# Patient Record
Sex: Female | Born: 1950
Health system: Southern US, Community
[De-identification: ages and names within clinical notes are randomized; demographics above are authoritative.]

## PROBLEM LIST (undated history)

## (undated) DIAGNOSIS — I10 Essential (primary) hypertension: Secondary | ICD-10-CM

## (undated) DIAGNOSIS — F419 Anxiety disorder, unspecified: Secondary | ICD-10-CM

## (undated) DIAGNOSIS — K219 Gastro-esophageal reflux disease without esophagitis: Secondary | ICD-10-CM

## (undated) DIAGNOSIS — G709 Myoneural disorder, unspecified: Secondary | ICD-10-CM

## (undated) DIAGNOSIS — F32A Depression, unspecified: Secondary | ICD-10-CM

## (undated) DIAGNOSIS — K589 Irritable bowel syndrome without diarrhea: Secondary | ICD-10-CM

## (undated) DIAGNOSIS — F329 Major depressive disorder, single episode, unspecified: Secondary | ICD-10-CM

## (undated) DIAGNOSIS — E119 Type 2 diabetes mellitus without complications: Secondary | ICD-10-CM

## (undated) DIAGNOSIS — I4891 Unspecified atrial fibrillation: Secondary | ICD-10-CM

## (undated) HISTORY — PX: SPINE SURGERY: SHX786

## (undated) HISTORY — DX: Major depressive disorder, single episode, unspecified: F32.9

## (undated) HISTORY — PX: CHOLECYSTECTOMY: SHX55

## (undated) HISTORY — DX: Essential (primary) hypertension: I10

## (undated) HISTORY — PX: TUBAL LIGATION: SHX77

## (undated) HISTORY — DX: Myoneural disorder, unspecified: G70.9

## (undated) HISTORY — DX: Gastro-esophageal reflux disease without esophagitis: K21.9

## (undated) HISTORY — PX: BREAST EXCISIONAL BIOPSY: SUR124

## (undated) HISTORY — DX: Depression, unspecified: F32.A

## (undated) HISTORY — DX: Anxiety disorder, unspecified: F41.9

## (undated) MED FILL — Losartan Potassium Tab 25 MG: ORAL | Fill #0 | Status: CN

## (undated) MED FILL — Pantoprazole Sodium EC Tab 40 MG (Base Equiv): ORAL | Fill #0 | Status: CN

## (undated) MED FILL — Metformin HCl Tab ER 24HR 500 MG: ORAL | Fill #0 | Status: CN

---

## 1971-04-17 HISTORY — PX: EXCISION / BIOPSY BREAST / NIPPLE / DUCT: SUR469

## 2005-12-05 ENCOUNTER — Ambulatory Visit: Payer: Self-pay

## 2006-07-17 ENCOUNTER — Ambulatory Visit: Payer: Self-pay | Admitting: Internal Medicine

## 2006-08-15 ENCOUNTER — Ambulatory Visit: Payer: Self-pay | Admitting: Internal Medicine

## 2006-10-16 ENCOUNTER — Ambulatory Visit: Payer: Self-pay | Admitting: Family Medicine

## 2006-11-15 ENCOUNTER — Ambulatory Visit: Payer: Self-pay | Admitting: Family Medicine

## 2007-04-29 ENCOUNTER — Ambulatory Visit: Payer: Self-pay | Admitting: Internal Medicine

## 2008-07-02 ENCOUNTER — Emergency Department: Payer: Self-pay | Admitting: Emergency Medicine

## 2008-11-11 ENCOUNTER — Ambulatory Visit: Payer: Self-pay | Admitting: Unknown Physician Specialty

## 2012-03-28 ENCOUNTER — Ambulatory Visit: Payer: Self-pay

## 2012-04-02 ENCOUNTER — Ambulatory Visit: Payer: Self-pay | Admitting: Internal Medicine

## 2012-07-16 ENCOUNTER — Other Ambulatory Visit: Payer: Self-pay

## 2012-07-16 LAB — URINALYSIS, COMPLETE
Bilirubin,UR: NEGATIVE
Glucose,UR: NEGATIVE mg/dL (ref 0–75)
Ketone: NEGATIVE
Nitrite: NEGATIVE
Ph: 6 (ref 4.5–8.0)
Protein: NEGATIVE
Specific Gravity: 1.017 (ref 1.003–1.030)

## 2012-07-16 LAB — CBC WITH DIFFERENTIAL/PLATELET
Basophil %: 0.8 %
Eosinophil #: 0.2 10*3/uL (ref 0.0–0.7)
Eosinophil %: 1.8 %
HCT: 43 % (ref 35.0–47.0)
HGB: 14.3 g/dL (ref 12.0–16.0)
Lymphocyte #: 1.9 10*3/uL (ref 1.0–3.6)
MCH: 29.8 pg (ref 26.0–34.0)
MCV: 90 fL (ref 80–100)
Monocyte %: 11.1 %
Neutrophil #: 5.8 10*3/uL (ref 1.4–6.5)
Neutrophil %: 64.9 %
Platelet: 276 10*3/uL (ref 150–440)
WBC: 8.9 10*3/uL (ref 3.6–11.0)

## 2012-07-16 LAB — COMPREHENSIVE METABOLIC PANEL
Albumin: 3 g/dL — ABNORMAL LOW (ref 3.4–5.0)
Alkaline Phosphatase: 67 U/L (ref 50–136)
Bilirubin,Total: 0.4 mg/dL (ref 0.2–1.0)
Calcium, Total: 9 mg/dL (ref 8.5–10.1)
Chloride: 101 mmol/L (ref 98–107)
Co2: 24 mmol/L (ref 21–32)
Creatinine: 0.82 mg/dL (ref 0.60–1.30)
EGFR (African American): >60
EGFR (Non-African Amer.): >60
Glucose: 240 mg/dL — ABNORMAL HIGH (ref 65–99)
Potassium: 3.6 mmol/L (ref 3.5–5.1)
SGPT (ALT): 81 U/L — ABNORMAL HIGH (ref 12–78)

## 2012-07-17 ENCOUNTER — Ambulatory Visit: Payer: Self-pay

## 2012-07-17 LAB — OCCULT BLOOD X 1 CARD TO LAB, STOOL: Occult Blood, Feces: NEGATIVE

## 2012-07-19 LAB — STOOL CULTURE

## 2013-05-15 ENCOUNTER — Ambulatory Visit: Payer: Self-pay

## 2013-08-03 ENCOUNTER — Other Ambulatory Visit: Payer: Self-pay

## 2013-08-03 LAB — COMPREHENSIVE METABOLIC PANEL
ALBUMIN: 3.5 g/dL (ref 3.4–5.0)
ALT: 42 U/L (ref 12–78)
AST: 45 U/L — AB (ref 15–37)
Alkaline Phosphatase: 60 U/L
Anion Gap: 5 — ABNORMAL LOW (ref 7–16)
BILIRUBIN TOTAL: 0.4 mg/dL (ref 0.2–1.0)
BUN: 10 mg/dL (ref 7–18)
CALCIUM: 8.8 mg/dL (ref 8.5–10.1)
CHLORIDE: 100 mmol/L (ref 98–107)
CREATININE: 0.7 mg/dL (ref 0.60–1.30)
Co2: 29 mmol/L (ref 21–32)
EGFR (African American): >60
EGFR (Non-African Amer.): >60
Glucose: 135 mg/dL — ABNORMAL HIGH (ref 65–99)
Osmolality: 269 (ref 275–301)
POTASSIUM: 3.5 mmol/L (ref 3.5–5.1)
SODIUM: 134 mmol/L — AB (ref 136–145)
TOTAL PROTEIN: 7.9 g/dL (ref 6.4–8.2)

## 2013-08-03 LAB — CBC WITH DIFFERENTIAL/PLATELET
Basophil #: 0.1 10*3/uL (ref 0.0–0.1)
Basophil %: 1 %
EOS PCT: 0.2 %
Eosinophil #: 0 10*3/uL (ref 0.0–0.7)
HCT: 42.8 % (ref 35.0–47.0)
HGB: 14.2 g/dL (ref 12.0–16.0)
Lymphocyte #: 1.5 10*3/uL (ref 1.0–3.6)
Lymphocyte %: 16.4 %
MCH: 29.7 pg (ref 26.0–34.0)
MCHC: 33.2 g/dL (ref 32.0–36.0)
MCV: 90 fL (ref 80–100)
MONO ABS: 1 x10 3/mm — AB (ref 0.2–0.9)
Monocyte %: 10.5 %
NEUTROS PCT: 71.9 %
Neutrophil #: 6.7 10*3/uL — ABNORMAL HIGH (ref 1.4–6.5)
Platelet: 154 10*3/uL (ref 150–440)
RBC: 4.78 10*6/uL (ref 3.80–5.20)
RDW: 13 % (ref 11.5–14.5)
WBC: 9.3 10*3/uL (ref 3.6–11.0)

## 2014-02-11 ENCOUNTER — Ambulatory Visit: Payer: Self-pay | Admitting: Unknown Physician Specialty

## 2014-08-09 LAB — SURGICAL PATHOLOGY

## 2014-12-17 DIAGNOSIS — E1165 Type 2 diabetes mellitus with hyperglycemia: Secondary | ICD-10-CM | POA: Diagnosis not present

## 2014-12-17 DIAGNOSIS — Z0001 Encounter for general adult medical examination with abnormal findings: Secondary | ICD-10-CM | POA: Diagnosis not present

## 2014-12-17 DIAGNOSIS — E559 Vitamin D deficiency, unspecified: Secondary | ICD-10-CM | POA: Diagnosis not present

## 2014-12-21 DIAGNOSIS — F329 Major depressive disorder, single episode, unspecified: Secondary | ICD-10-CM | POA: Diagnosis not present

## 2014-12-21 DIAGNOSIS — I10 Essential (primary) hypertension: Secondary | ICD-10-CM | POA: Diagnosis not present

## 2014-12-21 DIAGNOSIS — E1165 Type 2 diabetes mellitus with hyperglycemia: Secondary | ICD-10-CM | POA: Diagnosis not present

## 2014-12-21 DIAGNOSIS — B373 Candidiasis of vulva and vagina: Secondary | ICD-10-CM | POA: Diagnosis not present

## 2014-12-21 DIAGNOSIS — B370 Candidal stomatitis: Secondary | ICD-10-CM | POA: Diagnosis not present

## 2014-12-22 ENCOUNTER — Other Ambulatory Visit: Payer: Self-pay | Admitting: Nurse Practitioner

## 2014-12-22 DIAGNOSIS — Z1231 Encounter for screening mammogram for malignant neoplasm of breast: Secondary | ICD-10-CM

## 2014-12-23 ENCOUNTER — Ambulatory Visit: Payer: Self-pay | Admitting: *Deleted

## 2014-12-28 ENCOUNTER — Encounter: Payer: Self-pay | Admitting: Dietician

## 2014-12-28 ENCOUNTER — Encounter: Payer: Medicare Other | Attending: Nurse Practitioner | Admitting: Dietician

## 2014-12-28 VITALS — BP 128/86 | Ht 65.0 in | Wt 239.5 lb

## 2014-12-28 DIAGNOSIS — E119 Type 2 diabetes mellitus without complications: Secondary | ICD-10-CM

## 2014-12-28 NOTE — Progress Notes (Signed)
Diabetes Self-Management Education  Visit Type: First/Initial  Appt. Start Time:1030  Appt. End Time: 2993  12/28/2014  [name], identified by name and date of birth, is a 64 y.o. female with a diagnosis of Diabetes: Type 2.   ASSESSMENT   Pt is obese  Pt states that she "feels like getting diabetes is all her fault" -pt became teary   Reports that her husband is not supportive and blames her for getting diabetes  c/o blurred vision which is improving slightly  c/o constant numbness in bottoms of both feet  Reports having GI upset since starting Janumet which is resolving some-takes Janumet without food  Blood pressure 128/86, height 5\' 5"  (1.651 m), weight 239 lb 8 oz (108.636 kg). Body mass index is 39.85 kg/(m^2).      Diabetes Self-Management Education - 12/28/14 1416    Visit Information   Visit Type First/Initial   Initial Visit   Diabetes Type Type 2   Health Coping   How would you rate your overall health? Fair   Psychosocial Assessment   Patient Belief/Attitude about Diabetes Motivated to manage diabetes   Self-care barriers None   Other persons present Patient   Patient Concerns Nutrition/Meal planning;Medication;Weight Control;Glycemic Control;Healthy Lifestyle   Special Needs None   Preferred Learning Style Hands on   Learning Readiness Ready   What is the last grade level you completed in school? 12   Complications   How often do you check your blood sugar? --  1x/day-fasting   Fasting Blood glucose range (mg/dL) 70-129;130-179  120's-150's   Have you had a dilated eye exam in the past 12 months? No   Have you had a dental exam in the past 12 months? Yes   Are you checking your feet? Yes   How many days per week are you checking your feet? 2   Dietary Intake   Breakfast --  eats breakfast at 10a-eats fried foods and snack foods 2-3x/wk.   Lunch --  occasionally skips lunch   Snack (afternoon) --  eats popcorn (skinny pop) about 2p   Beverage(s) --  drinks 2 glasses of fruit juices daily    Exercise   Exercise Type ADL's  exercise limited due to recent  right knee injury causing pain    Patient Education   Previous Diabetes Education No   Disease state  Definition of diabetes, type 1 and 2, and the diagnosis of diabetes;Factors that contribute to the development of diabetes   Nutrition management  Role of diet in the treatment of diabetes and the relationship between the three main macronutrients and blood glucose level;Food label reading, portion sizes and measuring food.;Carbohydrate counting;Meal timing in regards to the patients' current diabetes medication.   Physical activity and exercise  Role of exercise on diabetes management, blood pressure control and cardiac health.   Medications Reviewed patients medication for diabetes, action, purpose, timing of dose and side effects.   Monitoring Purpose and frequency of SMBG.;Taught/discussed recording of test results and interpretation of SMBG.;Yearly dilated eye exam;Identified appropriate SMBG and/or A1C goals.   Chronic complications Relationship between chronic complications and blood glucose control;Dental care;Retinopathy and reason for yearly dilated eye exams   Psychosocial adjustment Role of stress on diabetes   Personal strategies to promote health Lifestyle issues that need to be addressed for better diabetes care      Individualized Plan for Diabetes Self-Management Training:   Learning Objective:  Patient will have a greater understanding of diabetes self-management. Patient  education plan is to attend individual and group sessions per assessed needs and concerns.    Plan:   Patient Instructions   Check blood sugars 2 x day before breakfast and 2 hrs after supper every day and record  Exercise: try water aerobics if able  Avoid sugar sweetened drinks (soda, tea, coffee, sports drinks, fruit juices)-try Healthy Balance drinks  Eat 3 meals day,   1   snack a day at bedtime  Space meals 4-6 hours apart  Make an eye doctor appointment once vision is stabilized  Bring blood sugar records to the next appointment/class  Get a Sharps container  Return for appointment/classes on:  01-17-15  Take Janumet with breakfast and supper  Call [facility] to schedule appt. with a counselor   Call if questions arise  Expected Outcomes:     Education material provided: General Meal Planning guidelines; pt requests appt. with a counselor and was given phone number for [facility] (also notified [facility])  If problems or questions, patient to contact team via:  [phone]  Future DSME appointment:  01-17-15

## 2014-12-28 NOTE — Patient Instructions (Signed)
  Check blood sugars 2 x day before breakfast and 2 hrs after supper every day and record  Exercise: try water aerobics if able  Avoid sugar sweetened drinks (soda, tea, coffee, sports drinks, fruit juices)-try Healthy Balance apple drink  Eat 3 meals day,   1  snacks a day at bedtime  Space meals 4-6 hours apart  Make an eye doctor appointment  Bring blood sugar records to the next appointment/class  Get a Sharps container  Return for appointment/classes on:  01-17-15

## 2014-12-30 ENCOUNTER — Ambulatory Visit: Payer: Self-pay

## 2015-01-04 DIAGNOSIS — H53009 Unspecified amblyopia, unspecified eye: Secondary | ICD-10-CM | POA: Diagnosis not present

## 2015-01-04 DIAGNOSIS — Z0000 Encounter for general adult medical examination without abnormal findings: Secondary | ICD-10-CM | POA: Diagnosis not present

## 2015-01-04 DIAGNOSIS — F411 Generalized anxiety disorder: Secondary | ICD-10-CM | POA: Diagnosis not present

## 2015-01-04 DIAGNOSIS — F329 Major depressive disorder, single episode, unspecified: Secondary | ICD-10-CM | POA: Diagnosis not present

## 2015-01-04 DIAGNOSIS — Z0001 Encounter for general adult medical examination with abnormal findings: Secondary | ICD-10-CM | POA: Diagnosis not present

## 2015-01-04 DIAGNOSIS — E1165 Type 2 diabetes mellitus with hyperglycemia: Secondary | ICD-10-CM | POA: Diagnosis not present

## 2015-01-04 DIAGNOSIS — I10 Essential (primary) hypertension: Secondary | ICD-10-CM | POA: Diagnosis not present

## 2015-01-06 ENCOUNTER — Ambulatory Visit: Payer: Medicare Other

## 2015-01-17 ENCOUNTER — Encounter: Payer: Self-pay | Admitting: Dietician

## 2015-01-17 ENCOUNTER — Ambulatory Visit: Payer: Medicare Other

## 2015-01-17 NOTE — Progress Notes (Signed)
Pt called and cancelled class 1 tonight. Rescheduled classes beginning with class 1 on 01-27-15

## 2015-01-24 ENCOUNTER — Ambulatory Visit: Payer: Medicare Other

## 2015-01-27 ENCOUNTER — Encounter: Payer: Medicare Other | Attending: Nurse Practitioner | Admitting: Dietician

## 2015-01-27 VITALS — Ht 65.0 in | Wt 232.6 lb

## 2015-01-27 DIAGNOSIS — E119 Type 2 diabetes mellitus without complications: Secondary | ICD-10-CM | POA: Insufficient documentation

## 2015-01-27 NOTE — Progress Notes (Signed)

## 2015-01-31 ENCOUNTER — Ambulatory Visit: Payer: Medicare Other

## 2015-02-03 ENCOUNTER — Encounter: Payer: Medicare Other | Admitting: Dietician

## 2015-02-03 ENCOUNTER — Encounter: Payer: Self-pay | Admitting: Dietician

## 2015-02-03 ENCOUNTER — Ambulatory Visit: Payer: Medicare Other

## 2015-02-03 VITALS — Wt 231.4 lb

## 2015-02-03 DIAGNOSIS — E1140 Type 2 diabetes mellitus with diabetic neuropathy, unspecified: Secondary | ICD-10-CM

## 2015-02-03 DIAGNOSIS — E119 Type 2 diabetes mellitus without complications: Secondary | ICD-10-CM | POA: Diagnosis not present

## 2015-02-03 NOTE — Progress Notes (Addendum)
RD provided pt with diet  information on gastroparesis. Pt teary and reports feeling depressed and overwhelmed with diabetes-says her husband blames her for getting diabetes and is not supportive. Pt was given phone number for [facility] at initial visit but has not called. Encouraged pt to call [facility] and set up appointment for counseling.

## 2015-02-03 NOTE — Progress Notes (Signed)

## 2015-02-10 ENCOUNTER — Encounter: Payer: Medicare Other | Admitting: Dietician

## 2015-02-10 VITALS — Ht 65.0 in | Wt 232.1 lb

## 2015-02-10 DIAGNOSIS — E1140 Type 2 diabetes mellitus with diabetic neuropathy, unspecified: Secondary | ICD-10-CM

## 2015-02-10 DIAGNOSIS — E119 Type 2 diabetes mellitus without complications: Secondary | ICD-10-CM | POA: Diagnosis not present

## 2015-02-10 NOTE — Progress Notes (Signed)

## 2015-02-15 ENCOUNTER — Encounter: Payer: Self-pay | Admitting: Dietician

## 2015-02-18 DIAGNOSIS — E1165 Type 2 diabetes mellitus with hyperglycemia: Secondary | ICD-10-CM | POA: Diagnosis not present

## 2015-02-18 DIAGNOSIS — I10 Essential (primary) hypertension: Secondary | ICD-10-CM | POA: Diagnosis not present

## 2015-02-18 DIAGNOSIS — F411 Generalized anxiety disorder: Secondary | ICD-10-CM | POA: Diagnosis not present

## 2015-02-18 DIAGNOSIS — K649 Unspecified hemorrhoids: Secondary | ICD-10-CM | POA: Diagnosis not present

## 2015-02-18 DIAGNOSIS — F329 Major depressive disorder, single episode, unspecified: Secondary | ICD-10-CM | POA: Diagnosis not present

## 2015-03-02 ENCOUNTER — Ambulatory Visit: Payer: Medicare Other

## 2015-03-07 DIAGNOSIS — E119 Type 2 diabetes mellitus without complications: Secondary | ICD-10-CM | POA: Diagnosis not present

## 2015-03-15 DIAGNOSIS — E2839 Other primary ovarian failure: Secondary | ICD-10-CM | POA: Diagnosis not present

## 2015-03-15 DIAGNOSIS — M85832 Other specified disorders of bone density and structure, left forearm: Secondary | ICD-10-CM | POA: Diagnosis not present

## 2015-03-15 DIAGNOSIS — Z13820 Encounter for screening for osteoporosis: Secondary | ICD-10-CM | POA: Diagnosis not present

## 2015-03-15 DIAGNOSIS — M8588 Other specified disorders of bone density and structure, other site: Secondary | ICD-10-CM | POA: Diagnosis not present

## 2015-03-15 DIAGNOSIS — M85852 Other specified disorders of bone density and structure, left thigh: Secondary | ICD-10-CM | POA: Diagnosis not present

## 2015-03-15 DIAGNOSIS — M85862 Other specified disorders of bone density and structure, left lower leg: Secondary | ICD-10-CM | POA: Diagnosis not present

## 2015-03-15 DIAGNOSIS — N958 Other specified menopausal and perimenopausal disorders: Secondary | ICD-10-CM | POA: Diagnosis not present

## 2015-03-18 DIAGNOSIS — D2311 Other benign neoplasm of skin of right eyelid, including canthus: Secondary | ICD-10-CM | POA: Diagnosis not present

## 2015-03-23 ENCOUNTER — Ambulatory Visit: Payer: Medicare Other

## 2015-05-16 ENCOUNTER — Ambulatory Visit: Payer: Medicare Other

## 2015-05-19 ENCOUNTER — Other Ambulatory Visit: Payer: Self-pay | Admitting: Nurse Practitioner

## 2015-05-19 ENCOUNTER — Ambulatory Visit
Admission: RE | Admit: 2015-05-19 | Discharge: 2015-05-19 | Disposition: A | Discharge status: Home / Self Care | Payer: Medicare Other | Source: Ambulatory Visit | Attending: Nurse Practitioner | Admitting: Nurse Practitioner

## 2015-05-19 DIAGNOSIS — Z1231 Encounter for screening mammogram for malignant neoplasm of breast: Secondary | ICD-10-CM

## 2015-05-26 DIAGNOSIS — K5900 Constipation, unspecified: Secondary | ICD-10-CM | POA: Diagnosis not present

## 2015-05-26 DIAGNOSIS — E1165 Type 2 diabetes mellitus with hyperglycemia: Secondary | ICD-10-CM | POA: Diagnosis not present

## 2015-05-26 DIAGNOSIS — I10 Essential (primary) hypertension: Secondary | ICD-10-CM | POA: Diagnosis not present

## 2015-05-26 DIAGNOSIS — L630 Alopecia (capitis) totalis: Secondary | ICD-10-CM | POA: Diagnosis not present

## 2015-06-08 DIAGNOSIS — L630 Alopecia (capitis) totalis: Secondary | ICD-10-CM | POA: Diagnosis not present

## 2015-06-10 DIAGNOSIS — M170 Bilateral primary osteoarthritis of knee: Secondary | ICD-10-CM | POA: Diagnosis not present

## 2015-06-10 DIAGNOSIS — M25562 Pain in left knee: Secondary | ICD-10-CM | POA: Diagnosis not present

## 2015-07-14 DIAGNOSIS — E1165 Type 2 diabetes mellitus with hyperglycemia: Secondary | ICD-10-CM | POA: Diagnosis not present

## 2015-07-14 DIAGNOSIS — I10 Essential (primary) hypertension: Secondary | ICD-10-CM | POA: Diagnosis not present

## 2015-07-14 DIAGNOSIS — R10817 Generalized abdominal tenderness: Secondary | ICD-10-CM | POA: Diagnosis not present

## 2015-07-14 DIAGNOSIS — R197 Diarrhea, unspecified: Secondary | ICD-10-CM | POA: Diagnosis not present

## 2015-08-16 DIAGNOSIS — J301 Allergic rhinitis due to pollen: Secondary | ICD-10-CM | POA: Diagnosis not present

## 2015-08-16 DIAGNOSIS — J309 Allergic rhinitis, unspecified: Secondary | ICD-10-CM | POA: Diagnosis not present

## 2015-08-16 DIAGNOSIS — J019 Acute sinusitis, unspecified: Secondary | ICD-10-CM | POA: Diagnosis not present

## 2015-08-31 DIAGNOSIS — J309 Allergic rhinitis, unspecified: Secondary | ICD-10-CM | POA: Diagnosis not present

## 2015-08-31 DIAGNOSIS — J019 Acute sinusitis, unspecified: Secondary | ICD-10-CM | POA: Diagnosis not present

## 2016-01-25 DIAGNOSIS — M170 Bilateral primary osteoarthritis of knee: Secondary | ICD-10-CM | POA: Diagnosis not present

## 2016-01-25 DIAGNOSIS — M65332 Trigger finger, left middle finger: Secondary | ICD-10-CM | POA: Diagnosis not present

## 2016-02-01 DIAGNOSIS — M65332 Trigger finger, left middle finger: Secondary | ICD-10-CM | POA: Diagnosis not present

## 2016-02-03 DIAGNOSIS — R350 Frequency of micturition: Secondary | ICD-10-CM | POA: Diagnosis not present

## 2016-02-03 DIAGNOSIS — J019 Acute sinusitis, unspecified: Secondary | ICD-10-CM | POA: Diagnosis not present

## 2016-02-03 DIAGNOSIS — I10 Essential (primary) hypertension: Secondary | ICD-10-CM | POA: Diagnosis not present

## 2016-02-03 DIAGNOSIS — E1165 Type 2 diabetes mellitus with hyperglycemia: Secondary | ICD-10-CM | POA: Diagnosis not present

## 2016-02-21 ENCOUNTER — Ambulatory Visit: Payer: Medicare Other | Admitting: Dietician

## 2016-02-28 ENCOUNTER — Encounter: Payer: Self-pay | Admitting: Dietician

## 2016-02-28 ENCOUNTER — Encounter: Payer: Medicare Other | Attending: Nurse Practitioner | Admitting: Dietician

## 2016-02-28 VITALS — Ht 66.0 in | Wt 232.8 lb

## 2016-02-28 DIAGNOSIS — E669 Obesity, unspecified: Secondary | ICD-10-CM | POA: Insufficient documentation

## 2016-02-28 DIAGNOSIS — Z713 Dietary counseling and surveillance: Secondary | ICD-10-CM | POA: Diagnosis not present

## 2016-02-28 DIAGNOSIS — E1149 Type 2 diabetes mellitus with other diabetic neurological complication: Secondary | ICD-10-CM

## 2016-02-28 DIAGNOSIS — I10 Essential (primary) hypertension: Secondary | ICD-10-CM | POA: Diagnosis not present

## 2016-02-28 DIAGNOSIS — E119 Type 2 diabetes mellitus without complications: Secondary | ICD-10-CM | POA: Diagnosis not present

## 2016-02-28 NOTE — Progress Notes (Signed)
Diabetes Self-Management Education  Visit Type: First/Initial  Appt. Start Time: 1030 Appt. End Time: 1130  02/28/2016  [name], identified by name and date of birth, is a 65 y.o. female with a diagnosis of Diabetes: Type 2.   ASSESSMENT  Height 5\' 6"  (1.676 m), weight 232 lb 12.8 oz (105.6 kg). Body mass index is 37.57 kg/m. Overweight     Diabetes Self-Management Education - 02/28/16 1300      Visit Information   Visit Type First/Initial     Initial Visit   Diabetes Type Type 2     Health Coping   How would you rate your overall health? Good     Psychosocial Assessment   Patient Belief/Attitude about Diabetes Motivated to manage diabetes   Self-care barriers --  pt depressed and anxious   Patient Concerns Glycemic Control;Medication;Weight Control;Nutrition/Meal planning;Healthy Lifestyle  prevent complications, become more fit   Special Needs None   Preferred Learning Style Hands on   Learning Readiness Ready     Pre-Education Assessment   Patient understands the diabetes disease and treatment process. Needs Review   Patient understands incorporating nutritional management into lifestyle. Needs Review   Patient undertands incorporating physical activity into lifestyle. Needs Review   Patient understands using medications safely. Needs Review  c/o runny nose, stopped up and scratchy eyes since being on Januvia; pt says Bupropion is not helping depression   Patient understands monitoring blood glucose, interpreting and using results Needs Review   Patient understands prevention, detection, and treatment of acute complications. Needs Review   Patient understands prevention, detection, and treatment of chronic complications. Needs Review   Patient understands how to develop strategies to address psychosocial issues. Needs Review  c/o anxiety and depression-frustrated with managing diabetes   Patient understands how to develop strategies to promote  health/change behavior. Needs Review     Complications   How often do you check your blood sugar? 1-2 times/day   Fasting Blood glucose range (mg/dL) 70-129   Have you had a dilated eye exam in the past 12 months? Yes  1 year ago   Have you had a dental exam in the past 12 months? Yes  4 months ago   Are you checking your feet? Yes   How many days per week are you checking your feet? 7     Dietary Intake   Breakfast --  eats breakfast at 10a-usually eats oatmeal and 1 piece toast or 1 egg with 1 toast   Snack (morning) --  east occasional snack of crackers + pnut butter   Lunch --  eats lunch at 2p   Snack (afternoon) --  eats occasional sanck  at 5p=crackers and peanut butter   Dinner --  eats supper at 6p   Beverage(s) --  drinks water 6-7x/day and diet drinks 2-3x/day +milk 2x/day     Exercise   Exercise Type Light (walking / raking leaves)   How many days per week to you exercise? --  4+   How many minutes per day do you exercise? 30     Patient Education   Previous Diabetes Education Yes (please comment)   Disease state  Explored patient's options for treatment of their diabetes   Nutrition management  Role of diet in the treatment of diabetes and the relationship between the three main macronutrients and blood glucose level;Food label reading, portion sizes and measuring food.;Carbohydrate counting   Physical activity and exercise  Role of exercise on diabetes management, blood  pressure control and cardiac health.;Helped patient identify appropriate exercises in relation to his/her diabetes, diabetes complications and other health issue.   Medications Reviewed patients medication for diabetes, action, purpose, timing of dose and side effects.  advised to contact PCP about depression and Bupropion not helping; also advised to contact PCP about URI symptoms and Januvia   Monitoring Yearly dilated eye exam;Daily foot exams;Identified appropriate SMBG and/or A1C  goals.;Taught/discussed recording of test results and interpretation of SMBG.   Chronic complications Assessed and discussed foot care and prevention of foot problems;Retinopathy and reason for yearly dilated eye exams;Dental care;Relationship between chronic complications and blood glucose control   Psychosocial adjustment Role of stress on diabetes;Identified and addressed patients feelings and concerns about diabetes  pt anxious and depressed-crying at times during visit-notified [facility] to schedule appointment with a counselor   Personal strategies to promote health Lifestyle issues that need to be addressed for better diabetes care;Helped patient develop diabetes management plan for (enter comment)      Individualized Plan for Diabetes Self-Management Training:   Learning Objective:  Patient will have a greater understanding of diabetes self-management. Patient education plan is to attend individual and/or group sessions per assessed needs and concerns.   Plan:   Patient Instructions   Check blood sugars 2 x day before breakfast and 2 hrs after supper every day  Exercise:  Continue to walk  for    30  minutes  5 days a week  Avoid sugar sweetened drinks (soda, tea, coffee, sports drinks, juices)  Limit intake of sweets and fried foods  Eat 3 meals day,   1  snacks a day at bedtime  Eat 2-3 carbohydrate servings/meal + protein  Eat  1 carbohydrate serving/snack + protein  Space meals 4-5 hours apart  Complete 3 Day Food Record and bring to next appt  Make eye doctor appointment  Bring blood sugar records to the next appointment/class  Return for appointment/classes on:  03-19-16  Call if questions [phone]   Expected Outcomes:    positive Education material provided: General meal planning guidelines, Living Well With Diabetes booklet, A1C handout, foot care handout, depress/diabetes handout, exercise DVD, exercise booklet  If problems or questions, patient to  contact team via:  [phone]  Future DSME appointment:  03-19-16

## 2016-02-28 NOTE — Patient Instructions (Signed)
  Check blood sugars 2 x day before breakfast and 2 hrs after supper every day  Exercise:  Continue to walk  for    30  minutes  5 days a week  Avoid sugar sweetened drinks (soda, tea, coffee, sports drinks, juices)  Limit intake of sweets and fried foods  Eat 3 meals day,   1  snacks a day at bedtime  Eat 2-3 carbohydrate servings/meal + protein  Eat  1 carbohydrate serving/snack + protein  Space meals 4-5 hours apart  Complete 3 Day Food Record and bring to next appt  Make eye doctor appointment  Bring blood sugar records to the next appointment/class  Return for appointment/classes on:  03-19-16  Call if questions [phone]

## 2016-03-14 DIAGNOSIS — M3501 Sicca syndrome with keratoconjunctivitis: Secondary | ICD-10-CM | POA: Diagnosis not present

## 2016-03-19 ENCOUNTER — Ambulatory Visit: Payer: Medicare Other | Admitting: Dietician

## 2016-04-02 ENCOUNTER — Ambulatory Visit: Payer: Medicare Other | Admitting: Dietician

## 2016-04-02 DIAGNOSIS — M1711 Unilateral primary osteoarthritis, right knee: Secondary | ICD-10-CM | POA: Diagnosis not present

## 2016-04-03 ENCOUNTER — Encounter: Payer: Self-pay | Admitting: Dietician

## 2016-04-03 ENCOUNTER — Encounter: Payer: Medicare Other | Attending: Nurse Practitioner | Admitting: Dietician

## 2016-04-03 VITALS — Ht 66.0 in | Wt 232.9 lb

## 2016-04-03 DIAGNOSIS — Z713 Dietary counseling and surveillance: Secondary | ICD-10-CM | POA: Insufficient documentation

## 2016-04-03 DIAGNOSIS — E669 Obesity, unspecified: Secondary | ICD-10-CM | POA: Insufficient documentation

## 2016-04-03 DIAGNOSIS — E119 Type 2 diabetes mellitus without complications: Secondary | ICD-10-CM | POA: Insufficient documentation

## 2016-04-03 DIAGNOSIS — I10 Essential (primary) hypertension: Secondary | ICD-10-CM | POA: Insufficient documentation

## 2016-04-03 DIAGNOSIS — E1140 Type 2 diabetes mellitus with diabetic neuropathy, unspecified: Secondary | ICD-10-CM

## 2016-04-03 NOTE — Progress Notes (Signed)
Diabetes Self-Management Education  Visit Type:  Follow-up  Appt. Start Time: 1030 Appt. End Time: 1200  04/03/2016  [name], identified by name and date of birth, is a 65 y.o. female with a diagnosis of Diabetes:  .   ASSESSMENT  Height 5\' 6"  (1.676 m), weight 232 lb 14.4 oz (105.6 kg). Body mass index is 37.59 kg/m.       Diabetes Self-Management Education - [id number]      Complications   How often do you check your blood sugar? 1-2 times/day   Fasting Blood glucose range (mg/dL) 70-129   Have you had a dilated eye exam in the past 12 months? No   Have you had a dental exam in the past 12 months? No   Are you checking your feet? Yes   How many days per week are you checking your feet? 7     Dietary Intake   Breakfast 2-3 meals and 0-2 snacks daily     Exercise   Exercise Type ADL's  unable to exercise for past several weeks due to knee     Patient Education   Nutrition management  Role of diet in the treatment of diabetes and the relationship between the three main macronutrients and blood glucose level;Meal timing in regards to the patients' current diabetes medication.;Meal options for control of blood glucose level and chronic complications.;Other (comment)  suitable foods for restricted diet due to GI issues   Physical activity and exercise  Role of exercise on diabetes management, blood pressure control and cardiac health.  role of exercise in managing depression   Acute complications Covered sick day management with medication and food.   Psychosocial adjustment Worked with patient to identify barriers to care and solutions;Role of stress on diabetes   Personal strategies to promote health Lifestyle issues that need to be addressed for better diabetes care  depression, lack of family support     Post-Education Assessment   Patient understands the diabetes disease and treatment process. Demonstrates understanding / competency   Patient understands  incorporating nutritional management into lifestyle. Demonstrates understanding / competency   Patient undertands incorporating physical activity into lifestyle. Demonstrates understanding / competency   Patient understands using medications safely. Demonstrates understanding / competency   Patient understands monitoring blood glucose, interpreting and using results Demonstrates understanding / competency   Patient understands prevention, detection, and treatment of acute complications. Demonstrates understanding / competency   Patient understands prevention, detection, and treatment of chronic complications. Needs Review   Patient understands how to develop strategies to address psychosocial issues. Needs Review   Patient understands how to develop strategies to promote health/change behavior. Demonstrates understanding / competency     Outcomes   Program Status Completed      Patient voices struggle with depression over the past several months and is averse to trying other antidepressants because she has had weight gain or other adverse reactions in the past. Discussed likelihood that dealing with depression as a top priority might help with energy and motivation to engage in weight-reducing habits.   We also discussed nutrition strategies to help minimize digestive upset. Encouraged her to follow pattern of "sick day" guidelines if necessary to manage symptoms.    Plan:   Patient Instructions   Take a few minutes to relax prior to eating, then eat slowly and chew food thoroughly. Keep mealtimes as low stress and relaxed as possible.   Try a nutrition drink such as Glucerna, Boost low sugar, or  carnation breakfast drink when you don't feel hungry for a meal. Don't force yourself to eat a full meal when not feeling well.   When feeling poorly, eat small portions every 2-3 hours, including soups, crackers, toast, applesauce, pudding or low-sugar pudding, etc.   Patient will discuss  option of additional medical nutrition therapy visits at next MD appointment.  Expected Outcomes:  Demonstrated interest in learning. Expect positive outcomes  Education material provided: Carb counting book [employer]); FODMap diet (if she wants to eliminate foods to monitor GI symptoms); 1400kcal meal plan per patient request; several food diary sheets.  If problems or questions, patient to contact team via:  Phone  Future DSME appointment: -

## 2016-04-03 NOTE — Patient Instructions (Signed)
   Take a few minutes to relax prior to eating, then eat slowly and chew food thoroughly. Keep mealtimes as low stress and relaxed as possible.   Try a nutrition drink such as Glucerna, Boost low sugar, or carnation breakfast drink when you don't feel hungry for a meal. Don't force yourself to eat a full meal when not feeling well.   When feeling poorly, eat small portions every 2-3 hours, including soups, crackers, toast, applesauce, pudding or low-sugar pudding, etc.

## 2016-07-19 DIAGNOSIS — E1165 Type 2 diabetes mellitus with hyperglycemia: Secondary | ICD-10-CM | POA: Diagnosis not present

## 2016-07-19 DIAGNOSIS — R300 Dysuria: Secondary | ICD-10-CM | POA: Diagnosis not present

## 2016-07-19 DIAGNOSIS — N390 Urinary tract infection, site not specified: Secondary | ICD-10-CM | POA: Diagnosis not present

## 2016-07-19 DIAGNOSIS — J019 Acute sinusitis, unspecified: Secondary | ICD-10-CM | POA: Diagnosis not present

## 2016-07-19 DIAGNOSIS — I10 Essential (primary) hypertension: Secondary | ICD-10-CM | POA: Diagnosis not present

## 2016-07-19 DIAGNOSIS — R197 Diarrhea, unspecified: Secondary | ICD-10-CM | POA: Diagnosis not present

## 2016-07-23 ENCOUNTER — Other Ambulatory Visit: Payer: Self-pay | Admitting: Nurse Practitioner

## 2016-07-23 DIAGNOSIS — Z1231 Encounter for screening mammogram for malignant neoplasm of breast: Secondary | ICD-10-CM

## 2016-08-15 ENCOUNTER — Ambulatory Visit
Admission: RE | Admit: 2016-08-15 | Discharge: 2016-08-15 | Disposition: A | Discharge status: Home / Self Care | Payer: Medicare HMO | Source: Ambulatory Visit | Attending: Nurse Practitioner | Admitting: Nurse Practitioner

## 2016-08-15 DIAGNOSIS — Z1231 Encounter for screening mammogram for malignant neoplasm of breast: Secondary | ICD-10-CM | POA: Diagnosis not present

## 2016-09-12 DIAGNOSIS — M549 Dorsalgia, unspecified: Secondary | ICD-10-CM | POA: Diagnosis not present

## 2016-09-12 DIAGNOSIS — S32020A Wedge compression fracture of second lumbar vertebra, initial encounter for closed fracture: Secondary | ICD-10-CM | POA: Diagnosis not present

## 2016-10-03 DIAGNOSIS — S32020D Wedge compression fracture of second lumbar vertebra, subsequent encounter for fracture with routine healing: Secondary | ICD-10-CM | POA: Diagnosis not present

## 2016-10-03 DIAGNOSIS — M549 Dorsalgia, unspecified: Secondary | ICD-10-CM | POA: Diagnosis not present

## 2016-10-11 DIAGNOSIS — B370 Candidal stomatitis: Secondary | ICD-10-CM | POA: Diagnosis not present

## 2016-10-11 DIAGNOSIS — E1165 Type 2 diabetes mellitus with hyperglycemia: Secondary | ICD-10-CM | POA: Diagnosis not present

## 2016-10-11 DIAGNOSIS — K5900 Constipation, unspecified: Secondary | ICD-10-CM | POA: Diagnosis not present

## 2016-10-11 DIAGNOSIS — I10 Essential (primary) hypertension: Secondary | ICD-10-CM | POA: Diagnosis not present

## 2016-10-11 DIAGNOSIS — R10817 Generalized abdominal tenderness: Secondary | ICD-10-CM | POA: Diagnosis not present

## 2016-10-11 DIAGNOSIS — K219 Gastro-esophageal reflux disease without esophagitis: Secondary | ICD-10-CM | POA: Diagnosis not present

## 2016-10-23 DIAGNOSIS — E1165 Type 2 diabetes mellitus with hyperglycemia: Secondary | ICD-10-CM | POA: Diagnosis not present

## 2016-10-23 DIAGNOSIS — K5909 Other constipation: Secondary | ICD-10-CM | POA: Diagnosis not present

## 2016-10-23 DIAGNOSIS — R10817 Generalized abdominal tenderness: Secondary | ICD-10-CM | POA: Diagnosis not present

## 2016-11-04 DIAGNOSIS — R1011 Right upper quadrant pain: Secondary | ICD-10-CM | POA: Diagnosis not present

## 2016-11-06 DIAGNOSIS — K644 Residual hemorrhoidal skin tags: Secondary | ICD-10-CM | POA: Diagnosis not present

## 2016-11-06 DIAGNOSIS — R1011 Right upper quadrant pain: Secondary | ICD-10-CM | POA: Insufficient documentation

## 2016-11-06 DIAGNOSIS — K5909 Other constipation: Secondary | ICD-10-CM | POA: Diagnosis not present

## 2016-11-06 DIAGNOSIS — K581 Irritable bowel syndrome with constipation: Secondary | ICD-10-CM | POA: Insufficient documentation

## 2016-11-23 DIAGNOSIS — K2930 Chronic superficial gastritis without bleeding: Secondary | ICD-10-CM | POA: Diagnosis not present

## 2016-11-23 DIAGNOSIS — K582 Mixed irritable bowel syndrome: Secondary | ICD-10-CM | POA: Diagnosis not present

## 2016-11-23 DIAGNOSIS — R1011 Right upper quadrant pain: Secondary | ICD-10-CM | POA: Diagnosis not present

## 2016-11-23 DIAGNOSIS — K5909 Other constipation: Secondary | ICD-10-CM | POA: Diagnosis not present

## 2016-11-29 DIAGNOSIS — R69 Illness, unspecified: Secondary | ICD-10-CM | POA: Diagnosis not present

## 2016-12-07 ENCOUNTER — Encounter: Payer: Self-pay | Admitting: Emergency Medicine

## 2016-12-07 ENCOUNTER — Emergency Department: Payer: Medicare HMO

## 2016-12-07 ENCOUNTER — Emergency Department
Admission: EM | Admit: 2016-12-07 | Discharge: 2016-12-08 | Disposition: A | Discharge status: Home / Self Care | Payer: Medicare HMO | Attending: Emergency Medicine | Admitting: Emergency Medicine

## 2016-12-07 ENCOUNTER — Other Ambulatory Visit: Payer: Self-pay | Admitting: Nurse Practitioner

## 2016-12-07 DIAGNOSIS — Z79899 Other long term (current) drug therapy: Secondary | ICD-10-CM | POA: Diagnosis not present

## 2016-12-07 DIAGNOSIS — R109 Unspecified abdominal pain: Secondary | ICD-10-CM | POA: Insufficient documentation

## 2016-12-07 DIAGNOSIS — N83201 Unspecified ovarian cyst, right side: Secondary | ICD-10-CM | POA: Diagnosis not present

## 2016-12-07 DIAGNOSIS — R160 Hepatomegaly, not elsewhere classified: Secondary | ICD-10-CM | POA: Diagnosis not present

## 2016-12-07 DIAGNOSIS — Z87891 Personal history of nicotine dependence: Secondary | ICD-10-CM | POA: Insufficient documentation

## 2016-12-07 DIAGNOSIS — I10 Essential (primary) hypertension: Secondary | ICD-10-CM | POA: Insufficient documentation

## 2016-12-07 DIAGNOSIS — E119 Type 2 diabetes mellitus without complications: Secondary | ICD-10-CM | POA: Insufficient documentation

## 2016-12-07 DIAGNOSIS — R1031 Right lower quadrant pain: Secondary | ICD-10-CM

## 2016-12-07 DIAGNOSIS — R1011 Right upper quadrant pain: Secondary | ICD-10-CM

## 2016-12-07 DIAGNOSIS — Z7984 Long term (current) use of oral hypoglycemic drugs: Secondary | ICD-10-CM | POA: Diagnosis not present

## 2016-12-07 DIAGNOSIS — K760 Fatty (change of) liver, not elsewhere classified: Secondary | ICD-10-CM | POA: Diagnosis not present

## 2016-12-07 DIAGNOSIS — D259 Leiomyoma of uterus, unspecified: Secondary | ICD-10-CM | POA: Diagnosis not present

## 2016-12-07 DIAGNOSIS — G8929 Other chronic pain: Secondary | ICD-10-CM

## 2016-12-07 HISTORY — DX: Irritable bowel syndrome, unspecified: K58.9

## 2016-12-07 HISTORY — DX: Type 2 diabetes mellitus without complications: E11.9

## 2016-12-07 LAB — URINALYSIS, COMPLETE (UACMP) WITH MICROSCOPIC
BACTERIA UA: NONE SEEN
BILIRUBIN URINE: NEGATIVE
Glucose, UA: NEGATIVE mg/dL
HGB URINE DIPSTICK: NEGATIVE
Ketones, ur: NEGATIVE mg/dL
LEUKOCYTES UA: NEGATIVE
NITRITE: NEGATIVE
PROTEIN: NEGATIVE mg/dL
SPECIFIC GRAVITY, URINE: 1.02 (ref 1.005–1.030)
pH: 6 (ref 5.0–8.0)

## 2016-12-07 LAB — CBC
HEMATOCRIT: 41.6 % (ref 35.0–47.0)
HEMOGLOBIN: 14.2 g/dL (ref 12.0–16.0)
MCH: 29.8 pg (ref 26.0–34.0)
MCHC: 34 g/dL (ref 32.0–36.0)
MCV: 87.7 fL (ref 80.0–100.0)
Platelets: 224 10*3/uL (ref 150–440)
RBC: 4.75 MIL/uL (ref 3.80–5.20)
RDW: 13.1 % (ref 11.5–14.5)
WBC: 9.5 10*3/uL (ref 3.6–11.0)

## 2016-12-07 LAB — COMPREHENSIVE METABOLIC PANEL
ALK PHOS: 56 U/L (ref 38–126)
ALT: 30 U/L (ref 14–54)
ANION GAP: 8 (ref 5–15)
AST: 30 U/L (ref 15–41)
Albumin: 4.2 g/dL (ref 3.5–5.0)
BILIRUBIN TOTAL: 0.5 mg/dL (ref 0.3–1.2)
BUN: 14 mg/dL (ref 6–20)
CHLORIDE: 102 mmol/L (ref 101–111)
CO2: 27 mmol/L (ref 22–32)
Calcium: 9.9 mg/dL (ref 8.9–10.3)
Creatinine, Ser: 0.64 mg/dL (ref 0.44–1.00)
GFR calc Af Amer: >60 mL/min (ref >60)
GFR calc non Af Amer: >60 mL/min (ref >60)
Glucose, Bld: 114 mg/dL — ABNORMAL HIGH (ref 65–99)
POTASSIUM: 3.3 mmol/L — AB (ref 3.5–5.1)
SODIUM: 137 mmol/L (ref 135–145)
TOTAL PROTEIN: 7.7 g/dL (ref 6.5–8.1)

## 2016-12-07 LAB — LIPASE, BLOOD: Lipase: 38 U/L (ref 11–51)

## 2016-12-07 MED ORDER — IOPAMIDOL (ISOVUE-300) INJECTION 61%
100.0000 mL | Freq: Once | INTRAVENOUS | Status: AC | PRN
  Administered 2016-12-07: 100 mL via INTRAVENOUS

## 2016-12-07 MED ORDER — IOPAMIDOL (ISOVUE-300) INJECTION 61%
30.0000 mL | Freq: Once | INTRAVENOUS | Status: AC
  Administered 2016-12-07: 30 mL via ORAL

## 2016-12-07 NOTE — ED Provider Notes (Signed)
[facility] Emergency Department Provider Note  ____________________________________________   First MD Initiated Contact with Patient 12/07/16 2201     (approximate)  I have reviewed the triage vital signs and the nursing notes.   HISTORY  Chief Complaint Abdominal Pain    HPI Lisa Gutierrez is a 66 y.o. female with a history as listed below who presents for evaluation of months of gradually worsening "gassiness", early satiety and decreased appetite, and sided abdominal pain that is most notable in the right upper quadrant.  Although they have been present for months, the symptoms have gotten worse over this past week.  Her GI doctor is Dr. Vira Agar, and she called his clinic and the nurse practitioner set the patient up for a CT scan with contrast of her abdomen and pelvis for next week and outpatient lab work was performed.  I reviewed the EMR and saw the note from this phone conversation.  They talked about the patient's extreme anxiety over her symptoms and her concern that she has cancer, the patient was crying on the phone, etc.  She is set up for evaluation next week, and the patient knows this, but she came to the emergency department this afternoon because she felt like the symptoms were worse today and she is "freaked out" and terrified that she has pancreatic cancer or some other severe life-threatening condition.  She reports that trying to eat "anything at all" makes her symptoms worse.  Nothing makes them better.  They are severe and constant.  She denies fever/chills, chest pain, shortness of breath, nausea, and vomiting.  She has intermittent bouts of diarrhea and constipation and takes medication for IBS (Linzess).  She has taken sucralfate in the past but it has not helped.  She continues to take pantoprazole.  She had an endoscopy and colonoscopy about 4 years ago and states that she was "burned on the inside" and that is why she was started  on pantoprazole.   Past Medical History:  Diagnosis Date  . Anxiety   . Depression   . Diabetes mellitus without complication ([place])   . GERD (gastroesophageal reflux disease)   . Hypertension   . IBS (irritable bowel syndrome)   . Neuromuscular disorder ([place])     There are no active problems to display for this patient.   Past Surgical History:  Procedure Laterality Date  . CHOLECYSTECTOMY    . EXCISION / BIOPSY BREAST / NIPPLE / DUCT Right 1973   duct removed    Prior to Admission medications   Medication Sig Start Date End Date Taking? Authorizing Provider  acetaminophen (TYLENOL) 500 MG tablet Take 2 tablets by mouth as needed.    [provider]  ALPRAZolam Duanne Moron) 0.5 MG tablet Take 0.5 mg by mouth 2 (two) times daily. As needed    [provider]  BUPROPION HCL ER, SR, PO Take 100 mg by mouth daily.     [provider]  Calcium Citrate-Vitamin D (CALCIUM + D PO) Take 1 tablet by mouth daily.    [provider]  docusate sodium (COLACE) 100 MG capsule Take 3 capsules by mouth daily.    [provider]  Flaxseed, Linseed, (FLAXSEED OIL PO) Take 1,500 mg by mouth 2 (two) times daily.    [provider]  fluticasone (FLONASE) 50 MCG/ACT nasal spray Place 2 sprays into both nostrils daily as needed.     [provider]  gabapentin (NEURONTIN) 300 MG capsule Take 1  capsule by mouth 3 (three) times daily as needed. 06/10/15 06/09/16  [provider]  Linaclotide Rolan Lipa) 145 MCG CAPS capsule Take 145 mcg by mouth as needed. 12/15/13   [provider]  metoprolol succinate (TOPROL-XL) 25 MG 24 hr tablet Take 25 mg by mouth daily.    [provider]  Multiple Vitamins-Minerals (MULTIVITAMIN ADULT PO) Take 1 tablet by mouth daily.    [provider]  pantoprazole (PROTONIX) 40 MG tablet Take 40 mg by mouth 2 (two) times daily.     [provider]  sitaGLIPtin-metformin (JANUMET)  50-500 MG per tablet Take 1 tablet by mouth 2 (two) times daily with a meal.    [provider]  valsartan-hydrochlorothiazide (DIOVAN-HCT) 320-25 MG per tablet Take by mouth daily.    [provider]    Allergies Aspirin; Escitalopram; Ibuprofen; Levofloxacin; Meloxicam; Morphine; Naprosyn  [naproxen]; Nsaids; Quinolones; Robinul  [glycopyrrolate]; Penicillin v potassium; and Sulfa antibiotics  Family History  Problem Relation Age of Onset  . Breast cancer Maternal Grandmother 70    Social History Social History  Substance Use Topics  . Smoking status: Former Research scientist (life sciences)  . Smokeless tobacco: Never Used  . Alcohol use No    Review of Systems Constitutional: No fever/chills Eyes: No visual changes. ENT: No sore throat. Cardiovascular: Denies chest pain. Respiratory: Denies shortness of breath. Gastrointestinal: gradually worsening right-sided abdominal pain and bloating over the last several months Genitourinary: Negative for dysuria. Musculoskeletal: Negative for neck pain.  Negative for back pain. Integumentary: Negative for rash. Neurological: Negative for headaches, focal weakness or numbness.   ____________________________________________   PHYSICAL EXAM:  VITAL SIGNS: ED Triage Vitals  Enc Vitals Group     BP 12/07/16 1804 (!) 154/89     Pulse Rate 12/07/16 1804 84     Resp 12/07/16 1804 18     Temp 12/07/16 1804 98.1 F (36.7 C)     Temp Source 12/07/16 1804 Oral     SpO2 12/07/16 1804 97 %     Weight 12/07/16 1800 102.5 kg (226 lb)     Height 12/07/16 1800 1.651 m (5\' 5" )     Head Circumference --      Peak Flow --      Pain Score 12/07/16 1759 5     Pain Loc --      Pain Edu? --      Excl. in [place]? --     Constitutional: Alert and oriented. Well appearing and in no acute distress. Eyes: Conjunctivae are normal.  Head: Atraumatic. Nose: No congestion/rhinnorhea. Mouth/Throat: Mucous membranes are moist. Neck: No stridor.  No meningeal  signs.   Cardiovascular: Normal rate, regular rhythm. Good peripheral circulation. Grossly normal heart sounds. Respiratory: Normal respiratory effort.  No retractions. Lungs CTAB. Gastrointestinal: Soft, obese, no distention, mild diffuse tenderness on the right side of abdomen but no focal tenderness, negative Murphy sign, no tenderness at McBurney's point Musculoskeletal: No lower extremity tenderness nor edema. No gross deformities of extremities. Neurologic:  Normal speech and language. No gross focal neurologic deficits are appreciated.  Skin:  Skin is warm, dry and intact. No rash noted. Psychiatric: Mood and affect are anxious and the patient seems somewhat desperate due to the long term nature of her symptoms, but she is generally appropriate under the circumstances  ____________________________________________   LABS (all labs ordered are listed, but only abnormal results are displayed)  Labs Reviewed  COMPREHENSIVE METABOLIC PANEL - Abnormal; Notable for the following:  Result Value   Potassium 3.3 (*)    Glucose, Bld 114 (*)    All other components within normal limits  URINALYSIS, COMPLETE (UACMP) WITH MICROSCOPIC - Abnormal; Notable for the following:    Color, Urine AMBER (*)    APPearance HAZY (*)    Squamous Epithelial / LPF 0-5 (*)    All other components within normal limits  LIPASE, BLOOD  CBC   ____________________________________________  EKG  ED ECG REPORT I, Welden Hausmann, the attending physician, personally viewed and interpreted this ECG.  Date: 12/07/2016 EKG Time: 17:58 Rate: 84 Rhythm: normal sinus rhythm QRS Axis: normal Intervals: normal ST/T Wave abnormalities: Non-specific ST segment / T-wave changes, but no evidence of acute ischemia. Narrative Interpretation: no evidence of acute ischemia  ____________________________________________  RADIOLOGY   Ct Abdomen Pelvis W Contrast  Result Date: 12/07/2016 CLINICAL DATA:  Abdominal  pain, unspecified. Right upper quadrant pain. No fever. Normal white blood cell count. EXAM: CT ABDOMEN AND PELVIS WITH CONTRAST TECHNIQUE: Multidetector CT imaging of the abdomen and pelvis was performed using the standard protocol following bolus administration of intravenous contrast. CONTRAST:  167mL ISOVUE-300 IOPAMIDOL (ISOVUE-300) INJECTION 61% COMPARISON:  None. FINDINGS: Lower chest: Lung bases are clear. Hepatobiliary: The liver is enlarged spanning 23 cm with hepatic steatosis. No focal hepatic lesion. Postcholecystectomy with minimal central intrahepatic biliary ductal dilatation. Common bile duct is normal. Pancreas: No ductal dilatation or inflammation. Spleen: Normal in size without focal abnormality. Adrenals/Urinary Tract: 19 mm fat density lesion in the right adrenal gland consistent with myelolipoma. No left adrenal nodule. No hydronephrosis or perinephric edema. Homogeneous renal enhancement with symmetric excretion on delayed phase imaging. Simple cyst in the left kidney measures 3 cm. The urinary bladder is physiologically distended. No bladder wall thickening. Stomach/Bowel: Small hiatal hernia. Stomach physiologically distended. No bowel obstruction or inflammation. Small to moderate colonic stool burden. Diverticulosis of the distal descending and sigmoid colon without diverticulitis. The appendix is not visualized, no evidence of appendicitis. Vascular/Lymphatic: Aortic atherosclerosis. No aneurysm. No abdominal or pelvic adenopathy. Reproductive: There is a 4.6 cm right adnexal/ovarian cyst. Left ovary is normal in size. Uterus is unremarkable, postmenopausal appearance. Other: No free air, free fluid, or intra-abdominal fluid collection. Musculoskeletal: Degenerative and postsurgical change in the lumbar spine. There 4 non-rib-bearing lumbar vertebra. Mild inferior endplate compression fracture of L2 (labeling L5 as lower most non-rib-bearing lumbar vertebra) is chronic. IMPRESSION: 1.  Hepatomegaly and hepatic steatosis. Liver capsular stretching can be a cause of right upper quadrant pain. Postcholecystectomy without biliary dilatation. 2. **An incidental finding of potential clinical significance has been found. Right adnexal/ovarian cyst measuring 4.6 cm. Recommend nonemergent pelvic ultrasound characterization. This recommendation follows ACR consensus guidelines: White Paper of the ACR Incidental Findings Committee II on Adnexal Findings. J Am Coll Radiol [phone].** 3. Colonic diverticulosis. Incidental note of right adrenal myelolipoma. 4.  Aortic Atherosclerosis (ICD10-I70.0). Electronically Signed   By: Jeb Levering M.D.   On: 12/07/2016 23:32    ____________________________________________   PROCEDURES  Critical Care performed: No   Procedure(s) performed:   Procedures   ____________________________________________   INITIAL IMPRESSION / ASSESSMENT AND PLAN / ED COURSE  Pertinent labs & imaging results that were available during my care of the patient were reviewed by me and considered in my medical decision making (see chart for details).  the patient's blood work is unremarkable.  I had a lengthy conversation with the patient and she is begging me for help.  I feel that if I  discharge her now given that there is virtually no chance of an acute or emergent medical condition, she will come right back to the emergency department.  as a result, I agreed to order a CT scan of the abdomen and pelvis with contrast as per the outpatient plan.  I explained to her that if the CT scan does not show anything acute or emergent, she will need to follow-up with her gastroenterologist.  She understands and agrees with the plan.  Clinical Course as of Dec 09 223  Fri Dec 07, 2016  2340 Hepatic steatosis is most notable finding.  Patient has some chronic vertebral changes that are likely incidental.  Also has 4.6-cm right adnexal cyst; radiology mentions need  for non-emergent ultrasound imaging.  Given the patient's ongoing pain and anxiety, however, I will discuss it with her and anticipate getting the ultrasound tonight if she prefers. CT Abdomen Pelvis W Contrast [CF]  Sat Dec 08, 2016  0001 I discussed the results of the CT scan with the patient, and as anticipated she very much wants to proceed with the ultrasound tonight.  Again I strongly feel that she will come back to the emergency department very shortly if we do not complete her examination, and getting the ultrasound will also allow [place] to make sure there is no evidence of any ischemia in the adnexa, so I will order the study tonight.  She is a patient of Dr. Ouida Sills and can follow up on Monday as needed.  I am signing out care to Dr. Dahlia Client to follow-up the results of the ultrasound but I have told the patient that regardless of the findings, and less she has evidence of torsion, she will be discharged for outpatient follow-up on Monday.  She understands and agrees with the plan.  [CF]    Clinical Course User Index [CF] Hinda Kehr, MD    ____________________________________________  FINAL CLINICAL IMPRESSION(S) / ED DIAGNOSES  Final diagnoses:  Abdominal pain, chronic, right upper quadrant  Hepatic steatosis  Hepatomegaly  Right ovarian cyst     MEDICATIONS GIVEN DURING THIS VISIT:  Medications  iopamidol (ISOVUE-300) 61 % injection 30 mL (30 mLs Oral Contrast Given 12/07/16 2233)  iopamidol (ISOVUE-300) 61 % injection 100 mL (100 mLs Intravenous Contrast Given 12/07/16 2256)  acetaminophen (TYLENOL) tablet 1,000 mg (1,000 mg Oral Given 12/08/16 0010)     NEW OUTPATIENT MEDICATIONS STARTED DURING THIS VISIT:  New Prescriptions   No medications on file    Modified Medications   No medications on file    Discontinued Medications   No medications on file     Note:  This document was prepared using Dragon voice recognition software and may include unintentional  dictation errors.    Hinda Kehr, MD 12/08/16 [phone]

## 2016-12-07 NOTE — ED Triage Notes (Signed)
C/O RUQ pain x months.  Denies N/V

## 2016-12-08 ENCOUNTER — Emergency Department: Payer: Medicare HMO

## 2016-12-08 DIAGNOSIS — R109 Unspecified abdominal pain: Secondary | ICD-10-CM | POA: Diagnosis not present

## 2016-12-08 DIAGNOSIS — Z7984 Long term (current) use of oral hypoglycemic drugs: Secondary | ICD-10-CM | POA: Diagnosis not present

## 2016-12-08 DIAGNOSIS — K760 Fatty (change of) liver, not elsewhere classified: Secondary | ICD-10-CM | POA: Diagnosis not present

## 2016-12-08 DIAGNOSIS — R160 Hepatomegaly, not elsewhere classified: Secondary | ICD-10-CM | POA: Diagnosis not present

## 2016-12-08 DIAGNOSIS — N83201 Unspecified ovarian cyst, right side: Secondary | ICD-10-CM | POA: Diagnosis not present

## 2016-12-08 DIAGNOSIS — R1011 Right upper quadrant pain: Secondary | ICD-10-CM | POA: Diagnosis not present

## 2016-12-08 DIAGNOSIS — Z87891 Personal history of nicotine dependence: Secondary | ICD-10-CM | POA: Diagnosis not present

## 2016-12-08 DIAGNOSIS — Z79899 Other long term (current) drug therapy: Secondary | ICD-10-CM | POA: Diagnosis not present

## 2016-12-08 DIAGNOSIS — I10 Essential (primary) hypertension: Secondary | ICD-10-CM | POA: Diagnosis not present

## 2016-12-08 DIAGNOSIS — E119 Type 2 diabetes mellitus without complications: Secondary | ICD-10-CM | POA: Diagnosis not present

## 2016-12-08 DIAGNOSIS — D259 Leiomyoma of uterus, unspecified: Secondary | ICD-10-CM | POA: Diagnosis not present

## 2016-12-08 MED ORDER — ACETAMINOPHEN 500 MG PO TABS: 1000.0000 mg | ORAL_TABLET | Freq: Four times a day (QID) | ORAL | 0 refills | Status: AC | PRN

## 2016-12-08 MED ORDER — ACETAMINOPHEN 500 MG PO TABS
1000.0000 mg | ORAL_TABLET | Freq: Once | ORAL | Status: AC
Start: 2016-12-08 — End: 2016-12-08
  Administered 2016-12-08: 1000 mg via ORAL

## 2016-12-08 MED ORDER — ACETAMINOPHEN 500 MG PO TABS
ORAL_TABLET | ORAL | Status: AC
  Administered 2016-12-08: 1000 mg via ORAL
  Filled 2016-12-08: qty 2

## 2016-12-08 NOTE — ED Provider Notes (Signed)
-----------------------------------------   2:31 AM on 12/08/2016 -----------------------------------------   Blood pressure 136/84, pulse (!) 57, temperature 98.1 F (36.7 C), temperature source Oral, resp. rate 18, height 5\' 5"  (1.651 m), weight 102.5 kg (226 lb), SpO2 100 %.  Assuming care from Dr. Karma Greaser.  In short, Lisa Gutierrez is a 66 y.o. female with a chief complaint of Abdominal Pain .  Refer to the original H&P for additional details.  The current plan of care is to follow up the results of the [place].   Clinical Course as of Dec 08 229  Fri Dec 07, 2016  2340 Hepatic steatosis is most notable finding.  Patient has some chronic vertebral changes that are likely incidental.  Also has 4.6-cm right adnexal cyst; radiology mentions need for non-emergent ultrasound imaging.  Given the patient's ongoing pain and anxiety, however, I will discuss it with her and anticipate getting the ultrasound tonight if she prefers. CT Abdomen Pelvis W Contrast [CF]  Sat Dec 08, 2016  0001 I discussed the results of the CT scan with the patient, and as anticipated she very much wants to proceed with the ultrasound tonight.  Again I strongly feel that she will come back to the emergency department very shortly if we do not complete her examination, and getting the ultrasound will also allow [place] to make sure there is no evidence of any ischemia in the adnexa, so I will order the study tonight.  She is a patient of Dr. Ouida Sills and can follow up on Monday as needed.  I am signing out care to Dr. Dahlia Client to follow-up the results of the ultrasound but I have told the patient that regardless of the findings, and less she has evidence of torsion, she will be discharged for outpatient follow-up on Monday.  She understands and agrees with the plan.  [CF]  0228 1. 5.2 cm grossly simple appearing right ovarian cyst. This is almost certainly benign, but follow up ultrasound is recommended in 1 year according  to the Society of Radiologists in Ultrasound 2010 Consensus Conference Statement (D Clovis Riley et al. Management of Asymptomatic Ovarian and Other Adnexal Cysts Imaged at [place]: Society of Radiologists in [place] Statement 2010. Radiology 256 (Sept 2010): 943-954.). 2. No evidence of right ovarian torsion. 3. Nonvisualized left ovary, normal on the recent CT. 4. 5 mm myometrial calcification, most likely representing a small, calcified fibroid.   US Pelvis Complete [AW]    Clinical Course User Index [AW] Loney Hering, MD [CF] Hinda Kehr, MD   It appears that the patient has an ovarian cyst on the right that is approximately 5.2 cm. There is no evidence for torsion at this time and the patient is comfortable. I feel it appropriate to discharge the patient home and have her follow up with her OB/GYN. The patient should continue taking Tylenol for pain. She'll be discharged home.   Loney Hering, MD 12/08/16 [phone]

## 2016-12-08 NOTE — ED Notes (Signed)
Pt returned from Ultrasound.

## 2016-12-08 NOTE — Discharge Instructions (Addendum)
As we discussed, your workup was reassuring tonight.  Your CT scan showed that you have a condition called hepatic steatosis and hepatomegaly (fatty liver disease and enlarged liver).  These are chronic conditions with which your gastroenterologist can help you.  We recommend you follow up at the next available opportunity, but there is no evidence of acute or emergent medical condition at this time.  Your workup also revealed a cyst on your right ovary.  The imaging demonstrated that this is almost certainly benign (and unlikely to be the cause of your right upper quadrant pain), but we encourage you to follow up with your GYN provider to discuss the results.  You may also benefit from repeat imaging in the future to see if it has changed in size.  Return to the Emergency Department with new or worsening symptoms that concern you.

## 2016-12-13 ENCOUNTER — Ambulatory Visit: Payer: Medicare Other

## 2017-02-05 DIAGNOSIS — B373 Candidiasis of vulva and vagina: Secondary | ICD-10-CM | POA: Diagnosis not present

## 2017-02-05 DIAGNOSIS — N83201 Unspecified ovarian cyst, right side: Secondary | ICD-10-CM | POA: Diagnosis not present

## 2017-02-05 DIAGNOSIS — I10 Essential (primary) hypertension: Secondary | ICD-10-CM | POA: Diagnosis not present

## 2017-02-05 DIAGNOSIS — E119 Type 2 diabetes mellitus without complications: Secondary | ICD-10-CM | POA: Diagnosis not present

## 2017-02-05 DIAGNOSIS — Z23 Encounter for immunization: Secondary | ICD-10-CM | POA: Diagnosis not present

## 2017-02-05 DIAGNOSIS — J309 Allergic rhinitis, unspecified: Secondary | ICD-10-CM | POA: Diagnosis not present

## 2017-02-05 DIAGNOSIS — R300 Dysuria: Secondary | ICD-10-CM | POA: Diagnosis not present

## 2017-02-11 DIAGNOSIS — R69 Illness, unspecified: Secondary | ICD-10-CM | POA: Diagnosis not present

## 2017-03-11 DIAGNOSIS — J0140 Acute pansinusitis, unspecified: Secondary | ICD-10-CM | POA: Diagnosis not present

## 2017-03-11 DIAGNOSIS — M47812 Spondylosis without myelopathy or radiculopathy, cervical region: Secondary | ICD-10-CM | POA: Diagnosis not present

## 2017-04-19 DIAGNOSIS — E119 Type 2 diabetes mellitus without complications: Secondary | ICD-10-CM | POA: Diagnosis not present

## 2017-04-26 ENCOUNTER — Other Ambulatory Visit: Payer: Self-pay

## 2017-04-26 MED ORDER — TELMISARTAN-HCTZ 80-25 MG PO TABS: 1.0000 | ORAL_TABLET | Freq: Every day | ORAL | 1 refills | Status: DC

## 2017-04-30 ENCOUNTER — Other Ambulatory Visit: Payer: Self-pay

## 2017-05-01 ENCOUNTER — Other Ambulatory Visit: Payer: Self-pay

## 2017-05-01 ENCOUNTER — Other Ambulatory Visit: Payer: Self-pay | Admitting: Nurse Practitioner

## 2017-05-01 DIAGNOSIS — F411 Generalized anxiety disorder: Secondary | ICD-10-CM

## 2017-05-01 MED ORDER — PANTOPRAZOLE SODIUM 40 MG PO TBEC: 40.0000 mg | DELAYED_RELEASE_TABLET | Freq: Two times a day (BID) | ORAL | 1 refills | Status: DC

## 2017-05-01 MED ORDER — GLUCOSE BLOOD VI STRP: 1.0000 | ORAL_STRIP | Freq: Two times a day (BID) | 3 refills | Status: DC

## 2017-05-01 MED ORDER — ALPRAZOLAM 0.5 MG PO TABS: 0.5000 mg | ORAL_TABLET | Freq: Two times a day (BID) | ORAL | 1 refills | Status: DC

## 2017-05-01 NOTE — Progress Notes (Signed)
Renewed alprazolam 0.5mg  bid prn. Sent 90 day prescription to [place]

## 2017-05-02 DIAGNOSIS — R69 Illness, unspecified: Secondary | ICD-10-CM | POA: Diagnosis not present

## 2017-05-03 ENCOUNTER — Other Ambulatory Visit: Payer: Self-pay

## 2017-05-04 DIAGNOSIS — R69 Illness, unspecified: Secondary | ICD-10-CM | POA: Diagnosis not present

## 2017-06-11 ENCOUNTER — Telehealth: Payer: Self-pay

## 2017-06-11 NOTE — Telephone Encounter (Signed)
PATIENT HANDICAP PLACEMENT PAPER READY FOR PICK UP/BR

## 2017-06-19 ENCOUNTER — Other Ambulatory Visit: Payer: Self-pay

## 2017-06-19 MED ORDER — LINACLOTIDE 145 MCG PO CAPS: 145.0000 ug | ORAL_CAPSULE | ORAL | 3 refills | Status: DC | PRN

## 2017-06-20 ENCOUNTER — Other Ambulatory Visit: Payer: Self-pay

## 2017-06-20 MED ORDER — LINACLOTIDE 145 MCG PO CAPS: 145.0000 ug | ORAL_CAPSULE | ORAL | 0 refills | Status: DC | PRN

## 2017-06-24 ENCOUNTER — Other Ambulatory Visit: Payer: Self-pay

## 2017-06-24 MED ORDER — LINACLOTIDE 72 MCG PO CAPS: 72.0000 ug | ORAL_CAPSULE | Freq: Every day | ORAL | 3 refills | Status: DC

## 2017-07-01 DIAGNOSIS — R69 Illness, unspecified: Secondary | ICD-10-CM | POA: Diagnosis not present

## 2017-07-18 NOTE — Progress Notes (Signed)
Patient assistance for Janumet has been approved thru 04/15/18.[place]

## 2017-08-27 DIAGNOSIS — K760 Fatty (change of) liver, not elsewhere classified: Secondary | ICD-10-CM | POA: Diagnosis not present

## 2017-08-27 DIAGNOSIS — K5909 Other constipation: Secondary | ICD-10-CM | POA: Diagnosis not present

## 2017-08-29 DIAGNOSIS — K760 Fatty (change of) liver, not elsewhere classified: Secondary | ICD-10-CM | POA: Insufficient documentation

## 2017-10-08 ENCOUNTER — Other Ambulatory Visit: Payer: Self-pay | Admitting: Nurse Practitioner

## 2017-10-08 DIAGNOSIS — Z1231 Encounter for screening mammogram for malignant neoplasm of breast: Secondary | ICD-10-CM

## 2017-10-09 ENCOUNTER — Other Ambulatory Visit: Payer: Self-pay | Admitting: Internal Medicine

## 2017-10-10 ENCOUNTER — Other Ambulatory Visit: Payer: Self-pay | Admitting: Nurse Practitioner

## 2017-10-10 MED ORDER — TELMISARTAN-HCTZ 80-25 MG PO TABS: 1.0000 | ORAL_TABLET | Freq: Every day | ORAL | 1 refills | Status: DC

## 2017-10-10 MED ORDER — PANTOPRAZOLE SODIUM 40 MG PO TBEC: 40.0000 mg | DELAYED_RELEASE_TABLET | Freq: Two times a day (BID) | ORAL | 1 refills | Status: DC

## 2017-10-15 ENCOUNTER — Other Ambulatory Visit: Payer: Self-pay

## 2017-10-15 ENCOUNTER — Other Ambulatory Visit: Payer: Self-pay | Admitting: Nurse Practitioner

## 2017-10-15 MED ORDER — GLUCOSE BLOOD VI STRP: 1.0000 | ORAL_STRIP | 11 refills | Status: DC | PRN

## 2017-10-21 ENCOUNTER — Telehealth: Payer: Self-pay

## 2017-10-21 NOTE — Telephone Encounter (Signed)
Pt advised pres for test strips with coupons is ready for pickup

## 2017-10-22 ENCOUNTER — Other Ambulatory Visit: Payer: Self-pay

## 2017-10-22 ENCOUNTER — Telehealth: Payer: Self-pay

## 2017-10-22 NOTE — Telephone Encounter (Signed)
Spoke with pt that accu [employer] approved

## 2017-10-23 DIAGNOSIS — R69 Illness, unspecified: Secondary | ICD-10-CM | POA: Diagnosis not present

## 2017-10-29 ENCOUNTER — Ambulatory Visit: Payer: Self-pay | Admitting: Nurse Practitioner

## 2017-11-28 ENCOUNTER — Ambulatory Visit: Payer: Self-pay | Admitting: Nurse Practitioner

## 2017-12-03 ENCOUNTER — Ambulatory Visit
Admission: RE | Admit: 2017-12-03 | Discharge: 2017-12-03 | Disposition: A | Discharge status: Home / Self Care | Payer: Medicare HMO | Source: Ambulatory Visit | Attending: Nurse Practitioner | Admitting: Nurse Practitioner

## 2017-12-03 DIAGNOSIS — Z1231 Encounter for screening mammogram for malignant neoplasm of breast: Secondary | ICD-10-CM | POA: Diagnosis not present

## 2017-12-05 ENCOUNTER — Ambulatory Visit: Payer: Medicare HMO | Admitting: Nurse Practitioner

## 2017-12-06 ENCOUNTER — Ambulatory Visit (INDEPENDENT_AMBULATORY_CARE_PROVIDER_SITE_OTHER): Payer: Medicare HMO | Admitting: Nurse Practitioner

## 2017-12-06 ENCOUNTER — Encounter: Payer: Self-pay | Admitting: Nurse Practitioner

## 2017-12-06 VITALS — BP 132/100 | HR 110 | Resp 16 | Ht 66.0 in | Wt 238.4 lb

## 2017-12-06 DIAGNOSIS — I10 Essential (primary) hypertension: Secondary | ICD-10-CM | POA: Diagnosis not present

## 2017-12-06 DIAGNOSIS — R300 Dysuria: Secondary | ICD-10-CM | POA: Insufficient documentation

## 2017-12-06 DIAGNOSIS — I152 Hypertension secondary to endocrine disorders: Secondary | ICD-10-CM | POA: Insufficient documentation

## 2017-12-06 DIAGNOSIS — F411 Generalized anxiety disorder: Secondary | ICD-10-CM | POA: Diagnosis not present

## 2017-12-06 DIAGNOSIS — B372 Candidiasis of skin and nail: Secondary | ICD-10-CM | POA: Diagnosis not present

## 2017-12-06 DIAGNOSIS — E1165 Type 2 diabetes mellitus with hyperglycemia: Secondary | ICD-10-CM | POA: Diagnosis not present

## 2017-12-06 DIAGNOSIS — K219 Gastro-esophageal reflux disease without esophagitis: Secondary | ICD-10-CM | POA: Insufficient documentation

## 2017-12-06 DIAGNOSIS — R69 Illness, unspecified: Secondary | ICD-10-CM | POA: Diagnosis not present

## 2017-12-06 LAB — POCT URINALYSIS DIPSTICK
Bilirubin, UA: NEGATIVE
Blood, UA: NEGATIVE
Glucose, UA: NEGATIVE
KETONES UA: NEGATIVE
LEUKOCYTES UA: NEGATIVE
NITRITE UA: NEGATIVE
PH UA: 5 (ref 5.0–8.0)
PROTEIN UA: NEGATIVE
Spec Grav, UA: <=1.005 — AB (ref 1.010–1.025)
UROBILINOGEN UA: 0.2 U/dL

## 2017-12-06 LAB — POCT GLYCOSYLATED HEMOGLOBIN (HGB A1C): Hemoglobin A1C: 5.9 % — AB (ref 4.0–5.6)

## 2017-12-06 MED ORDER — BUPROPION HCL ER (SR) 100 MG PO TB12: 100.0000 mg | ORAL_TABLET | Freq: Every day | ORAL | 3 refills | Status: DC

## 2017-12-06 MED ORDER — PANTOPRAZOLE SODIUM 40 MG PO TBEC: 40.0000 mg | DELAYED_RELEASE_TABLET | Freq: Two times a day (BID) | ORAL | 1 refills | Status: DC

## 2017-12-06 MED ORDER — ALPRAZOLAM 0.5 MG PO TABS: 0.5000 mg | ORAL_TABLET | Freq: Two times a day (BID) | ORAL | 1 refills | Status: DC | PRN

## 2017-12-06 MED ORDER — NYSTATIN 100000 UNIT/GM EX POWD: Freq: Four times a day (QID) | CUTANEOUS | 3 refills | Status: DC

## 2017-12-06 MED ORDER — SITAGLIPTIN PHOS-METFORMIN HCL 50-500 MG PO TABS: 1.0000 | ORAL_TABLET | Freq: Two times a day (BID) | ORAL | 4 refills | Status: DC

## 2017-12-06 MED ORDER — TELMISARTAN-HCTZ 80-25 MG PO TABS: 1.0000 | ORAL_TABLET | Freq: Every day | ORAL | 4 refills | Status: DC

## 2017-12-06 MED ORDER — METOPROLOL SUCCINATE ER 25 MG PO TB24: 50.0000 mg | ORAL_TABLET | Freq: Every day | ORAL | 4 refills | Status: DC

## 2017-12-06 NOTE — Progress Notes (Signed)
[facility] [place], [place] [address]  Internal MEDICINE  Office Visit Note  Patient Name: Lisa Gutierrez  [id number]  [id number]  Date of Service: 12/06/2017  Chief Complaint  Patient presents with  . Diabetes    3 month follow up  . Labs Only    pt wants to know about liver results  . Depression  . Vaginal Itching    mainly at night,     Diabetes  She presents for her follow-up diabetic visit. She has type 2 diabetes mellitus. Her disease course has been stable. Hypoglycemia symptoms include nervousness/anxiousness. Pertinent negatives for hypoglycemia include no dizziness, headaches or tremors. Associated symptoms include fatigue. Pertinent negatives for diabetes include no chest pain, no polydipsia, no polyphagia, no polyuria and no weakness. There are no hypoglycemic complications. Symptoms are stable. There are no diabetic complications. Risk factors for coronary artery disease include diabetes mellitus, dyslipidemia, hypertension, obesity and post-menopausal. Current diabetic treatment includes oral agent (dual therapy). She is compliant with treatment all of the time. Her weight is increasing steadily. She is following a generally healthy diet. Meal planning includes avoidance of concentrated sweets. She has had a previous visit with a dietitian. She participates in exercise three times a week. There is no change in her home blood glucose trend. An ACE inhibitor/angiotensin II receptor blocker is being taken. She does not see a podiatrist.Eye exam is current.  Depression         This is a chronic problem.  The current episode started 1 to 4 weeks ago.   The onset quality is undetermined.   The problem occurs constantly.  The problem has been gradually worsening since onset.  Associated symptoms include fatigue.  Associated symptoms include no headaches and no suicidal ideas.     The symptoms are aggravated by family issues and social issues.   Treatments tried: multiple medications tried and caused negative side effects .  Compliance with treatment is variable.  Past compliance problems include medication issues.  Risk factors include stress and marital problems.       Current Medication: Outpatient Encounter Medications as of 12/06/2017  Medication Sig Note  . acetaminophen (TYLENOL) 500 MG tablet Take 2 tablets (1,000 mg total) by mouth every 6 (six) hours as needed.   . ALPRAZolam (XANAX) 0.5 MG tablet Take 1 tablet (0.5 mg total) by mouth 2 (two) times daily as needed for anxiety. As needed   . Calcium Citrate-Vitamin D (CALCIUM + D PO) Take 1 tablet by mouth daily.   Marland Kitchen docusate sodium (COLACE) 100 MG capsule Take 3 capsules by mouth daily. 02/28/2016: Received from: [place]: Take 3 capsules by mouth at bedtime  . Flaxseed, Linseed, (FLAXSEED OIL PO) Take 1,500 mg by mouth 2 (two) times daily.   . fluticasone (FLONASE) 50 MCG/ACT nasal spray Place 2 sprays into both nostrils daily as needed.    Marland Kitchen glucose blood (ACCU-CHEK SMARTVIEW) test strip 1 each by Other route as needed for other. Use as instructed   . glucose blood (ONE TOUCH ULTRA TEST) test strip 1 each by Other route 2 (two) times daily. Use as instructed   . linaclotide (LINZESS) 72 MCG capsule Take 1 capsule (72 mcg total) by mouth daily. As needed   . metoprolol succinate (TOPROL-XL) 25 MG 24 hr tablet Take 2 tablets (50 mg total) by mouth daily.   . Multiple Vitamins-Minerals (MULTIVITAMIN ADULT PO) Take 1 tablet by mouth daily.   Marland Kitchen  pantoprazole (PROTONIX) 40 MG tablet Take 1 tablet (40 mg total) by mouth 2 (two) times daily.   . sitaGLIPtin-metformin (JANUMET) 50-500 MG tablet Take 1 tablet by mouth 2 (two) times daily with a meal.   . telmisartan-hydrochlorothiazide (MICARDIS HCT) 80-25 MG tablet Take 1 tablet by mouth daily.   . [DISCONTINUED] ALPRAZolam (XANAX) 0.5 MG tablet Take 1 tablet (0.5 mg total) by mouth 2 (two) times  daily. As needed   . [DISCONTINUED] BUPROPION HCL ER, SR, PO Take 100 mg by mouth daily.    . [DISCONTINUED] metoprolol succinate (TOPROL-XL) 25 MG 24 hr tablet TAKE 2 TABLETS DAILY   . [DISCONTINUED] pantoprazole (PROTONIX) 40 MG tablet Take 1 tablet (40 mg total) by mouth 2 (two) times daily.   . [DISCONTINUED] sitaGLIPtin-metformin (JANUMET) 50-500 MG per tablet Take 1 tablet by mouth 2 (two) times daily with a meal.   . [DISCONTINUED] telmisartan-hydrochlorothiazide (MICARDIS HCT) 80-25 MG tablet Take 1 tablet by mouth daily.   . [DISCONTINUED] valsartan-hydrochlorothiazide (DIOVAN-HCT) 320-25 MG per tablet Take by mouth daily. 12/28/2014: Received from: [place]: Take by mouth.  Marland Kitchen acetaminophen (TYLENOL) 500 MG tablet Take 2 tablets by mouth as needed. 02/28/2016: Received from: [place]: 1-2 tablets by mouth single dose as needed for pain  . buPROPion (WELLBUTRIN SR) 100 MG 12 hr tablet Take 1 tablet (100 mg total) by mouth daily.   Marland Kitchen gabapentin (NEURONTIN) 300 MG capsule Take 1 capsule by mouth 3 (three) times daily as needed. 02/28/2016: Received from: [place]: Take 1 capsule (300 mg total) by mouth 3 (three) times daily.  Marland Kitchen nystatin (MYCOSTATIN/NYSTOP) powder Apply topically 4 (four) times daily.    No facility-administered encounter medications on file as of 12/06/2017.     Surgical History: Past Surgical History:  Procedure Laterality Date  . CHOLECYSTECTOMY    . EXCISION / BIOPSY BREAST / NIPPLE / DUCT Right 1973   duct removed    Medical History: Past Medical History:  Diagnosis Date  . Anxiety   . Depression   . Diabetes mellitus without complication ([place])   . GERD (gastroesophageal reflux disease)   . Hypertension   . IBS (irritable bowel syndrome)   . Neuromuscular disorder ([place])     Family History: Family History  Problem Relation Age of Onset  . Diabetes Mother    . Hypertension Mother   . Coronary artery disease Father   . Glaucoma Father   . Breast cancer Neg Hx     Social History   Socioeconomic History  . Marital status: Married    Spouse name: Not on file  . Number of children: Not on file  . Years of education: Not on file  . Highest education level: Not on file  Occupational History  . Not on file  Social Needs  . Financial resource strain: Not on file  . Food insecurity:    Worry: Not on file    Inability: Not on file  . Transportation needs:    Medical: Not on file    Non-medical: Not on file  Tobacco Use  . Smoking status: Former Research scientist (life sciences)  . Smokeless tobacco: Never Used  Substance and Sexual Activity  . Alcohol use: No    Alcohol/week: 0.0 standard drinks  . Drug use: Not on file  . Sexual activity: Not on file  Lifestyle  . Physical activity:    Days per week: Not on file  Minutes per session: Not on file  . Stress: Not on file  Relationships  . Social connections:    Talks on phone: Not on file    Gets together: Not on file    Attends religious service: Not on file    Active member of club or organization: Not on file    Attends meetings of clubs or organizations: Not on file    Relationship status: Not on file  . Intimate partner violence:    Fear of current or ex partner: Not on file    Emotionally abused: Not on file    Physically abused: Not on file    Forced sexual activity: Not on file  Other Topics Concern  . Not on file  Social History Narrative  . Not on file      Review of Systems  Constitutional: Positive for fatigue. Negative for activity change, chills and unexpected weight change.       Weight gain of 11 pounds since most recent visit .  HENT: Negative for congestion, postnasal drip, rhinorrhea, sneezing and sore throat.   Eyes: Negative.  Negative for redness.  Respiratory: Negative for cough, chest tightness and shortness of breath.   Cardiovascular: Negative for chest pain and  palpitations.  Gastrointestinal: Positive for constipation and diarrhea. Negative for abdominal pain, nausea and vomiting.  Endocrine: Negative for cold intolerance, heat intolerance, polydipsia, polyphagia and polyuria.       Blood sugars doing well   Genitourinary: Negative for dysuria and frequency.       Vaginal itching with red rash present in groin area.   Musculoskeletal: Negative for arthralgias, back pain, joint swelling and neck pain.  Skin: Positive for rash.  Allergic/Immunologic: Negative for environmental allergies.  Neurological: Negative for dizziness, tremors, weakness, numbness and headaches.  Hematological: Negative for adenopathy. Does not bruise/bleed easily.  Psychiatric/Behavioral: Positive for depression and dysphoric mood. Negative for behavioral problems (Depression), sleep disturbance and suicidal ideas. The patient is nervous/anxious.     Today's Vitals   12/06/17 1116  BP: (!) 132/100  Pulse: (!) 110  Resp: 16  SpO2: 98%  Weight: 238 lb 6.4 oz (108.1 kg)  Height: 5\' 6"  (1.676 m)    Physical Exam  Constitutional: She is oriented to person, place, and time. She appears well-developed and well-nourished. No distress.  HENT:  Head: Normocephalic and atraumatic.  Mouth/Throat: Oropharynx is clear and moist. No oropharyngeal exudate.  Eyes: Pupils are equal, round, and reactive to light. Conjunctivae and EOM are normal.  Neck: Normal range of motion. Neck supple. No JVD present. Carotid bruit is present. No tracheal deviation present. No thyromegaly present.  Cardiovascular: Normal rate, regular rhythm, normal heart sounds and intact distal pulses. Exam reveals no gallop and no friction rub.  No murmur heard. Pulmonary/Chest: Effort normal and breath sounds normal. She has no wheezes. She has no rales. She exhibits no tenderness.  Abdominal: Soft. Bowel sounds are normal. There is no tenderness.  Musculoskeletal: Normal range of motion.  Lymphadenopathy:     She has no cervical adenopathy.  Neurological: She is alert and oriented to person, place, and time. No cranial nerve deficit.  Skin: Skin is warm and dry. Rash noted. She is not diaphoretic.  Red and irritated rash in bilateral groin, mostly in folds of the skin.   Psychiatric: Her speech is normal and behavior is normal. Judgment and thought content normal. Her mood appears anxious. Cognition and memory are normal. She exhibits a depressed mood. She expresses  no suicidal ideation.  Nursing note and vitals reviewed.  Assessment/Plan: 1. Uncontrolled type 2 diabetes mellitus with hyperglycemia (HCC) - POCT HgB A1C 5.9 today. Continue diabetic medications.  - sitaGLIPtin-metformin (JANUMET) 50-500 MG tablet; Take 1 tablet by mouth 2 (two) times daily with a meal.  Dispense: 90 tablet; Refill: 4  2. Essential hypertension Stable. Continue bp medications as prescribed. Refills provided today.  - metoprolol succinate (TOPROL-XL) 25 MG 24 hr tablet; Take 2 tablets (50 mg total) by mouth daily.  Dispense: 180 tablet; Refill: 4 - telmisartan-hydrochlorothiazide (MICARDIS HCT) 80-25 MG tablet; Take 1 tablet by mouth daily.  Dispense: 90 tablet; Refill: 4  3. Cutaneous candidiasis - nystatin (MYCOSTATIN/NYSTOP) powder; Apply topically 4 (four) times daily.  Dispense: 60 g; Refill: 3  4. GAD (generalized anxiety disorder) Start wellbutrin SR 100mg  daily to help control depression/anxiety. May continue alprazolam 0.5mg  twice daily as needed for acute anxiety. - ALPRAZolam (XANAX) 0.5 MG tablet; Take 1 tablet (0.5 mg total) by mouth 2 (two) times daily as needed for anxiety. As needed  Dispense: 180 tablet; Refill: 1 - buPROPion (WELLBUTRIN SR) 100 MG 12 hr tablet; Take 1 tablet (100 mg total) by mouth daily.  Dispense: 90 tablet; Refill: 3  5. Gastroesophageal reflux disease without esophagitis - pantoprazole (PROTONIX) 40 MG tablet; Take 1 tablet (40 mg total) by mouth 2 (two) times daily.  Dispense:  180 tablet; Refill: 1  6. Dysuria - POCT Urinalysis Dipstick negative for evidence of infection.   General Counseling: katya rolston understanding of the findings of todays visit and agrees with plan of treatment. I have discussed any further diagnostic evaluation that may be needed or ordered today. We also reviewed her medications today. she has been encouraged to call the office with any questions or concerns that should arise related to todays visit.  Diabetes Counseling:  1. Addition of ACE inh/ ARB'S for nephroprotection. Microalbumin is updated  2. Diabetic foot care, prevention of complications. Podiatry consult 3. Exercise and lose weight.  4. Diabetic eye examination, Diabetic eye exam is updated  5. Monitor blood sugar closlely. nutrition counseling.  6. Sign and symptoms of hypoglycemia including shaking sweating,confusion and headaches.   This patient was seen by [name] FNP Collaboration with Dr Lavera Guise as a part of collaborative care agreement  Orders Placed This Encounter  Procedures  . POCT HgB A1C  . POCT Urinalysis Dipstick    Meds ordered this encounter  Medications  . ALPRAZolam (XANAX) 0.5 MG tablet    Sig: Take 1 tablet (0.5 mg total) by mouth 2 (two) times daily as needed for anxiety. As needed    Dispense:  180 tablet    Refill:  1    Order Specific Question:   Supervising Provider    Answer:   Lavera Guise [[id number]]  . buPROPion (WELLBUTRIN SR) 100 MG 12 hr tablet    Sig: Take 1 tablet (100 mg total) by mouth daily.    Dispense:  90 tablet    Refill:  3    Order Specific Question:   Supervising Provider    Answer:   Lavera Guise [[id number]]  . sitaGLIPtin-metformin (JANUMET) 50-500 MG tablet    Sig: Take 1 tablet by mouth 2 (two) times daily with a meal.    Dispense:  90 tablet    Refill:  4    Order Specific Question:   Supervising Provider    Answer:   Lavera Guise [place]  . pantoprazole (  PROTONIX) 40 MG tablet    Sig: Take 1 tablet  (40 mg total) by mouth 2 (two) times daily.    Dispense:  180 tablet    Refill:  1    Order Specific Question:   Supervising Provider    Answer:   Lavera Guise [[id number]]  . metoprolol succinate (TOPROL-XL) 25 MG 24 hr tablet    Sig: Take 2 tablets (50 mg total) by mouth daily.    Dispense:  180 tablet    Refill:  4    Order Specific Question:   Supervising Provider    Answer:   Lavera Guise [[id number]]  . telmisartan-hydrochlorothiazide (MICARDIS HCT) 80-25 MG tablet    Sig: Take 1 tablet by mouth daily.    Dispense:  90 tablet    Refill:  4    Order Specific Question:   Supervising Provider    Answer:   Lavera Guise [[id number]]  . nystatin (MYCOSTATIN/NYSTOP) powder    Sig: Apply topically 4 (four) times daily.    Dispense:  60 g    Refill:  3    Order Specific Question:   Supervising Provider    Answer:   Lavera Guise [[id number]]    Time spent: 25 Minutes      Dr Lavera Guise Internal medicine

## 2017-12-10 ENCOUNTER — Telehealth: Payer: Self-pay

## 2017-12-10 ENCOUNTER — Other Ambulatory Visit: Payer: Self-pay

## 2017-12-10 DIAGNOSIS — F411 Generalized anxiety disorder: Secondary | ICD-10-CM

## 2017-12-10 MED ORDER — BUPROPION HCL ER (SR) 100 MG PO TB12: 100.0000 mg | ORAL_TABLET | Freq: Every day | ORAL | 3 refills | Status: DC

## 2017-12-10 NOTE — Telephone Encounter (Signed)
Pt called stating that her [facility] Rx was not at [facility], it was sent to [facility] order but [facility], [address] also pt wanted a sample blood sugar machine. Her machine has messed up.

## 2017-12-11 ENCOUNTER — Ambulatory Visit: Payer: Medicare HMO | Admitting: Dietician

## 2017-12-17 DIAGNOSIS — R300 Dysuria: Secondary | ICD-10-CM | POA: Diagnosis not present

## 2017-12-17 DIAGNOSIS — N952 Postmenopausal atrophic vaginitis: Secondary | ICD-10-CM | POA: Diagnosis not present

## 2017-12-17 DIAGNOSIS — Z01419 Encounter for gynecological examination (general) (routine) without abnormal findings: Secondary | ICD-10-CM | POA: Diagnosis not present

## 2017-12-17 DIAGNOSIS — N83201 Unspecified ovarian cyst, right side: Secondary | ICD-10-CM | POA: Diagnosis not present

## 2017-12-17 DIAGNOSIS — Z124 Encounter for screening for malignant neoplasm of cervix: Secondary | ICD-10-CM | POA: Diagnosis not present

## 2017-12-26 ENCOUNTER — Ambulatory Visit: Payer: Medicare HMO | Admitting: Dietician

## 2017-12-27 ENCOUNTER — Ambulatory Visit: Payer: Medicare HMO | Admitting: Dietician

## 2018-01-02 DIAGNOSIS — N83201 Unspecified ovarian cyst, right side: Secondary | ICD-10-CM | POA: Diagnosis not present

## 2018-01-03 ENCOUNTER — Encounter: Payer: Self-pay | Admitting: Nurse Practitioner

## 2018-01-03 ENCOUNTER — Encounter: Payer: Medicare HMO | Attending: Nurse Practitioner | Admitting: Dietician

## 2018-01-03 ENCOUNTER — Encounter: Payer: Self-pay | Admitting: Dietician

## 2018-01-03 VITALS — Ht 66.0 in | Wt 241.7 lb

## 2018-01-03 DIAGNOSIS — Z6839 Body mass index (BMI) 39.0-39.9, adult: Secondary | ICD-10-CM | POA: Insufficient documentation

## 2018-01-03 DIAGNOSIS — E119 Type 2 diabetes mellitus without complications: Secondary | ICD-10-CM

## 2018-01-03 DIAGNOSIS — E669 Obesity, unspecified: Secondary | ICD-10-CM | POA: Insufficient documentation

## 2018-01-03 DIAGNOSIS — Z713 Dietary counseling and surveillance: Secondary | ICD-10-CM | POA: Insufficient documentation

## 2018-01-03 DIAGNOSIS — K760 Fatty (change of) liver, not elsewhere classified: Secondary | ICD-10-CM

## 2018-01-03 DIAGNOSIS — Z6838 Body mass index (BMI) 38.0-38.9, adult: Secondary | ICD-10-CM

## 2018-01-03 NOTE — Patient Instructions (Signed)
   Keep a food record to monitor intake and keep meals balanced.   Work to relax when eating and chew foods thoroughly, try 20 times each bite to help with digestion.  Eat something light/ small meal or balanced snack every 3-5 hours during the day.   Continue light exercise as able.   OK to have a Glucerna or Premier Protein drink if you don't feel well enough to eat.

## 2018-01-03 NOTE — Progress Notes (Signed)
Medical Nutrition Therapy: Visit start time: 1330  end time: 1530  Assessment:  Diagnosis: non-alcoholic fatty liver; Type 2 diabetes, obesity Past medical history: HTN, IBS, cholecystectomy, ovarian cyst Psychosocial issues/ stress concerns: depression, anxiety  Preferred learning method:  Nicki [place] . Hands-on   Current weight: 241.7lbs  Height: 5'6" Medications, supplements: reconciled list in medical record  Progress and evaluation: Patient reports high stress level, does not feel she is dealing well with stress- does affect eating behavior. Patient is intolerant to some foods including most raw vegetables and fruits which will cause abdominal pain and digestive upset. She does often eat quickly, has 4 dogs that beg for food from her. She wants to lose weight but is having difficulty due to stress and lack of family support. Diabetes seems to be well controlled, patient's BG record shows fasting BGs ranging 100-120mg /dl.    Physical activity: water aerobics 60 mintues, 1-2x weekly, currently just water walking due to shoulder pain in both shoulders  Dietary Intake:  Usual eating pattern includes 2, sometimes 3 meals and 0-1 snacks per day. Dining out frequency: 4-5 meals per week.  Breakfast: eggs and toast; light yogurt with cheerios and fruit; cereal; oatmeal; occasional biscuit; 1/2 bagel with butter Snack: none Lunch: none or peanut butter crackers, cashews; 1/2-1 tomato sandwich in summer; 1/2-1 ham sandwich; salad with tomato, no lettuce Snack: usually none or nuts, crackers Supper: green beans other veg with stewed beef or chicken, pinto beans, cornbread; often out-- [employer] or [place], 1/4 steak and cheese sandwich with tomato, maybe cooked onion and/or peppers. Patient prefers to eat at home but husband prefers to eat out. Snack: usually pkg peanut butter crackers; avoids popcorn other than very small portions Beverages: mostly water  Nutrition Care  Education: Topics covered: weight control, fatty liver, diabetes Basic nutrition: appropriate nutrient balance, appropriate meal and snack schedule    Weight control: benefits of weight control, choosing low fat and low sugar foods for calorie control and liver health; appropriate food portions; plate method for balanced meals, and healthy meal and snack options; benefits of tracking food intake. Advanced nutrition: dining out Diabetes: appropriate meal and snack schedule, appropriate carb intake and balance Fatty Liver: importance of low fat choices, importance of low sugar intake and limiting processed starches; weight loss; role of physical activity Other: importance of and options for stress/depression/anxiety management for diabetes, liver health, and weight loss; discussed options for improving emotional health. Advised relaxing when eating and chewing food thoroughly, taking small bites to help digestive process.   Nutritional Diagnosis:  [place]-1.4 Altered GI function As related to fatty liver disease and IBS.  As evidenced by medical diagnosis of hepatic steatosis, and patient report of health history. [place]-3.3 Overweight/obesity As related to excess calories, limited activity, stress.  As evidenced by patient with BMI of 38.8, and patient report of dietary and lifestyle history.  Intervention: Instruction and discussion as noted above.   Patient is making many healthy food choices, including when dining out.    She will plan to reduce size of meals and eat at regular intervals.    She will work on chewing foods more when eating to ease GI pain after eating.    She will also work on improving emotional health.    Set goals with direction from patient.   Education Materials given:  . Plate Planner with food lists . Sample menus . Snacking handout . Goals/ instructions   Learner/ who was taught:  . Patient  Level of understanding: Marland Kitchen Verbalizes/ demonstrates  competency   Demonstrated degree of understanding via:   Teach back Learning barriers: . None  Willingness to learn/ readiness for change: . Eager, change in progress  Monitoring and Evaluation:  Dietary intake, exercise, liver health, diabetres, and body weight      follow up: 01/28/18

## 2018-01-08 DIAGNOSIS — D2272 Melanocytic nevi of left lower limb, including hip: Secondary | ICD-10-CM | POA: Diagnosis not present

## 2018-01-08 DIAGNOSIS — L821 Other seborrheic keratosis: Secondary | ICD-10-CM | POA: Diagnosis not present

## 2018-01-08 DIAGNOSIS — D2261 Melanocytic nevi of right upper limb, including shoulder: Secondary | ICD-10-CM | POA: Diagnosis not present

## 2018-01-08 DIAGNOSIS — D225 Melanocytic nevi of trunk: Secondary | ICD-10-CM | POA: Diagnosis not present

## 2018-01-08 DIAGNOSIS — L738 Other specified follicular disorders: Secondary | ICD-10-CM | POA: Diagnosis not present

## 2018-01-08 DIAGNOSIS — L918 Other hypertrophic disorders of the skin: Secondary | ICD-10-CM | POA: Diagnosis not present

## 2018-01-08 DIAGNOSIS — X32XXXA Exposure to sunlight, initial encounter: Secondary | ICD-10-CM | POA: Diagnosis not present

## 2018-01-08 DIAGNOSIS — D2262 Melanocytic nevi of left upper limb, including shoulder: Secondary | ICD-10-CM | POA: Diagnosis not present

## 2018-01-08 DIAGNOSIS — D2271 Melanocytic nevi of right lower limb, including hip: Secondary | ICD-10-CM | POA: Diagnosis not present

## 2018-01-08 DIAGNOSIS — L814 Other melanin hyperpigmentation: Secondary | ICD-10-CM | POA: Diagnosis not present

## 2018-01-13 ENCOUNTER — Telehealth: Payer: Self-pay

## 2018-01-13 NOTE — Telephone Encounter (Signed)
Called in refill for patient assistance ([employer]) for Lisa Gutierrez

## 2018-01-17 DIAGNOSIS — R69 Illness, unspecified: Secondary | ICD-10-CM | POA: Diagnosis not present

## 2018-01-24 ENCOUNTER — Encounter: Payer: Self-pay | Admitting: Nurse Practitioner

## 2018-01-24 NOTE — Progress Notes (Signed)
SCANNED IN DIABETES BLOOD SUGAR REPORT.

## 2018-01-25 DIAGNOSIS — R69 Illness, unspecified: Secondary | ICD-10-CM | POA: Diagnosis not present

## 2018-01-28 ENCOUNTER — Ambulatory Visit: Payer: Medicare HMO | Admitting: Dietician

## 2018-02-06 ENCOUNTER — Ambulatory Visit (INDEPENDENT_AMBULATORY_CARE_PROVIDER_SITE_OTHER): Payer: Medicare HMO | Admitting: Nurse Practitioner

## 2018-02-06 ENCOUNTER — Encounter: Payer: Self-pay | Admitting: Nurse Practitioner

## 2018-02-06 VITALS — BP 142/90 | HR 97 | Resp 16 | Ht 65.0 in | Wt 237.4 lb

## 2018-02-06 DIAGNOSIS — I10 Essential (primary) hypertension: Secondary | ICD-10-CM

## 2018-02-06 DIAGNOSIS — R69 Illness, unspecified: Secondary | ICD-10-CM | POA: Diagnosis not present

## 2018-02-06 DIAGNOSIS — Z0001 Encounter for general adult medical examination with abnormal findings: Secondary | ICD-10-CM | POA: Diagnosis not present

## 2018-02-06 DIAGNOSIS — F5101 Primary insomnia: Secondary | ICD-10-CM | POA: Diagnosis not present

## 2018-02-06 DIAGNOSIS — F411 Generalized anxiety disorder: Secondary | ICD-10-CM

## 2018-02-06 DIAGNOSIS — E119 Type 2 diabetes mellitus without complications: Secondary | ICD-10-CM | POA: Diagnosis not present

## 2018-02-06 DIAGNOSIS — E1165 Type 2 diabetes mellitus with hyperglycemia: Secondary | ICD-10-CM | POA: Insufficient documentation

## 2018-02-06 MED ORDER — SUVOREXANT 10 MG PO TABS: 10.0000 mg | ORAL_TABLET | Freq: Every evening | ORAL | 3 refills | Status: DC | PRN

## 2018-02-06 NOTE — Addendum Note (Signed)
Addended by: Corlis Hove on: 02/06/2018 02:28 PM   Modules accepted: Orders

## 2018-02-06 NOTE — Progress Notes (Signed)
[facility] [place], [place] [address]  Internal MEDICINE  Office Visit Note  Patient Name: Lisa Gutierrez  [id number]  [id number]  Date of Service: 02/06/2018   Pt is here for routine health maintenance examination  Chief Complaint  Patient presents with  . Medicare Wellness    well visit  . Letter for School/Work    [employer] form      The patient is here for annual health maintenance exam. She continues to have trouble with sleep. Has tried melatonin and this has not helped at all. Prescribed alprazolam helps her sleep for a short period of time, but she wakes up after just a few hours and cannot get back to sleep. She has not tried anything else to help her sleep better. She plans to see [facility] today to get started with more regular treatment for anxiety and depression.  She also has chronic constipation/diarrhea associated with IBS. Does take linzess 171mcg daily. Does help her to move her bowels every day, feels like she does not empty her bowels completely. She has had colonoscopy which showed hemorrhoids, but no other abnormalities. She does have follow up with her GI doctor in early November.  Blood sugars and blood pressure are well controlled. Last HgbA1c, done 11/2017, was 5.9. She is doing water aerobics three times weekly. Makes her feel good. Gets her out of the house and helps improve her mental well being.    Current Medication: Outpatient Encounter Medications as of 02/06/2018  Medication Sig Note  . acetaminophen (TYLENOL) 500 MG tablet Take 2 tablets by mouth as needed. 02/28/2016: Received from: [place]: 1-2 tablets by mouth single dose as needed for pain  . ALPRAZolam (XANAX) 0.5 MG tablet Take 1 tablet (0.5 mg total) by mouth 2 (two) times daily as needed for anxiety. As needed   . Calcium Citrate-Vitamin D (CALCIUM + D PO) Take 1 tablet by mouth daily.   . Flaxseed, Linseed, (FLAXSEED OIL PO) Take  1,500 mg by mouth 2 (two) times daily.   Marland Kitchen glucose blood (ACCU-CHEK SMARTVIEW) test strip 1 each by Other route as needed for other. Use as instructed   . linaclotide (LINZESS) 72 MCG capsule Take 1 capsule (72 mcg total) by mouth daily. As needed   . metoprolol succinate (TOPROL-XL) 25 MG 24 hr tablet Take 2 tablets (50 mg total) by mouth daily.   . Multiple Vitamins-Minerals (MULTIVITAMIN ADULT PO) Take 1 tablet by mouth daily.   Marland Kitchen nystatin (MYCOSTATIN/NYSTOP) powder Apply topically 4 (four) times daily.   . pantoprazole (PROTONIX) 40 MG tablet Take 1 tablet (40 mg total) by mouth 2 (two) times daily.   . polyethylene glycol powder (GLYCOLAX/MIRALAX) powder Take by mouth.   . sitaGLIPtin-metformin (JANUMET) 50-500 MG tablet Take 1 tablet by mouth 2 (two) times daily with a meal.   . telmisartan-hydrochlorothiazide (MICARDIS HCT) 80-25 MG tablet Take 1 tablet by mouth daily.   Marland Kitchen UNABLE TO FIND Estriol 1 mg/g Use 1/4 app per vagina 1-2 times weekly Disp 30 g tube with 2 rf Called to [facility] 01/03/2018: compound medication for vaginal use  . buPROPion (WELLBUTRIN SR) 100 MG 12 hr tablet Take 1 tablet (100 mg total) by mouth daily. (Patient not taking: Reported on 01/03/2018)   . fluticasone (FLONASE) 50 MCG/ACT nasal spray Place 2 sprays into both nostrils daily as needed.    . Suvorexant (BELSOMRA) 10 MG TABS Take 10 mg by mouth at bedtime as  needed.    No facility-administered encounter medications on file as of 02/06/2018.     Surgical History: Past Surgical History:  Procedure Laterality Date  . CHOLECYSTECTOMY    . EXCISION / BIOPSY BREAST / NIPPLE / DUCT Right 1973   duct removed    Medical History: Past Medical History:  Diagnosis Date  . Anxiety   . Depression   . Diabetes mellitus without complication ([place])   . GERD (gastroesophageal reflux disease)   . Hypertension   . IBS (irritable bowel syndrome)   . Neuromuscular disorder ([place])     Family History: Family History   Problem Relation Age of Onset  . Diabetes Mother   . Hypertension Mother   . Coronary artery disease Father   . Glaucoma Father   . Breast cancer Neg Hx       Review of Systems  Constitutional: Negative for activity change, chills, fatigue and unexpected weight change.  HENT: Negative for congestion, postnasal drip, rhinorrhea, sneezing and sore throat.   Eyes: Negative.  Negative for redness.  Respiratory: Negative for cough, chest tightness and shortness of breath.   Cardiovascular: Negative for chest pain and palpitations.  Gastrointestinal: Positive for constipation. Negative for abdominal pain, diarrhea and nausea.  Endocrine: Negative for cold intolerance, heat intolerance, polydipsia, polyphagia and polyuria.       Blood sugars doing well   Genitourinary: Positive for frequency. Negative for dysuria.  Musculoskeletal: Positive for back pain. Negative for arthralgias, joint swelling and neck pain.  Skin: Negative for rash.  Allergic/Immunologic: Negative for environmental allergies.  Neurological: Negative for dizziness, tremors, numbness and headaches.  Hematological: Negative for adenopathy. Does not bruise/bleed easily.  Psychiatric/Behavioral: Positive for sleep disturbance. Negative for behavioral problems (Depression) and suicidal ideas. The patient is nervous/anxious.     Today's Vitals   02/06/18 0954  BP: (!) 142/90  Pulse: 97  Resp: 16  SpO2: 92%  Weight: 237 lb 6.4 oz (107.7 kg)  Height: 5\' 5"  (1.651 m)    Physical Exam  Constitutional: She is oriented to person, place, and time. She appears well-developed and well-nourished. No distress.  HENT:  Head: Normocephalic and atraumatic.  Nose: Nose normal.  Mouth/Throat: Oropharynx is clear and moist. No oropharyngeal exudate.  Eyes: Pupils are equal, round, and reactive to light. Conjunctivae and EOM are normal.  Neck: Normal range of motion. Neck supple. No JVD present. No tracheal deviation present. No  thyromegaly present.  Cardiovascular: Normal rate, regular rhythm, normal heart sounds and intact distal pulses. Exam reveals no gallop and no friction rub.  No murmur heard. Pulmonary/Chest: Effort normal and breath sounds normal. No respiratory distress. She has no wheezes. She has no rales. She exhibits no tenderness.  Abdominal: Soft. Bowel sounds are normal. There is tenderness.  Genitourinary:  Genitourinary Comments: GYN and breast exam performed per GYN provider.   Musculoskeletal: Normal range of motion.  Lymphadenopathy:    She has no cervical adenopathy.  Neurological: She is alert and oriented to person, place, and time. No cranial nerve deficit.  Skin: Skin is warm and dry. She is not diaphoretic.  Psychiatric: Her speech is normal and behavior is normal. Judgment and thought content normal. Her mood appears anxious. Cognition and memory are normal.  Nursing note and vitals reviewed.    Depression screen [facility] 2/9 01/03/2018 12/06/2017 02/28/2016 12/28/2014  Decreased Interest 3 3 2  0  Down, Depressed, Hopeless 3 3 3 1   PHQ - 2 Score 6 6 5 1   Altered  sleeping 3 3 2  0  Tired, decreased energy 3 3 2  0  Change in appetite 3 3 2  0  Feeling bad or failure about yourself  3 - 3 1  Trouble concentrating 3 0 3 0  Moving slowly or fidgety/restless 3 0 2 0  Suicidal thoughts 1 0 0 0  PHQ-9 Score 25 15 19 2   Difficult doing work/chores Very difficult - Very difficult Not difficult at all    Functional Status Survey: Is the patient deaf or have difficulty hearing?: Yes Does the patient have difficulty seeing, even when wearing glasses/contacts?: No Does the patient have difficulty concentrating, remembering, or making decisions?: No Does the patient have difficulty walking or climbing stairs?: No Does the patient have difficulty dressing or bathing?: No Does the patient have difficulty doing errands alone such as visiting a doctor's office or shopping?: No  MMSE - [place]  Exam 02/06/2018  Orientation to time 5  Orientation to Place 5  Registration 3  Attention/ Calculation 5  Recall 3  Language- name 2 objects 2  Language- repeat 1  Language- follow 3 step command 3  Language- read & follow direction 1  Write a sentence 1  Copy design 1  Total score 30    Fall Risk  02/06/2018 02/06/2018 01/03/2018 12/06/2017 04/03/2016  Falls in the past year? No No Yes Yes No  Number falls in past yr: - - 1 1 -  Injury with Fall? - - Yes No -  Comment - - nosebleed, bruising - -  Risk for fall due to : - - History of fall(s) - -     LABS: Recent Results (from the past 2160 hour(s))  POCT HgB A1C     Status: Abnormal   Collection Time: 12/06/17 11:37 AM  Result Value Ref Range   Hemoglobin A1C 5.9 (A) 4.0 - 5.6 %   HbA1c POC (<> result, manual entry)     HbA1c, POC (prediabetic range)     HbA1c, POC (controlled diabetic range)    POCT Urinalysis Dipstick     Status: Abnormal   Collection Time: 12/06/17 11:38 AM  Result Value Ref Range   Color, UA     Clarity, UA     Glucose, UA Negative Negative   Bilirubin, UA negative    Ketones, UA negative    Spec Grav, UA <=1.005 (A) 1.010 - 1.025   Blood, UA negative    pH, UA 5.0 5.0 - 8.0   Protein, UA Negative Negative   Urobilinogen, UA 0.2 0.2 or 1.0 E.U./dL   Nitrite, UA negative    Leukocytes, UA Negative Negative   Appearance     Odor     Assessment/Plan:  1. Encounter for general adult medical examination with abnormal findings Annual health maintenance exam today  2. Type 2 diabetes mellitus without complication, without long-term current use of insulin ([place]) Most recent HgbA1c on 12/06/2017 was 5.9. Continue diabetic medication as prescribed. Continue with ADA diet regimen and increased exercise.   3. Essential hypertension Stable. Continue bp medication as prescribed.   4. GAD (generalized anxiety disorder) May take alprazolam as needed and as prescribed. Patient to see providers at [facility]  for further evaluation and treatment.   5. Primary insomnia Trial Belsomra 10mg  at bedtime as needed. Samples were provided today. Recommended she discuss difficulty sleeping with providers ar [facility].  - Suvorexant (BELSOMRA) 10 MG TABS; Take 10 mg by mouth at bedtime as needed.  Dispense: 30 tablet;  Refill: 3   General Counseling: Celestina verbalizes understanding of the findings of todays visit and agrees with plan of treatment. I have discussed any further diagnostic evaluation that may be needed or ordered today. We also reviewed her medications today. she has been encouraged to call the office with any questions or concerns that should arise related to todays visit.    Counseling:  Diabetes Counseling:  1. Addition of ACE inh/ ARB'S for nephroprotection. Microalbumin is updated  2. Diabetic foot care, prevention of complications. Podiatry consult 3. Exercise and lose weight.  4. Diabetic eye examination, Diabetic eye exam is updated  5. Monitor blood sugar closlely. nutrition counseling.  6. Sign and symptoms of hypoglycemia including shaking sweating,confusion and headaches.   This patient was seen by [place] with Dr Lavera Guise as a part of collaborative care agreement  Meds ordered this encounter  Medications  . Suvorexant (BELSOMRA) 10 MG TABS    Sig: Take 10 mg by mouth at bedtime as needed.    Dispense:  30 tablet    Refill:  3    Sample provided 02/06/2018    Order Specific Question:   Supervising Provider    Answer:   Lavera Guise [[id number]]    Time spent: [place], MD  Internal Medicine

## 2018-02-07 LAB — MICROSCOPIC EXAMINATION
BACTERIA UA: NONE SEEN
CASTS: NONE SEEN /LPF

## 2018-02-07 LAB — UA/M W/RFLX CULTURE, ROUTINE
Bilirubin, UA: NEGATIVE
GLUCOSE, UA: NEGATIVE
Ketones, UA: NEGATIVE
Leukocytes, UA: NEGATIVE
Nitrite, UA: NEGATIVE
PH UA: 6.5 (ref 5.0–7.5)
Protein, UA: NEGATIVE
RBC, UA: NEGATIVE
Specific Gravity, UA: 1.01 (ref 1.005–1.030)
Urobilinogen, Ur: 0.2 mg/dL (ref 0.2–1.0)

## 2018-02-11 ENCOUNTER — Ambulatory Visit: Payer: Medicare HMO | Admitting: Dietician

## 2018-02-18 ENCOUNTER — Encounter: Payer: Self-pay | Admitting: Nurse Practitioner

## 2018-03-05 ENCOUNTER — Encounter: Payer: Medicare HMO | Attending: Nurse Practitioner | Admitting: Dietician

## 2018-03-05 DIAGNOSIS — E669 Obesity, unspecified: Secondary | ICD-10-CM | POA: Diagnosis not present

## 2018-03-05 DIAGNOSIS — Z713 Dietary counseling and surveillance: Secondary | ICD-10-CM | POA: Insufficient documentation

## 2018-03-05 DIAGNOSIS — Z6839 Body mass index (BMI) 39.0-39.9, adult: Secondary | ICD-10-CM | POA: Insufficient documentation

## 2018-03-05 DIAGNOSIS — E119 Type 2 diabetes mellitus without complications: Secondary | ICD-10-CM | POA: Diagnosis not present

## 2018-03-05 DIAGNOSIS — K760 Fatty (change of) liver, not elsewhere classified: Secondary | ICD-10-CM

## 2018-03-05 DIAGNOSIS — Z6838 Body mass index (BMI) 38.0-38.9, adult: Secondary | ICD-10-CM

## 2018-03-05 NOTE — Patient Instructions (Addendum)
   Eat a small handful of nuts or 5-6 crackers with 1-2 Tbsp peanut butter for a nighttime snack.   Continue to choose healthy options at restaurants, try checking calorie levels and aim for 400-600 for meal.   Can also try some probiotic and prebiotic foods like Kefir, "real" sauerkraut, Tempeh (soy based protein food).  For a protein drink, try Glucerna, or Ensure Protein Max (not regular ensure) or Carnation Breakfast Essentials low sugar version.

## 2018-03-05 NOTE — Progress Notes (Signed)
Medical Nutrition Therapy: Visit start time: 4585  end time: 1145  Assessment:  Diagnosis: non-alcoholic fatty liver; Type 2 diabetes, obesity Medical history changes: no changes Psychosocial issues/ stress concerns: depression, anxiety  Current weight: not measured today per patient request  Medications, supplement changes: no changes  Progress and evaluation: patient grieving loss of her dog suddenly 2 days ago.  She continues to deal with high stress level, family-related and health-related. She reports feeling as if she has no one she can talk to openly about personal issues. She has been referred to a mental health professional for counseling and any necessary medication, but the recommended practice is cost prohibitive for her. She is taking Aprazolam to improve sleep, but feels she wakes up in a depressed state due to the medication. She has been prescribed Wellbutrin but is concerned over potential side effects.  Patient reports BGs are in 120s or below in fasting state.  Patient reports frequent restaurant meals for supper per husband's preference; she is unsure if her efforts to control calories are appropriate. She would like to avoid stress eating, but acknowledges that will be very difficult considering her ongoing high level of stress.   Physical activity: water aerobics 60 minutes 1-2x a week, + dog walking daily  Dietary Intake:  Not assessed today due to priority of patient's emotional issues.  Nutrition Care Education: Topics covered: stress, dining out, fatty liver, IBS Advanced nutrition:  dining out-- choosing low-cal options at restaurants, goal for mealtime kcal intake Fatty liver: reducing processed starches and sugars as quickest way to remove fat deposits from liver; role of weight loss IBS: probiotic and prebiotic foods and role of gut microflora Diabetes: discussed factors influencing BGs during the night; options for bedtime snack. Stress management: importance of  dealing with root cause of stress and finding ways to relax, other than food; strategies for obtaining beneficial professional help.   Nutritional Diagnosis:  [place]-1.4 Altered GI function As related to fatty liver disease and IBS with constipation.  As evidenced by medical diagnosis of hepatic steatosis, and patient report of health history and symptoms. [place]-3.3 Overweight/obesity As related to excess calories, stress.  As evidenced by patient with BMI of 38, and patient report of dietary and lifestyle history.  Intervention: Instruction and discussion as noted above.   Significant diet changes are difficult for patient at this time due to high stress level.    Patient is making an ongoing effort to make healthy food choices and control food portions.    Patient would like to meet with counselor, she will call [facility] pastoral care counselor to schedule an appointment.      Education Materials given:  . 56 [employer] Stress [employer]ucers . Goals/ instructions   Learner/ who was taught:  . Patient    Level of understanding: Marland Kitchen Verbalizes/ demonstrates competency   Demonstrated degree of understanding via:   Teach back Learning barriers: . None  Willingness to learn/ readiness for change: . Eager, change in progress   Monitoring and Evaluation:  Dietary intake, exercise, BG control, and body weight      follow up: 04/04/18

## 2018-03-06 DIAGNOSIS — R69 Illness, unspecified: Secondary | ICD-10-CM | POA: Diagnosis not present

## 2018-03-25 ENCOUNTER — Ambulatory Visit (INDEPENDENT_AMBULATORY_CARE_PROVIDER_SITE_OTHER): Payer: Medicare HMO | Admitting: Nurse Practitioner

## 2018-03-25 ENCOUNTER — Encounter: Payer: Self-pay | Admitting: Nurse Practitioner

## 2018-03-25 VITALS — BP 161/94 | HR 81 | Temp 96.2°F | Ht 66.0 in | Wt 241.0 lb

## 2018-03-25 DIAGNOSIS — R6889 Other general symptoms and signs: Secondary | ICD-10-CM

## 2018-03-25 DIAGNOSIS — I10 Essential (primary) hypertension: Secondary | ICD-10-CM | POA: Diagnosis not present

## 2018-03-25 DIAGNOSIS — J069 Acute upper respiratory infection, unspecified: Secondary | ICD-10-CM

## 2018-03-25 DIAGNOSIS — E119 Type 2 diabetes mellitus without complications: Secondary | ICD-10-CM

## 2018-03-25 DIAGNOSIS — R05 Cough: Secondary | ICD-10-CM | POA: Diagnosis not present

## 2018-03-25 DIAGNOSIS — R059 Cough, unspecified: Secondary | ICD-10-CM | POA: Insufficient documentation

## 2018-03-25 LAB — POCT INFLUENZA A/B: Influenza A, POC: NEGATIVE

## 2018-03-25 MED ORDER — AZITHROMYCIN 250 MG PO TABS
ORAL_TABLET | ORAL | 0 refills | Status: DC
Start: 2018-03-25 — End: 2018-06-19

## 2018-03-25 MED ORDER — HYDROCOD POLST-CPM POLST ER 10-8 MG/5ML PO SUER: 5.0000 mL | Freq: Two times a day (BID) | ORAL | 0 refills | Status: DC | PRN

## 2018-03-25 NOTE — Progress Notes (Signed)
[facility], [place], [place] [address]  Internal MEDICINE  Office Visit Note  Patient Name: Lisa Gutierrez  [id number]  [id number]  Date of Service: 03/25/2018  Chief Complaint  Patient presents with  . Cough  . Sinusitis     The patient is here for sick visit. Today, she has sore throat, headache, fever, and congestion. Symptoms started about 2 weeks ago. At that time, symptoms weren't bad. Actually though she was getting better. Then, Sunday night, she started feeling much worse. Throat is very sore. She has frontal headache. She has dry, harsh cough. She is wheezing. Symptoms are much worse at night .  Pt is here for a sick visit.     Current Medication:  Outpatient Encounter Medications as of 03/25/2018  Medication Sig Note  . acetaminophen (TYLENOL) 500 MG tablet Take 2 tablets by mouth as needed. 02/28/2016: Received from: [place]: 1-2 tablets by mouth single dose as needed for pain  . ALPRAZolam (XANAX) 0.5 MG tablet Take 1 tablet (0.5 mg total) by mouth 2 (two) times daily as needed for anxiety. As needed   . buPROPion (WELLBUTRIN SR) 100 MG 12 hr tablet Take 1 tablet (100 mg total) by mouth daily.   . Calcium Citrate-Vitamin D (CALCIUM + D PO) Take 1 tablet by mouth daily.   . Flaxseed, Linseed, (FLAXSEED OIL PO) Take 1,500 mg by mouth 2 (two) times daily.   . fluticasone (FLONASE) 50 MCG/ACT nasal spray Place 2 sprays into both nostrils daily as needed.    Marland Kitchen glucose blood (ACCU-CHEK SMARTVIEW) test strip 1 each by Other route as needed for other. Use as instructed   . LINZESS 145 MCG CAPS capsule    . metoprolol succinate (TOPROL-XL) 25 MG 24 hr tablet Take 2 tablets (50 mg total) by mouth daily.   . Multiple Vitamins-Minerals (MULTIVITAMIN ADULT PO) Take 1 tablet by mouth daily.   Marland Kitchen nystatin (MYCOSTATIN/NYSTOP) powder Apply topically 4 (four) times daily.   . pantoprazole (PROTONIX) 40 MG tablet  Take 1 tablet (40 mg total) by mouth 2 (two) times daily.   . polyethylene glycol powder (GLYCOLAX/MIRALAX) powder Take by mouth.   . sitaGLIPtin-metformin (JANUMET) 50-500 MG tablet Take 1 tablet by mouth 2 (two) times daily with a meal.   . Suvorexant (BELSOMRA) 10 MG TABS Take 10 mg by mouth at bedtime as needed.   Marland Kitchen telmisartan-hydrochlorothiazide (MICARDIS HCT) 80-25 MG tablet Take 1 tablet by mouth daily.   Marland Kitchen UNABLE TO FIND Estriol 1 mg/g Use 1/4 app per vagina 1-2 times weekly Disp 30 g tube with 2 rf Called to [facility] 01/03/2018: compound medication for vaginal use  . azithromycin (ZITHROMAX) 250 MG tablet z-pack - take as directed for 5 days   . chlorpheniramine-HYDROcodone (TUSSIONEX PENNKINETIC ER) 10-8 MG/5ML SUER Take 5 mLs by mouth every 12 (twelve) hours as needed for cough.    No facility-administered encounter medications on file as of 03/25/2018.       Medical History: Past Medical History:  Diagnosis Date  . Anxiety   . Depression   . Diabetes mellitus without complication ([place])   . GERD (gastroesophageal reflux disease)   . Hypertension   . IBS (irritable bowel syndrome)   . Neuromuscular disorder ([place])      Today's Vitals   03/25/18 1354  BP: (!) 161/94  Pulse: 81  Temp: (!) 96.2 F (35.7 C)  SpO2: 96%  Weight: 241 lb (109.3 kg)  Height: 5\' 6"  (1.676 m)    Review of Systems  Constitutional: Positive for activity change, chills, fatigue and fever.  HENT: Positive for congestion, ear pain, postnasal drip, rhinorrhea, sinus pain, sore throat and voice change.   Respiratory: Positive for cough and wheezing.   Cardiovascular: Negative for chest pain and palpitations.       Elevated blood pressure today.   Gastrointestinal: Positive for nausea. Negative for vomiting.  Endocrine:       Blood sugars doing well    Musculoskeletal: Positive for myalgias.  Skin: Negative for rash.  Allergic/Immunologic: Positive for environmental allergies.  Neurological:  Positive for headaches.  Hematological: Positive for adenopathy.  Psychiatric/Behavioral: Positive for dysphoric mood. The patient is nervous/anxious.     Physical Exam  Constitutional: She is oriented to person, place, and time. She appears well-developed and well-nourished. She appears ill. No distress.  HENT:  Head: Normocephalic and atraumatic.  Right Ear: External ear normal.  Left Ear: External ear normal.  Nose: Rhinorrhea present. Right sinus exhibits maxillary sinus tenderness and frontal sinus tenderness. Left sinus exhibits maxillary sinus tenderness and frontal sinus tenderness.  Mouth/Throat: Posterior oropharyngeal edema and posterior oropharyngeal erythema present. No oropharyngeal exudate.  Eyes: Pupils are equal, round, and reactive to light. EOM are normal.  Neck: Normal range of motion. Neck supple. No JVD present. No tracheal deviation present. No thyromegaly present.  Cardiovascular: Normal rate, regular rhythm and normal heart sounds. Exam reveals no gallop and no friction rub.  No murmur heard. Pulmonary/Chest: Effort normal and breath sounds normal. No respiratory distress. She has no wheezes. She has no rales. She exhibits no tenderness.  Harsh, dry cough present   Abdominal: Soft. Bowel sounds are normal.  Musculoskeletal: Normal range of motion.  Lymphadenopathy:    She has cervical adenopathy.  Neurological: She is alert and oriented to person, place, and time. No cranial nerve deficit.  Skin: Skin is warm and dry. She is not diaphoretic.  Psychiatric: She has a normal mood and affect. Her behavior is normal. Judgment and thought content normal.  Nursing note and vitals reviewed.  Assessment/Plan: 1. Acute upper respiratory infection Start z-pack. Take as directed for 5 days. Rest and increase fluids. OTC medications should be used to alleviate symptoms.  - azithromycin (ZITHROMAX) 250 MG tablet; z-pack - take as directed for 5 days  Dispense: 6 tablet;  Refill: 0  2. Flu-like symptoms - POCT Influenza A/B negative. Will treat as upper respiratory infection.   3. Cough Add tussionex cough suppressant. May use twice daily as needed for cough. Advised she take with caution as it may cause dizziness or drowsiness.  - chlorpheniramine-HYDROcodone (TUSSIONEX PENNKINETIC ER) 10-8 MG/5ML SUER; Take 5 mLs by mouth every 12 (twelve) hours as needed for cough.  Dispense: 115 mL; Refill: 0  4. Essential hypertension Generally stable. Continue bp medication as prescribed   5. Type 2 diabetes mellitus without complication, without long-term current use of insulin ([place]) No changes to diabetic medications   General Counseling: Neytiri verbalizes understanding of the findings of todays visit and agrees with plan of treatment. I have discussed any further diagnostic evaluation that may be needed or ordered today. We also reviewed her medications today. she has been encouraged to call the office with any questions or concerns that should arise related to todays visit.    Counseling:   Rest and increase fluids. Continue using OTC medication to control symptoms.   This patient was seen by [name] FNP Collaboration  with Dr Lavera Guise as a part of collaborative care agreement  Orders Placed This Encounter  Procedures  . POCT Influenza A/B    Meds ordered this encounter  Medications  . azithromycin (ZITHROMAX) 250 MG tablet    Sig: z-pack - take as directed for 5 days    Dispense:  6 tablet    Refill:  0    Order Specific Question:   Supervising Provider    Answer:   Lavera Guise [place]  . chlorpheniramine-HYDROcodone (TUSSIONEX PENNKINETIC ER) 10-8 MG/5ML SUER    Sig: Take 5 mLs by mouth every 12 (twelve) hours as needed for cough.    Dispense:  115 mL    Refill:  0    Order Specific Question:   Supervising Provider    Answer:   Lavera Guise [[id number]]    Time spent: 25 Minutes

## 2018-04-03 ENCOUNTER — Telehealth: Payer: Self-pay | Admitting: Dietician

## 2018-04-03 NOTE — Telephone Encounter (Signed)
Patient called to cancel her appointment scheduled for tomorrow, 04/04/18, due to illness. She will reschedule when she is feeling better; reports struggling with respiratory illness since 03/13/18.

## 2018-04-04 ENCOUNTER — Ambulatory Visit: Payer: Medicare HMO | Admitting: Dietician

## 2018-04-14 ENCOUNTER — Ambulatory Visit: Payer: Self-pay | Admitting: Nurse Practitioner

## 2018-04-15 ENCOUNTER — Other Ambulatory Visit: Payer: Self-pay

## 2018-04-15 ENCOUNTER — Telehealth: Payer: Self-pay

## 2018-04-15 MED ORDER — FLUCONAZOLE 150 MG PO TABS: ORAL_TABLET | ORAL | 0 refills | Status: DC

## 2018-04-15 NOTE — Telephone Encounter (Signed)
Pt advised we  [facility]

## 2018-05-01 ENCOUNTER — Telehealth: Payer: Self-pay | Admitting: Dietician

## 2018-05-01 NOTE — Telephone Encounter (Signed)
Have not yet heard back from patient after cancelling her appointment for 04/04/18 due to illness; called her and left a voicemail message to check on her progress and whether she would like to reschedule; requested a call back.

## 2018-05-02 ENCOUNTER — Telehealth: Payer: Self-pay | Admitting: Dietician

## 2018-05-02 NOTE — Telephone Encounter (Signed)
Patient returned message to check on her progress. She reports working on exercise, water aerobics 2x a week + dog walking or walking dvd exercise several days a week.  She reports ongoing depression; appointment with counselor has been rescheduled to next week. She will schedule another MNT follow up after that visit.

## 2018-05-08 ENCOUNTER — Telehealth: Payer: Self-pay | Admitting: Internal Medicine

## 2018-05-08 NOTE — Telephone Encounter (Signed)
Called and left message telling patient that there are samples for her to pick up.jw

## 2018-05-09 ENCOUNTER — Ambulatory Visit: Payer: Self-pay | Admitting: Nurse Practitioner

## 2018-05-20 ENCOUNTER — Ambulatory Visit: Payer: Self-pay | Admitting: Nurse Practitioner

## 2018-05-23 ENCOUNTER — Other Ambulatory Visit: Payer: Self-pay

## 2018-05-23 DIAGNOSIS — K219 Gastro-esophageal reflux disease without esophagitis: Secondary | ICD-10-CM

## 2018-05-23 MED ORDER — PANTOPRAZOLE SODIUM 40 MG PO TBEC: 40.0000 mg | DELAYED_RELEASE_TABLET | Freq: Two times a day (BID) | ORAL | 3 refills | Status: DC

## 2018-06-03 ENCOUNTER — Ambulatory Visit: Payer: Self-pay | Admitting: Nurse Practitioner

## 2018-06-05 ENCOUNTER — Telehealth: Payer: Self-pay | Admitting: Nurse Practitioner

## 2018-06-05 ENCOUNTER — Other Ambulatory Visit: Payer: Self-pay | Admitting: Nurse Practitioner

## 2018-06-05 DIAGNOSIS — K644 Residual hemorrhoidal skin tags: Secondary | ICD-10-CM

## 2018-06-05 MED ORDER — PRAMOXINE-HC 1-2.5 % EX CREA: TOPICAL_CREAM | Freq: Three times a day (TID) | CUTANEOUS | 0 refills | Status: DC

## 2018-06-05 NOTE — Telephone Encounter (Signed)
Hemorrhoids flaring up and preparation H not working. Start pramoxine/HC which may be applied TID as needed. Sent to her pharmacy.

## 2018-06-05 NOTE — Telephone Encounter (Signed)
Patient called states that her hemmorrhoids flaring up and preparation h is not working can you please send something to help

## 2018-06-05 NOTE — Progress Notes (Signed)
Hemorrhoids flaring up and preparation H not working. Start pramoxine/HC which may be applied TID as needed. Sent to her pharmacy.

## 2018-06-09 ENCOUNTER — Other Ambulatory Visit: Payer: Self-pay

## 2018-06-10 ENCOUNTER — Other Ambulatory Visit: Payer: Self-pay

## 2018-06-10 DIAGNOSIS — I10 Essential (primary) hypertension: Secondary | ICD-10-CM

## 2018-06-10 MED ORDER — TELMISARTAN-HCTZ 80-25 MG PO TABS: 1.0000 | ORAL_TABLET | Freq: Every day | ORAL | 1 refills | Status: DC

## 2018-06-19 ENCOUNTER — Encounter: Payer: Self-pay | Admitting: Nurse Practitioner

## 2018-06-19 ENCOUNTER — Ambulatory Visit (INDEPENDENT_AMBULATORY_CARE_PROVIDER_SITE_OTHER): Payer: PPO | Admitting: Nurse Practitioner

## 2018-06-19 VITALS — BP 140/80 | HR 78 | Resp 16 | Ht 66.0 in | Wt 244.0 lb

## 2018-06-19 DIAGNOSIS — I10 Essential (primary) hypertension: Secondary | ICD-10-CM | POA: Diagnosis not present

## 2018-06-19 DIAGNOSIS — R300 Dysuria: Secondary | ICD-10-CM

## 2018-06-19 DIAGNOSIS — N390 Urinary tract infection, site not specified: Secondary | ICD-10-CM

## 2018-06-19 DIAGNOSIS — F411 Generalized anxiety disorder: Secondary | ICD-10-CM | POA: Diagnosis not present

## 2018-06-19 DIAGNOSIS — E1165 Type 2 diabetes mellitus with hyperglycemia: Secondary | ICD-10-CM | POA: Diagnosis not present

## 2018-06-19 LAB — POCT URINALYSIS DIPSTICK
BILIRUBIN UA: NEGATIVE
GLUCOSE UA: NEGATIVE
Ketones, UA: NEGATIVE
Nitrite, UA: NEGATIVE
Protein, UA: NEGATIVE
Spec Grav, UA: 1.01 (ref 1.010–1.025)
Urobilinogen, UA: 0.2 E.U./dL
pH, UA: 6 (ref 5.0–8.0)

## 2018-06-19 LAB — POCT GLYCOSYLATED HEMOGLOBIN (HGB A1C): HEMOGLOBIN A1C: 6.4 % — AB (ref 4.0–5.6)

## 2018-06-19 MED ORDER — BUPROPION HCL ER (SR) 100 MG PO TB12: 100.0000 mg | ORAL_TABLET | Freq: Every day | ORAL | 3 refills | Status: DC

## 2018-06-19 MED ORDER — NITROFURANTOIN MONOHYD MACRO 100 MG PO CAPS: 100.0000 mg | ORAL_CAPSULE | Freq: Two times a day (BID) | ORAL | 0 refills | Status: DC

## 2018-06-19 NOTE — Progress Notes (Signed)
[facility] [place], [place] [address]  Internal MEDICINE  Office Visit Note  Patient Name: Lisa Gutierrez  [id number]  [id number]  Date of Service: 07/02/2018  Chief Complaint  Patient presents with  . Diabetes  . Follow-up    Diabetes  She presents for her follow-up diabetic visit. She has type 2 diabetes mellitus. Her disease course has been stable. Hypoglycemia symptoms include nervousness/anxiousness. Pertinent negatives for hypoglycemia include no dizziness, headaches or tremors. Associated symptoms include fatigue. Pertinent negatives for diabetes include no chest pain, no polydipsia, no polyuria and no weakness. There are no hypoglycemic complications. Symptoms are stable. There are no diabetic complications. Risk factors for coronary artery disease include diabetes mellitus, dyslipidemia, hypertension, obesity and post-menopausal. Current diabetic treatment includes oral agent (dual therapy). She is compliant with treatment all of the time. Her weight is increasing steadily. She is following a generally healthy diet. Meal planning includes avoidance of concentrated sweets. She has had a previous visit with a dietitian. She participates in exercise three times a week. There is no change in her home blood glucose trend. An ACE inhibitor/angiotensin II receptor blocker is being taken. She does not see a podiatrist.Eye exam is current.  Depression         This is a chronic problem.  The current episode started 1 to 4 weeks ago.   The onset quality is undetermined.   The problem occurs constantly.  The problem has been gradually worsening since onset.  Associated symptoms include fatigue, hopelessness, restlessness, decreased interest and indigestion.  Associated symptoms include no headaches and no suicidal ideas.     The symptoms are aggravated by family issues and social issues.  Treatments tried: multiple medications tried and caused negative side effects .   Compliance with treatment is variable.  Past compliance problems include medication issues.  Risk factors include stress and marital problems.       Current Medication: Outpatient Encounter Medications as of 06/19/2018  Medication Sig Note  . acetaminophen (TYLENOL) 500 MG tablet Take 2 tablets by mouth as needed. 02/28/2016: Received from: [place]: 1-2 tablets by mouth single dose as needed for pain  . ALPRAZolam (XANAX) 0.5 MG tablet Take 1 tablet (0.5 mg total) by mouth 2 (two) times daily as needed for anxiety. As needed   . buPROPion (WELLBUTRIN SR) 100 MG 12 hr tablet Take 1 tablet (100 mg total) by mouth daily.   . Calcium Citrate-Vitamin D (CALCIUM + D PO) Take 1 tablet by mouth daily.   . chlorpheniramine-HYDROcodone (TUSSIONEX PENNKINETIC ER) 10-8 MG/5ML SUER Take 5 mLs by mouth every 12 (twelve) hours as needed for cough.   . Flaxseed, Linseed, (FLAXSEED OIL PO) Take 1,500 mg by mouth 2 (two) times daily.   . fluconazole (DIFLUCAN) 150 MG tablet Take 1  tab  Po once for yeast infection and repeat in 3 days if symptoms persist   . fluticasone (FLONASE) 50 MCG/ACT nasal spray Place 2 sprays into both nostrils daily as needed.    Marland Kitchen glucose blood (ACCU-CHEK SMARTVIEW) test strip 1 each by Other route as needed for other. Use as instructed   . LINZESS 145 MCG CAPS capsule    . metoprolol succinate (TOPROL-XL) 25 MG 24 hr tablet Take 2 tablets (50 mg total) by mouth daily.   . Multiple Vitamins-Minerals (MULTIVITAMIN ADULT PO) Take 1 tablet by mouth daily.   Marland Kitchen nystatin (MYCOSTATIN/NYSTOP) powder Apply topically 4 (four) times daily.   Marland Kitchen  pantoprazole (PROTONIX) 40 MG tablet Take 1 tablet (40 mg total) by mouth 2 (two) times daily.   . polyethylene glycol powder (GLYCOLAX/MIRALAX) powder Take by mouth.   . pramoxine-hydrocortisone cream Apply topically 3 (three) times daily.   . sitaGLIPtin-metformin (JANUMET) 50-500 MG tablet Take 1 tablet by mouth 2  (two) times daily with a meal.   . Suvorexant (BELSOMRA) 10 MG TABS Take 10 mg by mouth at bedtime as needed.   Marland Kitchen telmisartan-hydrochlorothiazide (MICARDIS HCT) 80-25 MG tablet Take 1 tablet by mouth daily.   Marland Kitchen UNABLE TO FIND Estriol 1 mg/g Use 1/4 app per vagina 1-2 times weekly Disp 30 g tube with 2 rf [facility] 01[facility]: compound medication for vaginal use  . [DISCONTINUED] azithromycin (ZITHROMAX) 250 MG tablet z-pack - take as directed for 5 days   . [DISCONTINUED] buPROPion (WELLBUTRIN SR) 100 MG 12 hr tablet Take 1 tablet (100 mg total) by mouth daily.   . nitrofurantoin, macrocrystal-monohydrate, (MACROBID) 100 MG capsule Take 1 capsule (100 mg total) by mouth 2 (two) times daily.    No facility-administered encounter medications on file as of 06/19/2018.     Surgical History: Past Surgical History:  Procedure Laterality Date  . CHOLECYSTECTOMY    . EXCISION / BIOPSY BREAST / NIPPLE / DUCT Right 1973   duct removed    Medical History: Past Medical History:  Diagnosis Date  . Anxiety   . Depression   . Diabetes mellitus without complication ([place])   . GERD (gastroesophageal reflux disease)   . Hypertension   . IBS (irritable bowel syndrome)   . Neuromuscular disorder ([place])     Family History: Family History  Problem Relation Age of Onset  . Diabetes Mother   . Hypertension Mother   . Coronary artery disease Father   . Glaucoma Father   . Breast cancer Neg Hx     Social History   Socioeconomic History  . Marital status: Married    Spouse name: Not on file  . Number of children: Not on file  . Years of education: Not on file  . Highest education level: Not on file  Occupational History  . Not on file  Social Needs  . Financial resource strain: Not on file  . Food insecurity:    Worry: Not on file    Inability: Not on file  . Transportation needs:    Medical: Not on file    Non-medical: Not on file  Tobacco Use  . Smoking status: Former Research scientist (life sciences)   . Smokeless tobacco: Never Used  Substance and Sexual Activity  . Alcohol use: No    Alcohol/week: 0.0 standard drinks  . Drug use: Never  . Sexual activity: Not on file  Lifestyle  . Physical activity:    Days per week: Not on file    Minutes per session: Not on file  . Stress: Not on file  Relationships  . Social connections:    Talks on phone: Not on file    Gets together: Not on file    Attends religious service: Not on file    Active member of club or organization: Not on file    Attends meetings of clubs or organizations: Not on file    Relationship status: Not on file  . Intimate partner violence:    Fear of current or ex partner: Not on file    Emotionally abused: Not on file    Physically abused: Not on file    Forced sexual  activity: Not on file  Other Topics Concern  . Not on file  Social History Narrative  . Not on file      Review of Systems  Constitutional: Positive for activity change and fatigue. Negative for chills and unexpected weight change.  HENT: Negative for congestion, postnasal drip, rhinorrhea, sneezing and sore throat.   Respiratory: Negative for cough, chest tightness and shortness of breath.   Cardiovascular: Negative for chest pain and palpitations.  Gastrointestinal: Positive for constipation and diarrhea. Negative for abdominal pain, nausea and vomiting.       Alternates between constipation and diarrhea.   Endocrine: Negative for cold intolerance, heat intolerance, polydipsia and polyuria.       Blood sugars doing well   Genitourinary: Negative for dysuria and frequency.       Vaginal itching with red rash present in groin area.   Musculoskeletal: Negative for arthralgias, back pain, joint swelling and neck pain.  Skin: Positive for rash.  Allergic/Immunologic: Negative for environmental allergies.  Neurological: Negative for dizziness, tremors, weakness, numbness and headaches.  Hematological: Negative for adenopathy. Does not  bruise/bleed easily.  Psychiatric/Behavioral: Positive for depression and dysphoric mood. Negative for behavioral problems (Depression), sleep disturbance and suicidal ideas. The patient is nervous/anxious.    Today's Vitals   06/19/18 1030  BP: 140/80  Pulse: 78  Resp: 16  SpO2: 97%  Weight: 244 lb (110.7 kg)  Height: 5\' 6"  (1.676 m)   Body mass index is 39.38 kg/m.   Physical Exam Vitals signs and nursing note reviewed.  Constitutional:      General: She is not in acute distress.    Appearance: Normal appearance. She is well-developed. She is obese. She is not diaphoretic.  HENT:     Head: Normocephalic and atraumatic.     Mouth/Throat:     Pharynx: No oropharyngeal exudate.  Eyes:     Conjunctiva/sclera: Conjunctivae normal.     Pupils: Pupils are equal, round, and reactive to light.  Neck:     Musculoskeletal: Normal range of motion and neck supple.     Thyroid: No thyromegaly.     Vascular: No carotid bruit or JVD.     Trachea: No tracheal deviation.  Cardiovascular:     Rate and Rhythm: Normal rate and regular rhythm.     Heart sounds: Normal heart sounds. No murmur. No friction rub. No gallop.   Pulmonary:     Effort: Pulmonary effort is normal.     Breath sounds: Normal breath sounds. No wheezing or rales.  Chest:     Chest wall: No tenderness.  Abdominal:     General: Bowel sounds are normal.     Palpations: Abdomen is soft.     Tenderness: There is no abdominal tenderness.  Musculoskeletal: Normal range of motion.  Lymphadenopathy:     Cervical: No cervical adenopathy.  Skin:    General: Skin is warm and dry.     Comments: Red and irritated rash in bilateral groin, mostly in folds of the skin.   Neurological:     Mental Status: She is alert and oriented to person, place, and time.     Cranial Nerves: No cranial nerve deficit.  Psychiatric:        Mood and Affect: Mood is anxious and depressed.        Speech: Speech normal.        Behavior: Behavior  normal.        Thought Content: Thought content normal. Thought content does  not include suicidal ideation.        Judgment: Judgment normal.    Assessment/Plan: 1. Type 2 diabetes mellitus with hyperglycemia, unspecified whether long term insulin use (HCC) - POCT HgB A1C 6.4 today. Continue diabetic medication as prescribed   2. Urinary tract infection without hematuria, site unspecified Start macrobid 100mg  bid for 7 days. Send urine for culture and sensitivity and adjust antibiotics as indicated.  - nitrofurantoin, macrocrystal-monohydrate, (MACROBID) 100 MG capsule; Take 1 capsule (100 mg total) by mouth 2 (two) times daily.  Dispense: 14 capsule; Refill: 0  3. GAD (generalized anxiety disorder) Strongly recommend she start wellbutrin SR 100mg  daily. Reassess at next visit.  - buPROPion (WELLBUTRIN SR) 100 MG 12 hr tablet; Take 1 tablet (100 mg total) by mouth daily.  Dispense: 30 tablet; Refill: 3  4. Essential hypertension Stable. Continue bp medication as prescribed .  5. Dysuria - POCT Urinalysis Dipstick - CULTURE, URINE COMPREHENSIVE  General Counseling: Meadow verbalizes understanding of the findings of todays visit and agrees with plan of treatment. I have discussed any further diagnostic evaluation that may be needed or ordered today. We also reviewed her medications today. she has been encouraged to call the office with any questions or concerns that should arise related to todays visit.  Diabetes Counseling:  1. Addition of ACE inh/ ARB'S for nephroprotection. Microalbumin is updated  2. Diabetic foot care, prevention of complications. Podiatry consult 3. Exercise and lose weight.  4. Diabetic eye examination, Diabetic eye exam is updated  5. Monitor blood sugar closlely. nutrition counseling.  6. Sign and symptoms of hypoglycemia including shaking sweating,confusion and headaches.  This patient was seen by [name] FNP Collaboration with Dr Lavera Guise as a  part of collaborative care agreement  Orders Placed This Encounter  Procedures  . CULTURE, URINE COMPREHENSIVE  . POCT HgB A1C  . POCT Urinalysis Dipstick    Meds ordered this encounter  Medications  . buPROPion (WELLBUTRIN SR) 100 MG 12 hr tablet    Sig: Take 1 tablet (100 mg total) by mouth daily.    Dispense:  30 tablet    Refill:  3    Please run through with GoodRx coupon. Will give patient GoodRx card.    Order Specific Question:   Supervising Provider    Answer:   Lavera Guise [[id number]]  . nitrofurantoin, macrocrystal-monohydrate, (MACROBID) 100 MG capsule    Sig: Take 1 capsule (100 mg total) by mouth 2 (two) times daily.    Dispense:  14 capsule    Refill:  0    Order Specific Question:   Supervising Provider    Answer:   Lavera Guise [[id number]]    Time spent: 65 Minutes      Dr Lavera Guise Internal medicine

## 2018-06-20 ENCOUNTER — Telehealth: Payer: Self-pay

## 2018-06-20 NOTE — Telephone Encounter (Signed)
PT WAS NOTIFIED. 

## 2018-06-20 NOTE — Telephone Encounter (Signed)
This should be taken in the morning

## 2018-06-23 LAB — CULTURE, URINE COMPREHENSIVE

## 2018-06-27 ENCOUNTER — Telehealth: Payer: Self-pay

## 2018-06-30 NOTE — Telephone Encounter (Signed)
She should increase the dosing. Take one in the morning and one in the afternoon. Needs to give this at least 10 days to two weeks to build up levels in her system. If it helps, I will change the prescription.

## 2018-06-30 NOTE — Telephone Encounter (Signed)
Called pt and notified pt

## 2018-07-02 DIAGNOSIS — E1165 Type 2 diabetes mellitus with hyperglycemia: Secondary | ICD-10-CM | POA: Insufficient documentation

## 2018-07-02 DIAGNOSIS — N390 Urinary tract infection, site not specified: Secondary | ICD-10-CM | POA: Insufficient documentation

## 2018-07-04 ENCOUNTER — Telehealth: Payer: Self-pay

## 2018-07-04 ENCOUNTER — Other Ambulatory Visit: Payer: Self-pay | Admitting: Nurse Practitioner

## 2018-07-04 DIAGNOSIS — F411 Generalized anxiety disorder: Secondary | ICD-10-CM

## 2018-07-04 MED ORDER — BUPROPION HCL ER (SR) 100 MG PO TB12: 100.0000 mg | ORAL_TABLET | Freq: Two times a day (BID) | ORAL | 3 refills | Status: DC

## 2018-07-04 NOTE — Progress Notes (Signed)
Increased bupropion rx to bid and increased tablets in rx. Sent to her pharmacy. She should go ahead and start this BID.

## 2018-07-04 NOTE — Telephone Encounter (Signed)
Increased bupropion rx to bid and increased tablets in rx. Sent to her pharmacy. She should go ahead and start this BID.

## 2018-07-04 NOTE — Telephone Encounter (Signed)
Pt was notified.  

## 2018-07-14 ENCOUNTER — Telehealth: Payer: Self-pay

## 2018-07-14 NOTE — Telephone Encounter (Signed)
Using zyrtec and flonase are both fine with bupropion. Use zyrtec mostly at night in case it causes drowsiness. I think she should give this a few days, as pollen is very high right now.

## 2018-07-14 NOTE — Telephone Encounter (Signed)
PT WAS NOTIFIED. 

## 2018-08-04 ENCOUNTER — Ambulatory Visit (INDEPENDENT_AMBULATORY_CARE_PROVIDER_SITE_OTHER): Payer: PPO | Admitting: Nurse Practitioner

## 2018-08-04 ENCOUNTER — Encounter: Payer: Self-pay | Admitting: Nurse Practitioner

## 2018-08-04 VITALS — Temp 97.3°F | Resp 16

## 2018-08-04 DIAGNOSIS — B373 Candidiasis of vulva and vagina: Secondary | ICD-10-CM | POA: Insufficient documentation

## 2018-08-04 DIAGNOSIS — F411 Generalized anxiety disorder: Secondary | ICD-10-CM | POA: Diagnosis not present

## 2018-08-04 DIAGNOSIS — J0140 Acute pansinusitis, unspecified: Secondary | ICD-10-CM | POA: Insufficient documentation

## 2018-08-04 DIAGNOSIS — B3731 Acute candidiasis of vulva and vagina: Secondary | ICD-10-CM | POA: Insufficient documentation

## 2018-08-04 MED ORDER — FLUCONAZOLE 150 MG PO TABS: ORAL_TABLET | ORAL | 0 refills | Status: DC

## 2018-08-04 MED ORDER — AZITHROMYCIN 250 MG PO TABS: ORAL_TABLET | ORAL | 0 refills | Status: DC

## 2018-08-04 MED ORDER — BUPROPION HCL ER (XL) 150 MG PO TB24: 150.0000 mg | ORAL_TABLET | Freq: Every day | ORAL | 3 refills | Status: DC

## 2018-08-04 NOTE — Progress Notes (Signed)
[facility] [place], [place] [address]  Internal MEDICINE  Telephone Visit  Patient Name: Lisa Gutierrez  [id number]  [id number]  Date of Service: 08/04/2018  I connected with the patient at 10:0000  by webcam and verified the patients identity using two identifiers.   I discussed the limitations, risks, security and privacy concerns of performing an evaluation and management service by webcam and the availability of in person appointments. I also discussed with the patient that there may be a patient responsible charge related to the service.  The patient expressed understanding and agrees to proceed.    Chief Complaint  Patient presents with  . Telephone Assessment  . Telephone Screen  . Cough  . Sinusitis  . Ear Pain    The patient has been contacted via webcam  for follow up visit due to concerns for spread of novel coronavirus. She is complaining of sinus congestion for past three weeks. Left ears is painful. Sometimes, pain is sharp in the ear. Has headaches and sneezing. Also has scratchy throat. Denies fever or body aches. Has been treating with zyrtec and flonase. This has not helped to control symptoms.  She states that she has also noted some blurry vision after she started taking the second dose of her wellbutrin. Is unsure if this is related to higher dose or if this more related to allergies and sinus infection.       Current Medication: Outpatient Encounter Medications as of 08/04/2018  Medication Sig Note  . acetaminophen (TYLENOL) 500 MG tablet Take 2 tablets by mouth as needed. 02/28/2016: Received from: [place]: 1-2 tablets by mouth single dose as needed for pain  . ALPRAZolam (XANAX) 0.5 MG tablet Take 1 tablet (0.5 mg total) by mouth 2 (two) times daily as needed for anxiety. As needed   . buPROPion (WELLBUTRIN SR) 100 MG 12 hr tablet Take 1 tablet (100 mg total) by mouth 2 (two) times daily.    . Calcium Citrate-Vitamin D (CALCIUM + D PO) Take 1 tablet by mouth daily.   . chlorpheniramine-HYDROcodone (TUSSIONEX PENNKINETIC ER) 10-8 MG/5ML SUER Take 5 mLs by mouth every 12 (twelve) hours as needed for cough.   . Flaxseed, Linseed, (FLAXSEED OIL PO) Take 1,500 mg by mouth 2 (two) times daily.   . fluconazole (DIFLUCAN) 150 MG tablet Take 1  tab  Po once for yeast infection and repeat in 3 days if symptoms persist   . fluticasone (FLONASE) 50 MCG/ACT nasal spray Place 2 sprays into both nostrils daily as needed.    Marland Kitchen glucose blood (ACCU-CHEK SMARTVIEW) test strip 1 each by Other route as needed for other. Use as instructed   . LINZESS 145 MCG CAPS capsule    . metoprolol succinate (TOPROL-XL) 25 MG 24 hr tablet Take 2 tablets (50 mg total) by mouth daily.   . Multiple Vitamins-Minerals (MULTIVITAMIN ADULT PO) Take 1 tablet by mouth daily.   . nitrofurantoin, macrocrystal-monohydrate, (MACROBID) 100 MG capsule Take 1 capsule (100 mg total) by mouth 2 (two) times daily.   Marland Kitchen nystatin (MYCOSTATIN/NYSTOP) powder Apply topically 4 (four) times daily.   . pantoprazole (PROTONIX) 40 MG tablet Take 1 tablet (40 mg total) by mouth 2 (two) times daily.   . polyethylene glycol powder (GLYCOLAX/MIRALAX) powder Take by mouth.   . pramoxine-hydrocortisone cream Apply topically 3 (three) times daily.   . sitaGLIPtin-metformin (JANUMET) 50-500 MG tablet Take 1 tablet by mouth 2 (two) times daily with  a meal.   . Suvorexant (BELSOMRA) 10 MG TABS Take 10 mg by mouth at bedtime as needed.   Marland Kitchen telmisartan-hydrochlorothiazide (MICARDIS HCT) 80-25 MG tablet Take 1 tablet by mouth daily.   Marland Kitchen UNABLE TO FIND Estriol 1 mg/g Use 1/4 app per vagina 1-2 times weekly Disp 30 g tube with 2 rf [facility] 01/03/2018: compound medication for vaginal use   No facility-administered encounter medications on file as of 08/04/2018.     Surgical History: Past Surgical History:  Procedure Laterality Date  .  CHOLECYSTECTOMY    . EXCISION / BIOPSY BREAST / NIPPLE / DUCT Right 1973   duct removed    Medical History: Past Medical History:  Diagnosis Date  . Anxiety   . Depression   . Diabetes mellitus without complication ([place])   . GERD (gastroesophageal reflux disease)   . Hypertension   . IBS (irritable bowel syndrome)   . Neuromuscular disorder ([place])     Family History: Family History  Problem Relation Age of Onset  . Diabetes Mother   . Hypertension Mother   . Coronary artery disease Father   . Glaucoma Father   . Breast cancer Neg Hx     Social History   Socioeconomic History  . Marital status: Married    Spouse name: Not on file  . Number of children: Not on file  . Years of education: Not on file  . Highest education level: Not on file  Occupational History  . Not on file  Social Needs  . Financial resource strain: Not on file  . Food insecurity:    Worry: Not on file    Inability: Not on file  . Transportation needs:    Medical: Not on file    Non-medical: Not on file  Tobacco Use  . Smoking status: Former Research scientist (life sciences)  . Smokeless tobacco: Never Used  Substance and Sexual Activity  . Alcohol use: No    Alcohol/week: 0.0 standard drinks  . Drug use: Never  . Sexual activity: Not on file  Lifestyle  . Physical activity:    Days per week: Not on file    Minutes per session: Not on file  . Stress: Not on file  Relationships  . Social connections:    Talks on phone: Not on file    Gets together: Not on file    Attends religious service: Not on file    Active member of club or organization: Not on file    Attends meetings of clubs or organizations: Not on file    Relationship status: Not on file  . Intimate partner violence:    Fear of current or ex partner: Not on file    Emotionally abused: Not on file    Physically abused: Not on file    Forced sexual activity: Not on file  Other Topics Concern  . Not on file  Social History Narrative  . Not on file       Review of Systems  Constitutional: Positive for chills and fatigue. Negative for fever.  HENT: Positive for congestion, ear pain, postnasal drip, rhinorrhea, sinus pain, sore throat and voice change.   Eyes: Positive for discharge.       Blurry vision  Respiratory: Positive for cough and wheezing.   Cardiovascular: Negative for chest pain and palpitations.  Genitourinary:       Will generally get yeast infection when she takes antibiotics.   Allergic/Immunologic: Positive for environmental allergies.  Neurological: Positive for headaches.  Psychiatric/Behavioral: The patient is nervous/anxious.    Today's Vitals   08/04/18 0921  Resp: 16  Temp: (!) 97.3 F (36.3 C)   There is no height or weight on file to calculate BMI.  Observation/Objective:  The patient is alert and oriented. She is nasally congested and appears to be ill. She is in no acute distress at this time.    Assessment/Plan: 1. Acute non-recurrent pansinusitis Start z-pack. Take as directed for five days. May continue OTC medication to alleviate symptoms.  - azithromycin (ZITHROMAX) 250 MG tablet; z-pack - take as directed for 5 days  Dispense: 6 tablet; Refill: 0  2. Vaginal candidiasis - fluconazole (DIFLUCAN) 150 MG tablet; Take 1  tab  Po once for yeast infection and repeat in 3 days if symptoms persist  Dispense: 3 tablet; Refill: 0  3. GAD (generalized anxiety disorder) Change bupropion to XL 150mg  once daily. Will have her contact me if visual disturbance continues.  - buPROPion (WELLBUTRIN XL) 150 MG 24 hr tablet; Take 1 tablet (150 mg total) by mouth daily.  Dispense: 30 tablet; Refill: 3  General Counseling: Britzy verbalizes understanding of the findings of today's phone visit and agrees with plan of treatment. I have discussed any further diagnostic evaluation that may be needed or ordered today. We also reviewed her medications today. she has been encouraged to call the office with any  questions or concerns that should arise related to todays visit.   Rest and increase fluids. Continue using OTC medication to control symptoms.   This patient was seen by [name] FNP Collaboration with Dr Lavera Guise as a part of collaborative care agreement  Time spent: 35 Minutes    Dr Lavera Guise Internal medicine

## 2018-08-11 ENCOUNTER — Telehealth: Payer: Self-pay | Admitting: Nurse Practitioner

## 2018-08-11 ENCOUNTER — Other Ambulatory Visit: Payer: Self-pay | Admitting: Nurse Practitioner

## 2018-08-11 DIAGNOSIS — H60313 Diffuse otitis externa, bilateral: Secondary | ICD-10-CM

## 2018-08-11 DIAGNOSIS — J0140 Acute pansinusitis, unspecified: Secondary | ICD-10-CM

## 2018-08-11 MED ORDER — AZITHROMYCIN 250 MG PO TABS: ORAL_TABLET | ORAL | 0 refills | Status: DC

## 2018-08-11 MED ORDER — HYDROCORTISONE-ACETIC ACID 1-2 % OT SOLN: 3.0000 [drp] | Freq: Three times a day (TID) | OTIC | 0 refills | Status: DC

## 2018-08-11 NOTE — Telephone Encounter (Signed)
Still having sinus issues after completing first round z-pack. Will repeat this - take as directed for 5 days. Sent to her pharmacy. Continue all meds for symptoms management as needed and as indicated .

## 2018-08-11 NOTE — Progress Notes (Signed)
Still having sinus issues after completing first round z-pack. Will repeat this - take as directed for 5 days. Sent to her pharmacy. Continue all meds for symptoms management as needed and as indicated .

## 2018-08-11 NOTE — Telephone Encounter (Signed)
PATIENT CALLED AND STATES THAT SHE COMPLETED ANTIBIOTIC ON Friday , SHE STILL HAVING PAIN IN THE EAR,WHICH IS INTERMITTENT PAIN, STILL CONGESTION IN CRUD IN HER FACE, PATIENT STATES THAT SHE IS USING FLONASE , ZYRTEC , AND SINUS FLUSH. SHE DOES FEEL BETTER , BUT SHE DIDN'T KNOW IF YOU WANTED HER TO DO ANOTHER ROUND OF ANTIBIOTICS.

## 2018-08-11 NOTE — Telephone Encounter (Signed)
She would like to know if there is a ear drop she can use ,

## 2018-08-11 NOTE — Progress Notes (Signed)
Sent prescription for hydrocortisone/acetic acid ear drops. She can put 3 drops in both ears three times daily for next 7 days or so sent to her pharmacy.

## 2018-08-11 NOTE — Telephone Encounter (Signed)
Sent prescription for hydrocortisone/acetic acid ear drops. She can put 3 drops in both ears three times daily for next 7 days or so sent to her pharmacy.

## 2018-08-21 ENCOUNTER — Other Ambulatory Visit: Payer: Self-pay | Admitting: Nurse Practitioner

## 2018-08-21 DIAGNOSIS — I10 Essential (primary) hypertension: Secondary | ICD-10-CM

## 2018-08-21 MED ORDER — METOPROLOL SUCCINATE ER 25 MG PO TB24: 50.0000 mg | ORAL_TABLET | Freq: Every day | ORAL | 4 refills | Status: DC

## 2018-09-15 ENCOUNTER — Ambulatory Visit: Payer: PPO | Admitting: Nurse Practitioner

## 2018-09-15 ENCOUNTER — Other Ambulatory Visit: Payer: Self-pay

## 2018-09-29 NOTE — Progress Notes (Signed)
The patient was not seen.   

## 2018-10-09 DIAGNOSIS — E119 Type 2 diabetes mellitus without complications: Secondary | ICD-10-CM | POA: Diagnosis not present

## 2018-10-14 ENCOUNTER — Other Ambulatory Visit: Payer: Self-pay

## 2018-10-14 MED ORDER — ACCU-CHEK SMARTVIEW VI STRP: 1.0000 | ORAL_STRIP | 11 refills | Status: DC | PRN

## 2018-10-23 ENCOUNTER — Ambulatory Visit: Payer: Self-pay | Admitting: Nurse Practitioner

## 2018-10-28 ENCOUNTER — Other Ambulatory Visit: Payer: Self-pay

## 2018-10-28 MED ORDER — LINZESS 145 MCG PO CAPS: ORAL_CAPSULE | ORAL | 1 refills | Status: DC

## 2018-11-10 ENCOUNTER — Other Ambulatory Visit: Payer: Self-pay

## 2018-11-10 DIAGNOSIS — B372 Candidiasis of skin and nail: Secondary | ICD-10-CM

## 2018-11-10 MED ORDER — NYSTATIN 100000 UNIT/GM EX POWD: Freq: Four times a day (QID) | CUTANEOUS | 3 refills | Status: DC

## 2018-11-18 ENCOUNTER — Encounter: Payer: Self-pay | Admitting: Nurse Practitioner

## 2018-11-24 ENCOUNTER — Ambulatory Visit: Payer: Self-pay | Admitting: Nurse Practitioner

## 2018-12-02 DIAGNOSIS — L281 Prurigo nodularis: Secondary | ICD-10-CM | POA: Diagnosis not present

## 2018-12-02 DIAGNOSIS — L538 Other specified erythematous conditions: Secondary | ICD-10-CM | POA: Diagnosis not present

## 2018-12-02 DIAGNOSIS — L298 Other pruritus: Secondary | ICD-10-CM | POA: Diagnosis not present

## 2018-12-02 DIAGNOSIS — L738 Other specified follicular disorders: Secondary | ICD-10-CM | POA: Diagnosis not present

## 2018-12-02 DIAGNOSIS — L820 Inflamed seborrheic keratosis: Secondary | ICD-10-CM | POA: Diagnosis not present

## 2018-12-02 DIAGNOSIS — D485 Neoplasm of uncertain behavior of skin: Secondary | ICD-10-CM | POA: Diagnosis not present

## 2018-12-12 DIAGNOSIS — H43811 Vitreous degeneration, right eye: Secondary | ICD-10-CM | POA: Diagnosis not present

## 2018-12-15 ENCOUNTER — Encounter: Payer: Self-pay | Admitting: Nurse Practitioner

## 2018-12-15 ENCOUNTER — Ambulatory Visit (INDEPENDENT_AMBULATORY_CARE_PROVIDER_SITE_OTHER): Payer: PPO | Admitting: Nurse Practitioner

## 2018-12-15 ENCOUNTER — Other Ambulatory Visit: Payer: Self-pay | Admitting: Nurse Practitioner

## 2018-12-15 ENCOUNTER — Other Ambulatory Visit: Payer: Self-pay

## 2018-12-15 VITALS — Temp 98.5°F | Ht 66.0 in

## 2018-12-15 DIAGNOSIS — E1165 Type 2 diabetes mellitus with hyperglycemia: Secondary | ICD-10-CM

## 2018-12-15 DIAGNOSIS — I10 Essential (primary) hypertension: Secondary | ICD-10-CM

## 2018-12-15 DIAGNOSIS — Z1231 Encounter for screening mammogram for malignant neoplasm of breast: Secondary | ICD-10-CM

## 2018-12-15 DIAGNOSIS — F411 Generalized anxiety disorder: Secondary | ICD-10-CM | POA: Diagnosis not present

## 2018-12-15 MED ORDER — FLUOXETINE HCL 10 MG PO TABS: 10.0000 mg | ORAL_TABLET | Freq: Every day | ORAL | 3 refills | Status: DC

## 2018-12-15 MED ORDER — ALPRAZOLAM 0.5 MG PO TABS: 0.5000 mg | ORAL_TABLET | Freq: Two times a day (BID) | ORAL | 2 refills | Status: DC | PRN

## 2018-12-15 NOTE — Progress Notes (Signed)
[facility] [place],  [address]  Internal MEDICINE  Telephone Visit  Patient Name: Lisa Gutierrez  [id number]  [id number]  Date of Service: 12/17/2018  I connected with the patient at 1:11pm by telephone and verified the patients identity using two identifiers.   I discussed the limitations, risks, security and privacy concerns of performing an evaluation and management service by telephone and the availability of in person appointments. I also discussed with the patient that there may be a patient responsible charge related to the service.  The patient expressed understanding and agrees to proceed.    Chief Complaint  Patient presents with  . Follow-up  . Diabetes  . Gastroesophageal Reflux  . Hypertension  . Anxiety  . Depression  . Medication Management    pt stopped taking wellbutrin gave her hot flashes, blurred vision, and made pt feel more depressed, pt still has spots in her eyes    The patient has been contacted via telephone for follow up visit due to concerns for spread of novel coronavirus. She is very depressed and tearful on the phone. She feels as though she has no one to talk to. Feels very alone. With spread of COVID 19, she feels even more isolated than usual. She cries frequently. We have tried multiple antidepressants in the past. Most recently, we have tried wellbutrin. She states that she did try this medication and states that she felt more depressed on this medication than off. She states that her blood sugars continue to run very good. Generally under 100. Blood pressure is well controlled.       Current Medication: Outpatient Encounter Medications as of 12/15/2018  Medication Sig Note  . acetaminophen (TYLENOL) 500 MG tablet Take 2 tablets by mouth as needed. 02/28/2016: Received from: [facility]: 1-2 tablets by mouth single dose as needed for pain  . acetic acid-hydrocortisone  (VOSOL-HC) OTIC solution Place 3 drops into both ears 3 (three) times daily.   Marland Kitchen ALPRAZolam (XANAX) 0.5 MG tablet Take 1 tablet (0.5 mg total) by mouth 2 (two) times daily as needed for anxiety.   . Calcium Citrate-Vitamin D (CALCIUM + D PO) Take 1 tablet by mouth daily.   . Flaxseed, Linseed, (FLAXSEED OIL PO) Take 1,500 mg by mouth 2 (two) times daily.   . fluconazole (DIFLUCAN) 150 MG tablet Take 1  tab  Po once for yeast infection and repeat in 3 days if symptoms persist   . fluticasone (FLONASE) 50 MCG/ACT nasal spray Place 2 sprays into both nostrils daily as needed.    Marland Kitchen glucose blood (ACCU-CHEK SMARTVIEW) test strip 1 each by Other route as needed for other. Use as instructed   . LINZESS 145 MCG CAPS capsule Take 1 tab po daily   . metoprolol succinate (TOPROL-XL) 25 MG 24 hr tablet Take 2 tablets (50 mg total) by mouth daily.   . Multiple Vitamins-Minerals (MULTIVITAMIN ADULT PO) Take 1 tablet by mouth daily.   Marland Kitchen nystatin (MYCOSTATIN/NYSTOP) powder Apply topically 4 (four) times daily.   . pantoprazole (PROTONIX) 40 MG tablet Take 1 tablet (40 mg total) by mouth 2 (two) times daily.   . polyethylene glycol powder (GLYCOLAX/MIRALAX) powder Take by mouth.   . pramoxine-hydrocortisone cream Apply topically 3 (three) times daily.   . sitaGLIPtin-metformin (JANUMET) 50-500 MG tablet Take 1 tablet by mouth 2 (two) times daily with a meal.   . Suvorexant (BELSOMRA) 10 MG TABS Take 10 mg by mouth  at bedtime as needed.   Marland Kitchen telmisartan-hydrochlorothiazide (MICARDIS HCT) 80-25 MG tablet Take 1 tablet by mouth daily.   Marland Kitchen UNABLE TO FIND Estriol 1 mg/g Use 1/4 app per vagina 1-2 times weekly Disp 30 g tube with 2 rf Called to [facility] 01/03/2018: compound medication for vaginal use  . [DISCONTINUED] ALPRAZolam (XANAX) 0.5 MG tablet Take 1 tablet (0.5 mg total) by mouth 2 (two) times daily as needed for anxiety. As needed   . [DISCONTINUED] azithromycin (ZITHROMAX) 250 MG tablet z-pack - take as  directed for 5 days   . [DISCONTINUED] buPROPion (WELLBUTRIN XL) 150 MG 24 hr tablet Take 1 tablet (150 mg total) by mouth daily.   . [DISCONTINUED] chlorpheniramine-HYDROcodone (TUSSIONEX PENNKINETIC ER) 10-8 MG/5ML SUER Take 5 mLs by mouth every 12 (twelve) hours as needed for cough.   . [DISCONTINUED] nitrofurantoin, macrocrystal-monohydrate, (MACROBID) 100 MG capsule Take 1 capsule (100 mg total) by mouth 2 (two) times daily.   Marland Kitchen FLUoxetine (PROZAC) 10 MG tablet Take 1 tablet (10 mg total) by mouth daily.    No facility-administered encounter medications on file as of 12/15/2018.     Surgical History: Past Surgical History:  Procedure Laterality Date  . CHOLECYSTECTOMY    . EXCISION / BIOPSY BREAST / NIPPLE / DUCT Right 1973   duct removed    Medical History: Past Medical History:  Diagnosis Date  . Anxiety   . Depression   . Diabetes mellitus without complication ([place])   . GERD (gastroesophageal reflux disease)   . Hypertension   . IBS (irritable bowel syndrome)   . Neuromuscular disorder ([place])     Family History: Family History  Problem Relation Age of Onset  . Diabetes Mother   . Hypertension Mother   . Coronary artery disease Father   . Glaucoma Father   . Breast cancer Neg Hx     Social History   Socioeconomic History  . Marital status: Married    Spouse name: Not on file  . Number of children: Not on file  . Years of education: Not on file  . Highest education level: Not on file  Occupational History  . Not on file  Social Needs  . Financial resource strain: Not on file  . Food insecurity    Worry: Not on file    Inability: Not on file  . Transportation needs    Medical: Not on file    Non-medical: Not on file  Tobacco Use  . Smoking status: Former Research scientist (life sciences)  . Smokeless tobacco: Never Used  Substance and Sexual Activity  . Alcohol use: No    Alcohol/week: 0.0 standard drinks  . Drug use: Never  . Sexual activity: Not on file  Lifestyle  .  Physical activity    Days per week: Not on file    Minutes per session: Not on file  . Stress: Not on file  Relationships  . Social Herbalist on phone: Not on file    Gets together: Not on file    Attends religious service: Not on file    Active member of club or organization: Not on file    Attends meetings of clubs or organizations: Not on file    Relationship status: Not on file  . Intimate partner violence    Fear of current or ex partner: Not on file    Emotionally abused: Not on file    Physically abused: Not on file    Forced sexual activity: Not on  file  Other Topics Concern  . Not on file  Social History Narrative  . Not on file      Review of Systems  Constitutional: Positive for fatigue. Negative for activity change, chills and unexpected weight change.  HENT: Negative for congestion, postnasal drip, rhinorrhea, sneezing and sore throat.   Respiratory: Negative for cough, chest tightness and shortness of breath.   Cardiovascular: Negative for chest pain and palpitations.  Gastrointestinal: Positive for constipation and diarrhea. Negative for abdominal pain, nausea and vomiting.       Alternates between constipation and diarrhea.   Endocrine: Negative for cold intolerance, heat intolerance, polydipsia and polyuria.       Blood sugars doing well   Genitourinary: Negative for dysuria and frequency.  Musculoskeletal: Negative for arthralgias, back pain, joint swelling and neck pain.  Skin: Positive for rash.  Allergic/Immunologic: Negative for environmental allergies.  Neurological: Negative for dizziness, tremors, weakness, numbness and headaches.  Hematological: Negative for adenopathy. Does not bruise/bleed easily.  Psychiatric/Behavioral: Positive for dysphoric mood. Negative for behavioral problems (Depression), sleep disturbance and suicidal ideas. The patient is nervous/anxious.    Today's Vitals   12/15/18 1042  Temp: 98.5 F (36.9 C)  Height:  5\' 6"  (1.676 m)   Body mass index is 39.38 kg/m.  Observation/Objective:    The patient is alert and oriented. She is pleasant and answers all questions appropriately. Breathing is non-labored. She is in no acute distress at this time. She sounds very depressed. She is tearful throughout the phone call. She denies feeling suicidal at any time.    Assessment/Plan: 1. Type 2 diabetes mellitus with hyperglycemia, without long-term current use of insulin (HCC) Per patient, blood sugars dong well. Continue Janumet as prescribed   2. Essential hypertension Stable. Continue bp medication as prescribed   3. GAD (generalized anxiety disorder) Start fluoxetine 10mg  daily. May continue alprazolam 0.5mg  twice daily if needed for acute anxiety. Encouraged patient to contact counseling services to make appointment for further evaluation and treatment. Recommendations made in the past with contact information given.  - FLUoxetine (PROZAC) 10 MG tablet; Take 1 tablet (10 mg total) by mouth daily.  Dispense: 30 tablet; Refill: 3 - ALPRAZolam (XANAX) 0.5 MG tablet; Take 1 tablet (0.5 mg total) by mouth 2 (two) times daily as needed for anxiety.  Dispense: 60 tablet; Refill: 2  General Counseling: Massiel verbalizes understanding of the findings of today's phone visit and agrees with plan of treatment. I have discussed any further diagnostic evaluation that may be needed or ordered today. We also reviewed her medications today. she has been encouraged to call the office with any questions or concerns that should arise related to todays visit.   Diabetes Counseling:  1. Addition of ACE inh/ ARB'S for nephroprotection. Microalbumin is updated  2. Diabetic foot care, prevention of complications. Podiatry consult 3. Exercise and lose weight.  4. Diabetic eye examination, Diabetic eye exam is updated  5. Monitor blood sugar closlely. nutrition counseling.  6. Sign and symptoms of hypoglycemia including  shaking sweating,confusion and headaches.   This patient was seen by [place] with Dr Lavera Guise as a part of collaborative care agreement  Meds ordered this encounter  Medications  . FLUoxetine (PROZAC) 10 MG tablet    Sig: Take 1 tablet (10 mg total) by mouth daily.    Dispense:  30 tablet    Refill:  3    Order Specific Question:   Supervising Provider  Answer:   Lavera Guise [facility]  . ALPRAZolam (XANAX) 0.5 MG tablet    Sig: Take 1 tablet (0.5 mg total) by mouth 2 (two) times daily as needed for anxiety.    Dispense:  60 tablet    Refill:  2    Order Specific Question:   Supervising Provider    Answer:   Lavera Guise [id number]    Time spent: 8 Minutes    Dr Lavera Guise Internal medicine

## 2018-12-19 ENCOUNTER — Other Ambulatory Visit: Payer: Self-pay

## 2018-12-19 MED ORDER — LINZESS 145 MCG PO CAPS: ORAL_CAPSULE | ORAL | 1 refills | Status: DC

## 2019-01-14 ENCOUNTER — Encounter: Payer: Self-pay | Admitting: Nurse Practitioner

## 2019-01-14 ENCOUNTER — Other Ambulatory Visit: Payer: Self-pay

## 2019-01-14 ENCOUNTER — Ambulatory Visit (INDEPENDENT_AMBULATORY_CARE_PROVIDER_SITE_OTHER): Payer: PPO | Admitting: Nurse Practitioner

## 2019-01-14 VITALS — Temp 97.5°F

## 2019-01-14 DIAGNOSIS — F411 Generalized anxiety disorder: Secondary | ICD-10-CM | POA: Diagnosis not present

## 2019-01-14 DIAGNOSIS — J0140 Acute pansinusitis, unspecified: Secondary | ICD-10-CM | POA: Diagnosis not present

## 2019-01-14 DIAGNOSIS — B3731 Acute candidiasis of vulva and vagina: Secondary | ICD-10-CM

## 2019-01-14 DIAGNOSIS — B373 Candidiasis of vulva and vagina: Secondary | ICD-10-CM | POA: Diagnosis not present

## 2019-01-14 MED ORDER — FLUCONAZOLE 150 MG PO TABS: ORAL_TABLET | ORAL | 0 refills | Status: DC

## 2019-01-14 MED ORDER — AZITHROMYCIN 250 MG PO TABS: ORAL_TABLET | ORAL | 0 refills | Status: DC

## 2019-01-14 NOTE — Progress Notes (Signed)
[facility] [place], [place] [address]  Internal MEDICINE  Telephone Visit  Patient Name: Lisa Gutierrez  [id number]  [id number]  Date of Service: 01/14/2019  I connected with the patient at 12:58pm  by webcam and verified the patients identity using two identifiers.   I discussed the limitations, risks, security and privacy concerns of performing an evaluation and management service by webcam and the availability of in person appointments. I also discussed with the patient that there may be a patient responsible charge related to the service.  The patient expressed understanding and agrees to proceed.    Chief Complaint  Patient presents with  . Telephone Assessment  . Telephone Screen  . Sinusitis  . Headache  . Blurred Vision  . Medical Management of Chronic Issues    pt stopped fluoxetine     The patient has been contacted via webcam for follow up visit due to concerns for spread of novel coronavirus.  The patient is complaining of nasal congestion, frontal headache, right eye pain, and ear pain, and watery eyes. She has been using zyrtec 10mg , taking 1/2 tablet twice daily, she has been using netty pot daiy. Started a few weeks ago she has taken z-pack in the past and has done well. She is allergic to Rag weed and it is heavy ragweed pollen the past few weeks. She denies fever or chills. She denies nausea or vomiting. She also states that she trimed taking fluoxetine. took it for two days, but made her vision blurry. She read that this is a possible side effect, and stopped taking it. She continues to be very depressed and cries often. She has checked into local mental health providers. She found that her insurance is taken by [facility]. Would like to have referral to them for further evaluation and treatment of depression and anxiety.       Current Medication: Outpatient Encounter Medications as of 01/14/2019  Medication Sig  Note  . acetaminophen (TYLENOL) 500 MG tablet Take 2 tablets by mouth as needed. 02/28/2016: Received from: [facility]: 1-2 tablets by mouth single dose as needed for pain  . acetic acid-hydrocortisone (VOSOL-HC) OTIC solution Place 3 drops into both ears 3 (three) times daily.   Marland Kitchen ALPRAZolam (XANAX) 0.5 MG tablet Take 1 tablet (0.5 mg total) by mouth 2 (two) times daily as needed for anxiety.   . Calcium Citrate-Vitamin D (CALCIUM + D PO) Take 1 tablet by mouth daily.   . Flaxseed, Linseed, (FLAXSEED OIL PO) Take 1,500 mg by mouth 2 (two) times daily.   . fluconazole (DIFLUCAN) 150 MG tablet Take 1  tab  Po once for yeast infection and repeat in 3 days if symptoms persist   . fluticasone (FLONASE) 50 MCG/ACT nasal spray Place 2 sprays into both nostrils daily as needed.    Marland Kitchen glucose blood (ACCU-CHEK SMARTVIEW) test strip 1 each by Other route as needed for other. Use as instructed   . LINZESS 145 MCG CAPS capsule Take 1 tab po daily   . metoprolol succinate (TOPROL-XL) 25 MG 24 hr tablet Take 2 tablets (50 mg total) by mouth daily.   . Multiple Vitamins-Minerals (MULTIVITAMIN ADULT PO) Take 1 tablet by mouth daily.   Marland Kitchen nystatin (MYCOSTATIN/NYSTOP) powder Apply topically 4 (four) times daily.   . pantoprazole (PROTONIX) 40 MG tablet Take 1 tablet (40 mg total) by mouth 2 (two) times daily.   . polyethylene glycol powder (GLYCOLAX/MIRALAX) powder  Take by mouth.   . pramoxine-hydrocortisone cream Apply topically 3 (three) times daily.   . sitaGLIPtin-metformin (JANUMET) 50-500 MG tablet Take 1 tablet by mouth 2 (two) times daily with a meal.   . Suvorexant (BELSOMRA) 10 MG TABS Take 10 mg by mouth at bedtime as needed.   Marland Kitchen telmisartan-hydrochlorothiazide (MICARDIS HCT) 80-25 MG tablet Take 1 tablet by mouth daily.   Marland Kitchen UNABLE TO FIND Estriol 1 mg/g Use 1/4 app per vagina 1-2 times weekly Disp 30 g tube with 2 rf Called to [facility] 01/03/2018: compound medication for  vaginal use  . [DISCONTINUED] fluconazole (DIFLUCAN) 150 MG tablet Take 1  tab  Po once for yeast infection and repeat in 3 days if symptoms persist   . azithromycin (ZITHROMAX) 250 MG tablet Take 2 tablets po on day one. Take 1 tablet po daily from day two through day 10.   Marland Kitchen FLUoxetine (PROZAC) 10 MG tablet Take 1 tablet (10 mg total) by mouth daily. (Patient not taking: Reported on 01/14/2019)    No facility-administered encounter medications on file as of 01/14/2019.     Surgical History: Past Surgical History:  Procedure Laterality Date  . CHOLECYSTECTOMY    . EXCISION / BIOPSY BREAST / NIPPLE / DUCT Right 1973   duct removed    Medical History: Past Medical History:  Diagnosis Date  . Anxiety   . Depression   . Diabetes mellitus without complication ([place])   . GERD (gastroesophageal reflux disease)   . Hypertension   . IBS (irritable bowel syndrome)   . Neuromuscular disorder ([place])     Family History: Family History  Problem Relation Age of Onset  . Diabetes Mother   . Hypertension Mother   . Coronary artery disease Father   . Glaucoma Father   . Breast cancer Neg Hx     Social History   Socioeconomic History  . Marital status: Married    Spouse name: Not on file  . Number of children: Not on file  . Years of education: Not on file  . Highest education level: Not on file  Occupational History  . Not on file  Social Needs  . Financial resource strain: Not on file  . Food insecurity    Worry: Not on file    Inability: Not on file  . Transportation needs    Medical: Not on file    Non-medical: Not on file  Tobacco Use  . Smoking status: Former Research scientist (life sciences)  . Smokeless tobacco: Never Used  Substance and Sexual Activity  . Alcohol use: No    Alcohol/week: 0.0 standard drinks  . Drug use: Never  . Sexual activity: Not on file  Lifestyle  . Physical activity    Days per week: Not on file    Minutes per session: Not on file  . Stress: Not on file   Relationships  . Social Herbalist on phone: Not on file    Gets together: Not on file    Attends religious service: Not on file    Active member of club or organization: Not on file    Attends meetings of clubs or organizations: Not on file    Relationship status: Not on file  . Intimate partner violence    Fear of current or ex partner: Not on file    Emotionally abused: Not on file    Physically abused: Not on file    Forced sexual activity: Not on file  Other Topics Concern  .  Not on file  Social History Narrative  . Not on file      Review of Systems  Constitutional: Positive for fatigue. Negative for activity change, chills, fever and unexpected weight change.  HENT: Positive for congestion, postnasal drip, rhinorrhea, sinus pressure, sinus pain and sneezing. Negative for sore throat.   Eyes: Positive for redness and itching.  Respiratory: Negative for cough, chest tightness, shortness of breath and wheezing.   Cardiovascular: Negative for chest pain and palpitations.  Endocrine: Negative for cold intolerance, heat intolerance, polydipsia and polyuria.       Blood sugars doing well   Musculoskeletal: Negative for arthralgias, back pain, joint swelling and neck pain.  Skin: Positive for rash.  Allergic/Immunologic: Negative for environmental allergies.  Neurological: Negative for dizziness, tremors, weakness, numbness and headaches.  Hematological: Negative for adenopathy. Does not bruise/bleed easily.  Psychiatric/Behavioral: Positive for dysphoric mood. Negative for behavioral problems (Depression), sleep disturbance and suicidal ideas. The patient is nervous/anxious.     Today's Vitals   01/14/19 1227  Temp: (!) 97.5 F (36.4 C)   There is no height or weight on file to calculate BMI.  Observation/Objective:   The patient is alert and oriented. She is pleasant and answers all questions appropriately. Breathing is non-labored. She is in no acute  distress at this time. The patient is nasally congested and is hoarse. She is tearful throughout the webcam visit.    Assessment/Plan:  1. Acute non-recurrent pansinusitis Start zithromax 250mg  tablets - take two tablets po on first day then take one tablet daily for next nine days. Rest and increase fluids. Continue to use OTC medications as needed and as indicated for acute symptoms.  - azithromycin (ZITHROMAX) 250 MG tablet; Take 2 tablets po on day one. Take 1 tablet po daily from day two through day 10.  Dispense: 11 tablet; Refill: 0  2. Vaginal candidiasis Take diflucan as prescribed if yeast infection develops.  - fluconazole (DIFLUCAN) 150 MG tablet; Take 1  tab  Po once for yeast infection and repeat in 3 days if symptoms persist  Dispense: 3 tablet; Refill: 0  3. GAD (generalized anxiety disorder) recommend she try taking 1/2 tablet of prescribed fluoxetine. Refer to [facility] psych department for further evaluation and treatment . - Ambulatory referral to Psychiatry  General Counseling: annleigh arnstein understanding of the findings of today's phone visit and agrees with plan of treatment. I have discussed any further diagnostic evaluation that may be needed or ordered today. We also reviewed her medications today. she has been encouraged to call the office with any questions or concerns that should arise related to todays visit.  Rest and increase fluids. Continue using OTC medication to control symptoms.   This patient was seen by [name] FNP Collaboration with Dr Lavera Guise as a part of collaborative care agreement  Orders Placed This Encounter  Procedures  . Ambulatory referral to Psychiatry    Meds ordered this encounter  Medications  . azithromycin (ZITHROMAX) 250 MG tablet    Sig: Take 2 tablets po on day one. Take 1 tablet po daily from day two through day 10.    Dispense:  11 tablet    Refill:  0    Order Specific Question:   Supervising Provider    Answer:    Lavera Guise [id number]  . fluconazole (DIFLUCAN) 150 MG tablet    Sig: Take 1  tab  Po once for yeast infection and repeat in 3 days if  symptoms persist    Dispense:  3 tablet    Refill:  0    Order Specific Question:   Supervising Provider    Answer:   Lavera Guise [id number]    Time spent: 107 Minutes    Dr Lavera Guise Internal medicine

## 2019-01-30 ENCOUNTER — Encounter: Payer: Self-pay | Admitting: Psychiatry

## 2019-01-30 ENCOUNTER — Other Ambulatory Visit: Payer: Self-pay

## 2019-01-30 ENCOUNTER — Ambulatory Visit (INDEPENDENT_AMBULATORY_CARE_PROVIDER_SITE_OTHER): Payer: PPO | Admitting: Psychiatry

## 2019-01-30 DIAGNOSIS — F3341 Major depressive disorder, recurrent, in partial remission: Secondary | ICD-10-CM | POA: Insufficient documentation

## 2019-01-30 DIAGNOSIS — Z630 Problems in relationship with spouse or partner: Secondary | ICD-10-CM | POA: Insufficient documentation

## 2019-01-30 DIAGNOSIS — F331 Major depressive disorder, recurrent, moderate: Secondary | ICD-10-CM | POA: Diagnosis not present

## 2019-01-30 DIAGNOSIS — F419 Anxiety disorder, unspecified: Secondary | ICD-10-CM

## 2019-01-30 MED ORDER — ESCITALOPRAM OXALATE 10 MG PO TABS: 10.0000 mg | ORAL_TABLET | Freq: Every day | ORAL | 1 refills | Status: DC

## 2019-01-30 MED ORDER — BUPROPION HCL ER (SR) 100 MG PO TB12: 100.0000 mg | ORAL_TABLET | Freq: Every day | ORAL | 1 refills | Status: DC

## 2019-01-30 NOTE — Progress Notes (Signed)
Psychiatric Initial Adult Assessment  I connected with  Lisa Gutierrez on 01/30/19 by a video enabled telemedicine application and verified that I am speaking with the correct person using two identifiers.   I discussed the limitations of evaluation and management by telemedicine. The patient expressed understanding and agreed to proceed.   Patient Identification: Lisa Gutierrez MRN:  [id number] Date of Evaluation:  01/30/2019   Referral Source: PCP, Ronnell Freshwater, NP  Chief Complaint:   " I am going through a lot."  Visit Diagnosis: MDD (major depressive disorder), recurrent episode, moderate ([place])  Anxiety  Marital relationship problem    History of Present Illness:  68 year old female with hx of depression and GAD now seen after being referred by her PCP for escalating depression symptoms. Pt was recently started on Prozac 10 mg but pt could not tolerate it due to side effects of blurry vision so she discontinued taking it. Pt reported a long hx of marital issues with her spouse of 60 years. She reported that initially things were manageable, however, over the last 13-14 years their relationship has gone downhill. She reported being constantly criticized by her husband. She reported that he says nothing positive to her and is putting her down constantly. She reported that he is fixated on minute details and cleanliness. He wants everything to be done his way and he never appreciates the patient for anything. Pt has tried discussing this with other family members, however, he will behave very well in their presence. She mentioned this to their pastor who said he did not believe anything the patient stated about her husband. Pt stated that she has to walk on eggshells around him and he will do something or the other that will overwhelm her a few times every day. She has frequent crying spells. She has low energy levels, poor sleep and anhedonia. She feels helpless at  times. Pt reported that she takes Xanax 0.5 mg HS for sleep and that helps her fall asleep. She takes Xanax only as needed during the daytime which is not very often. She also spoke about some relationship conflicts with her younger son who she said is just like his father and does not respect her.  She denied any hx of physical abuse by her husband.  Pt denied any symptoms suggestive of hypomania, mania, psychosis or PTSD.  Past meds: Pt reported doing very well on lexapro many years ago but had to stop taking it due to excessive weight gain. She took Bupropion for some time and felt it as not helping much but did reduce her appetite. She was supposed to be taking it twice daily but she stayed on once daily dose of 100 mg. Pt is not certain if prozac caused caused blurring of vision or if it was due to recurring sinus infection.  Pt denied any prior suicide attempts. She denied any active suicidal ideations. She does ask her why she is there on many occasions but does not intend to hurt herself. She believes in god as the ultimate high power and believes that he will give her the strength to overcome this.  Associated Signs/Symptoms: Depression Symptoms:  see HPI (Hypo) Manic Symptoms:  denied Anxiety Symptoms:  Excessive Worry, worries about her husband going off on her Psychotic Symptoms:  denied PTSD Symptoms: Negative  Past Psychiatric History: Depression, anxiety  Previous Psychotropic Medications: Yes   Substance Abuse History in the last 12 months:  No.  Consequences of Substance  Abuse: Negative  Past Medical History:  Past Medical History:  Diagnosis Date  . Anxiety   . Depression   . Diabetes mellitus without complication ([place])   . GERD (gastroesophageal reflux disease)   . Hypertension   . IBS (irritable bowel syndrome)   . Neuromuscular disorder [facility])     Past Surgical History:  Procedure Laterality Date  . CHOLECYSTECTOMY    . EXCISION / BIOPSY BREAST /  NIPPLE / DUCT Right 1973   duct removed    Family Psychiatric History:   Family History:  Family History  Problem Relation Age of Onset  . Diabetes Mother   . Hypertension Mother   . Coronary artery disease Father   . Glaucoma Father   . Breast cancer Neg Hx     Social History:   Social History   Socioeconomic History  . Marital status: Married    Spouse name: Not on file  . Number of children: Not on file  . Years of education: Not on file  . Highest education level: Not on file  Occupational History  . Not on file  Social Needs  . Financial resource strain: Not on file  . Food insecurity    Worry: Not on file    Inability: Not on file  . Transportation needs    Medical: Not on file    Non-medical: Not on file  Tobacco Use  . Smoking status: Former Research scientist (life sciences)  . Smokeless tobacco: Never Used  Substance and Sexual Activity  . Alcohol use: No    Alcohol/week: 0.0 standard drinks  . Drug use: Never  . Sexual activity: Not on file  Lifestyle  . Physical activity    Days per week: Not on file    Minutes per session: Not on file  . Stress: Not on file  Relationships  . Social Herbalist on phone: Not on file    Gets together: Not on file    Attends religious service: Not on file    Active member of club or organization: Not on file    Attends meetings of clubs or organizations: Not on file    Relationship status: Not on file  Other Topics Concern  . Not on file  Social History Narrative  . Not on file    Additional Social History:   Allergies:   Allergies  Allergen Reactions  . Aspirin Other (See Comments)    GI upset  . Escitalopram Other (See Comments)    Weight gain  . Ibuprofen Other (See Comments)    GERD  . Levofloxacin Other (See Comments)    Weight gain  . Meloxicam Other (See Comments)    GI upset  . Morphine Other (See Comments)  . Naprosyn  [Naproxen] Other (See Comments)    GI upset  . Nsaids Other (See Comments)    GI  upset  . Quinolones   . Robinul  [Glycopyrrolate] Nausea And Vomiting  . Penicillin V Potassium Rash  . Sulfa Antibiotics Rash    Metabolic Disorder Labs: Lab Results  Component Value Date   HGBA1C 6.4 (A) 06/19/2018   No results found for: PROLACTIN No results found for: CHOL, TRIG, HDL, CHOLHDL, VLDL, LDLCALC No results found for: TSH  Therapeutic Level Labs: No results found for: LITHIUM No results found for: CBMZ No results found for: VALPROATE  Current Medications: Current Outpatient Medications  Medication Sig Dispense Refill  . acetaminophen (TYLENOL) 500 MG tablet Take 2 tablets by mouth as  needed.    Marland Kitchen acetic acid-hydrocortisone (VOSOL-HC) OTIC solution Place 3 drops into both ears 3 (three) times daily. 10 mL 0  . ALPRAZolam (XANAX) 0.5 MG tablet Take 1 tablet (0.5 mg total) by mouth 2 (two) times daily as needed for anxiety. 60 tablet 2  . azithromycin (ZITHROMAX) 250 MG tablet Take 2 tablets po on day one. Take 1 tablet po daily from day two through day 10. 11 tablet 0  . Calcium Citrate-Vitamin D (CALCIUM + D PO) Take 1 tablet by mouth daily.    . Flaxseed, Linseed, (FLAXSEED OIL PO) Take 1,500 mg by mouth 2 (two) times daily.    . fluconazole (DIFLUCAN) 150 MG tablet Take 1  tab  Po once for yeast infection and repeat in 3 days if symptoms persist 3 tablet 0  . FLUoxetine (PROZAC) 10 MG tablet Take 1 tablet (10 mg total) by mouth daily. (Patient not taking: Reported on 01/14/2019) 30 tablet 3  . fluticasone (FLONASE) 50 MCG/ACT nasal spray Place 2 sprays into both nostrils daily as needed.     Marland Kitchen glucose blood (ACCU-CHEK SMARTVIEW) test strip 1 each by Other route as needed for other. Use as instructed 200 each 11  . LINZESS 145 MCG CAPS capsule Take 1 tab po daily 30 capsule 1  . metoprolol succinate (TOPROL-XL) 25 MG 24 hr tablet Take 2 tablets (50 mg total) by mouth daily. 180 tablet 4  . Multiple Vitamins-Minerals (MULTIVITAMIN ADULT PO) Take 1 tablet by mouth  daily.    Marland Kitchen nystatin (MYCOSTATIN/NYSTOP) powder Apply topically 4 (four) times daily. 60 g 3  . pantoprazole (PROTONIX) 40 MG tablet Take 1 tablet (40 mg total) by mouth 2 (two) times daily. 180 tablet 3  . polyethylene glycol powder (GLYCOLAX/MIRALAX) powder Take by mouth.    . pramoxine-hydrocortisone cream Apply topically 3 (three) times daily. 57 g 0  . sitaGLIPtin-metformin (JANUMET) 50-500 MG tablet Take 1 tablet by mouth 2 (two) times daily with a meal. 90 tablet 4  . Suvorexant (BELSOMRA) 10 MG TABS Take 10 mg by mouth at bedtime as needed. 30 tablet 3  . telmisartan-hydrochlorothiazide (MICARDIS HCT) 80-25 MG tablet Take 1 tablet by mouth daily. 90 tablet 1  . UNABLE TO FIND Estriol 1 mg/g Use 1/4 app per vagina 1-2 times weekly Disp 30 g tube with 2 rf Called to [facility]     No current facility-administered medications for this visit.     Musculoskeletal: Strength & Muscle Tone: Unable to assess due to telemed visit [place]: Unable to assess due to telemed visit Patient leans: Unable to assess due to telemed visit  Psychiatric Specialty Exam: ROS  There were no vitals taken for this visit.There is no height or weight on file to calculate BMI.  General Appearance: Fairly Groomed  Eye Contact:  Good  Speech:  Clear and Coherent and Normal Rate  Volume:  Normal  Mood:  Depressed and Dysphoric  Affect:  Depressed and Tearful  Thought Process:  Goal Directed, Linear and Descriptions of Associations: Intact  Orientation:  Full (Time, Place, and Person)  Thought Content:  Logical  Suicidal Thoughts:  No  Homicidal Thoughts:  No  Memory:  Recent;   Good Remote;   Good  Judgement:  Fair  Insight:  Fair  Psychomotor Activity:  Normal  Concentration:  Concentration: Good and Attention Span: Good  Recall:  Good  Fund of Knowledge:Good  Language: Good  Akathisia:  Negative  Handed:  Right  AIMS (  if indicated):  not done  Assets:  Communication Skills Desire for  Improvement Housing Transportation  ADL's:  Intact  Cognition: WNL  Sleep:  Fair   Screenings: Mini-Mental     Clinical Support from 02/06/2018 in [facility], [facility]  Total Score (max 30 points )  30    PHQ2-9     Office Visit from 12/15/2018 in [facility], [facility] Office Visit from 06/19/2018 in [facility], [facility] Office Visit from 03/25/2018 in [facility], [facility] Nutrition from 01/03/2018 in [place] Office Visit from 12/06/2017 in [facility], [facility]  PHQ-2 Total Score  6  6  0  6  6  PHQ-9 Total Score  24  23  -  25  15      Assessment and Plan:  68 year old female seen via telemed for escalating depressive symptoms due to relationship conflict with her husband. She has tried Wellbutrin with not much effect and has taken lexapro in the past with good effect but had to stop taking it due to weight gain. Pt is agreeable to trying combination of lexapro with wellbutrin to counteract the weight gain issue. Potential side effects of medication and risks vs benefits of treatment vs non-treatment were explained and discussed. All questions were answered.   1. MDD (major depressive disorder), recurrent episode, moderate ([facility])  - restart buPROPion (WELLBUTRIN SR) 100 MG 12 hr tablet; Take 1 tablet (100 mg total) by mouth daily.  Dispense: 30 tablet; Refill: 1 - restart escitalopram (LEXAPRO) 10 MG tablet; Take 1 tablet (10 mg total) by mouth daily.  Dispense: 30 tablet; Refill: 1  2. Anxiety  - escitalopram (LEXAPRO) 10 MG tablet; Take 1 tablet (10 mg total) by mouth daily.  Dispense: 30 tablet; Refill: 1  3. Marital relationship problem - Pt was encouraged to have open communication with her husband. She was explained that unless her relationship improves her symptoms of depression will continue to linger. Pt verbalized her understanding.  F/up in 3 weeks.   [place] Crane, MD 10/16/20209:21 AM

## 2019-02-02 ENCOUNTER — Telehealth: Payer: Self-pay

## 2019-02-02 NOTE — Telephone Encounter (Signed)
pt called ask which one she takes at night the lexapro and wellbutrin?  she also wants to know if she can continue with the xanaX.

## 2019-02-02 NOTE — Telephone Encounter (Signed)
Hi, Yes, I called her earlier and she did not answer.  Can you please call her back and tell her that it is okay for now for her to take Xanax with the combination and we will discuss how she is doing at the time of her next visit and go from there.  Thanks, Dr. Toy Care.

## 2019-02-05 ENCOUNTER — Telehealth: Payer: Self-pay | Admitting: Psychiatry

## 2019-02-05 ENCOUNTER — Inpatient Hospital Stay: Admission: RE | Admit: 2019-02-05 | Payer: PPO | Source: Ambulatory Visit

## 2019-02-05 NOTE — Telephone Encounter (Signed)
I called back the patient spoke with her. She reported feeling more anxious and restless after starting the combination of Lexapro and Wellbutrin. She asked if she could go back to taking Prozac. I advised her to discontinue Lexapro and Wellbutrin and to restart Prozac starting tomorrow. Will keep the scheduled appt for 02/24/2019 to assess how she is doing.

## 2019-02-05 NOTE — Telephone Encounter (Signed)
Patient called to states she is having nervous and anxious feeling, believes it is coming from her medications. Patient would like a call back on this please.

## 2019-02-10 ENCOUNTER — Encounter: Payer: Self-pay | Admitting: Nurse Practitioner

## 2019-02-10 ENCOUNTER — Ambulatory Visit (INDEPENDENT_AMBULATORY_CARE_PROVIDER_SITE_OTHER): Payer: PPO | Admitting: Nurse Practitioner

## 2019-02-10 ENCOUNTER — Other Ambulatory Visit: Payer: Self-pay

## 2019-02-10 VITALS — BP 140/80 | HR 67 | Temp 98.2°F | Resp 16 | Ht 65.0 in | Wt 238.0 lb

## 2019-02-10 DIAGNOSIS — K582 Mixed irritable bowel syndrome: Secondary | ICD-10-CM

## 2019-02-10 DIAGNOSIS — K219 Gastro-esophageal reflux disease without esophagitis: Secondary | ICD-10-CM

## 2019-02-10 DIAGNOSIS — Z0001 Encounter for general adult medical examination with abnormal findings: Secondary | ICD-10-CM | POA: Diagnosis not present

## 2019-02-10 DIAGNOSIS — E1165 Type 2 diabetes mellitus with hyperglycemia: Secondary | ICD-10-CM

## 2019-02-10 DIAGNOSIS — F331 Major depressive disorder, recurrent, moderate: Secondary | ICD-10-CM | POA: Diagnosis not present

## 2019-02-10 DIAGNOSIS — R300 Dysuria: Secondary | ICD-10-CM

## 2019-02-10 LAB — POCT GLYCOSYLATED HEMOGLOBIN (HGB A1C): Hemoglobin A1C: 6.2 % — AB (ref 4.0–5.6)

## 2019-02-10 NOTE — Progress Notes (Signed)
[facility] [place], [place] [address]  Internal MEDICINE  Office Visit Note  Patient Name: Lisa Gutierrez  [id number]  [id number]  Date of Service: 02/25/2019   Pt is here for routine health maintenance examination  Chief Complaint  Patient presents with  . Annual Exam  . Diabetes  . Hypertension  . Gastroesophageal Reflux  . Sinusitis    blurry vision and sneezing     The patient is here for health maintenance exam. She is very depressed and tearful on the phone. She feels as though she has no one to talk to. Feels very alone. With spread of COVID 19, she feels even more isolated than usual. She cries frequently. We have tried multiple antidepressants in the past. Several referral have been made for her to see psychiatry/counseling services. She states that she has just recently, made initial appointment with provider who, she thinks, will be good for her. She is due to have routine, fasting labs done. She still needs to have her mammogram.   Diabetes She presents for her follow-up diabetic visit. She has type 2 diabetes mellitus. Her disease course has been stable. Hypoglycemia symptoms include nervousness/anxiousness. Pertinent negatives for hypoglycemia include no dizziness, headaches or tremors. Associated symptoms include fatigue. Pertinent negatives for diabetes include no chest pain, no polydipsia, no polyuria and no weakness. There are no hypoglycemic complications. Symptoms are stable. There are no diabetic complications. Risk factors for coronary artery disease include diabetes mellitus, dyslipidemia, hypertension, obesity and post-menopausal. Current diabetic treatment includes oral agent (dual therapy). She is compliant with treatment all of the time. Her weight is increasing steadily. She is following a generally healthy diet. Meal planning includes avoidance of concentrated sweets. She has had a previous visit with a dietitian. She  participates in exercise three times a week. There is no change in her home blood glucose trend. An ACE inhibitor/angiotensin II receptor blocker is being taken. She does not see a podiatrist.Eye exam is current.  Depression        This is a chronic problem.  The current episode started 1 to 4 weeks ago.   The onset quality is undetermined.   The problem occurs constantly.  The problem has been gradually worsening since onset.  Associated symptoms include fatigue, hopelessness, restlessness, decreased interest and indigestion.  Associated symptoms include no headaches and no suicidal ideas.     The symptoms are aggravated by family issues and social issues.  Treatments tried: multiple medications tried and caused negative side effects .  Compliance with treatment is variable.  Past compliance problems include medication issues.  Risk factors include stress and marital problems.     Current Medication: Outpatient Encounter Medications as of 02/10/2019  Medication Sig Note  . acetaminophen (TYLENOL) 500 MG tablet Take 2 tablets by mouth as needed. 02/28/2016: Received from: [facility]: 1-2 tablets by mouth single dose as needed for pain  . acetic acid-hydrocortisone (VOSOL-HC) OTIC solution Place 3 drops into both ears 3 (three) times daily.   Marland Kitchen ALPRAZolam (XANAX) 0.5 MG tablet Take 1 tablet (0.5 mg total) by mouth 2 (two) times daily as needed for anxiety.   . Calcium Citrate-Vitamin D (CALCIUM + D PO) Take 1 tablet by mouth daily.   . Flaxseed, Linseed, (FLAXSEED OIL PO) Take 1,500 mg by mouth 2 (two) times daily.   . fluconazole (DIFLUCAN) 150 MG tablet Take 1  tab  Po once for yeast infection and repeat in  3 days if symptoms persist   . fluticasone (FLONASE) 50 MCG/ACT nasal spray Place 2 sprays into both nostrils daily as needed.    Marland Kitchen glucose blood (ACCU-CHEK SMARTVIEW) test strip 1 each by Other route as needed for other. Use as instructed   . metoprolol succinate  (TOPROL-XL) 25 MG 24 hr tablet Take 2 tablets (50 mg total) by mouth daily.   . Multiple Vitamins-Minerals (MULTIVITAMIN ADULT PO) Take 1 tablet by mouth daily.   Marland Kitchen nystatin (MYCOSTATIN/NYSTOP) powder Apply topically 4 (four) times daily.   . pantoprazole (PROTONIX) 40 MG tablet Take 1 tablet (40 mg total) by mouth 2 (two) times daily.   . polyethylene glycol powder (GLYCOLAX/MIRALAX) powder Take by mouth.   . pramoxine-hydrocortisone cream Apply topically 3 (three) times daily.   . sitaGLIPtin-metformin (JANUMET) 50-500 MG tablet Take 1 tablet by mouth 2 (two) times daily with a meal.   . Suvorexant (BELSOMRA) 10 MG TABS Take 10 mg by mouth at bedtime as needed.   Marland Kitchen telmisartan-hydrochlorothiazide (MICARDIS HCT) 80-25 MG tablet Take 1 tablet by mouth daily.   Marland Kitchen UNABLE TO FIND Estriol 1 mg/g Use 1/4 app per vagina 1-2 times weekly Disp 30 g tube with 2 rf [facility] 01/03/2018: compound medication for vaginal use  . [DISCONTINUED] buPROPion (WELLBUTRIN SR) 100 MG 12 hr tablet Take 1 tablet (100 mg total) by mouth daily.   . [DISCONTINUED] escitalopram (LEXAPRO) 10 MG tablet Take 1 tablet (10 mg total) by mouth daily.   . [DISCONTINUED] LINZESS 145 MCG CAPS capsule Take 1 tab po daily   . [DISCONTINUED] azithromycin (ZITHROMAX) 250 MG tablet Take 2 tablets po on day one. Take 1 tablet po daily from day two through day 10. (Patient not taking: Reported on 02/10/2019)    No facility-administered encounter medications on file as of 02/10/2019.     Surgical History: Past Surgical History:  Procedure Laterality Date  . BREAST EXCISIONAL BIOPSY    . CHOLECYSTECTOMY    . EXCISION / BIOPSY BREAST / NIPPLE / DUCT Right 1973   duct removed    Medical History: Past Medical History:  Diagnosis Date  . Anxiety   . Depression   . Diabetes mellitus without complication ([place])   . GERD (gastroesophageal reflux disease)   . Hypertension   . IBS (irritable bowel syndrome)   . Neuromuscular  disorder ([place])     Family History: Family History  Problem Relation Age of Onset  . Diabetes Mother   . Hypertension Mother   . Coronary artery disease Father   . Glaucoma Father   . Breast cancer Neg Hx       Review of Systems  Constitutional: Positive for fatigue. Negative for activity change, chills, fever and unexpected weight change.  HENT: Positive for congestion, postnasal drip and rhinorrhea. Negative for sinus pressure, sinus pain, sneezing and sore throat.   Eyes: Positive for redness and itching.  Respiratory: Negative for cough, chest tightness, shortness of breath and wheezing.   Cardiovascular: Negative for chest pain and palpitations.  Gastrointestinal: Positive for constipation, diarrhea and nausea. Negative for vomiting.  Endocrine: Negative for cold intolerance, heat intolerance, polydipsia and polyuria.       Blood sugars doing well   Genitourinary: Negative for dysuria, frequency and pelvic pain.  Musculoskeletal: Negative for arthralgias, back pain, joint swelling and neck pain.  Skin: Positive for rash.  Allergic/Immunologic: Positive for environmental allergies.  Neurological: Negative for dizziness, tremors, weakness, numbness and headaches.  Hematological: Negative  for adenopathy. Does not bruise/bleed easily.  Psychiatric/Behavioral: Positive for depression and dysphoric mood. Negative for behavioral problems (Depression), sleep disturbance and suicidal ideas. The patient is nervous/anxious.     Today's Vitals   02/10/19 1003  BP: 140/80  Pulse: 67  Resp: 16  Temp: 98.2 F (36.8 C)  SpO2: 98%  Weight: 238 lb (108 kg)  Height: 5\' 5"  (1.651 m)   Body mass index is 39.61 kg/m.  Physical Exam Vitals signs and nursing note reviewed.  Constitutional:      General: She is not in acute distress.    Appearance: Normal appearance. She is well-developed. She is obese. She is not diaphoretic.  HENT:     Head: Normocephalic and atraumatic.      Mouth/Throat:     Pharynx: No oropharyngeal exudate.  Eyes:     Extraocular Movements: Extraocular movements intact.     Pupils: Pupils are equal, round, and reactive to light.  Neck:     Musculoskeletal: Normal range of motion and neck supple.     Thyroid: No thyromegaly.     Vascular: No carotid bruit or JVD.     Trachea: No tracheal deviation.  Cardiovascular:     Rate and Rhythm: Normal rate and regular rhythm.     Pulses: Normal pulses.          Dorsalis pedis pulses are 2+ on the right side and 2+ on the left side.       Posterior tibial pulses are 2+ on the right side and 2+ on the left side.     Heart sounds: Normal heart sounds. No murmur. No friction rub. No gallop.   Pulmonary:     Effort: Pulmonary effort is normal. No respiratory distress.     Breath sounds: Normal breath sounds. No wheezing or rales.  Chest:     Chest wall: No tenderness.  Abdominal:     General: Bowel sounds are normal.     Palpations: Abdomen is soft.     Tenderness: There is no abdominal tenderness.  Musculoskeletal: Normal range of motion.     Right foot: Normal range of motion. No deformity.     Left foot: Normal range of motion. No deformity.  Feet:     Right foot:     Protective Sensation: 10 sites tested. 10 sites sensed.     Skin integrity: Skin integrity normal.     Toenail Condition: Right toenails are normal.     Left foot:     Protective Sensation: 10 sites tested. 10 sites sensed.     Skin integrity: Skin integrity normal.     Toenail Condition: Left toenails are normal.  Lymphadenopathy:     Cervical: No cervical adenopathy.  Skin:    General: Skin is warm and dry.  Neurological:     Mental Status: She is alert and oriented to person, place, and time.     Cranial Nerves: No cranial nerve deficit.  Psychiatric:        Attention and Perception: Attention and perception normal.        Mood and Affect: Mood is anxious. Affect is tearful.        Speech: Speech normal.         Behavior: Behavior normal. Behavior is cooperative.        Thought Content: Thought content normal.        Cognition and Memory: Cognition and memory normal.        Judgment: Judgment normal.  Depression screen [facility] 2/9 02/10/2019 12/15/2018 06/19/2018 03/25/2018 01/03/2018  Decreased Interest 3 3 3  0 3  Down, Depressed, Hopeless 3 3 3  0 3  PHQ - 2 Score 6 6 6  0 6  Altered sleeping 3 3 3  - 3  Tired, decreased energy 3 3 3  - 3  Change in appetite 3 3 2  - 3  Feeling bad or failure about yourself  3 3 3  - 3  Trouble concentrating 3 3 3  - 3  Moving slowly or fidgety/restless 3 3 3  - 3  Suicidal thoughts 0 0 0 - 1  PHQ-9 Score 24 24 23  - 25  Difficult doing work/chores Very difficult Very difficult - - Very difficult    Functional Status Survey: Is the patient deaf or have difficulty hearing?: No Does the patient have difficulty seeing, even when wearing glasses/contacts?: No Does the patient have difficulty concentrating, remembering, or making decisions?: No Does the patient have difficulty walking or climbing stairs?: No Does the patient have difficulty dressing or bathing?: No Does the patient have difficulty doing errands alone such as visiting a doctor's office or shopping?: No  MMSE - [place] Exam 02/06/2018  Orientation to time 5  Orientation to Place 5  Registration 3  Attention/ Calculation 5  Recall 3  Language- name 2 objects 2  Language- repeat 1  Language- follow 3 step command 3  Language- read & follow direction 1  Write a sentence 1  Copy design 1  Total score 30    Fall Risk  02/10/2019 12/15/2018 06/19/2018 03/25/2018 02/06/2018  Falls in the past year? 0 0 0 0 No  Number falls in past yr: - - - 0 -  Injury with Fall? - - - 0 -  Comment - - - - -  Risk for fall due to : - - - - -      LABS: Recent Results (from the past 2160 hour(s))  UA/M w/rflx Culture, Routine     Status: None   Collection Time: 02/10/19 10:01 AM   Specimen: Urine    URINE  Result Value Ref Range   Specific Gravity, UA CANCELED     Comment: Test not performed. Insufficient specimen to perform or complete analysis.  Result canceled by the ancillary.    pH, UA CANCELED     Comment: Test not performed  Result canceled by the ancillary.    Protein,UA CANCELED     Comment: Test not performed  Result canceled by the ancillary.    Glucose, UA CANCELED     Comment: Test not performed  Result canceled by the ancillary.    Ketones, UA CANCELED     Comment: Test not performed  Result canceled by the ancillary.   POCT HgB A1C     Status: Abnormal   Collection Time: 02/10/19 10:23 AM  Result Value Ref Range   Hemoglobin A1C 6.2 (A) 4.0 - 5.6 %   HbA1c POC (<> result, manual entry)     HbA1c, POC (prediabetic range)     HbA1c, POC (controlled diabetic range)     Assessment/Plan: 1. Encounter for general adult medical examination with abnormal findings Annual health maintenance exam today.  2. Type 2 diabetes mellitus with hyperglycemia, without long-term current use of insulin (HCC) - POCT HgB A1C  6.2 today. Continue diabetic medication as prescribed   3. Gastroesophageal reflux disease without esophagitis Continue pantoprazole as prescribed   4. Irritable bowel syndrome with both constipation and diarrhea Refer to  GI for further evaluation and treatment . - Ambulatory referral to Gastroenterology  5. MDD (major depressive disorder), recurrent episode, moderate ([place]) Patient now seeing psychiatry on regular basis. Doing better.   6. Dysuria - UA/M w/rflx Culture, Routine  General Counseling: Lisa Gutierrez verbalizes understanding of the findings of todays visit and agrees with plan of treatment. I have discussed any further diagnostic evaluation that may be needed or ordered today. We also reviewed her medications today. she has been encouraged to call the office with any questions or concerns that should arise related to todays  visit.    Counseling:  This patient was seen by [name] FNP Collaboration with Dr Lavera Guise as a part of collaborative care agreement  Orders Placed This Encounter  Procedures  . UA/M w/rflx Culture, Routine  . Ambulatory referral to Gastroenterology  . POCT HgB A1C     Time spent: [place], MD  Internal Medicine

## 2019-02-12 LAB — UA/M W/RFLX CULTURE, ROUTINE

## 2019-02-13 ENCOUNTER — Encounter: Payer: Self-pay | Admitting: *Deleted

## 2019-02-23 ENCOUNTER — Other Ambulatory Visit: Payer: Self-pay | Admitting: Nurse Practitioner

## 2019-02-23 MED ORDER — LINZESS 145 MCG PO CAPS: ORAL_CAPSULE | ORAL | 1 refills | Status: DC

## 2019-02-24 ENCOUNTER — Ambulatory Visit (INDEPENDENT_AMBULATORY_CARE_PROVIDER_SITE_OTHER): Payer: PPO | Admitting: Psychiatry

## 2019-02-24 ENCOUNTER — Encounter: Payer: Self-pay | Admitting: Psychiatry

## 2019-02-24 ENCOUNTER — Other Ambulatory Visit: Payer: Self-pay

## 2019-02-24 ENCOUNTER — Ambulatory Visit
Admission: RE | Admit: 2019-02-24 | Discharge: 2019-02-24 | Disposition: A | Discharge status: Home / Self Care | Payer: PPO | Source: Ambulatory Visit | Attending: Nurse Practitioner | Admitting: Nurse Practitioner

## 2019-02-24 DIAGNOSIS — F331 Major depressive disorder, recurrent, moderate: Secondary | ICD-10-CM | POA: Diagnosis not present

## 2019-02-24 DIAGNOSIS — Z1231 Encounter for screening mammogram for malignant neoplasm of breast: Secondary | ICD-10-CM | POA: Insufficient documentation

## 2019-02-24 DIAGNOSIS — F419 Anxiety disorder, unspecified: Secondary | ICD-10-CM | POA: Diagnosis not present

## 2019-02-24 DIAGNOSIS — Z630 Problems in relationship with spouse or partner: Secondary | ICD-10-CM

## 2019-02-24 MED ORDER — FLUOXETINE HCL 10 MG PO CAPS: 10.0000 mg | ORAL_CAPSULE | Freq: Every day | ORAL | 1 refills | Status: DC
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
## 2019-02-25 DIAGNOSIS — K581 Irritable bowel syndrome with constipation: Secondary | ICD-10-CM | POA: Insufficient documentation

## 2019-02-25 DIAGNOSIS — K582 Mixed irritable bowel syndrome: Secondary | ICD-10-CM | POA: Insufficient documentation

## 2019-02-25 NOTE — Progress Notes (Signed)
Negative mammogram

## 2019-02-26 DIAGNOSIS — J309 Allergic rhinitis, unspecified: Secondary | ICD-10-CM | POA: Diagnosis not present

## 2019-03-02 ENCOUNTER — Other Ambulatory Visit: Payer: Self-pay

## 2019-03-02 DIAGNOSIS — I10 Essential (primary) hypertension: Secondary | ICD-10-CM

## 2019-03-02 MED ORDER — TELMISARTAN-HCTZ 80-25 MG PO TABS: 1.0000 | ORAL_TABLET | Freq: Every day | ORAL | 1 refills | Status: DC

## 2019-03-03 DIAGNOSIS — J301 Allergic rhinitis due to pollen: Secondary | ICD-10-CM | POA: Diagnosis not present

## 2019-03-10 ENCOUNTER — Other Ambulatory Visit: Payer: Self-pay

## 2019-03-10 ENCOUNTER — Other Ambulatory Visit: Payer: Self-pay | Admitting: Unknown Physician Specialty

## 2019-03-10 ENCOUNTER — Ambulatory Visit
Admission: RE | Admit: 2019-03-10 | Discharge: 2019-03-10 | Disposition: A | Discharge status: Home / Self Care | Payer: PPO | Source: Ambulatory Visit | Attending: Unknown Physician Specialty | Admitting: Unknown Physician Specialty

## 2019-03-10 DIAGNOSIS — J329 Chronic sinusitis, unspecified: Secondary | ICD-10-CM | POA: Insufficient documentation

## 2019-03-10 DIAGNOSIS — J3489 Other specified disorders of nose and nasal sinuses: Secondary | ICD-10-CM | POA: Diagnosis not present

## 2019-03-10 DIAGNOSIS — R0981 Nasal congestion: Secondary | ICD-10-CM | POA: Diagnosis not present

## 2019-03-11 ENCOUNTER — Telehealth: Payer: Self-pay

## 2019-03-11 NOTE — Telephone Encounter (Signed)
LMOM TO CONFIRM AND SCREEN FOR COVID FOR 03-17-19 OV.

## 2019-03-17 ENCOUNTER — Ambulatory Visit: Payer: PPO | Admitting: Nurse Practitioner

## 2019-03-17 ENCOUNTER — Other Ambulatory Visit: Payer: Self-pay | Admitting: Nurse Practitioner

## 2019-03-17 ENCOUNTER — Ambulatory Visit (INDEPENDENT_AMBULATORY_CARE_PROVIDER_SITE_OTHER): Payer: PPO | Admitting: Nurse Practitioner

## 2019-03-17 DIAGNOSIS — J309 Allergic rhinitis, unspecified: Secondary | ICD-10-CM | POA: Diagnosis not present

## 2019-03-17 DIAGNOSIS — J0141 Acute recurrent pansinusitis: Secondary | ICD-10-CM

## 2019-03-17 DIAGNOSIS — E1165 Type 2 diabetes mellitus with hyperglycemia: Secondary | ICD-10-CM

## 2019-03-17 MED ORDER — AZITHROMYCIN 250 MG PO TABS: ORAL_TABLET | ORAL | 0 refills | Status: DC

## 2019-03-17 MED ORDER — CLARITHROMYCIN 500 MG PO TABS: 500.0000 mg | ORAL_TABLET | Freq: Two times a day (BID) | ORAL | 0 refills | Status: DC

## 2019-03-17 NOTE — Progress Notes (Signed)
D.c biaxin. Start zithromax 250mg  - take t tablets on first day, then take one tablet daily from day 3 through 10.

## 2019-03-17 NOTE — Progress Notes (Signed)
Sent prescription to biaxin 500mg  bid for 10 days for sinusitis to [facility].

## 2019-03-18 ENCOUNTER — Encounter: Payer: Self-pay | Admitting: Nurse Practitioner

## 2019-03-18 ENCOUNTER — Other Ambulatory Visit: Payer: Self-pay

## 2019-03-18 DIAGNOSIS — J0141 Acute recurrent pansinusitis: Secondary | ICD-10-CM | POA: Insufficient documentation

## 2019-03-18 DIAGNOSIS — J309 Allergic rhinitis, unspecified: Secondary | ICD-10-CM | POA: Insufficient documentation

## 2019-03-18 NOTE — Progress Notes (Signed)
[facility] [place], [place] [address]  Internal MEDICINE  Telephone Visit  Patient Name: Lisa Gutierrez  [id number]  [id number]  Date of Service: 03/18/2019  I connected with the patient at 2:00pm by telephone and verified the patients identity using two identifiers.   I discussed the limitations, risks, security and privacy concerns of performing an evaluation and management service by telephone and the availability of in person appointments. I also discussed with the patient that there may be a patient responsible charge related to the service.  The patient expressed understanding and agrees to proceed.    Chief Complaint  Patient presents with  . Telephone Assessment  . Telephone Screen  . Sinusitis  . Nasal Congestion  . Cough    The patient has been contacted via telephone for follow up visit due to concerns for spread of novel coronavirus. The patient is c/o severe congestion. This ha been going off and on for several months. She has been treated with antibiotics. When symptoms returned, she saw ENT provider. She did have lab work done for allergens. She was told it was positive for ragweed. ENT did x-ray of her sinuses which showed a mildly deviated septum. There were otherwise no abnormalities. She currently has moderate nasal congestion and post nasal drip. She has headache and cough. She denies fever, nausea, or vomiting.       Current Medication: Outpatient Encounter Medications as of 03/17/2019  Medication Sig Note  . acetaminophen (TYLENOL) 500 MG tablet Take 2 tablets by mouth as needed. 02/28/2016: Received from: [facility]: 1-2 tablets by mouth single dose as needed for pain  . acetic acid-hydrocortisone (VOSOL-HC) OTIC solution Place 3 drops into both ears 3 (three) times daily.   Marland Kitchen ALPRAZolam (XANAX) 0.5 MG tablet Take 1 tablet (0.5 mg total) by mouth 2 (two) times daily as needed for anxiety.   Marland Kitchen  azithromycin (ZITHROMAX) 250 MG tablet Take 2 tablets po on day one, then take  One tablet po on days two through ten   . Calcium Citrate-Vitamin D (CALCIUM + D PO) Take 1 tablet by mouth daily.   . clarithromycin (BIAXIN) 500 MG tablet Take 1 tablet (500 mg total) by mouth 2 (two) times daily.   . Flaxseed, Linseed, (FLAXSEED OIL PO) Take 1,500 mg by mouth 2 (two) times daily.   . fluconazole (DIFLUCAN) 150 MG tablet Take 1  tab  Po once for yeast infection and repeat in 3 days if symptoms persist   . FLUoxetine (PROZAC) 10 MG capsule Take 1 capsule (10 mg total) by mouth daily.   . fluticasone (FLONASE) 50 MCG/ACT nasal spray Place 2 sprays into both nostrils daily as needed.    Marland Kitchen glucose blood (ACCU-CHEK SMARTVIEW) test strip 1 each by Other route as needed for other. Use as instructed   . LINZESS 145 MCG CAPS capsule Take 1 tab po daily   . metoprolol succinate (TOPROL-XL) 25 MG 24 hr tablet Take 2 tablets (50 mg total) by mouth daily.   . Multiple Vitamins-Minerals (MULTIVITAMIN ADULT PO) Take 1 tablet by mouth daily.   Marland Kitchen nystatin (MYCOSTATIN/NYSTOP) powder Apply topically 4 (four) times daily.   . pantoprazole (PROTONIX) 40 MG tablet Take 1 tablet (40 mg total) by mouth 2 (two) times daily.   . polyethylene glycol powder (GLYCOLAX/MIRALAX) powder Take by mouth.   . pramoxine-hydrocortisone cream Apply topically 3 (three) times daily.   . sitaGLIPtin-metformin (JANUMET) 50-500 MG tablet Take  1 tablet by mouth 2 (two) times daily with a meal.   . Suvorexant (BELSOMRA) 10 MG TABS Take 10 mg by mouth at bedtime as needed.   Marland Kitchen telmisartan-hydrochlorothiazide (MICARDIS HCT) 80-25 MG tablet Take 1 tablet by mouth daily.   Marland Kitchen UNABLE TO FIND Estriol 1 mg/g Use 1/4 app per vagina 1-2 times weekly Disp 30 g tube with 2 rf [facility] 01/03/2018: compound medication for vaginal use   No facility-administered encounter medications on file as of 03/17/2019.     Surgical History: Past Surgical  History:  Procedure Laterality Date  . BREAST EXCISIONAL BIOPSY    . CHOLECYSTECTOMY    . EXCISION / BIOPSY BREAST / NIPPLE / DUCT Right 1973   duct removed    Medical History: Past Medical History:  Diagnosis Date  . Anxiety   . Depression   . Diabetes mellitus without complication ([place])   . GERD (gastroesophageal reflux disease)   . Hypertension   . IBS (irritable bowel syndrome)   . Neuromuscular disorder ([place])     Family History: Family History  Problem Relation Age of Onset  . Diabetes Mother   . Hypertension Mother   . Coronary artery disease Father   . Glaucoma Father   . Breast cancer Neg Hx     Social History   Socioeconomic History  . Marital status: Married    Spouse name: Not on file  . Number of children: Not on file  . Years of education: Not on file  . Highest education level: Not on file  Occupational History  . Not on file  Social Needs  . Financial resource strain: Not on file  . Food insecurity    Worry: Not on file    Inability: Not on file  . Transportation needs    Medical: Not on file    Non-medical: Not on file  Tobacco Use  . Smoking status: Former Research scientist (life sciences)  . Smokeless tobacco: Never Used  Substance and Sexual Activity  . Alcohol use: No    Alcohol/week: 0.0 standard drinks  . Drug use: Never  . Sexual activity: Not on file  Lifestyle  . Physical activity    Days per week: Not on file    Minutes per session: Not on file  . Stress: Not on file  Relationships  . Social Herbalist on phone: Not on file    Gets together: Not on file    Attends religious service: Not on file    Active member of club or organization: Not on file    Attends meetings of clubs or organizations: Not on file    Relationship status: Not on file  . Intimate partner violence    Fear of current or ex partner: Not on file    Emotionally abused: Not on file    Physically abused: Not on file    Forced sexual activity: Not on file  Other Topics  Concern  . Not on file  Social History Narrative  . Not on file      Review of Systems  Constitutional: Positive for fatigue. Negative for activity change, chills, fever and unexpected weight change.  HENT: Positive for congestion, postnasal drip, rhinorrhea, sinus pressure and sinus pain. Negative for sneezing and sore throat.   Eyes: Positive for redness and itching.  Respiratory: Positive for cough. Negative for chest tightness, shortness of breath and wheezing.   Cardiovascular: Negative for chest pain and palpitations.  Gastrointestinal: Negative for constipation,  diarrhea, nausea and vomiting.  Endocrine: Negative for cold intolerance, heat intolerance, polydipsia and polyuria.       Blood sugars doing well   Musculoskeletal: Negative for arthralgias, back pain, joint swelling and neck pain.  Skin: Negative for rash.  Allergic/Immunologic: Positive for environmental allergies.  Neurological: Positive for headaches. Negative for dizziness, tremors, weakness and numbness.  Hematological: Negative for adenopathy. Does not bruise/bleed easily.  Psychiatric/Behavioral: Positive for dysphoric mood. Negative for behavioral problems (Depression), sleep disturbance and suicidal ideas. The patient is nervous/anxious.     Vital Signs: There were no vitals taken for this visit.   Observation/Objective:   The patient is alert and oriented. She is pleasant and answers all questions appropriately. Breathing is non-labored. She is in no acute distress at this time. The patient has moderate nasal congestion. She has congested, non-productive cough.    Assessment/Plan: 1. Acute recurrent pansinusitis zithromax 250mg  tablets. Take two tablets on day one then take one tablet daily on days two through ten. Rest and increase fluids. Will consider medrol taper if no improvement over next few days.   2. Chronic allergic rhinitis Continue allergy treatments as prescribed .  3. Type 2 diabetes  mellitus with hyperglycemia, without long-term current use of insulin ([place]) Continue all diabetic medication as prescribed   General Counseling: Shavell verbalizes understanding of the findings of today's phone visit and agrees with plan of treatment. I have discussed any further diagnostic evaluation that may be needed or ordered today. We also reviewed her medications today. she has been encouraged to call the office with any questions or concerns that should arise related to todays visit.   This patient was seen by [name] FNP Collaboration with Dr Lavera Guise as a part of collaborative care agreement  Time spent: 25 Minutes    Dr Lavera Guise Internal medicine

## 2019-03-19 ENCOUNTER — Other Ambulatory Visit: Payer: Self-pay

## 2019-03-19 ENCOUNTER — Encounter: Payer: Self-pay | Admitting: Psychiatry

## 2019-03-19 ENCOUNTER — Ambulatory Visit (INDEPENDENT_AMBULATORY_CARE_PROVIDER_SITE_OTHER): Payer: PPO | Admitting: Psychiatry

## 2019-03-19 DIAGNOSIS — Z630 Problems in relationship with spouse or partner: Secondary | ICD-10-CM | POA: Diagnosis not present

## 2019-03-19 DIAGNOSIS — F419 Anxiety disorder, unspecified: Secondary | ICD-10-CM

## 2019-03-19 DIAGNOSIS — F331 Major depressive disorder, recurrent, moderate: Secondary | ICD-10-CM | POA: Diagnosis not present

## 2019-03-19 MED ORDER — VILAZODONE HCL 20 MG PO TABS: 20.0000 mg | ORAL_TABLET | Freq: Every day | ORAL | 1 refills | Status: DC

## 2019-03-19 NOTE — Progress Notes (Signed)
[facility] MD OP Progress Note  I connected with  Lisa Gutierrez on 03/19/19 by phone and verified that I am speaking with the correct person using two identifiers.   I discussed the limitations of evaluation and management by phone. The patient expressed understanding and agreed to proceed.   03/19/2019 11:00 AM Lisa Gutierrez  MRN:  [id number]  Chief Complaint:  " I have been having nightmares with Prozac."  HPI: Pt reported that she wonders if there is something wrong with her as she has tried several antidepressant and all of them seem to cause side effects. She has taken Prozac for a few weeks now and she feels it is making her feel more irritable and angry.  She has also noted increased nightmares after she has been taking it. She spoke about her multiple health ailments and how they have impacted her this whole year.  She is not sure when she will feel better.  She continues to be in a conflictual relationship with her husband.  Her relationship with her son still is poor. She informed that her son got a new dog and her son has not been treating it right which makes her feel upset. She can not see any animal getting hurt or suffer. She asked if she should try a different medication or just keep taking the Prozac. She denied any suicidal ideations or plans.  Visit Diagnosis:    ICD-10-CM   1. MDD (major depressive disorder), recurrent episode, moderate (HCC)  F33.1   2. Anxiety  F41.9   3. Marital relationship problem  Z63.0     Past Psychiatric History: depression, anxiety  Past Medical History:  Past Medical History:  Diagnosis Date  . Anxiety   . Depression   . Diabetes mellitus without complication ([place])   . GERD (gastroesophageal reflux disease)   . Hypertension   . IBS (irritable bowel syndrome)   . Neuromuscular disorder [facility])     Past Surgical History:  Procedure Laterality Date  . BREAST EXCISIONAL BIOPSY    . CHOLECYSTECTOMY    . EXCISION / BIOPSY  BREAST / NIPPLE / DUCT Right 1973   duct removed    Family Psychiatric History: denied  Family History:  Family History  Problem Relation Age of Onset  . Diabetes Mother   . Hypertension Mother   . Coronary artery disease Father   . Glaucoma Father   . Breast cancer Neg Hx     Social History:  Social History   Socioeconomic History  . Marital status: Married    Spouse name: Not on file  . Number of children: Not on file  . Years of education: Not on file  . Highest education level: Not on file  Occupational History  . Not on file  Social Needs  . Financial resource strain: Not on file  . Food insecurity    Worry: Not on file    Inability: Not on file  . Transportation needs    Medical: Not on file    Non-medical: Not on file  Tobacco Use  . Smoking status: Former Research scientist (life sciences)  . Smokeless tobacco: Never Used  Substance and Sexual Activity  . Alcohol use: No    Alcohol/week: 0.0 standard drinks  . Drug use: Never  . Sexual activity: Not on file  Lifestyle  . Physical activity    Days per week: Not on file    Minutes per session: Not on file  . Stress: Not on file  Relationships  .  Social Herbalist on phone: Not on file    Gets together: Not on file    Attends religious service: Not on file    Active member of club or organization: Not on file    Attends meetings of clubs or organizations: Not on file    Relationship status: Not on file  Other Topics Concern  . Not on file  Social History Narrative  . Not on file    Allergies:  Allergies  Allergen Reactions  . Aspirin Other (See Comments)    GI upset  . Ibuprofen Other (See Comments)    GERD  . Levofloxacin Other (See Comments)    Weight gain  . Meloxicam Other (See Comments)    GI upset  . Morphine Other (See Comments)  . Naprosyn  [Naproxen] Other (See Comments)    GI upset  . Nsaids Other (See Comments)    GI upset  . Quinolones   . Robinul  [Glycopyrrolate] Nausea And Vomiting  .  Penicillin V Potassium Rash  . Sulfa Antibiotics Rash    Metabolic Disorder Labs: Lab Results  Component Value Date   HGBA1C 6.2 (A) 02/10/2019   No results found for: PROLACTIN No results found for: CHOL, TRIG, HDL, CHOLHDL, VLDL, LDLCALC No results found for: TSH  Therapeutic Level Labs: No results found for: LITHIUM No results found for: VALPROATE No components found for:  CBMZ  Current Medications: Current Outpatient Medications  Medication Sig Dispense Refill  . acetaminophen (TYLENOL) 500 MG tablet Take 2 tablets by mouth as needed.    Marland Kitchen acetic acid-hydrocortisone (VOSOL-HC) OTIC solution Place 3 drops into both ears 3 (three) times daily. 10 mL 0  . ALPRAZolam (XANAX) 0.5 MG tablet Take 1 tablet (0.5 mg total) by mouth 2 (two) times daily as needed for anxiety. 60 tablet 2  . azithromycin (ZITHROMAX) 250 MG tablet Take 2 tablets po on day one, then take  One tablet po on days two through ten 11 tablet 0  . Calcium Citrate-Vitamin D (CALCIUM + D PO) Take 1 tablet by mouth daily.    . Flaxseed, Linseed, (FLAXSEED OIL PO) Take 1,500 mg by mouth 2 (two) times daily.    . fluconazole (DIFLUCAN) 150 MG tablet Take 1  tab  Po once for yeast infection and repeat in 3 days if symptoms persist 3 tablet 0  . FLUoxetine (PROZAC) 10 MG capsule Take 1 capsule (10 mg total) by mouth daily. 30 capsule 1  . fluticasone (FLONASE) 50 MCG/ACT nasal spray Place 2 sprays into both nostrils daily as needed.     Marland Kitchen glucose blood (ACCU-CHEK SMARTVIEW) test strip 1 each by Other route as needed for other. Use as instructed 200 each 11  . LINZESS 145 MCG CAPS capsule Take 1 tab po daily 30 capsule 1  . metoprolol succinate (TOPROL-XL) 25 MG 24 hr tablet Take 2 tablets (50 mg total) by mouth daily. 180 tablet 4  . Multiple Vitamins-Minerals (MULTIVITAMIN ADULT PO) Take 1 tablet by mouth daily.    Marland Kitchen nystatin (MYCOSTATIN/NYSTOP) powder Apply topically 4 (four) times daily. 60 g 3  . pantoprazole  (PROTONIX) 40 MG tablet Take 1 tablet (40 mg total) by mouth 2 (two) times daily. 180 tablet 3  . polyethylene glycol powder (GLYCOLAX/MIRALAX) powder Take by mouth.    . pramoxine-hydrocortisone cream Apply topically 3 (three) times daily. 57 g 0  . sitaGLIPtin-metformin (JANUMET) 50-500 MG tablet Take 1 tablet by mouth 2 (two) times daily  with a meal. 90 tablet 4  . Suvorexant (BELSOMRA) 10 MG TABS Take 10 mg by mouth at bedtime as needed. 30 tablet 3  . telmisartan-hydrochlorothiazide (MICARDIS HCT) 80-25 MG tablet Take 1 tablet by mouth daily. 90 tablet 1  . UNABLE TO FIND Estriol 1 mg/g Use 1/4 app per vagina 1-2 times weekly Disp 30 g tube with 2 rf Called [facility]     No current facility-administered medications for this visit.      Musculoskeletal: Strength & Muscle Tone: unable to assess due to telemed visit Gait & Station: unable to assess due to telemed visit Patient leans: unable to assess due to telemed visit  Psychiatric Specialty Exam: ROS  There were no vitals taken for this visit.There is no height or weight on file to calculate BMI.  General Appearance: unable to assess due to phone visit  Eye Contact:  unable to assess due to phone visit  Speech:  Clear and Coherent and Normal Rate  Volume:  Normal  Mood:  Depressed  Affect:  Congruent  Thought Process:  Goal Directed, Linear and Descriptions of Associations: Intact  Orientation:  Full (Time, Place, and Person)  Thought Content: Logical and Rumination   Suicidal Thoughts:  No  Homicidal Thoughts:  No  Memory:  Recent;   Good Remote;   Good  Judgement:  Fair  Insight:  Fair  Psychomotor Activity:  Normal  Concentration:  Concentration: Good and Attention Span: Good  Recall:  Good  Fund of Knowledge: Good  Language: Good  Akathisia:  Negative  Handed:  Right  AIMS (if indicated): not done  Assets:  Communication Skills Desire for Improvement Financial Resources/Insurance Housing  ADL's:  Intact   Cognition: WNL  Sleep:  Fair   Screenings: Mini-Mental     Clinical Support from 02/06/2018 in [facility], [facility]  Total Score (max 30 points )  30    PHQ2-9     Office Visit from 03/17/2019 in [facility], [place] from 02/10/2019 in [facility], [facility] Office Visit from 12/15/2018 in [facility], [facility] Office Visit from 06/19/2018 in [place], [facility] Office Visit from 03/25/2018 in [facility]  PHQ-2 Total Score  0  6  6  6   0  PHQ-9 Total Score  0  24  24  23   -       Assessment and Plan: Patient continues to have depressive symptoms and has found Prozac not to be helpful.  She was offered Viibryd to target her depressive and anxiety symptoms. Potential side effects of medication and risks vs benefits of treatment vs non-treatment were explained and discussed. All questions were answered.  1. MDD (major depressive disorder), recurrent episode, moderate (HCC) - Discontinue prozac. - Start Vilazodone HCl 20 MG TABS; Take 1 tablet (20 mg total) by mouth daily.  Dispense: 30 tablet; Refill: 1  2. Anxiety  - Vilazodone HCl 20 MG TABS; Take 1 tablet (20 mg total) by mouth daily.  Dispense: 30 tablet; Refill: 1  3. Marital relationship problem   F/up in 1 month.   [place] Crane, MD 03/19/2019, 11:00 AM

## 2019-03-26 ENCOUNTER — Telehealth: Payer: Self-pay | Admitting: Psychiatry

## 2019-03-26 MED ORDER — VENLAFAXINE HCL ER 37.5 MG PO CP24: 37.5000 mg | ORAL_CAPSULE | Freq: Every day | ORAL | 0 refills | Status: DC

## 2019-03-26 NOTE — Telephone Encounter (Signed)
Patient called earlier and requested to speak to me.  I called her back and spoke with her on the phone.  She reported that the medication that was prescribed to her recently Viibryd is very expensive for her.  She is having to pay a co-pay of $100.  She informed her pharmacist told her that Celexa, Lexapro, Prozac, Zoloft and other older medications would be cost effective.  Since patient has tried Lexapro and has had side effects and then recently tried Prozac and also has side effects would want to try a different medication class this time.  Patient was offered venlafaxine which should be cost effective. Potential side effects of medication and risks vs benefits of treatment vs non-treatment were explained and discussed. All questions were answered. Patient was encouraged to try this medication and then discuss further at the time of her next appointment scheduled on 04/16/2019.

## 2019-04-14 ENCOUNTER — Ambulatory Visit: Payer: PPO | Admitting: Psychiatry

## 2019-04-16 ENCOUNTER — Other Ambulatory Visit: Payer: Self-pay

## 2019-04-16 ENCOUNTER — Ambulatory Visit (INDEPENDENT_AMBULATORY_CARE_PROVIDER_SITE_OTHER): Payer: PPO | Admitting: Psychiatry

## 2019-04-16 ENCOUNTER — Encounter: Payer: Self-pay | Admitting: Psychiatry

## 2019-04-16 DIAGNOSIS — Z630 Problems in relationship with spouse or partner: Secondary | ICD-10-CM

## 2019-04-16 DIAGNOSIS — F3341 Major depressive disorder, recurrent, in partial remission: Secondary | ICD-10-CM

## 2019-04-16 DIAGNOSIS — F419 Anxiety disorder, unspecified: Secondary | ICD-10-CM

## 2019-04-16 MED ORDER — VENLAFAXINE HCL ER 75 MG PO CP24: 75.0000 mg | ORAL_CAPSULE | Freq: Every day | ORAL | 1 refills | Status: DC
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
## 2019-05-01 ENCOUNTER — Telehealth: Payer: Self-pay

## 2019-05-01 NOTE — Telephone Encounter (Signed)
I think it is safe for her to get vaccine now. She does not need to wait for johnson and johnson vaccine.

## 2019-05-04 ENCOUNTER — Encounter: Payer: Self-pay | Admitting: Nurse Practitioner

## 2019-05-04 ENCOUNTER — Ambulatory Visit (INDEPENDENT_AMBULATORY_CARE_PROVIDER_SITE_OTHER): Payer: PPO | Admitting: Nurse Practitioner

## 2019-05-04 ENCOUNTER — Other Ambulatory Visit: Payer: Self-pay

## 2019-05-04 ENCOUNTER — Telehealth: Payer: Self-pay

## 2019-05-04 VITALS — Ht 66.0 in

## 2019-05-04 DIAGNOSIS — J309 Allergic rhinitis, unspecified: Secondary | ICD-10-CM | POA: Diagnosis not present

## 2019-05-04 DIAGNOSIS — J0141 Acute recurrent pansinusitis: Secondary | ICD-10-CM

## 2019-05-04 DIAGNOSIS — Z20822 Contact with and (suspected) exposure to covid-19: Secondary | ICD-10-CM | POA: Diagnosis not present

## 2019-05-04 DIAGNOSIS — E1165 Type 2 diabetes mellitus with hyperglycemia: Secondary | ICD-10-CM | POA: Diagnosis not present

## 2019-05-04 MED ORDER — AZITHROMYCIN 250 MG PO TABS: ORAL_TABLET | ORAL | 0 refills | Status: DC

## 2019-05-04 NOTE — Progress Notes (Signed)
[facility] [place], [place] [address]  Internal MEDICINE  Telephone Visit  Patient Name: Lisa Gutierrez  [id number]  [id number]  Date of Service: 05/04/2019  I connected with the patient at 11:53am by webcam and verified the patients identity using two identifiers.   I discussed the limitations, risks, security and privacy concerns of performing an evaluation and management service by webcam and the availability of in person appointments. I also discussed with the patient that there may be a patient responsible charge related to the service.  The patient expressed understanding and agrees to proceed.    Chief Complaint  Patient presents with  . Telephone Assessment  . Telephone Screen  . Fever    99-101 OVER THE LAST 5 DAYS EVERY DAY, TAKES TYLENOL, HUSBAND HAS FEVER NO GAS  . Gas    SIDE HURTS, HAD A SALAD ABOUT 5 DAYS AGO AND STARTED AROUND THAT TIME, RELIEVED BY USING THE BATHROOM  . Diarrhea  . Sinusitis    STARTED WITH SINUS DRAINAGE AND SCRATCHY THROAT    The patient has been contacted via webcam for follow up visit due to concerns for spread of novel coronavirus. The patient presents for acute visit. The patient is complaining of scratchy throat., nasal congestion, post nasal drip. Started early last week. She had low-grade fever. States that her husband has similar symptoms. The patient states that she has pain in upper left part of the abdomen and some loose stools. She denies weakness or body aches. She has not felt well at all. The patient states that she was around her young granddaughter who was positive for COVID 19.       Current Medication: Outpatient Encounter Medications as of 05/04/2019  Medication Sig Note  . acetaminophen (TYLENOL) 500 MG tablet Take 2 tablets by mouth as needed. 02/28/2016: Received from: [facility]: 1-2 tablets by mouth single dose as needed for pain  . acetic  acid-hydrocortisone (VOSOL-HC) OTIC solution Place 3 drops into both ears 3 (three) times daily.   Marland Kitchen ALPRAZolam (XANAX) 0.5 MG tablet Take 1 tablet (0.5 mg total) by mouth 2 (two) times daily as needed for anxiety.   . Calcium Citrate-Vitamin D (CALCIUM + D PO) Take 1 tablet by mouth daily.   . Flaxseed, Linseed, (FLAXSEED OIL PO) Take 1,500 mg by mouth 2 (two) times daily.   . fluconazole (DIFLUCAN) 150 MG tablet Take 1  tab  Po once for yeast infection and repeat in 3 days if symptoms persist   . fluticasone (FLONASE) 50 MCG/ACT nasal spray Place 2 sprays into both nostrils daily as needed.    Marland Kitchen glucose blood (ACCU-CHEK SMARTVIEW) test strip 1 each by Other route as needed for other. Use as instructed   . LINZESS 145 MCG CAPS capsule Take 1 tab po daily   . metoprolol succinate (TOPROL-XL) 25 MG 24 hr tablet Take 2 tablets (50 mg total) by mouth daily.   . Multiple Vitamins-Minerals (MULTIVITAMIN ADULT PO) Take 1 tablet by mouth daily.   Marland Kitchen nystatin (MYCOSTATIN/NYSTOP) powder Apply topically 4 (four) times daily.   . pantoprazole (PROTONIX) 40 MG tablet Take 1 tablet (40 mg total) by mouth 2 (two) times daily.   . polyethylene glycol powder (GLYCOLAX/MIRALAX) powder Take by mouth.   . pramoxine-hydrocortisone cream Apply topically 3 (three) times daily.   . sitaGLIPtin-metformin (JANUMET) 50-500 MG tablet Take 1 tablet by mouth 2 (two) times daily with a meal.   .  Suvorexant (BELSOMRA) 10 MG TABS Take 10 mg by mouth at bedtime as needed.   Marland Kitchen telmisartan-hydrochlorothiazide (MICARDIS HCT) 80-25 MG tablet Take 1 tablet by mouth daily.   Marland Kitchen UNABLE TO FIND Estriol 1 mg/g Use 1/4 app per vagina 1-2 times weekly Disp 30 g tube with 2 rf Called to [facility] 01/[facility]/2018: compound medication for vaginal use  . venlafaxine XR (EFFEXOR XR) 75 MG 24 hr capsule Take 1 capsule (75 mg total) by mouth daily with breakfast.   . azithromycin (ZITHROMAX) 250 MG tablet z-pack - take as directed for 5 days for  sinusitis.   . [DISCONTINUED] azithromycin (ZITHROMAX) 250 MG tablet Take 2 tablets po on day one, then take  One tablet po on days two through ten (Patient not taking: Reported on 05/04/2019)    No facility-administered encounter medications on file as of 05/04/2019.    Surgical History: Past Surgical History:  Procedure Laterality Date  . BREAST EXCISIONAL BIOPSY    . CHOLECYSTECTOMY    . EXCISION / BIOPSY BREAST / NIPPLE / DUCT Right 1973   duct removed    Medical History: Past Medical History:  Diagnosis Date  . Anxiety   . Depression   . Diabetes mellitus without complication ([place])   . GERD (gastroesophageal reflux disease)   . Hypertension   . IBS (irritable bowel syndrome)   . Neuromuscular disorder ([place])     Family History: Family History  Problem Relation Age of Onset  . Diabetes Mother   . Hypertension Mother   . Coronary artery disease Father   . Glaucoma Father   . Breast cancer Neg Hx     Social History   Socioeconomic History  . Marital status: Married    Spouse name: Not on file  . Number of children: Not on file  . Years of education: Not on file  . Highest education level: Not on file  Occupational History  . Not on file  Tobacco Use  . Smoking status: Former Research scientist (life sciences)  . Smokeless tobacco: Never Used  Substance and Sexual Activity  . Alcohol use: No    Alcohol/week: 0.0 standard drinks  . Drug use: Never  . Sexual activity: Not on file  Other Topics Concern  . Not on file  Social History Narrative  . Not on file   Social Determinants of Health   Financial Resource Strain:   . Difficulty of Paying Living Expenses: Not on file  Food Insecurity:   . Worried About Charity fundraiser in the Last Year: Not on file  . Ran Out of Food in the Last Year: Not on file  Transportation Needs:   . Lack of Transportation (Medical): Not on file  . Lack of Transportation (Non-Medical): Not on file  Physical Activity:   . Days of Exercise per Week:  Not on file  . Minutes of Exercise per Session: Not on file  Stress:   . Feeling of Stress : Not on file  Social Connections:   . Frequency of Communication with Friends and Family: Not on file  . Frequency of Social Gatherings with Friends and Family: Not on file  . Attends Religious Services: Not on file  . Active Member of Clubs or Organizations: Not on file  . Attends Archivist Meetings: Not on file  . Marital Status: Not on file  Intimate Partner Violence:   . Fear of Current or Ex-Partner: Not on file  . Emotionally Abused: Not on file  . Physically  Abused: Not on file  . Sexually Abused: Not on file      Review of Systems  Constitutional: Positive for fatigue and fever. Negative for activity change, chills and unexpected weight change.  HENT: Positive for congestion, postnasal drip and rhinorrhea. Negative for sinus pressure, sinus pain, sneezing and sore throat.        Scratchy throat.   Respiratory: Negative for cough, chest tightness, shortness of breath and wheezing.   Cardiovascular: Negative for chest pain and palpitations.  Gastrointestinal: Negative for constipation, diarrhea, nausea and vomiting.  Endocrine: Negative for cold intolerance, heat intolerance, polydipsia and polyuria.       Blood sugars doing well   Musculoskeletal: Negative for arthralgias, back pain, joint swelling and neck pain.  Skin: Negative for rash.  Allergic/Immunologic: Positive for environmental allergies.  Neurological: Positive for headaches. Negative for dizziness, tremors, weakness and numbness.  Hematological: Negative for adenopathy. Does not bruise/bleed easily.  Psychiatric/Behavioral: Positive for dysphoric mood. Negative for behavioral problems (Depression), sleep disturbance and suicidal ideas. The patient is nervous/anxious.        Patient is doing much better with anxiety and depression since seeing psychiatry.     Today's Vitals   05/04/19 1045  Height: 5\' 6"   (1.676 m)   Body mass index is 38.41 kg/m.  Observation/Objective:   The patient is alert and oriented. She is pleasant and answers all questions appropriately. Breathing is non-labored. She is in no acute distress at this time. The patient has mild nasal congestion.    Assessment/Plan:  1. Acute recurrent pansinusitis Start z-pack. Take as directed for 5 days. Rest and increase fluids. Take OTC medications as needed and as indicated for acute symptoms.  - azithromycin (ZITHROMAX) 250 MG tablet; z-pack - take as directed for 5 days for sinusitis.  Dispense: 6 tablet; Refill: 0  2. Exposure to COVID-19 virus Young granddaughter has had COVID 78. Recommended patient be tested for COVID 19.   3. Chronic allergic rhinitis Continue allergy medication and nasal spray as prescribed   4. Type 2 diabetes mellitus with hyperglycemia, without long-term current use of insulin ([place]) Continue diabetic medication as prescribed   General Counseling: Bree verbalizes understanding of the findings of today's phone visit and agrees with plan of treatment. I have discussed any further diagnostic evaluation that may be needed or ordered today. We also reviewed her medications today. she has been encouraged to call the office with any questions or concerns that should arise related to todays visit.      Person Under Monitoring Name: Liba Sibert  Location: [address] [place] [address]   Infection Prevention Recommendations for Individuals Confirmed to have, or Being Evaluated for, 2019 Novel Coronavirus (COVID-19) Infection Who Receive Care at Home  Individuals who are confirmed to have, or are being evaluated for, COVID-19 should follow the prevention steps below until a healthcare provider or local or state health department says they can return to normal activities.  Stay home except to get medical care You should restrict activities outside your home, except for  getting medical care. Do not go to work, school, or public areas, and do not use public transportation or taxis.  Call ahead before visiting your doctor Before your medical appointment, call the healthcare provider and tell them that you have, or are being evaluated for, COVID-19 infection. This will help the healthcare provider's office take steps to keep other people from getting infected. Ask your healthcare provider to call the local  or state health department.  Monitor your symptoms Seek prompt medical attention if your illness is worsening (e.g., difficulty breathing). Before going to your medical appointment, call the healthcare provider and tell them that you have, or are being evaluated for, COVID-19 infection. Ask your healthcare provider to call the local or state health department.  Wear a facemask You should wear a facemask that covers your nose and mouth when you are in the same room with other people and when you visit a healthcare provider. People who live with or visit you should also wear a facemask while they are in the same room with you.  Separate yourself from other people in your home As much as possible, you should stay in a different room from other people in your home. Also, you should use a separate bathroom, if available.  Avoid sharing household items You should not share dishes, drinking glasses, cups, eating utensils, towels, bedding, or other items with other people in your home. After using these items, you should wash them thoroughly with soap and water.  Cover your coughs and sneezes Cover your mouth and nose with a tissue when you cough or sneeze, or you can cough or sneeze into your sleeve. Throw used tissues in a lined trash can, and immediately wash your hands with soap and water for at least 20 seconds or use an alcohol-based hand rub.  Wash your [facility] your hands often and thoroughly with soap and water for at least 20 seconds. You can use  an alcohol-based hand sanitizer if soap and water are not available and if your hands are not visibly dirty. Avoid touching your eyes, nose, and mouth with unwashed hands.   Prevention Steps for Caregivers and Household Members of Individuals Confirmed to have, or Being Evaluated for, COVID-19 Infection Being Cared for in the Home  If you live with, or provide care at home for, a person confirmed to have, or being evaluated for, COVID-19 infection please follow these guidelines to prevent infection:  Follow healthcare provider's instructions Make sure that you understand and can help the patient follow any healthcare provider instructions for all care.  Provide for the patient's basic needs You should help the patient with basic needs in the home and provide support for getting groceries, prescriptions, and other personal needs.  Monitor the patient's symptoms If they are getting sicker, call his or her medical provider and tell them that the patient has, or is being evaluated for, COVID-19 infection. This will help the healthcare provider's office take steps to keep other people from getting infected. Ask the healthcare provider to call the local or state health department.  Limit the number of people who have contact with the patient  If possible, have only one caregiver for the patient.  Other household members should stay in another home or place of residence. If this is not possible, they should stay  in another room, or be separated from the patient as much as possible. Use a separate bathroom, if available.  Restrict visitors who do not have an essential need to be in the home.  Keep older adults, very young children, and other sick people away from the patient Keep older adults, very young children, and those who have compromised immune systems or chronic health conditions away from the patient. This includes people with chronic heart, lung, or kidney conditions, diabetes,  and cancer.  Ensure good ventilation Make sure that shared spaces in the home have good air flow, such  as from an air conditioner or an opened window, weather permitting.  Wash your hands often  Wash your hands often and thoroughly with soap and water for at least 20 seconds. You can use an alcohol based hand sanitizer if soap and water are not available and if your hands are not visibly dirty.  Avoid touching your eyes, nose, and mouth with unwashed hands.  Use disposable paper towels to dry your hands. If not available, use dedicated cloth towels and replace them when they become wet.  Wear a facemask and gloves  Wear a disposable facemask at all times in the room and gloves when you touch or have contact with the patient's blood, body fluids, and/or secretions or excretions, such as sweat, saliva, sputum, nasal mucus, vomit, urine, or feces.  Ensure the mask fits over your nose and mouth tightly, and do not touch it during use.  Throw out disposable facemasks and gloves after using them. Do not reuse.  Wash your hands immediately after removing your facemask and gloves.  If your personal clothing becomes contaminated, carefully remove clothing and launder. Wash your hands after handling contaminated clothing.  Place all used disposable facemasks, gloves, and other waste in a lined container before disposing them with other household waste.  Remove gloves and wash your hands immediately after handling these items.  Do not share dishes, glasses, or other household items with the patient  Avoid sharing household items. You should not share dishes, drinking glasses, cups, eating utensils, towels, bedding, or other items with a patient who is confirmed to have, or being evaluated for, COVID-19 infection.  After the person uses these items, you should wash them thoroughly with soap and water.  Wash laundry thoroughly  Immediately remove and wash clothes or bedding that have blood,  body fluids, and/or secretions or excretions, such as sweat, saliva, sputum, nasal mucus, vomit, urine, or feces, on them.  Wear gloves when handling laundry from the patient.  Read and follow directions on labels of laundry or clothing items and detergent. In general, wash and dry with the warmest temperatures recommended on the label.  Clean all areas the individual has used often  Clean all touchable surfaces, such as counters, tabletops, doorknobs, bathroom fixtures, toilets, phones, keyboards, tablets, and bedside tables, every day. Also, clean any surfaces that may have blood, body fluids, and/or secretions or excretions on them.  Wear gloves when cleaning surfaces the patient has come in contact with.  Use a diluted bleach solution (e.g., dilute bleach with 1 part bleach and 10 parts water) or a household disinfectant with a label that says EPA-registered for coronaviruses. To make a bleach solution at home, add 1 tablespoon of bleach to 1 quart (4 cups) of water. For a larger supply, add  cup of bleach to 1 gallon (16 cups) of water.  Read labels of cleaning products and follow recommendations provided on product labels. Labels contain instructions for safe and effective use of the cleaning product including precautions you should take when applying the product, such as wearing gloves or eye protection and making sure you have good ventilation during use of the product.  Remove gloves and wash hands immediately after cleaning.  Monitor yourself for signs and symptoms of illness Caregivers and household members are considered close contacts, should monitor their health, and will be asked to limit movement outside of the home to the extent possible. Follow the monitoring steps for close contacts listed on the symptom monitoring form.   ?  If you have additional questions, contact your local health department or call the epidemiologist on call at [phone] (available 24/7). ? This  guidance is subject to change. For the most up-to-date guidance from [facility], please refer to their website: [email or web address].pl   This patient was seen by [name] FNP Collaboration with Dr Lavera Guise as a part of collaborative care agreement  Meds ordered this encounter  Medications  . azithromycin (ZITHROMAX) 250 MG tablet    Sig: z-pack - take as directed for 5 days for sinusitis.    Dispense:  6 tablet    Refill:  0    Order Specific Question:   Supervising Provider    Answer:   Lavera Guise [id number]    Time spent: 83 Minutes    Dr Lavera Guise Internal medicine

## 2019-05-04 NOTE — Telephone Encounter (Signed)
Called lmom informing patient that [name] stated it will be okay to get the covid vaccine that is out now does not need to wait till Wynetta Emery and Wynetta Emery vaccine comes out. [employer]

## 2019-05-11 ENCOUNTER — Other Ambulatory Visit: Payer: Self-pay | Admitting: Nurse Practitioner

## 2019-05-11 ENCOUNTER — Telehealth: Payer: Self-pay

## 2019-05-11 DIAGNOSIS — F411 Generalized anxiety disorder: Secondary | ICD-10-CM

## 2019-05-11 MED ORDER — ALPRAZOLAM 0.5 MG PO TABS: 0.5000 mg | ORAL_TABLET | Freq: Two times a day (BID) | ORAL | 2 refills | Status: DC | PRN

## 2019-05-11 NOTE — Telephone Encounter (Signed)
Refilled prescription for alprazolam and sent to [facility] for her.

## 2019-05-11 NOTE — Progress Notes (Signed)
Refilled prescription for alprazolam and sent to [facility] for her.

## 2019-05-14 ENCOUNTER — Ambulatory Visit: Payer: PPO | Admitting: Nurse Practitioner

## 2019-05-15 ENCOUNTER — Telehealth: Payer: Self-pay

## 2019-05-15 ENCOUNTER — Other Ambulatory Visit: Payer: Self-pay

## 2019-05-15 MED ORDER — LINZESS 145 MCG PO CAPS: ORAL_CAPSULE | ORAL | 1 refills | Status: DC

## 2019-05-15 NOTE — Telephone Encounter (Signed)
Patient called requesting a refill of Linzess. Does have an appointment scheduled end of next week, needing before then.

## 2019-05-20 ENCOUNTER — Telehealth: Payer: Self-pay

## 2019-05-20 NOTE — Telephone Encounter (Signed)
CONFIRMED 05-22-19 OV AS VIRTUAL UPON PATIENT REQUEST.

## 2019-05-22 ENCOUNTER — Encounter: Payer: Self-pay | Admitting: Nurse Practitioner

## 2019-05-22 ENCOUNTER — Ambulatory Visit (INDEPENDENT_AMBULATORY_CARE_PROVIDER_SITE_OTHER): Payer: PPO | Admitting: Nurse Practitioner

## 2019-05-22 VITALS — Ht 66.0 in | Wt 235.0 lb

## 2019-05-22 DIAGNOSIS — Z20822 Contact with and (suspected) exposure to covid-19: Secondary | ICD-10-CM | POA: Diagnosis not present

## 2019-05-22 DIAGNOSIS — J0141 Acute recurrent pansinusitis: Secondary | ICD-10-CM

## 2019-05-22 DIAGNOSIS — R42 Dizziness and giddiness: Secondary | ICD-10-CM | POA: Diagnosis not present

## 2019-05-22 DIAGNOSIS — E1165 Type 2 diabetes mellitus with hyperglycemia: Secondary | ICD-10-CM

## 2019-05-22 MED ORDER — MECLIZINE HCL 25 MG PO TABS: 25.0000 mg | ORAL_TABLET | Freq: Two times a day (BID) | ORAL | 1 refills | Status: DC | PRN

## 2019-05-22 MED ORDER — AZITHROMYCIN 250 MG PO TABS: ORAL_TABLET | ORAL | 0 refills | Status: DC

## 2019-05-22 NOTE — Progress Notes (Signed)
[facility] [place], [place] [address]  Internal MEDICINE  Telephone Visit  Patient Name: Lisa Gutierrez  [id number]  [id number]  Date of Service: 05/22/2019  I connected with the patient at 1:42pm by telephone and verified the patients identity using two identifiers.   I discussed the limitations, risks, security and privacy concerns of performing an evaluation and management service by telephone and the availability of in person appointments. I also discussed with the patient that there may be a patient responsible charge related to the service.  The patient expressed understanding and agrees to proceed.    Chief Complaint  Patient presents with  . Telephone Assessment  . Telephone Screen  . Depression  . Diabetes  . Hypertension    The patient has been contacted via webcam for follow up visit due to concerns for spread of novel coronavirus. The patient presents for routine follow up visit. She was recently diagnosed with COVID 19.  She was treated with antibiotics for sinus infection prior to diagnosis with COVID. She states that symptoms have progressed from upper respiratory symptoms to abdominal pain and diarrhea. She states that upper respiratory symptoms are returning. She has nasal congestion and sinus headache. She also has post nasal drip.has been blowing out some blood tinged mucus. She has had low grade fever for past few days. Has also had some episodes of vertigo. When checking blood sugar it is on low side. Makes her feel very shaky and sweaty. She even feels nauseated.       Current Medication: Outpatient Encounter Medications as of 05/22/2019  Medication Sig Note  . acetaminophen (TYLENOL) 500 MG tablet Take 2 tablets by mouth as needed. 02/28/2016: Received from: [place]: 1-2 tablets by mouth single dose as needed for pain  . acetic acid-hydrocortisone (VOSOL-HC) OTIC solution Place 3 drops into both  ears 3 (three) times daily.   Marland Kitchen ALPRAZolam (XANAX) 0.5 MG tablet Take 1 tablet (0.5 mg total) by mouth 2 (two) times daily as needed for anxiety.   Marland Kitchen azithromycin (ZITHROMAX) 250 MG tablet z-pack - take as directed for 5 days for sinusitis.   . Calcium Citrate-Vitamin D (CALCIUM + D PO) Take 1 tablet by mouth daily.   . Flaxseed, Linseed, (FLAXSEED OIL PO) Take 1,500 mg by mouth 2 (two) times daily.   . fluconazole (DIFLUCAN) 150 MG tablet Take 1  tab  Po once for yeast infection and repeat in 3 days if symptoms persist   . fluticasone (FLONASE) 50 MCG/ACT nasal spray Place 2 sprays into both nostrils daily as needed.    Marland Kitchen glucose blood (ACCU-CHEK SMARTVIEW) test strip 1 each by Other route as needed for other. Use as instructed   . LINZESS 145 MCG CAPS capsule Take 1 tab po daily   . metoprolol succinate (TOPROL-XL) 25 MG 24 hr tablet Take 2 tablets (50 mg total) by mouth daily.   . Multiple Vitamins-Minerals (MULTIVITAMIN ADULT PO) Take 1 tablet by mouth daily.   Marland Kitchen nystatin (MYCOSTATIN/NYSTOP) powder Apply topically 4 (four) times daily.   . pantoprazole (PROTONIX) 40 MG tablet Take 1 tablet (40 mg total) by mouth 2 (two) times daily.   . polyethylene glycol powder (GLYCOLAX/MIRALAX) powder Take by mouth.   . pramoxine-hydrocortisone cream Apply topically 3 (three) times daily.   . sitaGLIPtin-metformin (JANUMET) 50-500 MG tablet Take 1 tablet by mouth 2 (two) times daily with a meal.   . Suvorexant (BELSOMRA) 10 MG TABS Take  10 mg by mouth at bedtime as needed.   Marland Kitchen telmisartan-hydrochlorothiazide (MICARDIS HCT) 80-25 MG tablet Take 1 tablet by mouth daily.   Marland Kitchen UNABLE TO FIND Estriol 1 mg/g Use 1/4 app per vagina 1-2 times weekly Disp 30 g tube with 2 rf Called to [facility] 01/03/2018: compound medication for vaginal use  . venlafaxine XR (EFFEXOR XR) 75 MG 24 hr capsule Take 1 capsule (75 mg total) by mouth daily with breakfast.   . [DISCONTINUED] azithromycin (ZITHROMAX) 250 MG tablet z-pack  - take as directed for 5 days for sinusitis.   Marland Kitchen meclizine (ANTIVERT) 25 MG tablet Take 1 tablet (25 mg total) by mouth 2 (two) times daily as needed for dizziness.    No facility-administered encounter medications on file as of 05/22/2019.    Surgical History: Past Surgical History:  Procedure Laterality Date  . BREAST EXCISIONAL BIOPSY    . CHOLECYSTECTOMY    . EXCISION / BIOPSY BREAST / NIPPLE / DUCT Right 1973   duct removed    Medical History: Past Medical History:  Diagnosis Date  . Anxiety   . Depression   . Diabetes mellitus without complication ([place])   . GERD (gastroesophageal reflux disease)   . Hypertension   . IBS (irritable bowel syndrome)   . Neuromuscular disorder ([place])     Family History: Family History  Problem Relation Age of Onset  . Diabetes Mother   . Hypertension Mother   . Coronary artery disease Father   . Glaucoma Father   . Breast cancer Neg Hx     Social History   Socioeconomic History  . Marital status: Married    Spouse name: Not on file  . Number of children: Not on file  . Years of education: Not on file  . Highest education level: Not on file  Occupational History  . Not on file  Tobacco Use  . Smoking status: Former Research scientist (life sciences)  . Smokeless tobacco: Never Used  Substance and Sexual Activity  . Alcohol use: No    Alcohol/week: 0.0 standard drinks  . Drug use: Never  . Sexual activity: Not on file  Other Topics Concern  . Not on file  Social History Narrative  . Not on file   Social Determinants of Health   Financial Resource Strain:   . Difficulty of Paying Living Expenses: Not on file  Food Insecurity:   . Worried About Charity fundraiser in the Last Year: Not on file  . Ran Out of Food in the Last Year: Not on file  Transportation Needs:   . Lack of Transportation (Medical): Not on file  . Lack of Transportation (Non-Medical): Not on file  Physical Activity:   . Days of Exercise per Week: Not on file  . Minutes of  Exercise per Session: Not on file  Stress:   . Feeling of Stress : Not on file  Social Connections:   . Frequency of Communication with Friends and Family: Not on file  . Frequency of Social Gatherings with Friends and Family: Not on file  . Attends Religious Services: Not on file  . Active Member of Clubs or Organizations: Not on file  . Attends Archivist Meetings: Not on file  . Marital Status: Not on file  Intimate Partner Violence:   . Fear of Current or Ex-Partner: Not on file  . Emotionally Abused: Not on file  . Physically Abused: Not on file  . Sexually Abused: Not on file  Review of Systems  Constitutional: Positive for activity change, fatigue and fever. Negative for chills and unexpected weight change.  HENT: Positive for congestion, postnasal drip and rhinorrhea. Negative for sinus pressure, sinus pain, sneezing and sore throat.   Respiratory: Negative for cough, chest tightness, shortness of breath and wheezing.   Cardiovascular: Negative for chest pain and palpitations.  Gastrointestinal: Positive for nausea and vomiting. Negative for constipation and diarrhea.  Endocrine: Negative for cold intolerance, heat intolerance, polydipsia and polyuria.       Has had a few episodes of low blood sugars  Musculoskeletal: Negative for arthralgias, back pain, joint swelling and neck pain.  Skin: Negative for rash.  Allergic/Immunologic: Positive for environmental allergies.  Neurological: Positive for headaches. Negative for dizziness, tremors, weakness and numbness.  Hematological: Negative for adenopathy. Does not bruise/bleed easily.  Psychiatric/Behavioral: Positive for dysphoric mood. Negative for behavioral problems (Depression), sleep disturbance and suicidal ideas. The patient is nervous/anxious.        Patient is doing much better with anxiety and depression since seeing psychiatry.     Today's Vitals   05/22/19 1045  Weight: 235 lb (106.6 kg)  Height:  5\' 6"  (1.676 m)   Body mass index is 37.93 kg/m.   Observation/Objective:   The patient is alert and oriented. She is pleasant and answers all questions appropriately. Breathing is non-labored. She is in no acute distress at this time. The patient is nasally congested and appears to feel poorly.    Assessment/Plan: 1. Acute recurrent pansinusitis Will repeat z-pack. Take as directed for 5 days. Rest and increase fluids. Continue to treat acute symptoms with OTC medication. - azithromycin (ZITHROMAX) 250 MG tablet; z-pack - take as directed for 5 days for sinusitis.  Dispense: 6 tablet; Refill: 0  2. Exposure to COVID-19 virus Patient with presumed COVID 19 infection. Continues to suffer symptoms related to diagnosis.   3. Vertigo Prescription for antivert 25mg  may be taken up to twice daily as needed for dizziness and nausea.  - meclizine (ANTIVERT) 25 MG tablet; Take 1 tablet (25 mg total) by mouth 2 (two) times daily as needed for dizziness.  Dispense: 60 tablet; Refill: 1  4. Type 2 diabetes mellitus with hyperglycemia, without long-term current use of insulin (HCC) Overall, stable. Continue diabetic medication as prescribed.   General Counseling: alecia vanderwoude understanding of the findings of today's phone visit and agrees with plan of treatment. I have discussed any further diagnostic evaluation that may be needed or ordered today. We also reviewed her medications today. she has been encouraged to call the office with any questions or concerns that should arise related to todays visit.   This patient was seen by [place] with Dr Lavera Guise as a part of collaborative care agreement  Meds ordered this encounter  Medications  . meclizine (ANTIVERT) 25 MG tablet    Sig: Take 1 tablet (25 mg total) by mouth 2 (two) times daily as needed for dizziness.    Dispense:  60 tablet    Refill:  1    Order Specific Question:   Supervising Provider    Answer:    Lavera Guise [id number]  . azithromycin (ZITHROMAX) 250 MG tablet    Sig: z-pack - take as directed for 5 days for sinusitis.    Dispense:  6 tablet    Refill:  0    Order Specific Question:   Supervising Provider    Answer:   Lavera Guise [id number]  Time spent: 9 Minutes    Dr Lavera Guise Internal medicine

## 2019-05-25 ENCOUNTER — Other Ambulatory Visit: Payer: Self-pay

## 2019-05-25 DIAGNOSIS — K219 Gastro-esophageal reflux disease without esophagitis: Secondary | ICD-10-CM

## 2019-05-25 MED ORDER — PANTOPRAZOLE SODIUM 40 MG PO TBEC: 40.0000 mg | DELAYED_RELEASE_TABLET | Freq: Two times a day (BID) | ORAL | 3 refills | Status: DC

## 2019-06-10 ENCOUNTER — Other Ambulatory Visit: Payer: Self-pay

## 2019-06-10 ENCOUNTER — Encounter: Payer: Self-pay | Admitting: Psychiatry

## 2019-06-10 ENCOUNTER — Ambulatory Visit (INDEPENDENT_AMBULATORY_CARE_PROVIDER_SITE_OTHER): Payer: PPO | Admitting: Psychiatry

## 2019-06-10 DIAGNOSIS — Z630 Problems in relationship with spouse or partner: Secondary | ICD-10-CM | POA: Diagnosis not present

## 2019-06-10 DIAGNOSIS — F3341 Major depressive disorder, recurrent, in partial remission: Secondary | ICD-10-CM | POA: Diagnosis not present

## 2019-06-10 DIAGNOSIS — F419 Anxiety disorder, unspecified: Secondary | ICD-10-CM | POA: Diagnosis not present

## 2019-06-10 MED ORDER — VENLAFAXINE HCL ER 75 MG PO CP24: 75.0000 mg | ORAL_CAPSULE | Freq: Every day | ORAL | 1 refills | Status: DC
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
## 2019-06-14 ENCOUNTER — Ambulatory Visit: Payer: PPO | Attending: Internal Medicine

## 2019-06-14 DIAGNOSIS — Z23 Encounter for immunization: Secondary | ICD-10-CM | POA: Insufficient documentation

## 2019-06-14 NOTE — Progress Notes (Signed)
   Covid-19 Vaccination Clinic  Name:  Lisa Gutierrez    MRN: [id number] DOB: 1950-10-14  06/14/2019  [name] was observed post Covid-19 immunization for 15 minutes without incidence. She was provided with Vaccine Information Sheet and instruction to access the V-Safe system.   [name] was instructed to call 911 with any severe reactions post vaccine: Marland Kitchen Difficulty breathing  . Swelling of your face and throat  . A fast heartbeat  . A bad rash all over your body  . Dizziness and weakness    Immunizations Administered    Name Date Dose VIS Date Route   Pfizer COVID-19 Vaccine 06/14/2019 11:15 AM 0.3 mL 03/27/2019 Intramuscular   Manufacturer: [place]   Lot: [id number]   [place]: [id number]

## 2019-07-03 ENCOUNTER — Ambulatory Visit: Payer: PPO | Admitting: Nurse Practitioner

## 2019-07-08 ENCOUNTER — Other Ambulatory Visit: Payer: Self-pay

## 2019-07-08 MED ORDER — LINZESS 145 MCG PO CAPS: ORAL_CAPSULE | ORAL | 3 refills | Status: DC

## 2019-07-10 DIAGNOSIS — D2262 Melanocytic nevi of left upper limb, including shoulder: Secondary | ICD-10-CM | POA: Diagnosis not present

## 2019-07-10 DIAGNOSIS — D2261 Melanocytic nevi of right upper limb, including shoulder: Secondary | ICD-10-CM | POA: Diagnosis not present

## 2019-07-10 DIAGNOSIS — L298 Other pruritus: Secondary | ICD-10-CM | POA: Diagnosis not present

## 2019-07-10 DIAGNOSIS — L820 Inflamed seborrheic keratosis: Secondary | ICD-10-CM | POA: Diagnosis not present

## 2019-07-10 DIAGNOSIS — L853 Xerosis cutis: Secondary | ICD-10-CM | POA: Diagnosis not present

## 2019-07-10 DIAGNOSIS — D2272 Melanocytic nevi of left lower limb, including hip: Secondary | ICD-10-CM | POA: Diagnosis not present

## 2019-07-10 DIAGNOSIS — L538 Other specified erythematous conditions: Secondary | ICD-10-CM | POA: Diagnosis not present

## 2019-07-14 ENCOUNTER — Ambulatory Visit: Payer: PPO | Attending: Internal Medicine

## 2019-07-14 DIAGNOSIS — Z23 Encounter for immunization: Secondary | ICD-10-CM

## 2019-07-14 NOTE — Progress Notes (Signed)
   Covid-19 Vaccination Clinic  Name:  Lisa Gutierrez    MRN: [id number] DOB: [birth year]  07/14/2019  Lisa Gutierrez was observed post Covid-19 immunization for 15 minutes without incident. She was provided with Vaccine Information Sheet and instruction to access the V-Safe system.   Lisa Gutierrez was instructed to call 911 with any severe reactions post vaccine: Marland Kitchen Difficulty breathing  . Swelling of face and throat  . A fast heartbeat  . A bad rash all over body  . Dizziness and weakness   Immunizations Administered    Name Date Dose VIS Date Route   Pfizer COVID-19 Vaccine 07/14/2019 12:36 PM 0.3 mL 03/27/2019 Intramuscular   Manufacturer: [place]   Lot: [id number]   [place]: [id number]

## 2019-07-22 ENCOUNTER — Ambulatory Visit: Payer: PPO | Admitting: Psychiatry

## 2019-07-28 ENCOUNTER — Telehealth: Payer: Self-pay

## 2019-07-28 NOTE — Telephone Encounter (Signed)
lmom to confirm and screen for 07-30-19 ov.

## 2019-07-30 ENCOUNTER — Ambulatory Visit: Payer: PPO | Admitting: Nurse Practitioner

## 2019-08-11 ENCOUNTER — Telehealth (INDEPENDENT_AMBULATORY_CARE_PROVIDER_SITE_OTHER): Payer: PPO | Admitting: Psychiatry

## 2019-08-11 ENCOUNTER — Other Ambulatory Visit: Payer: Self-pay

## 2019-08-11 ENCOUNTER — Encounter: Payer: Self-pay | Admitting: Psychiatry

## 2019-08-11 DIAGNOSIS — F3341 Major depressive disorder, recurrent, in partial remission: Secondary | ICD-10-CM | POA: Diagnosis not present

## 2019-08-11 DIAGNOSIS — F419 Anxiety disorder, unspecified: Secondary | ICD-10-CM | POA: Diagnosis not present

## 2019-08-11 DIAGNOSIS — Z630 Problems in relationship with spouse or partner: Secondary | ICD-10-CM

## 2019-08-11 MED ORDER — VENLAFAXINE HCL ER 75 MG PO CP24: 75.0000 mg | ORAL_CAPSULE | Freq: Every day | ORAL | 1 refills | Status: DC
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
## 2019-08-23 ENCOUNTER — Other Ambulatory Visit: Payer: Self-pay | Admitting: Adult Health

## 2019-08-23 MED ORDER — HYDROCORTISONE (PERIANAL) 2.5 % EX CREA: 1.0000 "application " | TOPICAL_CREAM | Freq: Two times a day (BID) | CUTANEOUS | 0 refills | Status: DC

## 2019-08-23 NOTE — Progress Notes (Signed)
Sent RX for Hemmorrhoid cream.

## 2019-08-25 ENCOUNTER — Other Ambulatory Visit: Payer: Self-pay

## 2019-08-25 DIAGNOSIS — I10 Essential (primary) hypertension: Secondary | ICD-10-CM

## 2019-08-25 MED ORDER — TELMISARTAN-HCTZ 80-25 MG PO TABS: 1.0000 | ORAL_TABLET | Freq: Every day | ORAL | 1 refills | Status: DC

## 2019-08-31 ENCOUNTER — Other Ambulatory Visit: Payer: Self-pay

## 2019-08-31 DIAGNOSIS — I10 Essential (primary) hypertension: Secondary | ICD-10-CM

## 2019-08-31 MED ORDER — METOPROLOL SUCCINATE ER 25 MG PO TB24: 50.0000 mg | ORAL_TABLET | Freq: Every day | ORAL | 0 refills | Status: DC

## 2019-09-04 ENCOUNTER — Other Ambulatory Visit: Payer: Self-pay

## 2019-09-04 DIAGNOSIS — B372 Candidiasis of skin and nail: Secondary | ICD-10-CM

## 2019-09-04 MED ORDER — NYSTATIN 100000 UNIT/GM EX POWD: Freq: Four times a day (QID) | CUTANEOUS | 0 refills | Status: DC

## 2019-09-17 ENCOUNTER — Telehealth (INDEPENDENT_AMBULATORY_CARE_PROVIDER_SITE_OTHER): Payer: PPO | Admitting: Psychiatry

## 2019-09-17 ENCOUNTER — Encounter (HOSPITAL_COMMUNITY): Payer: Self-pay | Admitting: Psychiatry

## 2019-09-17 ENCOUNTER — Telehealth: Payer: PPO | Admitting: Psychiatry

## 2019-09-17 ENCOUNTER — Other Ambulatory Visit: Payer: Self-pay

## 2019-09-17 DIAGNOSIS — F3341 Major depressive disorder, recurrent, in partial remission: Secondary | ICD-10-CM | POA: Diagnosis not present

## 2019-09-17 DIAGNOSIS — F419 Anxiety disorder, unspecified: Secondary | ICD-10-CM | POA: Diagnosis not present

## 2019-09-17 DIAGNOSIS — Z630 Problems in relationship with spouse or partner: Secondary | ICD-10-CM | POA: Diagnosis not present

## 2019-09-17 MED ORDER — VENLAFAXINE HCL ER 75 MG PO CP24: 75.0000 mg | ORAL_CAPSULE | Freq: Every day | ORAL | 1 refills | Status: DC
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
## 2019-09-23 DIAGNOSIS — L309 Dermatitis, unspecified: Secondary | ICD-10-CM | POA: Diagnosis not present

## 2019-09-28 ENCOUNTER — Other Ambulatory Visit: Payer: Self-pay

## 2019-09-28 DIAGNOSIS — I10 Essential (primary) hypertension: Secondary | ICD-10-CM

## 2019-09-28 MED ORDER — METOPROLOL SUCCINATE ER 25 MG PO TB24
50.0000 mg | ORAL_TABLET | Freq: Every day | ORAL | 0 refills | Status: DC
Start: 2019-09-28 — End: 2020-02-01

## 2019-10-09 ENCOUNTER — Telehealth: Payer: Self-pay

## 2019-10-09 ENCOUNTER — Other Ambulatory Visit: Payer: Self-pay

## 2019-10-09 DIAGNOSIS — F411 Generalized anxiety disorder: Secondary | ICD-10-CM

## 2019-10-09 MED ORDER — ALPRAZOLAM 0.5 MG PO TABS: 0.5000 mg | ORAL_TABLET | Freq: Two times a day (BID) | ORAL | 1 refills | Status: DC | PRN

## 2019-10-09 NOTE — Telephone Encounter (Signed)
Pt advised we send med keep follow  Up appt

## 2019-10-26 ENCOUNTER — Telehealth (INDEPENDENT_AMBULATORY_CARE_PROVIDER_SITE_OTHER): Payer: PPO | Admitting: Psychiatry

## 2019-10-26 ENCOUNTER — Encounter (HOSPITAL_COMMUNITY): Payer: Self-pay | Admitting: Psychiatry

## 2019-10-26 ENCOUNTER — Other Ambulatory Visit: Payer: Self-pay

## 2019-10-26 DIAGNOSIS — Z630 Problems in relationship with spouse or partner: Secondary | ICD-10-CM

## 2019-10-26 DIAGNOSIS — F419 Anxiety disorder, unspecified: Secondary | ICD-10-CM

## 2019-10-26 DIAGNOSIS — F3341 Major depressive disorder, recurrent, in partial remission: Secondary | ICD-10-CM

## 2019-10-26 MED ORDER — VENLAFAXINE HCL ER 75 MG PO CP24: 75.0000 mg | ORAL_CAPSULE | Freq: Every day | ORAL | 1 refills | Status: DC
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
## 2019-11-03 ENCOUNTER — Ambulatory Visit (INDEPENDENT_AMBULATORY_CARE_PROVIDER_SITE_OTHER): Payer: PPO | Admitting: Nurse Practitioner

## 2019-11-03 ENCOUNTER — Encounter: Payer: Self-pay | Admitting: Nurse Practitioner

## 2019-11-03 VITALS — Resp 16 | Ht 66.0 in | Wt 235.0 lb

## 2019-11-03 DIAGNOSIS — R5383 Other fatigue: Secondary | ICD-10-CM | POA: Diagnosis not present

## 2019-11-03 DIAGNOSIS — E1165 Type 2 diabetes mellitus with hyperglycemia: Secondary | ICD-10-CM

## 2019-11-03 DIAGNOSIS — Z8616 Personal history of COVID-19: Secondary | ICD-10-CM | POA: Diagnosis not present

## 2019-11-03 DIAGNOSIS — I10 Essential (primary) hypertension: Secondary | ICD-10-CM
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
## 2019-11-04 ENCOUNTER — Other Ambulatory Visit: Payer: Self-pay

## 2019-11-04 DIAGNOSIS — Z8616 Personal history of COVID-19: Secondary | ICD-10-CM | POA: Insufficient documentation

## 2019-11-04 DIAGNOSIS — R5383 Other fatigue: Secondary | ICD-10-CM | POA: Insufficient documentation

## 2019-11-04 MED ORDER — LINZESS 145 MCG PO CAPS: ORAL_CAPSULE | ORAL | 3 refills | Status: DC

## 2019-11-30 ENCOUNTER — Other Ambulatory Visit: Payer: Self-pay

## 2019-11-30 DIAGNOSIS — B372 Candidiasis of skin and nail: Secondary | ICD-10-CM

## 2019-11-30 MED ORDER — NYSTATIN 100000 UNIT/GM EX POWD: Freq: Four times a day (QID) | CUTANEOUS | 0 refills | Status: DC

## 2019-12-14 ENCOUNTER — Telehealth (INDEPENDENT_AMBULATORY_CARE_PROVIDER_SITE_OTHER): Payer: PPO | Admitting: Psychiatry

## 2019-12-14 ENCOUNTER — Other Ambulatory Visit: Payer: Self-pay

## 2019-12-14 ENCOUNTER — Encounter (HOSPITAL_COMMUNITY): Payer: Self-pay | Admitting: Psychiatry

## 2019-12-14 DIAGNOSIS — F419 Anxiety disorder, unspecified: Secondary | ICD-10-CM

## 2019-12-14 DIAGNOSIS — F3341 Major depressive disorder, recurrent, in partial remission: Secondary | ICD-10-CM

## 2019-12-14 DIAGNOSIS — Z630 Problems in relationship with spouse or partner: Secondary | ICD-10-CM

## 2019-12-14 MED ORDER — VENLAFAXINE HCL ER 75 MG PO CP24: 75.0000 mg | ORAL_CAPSULE | Freq: Every day | ORAL | 1 refills | Status: DC
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
## 2019-12-15 ENCOUNTER — Ambulatory Visit: Payer: PPO

## 2019-12-17 ENCOUNTER — Ambulatory Visit: Payer: PPO | Admitting: Nurse Practitioner

## 2019-12-22 ENCOUNTER — Ambulatory Visit: Payer: PPO

## 2019-12-22 DIAGNOSIS — L570 Actinic keratosis: Secondary | ICD-10-CM | POA: Diagnosis not present

## 2019-12-22 DIAGNOSIS — L308 Other specified dermatitis: Secondary | ICD-10-CM | POA: Diagnosis not present

## 2019-12-22 DIAGNOSIS — D485 Neoplasm of uncertain behavior of skin: Secondary | ICD-10-CM | POA: Diagnosis not present

## 2020-01-11 ENCOUNTER — Other Ambulatory Visit: Payer: Self-pay

## 2020-01-11 ENCOUNTER — Encounter: Payer: Self-pay | Admitting: Nurse Practitioner

## 2020-01-11 ENCOUNTER — Ambulatory Visit (INDEPENDENT_AMBULATORY_CARE_PROVIDER_SITE_OTHER): Payer: PPO | Admitting: Nurse Practitioner

## 2020-01-11 VITALS — Ht 66.0 in | Wt 235.0 lb

## 2020-01-11 DIAGNOSIS — I10 Essential (primary) hypertension: Secondary | ICD-10-CM

## 2020-01-11 DIAGNOSIS — Z8616 Personal history of COVID-19: Secondary | ICD-10-CM | POA: Diagnosis not present

## 2020-01-11 DIAGNOSIS — E1165 Type 2 diabetes mellitus with hyperglycemia: Secondary | ICD-10-CM | POA: Diagnosis not present

## 2020-01-11 DIAGNOSIS — F331 Major depressive disorder, recurrent, moderate: Secondary | ICD-10-CM

## 2020-01-11 NOTE — Progress Notes (Signed)
[facility] [place], [place] [address]  Internal MEDICINE  Telephone Visit  Patient Name: Lisa Gutierrez  [id number]  [id number]  Date of Service: 01/11/2020  I connected with the patient at 1:11pm by telephone and verified the patients identity using two identifiers.   I discussed the limitations, risks, security and privacy concerns of performing an evaluation and management service by telephone and the availability of in person appointments. I also discussed with the patient that there may be a patient responsible charge related to the service.  The patient expressed understanding and agrees to proceed.    Chief Complaint  Patient presents with  . Telephone Screen  . Telephone Assessment  . Rash  . Diabetes  . Hypertension    The patient has been contacted via telephone for follow up visit due to concerns for spread of novel coronavirus. She is currently staying with her daughter in [place], [place] after she had surgery to repair a severe fractured ankle. She states that she is ready to come home. Knows she needs to be seen in the office to check HgbA1c. She states that her blood sugars are a bit higher since she had COVID 19 in January. Blood sugars are running between 120 and 145. She states that she has had rash with hives on her legs, trunk and arms. She has talked to her dermatologist about this. It is felt as though this is from having COVID 19 or having the vaccine for covid 19. She was given cream to use on hives. She states that this is gradually improving.  Has gone back to having feels of gloom and doom. Has been happening since she had COVID 19. She does not feel like her venlafaxine is working for her any longer. The patient does see psychiatrist. She has appointment scheduled in next few weeks. States that she can contact the provider earlier than appointment to discuss worsening of symptoms.       Current Medication: Outpatient Encounter  Medications as of 01/11/2020  Medication Sig Note  . acetaminophen (TYLENOL) 500 MG tablet Take 2 tablets by mouth as needed. 02/28/2016: Received from: [facility]: 1-2 tablets by mouth single dose as needed for pain  . acetic acid-hydrocortisone (VOSOL-HC) OTIC solution Place 3 drops into both ears 3 (three) times daily.   Marland Kitchen ALPRAZolam (XANAX) 0.5 MG tablet Take 1 tablet (0.5 mg total) by mouth 2 (two) times daily as needed for anxiety.   . Calcium Citrate-Vitamin D (CALCIUM + D PO) Take 1 tablet by mouth daily.   . Flaxseed, Linseed, (FLAXSEED OIL PO) Take 1,500 mg by mouth 2 (two) times daily.   . fluconazole (DIFLUCAN) 150 MG tablet Take 1  tab  Po once for yeast infection and repeat in 3 days if symptoms persist   . fluticasone (FLONASE) 50 MCG/ACT nasal spray Place 2 sprays into both nostrils daily as needed.    Marland Kitchen glucose blood (ACCU-CHEK SMARTVIEW) test strip 1 each by Other route as needed for other. Use as instructed   . hydrocortisone (PROCTOZONE-HC) 2.5 % rectal cream Place 1 application rectally 2 (two) times daily.   Marland Kitchen LINZESS 145 MCG CAPS capsule Take 1 tab po daily   . meclizine (ANTIVERT) 25 MG tablet Take 1 tablet (25 mg total) by mouth 2 (two) times daily as needed for dizziness.   . metoprolol succinate (TOPROL-XL) 25 MG 24 hr tablet Take 2 tablets (50 mg total) by mouth daily.   Marland Kitchen  Multiple Vitamins-Minerals (MULTIVITAMIN ADULT PO) Take 1 tablet by mouth daily.   Marland Kitchen nystatin (MYCOSTATIN/NYSTOP) powder Apply topically 4 (four) times daily.   . pantoprazole (PROTONIX) 40 MG tablet Take 1 tablet (40 mg total) by mouth 2 (two) times daily.   . polyethylene glycol powder (GLYCOLAX/MIRALAX) powder Take by mouth.   . pramoxine-hydrocortisone cream Apply topically 3 (three) times daily.   . sitaGLIPtin-metformin (JANUMET) 50-500 MG tablet Take 1 tablet by mouth 2 (two) times daily with a meal.   . Suvorexant (BELSOMRA) 10 MG TABS Take 10 mg by mouth at  bedtime as needed.   Marland Kitchen telmisartan-hydrochlorothiazide (MICARDIS HCT) 80-25 MG tablet Take 1 tablet by mouth daily.   Marland Kitchen UNABLE TO FIND Estriol 1 mg/g Use 1/4 app per vagina 1-2 times weekly Disp 30 g tube with 2 rf [facility] 01/03/2018: compound medication for vaginal use  . venlafaxine XR (EFFEXOR XR) 75 MG 24 hr capsule Take 1 capsule (75 mg total) by mouth daily with breakfast.    No facility-administered encounter medications on file as of 01/11/2020.    Surgical History: Past Surgical History:  Procedure Laterality Date  . BREAST EXCISIONAL BIOPSY    . CHOLECYSTECTOMY    . EXCISION / BIOPSY BREAST / NIPPLE / DUCT Right 1973   duct removed    Medical History: Past Medical History:  Diagnosis Date  . Anxiety   . Depression   . Diabetes mellitus without complication ([place])   . GERD (gastroesophageal reflux disease)   . Hypertension   . IBS (irritable bowel syndrome)   . Neuromuscular disorder ([place])     Family History: Family History  Problem Relation Age of Onset  . Diabetes Mother   . Hypertension Mother   . Coronary artery disease Father   . Glaucoma Father   . Breast cancer Neg Hx     Social History   Socioeconomic History  . Marital status: Married    Spouse name: Not on file  . Number of children: Not on file  . Years of education: Not on file  . Highest education level: Not on file  Occupational History  . Not on file  Tobacco Use  . Smoking status: Former Research scientist (life sciences)  . Smokeless tobacco: Never Used  Substance and Sexual Activity  . Alcohol use: No    Alcohol/week: 0.0 standard drinks  . Drug use: Never  . Sexual activity: Not on file  Other Topics Concern  . Not on file  Social History Narrative  . Not on file   Social Determinants of Health   Financial Resource Strain:   . Difficulty of Paying Living Expenses: Not on file  Food Insecurity:   . Worried About Charity fundraiser in the Last Year: Not on file  . Ran Out of Food in the  Last Year: Not on file  Transportation Needs:   . Lack of Transportation (Medical): Not on file  . Lack of Transportation (Non-Medical): Not on file  Physical Activity:   . Days of Exercise per Week: Not on file  . Minutes of Exercise per Session: Not on file  Stress:   . Feeling of Stress : Not on file  Social Connections:   . Frequency of Communication with Friends and Family: Not on file  . Frequency of Social Gatherings with Friends and Family: Not on file  . Attends Religious Services: Not on file  . Active Member of Clubs or Organizations: Not on file  . Attends Club or  Organization Meetings: Not on file  . Marital Status: Not on file  Intimate Partner Violence:   . Fear of Current or Ex-Partner: Not on file  . Emotionally Abused: Not on file  . Physically Abused: Not on file  . Sexually Abused: Not on file      Review of Systems  Constitutional: Negative for chills and fatigue.  HENT: Negative for congestion, ear pain, postnasal drip, rhinorrhea, sinus pain, sore throat and voice change.   Respiratory: Negative for cough and wheezing.   Cardiovascular: Negative for chest pain and palpitations.  Gastrointestinal: Positive for nausea. Negative for constipation, diarrhea and vomiting.  Endocrine: Negative for cold intolerance, heat intolerance, polydipsia and polyuria.       Blood sugars running between 120 and 145 in the mornings.   Musculoskeletal: Positive for arthralgias and myalgias.  Skin: Positive for rash.  Allergic/Immunologic: Positive for environmental allergies.  Neurological: Positive for headaches.  Psychiatric/Behavioral: Positive for dysphoric mood. The patient is nervous/anxious.     Today's Vitals   01/11/20 1119  Weight: 235 lb (106.6 kg)  Height: 5\' 6"  (1.676 m)   Body mass index is 37.93 kg/m.  Observation/Objective:   The patient is alert and oriented. She is pleasant and answers all questions appropriately. Breathing is non-labored. She is  in no acute distress at this time.    Assessment/Plan: 1. Type 2 diabetes mellitus with hyperglycemia, without long-term current use of insulin (HCC) No changes made to diabetic medication today. Will check HgbA1c at next, in-office visit.   2. Essential hypertension No changes to BP medication.  3. MDD (major depressive disorder), recurrent episode, moderate ([place]) Recommend she discuss recurent symptoms with psychiatry.   4. History of COVID-19 Patient having some long-term symptoms, including "brain fog." will continue to monitor.   General Counseling: tarica harl understanding of the findings of today's phone visit and agrees with plan of treatment. I have discussed any further diagnostic evaluation that may be needed or ordered today. We also reviewed her medications today. she has been encouraged to call the office with any questions or concerns that should arise related to todays visit.   This patient was seen by [name] FNP Collaboration with Dr Lavera Guise as a part of collaborative care agreement   Time spent: 45 Minutes    Dr Lavera Guise Internal medicine

## 2020-01-13 DIAGNOSIS — D485 Neoplasm of uncertain behavior of skin: Secondary | ICD-10-CM | POA: Diagnosis not present

## 2020-01-21 DIAGNOSIS — E119 Type 2 diabetes mellitus without complications: Secondary | ICD-10-CM | POA: Diagnosis not present

## 2020-01-25 ENCOUNTER — Telehealth (INDEPENDENT_AMBULATORY_CARE_PROVIDER_SITE_OTHER): Payer: PPO | Admitting: Psychiatry

## 2020-01-25 ENCOUNTER — Other Ambulatory Visit: Payer: Self-pay

## 2020-01-25 ENCOUNTER — Encounter (HOSPITAL_COMMUNITY): Payer: Self-pay | Admitting: Psychiatry

## 2020-01-25 DIAGNOSIS — Z630 Problems in relationship with spouse or partner: Secondary | ICD-10-CM | POA: Diagnosis not present

## 2020-01-25 DIAGNOSIS — F3341 Major depressive disorder, recurrent, in partial remission: Secondary | ICD-10-CM | POA: Diagnosis not present

## 2020-01-25 DIAGNOSIS — F419 Anxiety disorder, unspecified: Secondary | ICD-10-CM | POA: Diagnosis not present

## 2020-01-25 NOTE — Progress Notes (Signed)
[facility] MD OP Progress Note  Virtual Visit via Video Note  I connected with Lisa Gutierrez on 01/25/20 at 11:40 AM EDT by a video enabled telemedicine application and verified that I am speaking with the correct person using two identifiers.  Location: Patient: Home Provider: Clinic   I discussed the limitations of evaluation and management by telemedicine and the availability of in person appointments. The patient expressed understanding and agreed to proceed.  I provided 16 minutes of non-face-to-face time during this encounter.   01/25/2020 11:54 AM Lisa Gutierrez  MRN:  [id number]  Chief Complaint:  " I want to feel happy again."  HPI: Patient informed that she still in [place], [place] with her son and daughter-in-law.  Patient informed that she is hoping to be back home by end of this month.  She still helping her daughter-in-law, she informed that her fractured leg is healing and she is doing better now. She informed that her husband visited them recently and initially behaved well however after a few interactions he returned back to being himself.  She stated that he continues to demean her and does not respect her. She said that she is tired of him behaving this way with her.  She also informed that her sister called a few days ago and apologized for her previous behavior.  And then she asked if the patient will bringing her barbecue and chips. She stated that she is tired of people being so mean to her and then expecting so much out of her.  She once again mentioned that the medication makes her sweat and she does not know if the medicine is the best option for her.  Writer once again reminded her that she has been through so in her medications that may be the options are quite limited now. She then asked if she could need to see a counselor because she did not know that she could also talk to someone else because she thinks she needs another insight. Writer was  agreeable to this and informed her that the office of staff will contact her to schedule an appointment with a therapist.   Visit Diagnosis:    ICD-10-CM   1. MDD (major depressive disorder), recurrent, in partial remission ([place])  F33.41   2. Anxiety  F41.9   3. Marital relationship problem  Z63.0     Past Psychiatric History: depression, anxiety  Past Medical History:  Past Medical History:  Diagnosis Date  . Anxiety   . Depression   . Diabetes mellitus without complication ([place])   . GERD (gastroesophageal reflux disease)   . Hypertension   . IBS (irritable bowel syndrome)   . Neuromuscular disorder [facility])     Past Surgical History:  Procedure Laterality Date  . BREAST EXCISIONAL BIOPSY    . CHOLECYSTECTOMY    . EXCISION / BIOPSY BREAST / NIPPLE / DUCT Right 1973   duct removed    Family Psychiatric History: denied  Family History:  Family History  Problem Relation Age of Onset  . Diabetes Mother   . Hypertension Mother   . Coronary artery disease Father   . Glaucoma Father   . Breast cancer Neg Hx     Social History:  Social History   Socioeconomic History  . Marital status: Married    Spouse name: Not on file  . Number of children: Not on file  . Years of education: Not on file  . Highest education level: Not on file  Occupational History  . Not on file  Tobacco Use  . Smoking status: Former Research scientist (life sciences)  . Smokeless tobacco: Never Used  Substance and Sexual Activity  . Alcohol use: No    Alcohol/week: 0.0 standard drinks  . Drug use: Never  . Sexual activity: Not on file  Other Topics Concern  . Not on file  Social History Narrative  . Not on file   Social Determinants of Health   Financial Resource Strain:   . Difficulty of Paying Living Expenses: Not on file  Food Insecurity:   . Worried About Charity fundraiser in the Last Year: Not on file  . Ran Out of Food in the Last Year: Not on file  Transportation Needs:   . Lack of Transportation  (Medical): Not on file  . Lack of Transportation (Non-Medical): Not on file  Physical Activity:   . Days of Exercise per Week: Not on file  . Minutes of Exercise per Session: Not on file  Stress:   . Feeling of Stress : Not on file  Social Connections:   . Frequency of Communication with Friends and Family: Not on file  . Frequency of Social Gatherings with Friends and Family: Not on file  . Attends Religious Services: Not on file  . Active Member of Clubs or Organizations: Not on file  . Attends Archivist Meetings: Not on file  . Marital Status: Not on file    Allergies:  Allergies  Allergen Reactions  . Aspirin Other (See Comments)    GI upset  . Ibuprofen Other (See Comments)    GERD  . Levofloxacin Other (See Comments)    Weight gain  . Meloxicam Other (See Comments)    GI upset  . Morphine Other (See Comments)  . Naprosyn  [Naproxen] Other (See Comments)    GI upset  . Nsaids Other (See Comments)    GI upset  . Quinolones   . Robinul  [Glycopyrrolate] Nausea And Vomiting  . Penicillin V Potassium Rash  . Sulfa Antibiotics Rash    Metabolic Disorder Labs: Lab Results  Component Value Date   HGBA1C 6.2 (A) 02/10/2019   No results found for: PROLACTIN No results found for: CHOL, TRIG, HDL, CHOLHDL, VLDL, LDLCALC No results found for: TSH  Therapeutic Level Labs: No results found for: LITHIUM No results found for: VALPROATE No components found for:  CBMZ  Current Medications: Current Outpatient Medications  Medication Sig Dispense Refill  . acetaminophen (TYLENOL) 500 MG tablet Take 2 tablets by mouth as needed.    Marland Kitchen acetic acid-hydrocortisone (VOSOL-HC) OTIC solution Place 3 drops into both ears 3 (three) times daily. 10 mL 0  . ALPRAZolam (XANAX) 0.5 MG tablet Take 1 tablet (0.5 mg total) by mouth 2 (two) times daily as needed for anxiety. 60 tablet 1  . Calcium Citrate-Vitamin D (CALCIUM + D PO) Take 1 tablet by mouth daily.    . Flaxseed,  Linseed, (FLAXSEED OIL PO) Take 1,500 mg by mouth 2 (two) times daily.    . fluconazole (DIFLUCAN) 150 MG tablet Take 1  tab  Po once for yeast infection and repeat in 3 days if symptoms persist 3 tablet 0  . fluticasone (FLONASE) 50 MCG/ACT nasal spray Place 2 sprays into both nostrils daily as needed.     Marland Kitchen glucose blood (ACCU-CHEK SMARTVIEW) test strip 1 each by Other route as needed for other. Use as instructed 200 each 11  . hydrocortisone (PROCTOZONE-HC) 2.5 % rectal cream  Place 1 application rectally 2 (two) times daily. 30 g 0  . LINZESS 145 MCG CAPS capsule Take 1 tab po daily 30 capsule 3  . meclizine (ANTIVERT) 25 MG tablet Take 1 tablet (25 mg total) by mouth 2 (two) times daily as needed for dizziness. 60 tablet 1  . metoprolol succinate (TOPROL-XL) 25 MG 24 hr tablet Take 2 tablets (50 mg total) by mouth daily. 180 tablet 0  . Multiple Vitamins-Minerals (MULTIVITAMIN ADULT PO) Take 1 tablet by mouth daily.    Marland Kitchen nystatin (MYCOSTATIN/NYSTOP) powder Apply topically 4 (four) times daily. 60 g 0  . pantoprazole (PROTONIX) 40 MG tablet Take 1 tablet (40 mg total) by mouth 2 (two) times daily. 180 tablet 3  . polyethylene glycol powder (GLYCOLAX/MIRALAX) powder Take by mouth.    . pramoxine-hydrocortisone cream Apply topically 3 (three) times daily. 57 g 0  . sitaGLIPtin-metformin (JANUMET) 50-500 MG tablet Take 1 tablet by mouth 2 (two) times daily with a meal. 90 tablet 4  . Suvorexant (BELSOMRA) 10 MG TABS Take 10 mg by mouth at bedtime as needed. 30 tablet 3  . telmisartan-hydrochlorothiazide (MICARDIS HCT) 80-25 MG tablet Take 1 tablet by mouth daily. 90 tablet 1  . UNABLE TO FIND Estriol 1 mg/g Use 1/4 app per vagina 1-2 times weekly Disp 30 g tube with 2 rf Called to [facility]    . venlafaxine XR (EFFEXOR XR) 75 MG 24 hr capsule Take 1 capsule (75 mg total) by mouth daily with breakfast. 30 capsule 1   No current facility-administered medications for this visit.      Musculoskeletal: Strength & Muscle Tone: unable to assess due to telemed visit Gait & Station: unable to assess due to telemed visit Patient leans: unable to assess due to telemed visit  Psychiatric Specialty Exam: ROS  There were no vitals taken for this visit.There is no height or weight on file to calculate BMI.  General Appearance: Fairly groomed  Eye Contact: Good  Speech:  Clear and Coherent and Normal Rate  Volume:  Normal  Mood: anxious  Affect:  Congruent  Thought Process:  Goal Directed, Linear and Descriptions of Associations: Intact  Orientation:  Full (Time, Place, and Person)  Thought Content: Logical and Rumination   Suicidal Thoughts:  No  Homicidal Thoughts:  No  Memory:  Recent;   Good Remote;   Good  Judgement:  Fair  Insight:  Fair  Psychomotor Activity:  Normal  Concentration:  Concentration: Good and Attention Span: Good  Recall:  Good  Fund of Knowledge: Good  Language: Good  Akathisia:  Negative  Handed:  Right  AIMS (if indicated): not done  Assets:  Communication Skills Desire for Improvement Financial Resources/Insurance Housing  ADL's:  Intact  Cognition: WNL  Sleep:  Fair   Screenings: Mini-Mental     Clinical Support from 02/06/2018 in [facility], [facility]  Total Score (max 30 points ) 30    PHQ2-9     Office Visit from 01/11/2020 in [facility], [facility] Office Visit from 11/03/2019 in [facility], [facility] Office Visit from 03/17/2019 in [facility], [place] from 02/10/2019 in [facility], [facility] Office Visit from 12/15/2018 in [facility], [facility]  PHQ-2 Total Score 1 0 0 6 6  PHQ-9 Total Score -- -- 0 24 24       Assessment and Plan: Patient continues to deal with conflictual personal relationships.  She now wants to speak to a counselor in  addition to the writer requested an appointment for the same.  1. MDD (major depressive disorder), recurrent, in  partial remission ([facility])  - venlafaxine XR (EFFEXOR XR) 75 MG 24 hr capsule; Take 1 capsule (75 mg total) by mouth daily with breakfast.  Dispense: 30 capsule; Refill: 1  2. Anxiety  - venlafaxine XR (EFFEXOR XR) 75 MG 24 hr capsule; Take 1 capsule (75 mg total) by mouth daily with breakfast.  Dispense: 30 capsule; Refill: 1  3. Marital relationship problem    Much supportive therapy provided during the session. Will refer to a counselor as per the patient request. F/up in 8 weeks.   [place] Crane, MD 01/25/2020, 11:54 AM

## 2020-01-26 ENCOUNTER — Other Ambulatory Visit: Payer: Self-pay

## 2020-01-26 ENCOUNTER — Ambulatory Visit (INDEPENDENT_AMBULATORY_CARE_PROVIDER_SITE_OTHER): Payer: PPO | Admitting: Licensed Clinical Social Worker

## 2020-01-26 DIAGNOSIS — F419 Anxiety disorder, unspecified: Secondary | ICD-10-CM

## 2020-01-26 DIAGNOSIS — F331 Major depressive disorder, recurrent, moderate: Secondary | ICD-10-CM | POA: Diagnosis not present

## 2020-01-26 NOTE — Progress Notes (Signed)
Virtual Visit via Video Note  I connected with Lisa Gutierrez on 01/26/20 at 10:00 AM EDT by a video enabled telemedicine application and verified that I am speaking with the correct person using two identifiers.  Location: Patient: home Provider: [facility]   I discussed the limitations of evaluation and management by telemedicine and the availability of in person appointments. The patient expressed understanding and agreed to proceed.   I discussed the assessment and treatment plan with the patient. The patient was provided an opportunity to ask questions and all were answered. The patient agreed with the plan and demonstrated an understanding of the instructions.   The patient was advised to call back or seek an in-person evaluation if the symptoms worsen or if the condition fails to improve as anticipated.  I provided 60 minutes of non-face-to-face time during this encounter.   Terance Pomplun R Thursa Emme, LCSW    THERAPIST PROGRESS NOTE  Session Time: 10:00-11:00a  Participation Level: Active  Behavioral Response: NeatAlertDepressed  Type of Therapy: Individual Therapy  Treatment Goals addressed: Coping  Interventions: Solution Focused, Supportive and Family Systems  Summary: Lisa Gutierrez is a 69 y.o. female who presents with symptoms related to diagnosis of depression. Pt reports low mood, crying frequently, and poor quality sleep.  Allowed pt to explore and express thoughts and feelings about current external stressors and family relationships. Pt shared information about current marital relationship and how unsatisfying it is currently.    Pt disclosed that she lost a friend to suicide yesterday--allowed pt to process through the whirlwind of emotions she is currently experiencing.  Processed through lots of traumas from past year: losing pets, covid, relationships with family members. Pt very upset throughout session.    Developed "happy place" guided imagery  and session ended on a positive note.  Discussed importance of setting boundaries with family members, identifying wants/needs within self, and communicating needs to others instead of expecting them to just "know what to do". Will continue working on communication skills, conflict resolution, compromise, and setting and abiding to personal boundaries with loved ones.   Encouraged overall self care and life balance.  Suicidal/Homicidal: No  Therapist Response: Developed treatment plan  Plan: Return again in 2 weeks. The ongoing treatment plan includes maintaining current levels of progress and continuing to build skills to manage mood, improve stress/anxiety management, emotion regulation, distress tolerance, and behavior modification.   Diagnosis: Axis I: Major depressive disorder, recurrent, moderate; Generalized anxiety disorder    Axis II: No diagnosis    Rachel Bo Keidrick Murty, LCSW 01/26/2020

## 2020-01-30 ENCOUNTER — Ambulatory Visit: Payer: PPO | Attending: Internal Medicine

## 2020-01-30 DIAGNOSIS — Z23 Encounter for immunization: Secondary | ICD-10-CM

## 2020-01-30 NOTE — Progress Notes (Signed)
   Covid-19 Vaccination Clinic  Name:  Lisa Gutierrez    MRN: [id number] DOB: 06/26/1950  01/30/2020  Lisa Gutierrez was observed post Covid-19 immunization for 15 minutes without incident. She was provided with Vaccine Information Sheet and instruction to access the V-Safe system.   Lisa Gutierrez was instructed to call 911 with any severe reactions post vaccine: Marland Kitchen Difficulty breathing  . Swelling of face and throat  . A fast heartbeat  . A bad rash all over body  . Dizziness and weakness

## 2020-02-01 ENCOUNTER — Telehealth: Payer: Self-pay

## 2020-02-01 ENCOUNTER — Other Ambulatory Visit: Payer: Self-pay | Admitting: Nurse Practitioner

## 2020-02-01 ENCOUNTER — Other Ambulatory Visit: Payer: Self-pay

## 2020-02-01 DIAGNOSIS — F411 Generalized anxiety disorder: Secondary | ICD-10-CM

## 2020-02-01 DIAGNOSIS — I10 Essential (primary) hypertension: Secondary | ICD-10-CM

## 2020-02-01 MED ORDER — ALPRAZOLAM 0.5 MG PO TABS: 0.5000 mg | ORAL_TABLET | Freq: Two times a day (BID) | ORAL | 0 refills | Status: DC | PRN

## 2020-02-01 MED ORDER — METOPROLOL SUCCINATE ER 25 MG PO TB24: 50.0000 mg | ORAL_TABLET | Freq: Every day | ORAL | 0 refills | Status: DC

## 2020-02-01 NOTE — Progress Notes (Signed)
A single thirty day prescription for alprazolam was sent to the patient's pharmacy.

## 2020-02-01 NOTE — Telephone Encounter (Signed)
A single thirty day prescription for alprazolam was sent to the patient's pharmacy.

## 2020-02-04 DIAGNOSIS — L309 Dermatitis, unspecified: Secondary | ICD-10-CM | POA: Diagnosis not present

## 2020-02-04 DIAGNOSIS — L578 Other skin changes due to chronic exposure to nonionizing radiation: Secondary | ICD-10-CM | POA: Diagnosis not present

## 2020-02-04 DIAGNOSIS — L570 Actinic keratosis: Secondary | ICD-10-CM | POA: Diagnosis not present

## 2020-02-12 ENCOUNTER — Other Ambulatory Visit: Payer: Self-pay

## 2020-02-12 ENCOUNTER — Ambulatory Visit: Payer: PPO | Admitting: Licensed Clinical Social Worker

## 2020-02-12 ENCOUNTER — Ambulatory Visit (INDEPENDENT_AMBULATORY_CARE_PROVIDER_SITE_OTHER): Payer: PPO | Admitting: Nurse Practitioner

## 2020-02-12 ENCOUNTER — Encounter: Payer: Self-pay | Admitting: Nurse Practitioner

## 2020-02-12 VITALS — BP 138/90 | HR 100 | Temp 98.0°F | Resp 16 | Ht 66.0 in | Wt 236.6 lb

## 2020-02-12 DIAGNOSIS — Z789 Other specified health status: Secondary | ICD-10-CM

## 2020-02-12 DIAGNOSIS — N390 Urinary tract infection, site not specified: Secondary | ICD-10-CM

## 2020-02-12 DIAGNOSIS — F3341 Major depressive disorder, recurrent, in partial remission: Secondary | ICD-10-CM

## 2020-02-12 DIAGNOSIS — E1165 Type 2 diabetes mellitus with hyperglycemia: Secondary | ICD-10-CM

## 2020-02-12 DIAGNOSIS — B372 Candidiasis of skin and nail: Secondary | ICD-10-CM | POA: Diagnosis not present

## 2020-02-12 DIAGNOSIS — Z0001 Encounter for general adult medical examination with abnormal findings: Secondary | ICD-10-CM | POA: Diagnosis not present

## 2020-02-12 DIAGNOSIS — I10 Essential (primary) hypertension: Secondary | ICD-10-CM | POA: Diagnosis not present

## 2020-02-12 LAB — POCT GLYCOSYLATED HEMOGLOBIN (HGB A1C): Hemoglobin A1C: 6.6 % — AB (ref 4.0–5.6)

## 2020-02-12 MED ORDER — NYSTATIN 100000 UNIT/GM EX POWD: Freq: Two times a day (BID) | CUTANEOUS | 0 refills | Status: DC

## 2020-02-12 MED ORDER — NITROFURANTOIN MONOHYD MACRO 100 MG PO CAPS: 100.0000 mg | ORAL_CAPSULE | Freq: Two times a day (BID) | ORAL | 0 refills | Status: DC

## 2020-02-12 NOTE — Progress Notes (Signed)
[facility] [place], [place] [address]  Internal MEDICINE  Office Visit Note  Patient Name: Lisa Gutierrez  [id number]  [id number]  Date of Service: 03/06/2020   Pt is here for routine health maintenance examination  Chief Complaint  Patient presents with  . Medicare Wellness    med side effects  . Anxiety  . Depression    discuss depression  . Diabetes  . Hypertension  . policy update form    reviewed     The patient presents for health maintenance exam. She has been staying in [place] with her daughter for some time, enjoying the adoption of her new granddaughter. Her blood sugars are well managed. Her HgbA1c is 6.6 today. She is now seeing a Museum/gallery conservator. She states this is going very well. She is due to have routine, fasting labs. She feels as though she may have urinary tract infection. She has urinary frequency and some vaginal discomfort. She does get yeast infections frequencly.     Current Medication: Outpatient Encounter Medications as of 02/12/2020  Medication Sig Note  . acetaminophen (TYLENOL) 500 MG tablet Take 2 tablets by mouth as needed. 02/28/2016: Received from: [facility]: 1-2 tablets by mouth single dose as needed for pain  . acetic acid-hydrocortisone (VOSOL-HC) OTIC solution Place 3 drops into both ears 3 (three) times daily.   Marland Kitchen ALPRAZolam (XANAX) 0.5 MG tablet Take 1 tablet (0.5 mg total) by mouth 2 (two) times daily as needed for anxiety.   . Calcium Citrate-Vitamin D (CALCIUM + D PO) Take 1 tablet by mouth daily.   . Flaxseed, Linseed, (FLAXSEED OIL PO) Take 1,500 mg by mouth 2 (two) times daily.   . fluconazole (DIFLUCAN) 150 MG tablet Take 1  tab  Po once for yeast infection and repeat in 3 days if symptoms persist   . glucose blood (ACCU-CHEK SMARTVIEW) test strip 1 each by Other route as needed for other. Use as instructed   . hydrocortisone (PROCTOZONE-HC) 2.5 %  rectal cream Place 1 application rectally 2 (two) times daily.   Marland Kitchen LINZESS 145 MCG CAPS capsule Take 1 tab po daily   . meclizine (ANTIVERT) 25 MG tablet Take 1 tablet (25 mg total) by mouth 2 (two) times daily as needed for dizziness.   . metoprolol succinate (TOPROL-XL) 25 MG 24 hr tablet Take 2 tablets (50 mg total) by mouth daily.   . Multiple Vitamins-Minerals (MULTIVITAMIN ADULT PO) Take 1 tablet by mouth daily.   Marland Kitchen nystatin (MYCOSTATIN/NYSTOP) powder Apply topically 2 (two) times daily.   . pantoprazole (PROTONIX) 40 MG tablet Take 1 tablet (40 mg total) by mouth 2 (two) times daily.   . sitaGLIPtin-metformin (JANUMET) 50-500 MG tablet Take 1 tablet by mouth 2 (two) times daily with a meal.   . venlafaxine XR (EFFEXOR XR) 75 MG 24 hr capsule Take 1 capsule (75 mg total) by mouth daily with breakfast.   . [DISCONTINUED] nystatin (MYCOSTATIN/NYSTOP) powder Apply topically 4 (four) times daily.   . [DISCONTINUED] telmisartan-hydrochlorothiazide (MICARDIS HCT) 80-25 MG tablet Take 1 tablet by mouth daily.   . nitrofurantoin, macrocrystal-monohydrate, (MACROBID) 100 MG capsule Take 1 capsule (100 mg total) by mouth 2 (two) times daily.   . polyethylene glycol powder (GLYCOLAX/MIRALAX) powder Take by mouth. (Patient not taking: Reported on 02/12/2020)   . [DISCONTINUED] fluticasone (FLONASE) 50 MCG/ACT nasal spray Place 2 sprays into both nostrils daily as needed.    . [DISCONTINUED] pramoxine-hydrocortisone cream  Apply topically 3 (three) times daily.   . [DISCONTINUED] Suvorexant (BELSOMRA) 10 MG TABS Take 10 mg by mouth at bedtime as needed. (Patient not taking: Reported on 02/12/2020)   . [DISCONTINUED] UNABLE TO FIND Estriol 1 mg/g Use 1/4 app per vagina 1-2 times weekly Disp 30 g tube with 2 rf Called to [facility] 01/03/2018: compound medication for vaginal use   No facility-administered encounter medications on file as of 02/12/2020.    Surgical History: Past Surgical History:   Procedure Laterality Date  . BREAST EXCISIONAL BIOPSY    . CHOLECYSTECTOMY    . EXCISION / BIOPSY BREAST / NIPPLE / DUCT Right 1973   duct removed    Medical History: Past Medical History:  Diagnosis Date  . Anxiety   . Depression   . Diabetes mellitus without complication ([place])   . GERD (gastroesophageal reflux disease)   . Hypertension   . IBS (irritable bowel syndrome)   . Neuromuscular disorder ([place])     Family History: Family History  Problem Relation Age of Onset  . Diabetes Mother   . Hypertension Mother   . Coronary artery disease Father   . Glaucoma Father   . Breast cancer Neg Hx       Review of Systems  Constitutional: Positive for fatigue. Negative for activity change and chills.  HENT: Negative for congestion, ear pain, postnasal drip, rhinorrhea, sinus pain, sore throat and voice change.   Respiratory: Negative for cough and wheezing.   Cardiovascular: Negative for chest pain and palpitations.  Gastrointestinal: Negative for constipation, diarrhea, nausea and vomiting.  Endocrine: Negative for cold intolerance, heat intolerance, polydipsia and polyuria.       Blood sugars doing well   Genitourinary: Positive for frequency and urgency.  Musculoskeletal: Negative for arthralgias and myalgias.  Skin: Negative for rash.  Allergic/Immunologic: Positive for environmental allergies.  Neurological: Positive for headaches.  Psychiatric/Behavioral: Positive for dysphoric mood. The patient is nervous/anxious.        Patient seeing psychiatrist and counselor      Today's Vitals   02/12/20 1144  BP: 138/90  Pulse: 100  Resp: 16  Temp: 98 F (36.7 C)  SpO2: 95%  Weight: 236 lb 9.6 oz (107.3 kg)  Height: 5\' 6"  (1.676 m)   Body mass index is 38.19 kg/m.  Physical Exam Vitals and nursing note reviewed.  Constitutional:      General: She is not in acute distress.    Appearance: Normal appearance. She is well-developed. She is obese. She is not  diaphoretic.  HENT:     Head: Normocephalic and atraumatic.     Nose: Nose normal.     Mouth/Throat:     Pharynx: No oropharyngeal exudate.  Eyes:     Pupils: Pupils are equal, round, and reactive to light.  Neck:     Thyroid: No thyromegaly.     Vascular: No carotid bruit or JVD.     Trachea: No tracheal deviation.  Cardiovascular:     Rate and Rhythm: Normal rate and regular rhythm.     Pulses:          Dorsalis pedis pulses are 1+ on the right side and 1+ on the left side.       Posterior tibial pulses are 1+ on the right side and 1+ on the left side.     Heart sounds: Normal heart sounds. No murmur heard.  No friction rub. No gallop.   Pulmonary:     Effort: Pulmonary effort is  normal. No respiratory distress.     Breath sounds: Normal breath sounds. No wheezing or rales.  Chest:     Chest wall: No tenderness.  Abdominal:     General: Bowel sounds are normal.     Palpations: Abdomen is soft.     Tenderness: There is no abdominal tenderness.  Musculoskeletal:        General: Normal range of motion.     Cervical back: Normal range of motion and neck supple.     Right foot: Normal range of motion. No deformity or bunion.     Left foot: Normal range of motion. No deformity or bunion.  Feet:     Right foot:     Protective Sensation: 10 sites tested. 10 sites sensed.     Skin integrity: Skin integrity normal.     Toenail Condition: Right toenails are normal.     Left foot:     Protective Sensation: 10 sites tested. 10 sites sensed.     Skin integrity: Skin integrity normal.     Toenail Condition: Left toenails are normal.  Lymphadenopathy:     Cervical: No cervical adenopathy.  Skin:    General: Skin is warm and dry.     Capillary Refill: Capillary refill takes 2 to 3 seconds.  Neurological:     General: No focal deficit present.     Mental Status: She is alert and oriented to person, place, and time.     Cranial Nerves: No cranial nerve deficit.  Psychiatric:         Attention and Perception: Attention and perception normal.        Mood and Affect: Mood is anxious and depressed.        Speech: Speech normal.        Behavior: Behavior normal. Behavior is cooperative.        Thought Content: Thought content normal.        Cognition and Memory: Cognition normal.        Judgment: Judgment normal.    Depression screen [facility] 2/9 02/12/2020 01/11/2020 11/03/2019 03/18/2019 02/10/2019  Decreased Interest 2 1 0 0 3  Down, Depressed, Hopeless 1 0 0 0 3  PHQ - 2 Score 3 1 0 0 6  Altered sleeping 1 - - 0 3  Tired, decreased energy 1 - - 0 3  Change in appetite 1 - - 0 3  Feeling bad or failure about yourself  3 - - 0 3  Trouble concentrating 3 - - 0 3  Moving slowly or fidgety/restless 2 - - 0 3  Suicidal thoughts 1 - - 0 0  PHQ-9 Score 15 - - 0 24  Difficult doing work/chores - - - - Very difficult  Some recent data might be hidden    Functional Status Survey: Is the patient deaf or have difficulty hearing?: Yes Does the patient have difficulty seeing, even when wearing glasses/contacts?: Yes Does the patient have difficulty concentrating, remembering, or making decisions?: Yes Does the patient have difficulty walking or climbing stairs?: Yes (unstable since covid) Does the patient have difficulty dressing or bathing?: No Does the patient have difficulty doing errands alone such as visiting a doctor's office or shopping?: No  MMSE - [place] Exam 02/12/2020 02/06/2018  Orientation to time 5 5  Orientation to Place 5 5  Registration 3 3  Attention/ Calculation 5 5  Recall 3 3  Language- name 2 objects 2 2  Language- repeat 1 1  Language-  follow 3 step command 3 3  Language- read & follow direction 1 1  Write a sentence 0 1  Copy design 1 1  Total score 29 30    Fall Risk  02/12/2020 01/11/2020 11/03/2019 03/18/2019 02/10/2019  Falls in the past year? 0 0 0 0 0  Number falls in past yr: - - - - -  Injury with Fall? - - - - -  Comment - - -  - -  Risk for fall due to : No Fall Risks - - - -  Follow up Falls evaluation completed Falls evaluation completed - - -      LABS: Recent Results (from the past 2160 hour(s))  POCT glycosylated hemoglobin (Hb A1C)     Status: Abnormal   Collection Time: 02/12/20 12:53 PM  Result Value Ref Range   Hemoglobin A1C 6.6 (A) 4.0 - 5.6 %   HbA1c POC (<> result, manual entry)     HbA1c, POC (prediabetic range)     HbA1c, POC (controlled diabetic range)      .Assessment/Plan: 1. Encounter for general adult medical examination with abnormal findings Annual health maintenance exam today. Order slip given for routine, fasting labs.   2. Type 2 diabetes mellitus with hyperglycemia, without long-term current use of insulin (HCC) - POCT glycosylated hemoglobin (Hb A1C) 6.6 today. Continue diabetic medication as prescribed   3. Urinary tract infection without hematuria, site unspecified Start macrobid 100 mg twice daily for 7 days. Patient unable to provide urine sample today. Will return for u/a if symptoms are persistent.  - nitrofurantoin, macrocrystal-monohydrate, (MACROBID) 100 MG capsule; Take 1 capsule (100 mg total) by mouth 2 (two) times daily.  Dispense: 14 capsule; Refill: 0  4. Cutaneous candidiasis Apply nystatin powder to effected areas twice daily as needed.  - nystatin (MYCOSTATIN/NYSTOP) powder; Apply topically 2 (two) times daily.  Dispense: 60 g; Refill: 0  5. Essential hypertension Stable. Continue bp medication as prescribed   6. Statin intolerance Check fasting lipipd panel trial of zetia if indicated by high lipid panel.   7. MDD (major depressive disorder), recurrent, in partial remission ([place]) Continue regular visits with psychiatry and counselor as scheduled.   General Counseling: kiely cousar understanding of the findings of todays visit and agrees with plan of treatment. I have discussed any further diagnostic evaluation that may be needed or ordered today.  We also reviewed her medications today. she has been encouraged to call the office with any questions or concerns that should arise related to todays visit.    Counseling:  This patient was seen by [name] FNP Collaboration with Dr Lavera Guise as a part of collaborative care agreement  Orders Placed This Encounter  Procedures  . POCT glycosylated hemoglobin (Hb A1C)    Meds ordered this encounter  Medications  . nitrofurantoin, macrocrystal-monohydrate, (MACROBID) 100 MG capsule    Sig: Take 1 capsule (100 mg total) by mouth 2 (two) times daily.    Dispense:  14 capsule    Refill:  0    Order Specific Question:   Supervising Provider    Answer:   Lavera Guise [[id number]]  . nystatin (MYCOSTATIN/NYSTOP) powder    Sig: Apply topically 2 (two) times daily.    Dispense:  60 g    Refill:  0    Order Specific Question:   Supervising Provider    Answer:   Lavera Guise [[id number]]    Total time spent: 45 Minutes  Time spent  includes review of chart, medications, test results, and follow up plan with the patient.     Lavera Guise, MD  Internal Medicine

## 2020-02-24 ENCOUNTER — Other Ambulatory Visit: Payer: Self-pay

## 2020-02-24 DIAGNOSIS — I10 Essential (primary) hypertension: Secondary | ICD-10-CM

## 2020-02-24 MED ORDER — TELMISARTAN-HCTZ 80-25 MG PO TABS: 1.0000 | ORAL_TABLET | Freq: Every day | ORAL | 1 refills | Status: DC

## 2020-02-25 ENCOUNTER — Ambulatory Visit (INDEPENDENT_AMBULATORY_CARE_PROVIDER_SITE_OTHER): Payer: PPO | Admitting: Licensed Clinical Social Worker

## 2020-02-25 ENCOUNTER — Other Ambulatory Visit: Payer: Self-pay

## 2020-02-25 DIAGNOSIS — F331 Major depressive disorder, recurrent, moderate: Secondary | ICD-10-CM

## 2020-02-25 NOTE — Progress Notes (Signed)
Virtual Visit via Video Note  I connected with Lisa Gutierrez on 02/25/20 at  3:30 PM EST by a video enabled telemedicine application and verified that I am speaking with the correct person using two identifiers.  Location: Patient: home Provider: [facility]   I discussed the limitations of evaluation and management by telemedicine and the availability of in person appointments. The patient expressed understanding and agreed to proceed.   The patient was advised to call back or seek an in-person evaluation if the symptoms worsen or if the condition fails to improve as anticipated.  I provided 60 minutes of non-face-to-face time during this encounter.   [place]ia Witz R Alessia Gonsalez, LCSW    THERAPIST PROGRESS NOTE  Session Time: 3:30-4:30p  Participation Level: Active  Behavioral Response: Neat and Well GroomedAlertDepressed and Dysphoric  Type of Therapy: Individual Therapy  Treatment Goals addressed: Coping  Interventions: CBT and Supportive  Summary: Lisa Gutierrez is a 69 y.o. female who presents with continuing symptoms related to depression diagnosis. Pt reports that she is compliant with medication "I don't really feel its working much, though".  Encouraged med compliance. Pt continues to have anxiety--worried about "all the animals that are neglected in the world".   Allowed pt to explore and express thoughts and feelings about current external stressors including: worrying about animals, social isolation, family conflict, church engagement, and trauma timeline.  Pt discussed continued worrying about animals and their overall wellbeing--pt admits that she frequently will drop off food regularly to local shelters to help out animals. Praised pts efforts and helped pt recognize what an impact her efforts make for so many animals.  Pt reports feeling socially isolated "I don't have any friends and my family doesn't want to be around me".  Pt is involved in church-related  activities, so encouraged pt to get involved in activities at church.  Pt states she hesitates because one time she overshared personal information to other members and feel that she was judged by them. Encouraged pt to support others and to listen to their stories when with them.  Allowed pt to share concerns about family conflict--pt feels neglected and left out when other members of the family plan trips and do things and often don't include her unless they need "a Public librarian".  Reminded pt that her son is trusting the most important thing in the world to him--his children. Discussed things about grandchildren and children that are disappointing to pt because they are not fulfilling pts expectations.  Will continue working on Nurse, adult.  Pt also upset with the way husband treats her--feels he is disrespectful and condescending. Encouraged pt to communicate her feelings to her husband in a healthy way.  Used CBT-based strategies to help pt manage overall symptoms.  Positive behaviors, exposure to more daylight, cognitively stimulating activities, spending quality time with pets, and positive social engagement.  Suicidal/Homicidal: No  SI, HI, or AVH reported at time of session. Pt states "i'm not worthy--I am a failure to myself and everyone else".  "I have no purpose on this earth".   Therapist Response: Lisa Gutierrez is continuing to express an increase in depression symptoms including low motivation, low initiative, frequent crying spells, low energy, negative thoughts about self/others. Refer for intensive outpatient through [facility]. Will continue OPT.   Plan: Return again in 2 weeks.   Diagnosis: Axis I: Major Depression, Recurrent moderate    Axis II: No diagnosis    [place], LCSW 02/25/2020

## 2020-03-06 DIAGNOSIS — Z789 Other specified health status: Secondary | ICD-10-CM | POA: Insufficient documentation

## 2020-03-09 ENCOUNTER — Other Ambulatory Visit: Payer: Self-pay

## 2020-03-09 MED ORDER — AZITHROMYCIN 250 MG PO TABS: ORAL_TABLET | ORAL | 0 refills | Status: DC

## 2020-03-09 NOTE — Telephone Encounter (Signed)
Pt called that having sinus infection ,sore throat and no fever and no body aches as per heather send zpak

## 2020-03-15 ENCOUNTER — Ambulatory Visit (INDEPENDENT_AMBULATORY_CARE_PROVIDER_SITE_OTHER): Payer: PPO | Admitting: Licensed Clinical Social Worker

## 2020-03-15 ENCOUNTER — Other Ambulatory Visit: Payer: Self-pay

## 2020-03-15 DIAGNOSIS — F331 Major depressive disorder, recurrent, moderate: Secondary | ICD-10-CM

## 2020-03-15 NOTE — Progress Notes (Signed)
Virtual Visit via Video Note  I connected with Lisa Gutierrez on 03/15/20 at 10:00 AM EST by a video enabled telemedicine application and verified that I am speaking with the correct person using two identifiers.  Video connection was lost when less than 50% of the duration of the visit was complete, at which time the remainder of the visit was completed via audio only.  Location: Patient: home Provider: [facility]   I discussed the limitations of evaluation and management by telemedicine and the availability of in person appointments. The patient expressed understanding and agreed to proceed.   The patient was advised to call back or seek an in-person evaluation if the symptoms worsen or if the condition fails to improve as anticipated.  I provided 60 minutes of non-face-to-face time during this encounter.   Christophe Rising R Jalene Lacko, LCSW   THERAPIST PROGRESS NOTE  Session Time: 10-11a  Participation Level: Active  Behavioral Response: Neat and Well GroomedAlertAnxious and Depressed  Type of Therapy: Individual Therapy  Treatment Goals addressed: Coping  Interventions: CBT  Summary: Lisa Gutierrez is a 69 y.o. female who presents with continuing depression/anxiety symptoms. Pt reports crying spells, lot motivation, low energy, and feelings of hopelessness. Pt reports that she is compliant with medication but feels it is not managing overall symptoms.   Allowed pt to explore and express thoughts and feelings about recent holiday/family experiences. Discussed specific family relationships and how pt feels negative about family members, and about self. Discussed specific situations and concept of radical acceptance--without the "good" and "bad" labels. Discussed expectations vs reality.    Encouraged positive social support, self care, and life balance.   Suicidal/Homicidal: No  Therapist Response: Lisa Gutierrez reports an increase in depression and anxiety symptoms, which is  reflective of intermittent/fluctuating progress. Reviewed treatment plan and made minor revisions. Pt has appt with psychiatrist 03/22/20.   Plan: Return again in 4 weeks. The ongoing treatment plan includes maintaining current levels of progress and continuing to build skills to manage mood, improve stress/anxiety management, emotion regulation, distress tolerance, and behavior modification.   Diagnosis: Axis I: Major Depression, Recurrent severe    Axis II: No diagnosis    [place], LCSW 03/15/2020

## 2020-03-16 ENCOUNTER — Other Ambulatory Visit: Payer: Self-pay

## 2020-03-16 MED ORDER — LINZESS 145 MCG PO CAPS
ORAL_CAPSULE | ORAL | 3 refills | Status: DC
Start: 2020-03-16 — End: 2020-07-13

## 2020-03-21 DIAGNOSIS — L281 Prurigo nodularis: Secondary | ICD-10-CM | POA: Diagnosis not present

## 2020-03-21 DIAGNOSIS — L570 Actinic keratosis: Secondary | ICD-10-CM | POA: Diagnosis not present

## 2020-03-21 DIAGNOSIS — B001 Herpesviral vesicular dermatitis: Secondary | ICD-10-CM | POA: Diagnosis not present

## 2020-03-22 ENCOUNTER — Encounter (HOSPITAL_COMMUNITY): Payer: Self-pay | Admitting: Psychiatry

## 2020-03-22 ENCOUNTER — Other Ambulatory Visit: Payer: Self-pay

## 2020-03-22 ENCOUNTER — Telehealth (INDEPENDENT_AMBULATORY_CARE_PROVIDER_SITE_OTHER): Payer: PPO | Admitting: Psychiatry

## 2020-03-22 DIAGNOSIS — F419 Anxiety disorder, unspecified: Secondary | ICD-10-CM | POA: Diagnosis not present

## 2020-03-22 DIAGNOSIS — F3341 Major depressive disorder, recurrent, in partial remission: Secondary | ICD-10-CM

## 2020-03-22 DIAGNOSIS — Z630 Problems in relationship with spouse or partner: Secondary | ICD-10-CM | POA: Diagnosis not present

## 2020-03-22 MED ORDER — VENLAFAXINE HCL ER 75 MG PO CP24: 75.0000 mg | ORAL_CAPSULE | Freq: Every day | ORAL | 1 refills | Status: DC

## 2020-03-22 NOTE — Progress Notes (Signed)
[place] MD OP Progress Note  Virtual Visit via Telephone Note  I connected with Fanny Agan on 03/22/20 at 11:40 AM EST by telephone and verified that I am speaking with the correct person using two identifiers.  Location: Patient: home Provider: Clinic   I discussed the limitations, risks, security and privacy concerns of performing an evaluation and management service by telephone and the availability of in person appointments. I also discussed with the patient that there may be a patient responsible charge related to this service. The patient expressed understanding and agreed to proceed.   I provided 26 minutes of non-face-to-face time during this encounter.    03/22/2020 11:45 AM Duana Benedict  MRN:  [id number]  Chief Complaint:  " I am a mess."  HPI: Pt informed that she returned back about 3 weeks ago from [place], [place]. She stated that the whole trip was a nightmare. She was upset as her son did not take her out for her b'day but took his wife's niece out for her b'day. She stated that she had a bad experience due to many factors. She informed that she fell sick 2 days before returning back home due to a viral syndrome. She was down for a few days when she returned back. COVID result was negative.  She is feeling better physically now. She c/o that she has no one to talk to at home. Her husband has started working 5 days a week which is much more than his usual working hours. She stated that lately on most of the Saturdays she feels very depressed and has bouts of crying.   She feels her sons are self-centered and do anything to help her and her elderly husband. She continues to feel unappreciated and un-loved by her family.  She is looking forward to hosting the family for Christmas meal. She wants the children to have fun. She is going to make it happy for everyone.  She informed that her sister continues to be hateful towards her and keeps giving her a hard  time.   Visit Diagnosis:    ICD-10-CM   1. MDD (major depressive disorder), recurrent, in partial remission ([place])  F33.41   2. Anxiety  F41.9   3. Marital relationship problem  Z63.0     Past Psychiatric History: depression, anxiety  Past Medical History:  Past Medical History:  Diagnosis Date  . Anxiety   . Depression   . Diabetes mellitus without complication ([place])   . GERD (gastroesophageal reflux disease)   . Hypertension   . IBS (irritable bowel syndrome)   . Neuromuscular disorder [facility])     Past Surgical History:  Procedure Laterality Date  . BREAST EXCISIONAL BIOPSY    . CHOLECYSTECTOMY    . EXCISION / BIOPSY BREAST / NIPPLE / DUCT Right 1973   duct removed    Family Psychiatric History: denied  Family History:  Family History  Problem Relation Age of Onset  . Diabetes Mother   . Hypertension Mother   . Coronary artery disease Father   . Glaucoma Father   . Breast cancer Neg Hx     Social History:  Social History   Socioeconomic History  . Marital status: Married    Spouse name: Not on file  . Number of children: Not on file  . Years of education: Not on file  . Highest education level: Not on file  Occupational History  . Not on file  Tobacco Use  . Smoking status:  Former Smoker  . Smokeless tobacco: Never Used  Substance and Sexual Activity  . Alcohol use: No    Alcohol/week: 0.0 standard drinks  . Drug use: Never  . Sexual activity: Not on file  Other Topics Concern  . Not on file  Social History Narrative  . Not on file   Social Determinants of Health   Financial Resource Strain:   . Difficulty of Paying Living Expenses: Not on file  Food Insecurity:   . Worried About Charity fundraiser in the Last Year: Not on file  . Ran Out of Food in the Last Year: Not on file  Transportation Needs:   . Lack of Transportation (Medical): Not on file  . Lack of Transportation (Non-Medical): Not on file  Physical Activity:   . Days of Exercise  per Week: Not on file  . Minutes of Exercise per Session: Not on file  Stress:   . Feeling of Stress : Not on file  Social Connections:   . Frequency of Communication with Friends and Family: Not on file  . Frequency of Social Gatherings with Friends and Family: Not on file  . Attends Religious Services: Not on file  . Active Member of Clubs or Organizations: Not on file  . Attends Archivist Meetings: Not on file  . Marital Status: Not on file    Allergies:  Allergies  Allergen Reactions  . Aspirin Other (See Comments)    GI upset  . Ibuprofen Other (See Comments)    GERD  . Levofloxacin Other (See Comments)    Weight gain  . Meloxicam Other (See Comments)    GI upset  . Morphine Other (See Comments)  . Naprosyn  [Naproxen] Other (See Comments)    GI upset  . Nsaids Other (See Comments)    GI upset  . Quinolones   . Robinul  [Glycopyrrolate] Nausea And Vomiting  . Penicillin V Potassium Rash  . Sulfa Antibiotics Rash    Metabolic Disorder Labs: Lab Results  Component Value Date   HGBA1C 6.6 (A) 02/12/2020   No results found for: PROLACTIN No results found for: CHOL, TRIG, HDL, CHOLHDL, VLDL, LDLCALC No results found for: TSH  Therapeutic Level Labs: No results found for: LITHIUM No results found for: VALPROATE No components found for:  CBMZ  Current Medications: Current Outpatient Medications  Medication Sig Dispense Refill  . acetaminophen (TYLENOL) 500 MG tablet Take 2 tablets by mouth as needed.    Marland Kitchen acetic acid-hydrocortisone (VOSOL-HC) OTIC solution Place 3 drops into both ears 3 (three) times daily. 10 mL 0  . ALPRAZolam (XANAX) 0.5 MG tablet Take 1 tablet (0.5 mg total) by mouth 2 (two) times daily as needed for anxiety. 60 tablet 0  . azithromycin (ZITHROMAX Z-PAK) 250 MG tablet Use as directed  For 5 days 6 each 0  . Calcium Citrate-Vitamin D (CALCIUM + D PO) Take 1 tablet by mouth daily.    . Flaxseed, Linseed, (FLAXSEED OIL PO) Take 1,500  mg by mouth 2 (two) times daily.    . fluconazole (DIFLUCAN) 150 MG tablet Take 1  tab  Po once for yeast infection and repeat in 3 days if symptoms persist 3 tablet 0  . glucose blood (ACCU-CHEK SMARTVIEW) test strip 1 each by Other route as needed for other. Use as instructed 200 each 11  . hydrocortisone (PROCTOZONE-HC) 2.5 % rectal cream Place 1 application rectally 2 (two) times daily. 30 g 0  . LINZESS 145 MCG  CAPS capsule Take 1 tab po daily 30 capsule 3  . meclizine (ANTIVERT) 25 MG tablet Take 1 tablet (25 mg total) by mouth 2 (two) times daily as needed for dizziness. 60 tablet 1  . metoprolol succinate (TOPROL-XL) 25 MG 24 hr tablet Take 2 tablets (50 mg total) by mouth daily. 180 tablet 0  . Multiple Vitamins-Minerals (MULTIVITAMIN ADULT PO) Take 1 tablet by mouth daily.    . nitrofurantoin, macrocrystal-monohydrate, (MACROBID) 100 MG capsule Take 1 capsule (100 mg total) by mouth 2 (two) times daily. 14 capsule 0  . nystatin (MYCOSTATIN/NYSTOP) powder Apply topically 2 (two) times daily. 60 g 0  . pantoprazole (PROTONIX) 40 MG tablet Take 1 tablet (40 mg total) by mouth 2 (two) times daily. 180 tablet 3  . polyethylene glycol powder (GLYCOLAX/MIRALAX) powder Take by mouth. (Patient not taking: Reported on 02/12/2020)    . sitaGLIPtin-metformin (JANUMET) 50-500 MG tablet Take 1 tablet by mouth 2 (two) times daily with a meal. 90 tablet 4  . telmisartan-hydrochlorothiazide (MICARDIS HCT) 80-25 MG tablet Take 1 tablet by mouth daily. 90 tablet 1  . venlafaxine XR (EFFEXOR XR) 75 MG 24 hr capsule Take 1 capsule (75 mg total) by mouth daily with breakfast. 30 capsule 1   No current facility-administered medications for this visit.     Musculoskeletal: Strength & Muscle Tone: unable to assess due to telemed visit Gait & Station: unable to assess due to telemed visit Patient leans: unable to assess due to telemed visit  Psychiatric Specialty Exam: ROS  There were no vitals taken for  this visit.There is no height or weight on file to calculate BMI.  General Appearance: Fairly groomed  Eye Contact: Good  Speech:  Clear and Coherent and Normal Rate  Volume:  Normal  Mood: anxious  Affect:  Congruent  Thought Process:  Goal Directed, Linear and Descriptions of Associations: Intact  Orientation:  Full (Time, Place, and Person)  Thought Content: Logical and Rumination   Suicidal Thoughts:  No  Homicidal Thoughts:  No  Memory:  Recent;   Good Remote;   Good  Judgement:  Fair  Insight:  Fair  Psychomotor Activity:  Normal  Concentration:  Concentration: Good and Attention Span: Good  Recall:  Good  Fund of Knowledge: Good  Language: Good  Akathisia:  Negative  Handed:  Right  AIMS (if indicated): not done  Assets:  Communication Skills Desire for Improvement Financial Resources/Insurance Housing  ADL's:  Intact  Cognition: WNL  Sleep:  Fair   Screenings: Mini-Mental     Clinical Support from 02/12/2020 in [facility] [place] from 02/06/2018 in [facility], [facility]  Total Score (max 30 points ) 29 30    PHQ2-9     Clinical Support from 02/12/2020 in [facility], [place] Visit from 01/11/2020 in [facility], [facility] Office Visit from 11/03/2019 in [facility] Office Visit from 03/17/2019 in [facility], [place] from 02/10/2019 in [facility], [facility]  PHQ-2 Total Score 3 1 0 0 6  PHQ-9 Total Score 15 -- -- 0 24       Assessment and Plan: Patient continues to deal with conflictual personal relationships. She keeps wondering if Effexor is the right medication for her. However, when reminded that all other antidepressant she tried caused her to have various side effects she agrees to continue the same regimen for now. Writer also reminded her that unless she reduces the stress due  to the personal relationships and conflicts in her life, medication may  not be full effective. Pt verbalized her understanding.  1. MDD (major depressive disorder), recurrent, in partial remission ([facility])  - venlafaxine XR (EFFEXOR XR) 75 MG 24 hr capsule; Take 1 capsule (75 mg total) by mouth daily with breakfast.  Dispense: 30 capsule; Refill: 1  2. Anxiety  - venlafaxine XR (EFFEXOR XR) 75 MG 24 hr capsule; Take 1 capsule (75 mg total) by mouth daily with breakfast.  Dispense: 30 capsule; Refill: 1  3. Marital relationship problem    Much supportive therapy provided during the session. Continue therapy with Ms. Kandice Moos. F/up in 8 weeks.   [place] Crane, MD 03/22/2020, 11:45 AM

## 2020-04-01 ENCOUNTER — Other Ambulatory Visit: Payer: Self-pay

## 2020-04-01 DIAGNOSIS — F411 Generalized anxiety disorder: Secondary | ICD-10-CM

## 2020-04-03 NOTE — Telephone Encounter (Signed)
She is seeing psychiatry. I don't fill this for her any longer.

## 2020-04-04 ENCOUNTER — Telehealth (HOSPITAL_COMMUNITY): Payer: Self-pay | Admitting: *Deleted

## 2020-04-04 ENCOUNTER — Other Ambulatory Visit: Payer: Self-pay

## 2020-04-04 DIAGNOSIS — F411 Generalized anxiety disorder: Secondary | ICD-10-CM

## 2020-04-04 MED ORDER — ALPRAZOLAM 0.5 MG PO TABS: 0.5000 mg | ORAL_TABLET | Freq: Two times a day (BID) | ORAL | 1 refills | Status: DC | PRN

## 2020-04-04 NOTE — Telephone Encounter (Signed)
Pt.notified

## 2020-04-04 NOTE — Telephone Encounter (Signed)
VM left for writer stating she just spoke with her PCP who has always written for her Alprazaloam and per her VM she suggests she is confused by who is supposed to write it but she needs it and her PCP didn't provide her a rx for it. Writer called her back and left her a message stating I will bring it to Dr Malachy Moan attention but it is unlikely she would write for it as she never has, her PCP has always written for it.

## 2020-04-04 NOTE — Telephone Encounter (Signed)
The Rx for Alprazolam has always been written by her PCP. However, I don't mind sending the refill RX.

## 2020-04-04 NOTE — Addendum Note (Signed)
Addended by: [place] Crane on: 04/04/2020 10:16 AM   Modules accepted: Orders

## 2020-04-04 NOTE — Telephone Encounter (Signed)
Called patient back to inform her Dr Toy Care has called in her Xanax.

## 2020-04-05 ENCOUNTER — Other Ambulatory Visit: Payer: Self-pay

## 2020-04-05 MED ORDER — ACCU-CHEK SMARTVIEW VI STRP: 1.0000 | ORAL_STRIP | 11 refills | Status: DC | PRN

## 2020-04-13 ENCOUNTER — Ambulatory Visit (INDEPENDENT_AMBULATORY_CARE_PROVIDER_SITE_OTHER): Payer: PPO | Admitting: Licensed Clinical Social Worker

## 2020-04-13 ENCOUNTER — Other Ambulatory Visit: Payer: Self-pay

## 2020-04-13 DIAGNOSIS — F3341 Major depressive disorder, recurrent, in partial remission: Secondary | ICD-10-CM

## 2020-04-13 DIAGNOSIS — F411 Generalized anxiety disorder: Secondary | ICD-10-CM

## 2020-04-13 NOTE — Progress Notes (Addendum)
Virtual Visit via Video Note  I connected with Lisa Gutierrez on 04/13/20 at 11:00 AM EST by a video enabled telemedicine application and verified that I am speaking with the correct person using two identifiers.  Video connection was lost when less than 50% of the duration of the visit was complete, at which time the remainder of the visit was completed via audio only.  Location: Patient: home Provider: remote office [place], [place])   I discussed the limitations of evaluation and management by telemedicine and the availability of in person appointments. The patient expressed understanding and agreed to proceed.  The patient was advised to call back or seek an in-person evaluation if the symptoms worsen or if the condition fails to improve as anticipated.  I provided 55 minutes of non-face-to-face time during this encounter.   Teandre Hamre R Merisa Julio, LCSW    THERAPIST PROGRESS NOTE  Session Time: 11-11:55a  Participation Level: Active  Behavioral Response: NAAlertAnxious and Depressed  Type of Therapy: Individual Therapy  Treatment Goals addressed: Anxiety and Coping  Interventions: Supportive and Other: trauma focused  Summary: Lisa Gutierrez is a 69 y.o. female who presents with continuing symptoms related to depression diagnosis. Pt reports continuing sadness, tearfulness, sense of hopelessness, and worthlessness. Pt reports good quality and quantity of sleep. Pt compliant with medication and feels that the medication is working.  Allowed pt to process through thoughts and feeling surrounding several family members: sister, youngest son, husband. Pt continues to feel like her sister is not supportive "she doesn't love me unconditionally". Pt is also not happy with her youngest son, and how he does not make their relationship a priority. Pt disappointed in husband--he was not nice to her at Christmas.  Discussed all relationships in depth and allowed pt to share  her expectations about how she feels the relationships "should be" and how the reality of the relationships is different. Discussed the discrepancy and how it triggers thoughts and feelings.   Pt was triggered and disclosed some childhood trauma that pt feels could be a major contributing factor to feeling low self worth. Pt described trauma in detail and was very emotional throughout disclosure.  Clinician carefully allowed pt to discuss thoughts and feelings about the trauma and parallel the thoughts/feelings to ways pt feels about self today.   Used guided imagery and present positive relationships to end session on a positive note. Encouraged pt to focus on self care, positive relationships, and activities that bring pt joy/happiness  Suicidal/Homicidal: Suicidal thoughts with no plans or intent to follow through.  Therapist Response: Pt experiencing an increase in symptoms due to trauma revelation/disclosure, which is indicative of intermittent/fluctuating progress.  Treatment to continue as indicated  Plan: Return again in 3 weeks.  Diagnosis: Axis I: Depression, partial remission; Generalized anxiety disorder    Axis II: No diagnosis    Ernest Haber Paulett Kaufhold, LCSW 04/13/2020

## 2020-04-26 ENCOUNTER — Other Ambulatory Visit: Payer: Self-pay

## 2020-04-26 DIAGNOSIS — I10 Essential (primary) hypertension: Secondary | ICD-10-CM

## 2020-04-26 MED ORDER — METOPROLOL SUCCINATE ER 25 MG PO TB24: 50.0000 mg | ORAL_TABLET | Freq: Every day | ORAL | 1 refills | Status: DC

## 2020-05-10 ENCOUNTER — Encounter: Payer: Self-pay | Admitting: Nurse Practitioner

## 2020-05-10 NOTE — Telephone Encounter (Signed)
Please call pt

## 2020-05-16 ENCOUNTER — Encounter (HOSPITAL_COMMUNITY): Payer: Self-pay | Admitting: Psychiatry

## 2020-05-16 ENCOUNTER — Other Ambulatory Visit: Payer: Self-pay

## 2020-05-16 ENCOUNTER — Telehealth (INDEPENDENT_AMBULATORY_CARE_PROVIDER_SITE_OTHER): Payer: PPO | Admitting: Psychiatry

## 2020-05-16 ENCOUNTER — Ambulatory Visit: Payer: PPO | Admitting: Hospice and Palliative Medicine

## 2020-05-16 DIAGNOSIS — F411 Generalized anxiety disorder: Secondary | ICD-10-CM

## 2020-05-16 DIAGNOSIS — F3341 Major depressive disorder, recurrent, in partial remission: Secondary | ICD-10-CM

## 2020-05-16 DIAGNOSIS — Z630 Problems in relationship with spouse or partner: Secondary | ICD-10-CM

## 2020-05-16 DIAGNOSIS — F419 Anxiety disorder, unspecified: Secondary | ICD-10-CM

## 2020-05-16 MED ORDER — ALPRAZOLAM 0.5 MG PO TABS: 0.5000 mg | ORAL_TABLET | Freq: Two times a day (BID) | ORAL | 1 refills | Status: DC | PRN

## 2020-05-16 MED ORDER — VENLAFAXINE HCL ER 75 MG PO CP24: 75.0000 mg | ORAL_CAPSULE | Freq: Every day | ORAL | 1 refills | Status: DC

## 2020-05-16 NOTE — Progress Notes (Signed)
[facility] MD OP Progress Note  Virtual Visit via Telephone Note  I connected with Lisa Gutierrez on 05/16/20 at 11:00 AM EST by telephone and verified that I am speaking with the correct person using two identifiers.  Location: Patient: home Provider: Clinic   I discussed the limitations, risks, security and privacy concerns of performing an evaluation and management service by telephone and the availability of in person appointments. I also discussed with the patient that there may be a patient responsible charge related to this service. The patient expressed understanding and agreed to proceed.   I provided 18 minutes of non-face-to-face time during this encounter.    05/16/2020 11:39 AM [name]  MRN:  [id number]  Chief Complaint:  " I am upset because my PCP left the practice and now I have to start with someone new."  HPI: Patient expressed frustration about missing her PCP who recently left her practice.  She stated that she had been seeing them for many years and is a big shock to her.  She stated that she really misses them and really wants to know where they are.  She is hoping that she can find them in start seeing them for her future care.  She spoke at great length how she has left messages but the other staff in the clinic keep finding them on epic. She asked the writer if she can help her find with a PCP is working now. She then spoke about how she had a very busy Christmas holiday as her oldest son came to visit with his family.  She stated that she tried her best to keep everyone happy and cooked nice meals for everyone. She then stated that her husband continues to be mean towards her and she gave few examples of how he was very dismissive and rude towards her.  She stated that she does not know what to do because he is very polite and nice to everyone else except for her.  She is stated that she does not have any financial backup left as she is done with all  her savings that she had very with her husband has a lot of money but does not want to spend any for her.  She then asked if her medication is responsible for causing weight gain because she has gained more weight.  She stated that she is eating a lot and feels her appetite is excessive.  She also stated that her appetite never returned back to baseline ever since she contracted Covid last year.  Writer reassured her that venlafaxine is generally not associated with any increase in appetite or gaining weight.  Writer recommended she continue the same regimen and continues her therapy services with Ms. Christina.   Visit Diagnosis:    ICD-10-CM   1. MDD (major depressive disorder), recurrent, in partial remission ([place])  F33.41   2. Anxiety  F41.9   3. Marital relationship problem  Z63.0     Past Psychiatric History: depression, anxiety  Past Medical History:  Past Medical History:  Diagnosis Date  . Anxiety   . Depression   . Diabetes mellitus without complication ([place])   . GERD (gastroesophageal reflux disease)   . Hypertension   . IBS (irritable bowel syndrome)   . Neuromuscular disorder [facility])     Past Surgical History:  Procedure Laterality Date  . BREAST EXCISIONAL BIOPSY    . CHOLECYSTECTOMY    . EXCISION / BIOPSY BREAST / NIPPLE / DUCT  Right 1973   duct removed    Family Psychiatric History: denied  Family History:  Family History  Problem Relation Age of Onset  . Diabetes Mother   . Hypertension Mother   . Coronary artery disease Father   . Glaucoma Father   . Breast cancer Neg Hx     Social History:  Social History   Socioeconomic History  . Marital status: Married    Spouse name: Not on file  . Number of children: Not on file  . Years of education: Not on file  . Highest education level: Not on file  Occupational History  . Not on file  Tobacco Use  . Smoking status: Former Research scientist (life sciences)  . Smokeless tobacco: Never Used  Substance and Sexual Activity  .  Alcohol use: No    Alcohol/week: 0.0 standard drinks  . Drug use: Never  . Sexual activity: Not on file  Other Topics Concern  . Not on file  Social History Narrative  . Not on file   Social Determinants of Health   Financial Resource Strain: Not on file  Food Insecurity: Not on file  Transportation Needs: Not on file  Physical Activity: Not on file  Stress: Not on file  Social Connections: Not on file    Allergies:  Allergies  Allergen Reactions  . Aspirin Other (See Comments)    GI upset  . Ibuprofen Other (See Comments)    GERD  . Levofloxacin Other (See Comments)    Weight gain  . Meloxicam Other (See Comments)    GI upset  . Morphine Other (See Comments)  . Naprosyn  [Naproxen] Other (See Comments)    GI upset  . Nsaids Other (See Comments)    GI upset  . Quinolones   . Robinul  [Glycopyrrolate] Nausea And Vomiting  . Penicillin V Potassium Rash  . Sulfa Antibiotics Rash    Metabolic Disorder Labs: Lab Results  Component Value Date   HGBA1C 6.6 (A) 02/12/2020   No results found for: PROLACTIN No results found for: CHOL, TRIG, HDL, CHOLHDL, VLDL, LDLCALC No results found for: TSH  Therapeutic Level Labs: No results found for: LITHIUM No results found for: VALPROATE No components found for:  CBMZ  Current Medications: Current Outpatient Medications  Medication Sig Dispense Refill  . acetaminophen (TYLENOL) 500 MG tablet Take 2 tablets by mouth as needed.    Marland Kitchen acetic acid-hydrocortisone (VOSOL-HC) OTIC solution Place 3 drops into both ears 3 (three) times daily. 10 mL 0  . ALPRAZolam (XANAX) 0.5 MG tablet Take 1 tablet (0.5 mg total) by mouth 2 (two) times daily as needed for anxiety. 60 tablet 1  . azithromycin (ZITHROMAX Z-PAK) 250 MG tablet Use as directed  For 5 days 6 each 0  . Calcium Citrate-Vitamin D (CALCIUM + D PO) Take 1 tablet by mouth daily.    . Flaxseed, Linseed, (FLAXSEED OIL PO) Take 1,500 mg by mouth 2 (two) times daily.    .  fluconazole (DIFLUCAN) 150 MG tablet Take 1  tab  Po once for yeast infection and repeat in 3 days if symptoms persist 3 tablet 0  . glucose blood (ACCU-CHEK SMARTVIEW) test strip 1 each by Other route as needed for other. Use as instructed 200 each 11  . hydrocortisone (PROCTOZONE-HC) 2.5 % rectal cream Place 1 application rectally 2 (two) times daily. 30 g 0  . LINZESS 145 MCG CAPS capsule Take 1 tab po daily 30 capsule 3  . meclizine (ANTIVERT) 25 MG  tablet Take 1 tablet (25 mg total) by mouth 2 (two) times daily as needed for dizziness. 60 tablet 1  . metoprolol succinate (TOPROL-XL) 25 MG 24 hr tablet Take 2 tablets (50 mg total) by mouth daily. 180 tablet 1  . Multiple Vitamins-Minerals (MULTIVITAMIN ADULT PO) Take 1 tablet by mouth daily.    . nitrofurantoin, macrocrystal-monohydrate, (MACROBID) 100 MG capsule Take 1 capsule (100 mg total) by mouth 2 (two) times daily. 14 capsule 0  . nystatin (MYCOSTATIN/NYSTOP) powder Apply topically 2 (two) times daily. 60 g 0  . pantoprazole (PROTONIX) 40 MG tablet Take 1 tablet (40 mg total) by mouth 2 (two) times daily. 180 tablet 3  . polyethylene glycol powder (GLYCOLAX/MIRALAX) powder Take by mouth. (Patient not taking: Reported on 02/12/2020)    . sitaGLIPtin-metformin (JANUMET) 50-500 MG tablet Take 1 tablet by mouth 2 (two) times daily with a meal. 90 tablet 4  . telmisartan-hydrochlorothiazide (MICARDIS HCT) 80-25 MG tablet Take 1 tablet by mouth daily. 90 tablet 1  . venlafaxine XR (EFFEXOR XR) 75 MG 24 hr capsule Take 1 capsule (75 mg total) by mouth daily with breakfast. 30 capsule 1   No current facility-administered medications for this visit.     Musculoskeletal: Strength & Muscle Tone: unable to assess due to telemed visit Gait & Station: unable to assess due to telemed visit Patient leans: unable to assess due to telemed visit  Psychiatric Specialty Exam: ROS  There were no vitals taken for this visit.There is no height or weight  on file to calculate BMI.  General Appearance: unable to assess due to phone visit  Eye Contact: unable to assess due to phone visit  Speech:  Clear and Coherent and Normal Rate  Volume:  Normal  Mood: anxious  Affect:  Congruent  Thought Process:  Goal Directed, Linear and Descriptions of Associations: Intact  Orientation:  Full (Time, Place, and Person)  Thought Content: Logical and Rumination   Suicidal Thoughts:  No  Homicidal Thoughts:  No  Memory:  Recent;   Good Remote;   Good  Judgement:  Fair  Insight:  Fair  Psychomotor Activity:  Normal  Concentration:  Concentration: Good and Attention Span: Good  Recall:  Good  Fund of Knowledge: Good  Language: Good  Akathisia:  Negative  Handed:  Right  AIMS (if indicated): not done  Assets:  Communication Skills Desire for Improvement Financial Resources/Insurance Housing  ADL's:  Intact  Cognition: WNL  Sleep:  Fair   Screenings: Mini-Mental   Flowsheet Row Clinical Support from 02/12/2020 in [facility], [place] from 02/06/2018 in [facility], [facility]  Total Score (max 30 points ) 29 30    PHQ2-9   [place] from 02/12/2020 in [facility], [facility] Office Visit from 01[facility]11/2020 in [facility], [facility] Office Visit from 11/03/2019 in [facility], [facility] Office Visit from 03/17/2019 in [facility], [place] from 02/10/2019 in [facility], [facility]  PHQ-2 Total Score 3 1 0 0 6  PHQ-9 Total Score 15 -- -- 0 24       Assessment and Plan: Patient continues to deal with marital discord with her husband.  She is currently upset because her PCP who she was seen for the past few years recently left their clinical practice and patient was never notified regarding this.  She misses her PCP in hopes that she can find them for continuing her care in the future. Supportive psychotherapy  was provided to the patient during the  session.  1. MDD (major depressive disorder), recurrent, in partial remission ([facility])  - venlafaxine XR (EFFEXOR XR) 75 MG 24 hr capsule; Take 1 capsule (75 mg total) by mouth daily with breakfast.  Dispense: 30 capsule; Refill: 1  2. Anxiety  - ALPRAZolam (XANAX) 0.5 MG tablet; Take 1 tablet (0.5 mg total) by mouth 2 (two) times daily as needed for anxiety.  Dispense: 60 tablet; Refill: 1 - venlafaxine XR (EFFEXOR XR) 75 MG 24 hr capsule; Take 1 capsule (75 mg total) by mouth daily with breakfast.  Dispense: 30 capsule; Refill: 1  3. Marital relationship problem    Much supportive therapy provided during the session. Continue therapy with Ms. Kandice Moos. F/up in 8 weeks.   [place] Crane, MD 05/16/2020, 11:39 AM

## 2020-05-19 ENCOUNTER — Ambulatory Visit (INDEPENDENT_AMBULATORY_CARE_PROVIDER_SITE_OTHER): Payer: PPO | Admitting: Licensed Clinical Social Worker

## 2020-05-19 ENCOUNTER — Other Ambulatory Visit: Payer: Self-pay

## 2020-05-19 DIAGNOSIS — F419 Anxiety disorder, unspecified: Secondary | ICD-10-CM | POA: Diagnosis not present

## 2020-05-19 DIAGNOSIS — F3341 Major depressive disorder, recurrent, in partial remission: Secondary | ICD-10-CM | POA: Diagnosis not present

## 2020-05-19 NOTE — Progress Notes (Signed)
Virtual Visit via Video Note  I connected with Lisa Gutierrez on 05/19/20 at  1:00 PM EST by a video enabled telemedicine application and verified that I am speaking with the correct person using two identifiers.  Location: Patient: home Provider: [facility]   I discussed the limitations of evaluation and management by telemedicine and the availability of in person appointments. The patient expressed understanding and agreed to proceed.  I discussed the assessment and treatment plan with the patient. The patient was provided an opportunity to ask questions and all were answered. The patient agreed with the plan and demonstrated an understanding of the instructions.   The patient was advised to call back or seek an in-person evaluation if the symptoms worsen or if the condition fails to improve as anticipated.  I provided 44 minutes of non-face-to-face time during this encounter.   Lisa Gutierrez R Shailah Gibbins, LCSW    THERAPIST PROGRESS NOTE  Session Time: 1-1:44p  Participation Level: Active  Behavioral Response: Neat and Well GroomedAlertAnxious and Depressed  Type of Therapy: Individual Therapy  Treatment Goals addressed: Manage anxiety; improve overall mood; process trauma  Interventions: CBT, Supportive and Other: trauma-focused  Summary: Lisa Gutierrez is a 70 y.o. female who presents with continuing symptoms related to depression/anxiety. Pt reports that she is compliant with medication and getting fair quality and quantity of sleep. Pt states that current medication is triggering sweating and heat flushes which make pt feel uncomfortable. Pt states that she would like new medications but psychiatrist wants pt to stay on current medication regime.   Allowed pt to explore and express thoughts and feelings about past trauma, current relationship issues, and ways that pt is being intentional about making positive changes in life.   Pt discussed trauma and overall trauma  response (grief, depression, anger). Discussed emotion regulation tools. Pt feels that she's doing a better job.  Discussed peer support groups in [place]. Insurance/cost was a barrier of IOP in the past for pt.   Emailed resources to get in [employer] with group leaders in [place].   Suicidal/Homicidal: No  Therapist Response: Bernardine is improving in her ability to recall the traumatic event without becoming overwhelmed with negative emotions. Pt is also being intention about developing and implementing effective coping skills to carry out normal responsibilities and participate constructively in relationships. Pt is verbalizing hopeful and positive statements regarding the future. These statements are reflective of overall progress and evidence that Lisa Gutierrez is closer to goal achievement. Treatment to continue as indicated.   Plan: Return again in 4 weeks. Recommending groups [place].  Diagnosis: Axis I: MDD, recurrent, in partial remission; GAD    Axis II: No diagnosis    [place], LCSW 05/19/2020

## 2020-05-20 ENCOUNTER — Other Ambulatory Visit: Payer: Self-pay

## 2020-05-20 DIAGNOSIS — B372 Candidiasis of skin and nail: Secondary | ICD-10-CM

## 2020-05-20 MED ORDER — NYSTATIN 100000 UNIT/GM EX POWD
Freq: Two times a day (BID) | CUTANEOUS | 0 refills | Status: DC
Start: 2020-05-20 — End: 2020-08-29

## 2020-05-27 DIAGNOSIS — M1712 Unilateral primary osteoarthritis, left knee: Secondary | ICD-10-CM | POA: Diagnosis not present

## 2020-05-27 DIAGNOSIS — M545 Low back pain, unspecified: Secondary | ICD-10-CM | POA: Diagnosis not present

## 2020-05-27 DIAGNOSIS — M25512 Pain in left shoulder: Secondary | ICD-10-CM | POA: Diagnosis not present

## 2020-05-27 DIAGNOSIS — M75122 Complete rotator cuff tear or rupture of left shoulder, not specified as traumatic: Secondary | ICD-10-CM | POA: Diagnosis not present

## 2020-05-27 DIAGNOSIS — M65312 Trigger thumb, left thumb: Secondary | ICD-10-CM | POA: Diagnosis not present

## 2020-05-27 DIAGNOSIS — M25541 Pain in joints of right hand: Secondary | ICD-10-CM | POA: Diagnosis not present

## 2020-06-01 ENCOUNTER — Ambulatory Visit: Payer: PPO | Admitting: Hospice and Palliative Medicine

## 2020-06-01 DIAGNOSIS — M6281 Muscle weakness (generalized): Secondary | ICD-10-CM | POA: Diagnosis not present

## 2020-06-01 DIAGNOSIS — G8929 Other chronic pain: Secondary | ICD-10-CM | POA: Diagnosis not present

## 2020-06-01 DIAGNOSIS — M2560 Stiffness of unspecified joint, not elsewhere classified: Secondary | ICD-10-CM | POA: Diagnosis not present

## 2020-06-01 DIAGNOSIS — M25512 Pain in left shoulder: Secondary | ICD-10-CM | POA: Diagnosis not present

## 2020-06-01 DIAGNOSIS — M545 Low back pain, unspecified: Secondary | ICD-10-CM | POA: Diagnosis not present

## 2020-06-08 ENCOUNTER — Other Ambulatory Visit: Payer: Self-pay

## 2020-06-08 ENCOUNTER — Ambulatory Visit (INDEPENDENT_AMBULATORY_CARE_PROVIDER_SITE_OTHER): Payer: PPO | Admitting: Licensed Clinical Social Worker

## 2020-06-08 DIAGNOSIS — F3341 Major depressive disorder, recurrent, in partial remission: Secondary | ICD-10-CM

## 2020-06-08 DIAGNOSIS — F419 Anxiety disorder, unspecified: Secondary | ICD-10-CM

## 2020-06-08 NOTE — Progress Notes (Signed)
Virtual Visit via Video Note  I connected with Lisa Gutierrez on 06/08/20 at  1:00 PM EST by a video enabled telemedicine application and verified that I am speaking with the correct person using two identifiers.  Location: Patient: home Provider: remote office [place], [place])   I discussed the limitations of evaluation and management by telemedicine and the availability of in person appointments. The patient expressed understanding and agreed to proceed.  I discussed the assessment and treatment plan with the patient. The patient was provided an opportunity to ask questions and all were answered. The patient agreed with the plan and demonstrated an understanding of the instructions.   The patient was advised to call back or seek an in-person evaluation if the symptoms worsen or if the condition fails to improve as anticipated.  I provided 60 minutes of non-face-to-face time during this encounter.   Lisa Gutierrez R Lisa Montero, LCSW    THERAPIST PROGRESS NOTE  Session Time: 1-2p  Participation Level: Active  Behavioral Response: NeatAlertAnxious and Depressed  Type of Therapy: Individual Therapy  Treatment Goals addressed: Coping  Interventions: CBT and DBT  Summary: Lisa Gutierrez is a 69 y.o. female who presents with symptoms consistent with depression and anxiety. Pt reports that overall mood is stable with a few days of depression here and there. Pt reports that some days her anxiety are bad "I just don't know what to do with myself".    Allowed pt safe space to explore thoughts and feelings about relationships with her children and grandchildren. Pt initially focused on negatives--but recognized this and started focusing on the positives. Praised pts ability to reframe scenarios without being prompted to do so, and encouraged her to continue.  Pt states that she is happy that she found her PCP (left the practice she was going to and joined one in [place]). Pt  wants to make healthier choices and live a healthier lifestyle. Allowed pt to discuss what changes she wanted to make and encouraged pt that counselor would help support her choices. Pt will check in with PCP first prior to making any changes.  Encouraged positive social supports, life balance.  Suicidal/Homicidal: No  Therapist Response:  Lisa Gutierrez is being intentional about setting positive goals for the future and identifying changes that she would like to make for herself. This is evidence of growth and taking steps towards progress. Treatment to continue as indicated.   Plan: Return again in 4 weeks.  Diagnosis: Axis I: MDD, recurrent, in partial remission; anxiety    Axis II: No diagnosis    Rachel Bo Elison Worrel, LCSW 06/08/2020

## 2020-06-09 DIAGNOSIS — G8929 Other chronic pain: Secondary | ICD-10-CM | POA: Diagnosis not present

## 2020-06-09 DIAGNOSIS — M545 Low back pain, unspecified: Secondary | ICD-10-CM | POA: Diagnosis not present

## 2020-06-10 ENCOUNTER — Ambulatory Visit: Payer: PPO | Admitting: Hospice and Palliative Medicine

## 2020-06-17 DIAGNOSIS — G8929 Other chronic pain: Secondary | ICD-10-CM | POA: Diagnosis not present

## 2020-06-17 DIAGNOSIS — M545 Low back pain, unspecified: Secondary | ICD-10-CM | POA: Diagnosis not present

## 2020-06-27 ENCOUNTER — Other Ambulatory Visit: Payer: Self-pay | Admitting: Orthopedic Surgery

## 2020-06-27 DIAGNOSIS — M65311 Trigger thumb, right thumb: Secondary | ICD-10-CM | POA: Diagnosis not present

## 2020-06-27 DIAGNOSIS — M75122 Complete rotator cuff tear or rupture of left shoulder, not specified as traumatic: Secondary | ICD-10-CM | POA: Diagnosis not present

## 2020-06-27 DIAGNOSIS — M1712 Unilateral primary osteoarthritis, left knee: Secondary | ICD-10-CM | POA: Diagnosis not present

## 2020-06-29 ENCOUNTER — Ambulatory Visit (INDEPENDENT_AMBULATORY_CARE_PROVIDER_SITE_OTHER): Payer: PPO | Admitting: Licensed Clinical Social Worker

## 2020-06-29 ENCOUNTER — Other Ambulatory Visit: Payer: Self-pay

## 2020-06-29 DIAGNOSIS — F3341 Major depressive disorder, recurrent, in partial remission: Secondary | ICD-10-CM | POA: Diagnosis not present

## 2020-06-29 DIAGNOSIS — F419 Anxiety disorder, unspecified: Secondary | ICD-10-CM

## 2020-06-29 NOTE — Progress Notes (Signed)
Virtual Visit via Video Note  I connected with Lisa Gutierrez on 06/29/20 at 11:00 AM EDT by a video enabled telemedicine application and verified that I am speaking with the correct person using two identifiers.  Location: Patient: home Provider: [facility]   I discussed the limitations of evaluation and management by telemedicine and the availability of in person appointments. The patient expressed understanding and agreed to proceed.  I discussed the assessment and treatment plan with the patient. The patient was provided an opportunity to ask questions and all were answered. The patient agreed with the plan and demonstrated an understanding of the instructions.   The patient was advised to call back or seek an in-person evaluation if the symptoms worsen or if the condition fails to improve as anticipated.  I provided 60 minutes of non-face-to-face time during this encounter.   [place], LCSW    THERAPIST PROGRESS NOTE  Session Time: 11a-12p  Participation Level: Active  Behavioral Response: NeatAlertAnxious  Type of Therapy: Individual Therapy  Treatment Goals addressed: Anxiety and Coping  Interventions: CBT, DBT and Supportive  Summary: Lisa Gutierrez is a 70 y.o. female who presents with recently escalating symptoms associated with anxiety/stress. Pt reports that mood fluctuates at times depending on what is going on situationally. Pt reports fair quality and quantity of sleep.  Allowed pt safe space to explore and express thoughts and feelings about current symptoms escalation. Pt feels that medication is not managing symptoms well.  Discussed current relationship with husband and not internalizing comments that he states. Discussed relationship-building techniques and discussed cognitively stimulating activities for pt to be involved with. Reviewed anxiety management strategies.  Continued recommendations are as follows: self care behaviors,  positive social engagements, focusing on overall work/home/life balance, and focusing on positive physical and emotional wellness.   Suicidal/Homicidal: No  Therapist Response: Micheale is reporting an escalation in anxiety symptoms since last session which is indicative of intermittent/fluctuating progress. Treatment to continue as indicated.  Plan: Return again in 3 weeks.  Diagnosis: Axis I: Generalized Anxiety Disorder and Major Depression, Recurrent severe    Axis II: No diagnosis    Rachel Bo Autym Siess, LCSW 06/29/2020

## 2020-06-30 ENCOUNTER — Ambulatory Visit: Payer: PPO | Admitting: Hospice and Palliative Medicine

## 2020-07-01 ENCOUNTER — Ambulatory Visit: Payer: PPO | Admitting: Nurse Practitioner

## 2020-07-05 ENCOUNTER — Ambulatory Visit (INDEPENDENT_AMBULATORY_CARE_PROVIDER_SITE_OTHER): Payer: PPO | Admitting: Nurse Practitioner

## 2020-07-05 ENCOUNTER — Encounter: Payer: Self-pay | Admitting: Nurse Practitioner

## 2020-07-05 ENCOUNTER — Telehealth: Payer: Self-pay | Admitting: Nurse Practitioner

## 2020-07-05 ENCOUNTER — Other Ambulatory Visit: Payer: Self-pay

## 2020-07-05 VITALS — BP 122/86 | HR 95 | Temp 98.3°F | Ht 66.0 in | Wt 245.7 lb

## 2020-07-05 DIAGNOSIS — F411 Generalized anxiety disorder: Secondary | ICD-10-CM | POA: Diagnosis not present

## 2020-07-05 DIAGNOSIS — Z7689 Persons encountering health services in other specified circumstances: Secondary | ICD-10-CM | POA: Insufficient documentation

## 2020-07-05 DIAGNOSIS — I10 Essential (primary) hypertension: Secondary | ICD-10-CM | POA: Diagnosis not present

## 2020-07-05 DIAGNOSIS — F3341 Major depressive disorder, recurrent, in partial remission: Secondary | ICD-10-CM | POA: Diagnosis not present

## 2020-07-05 DIAGNOSIS — E1165 Type 2 diabetes mellitus with hyperglycemia: Secondary | ICD-10-CM | POA: Diagnosis not present

## 2020-07-05 DIAGNOSIS — Z1231 Encounter for screening mammogram for malignant neoplasm of breast: Secondary | ICD-10-CM

## 2020-07-05 DIAGNOSIS — K219 Gastro-esophageal reflux disease without esophagitis: Secondary | ICD-10-CM

## 2020-07-05 LAB — POCT GLYCOSYLATED HEMOGLOBIN (HGB A1C): Hemoglobin A1C: 7.1 % — AB (ref 4.0–5.6)

## 2020-07-05 MED ORDER — JANUMET XR 100-1000 MG PO TB24: 1.0000 | ORAL_TABLET | Freq: Every day | ORAL | 1 refills | Status: DC

## 2020-07-05 MED ORDER — TELMISARTAN-HCTZ 80-25 MG PO TABS: 1.0000 | ORAL_TABLET | Freq: Every day | ORAL | 1 refills | Status: DC

## 2020-07-05 MED ORDER — PANTOPRAZOLE SODIUM 40 MG PO TBEC: 40.0000 mg | DELAYED_RELEASE_TABLET | Freq: Two times a day (BID) | ORAL | 1 refills | Status: DC

## 2020-07-05 NOTE — Progress Notes (Signed)
New Patient Office Visit  Subjective:  Patient ID: Lisa Gutierrez, female    DOB: [birth year]  Age: 70 y.o. MRN: [id number]  CC:  Chief Complaint  Patient presents with  . New Patient (Initial Visit)    HPI Grasiela Jonsson presents to establish new primary care office. She has changed practices to stay with her current PCP. Since she was last seen in 01/2020, she fell off a ladder in her home doing repairs. She took injury to the left knee and left shoulder. She sees orthopedic provider, Dr. Rudene Christians. She is scheduled to have a Synvisc injection into the left knee on 07/21/2020. She states that X-ray indicated a rotator cuff injury, likely a tear of the rotator cuff. She has decided not to have further imaging done of the shoulder as she does not wish to have surgical repair of the shoulder at this time. She is also seeing Dr. Rudene Christians for significant stiffness in the right thumb. She is unable to bend the thumb at the medial joint. She is scheduled to have a procedure on 07/25/2020 to repair this joint.  Blood sugars have been slightly elevated since October 2021. Her HgbA1c is 7.1, up from 6.6 at the most recent check. She is currently taking Janumet 50/500mg  twice daily. She continues to see a therapist, Margreta Journey, and a psychiatrist, Dr. Buddy Duty. She was recently started on venlafaxine 75mg  daily. She states that she has been having hot flashes, increased hunger, weight gain, and anxiety at night, since starting this medication. She is unsure if the symptoms are related to the new medication or if they became worse after having COVID 19 for the second time.  The patient has been fully vaccinated against COVID 19, including booster.  Blood pressur e is well managed.   Past Medical History:  Diagnosis Date  . Anxiety   . Depression   . Depression    Phreesia 07/02/2020  . Diabetes mellitus without complication ([place])   . GERD (gastroesophageal reflux disease)   . Hypertension   . IBS  (irritable bowel syndrome)   . Neuromuscular disorder [facility])     Past Surgical History:  Procedure Laterality Date  . BREAST EXCISIONAL BIOPSY    . CESAREAN SECTION N/A    Phreesia 07/02/2020  . CHOLECYSTECTOMY    . EXCISION / BIOPSY BREAST / NIPPLE / DUCT Right 1973   duct removed  . SPINE SURGERY N/A    Phreesia 07/02/2020  . TUBAL LIGATION N/A    Phreesia 07/02/2020    Family History  Problem Relation Age of Onset  . Diabetes Mother   . Hypertension Mother   . Coronary artery disease Father   . Glaucoma Father   . Breast cancer Neg Hx     Social History   Socioeconomic History  . Marital status: Married    Spouse name: Not on file  . Number of children: Not on file  . Years of education: Not on file  . Highest education level: Not on file  Occupational History  . Not on file  Tobacco Use  . Smoking status: Former Research scientist (life sciences)  . Smokeless tobacco: Never Used  Substance and Sexual Activity  . Alcohol use: No    Alcohol/week: 0.0 standard drinks  . Drug use: Never  . Sexual activity: Not Currently    Partners: Male  Other Topics Concern  . Not on file  Social History Narrative  . Not on file   Social Determinants of Health   Financial  Resource Strain: Not on file  Food Insecurity: Not on file  Transportation Needs: Not on file  Physical Activity: Not on file  Stress: Not on file  Social Connections: Not on file  Intimate Partner Violence: Not on file    ROS Review of Systems  Constitutional: Positive for appetite change, fatigue and unexpected weight change. Negative for chills and fever.       She states that she is hungrier and has had weight gain over the past several months  HENT: Negative for congestion and sinus pain.   Eyes: Negative.   Respiratory: Negative for cough and wheezing.   Cardiovascular: Negative for chest pain and palpitations.  Gastrointestinal: Positive for constipation, diarrhea and nausea.  Endocrine: Positive for heat  intolerance and polyphagia.       Mildly elevated blood sugars recently   Musculoskeletal: Positive for arthralgias and myalgias. Negative for back pain.       Left shoulder and left knee pain. Stiffness of right thumb.   Skin: Negative for rash.  Neurological: Negative for dizziness, weakness and headaches.  Psychiatric/Behavioral: Positive for dysphoric mood. The patient is nervous/anxious.   All other systems reviewed and are negative.   Objective:   Today's Vitals   07/05/20 1125  BP: 122/86  Pulse: 95  Temp: 98.3 F (36.8 C)  SpO2: 95%  Weight: 245 lb 11.2 oz (111.4 kg)  Height: 5\' 6"  (1.676 m)   Body mass index is 39.66 kg/m.   Physical Exam Vitals and nursing note reviewed.  Constitutional:      Appearance: Normal appearance. She is well-developed. She is obese.  HENT:     Head: Normocephalic and atraumatic.     Right Ear: Tympanic membrane is erythematous.     Left Ear: Ear canal and external ear normal.  Eyes:     Pupils: Pupils are equal, round, and reactive to light.  Cardiovascular:     Rate and Rhythm: Normal rate and regular rhythm.     Pulses: Normal pulses.     Heart sounds: Normal heart sounds.  Pulmonary:     Effort: Pulmonary effort is normal.     Breath sounds: Normal breath sounds.  Abdominal:     Palpations: Abdomen is soft.  Musculoskeletal:        General: Normal range of motion.     Cervical back: Normal range of motion and neck supple.  Skin:    General: Skin is warm and dry.  Neurological:     General: No focal deficit present.     Mental Status: She is alert and oriented to person, place, and time.  Psychiatric:        Attention and Perception: Attention and perception normal.        Mood and Affect: Affect normal. Mood is anxious.        Speech: Speech normal.        Behavior: Behavior normal. Behavior is cooperative.        Thought Content: Thought content normal.        Cognition and Memory: Cognition and memory normal.         Judgment: Judgment normal.      Assessment & Plan:  1. Encounter to establish care Appointment today to establish care at new primary care office.   2. Type 2 diabetes mellitus with hyperglycemia, without long-term current use of insulin (HCC) - POCT glycosylated hemoglobin (Hb A1C) 7.1 today, up from 6.6 at last check in October 2021. Change Janumet to  Janumet XR 100/1000mg  daily. A new prescription was provided to her so that she can send to pharmaceutical company as they provide this to her free of charge.  - SitaGLIPtin-MetFORMIN HCl (JANUMET XR) [phone] MG TB24; Take 1 tablet by mouth daily.  Dispense: 90 tablet; Refill: 1  3. Essential hypertension Stable. Continue bp medication as prescribed   4. GAD (generalized anxiety disorder) Advised patient to continue with therapist and psychiatrist as scheduled.   5. MDD (major depressive disorder), recurrent, in partial remission [facility]) Patient feels as though she is having negative side effects related to venlafaxine. Encouraged her to discuss trial of desvenlafaxine to improve side effects.    Problem List Items Addressed This Visit      Cardiovascular and Mediastinum   Essential hypertension     Endocrine   Type 2 diabetes mellitus with hyperglycemia, without long-term current use of insulin (HCC)   Relevant Medications   SitaGLIPtin-MetFORMIN HCl (JANUMET XR) [phone] MG TB24   Other Relevant Orders   POCT glycosylated hemoglobin (Hb A1C) (Completed)     Other   GAD (generalized anxiety disorder)   MDD (major depressive disorder), recurrent, in partial remission ([place])   Encounter to establish care - Primary      Outpatient Encounter Medications as of 07/05/2020  Medication Sig  . acetaminophen (TYLENOL) 500 MG tablet Take 2 tablets by mouth as needed.  Marland Kitchen acetic acid-hydrocortisone (VOSOL-HC) OTIC solution Place 3 drops into both ears 3 (three) times daily.  Marland Kitchen ALPRAZolam (XANAX) 0.5 MG tablet Take 1 tablet (0.5 mg total)  by mouth 2 (two) times daily as needed for anxiety.  Marland Kitchen azithromycin (ZITHROMAX Z-PAK) 250 MG tablet Use as directed  For 5 days  . Calcium Citrate-Vitamin D (CALCIUM + D PO) Take 1 tablet by mouth daily.  . Flaxseed, Linseed, (FLAXSEED OIL PO) Take 1,500 mg by mouth 2 (two) times daily.  . fluconazole (DIFLUCAN) 150 MG tablet Take 1  tab  Po once for yeast infection and repeat in 3 days if symptoms persist  . glucose blood (ACCU-CHEK SMARTVIEW) test strip 1 each by Other route as needed for other. Use as instructed  . hydrocortisone (PROCTOZONE-HC) 2.5 % rectal cream Place 1 application rectally 2 (two) times daily.  Marland Kitchen LINZESS 145 MCG CAPS capsule Take 1 tab po daily  . meclizine (ANTIVERT) 25 MG tablet Take 1 tablet (25 mg total) by mouth 2 (two) times daily as needed for dizziness.  . metoprolol succinate (TOPROL-XL) 25 MG 24 hr tablet Take 2 tablets (50 mg total) by mouth daily.  . Multiple Vitamins-Minerals (MULTIVITAMIN ADULT PO) Take 1 tablet by mouth daily.  . nitrofurantoin, macrocrystal-monohydrate, (MACROBID) 100 MG capsule Take 1 capsule (100 mg total) by mouth 2 (two) times daily.  Marland Kitchen nystatin (MYCOSTATIN/NYSTOP) powder Apply topically 2 (two) times daily.  . pantoprazole (PROTONIX) 40 MG tablet Take 1 tablet (40 mg total) by mouth 2 (two) times daily.  . polyethylene glycol powder (GLYCOLAX/MIRALAX) powder Take by mouth.  . SitaGLIPtin-MetFORMIN HCl (JANUMET XR) [phone] MG TB24 Take 1 tablet by mouth daily.  Marland Kitchen telmisartan-hydrochlorothiazide (MICARDIS HCT) 80-25 MG tablet Take 1 tablet by mouth daily.  Marland Kitchen venlafaxine XR (EFFEXOR XR) 75 MG 24 hr capsule Take 1 capsule (75 mg total) by mouth daily with breakfast.  . [DISCONTINUED] sitaGLIPtin-metformin (JANUMET) 50-500 MG tablet Take 1 tablet by mouth 2 (two) times daily with a meal.   No facility-administered encounter medications on file as of 07/05/2020.   Time spent with the  patient was approximately 45 minutes. This time included  reviewing progress notes, labs, imaging studies, and discussing plan for follow up.   Follow-up: Return in about 3 months (around 10/05/2020) for health maintenance exam, check HgbA1c - needs FBW at time of visit. she should come fasting. Marland Kitchen   Ronnell Freshwater, NP

## 2020-07-05 NOTE — Telephone Encounter (Signed)
Patient notified and made aware.

## 2020-07-05 NOTE — Telephone Encounter (Signed)
Protonix and Micardis sent to [facility] in [place].   Order placed for Mammogram to [facility].   Please advise patient of this and also advise if there are further medications that need refilling patient needs to provide medication name. Thank you

## 2020-07-05 NOTE — Addendum Note (Signed)
Addended by: Mickel Crow on: 07/05/2020 03:14 PM   Modules accepted: Orders

## 2020-07-05 NOTE — Telephone Encounter (Signed)
Patient would like a referral for a mammogram at the breast care center with [facility]. She also states she needs her medications refilled but she did not specify which ones. Please check with Nira Conn to see which ones. Nira Conn is her former provider I believe.  Please advise, thanks.

## 2020-07-05 NOTE — Addendum Note (Signed)
Addended by: Mickel Crow on: 07/05/2020 03:09 PM   Modules accepted: Orders

## 2020-07-05 NOTE — Patient Instructions (Signed)
Diabetes Mellitus and Nutrition, Adult When you have diabetes, or diabetes mellitus, it is very important to have healthy eating habits because your blood sugar (glucose) levels are greatly affected by what you eat and drink. Eating healthy foods in the right amounts, at about the same times every day, can help you:  Control your blood glucose.  Lower your risk of heart disease.  Improve your blood pressure.  Reach or maintain a healthy weight. What can affect my meal plan? Every person with diabetes is different, and each person has different needs for a meal plan. Your health care provider may recommend that you work with a dietitian to make a meal plan that is best for you. Your meal plan may vary depending on factors such as:  The calories you need.  The medicines you take.  Your weight.  Your blood glucose, blood pressure, and cholesterol levels.  Your activity level.  Other health conditions you have, such as heart or kidney disease. How do carbohydrates affect me? Carbohydrates, also called carbs, affect your blood glucose level more than any other type of food. Eating carbs naturally raises the amount of glucose in your blood. Carb counting is a method for keeping track of how many carbs you eat. Counting carbs is important to keep your blood glucose at a healthy level, especially if you use insulin or take certain oral diabetes medicines. It is important to know how many carbs you can safely have in each meal. This is different for every person. Your dietitian can help you calculate how many carbs you should have at each meal and for each snack. How does alcohol affect me? Alcohol can cause a sudden decrease in blood glucose (hypoglycemia), especially if you use insulin or take certain oral diabetes medicines. Hypoglycemia can be a life-threatening condition. Symptoms of hypoglycemia, such as sleepiness, dizziness, and confusion, are similar to symptoms of having too much  alcohol.  Do not drink alcohol if: ? Your health care provider tells you not to drink. ? You are pregnant, may be pregnant, or are planning to become pregnant.  If you drink alcohol: ? Do not drink on an empty stomach. ? Limit how much you use to:  0-1 drink a day for women.  0-2 drinks a day for men. ? Be aware of how much alcohol is in your drink. In the [place] one drink equals one 12 oz bottle of beer (355 mL), one 5 oz glass of wine (148 mL), or one 1 oz glass of hard liquor (44 mL). ? Keep yourself hydrated with water, diet soda, or unsweetened iced tea.  Keep in mind that regular soda, juice, and other mixers may contain a lot of sugar and must be counted as carbs. What are tips for following this plan? Reading food labels  Start by checking the serving size on the "Nutrition Facts" label of packaged foods and drinks. The amount of calories, carbs, fats, and other nutrients listed on the label is based on one serving of the item. Many items contain more than one serving per package.  Check the total grams (g) of carbs in one serving. You can calculate the number of servings of carbs in one serving by dividing the total carbs by 15. For example, if a food has 30 g of total carbs per serving, it would be equal to 2 servings of carbs.  Check the number of grams (g) of saturated fats and trans fats in one serving. Choose foods that have   a low amount or none of these fats.  Check the number of milligrams (mg) of salt (sodium) in one serving. Most people should limit total sodium intake to less than 2,300 mg per day.  Always check the nutrition information of foods labeled as "low-fat" or "nonfat." These foods may be higher in added sugar or refined carbs and should be avoided.  Talk to your dietitian to identify your daily goals for nutrients listed on the label. Shopping  Avoid buying canned, pre-made, or processed foods. These foods tend to be high in fat, sodium, and added  sugar.  Shop around the outside edge of the grocery store. This is where you will most often find fresh fruits and vegetables, bulk grains, fresh meats, and fresh dairy. Cooking  Use low-heat cooking methods, such as baking, instead of high-heat cooking methods like deep frying.  Cook using healthy oils, such as olive, canola, or sunflower oil.  Avoid cooking with butter, cream, or high-fat meats. Meal planning  Eat meals and snacks regularly, preferably at the same times every day. Avoid going long periods of time without eating.  Eat foods that are high in fiber, such as fresh fruits, vegetables, beans, and whole grains. Talk with your dietitian about how many servings of carbs you can eat at each meal.  Eat 4-6 oz (112-168 g) of lean protein each day, such as lean meat, chicken, fish, eggs, or tofu. One ounce (oz) of lean protein is equal to: ? 1 oz (28 g) of meat, chicken, or fish. ? 1 egg. ?  cup (62 g) of tofu.  Eat some foods each day that contain healthy fats, such as avocado, nuts, seeds, and fish.   What foods should I eat? Fruits Berries. Apples. Oranges. Peaches. Apricots. Plums. Grapes. Mango. Papaya. Pomegranate. Kiwi. Cherries. Vegetables Lettuce. Spinach. Leafy greens, including kale, chard, collard greens, and mustard greens. Beets. Cauliflower. Cabbage. Broccoli. Carrots. Green beans. Tomatoes. Peppers. Onions. Cucumbers. Brussels sprouts. Grains Whole grains, such as whole-wheat or whole-grain bread, crackers, tortillas, cereal, and pasta. Unsweetened oatmeal. Quinoa. Brown or wild rice. Meats and other proteins Seafood. Poultry without skin. Lean cuts of poultry and beef. Tofu. Nuts. Seeds. Dairy Low-fat or fat-free dairy products such as milk, yogurt, and cheese. The items listed above may not be a complete list of foods and beverages you can eat. Contact a dietitian for more information. What foods should I avoid? Fruits Fruits canned with  syrup. Vegetables Canned vegetables. Frozen vegetables with butter or cream sauce. Grains Refined white flour and flour products such as bread, pasta, snack foods, and cereals. Avoid all processed foods. Meats and other proteins Fatty cuts of meat. Poultry with skin. Breaded or fried meats. Processed meat. Avoid saturated fats. Dairy Full-fat yogurt, cheese, or milk. Beverages Sweetened drinks, such as soda or iced tea. The items listed above may not be a complete list of foods and beverages you should avoid. Contact a dietitian for more information. Questions to ask a health care provider  Do I need to meet with a diabetes educator?  Do I need to meet with a dietitian?  What number can I call if I have questions?  When are the best times to check my blood glucose? Where to find more information:  American Diabetes Association: diabetes.org  Academy of Nutrition and Dietetics: www.eatright.org  National Institute of Diabetes and Digestive and Kidney Diseases: www.niddk.nih.gov  Association of Diabetes Care and Education Specialists: www.diabeteseducator.org Summary  It is important to have healthy eating   habits because your blood sugar (glucose) levels are greatly affected by what you eat and drink.  A healthy meal plan will help you control your blood glucose and maintain a healthy lifestyle.  Your health care provider may recommend that you work with a dietitian to make a meal plan that is best for you.  Keep in mind that carbohydrates (carbs) and alcohol have immediate effects on your blood glucose levels. It is important to count carbs and to use alcohol carefully. This information is not intended to replace advice given to you by your health care provider. Make sure you discuss any questions you have with your health care provider. Document Revised: 03/10/2019 Document Reviewed: 03/10/2019 Elsevier Patient Education  2021 Elsevier Inc.  

## 2020-07-06 ENCOUNTER — Encounter: Payer: Self-pay | Admitting: Nurse Practitioner

## 2020-07-06 ENCOUNTER — Ambulatory Visit: Payer: PPO | Admitting: Nurse Practitioner

## 2020-07-06 DIAGNOSIS — E1165 Type 2 diabetes mellitus with hyperglycemia: Secondary | ICD-10-CM

## 2020-07-06 MED ORDER — JANUMET XR 100-1000 MG PO TB24: 1.0000 | ORAL_TABLET | Freq: Every day | ORAL | 1 refills | Status: DC

## 2020-07-06 NOTE — Telephone Encounter (Signed)
Per [employer] and faxing to [phone]. AS, CMA

## 2020-07-08 ENCOUNTER — Telehealth: Payer: Self-pay | Admitting: Nurse Practitioner

## 2020-07-08 NOTE — Progress Notes (Signed)
  Chronic Care Management   Outreach Note  07/08/2020 Name: Lisa Gutierrez MRN: [id number] DOB: 04/28/1950  Referred by: Ronnell Freshwater, NP Reason for referral : No chief complaint on file.   An unsuccessful telephone outreach was attempted today. The patient was referred to the pharmacist for assistance with care management and care coordination.   Follow Up Plan:    Chronic Care Management   Outreach Note  07/08/2020 Name: Lisa Gutierrez MRN: [id number] DOB: 08-Sep-1950  Referred by: Ronnell Freshwater, NP Reason for referral : No chief complaint on file.   An unsuccessful telephone outreach was attempted today. The patient was referred to the pharmacist for assistance with care management and care coordination.   Follow Up Plan:   Carley Perdue [employer] Scheduler

## 2020-07-12 ENCOUNTER — Telehealth (INDEPENDENT_AMBULATORY_CARE_PROVIDER_SITE_OTHER): Payer: PPO | Admitting: Psychiatry

## 2020-07-12 ENCOUNTER — Other Ambulatory Visit: Payer: Self-pay

## 2020-07-12 ENCOUNTER — Encounter (HOSPITAL_COMMUNITY): Payer: Self-pay | Admitting: Psychiatry

## 2020-07-12 ENCOUNTER — Telehealth (HOSPITAL_COMMUNITY): Payer: Self-pay | Admitting: *Deleted

## 2020-07-12 DIAGNOSIS — F3341 Major depressive disorder, recurrent, in partial remission: Secondary | ICD-10-CM

## 2020-07-12 DIAGNOSIS — Z630 Problems in relationship with spouse or partner: Secondary | ICD-10-CM

## 2020-07-12 DIAGNOSIS — F419 Anxiety disorder, unspecified: Secondary | ICD-10-CM | POA: Diagnosis not present

## 2020-07-12 MED ORDER — DESVENLAFAXINE SUCCINATE ER 100 MG PO TB24: 100.0000 mg | ORAL_TABLET | Freq: Every day | ORAL | 1 refills | Status: DC

## 2020-07-12 MED ORDER — ALPRAZOLAM 0.5 MG PO TABS: 0.5000 mg | ORAL_TABLET | Freq: Two times a day (BID) | ORAL | 1 refills | Status: DC | PRN

## 2020-07-12 NOTE — Telephone Encounter (Signed)
I had a long discussing regarding all this with her this morning. I had warned her about the possibility of Pristiq being expensive for her to afford, however, she insisted on it as her pharmacist told her about it. My recommendation is that she stays on her current regimen of Venlafaxine ER 75 mg daily and maintain on that. I can send refills for it. She has tried and failed all the SSRIs and Wellbutrin in the past. All caused her to have somatic side effects.

## 2020-07-12 NOTE — Telephone Encounter (Signed)
VM left for writer stating the Pristique Dr Toy Care ordered for her was too expensive for her to get and asking if the Dr could order her something that didn't make her eat all the time, didn't go thru her liver and didn't make her sweat all the time. Will forward this message to Dr Toy Care.

## 2020-07-12 NOTE — Telephone Encounter (Signed)
Writer called Lisa Gutierrez back with Dr Malachy Moan recommendation to stay on her current med regimen. She agreed to but is also interested in revisiting Wellbutrin, she said she didn't give that one a fair try. For now she will stay on her venalfaxine but call if she needs to change or discuss other options in the future.

## 2020-07-12 NOTE — Progress Notes (Signed)
[place] MD OP Progress Note  Virtual Visit via Video Note  I connected with Lisa Gutierrez on 07/12/20 at 11:10 AM EDT by a video enabled telemedicine application and verified that I am speaking with the correct person using two identifiers.  Location: Patient: Home Provider: Clinic   I discussed the limitations of evaluation and management by telemedicine and the availability of in person appointments. The patient expressed understanding and agreed to proceed.  I provided 20 minutes of non-face-to-face time during this encounter.      07/12/2020 11:19 AM Lisa Gutierrez  MRN:  [id number]  Chief Complaint:  " I am doing okay but still dealing with a lot of negativity."   HPI: Patient stated that she still dealing with a lot of negative remarks from her family members.  She described how her son is not appreciative of everything she does.  She stated that he keeps pointing out any mistakes that she makes.  She then spoke about her husband behaving in a very insulting manner towards her.  She ruminated about everything that he keeps doing to her and how she tries to keep her mouth closed even when it is hard. She then mentioned that she found her PCP and is back to seeing her again.  She informed that her PCP was abruptly moved to a different [place] primary care clinic at [facility].  She is very happy to see her again. She then stated that she considers the Probation officer and the therapist Ms. Margreta Journey as her friends.  She stated that It is unfortunate that she does not have any other good friends. She spoke about how her sister continues to be very difficult towards her and how she is trying to set limits with her.  She stated that she has a regular pharmacist that fills her prescriptions at her pharmacy.  She stated that her pharmacist informed her of Pristiq or desvenlafaxine being an option.  She stated that she has lot of hot flashes and her pharmacist was telling her that  this can work better for that. Writer stated that is a good option however cost may be an issue.  Patient stated that her pharmacist informed her that this medicine is not generic and therefore her cost would not go up considerably. Based on this writer recommended switching from venlafaxine to desvenlafaxine and a prescription for 100 mg daily was sent. Patient stated that she was happy that her pharmacist was very knowledgeable and she was glad that Probation officer agreed with the pharmacist.  She stated that she was not trying to make things difficult for anyone.  Supportive psychotherapy provided during the session.  Visit Diagnosis:    ICD-10-CM   1. MDD (major depressive disorder), recurrent, in partial remission ([place])  F33.41   2. Anxiety  F41.9   3. Marital relationship problem  Z63.0     Past Psychiatric History: depression, anxiety  Past Medical History:  Past Medical History:  Diagnosis Date  . Anxiety   . Depression   . Depression    Phreesia 07/02/2020  . Diabetes mellitus without complication ([place])   . GERD (gastroesophageal reflux disease)   . Hypertension   . IBS (irritable bowel syndrome)   . Neuromuscular disorder [facility])     Past Surgical History:  Procedure Laterality Date  . BREAST EXCISIONAL BIOPSY    . CESAREAN SECTION N/A    Phreesia 07/02/2020  . CHOLECYSTECTOMY    . EXCISION / BIOPSY BREAST / NIPPLE /  DUCT Right 1973   duct removed  . SPINE SURGERY N/A    Phreesia 07/02/2020  . TUBAL LIGATION N/A    Phreesia 07/02/2020    Family Psychiatric History: denied  Family History:  Family History  Problem Relation Age of Onset  . Diabetes Mother   . Hypertension Mother   . Coronary artery disease Father   . Glaucoma Father   . Breast cancer Neg Hx     Social History:  Social History   Socioeconomic History  . Marital status: Married    Spouse name: Not on file  . Number of children: Not on file  . Years of education: Not on file  . Highest  education level: Not on file  Occupational History  . Not on file  Tobacco Use  . Smoking status: Former Research scientist (life sciences)  . Smokeless tobacco: Never Used  Substance and Sexual Activity  . Alcohol use: No    Alcohol/week: 0.0 standard drinks  . Drug use: Never  . Sexual activity: Not Currently    Partners: Male  Other Topics Concern  . Not on file  Social History Narrative  . Not on file   Social Determinants of Health   Financial Resource Strain: Not on file  Food Insecurity: Not on file  Transportation Needs: Not on file  Physical Activity: Not on file  Stress: Not on file  Social Connections: Not on file    Allergies:  Allergies  Allergen Reactions  . Aspirin Other (See Comments)    GI upset  . Ibuprofen Other (See Comments)    GERD  . Levofloxacin Other (See Comments)    Weight gain  . Meloxicam Other (See Comments)    GI upset  . Morphine Other (See Comments)  . Naprosyn  [Naproxen] Other (See Comments)    GI upset  . Nsaids Other (See Comments)    GI upset  . Quinolones   . Robinul  [Glycopyrrolate] Nausea And Vomiting  . Penicillin V Potassium Rash  . Sulfa Antibiotics Rash    Metabolic Disorder Labs: Lab Results  Component Value Date   HGBA1C 7.1 (A) 07/05/2020   No results found for: PROLACTIN No results found for: CHOL, TRIG, HDL, CHOLHDL, VLDL, LDLCALC No results found for: TSH  Therapeutic Level Labs: No results found for: LITHIUM No results found for: VALPROATE No components found for:  CBMZ  Current Medications: Current Outpatient Medications  Medication Sig Dispense Refill  . acetaminophen (TYLENOL) 500 MG tablet Take 2 tablets by mouth as needed.    Marland Kitchen acetic acid-hydrocortisone (VOSOL-HC) OTIC solution Place 3 drops into both ears 3 (three) times daily. 10 mL 0  . ALPRAZolam (XANAX) 0.5 MG tablet Take 1 tablet (0.5 mg total) by mouth 2 (two) times daily as needed for anxiety. 60 tablet 1  . Calcium Citrate-Vitamin D (CALCIUM + D PO) Take 1  tablet by mouth daily.    . Flaxseed, Linseed, (FLAXSEED OIL PO) Take 1,500 mg by mouth 2 (two) times daily.    Marland Kitchen glucose blood (ACCU-CHEK SMARTVIEW) test strip 1 each by Other route as needed for other. Use as instructed 200 each 11  . hydrocortisone (PROCTOZONE-HC) 2.5 % rectal cream Place 1 application rectally 2 (two) times daily. 30 g 0  . LINZESS 145 MCG CAPS capsule Take 1 tab po daily 30 capsule 3  . meclizine (ANTIVERT) 25 MG tablet Take 1 tablet (25 mg total) by mouth 2 (two) times daily as needed for dizziness. 60 tablet 1  .  metoprolol succinate (TOPROL-XL) 25 MG 24 hr tablet Take 2 tablets (50 mg total) by mouth daily. 180 tablet 1  . Multiple Vitamins-Minerals (MULTIVITAMIN ADULT PO) Take 1 tablet by mouth daily.    Marland Kitchen nystatin (MYCOSTATIN/NYSTOP) powder Apply topically 2 (two) times daily. 60 g 0  . pantoprazole (PROTONIX) 40 MG tablet Take 1 tablet (40 mg total) by mouth 2 (two) times daily. 180 tablet 1  . polyethylene glycol powder (GLYCOLAX/MIRALAX) powder Take by mouth.    . SitaGLIPtin-MetFORMIN HCl (JANUMET XR) [phone] MG TB24 Take 1 tablet by mouth daily. 90 tablet 1  . telmisartan-hydrochlorothiazide (MICARDIS HCT) 80-25 MG tablet Take 1 tablet by mouth daily. 90 tablet 1  . venlafaxine XR (EFFEXOR XR) 75 MG 24 hr capsule Take 1 capsule (75 mg total) by mouth daily with breakfast. 30 capsule 1   No current facility-administered medications for this visit.      Psychiatric Specialty Exam: ROS  There were no vitals taken for this visit.There is no height or weight on file to calculate BMI.  General Appearance: Fairly groomed  Eye Contact: good  Speech:  Clear and Coherent and Normal Rate  Volume:  Normal  Mood: anxious  Affect:  Congruent  Thought Process:  Goal Directed, Linear and Descriptions of Associations: Intact  Orientation:  Full (Time, Place, and Person)  Thought Content: Logical and Rumination   Suicidal Thoughts:  No  Homicidal Thoughts:  No   Memory:  Recent;   Good Remote;   Good  Judgement:  Fair  Insight:  Fair  Psychomotor Activity:  Normal  Concentration:  Concentration: Good and Attention Span: Good  Recall:  Good  Fund of Knowledge: Good  Language: Good  Akathisia:  Negative  Handed:  Right  AIMS (if indicated): not done  Assets:  Communication Skills Desire for Improvement Financial Resources/Insurance Housing  ADL's:  Intact  Cognition: WNL  Sleep:  Fair   Screenings: Mini-Mental   Flowsheet Row Clinical Support from 02/12/2020 in [facility] [place] from 02/06/2018 in [facility]  Total Score (max 30 points ) 29 30    PHQ2-9   [place] Visit from 07/05/2020 in [place] at [place] from 06/29/2020 in [place] from 02/12/2020 in [facility], [facility] Office Visit from 01/11/2020 in [facility] Office Visit from 11/03/2019 in [facility]  PHQ-2 Total Score 0 3 3 1  0  PHQ-9 Total Score 1 14 15  -- --    Flowsheet Row Counselor from 06/29/2020 in [place] Counselor from 06/08/2020 in [place] No Risk No Risk       Assessment and Plan: Patient remains pretty much the same, ruminating about her husband and other conflictual relationships in her life. She was informed of desvenlafaxine being an option by her pharmacist for her depression and hot flashes at she brought up to the Administrator, Civil Service agreed to switch her to desvenlafaxine.  She was instructed to discontinue venlafaxine and take Pristiq the next day. Potential side effects of medication and risks vs benefits of treatment vs non-treatment were explained and discussed. All questions were answered. Patient verbalized understanding.    1. MDD (major depressive disorder), recurrent, in partial  remission (HCC)  -Discontinue venlafaxine and switch to desvenlafaxine that can optimally help with depression symptoms as well as with hot flashes. - desvenlafaxine (PRISTIQ) 100 MG 24  hr tablet; Take 1 tablet (100 mg total) by mouth daily.  Dispense: 30 tablet; Refill: 1  2. Anxiety  - ALPRAZolam (XANAX) 0.5 MG tablet; Take 1 tablet (0.5 mg total) by mouth 2 (two) times daily as needed for anxiety.  Dispense: 60 tablet; Refill: 1  3. Marital relationship problem    Much supportive therapy provided during the session. Continue therapy with Ms. Kandice Moos. F/up in 6 weeks.   [place], MD 07/12/2020,

## 2020-07-13 ENCOUNTER — Other Ambulatory Visit: Payer: Self-pay | Admitting: Nurse Practitioner

## 2020-07-13 ENCOUNTER — Telehealth: Payer: Self-pay | Admitting: Nurse Practitioner

## 2020-07-13 NOTE — Progress Notes (Signed)
  Chronic Care Management   Outreach Note  07/13/2020 Name: Angenette Daily MRN: [id number] DOB: February 21, 1951  Referred by: Ronnell Freshwater, NP Reason for referral : No chief complaint on file.   A second unsuccessful telephone outreach was attempted today. The patient was referred to pharmacist for assistance with care management and care coordination.  Follow Up Plan:   Carley Perdue [employer] Scheduler

## 2020-07-18 ENCOUNTER — Encounter: Payer: Self-pay | Admitting: Nurse Practitioner

## 2020-07-20 ENCOUNTER — Ambulatory Visit: Payer: PPO | Admitting: Licensed Clinical Social Worker

## 2020-07-21 DIAGNOSIS — M1712 Unilateral primary osteoarthritis, left knee: Secondary | ICD-10-CM | POA: Diagnosis not present

## 2020-07-25 ENCOUNTER — Ambulatory Visit: Payer: PPO

## 2020-07-27 ENCOUNTER — Telehealth: Payer: Self-pay | Admitting: Nurse Practitioner

## 2020-07-27 DIAGNOSIS — M65311 Trigger thumb, right thumb: Secondary | ICD-10-CM | POA: Diagnosis not present

## 2020-07-27 NOTE — Progress Notes (Signed)
  Chronic Care Management   Outreach Note  07/27/2020 Name: Lisa Gutierrez MRN: [id number] DOB: [birth year]  Referred by: Ronnell Freshwater, NP Reason for referral : No chief complaint on file.   Third unsuccessful telephone outreach was attempted today. The patient was referred to the pharmacist for assistance with care management and care coordination.   Follow Up Plan:   Carley Perdue [employer] Scheduler

## 2020-07-28 ENCOUNTER — Telehealth: Payer: Self-pay | Admitting: Nurse Practitioner

## 2020-07-28 NOTE — Progress Notes (Signed)
  Chronic Care Management   Outreach Note  07/28/2020 Name: Lisa Gutierrez MRN: [id number] DOB: 09/15/1950  Referred by: Ronnell Freshwater, NP Reason for referral : No chief complaint on file.   Third unsuccessful telephone outreach was attempted today. The patient was referred to the pharmacist for assistance with care management and care coordination.   Follow Up Plan:   Carley Perdue [employer] Scheduler

## 2020-08-09 ENCOUNTER — Other Ambulatory Visit: Payer: Self-pay

## 2020-08-09 ENCOUNTER — Ambulatory Visit (INDEPENDENT_AMBULATORY_CARE_PROVIDER_SITE_OTHER): Payer: PPO | Admitting: Licensed Clinical Social Worker

## 2020-08-09 DIAGNOSIS — F331 Major depressive disorder, recurrent, moderate: Secondary | ICD-10-CM | POA: Diagnosis not present

## 2020-08-09 DIAGNOSIS — F419 Anxiety disorder, unspecified: Secondary | ICD-10-CM

## 2020-08-09 NOTE — Progress Notes (Signed)
Virtual Visit via Video Note  I connected with Lisa Gutierrez on 08/09/20 at 10:00 AM EDT by a video enabled telemedicine application and verified that I am speaking with the correct person using two identifiers.  Location: Patient: home Provider: remote office [place], [place])   I discussed the limitations of evaluation and management by telemedicine and the availability of in person appointments. The patient expressed understanding and agreed to proceed.   I discussed the assessment and treatment plan with the patient. The patient was provided an opportunity to ask questions and all were answered. The patient agreed with the plan and demonstrated an understanding of the instructions.   The patient was advised to call back or seek an in-person evaluation if the symptoms worsen or if the condition fails to improve as anticipated.  I provided 60 minutes of non-face-to-face time during this encounter.   Lisa Vitolo R Ariann Khaimov, LCSW    THERAPIST PROGRESS NOTE  Session Time: 10-11a  Participation Level: Active  Behavioral Response: CasualAlertDepressed  Type of Therapy: Individual Therapy  Treatment Goals addressed: Anxiety and Coping  Interventions: CBT, Reframing and Other: trauma focused  Summary: Lisa Gutierrez is a 70 y.o. female who presents with symptoms consistent with depression and anxiety. Pt reports that overall mood fluctuates depending on external situations. Pt reports that anxiety/stress also fluctuates depending on the situation.   Allowed pt to explore and express thoughts and feelings associated with recent stressors. Pt states that she is continuing to be in chronic pain most days of the week. "its just getting unbearable".   Pt states that she recently had hand surgery and injection in knee. Pt feels that psychiatric meds are making her gain weight, which is not helpful to her physically. "Now I can't walk my dogs anymore".   Pt reports that she  was very emotional after her cleaning lady decided that she would stop communicating with her "I can't do all this by myself".   Allowed pt to identify several emotional triggers and reviewed emotion regulation.   Continued recommendations are as follows: self care behaviors, positive social engagements, focusing on overall work/home/life balance, and focusing on positive physical and emotional wellness.    Suicidal/Homicidal: Pt admits that she has had thoughts with no plans or intent to follow through  Therapist Response: Lisa Gutierrez is being intentional about setting positive goals for the future and identifying changes that she would like to make for herself. Lisa Gutierrez is able to identify traumas from the past and process through thoughts and feelings associated with trauma. Lisa Gutierrez is continuing to experience sadness in session while discussing the disappointment related to the loss and pain from the past. This is evidence of growth and taking steps towards progress. Treatment to continue as indicated.   Plan: Return again in 3 weeks.  Diagnosis: Axis I: MDD, recurrent, moderate. GAD.    Axis II: No diagnosis    Lisa Bo Jessly Lebeck, LCSW 08/09/2020

## 2020-08-11 ENCOUNTER — Other Ambulatory Visit: Payer: Self-pay | Admitting: Nurse Practitioner

## 2020-08-15 ENCOUNTER — Ambulatory Visit: Payer: PPO | Admitting: Nurse Practitioner

## 2020-08-18 ENCOUNTER — Ambulatory Visit: Payer: PPO | Admitting: Nurse Practitioner

## 2020-08-18 DIAGNOSIS — L570 Actinic keratosis: Secondary | ICD-10-CM | POA: Diagnosis not present

## 2020-08-18 DIAGNOSIS — L281 Prurigo nodularis: Secondary | ICD-10-CM | POA: Diagnosis not present

## 2020-08-18 DIAGNOSIS — D485 Neoplasm of uncertain behavior of skin: Secondary | ICD-10-CM | POA: Diagnosis not present

## 2020-08-18 DIAGNOSIS — X32XXXA Exposure to sunlight, initial encounter: Secondary | ICD-10-CM | POA: Diagnosis not present

## 2020-08-18 DIAGNOSIS — Z872 Personal history of diseases of the skin and subcutaneous tissue: Secondary | ICD-10-CM | POA: Diagnosis not present

## 2020-08-18 DIAGNOSIS — S90561A Insect bite (nonvenomous), right ankle, initial encounter: Secondary | ICD-10-CM | POA: Diagnosis not present

## 2020-08-21 ENCOUNTER — Other Ambulatory Visit (HOSPITAL_COMMUNITY): Payer: Self-pay | Admitting: Psychiatry

## 2020-08-21 DIAGNOSIS — F419 Anxiety disorder, unspecified: Secondary | ICD-10-CM

## 2020-08-21 DIAGNOSIS — F3341 Major depressive disorder, recurrent, in partial remission: Secondary | ICD-10-CM

## 2020-08-22 ENCOUNTER — Encounter: Payer: Self-pay | Admitting: Nurse Practitioner

## 2020-08-22 ENCOUNTER — Telehealth: Payer: Self-pay

## 2020-08-22 ENCOUNTER — Other Ambulatory Visit: Payer: Self-pay | Admitting: Nurse Practitioner

## 2020-08-22 ENCOUNTER — Ambulatory Visit (INDEPENDENT_AMBULATORY_CARE_PROVIDER_SITE_OTHER): Payer: PPO | Admitting: Nurse Practitioner

## 2020-08-22 VITALS — Wt 235.0 lb

## 2020-08-22 DIAGNOSIS — E1165 Type 2 diabetes mellitus with hyperglycemia: Secondary | ICD-10-CM

## 2020-08-22 DIAGNOSIS — Z1231 Encounter for screening mammogram for malignant neoplasm of breast: Secondary | ICD-10-CM

## 2020-08-22 NOTE — Telephone Encounter (Signed)
This pt need to be reschedule if possible this Thursday if not any day will be ok  thanks

## 2020-08-22 NOTE — Progress Notes (Deleted)
Virtual Visit via Telephone Note  I connected with Lisa Gutierrez on 08/22/20 at 10:30 AM EDT by telephone and verified that I am speaking with the correct person using two identifiers.  Location: Patient: home. Provider:    I discussed the limitations, risks, security and privacy concerns of performing an evaluation and management service by telephone and the availability of in person appointments. I also discussed with the patient that there may be a patient responsible charge related to this service. The patient expressed understanding and agreed to proceed.   History of Present Illness:    Observations/Objective:   Assessment and Plan:   Follow Up Instructions:    I discussed the assessment and treatment plan with the patient. The patient was provided an opportunity to ask questions and all were answered. The patient agreed with the plan and demonstrated an understanding of the instructions.   The patient was advised to call back or seek an in-person evaluation if the symptoms worsen or if the condition fails to improve as anticipated.  I provided *** minutes of non-face-to-face time during this encounter.   Ronnell Freshwater, NP

## 2020-08-22 NOTE — Telephone Encounter (Signed)
Patient scheduled for Thursday

## 2020-08-24 ENCOUNTER — Telehealth (HOSPITAL_COMMUNITY): Payer: PPO | Admitting: Psychiatry

## 2020-08-25 ENCOUNTER — Ambulatory Visit: Payer: PPO | Admitting: Nurse Practitioner

## 2020-08-27 ENCOUNTER — Other Ambulatory Visit: Payer: Self-pay | Admitting: Internal Medicine

## 2020-08-27 DIAGNOSIS — B372 Candidiasis of skin and nail: Secondary | ICD-10-CM

## 2020-08-29 ENCOUNTER — Telehealth: Payer: Self-pay | Admitting: Nurse Practitioner

## 2020-08-29 ENCOUNTER — Other Ambulatory Visit: Payer: Self-pay | Admitting: Internal Medicine

## 2020-08-29 ENCOUNTER — Other Ambulatory Visit: Payer: Self-pay

## 2020-08-29 DIAGNOSIS — B372 Candidiasis of skin and nail: Secondary | ICD-10-CM

## 2020-08-29 MED ORDER — NYSTATIN 100000 UNIT/GM EX POWD: Freq: Two times a day (BID) | CUTANEOUS | 0 refills | Status: DC

## 2020-08-30 ENCOUNTER — Ambulatory Visit (INDEPENDENT_AMBULATORY_CARE_PROVIDER_SITE_OTHER): Payer: PPO | Admitting: Licensed Clinical Social Worker

## 2020-08-30 ENCOUNTER — Other Ambulatory Visit: Payer: Self-pay

## 2020-08-30 DIAGNOSIS — F331 Major depressive disorder, recurrent, moderate: Secondary | ICD-10-CM

## 2020-08-30 DIAGNOSIS — F419 Anxiety disorder, unspecified: Secondary | ICD-10-CM

## 2020-08-30 DIAGNOSIS — Z630 Problems in relationship with spouse or partner: Secondary | ICD-10-CM | POA: Diagnosis not present

## 2020-08-30 NOTE — Progress Notes (Signed)
Virtual Visit via Video Note  I connected with Lisa Gutierrez on 08/30/20 at 10:00 AM EDT by a video enabled telemedicine application and verified that I am speaking with the correct person using two identifiers.  Location: Patient: home Provider: remote office [place], [place])   I discussed the limitations of evaluation and management by telemedicine and the availability of in person appointments. The patient expressed understanding and agreed to proceed.   I discussed the assessment and treatment plan with the patient. The patient was provided an opportunity to ask questions and all were answered. The patient agreed with the plan and demonstrated an understanding of the instructions.   The patient was advised to call back or seek an in-person evaluation if the symptoms worsen or if the condition fails to improve as anticipated.  I provided 60 minutes of non-face-to-face time during this encounter.   Lisa Gutierrez Lisa Pria Klosinski, LCSW    THERAPIST PROGRESS NOTE  Session Time: 10-11a Participation Level: Active  Behavioral Response: Neat and Well GroomedAlertAnxious and Depressed  Type of Therapy: Individual Therapy  Treatment Goals addressed: Anxiety and Coping  Interventions: CBT and Supportive  Summary: Lisa Gutierrez is a 70 y.o. female who presents with symptoms associated with depression.  Pt reports that she often feels hyperemotional and cries easily. Pt states that she easily feels like her thoughts are preoccupied with animals.  Allowed pt safe place to explore and express thoughts and feelings related to recent external stressors--pt is upset about granddaughter refusing to take her somewhere because "she was pissed".  Unclear whether gdaughter was upset at pt or about another situation. Pt took it as a disrespectful act of defiance. "I would never have treated my grandmother this way".  Used CBT to allow pt to examine her thoughts and projections of expectations  onto others. Discussed limits of behavior control--just ourselves.   Allowed pt to process through trauma from past and encouraged pt to discuss positive memories from childhood and throughout lifespan as well.   Continued recommendations are as follows: self care behaviors, positive social engagements, focusing on overall work/home/life balance, and focusing on positive physical and emotional wellness.   Suicidal/Homicidal: No  Therapist Response: Lisa Gutierrez is being intentional about setting positive goals for the future and identifying changes that she would like to make for herself. Lisa Gutierrez is able to identify traumas from the past and process through thoughts and feelings associated with trauma. Lisa Gutierrez is continuing to experience sadness in session while discussing the disappointment related to the loss and pain from the past. This is evidence of growth and taking steps towards progress. Treatment to continue as indicated.  Plan: Return again in weeks.  Diagnosis: Axis I: MDD, recurrent; GAD    Axis II: No diagnosis    [place], LCSW 08/30/2020

## 2020-09-01 ENCOUNTER — Encounter: Payer: Self-pay | Admitting: Nurse Practitioner

## 2020-09-01 ENCOUNTER — Ambulatory Visit (INDEPENDENT_AMBULATORY_CARE_PROVIDER_SITE_OTHER): Payer: PPO | Admitting: Nurse Practitioner

## 2020-09-01 VITALS — Wt 235.0 lb

## 2020-09-01 DIAGNOSIS — E1165 Type 2 diabetes mellitus with hyperglycemia: Secondary | ICD-10-CM | POA: Diagnosis not present

## 2020-09-01 DIAGNOSIS — F3341 Major depressive disorder, recurrent, in partial remission: Secondary | ICD-10-CM | POA: Diagnosis not present

## 2020-09-01 DIAGNOSIS — I10 Essential (primary) hypertension: Secondary | ICD-10-CM

## 2020-09-01 DIAGNOSIS — F411 Generalized anxiety disorder: Secondary | ICD-10-CM

## 2020-09-01 MED ORDER — JANUMET 50-1000 MG PO TABS: 1.0000 | ORAL_TABLET | Freq: Two times a day (BID) | ORAL | 1 refills | Status: DC

## 2020-09-01 MED ORDER — CANAGLIFLOZIN 100 MG PO TABS: 300.0000 mg | ORAL_TABLET | Freq: Every day | ORAL | 3 refills | Status: DC

## 2020-09-01 MED ORDER — CANAGLIFLOZIN 100 MG PO TABS: 100.0000 mg | ORAL_TABLET | Freq: Every day | ORAL | 3 refills | Status: DC

## 2020-09-01 NOTE — Progress Notes (Signed)
Virtual Visit via Telephone Note  I connected with Lisa Gutierrez on 09/01/20 at  1:10 PM EDT by telephone and verified that I am speaking with the correct person using two identifiers.  Location: Patient: home Provider: [place] primary care at [facility]   I discussed the limitations, risks, security and privacy concerns of performing an evaluation and management service by telephone and the availability of in person appointments. I also discussed with the patient that there may be a patient responsible charge related to this service. The patient expressed understanding and agreed to proceed.   History of Present Illness: The patient states that since changing Janumet to Janumet XR 100/1000 mg, her blood sugars have not been doing well. States that her blood sugars are now running in the 160s. Changed prescription when HgbA1c was 7.1 at her last visit. She tolerates the change well, but blood sugars have not improved.  Patient sees psychiatry for treatment of depression and anxiety. Takes venlafaxine. Feels like it makes her hungry which could be contributing to elevated blood sugars. She will speak with psychiatrist about this.  She denies chest pain, chest pressure, shortness or breath, or headaches. She denies nausea, vomiting, or changes in bowel or bladder habits.    Observations/Objective:  The patient is alert and oriented. She is pleasant and answers all questions appropriately. Breathing is non-labored. She is in no acute distress at this time.   Assessment and Plan: 1. Type 2 diabetes mellitus with hyperglycemia, without long-term current use of insulin ([place]) Per patient, blood sugar has been running elevated since changing dose Janumet to Janumet XR. Will go back to Janumet 50/1000mg  taking twice daily. Will add invokana 100mg  daily. Continue to limit carbohydrates and sugar in the diet and incorporate exercise into daily routine. Monitor blood sugars daily. Will check  HgbA1c at next in office visit.  - sitaGLIPtin-metformin (JANUMET) 50-1000 MG tablet; Take 1 tablet by mouth 2 (two) times daily with a meal.  Dispense: 180 tablet; Refill: 1 - canagliflozin (INVOKANA) 100 MG TABS tablet; Take 1 tablet (100 mg total) by mouth daily before breakfast.  Dispense: 30 tablet; Refill: 3  2. Essential hypertension Stable. Continue bp medication as prescribed   3. GAD (generalized anxiety disorder) Continue medications as prescribed and follow up visits with psychiatry as scheduled. Advised her to discuss side effects of venlafaxine with psychiatrist at next visit.   4. MDD (major depressive disorder), recurrent, in partial remission ([place]) Continue medications as prescribed and follow up visits with psychiatry as scheduled. Advised her to discuss side effects of venlafaxine with psychiatrist at next visit.    Follow Up Instructions:    I discussed the assessment and treatment plan with the patient. The patient was provided an opportunity to ask questions and all were answered. The patient agreed with the plan and demonstrated an understanding of the instructions.   The patient was advised to call back or seek an in-person evaluation if the symptoms worsen or if the condition fails to improve as anticipated.  I provided 25 minutes of non-face-to-face time during this encounter.   Ronnell Freshwater, NP

## 2020-09-01 NOTE — Patient Instructions (Signed)
Hyperglycemia Hyperglycemia is when the sugar (glucose) level in your blood is too high. High blood sugar can happen to people who have or do not have diabetes. High blood sugar can happen quickly. It can be an emergency. What are the causes? If you have diabetes, high blood sugar may be caused by:  Medicines that increase blood sugar or affect your control of diabetes.  Getting less physical activity.  Overeating.  Being sick or injured or having an infection.  Having surgery.  Stress.  Not giving yourself enough insulin (if you are taking it). You may have high blood sugar because you have diabetes that has not been diagnosed yet. If you do not have diabetes, high blood sugar may be caused by:  Certain medicines.  Stress.  A bad illness.  An infection.  Having surgery.  Diseases of the pancreas. What increases the risk? This condition is more likely to develop in people who have risk factors for diabetes, such as:  Having a family member with diabetes.  Certain conditions in which the body's defense system (immune system) attacks itself. These are called autoimmune disorders.  Being overweight.  Not being active.  Having a condition called insulin resistance.  Having a history of: ? Prediabetes. ? Diabetes when pregnant. ? Polycystic ovarian syndrome (PCOS). What are the signs or symptoms? This condition may not cause symptoms. If you do have symptoms, they may include:  Feeling more thirsty than normal.  Needing to pee (urinate) more often than normal.  Hunger.  Feeling very tired.  Blurry eyesight (vision). You may get other symptoms as the condition gets worse, such as:  Dry mouth.  Pain in your belly (abdomen).  Not being hungry (loss of appetite).  Breath that smells fruity.  Weakness.  Weight loss that is not planned.  A tingling or numb feeling in your hands or feet.  A headache.  Cuts or bruises that heal slowly. How is this  treated? Treatment depends on the cause of your condition. Treatment may include:  Taking medicine to control your blood sugar levels.  Changing your medicine or dosage if you take insulin or other diabetes medicines.  Lifestyle changes. These may include: ? Exercising more. ? Eating healthier foods. ? Losing weight.  Treating an illness or infection.  Checking your blood sugar more often.  Stopping or reducing steroid medicines. If your condition gets very bad, you will need to be treated in the hospital. Follow these instructions at home: General instructions  Take over-the-counter and prescription medicines only as told by your doctor.  Do not smoke or use any products that contain nicotine or tobacco. If you need help quitting, ask your doctor.  If you drink alcohol: ? Limit how much you have to:  0-1 drink a day for women who are not pregnant.  0-2 drinks a day for men. ? Know how much alcohol is in a drink. In the [place] one drink equals one 12 oz bottle of beer (355 mL), one 5 oz glass of wine (148 mL), or one 1 oz glass of hard liquor (44 mL).  Manage stress. If you need help with this, ask your doctor.  Do exercises as told by your doctor.  Keep all follow-up visits. Eating and drinking  Stay at a healthy weight.  Make sure you drink enough fluid when you: ? Exercise. ? Get sick. ? Are in hot temperatures.  Drink enough fluid to keep your pee (urine) pale yellow.   If you  have diabetes:  Know the symptoms of high blood sugar.  Follow your diabetes management plan as told by your doctor. Make sure you: ? Take insulin and medicines as told. ? Follow your exercise plan. ? Follow your meal plan. Eat on time. Do not skip meals. ? Check your blood sugar as often as told. Make sure you check before and after exercise. If you exercise longer or in a different way, check your blood sugar more often. ? Follow your sick day plan whenever you cannot eat or drink  normally. Make this plan ahead of time with your doctor.  Share your diabetes management plan with people in your workplace, school, and household.  Check your pee for ketones when you are ill and as told by your doctor.  Carry a card or wear jewelry that says that you have diabetes.   Where to find more information American Diabetes Association: www.diabetes.org Contact a doctor if:  Your blood sugar level is at or above 240 mg/dL (13.3 mmol/L) for 2 days in a row.  You have problems keeping your blood sugar in your target range.  You have high blood pressure often.  You have signs of illness, such as: ? Feeling like you may vomit (feeling nauseous). ? Vomiting. ? A fever. Get help right away if:  Your blood sugar monitor reads "high" even when you are taking insulin.  You have trouble breathing.  You have a change in how you think, feel, or act (mental status).  You feel like you may vomit, and the feeling does not go away.  You cannot stop vomiting. These symptoms may be an emergency. Get medical help right away. Call your local emergency services (911 in the [place]  Do not wait to see if the symptoms will go away.  Do not drive yourself to the hospital. Summary  Hyperglycemia is when the sugar (glucose) level in your blood is too high.  High blood sugar can happen to people who have or do not have diabetes.  Make sure you drink enough fluids and follow your meal plan. Exercise as often as told by your doctor.  Contact your doctor if you have problems keeping your blood sugar in your target range. This information is not intended to replace advice given to you by your health care provider. Make sure you discuss any questions you have with your health care provider. Document Revised: 01/15/2020 Document Reviewed: 01/15/2020 Elsevier Patient Education  2021 [employer].

## 2020-09-02 ENCOUNTER — Telehealth: Payer: Self-pay | Admitting: Nurse Practitioner

## 2020-09-02 NOTE — Telephone Encounter (Signed)
Pt contacted office stating Invokana too expensive at $90 for 30 day supply. She is requesting a different medication be sent to pharmacy. I advised patient to contact her insurance company to inquire what medication equivalent to [facility] they will cover. Pt verbalized understanding and was agreeable.   Pt request a msg be sent to [facility] to advise of this issue. Pt is aware Nira Conn is out of the office at this time and she may not have a response until next week. AS, CMA

## 2020-09-05 ENCOUNTER — Telehealth: Payer: Self-pay | Admitting: Nurse Practitioner

## 2020-09-05 ENCOUNTER — Other Ambulatory Visit: Payer: Self-pay | Admitting: Nurse Practitioner

## 2020-09-05 DIAGNOSIS — E1165 Type 2 diabetes mellitus with hyperglycemia: Secondary | ICD-10-CM

## 2020-09-05 MED ORDER — CANAGLIFLOZIN 300 MG PO TABS: 300.0000 mg | ORAL_TABLET | Freq: Every day | ORAL | 3 refills | Status: DC

## 2020-09-05 NOTE — Progress Notes (Signed)
Pt is in a donut hole and no medications are covered. Pt is unable to afford the Invokana. Can we prescribe something either more cost efficient or what would you like [place] to do?

## 2020-09-05 NOTE — Telephone Encounter (Signed)
Pt in donut hole and can not afford Invokana.   I got a savings card from [employer], contacted pharmacy to see if savings card would work and it did not.  Contacted pts insurance to see what medication equivalent to Invokana they would cover, their only option was Jardiance but that would still be $140 out of pocket for the patient. Their suggestion was for patient to contact [place] to inquire about financial assistance with medication costs or to contact social security and request financial aid from them.   Patient is aware of the above and verbalized understanding. Pt going to reach out to [place] (provider contact number) and social security office. AS, CMA

## 2020-09-05 NOTE — Telephone Encounter (Signed)
Can we get patient assistance from the manufacturer? She gets that from [employer] for her [place]. Maybe makers of invokana would do the same for her.

## 2020-09-05 NOTE — Progress Notes (Signed)
I resent invokana as 300mg  tablet. Looks like that is the dose her insurance prefers.  I would like for her to take 1/2 tablet daily. Sent [facility] to [facility] today.

## 2020-09-05 NOTE — Telephone Encounter (Signed)
Patient called called again on 09-05-20.     Pt contacted office stating Invokana too expensive at $90 for 30 day supply. She is requesting a different medication be sent to pharmacy. I advised patient to contact her insurance company to inquire what medication equivalent to [facility] they will cover. Pt verbalized understanding and was agreeable.   Pt request a msg be sent to [facility] to advise of this issue. Pt is aware Nira Conn is out of the office at this time and she may not have a response until next week. AS, CMA

## 2020-09-05 NOTE — Telephone Encounter (Signed)
Error

## 2020-09-05 NOTE — Telephone Encounter (Signed)
Pt is aware of the below and verbalized understanding. AS, CMA 

## 2020-09-05 NOTE — Telephone Encounter (Signed)
Hey. I resent invokana as 300mg  tablet. Looks like that is the dose her insurance prefers.  I would like for her to take 1/2 tablet daily. [facility]. Can you let her know I did this and we can see how much it costs for the higher dose? I ust want her to take 1/2 tablet though. Thanks so much.

## 2020-09-06 NOTE — Telephone Encounter (Signed)
Great! Thank you so much!

## 2020-09-06 NOTE — Telephone Encounter (Signed)
She is reaching out to them today.

## 2020-09-13 ENCOUNTER — Telehealth: Payer: Self-pay | Admitting: Nurse Practitioner

## 2020-09-13 NOTE — Telephone Encounter (Signed)
Patient requested to speak to you about the side effects of Invokana and what possible problems might arise from using it. Please advise, thanks.

## 2020-09-13 NOTE — Telephone Encounter (Signed)
Patient advised of medication side effects. Pt verbalized understanding and states she is going to get the medication today. AS, CMA

## 2020-09-15 ENCOUNTER — Telehealth: Payer: Self-pay | Admitting: Nurse Practitioner

## 2020-09-15 NOTE — Telephone Encounter (Signed)
Ok thanks 

## 2020-09-15 NOTE — Telephone Encounter (Signed)
Left voicemail for patient making her aware.

## 2020-09-15 NOTE — Telephone Encounter (Signed)
Called patient she can take half of the [place] Thanks

## 2020-09-15 NOTE — Telephone Encounter (Signed)
Thank you :)

## 2020-09-15 NOTE — Telephone Encounter (Signed)
Patient is advised and aware to take half of her Invokana

## 2020-09-15 NOTE — Telephone Encounter (Signed)
Patient would like to know about her Invokana if she needs to take half a tablet or a whole tablet. Please advise, thanks.

## 2020-09-15 NOTE — Telephone Encounter (Signed)
Patient is returning your call,thanks. °

## 2020-09-20 ENCOUNTER — Encounter (HOSPITAL_COMMUNITY): Payer: Self-pay | Admitting: Psychiatry

## 2020-09-20 ENCOUNTER — Telehealth (INDEPENDENT_AMBULATORY_CARE_PROVIDER_SITE_OTHER): Payer: PPO | Admitting: Psychiatry

## 2020-09-20 ENCOUNTER — Other Ambulatory Visit: Payer: Self-pay

## 2020-09-20 DIAGNOSIS — F419 Anxiety disorder, unspecified: Secondary | ICD-10-CM | POA: Diagnosis not present

## 2020-09-20 DIAGNOSIS — Z630 Problems in relationship with spouse or partner: Secondary | ICD-10-CM

## 2020-09-20 DIAGNOSIS — F3341 Major depressive disorder, recurrent, in partial remission: Secondary | ICD-10-CM | POA: Diagnosis not present

## 2020-09-20 MED ORDER — VENLAFAXINE HCL ER 75 MG PO CP24: 75.0000 mg | ORAL_CAPSULE | Freq: Every day | ORAL | 1 refills | Status: DC

## 2020-09-20 MED ORDER — ALPRAZOLAM 0.5 MG PO TABS
0.5000 mg | ORAL_TABLET | Freq: Two times a day (BID) | ORAL | 1 refills | Status: DC | PRN
Start: 2020-09-20 — End: 2020-11-23

## 2020-09-20 NOTE — Progress Notes (Signed)
[place] MD OP Progress Note  Virtual Visit via Video Note  I connected with Lisa Gutierrez on 09/20/20 at 10:00 AM EDT by a video enabled telemedicine application and verified that I am speaking with the correct person using two identifiers.  Location: Patient: Home Provider: Clinic   I discussed the limitations of evaluation and management by telemedicine and the availability of in person appointments. The patient expressed understanding and agreed to proceed.  I provided 26 minutes of non-face-to-face time during this encounter.      09/20/2020 10:44 AM Lisa Gutierrez  MRN:  [id number]  Chief Complaint:  " Are you leaving me ?"   HPI: Patient spent most of the session discussing about how she is going to miss with the writer after the writer leaves.  Patient stated that she will miss the writer a lot however she wishes Probation officer the best.  She cried out loudly because of this. She then mentioned that she has suffered a few injuries in the past month.  She stated that she fell yesterday after tripping over and injured her leg and her face.  She stated that she got no support or empathy from her husband.  She stated that she was told that it was her fault that she got injured. She also informed that she got her shoulder injury few weeks ago and the orthopedic surgeon has recommended surgery however she has told the surgeon that there is no way she can undergo the procedure because of lack of support. She is trying to undergo the physical therapy to deal with the pain for now. She kept ruminating about things that have happened in the past.  She stated that she has no support from her sons or daughter-in-laws  She kept stating that she knows she will be fine although she will miss the writer a lot.  Writer provided her with a lot of supportive therapy and encouraged her to focus on her health.  Patient was advised to continue same regimen for now.   Visit Diagnosis:     ICD-10-CM   1. MDD (major depressive disorder), recurrent, in partial remission (HCC)  F33.41 venlafaxine XR (EFFEXOR-XR) 75 MG 24 hr capsule  2. Anxiety  F41.9 ALPRAZolam (XANAX) 0.5 MG tablet    venlafaxine XR (EFFEXOR-XR) 75 MG 24 hr capsule  3. Marital relationship problem  Z63.0     Past Psychiatric History: depression, anxiety  Past Medical History:  Past Medical History:  Diagnosis Date  . Anxiety   . Depression   . Depression    Phreesia 07/02/2020  . Diabetes mellitus without complication ([place])   . GERD (gastroesophageal reflux disease)   . Hypertension   . IBS (irritable bowel syndrome)   . Neuromuscular disorder [facility])     Past Surgical History:  Procedure Laterality Date  . BREAST EXCISIONAL BIOPSY    . CESAREAN SECTION N/A    Phreesia 07/02/2020  . CHOLECYSTECTOMY    . EXCISION / BIOPSY BREAST / NIPPLE / DUCT Right 1973   duct removed  . SPINE SURGERY N/A    Phreesia 07/02/2020  . TUBAL LIGATION N/A    Phreesia 07/02/2020    Family Psychiatric History: denied  Family History:  Family History  Problem Relation Age of Onset  . Diabetes Mother   . Hypertension Mother   . Coronary artery disease Father   . Glaucoma Father   . Breast cancer Neg Hx     Social History:  Social History  Socioeconomic History  . Marital status: Married    Spouse name: Not on file  . Number of children: Not on file  . Years of education: Not on file  . Highest education level: Not on file  Occupational History  . Not on file  Tobacco Use  . Smoking status: Former Research scientist (life sciences)  . Smokeless tobacco: Never Used  Substance and Sexual Activity  . Alcohol use: No    Alcohol/week: 0.0 standard drinks  . Drug use: Never  . Sexual activity: Not Currently    Partners: Male  Other Topics Concern  . Not on file  Social History Narrative  . Not on file   Social Determinants of Health   Financial Resource Strain: Not on file  Food Insecurity: Not on file  Transportation  Needs: Not on file  Physical Activity: Not on file  Stress: Not on file  Social Connections: Not on file    Allergies:  Allergies  Allergen Reactions  . Aspirin Other (See Comments)    GI upset  . Ibuprofen Other (See Comments)    GERD  . Levofloxacin Other (See Comments)    Weight gain  . Meloxicam Other (See Comments)    GI upset  . Morphine Other (See Comments)  . Naprosyn  [Naproxen] Other (See Comments)    GI upset  . Nsaids Other (See Comments)    GI upset  . Quinolones   . Robinul  [Glycopyrrolate] Nausea And Vomiting  . Penicillin V Potassium Rash  . Sulfa Antibiotics Rash    Metabolic Disorder Labs: Lab Results  Component Value Date   HGBA1C 7.1 (A) 07/05/2020   No results found for: PROLACTIN No results found for: CHOL, TRIG, HDL, CHOLHDL, VLDL, LDLCALC No results found for: TSH  Therapeutic Level Labs: No results found for: LITHIUM No results found for: VALPROATE No components found for:  CBMZ  Current Medications: Current Outpatient Medications  Medication Sig Dispense Refill  . acetaminophen (TYLENOL) 500 MG tablet Take 2 tablets by mouth as needed.    . ALPRAZolam (XANAX) 0.5 MG tablet Take 1 tablet (0.5 mg total) by mouth 2 (two) times daily as needed for anxiety. 60 tablet 1  . Calcium Citrate-Vitamin D (CALCIUM + D PO) Take 1 tablet by mouth daily. (Patient not taking: Reported on 09/01/2020)    . canagliflozin (INVOKANA) 300 MG TABS tablet Take 1 tablet (300 mg total) by mouth daily before breakfast. 30 tablet 3  . Flaxseed, Linseed, (FLAXSEED OIL PO) Take 1,500 mg by mouth 2 (two) times daily.    Marland Kitchen glucose blood (ACCU-CHEK SMARTVIEW) test strip 1 each by Other route as needed for other. Use as instructed 200 each 11  . LINZESS 145 MCG CAPS capsule TAKE 1 CAPSULE BY MOUTH EVERY DAY 90 capsule 0  . meclizine (ANTIVERT) 25 MG tablet Take 1 tablet (25 mg total) by mouth 2 (two) times daily as needed for dizziness. 60 tablet 1  . metoprolol succinate  (TOPROL-XL) 25 MG 24 hr tablet Take 2 tablets (50 mg total) by mouth daily. 180 tablet 1  . Multiple Vitamins-Minerals (MULTIVITAMIN ADULT PO) Take 1 tablet by mouth daily.    Marland Kitchen nystatin (MYCOSTATIN/NYSTOP) powder Apply topically 2 (two) times daily. 60 g 0  . pantoprazole (PROTONIX) 40 MG tablet Take 1 tablet (40 mg total) by mouth 2 (two) times daily. 180 tablet 1  . sitaGLIPtin-metformin (JANUMET) 50-1000 MG tablet Take 1 tablet by mouth 2 (two) times daily with a meal. 180 tablet 1  .  telmisartan-hydrochlorothiazide (MICARDIS HCT) 80-25 MG tablet Take 1 tablet by mouth daily. 90 tablet 1  . venlafaxine XR (EFFEXOR-XR) 75 MG 24 hr capsule Take 1 capsule (75 mg total) by mouth daily with breakfast. 30 capsule 1   No current facility-administered medications for this visit.      Psychiatric Specialty Exam: ROS  There were no vitals taken for this visit.There is no height or weight on file to calculate BMI.  General Appearance: Fairly groomed  Eye Contact: Good  Speech:  Clear and Coherent and Normal Rate  Volume:  Normal  Mood: anxious  Affect:  Congruent, tearful  Thought Process:  Goal Directed, Linear and Descriptions of Associations: Intact  Orientation:  Full (Time, Place, and Person)  Thought Content: Logical and Rumination   Suicidal Thoughts:  No  Homicidal Thoughts:  No  Memory:  Recent;   Good Remote;   Good  Judgement:  Fair  Insight:  Fair  Psychomotor Activity:  Normal  Concentration:  Concentration: Good and Attention Span: Good  Recall:  Good  Fund of Knowledge: Good  Language: Good  Akathisia:  Negative  Handed:  Right  AIMS (if indicated): not done  Assets:  Communication Skills Desire for Improvement Financial Resources/Insurance Housing  ADL's:  Intact  Cognition: WNL  Sleep:  Fair   Screenings: Mini-Mental   Flowsheet Row Clinical Support from 02/12/2020 in [facility], [place] from 02/06/2018 in [facility],  [facility]  Total Score (max 30 points ) 29 30    PHQ2-9   [place] Visit from 08/22/2020 in [place] at [facility] Visit from 07/05/2020 in [place] at [place] from 06/29/2020 in [place] from 02/12/2020 in [facility], [facility] Office Visit from 01/11/2020 in [facility], [facility]  PHQ-2 Total Score 2 0 3 3 1   PHQ-9 Total Score 17 1 14 15  --    Flowsheet Row Counselor from 06/29/2020 in [place] Counselor from 06/08/2020 in [place] No Risk No Risk       Assessment and Plan: Patient is currently somewhat distraught due to the writer leaving the practice.  Writer provided her with a lot of reassurance and encouraged her to focus on her health.   1. Anxiety  - ALPRAZolam (XANAX) 0.5 MG tablet; Take 1 tablet (0.5 mg total) by mouth 2 (two) times daily as needed for anxiety.  Dispense: 60 tablet; Refill: 1 - venlafaxine XR (EFFEXOR-XR) 75 MG 24 hr capsule; Take 1 capsule (75 mg total) by mouth daily with breakfast.  Dispense: 30 capsule; Refill: 1  2. MDD (major depressive disorder), recurrent, in partial remission (HCC)  - venlafaxine XR (EFFEXOR-XR) 75 MG 24 hr capsule; Take 1 capsule (75 mg total) by mouth daily with breakfast.  Dispense: 30 capsule; Refill: 1  3. Marital relationship problem   Much supportive therapy provided during the session. Continue therapy with Ms. Kandice Moos. F/up in 2 months.  Patient is aware that her care is being transferred to a different provider due to the writer leaving office.   [place] Crane, MD 09/20/2020,

## 2020-09-21 ENCOUNTER — Telehealth (HOSPITAL_COMMUNITY): Payer: PPO | Admitting: Psychiatry

## 2020-09-26 ENCOUNTER — Telehealth: Payer: Self-pay | Admitting: Nurse Practitioner

## 2020-09-26 NOTE — Telephone Encounter (Signed)
Patient would like to ask some questions about Invokana before her appointment Monday. Please advise, thanks.

## 2020-09-26 NOTE — Telephone Encounter (Signed)
Please let her know that she did the right thing by stopping the medication. We have an appointment scheduled for next week and we can discuss other options at that point. Thanks.

## 2020-09-27 NOTE — Telephone Encounter (Signed)
I thought I responded to this yesterday? She did the right thing and we will talk about other therapies at her appointment. Definitely.

## 2020-09-28 ENCOUNTER — Ambulatory Visit (INDEPENDENT_AMBULATORY_CARE_PROVIDER_SITE_OTHER): Payer: PPO | Admitting: Licensed Clinical Social Worker

## 2020-09-28 ENCOUNTER — Other Ambulatory Visit: Payer: Self-pay

## 2020-09-28 DIAGNOSIS — F419 Anxiety disorder, unspecified: Secondary | ICD-10-CM | POA: Diagnosis not present

## 2020-09-28 DIAGNOSIS — F331 Major depressive disorder, recurrent, moderate: Secondary | ICD-10-CM

## 2020-09-28 NOTE — Progress Notes (Signed)
Virtual Visit via Video Note  I connected with Lisa Gutierrez on 09/28/20 at  1:00 PM EDT by a video enabled telemedicine application and verified that I am speaking with the correct person using two identifiers.  Location: Patient: home Provider: [facility]   I discussed the limitations of evaluation and management by telemedicine and the availability of in person appointments. The patient expressed understanding and agreed to proceed.  I discussed the assessment and treatment plan with the patient. The patient was provided an opportunity to ask questions and all were answered. The patient agreed with the plan and demonstrated an understanding of the instructions.   The patient was advised to call back or seek an in-person evaluation if the symptoms worsen or if the condition fails to improve as anticipated.  I provided 60 minutes of non-face-to-face time during this encounter.   Ravneet Spilker R Siria Calandro, LCSW   THERAPIST PROGRESS NOTE  Session Time: 1-2p  Participation Level: Active  Behavioral Response: Neat and Well GroomedAlertDepressed  Type of Therapy: Family Therapy  Treatment Goals addressed: Anxiety and Diagnosis: depression  Interventions: CBT and DBT  Summary: Lisa Gutierrez is a 70 y.o. female who presents with symptoms consistent with depression and anxiety.  Pt reports that mood has been fluctuating depending on situational triggers.  Allowed pt to explore and express thoughts and feelings associated with recent life situations and external stressors. Pt was emotional about the departure of Dr. Toy Care and reports that she cried about it for a long time. Allowed pt safe space to explore feelings.  Discussed family-related triggers. Pt especially triggered by a situation that happened at granddaughter's graduation. Explored relationships with sons and granddaughters. Pt feels disrespected by grand's behaviors and is taking it personally. Discussed developmental  stages of grandchildren at this time.   Reviewed automatic negative thoughts and thought distortions. Pt very upset so didn't have her trigger her own thoughts at time of session. Utilized time to help de-escalate and calm pt.  Continued recommendations are as follows: self care behaviors, positive social engagements, focusing on overall work/home/life balance, and focusing on positive physical and emotional wellness.    Suicidal/Homicidal: No  Therapist Response:   [name] is being intentional about setting positive goals for the future and identifying changes that she would like to make for herself. [name] is able to identify traumas from the past and process through thoughts and feelings associated with trauma. [name] is continuing to experience sadness in session while discussing the disappointment related to the loss and pain from the past. This is evidence of growth and taking steps towards progress. Treatment to continue as indicated.    Plan: Return again in 4 weeks.  Diagnosis: Axis I: MDD, recurrent, moderate    Axis II: No diagnosis    Lisa Bo Sofija Antwi, LCSW 09/28/2020

## 2020-10-03 ENCOUNTER — Encounter: Payer: Self-pay | Admitting: Nurse Practitioner

## 2020-10-03 ENCOUNTER — Ambulatory Visit (INDEPENDENT_AMBULATORY_CARE_PROVIDER_SITE_OTHER): Payer: PPO | Admitting: Nurse Practitioner

## 2020-10-03 VITALS — Ht 66.0 in | Wt 235.0 lb

## 2020-10-03 DIAGNOSIS — Z6837 Body mass index (BMI) 37.0-37.9, adult: Secondary | ICD-10-CM | POA: Diagnosis not present

## 2020-10-03 DIAGNOSIS — E1165 Type 2 diabetes mellitus with hyperglycemia: Secondary | ICD-10-CM

## 2020-10-03 NOTE — Progress Notes (Signed)
Virtual Visit via Telephone Note  I connected with Lisa Gutierrez on 10/04/20 at  8:50 AM EDT by telephone and verified that I am speaking with the correct person using two identifiers.  Location: Patient: home Provider: [facility]     I discussed the limitations, risks, security and privacy concerns of performing an evaluation and management service by telephone and the availability of in person appointments. I also discussed with the patient that there may be a patient responsible charge related to this service. The patient expressed understanding and agreed to proceed.   History of Present Illness: The patient was started on invokna at her most recent visit with me at a dose of 150mg  daily. After starting this medication, she started to develop joint and muscle pain, similar to that of starting a statin for high cholesterol. She states she did have a fall, bruising her knee and shoulder. Initially, she blamed the joint and muscle pain on the fall, but after she recovered from the fall, the muscle aches and joint pain just continued to get worse and started to effect more joints and muscles. She states that the longer she took the invokana, the worse the joint and muscle pain became. She states that she has stopped taking the medication. She is now taking Janumet 50/1000mg  twice daily. She is paying very close attention to her intake of carbohydrates and sugar. She is trying to increase her protein intake and drink plenty of water. She states that she is gradually doing better.    Observations/Objective:   The patient is alert and oriented. She is pleasant and answers all questions appropriately. Breathing is non-labored. She is in no acute distress at this time.    Today's Vitals   10/03/20 0837  Weight: 235 lb (106.6 kg)  Height: 5\' 6"  (1.676 m)   Body mass index is 37.93 kg/m.   Assessment and Plan: 1. Type 2 diabetes mellitus with hyperglycemia,  without long-term current use of insulin (HCC) Last check of HgbA1c was done in 06/2020 and was 7.1. she was started on invokana 150mg  and had very negative side effects which she associates with taking this medication. Advised her to stop this medicaion, which she has already done. She is to take Janumet 50/1000mg  twice daily and limit carbohydrate and sugar intake and increase her protein and water intake.   2. Body mass index (BMI) of 37.0-37.9 in adult Will refer to [facility] lifestyle center to help her manage diet and healthy lifestyle changes she can make to help control blood sugar without changing medications further.  -- Referral to Nutrition and Diabetes Services   Follow Up Instructions:    I discussed the assessment and treatment plan with the patient. The patient was provided an opportunity to ask questions and all were answered. The patient agreed with the plan and demonstrated an understanding of the instructions.   The patient was advised to call back or seek an in-person evaluation if the symptoms worsen or if the condition fails to improve as anticipated.  I provided 25 minutes of non-face-to-face time during this encounter.   Ronnell Freshwater, NP

## 2020-10-04 DIAGNOSIS — Z6837 Body mass index (BMI) 37.0-37.9, adult: Secondary | ICD-10-CM | POA: Insufficient documentation

## 2020-10-04 DIAGNOSIS — E669 Obesity, unspecified: Secondary | ICD-10-CM | POA: Insufficient documentation

## 2020-10-10 ENCOUNTER — Other Ambulatory Visit: Payer: Self-pay | Admitting: Nurse Practitioner

## 2020-10-10 ENCOUNTER — Telehealth: Payer: Self-pay | Admitting: Nurse Practitioner

## 2020-10-10 DIAGNOSIS — B379 Candidiasis, unspecified: Secondary | ICD-10-CM

## 2020-10-10 MED ORDER — FLUCONAZOLE 150 MG PO TABS: ORAL_TABLET | ORAL | 0 refills | Status: DC

## 2020-10-10 NOTE — Progress Notes (Signed)
[facility] [address]

## 2020-10-10 NOTE — Telephone Encounter (Signed)
Patient has a yeast infection and took fluconazole and it was out date. She states she needs a new prescription.  Please advise, thanks.

## 2020-10-10 NOTE — Telephone Encounter (Signed)
Please let her know that I [facility] [address].. thanks  -HB

## 2020-10-10 NOTE — Telephone Encounter (Signed)
Called pt she is aware of her new rx and the pharmacy it was sent to

## 2020-10-12 DIAGNOSIS — L281 Prurigo nodularis: Secondary | ICD-10-CM | POA: Diagnosis not present

## 2020-10-14 ENCOUNTER — Ambulatory Visit: Payer: PPO

## 2020-11-07 ENCOUNTER — Other Ambulatory Visit: Payer: Self-pay | Admitting: Nurse Practitioner

## 2020-11-07 DIAGNOSIS — B372 Candidiasis of skin and nail: Secondary | ICD-10-CM

## 2020-11-09 ENCOUNTER — Ambulatory Visit (INDEPENDENT_AMBULATORY_CARE_PROVIDER_SITE_OTHER): Payer: PPO | Admitting: Licensed Clinical Social Worker

## 2020-11-09 ENCOUNTER — Other Ambulatory Visit: Payer: Self-pay

## 2020-11-09 DIAGNOSIS — F419 Anxiety disorder, unspecified: Secondary | ICD-10-CM

## 2020-11-09 DIAGNOSIS — F331 Major depressive disorder, recurrent, moderate: Secondary | ICD-10-CM

## 2020-11-09 NOTE — Progress Notes (Addendum)
Virtual Visit via Video Note  I connected with Lisa Gutierrez on 11/09/20 at  2:00 PM EDT by a video enabled telemedicine application and verified that I am speaking with the correct person using two identifiers.  Location: Patient: home Provider: [facility]   I discussed the limitations of evaluation and management by telemedicine and the availability of in person appointments. The patient expressed understanding and agreed to proceed.  I discussed the assessment and treatment plan with the patient. The patient was provided an opportunity to ask questions and all were answered. The patient agreed with the plan and demonstrated an understanding of the instructions.   The patient was advised to call back or seek an in-person evaluation if the symptoms worsen or if the condition fails to improve as anticipated.  I provided 60 minutes of non-face-to-face time during this encounter.   Lisa Gutierrez R Lisa Boese, LCSW   THERAPIST PROGRESS NOTE  Session Time: 2-3p  Participation Level: Active  Behavioral Response: Neat and Well GroomedAlertAnxious  Type of Therapy: Individual Therapy  Treatment Goals addressed: Anxiety  Interventions: CBT and Other: trauma focused  Summary: Lisa Gutierrez is a 70 y.o. female who presents with continuing symptoms related to depression diagnosis. Patient reports that overall mood is stable and that patient is managing stress and anxiety as best as she can at this point in time. Allowed patients a space to explore and express thoughts and feelings associated with recent external stressors, and current life events. Explored patient's relationship with husband, and specific triggers that happen in their conversations and arguments. Explored patients relationships with other family members, and how other family members trigger patient. Explored childhood trauma, and discussed psychological impact of trauma from the past. Discussed trauma at length, and  allowed patient to identify thoughts, feelings, and current behavior patterns fueled by past trauma. Patient reports that she continues to have negative feelings about self, and overall self worth. Patient reports that she has a rash on her arms and legs that she scratch is when she is stressed. Patients PCP report said this rash is fueled from stress and anxiety, and recommended that patient reach out to her psychiatric provider for medication adjustment. Patient reports that she has an appointment with two different psychiatric providers, so patient will have appointment with the first one and discuss whether the second appointment is needed. Continued recommendations are as follows: self care behaviors, positive social engagements, focusing on overall work/home/life balance, and focusing on positive physical and emotional wellness.    Suicidal/Homicidal: No  Therapist Response: Lisa Gutierrez is being intentional about setting positive goals for the future and identifying changes that she would like to make for herself. Lisa Gutierrez is able to identify traumas from the past and process through thoughts and feelings associated with trauma. Lisa Gutierrez is continuing to experience sadness in session while discussing the disappointment related to the loss and pain from the past. This is evidence of growth and taking steps towards progress. Treatment to continue as indicated.    Plan: Return again in 4 weeks.  Diagnosis: Axis I: MDD, recurrent, moderate; GAD    Axis II: No diagnosis    Lisa Bo Mica [place], LCSW 11/09/2020

## 2020-11-11 ENCOUNTER — Other Ambulatory Visit: Payer: Self-pay | Admitting: Nurse Practitioner

## 2020-11-16 ENCOUNTER — Other Ambulatory Visit: Payer: Self-pay

## 2020-11-16 ENCOUNTER — Telehealth: Payer: Self-pay | Admitting: Nurse Practitioner

## 2020-11-16 DIAGNOSIS — K219 Gastro-esophageal reflux disease without esophagitis: Secondary | ICD-10-CM

## 2020-11-16 MED ORDER — PANTOPRAZOLE SODIUM 40 MG PO TBEC: 40.0000 mg | DELAYED_RELEASE_TABLET | Freq: Two times a day (BID) | ORAL | 1 refills | Status: DC

## 2020-11-16 NOTE — Telephone Encounter (Signed)
Patient needs a [facility]ill on [facility] [address] [facility] [place], thanks.

## 2020-11-16 NOTE — Progress Notes (Signed)
Virtual Visit via Video Note  I connected with Lisa Gutierrez on 11/23/20 at 11:00 AM EDT by a video enabled telemedicine application and verified that I am speaking with the correct person using two identifiers.  Location: Patient: home Provider: office Persons participated in the visit- patient, provider    I discussed the limitations of evaluation and management by telemedicine and the availability of in person appointments. The patient expressed understanding and agreed to proceed.   I discussed the assessment and treatment plan with the patient. The patient was provided an opportunity to ask questions and all were answered. The patient agreed with the plan and demonstrated an understanding of the instructions.   The patient was advised to call back or seek an in-person evaluation if the symptoms worsen or if the condition fails to improve as anticipated.  I provided 30 minutes of non-face-to-face time during this encounter.   Lisa Clay, MD    [facility] MD/PA/NP OP Progress Note  11/23/2020 11:40 AM Lisa Gutierrez  MRN:  [id number]  Chief Complaint:  Chief Complaint   Follow-up; Depression    HPI:  Lisa Gutierrez is a 70 y.o. year old female with a history of depression, hypertension, diabetes, GERD, who presents for follow up appointment for below.   She states that she is not doing well.  She was helping for her granddaughter, who goes to college.  There was "episode happened "on that day.  Her 2 grandchildren, who are teenagers were disrespectful to her.  She returned home to be away from everybody.  She states that her husband and her son has been "mean and hateful."  She believes that her grandchildren took that [place] from her son.  She reports her frustration that nobody see that aspect but her.  She states that her husband has no empathy or sympathy.  She denies any physical abuse or safety concern.  Although she was recommended to have a knee  surgery, she does not have anybody to take care of her.  She states that nobody cares for her.  However, on further elaboration, she states that she has good connection with her granddaughter, although she tries not to bother her now that she is going to college.  She does not have any friends, stating that she does not trust them.  She is planning to talk with a nutritionist to work on her diet.  She is also trying to reach out to her orthopedic provider so that she can do some physical activity.  She has insomnia.  She feels depressed.  She has been eating more, although she is unsure of weight.  She denies SI.  She denies alcohol use or drug use.  She takes Xanax only at night for sleep.  She is willing to try higher dose of venlafaxine.   Mania- denies decreased need for sleep, euphoria, hallucinations  Daily routine: takes care of her dogs, cats, house chores Exercise: unable to do due to knee pain Employment:  Support: none (talks with her oldest granddaughter, who has left to college) Household: husband Marital status: married for 13 years  Number of children: 2 sons.   Wt Readings from Last 3 Encounters:  10/03/20 235 lb (106.6 kg)  09/01/20 235 lb (106.6 kg)  08/22/20 235 lb (106.6 kg)     Visit Diagnosis:    ICD-10-CM   1. MDD (major depressive disorder), recurrent episode, moderate (HCC)  F33.1     2. Anxiety  F41.9 ALPRAZolam (XANAX) 0.5  MG tablet      Past Psychiatric History:  Outpatient: [facility] Psychiatry admission: denies Previous suicide attempt: denies Past trials of medication: sertraline, citalopram, lexapro, fluoxetine  History of violence:    Past Medical History:  Past Medical History:  Diagnosis Date   Anxiety    Depression    Depression    Phreesia 07/02/2020   Diabetes mellitus without complication (HCC)    GERD (gastroesophageal reflux disease)    Hypertension    IBS (irritable bowel syndrome)    Neuromuscular disorder [facility])     Past Surgical  History:  Procedure Laterality Date   BREAST EXCISIONAL BIOPSY     CESAREAN SECTION N/A    Phreesia 07/02/2020   CHOLECYSTECTOMY     EXCISION / BIOPSY BREAST / NIPPLE / DUCT Right 1973   duct removed   SPINE SURGERY N/A    Phreesia 07/02/2020   TUBAL LIGATION N/A    Phreesia 07/02/2020    Family Psychiatric History: as below  Family History:  Family History  Problem Relation Age of Onset   Diabetes Mother    Hypertension Mother    Coronary artery disease Father    Glaucoma Father    Breast cancer Neg Hx     Social History:  Social History   Socioeconomic History   Marital status: Married    Spouse name: Not on file   Number of children: Not on file   Years of education: Not on file   Highest education level: Not on file  Occupational History   Not on file  Tobacco Use   Smoking status: Former   Smokeless tobacco: Never  Substance and Sexual Activity   Alcohol use: No    Alcohol/week: 0.0 standard drinks   Drug use: Never   Sexual activity: Not Currently    Partners: Male  Other Topics Concern   Not on file  Social History Narrative   Not on file   Social Determinants of Health   Financial Resource Strain: Not on file  Food Insecurity: Not on file  Transportation Needs: Not on file  Physical Activity: Not on file  Stress: Not on file  Social Connections: Not on file    Allergies:  Allergies  Allergen Reactions   Aspirin Other (See Comments)    GI upset   Ibuprofen Other (See Comments)    GERD   Levofloxacin Other (See Comments)    Weight gain   Meloxicam Other (See Comments)    GI upset   Morphine Other (See Comments)   Naprosyn  [Naproxen] Other (See Comments)    GI upset   Nsaids Other (See Comments)    GI upset   Quinolones    Robinul  [Glycopyrrolate] Nausea And Vomiting   Penicillin V Potassium Rash   Sulfa Antibiotics Rash    Metabolic Disorder Labs: Lab Results  Component Value Date   HGBA1C 7.1 (A) 07/05/2020   No results  found for: PROLACTIN No results found for: CHOL, TRIG, HDL, CHOLHDL, VLDL, LDLCALC No results found for: TSH  Therapeutic Level Labs: No results found for: LITHIUM No results found for: VALPROATE No components found for:  CBMZ  Current Medications: Current Outpatient Medications  Medication Sig Dispense Refill   venlafaxine XR (EFFEXOR-XR) 150 MG 24 hr capsule Take 1 capsule (150 mg total) by mouth daily with breakfast. 30 capsule 1   acetaminophen (TYLENOL) 500 MG tablet Take 2 tablets by mouth as needed.     ALPRAZolam (XANAX) 0.5 MG tablet  Take 1 tablet (0.5 mg total) by mouth daily as needed for anxiety. 30 tablet 1   Flaxseed, Linseed, (FLAXSEED OIL PO) Take 1,500 mg by mouth 2 (two) times daily.     fluconazole (DIFLUCAN) 150 MG tablet Take 1 tablet po every other day for 5 doses. 5 tablet 1   glucose blood (ACCU-CHEK SMARTVIEW) test strip 1 each by Other route as needed for other. Use as instructed 200 each 11   LINZESS 145 MCG CAPS capsule TAKE 1 CAPSULE BY MOUTH EVERY DAY 90 capsule 0   meclizine (ANTIVERT) 25 MG tablet Take 1 tablet (25 mg total) by mouth 2 (two) times daily as needed for dizziness. 60 tablet 1   metoprolol succinate (TOPROL-XL) 25 MG 24 hr tablet Take 2 tablets (50 mg total) by mouth daily. 180 tablet 1   Multiple Vitamins-Minerals (MULTIVITAMIN ADULT PO) Take 1 tablet by mouth daily.     nystatin (MYCOSTATIN/NYSTOP) powder APPLY TOPICALLY TWICE A DAY 60 g 2   pantoprazole (PROTONIX) 40 MG tablet Take 1 tablet (40 mg total) by mouth 2 (two) times daily. 180 tablet 1   sitaGLIPtin-metformin (JANUMET) 50-1000 MG tablet Take 1 tablet by mouth 2 (two) times daily with a meal. 180 tablet 1   telmisartan-hydrochlorothiazide (MICARDIS HCT) 80-25 MG tablet Take 1 tablet by mouth daily. 90 tablet 1   venlafaxine XR (EFFEXOR-XR) 75 MG 24 hr capsule Take 1 capsule (75 mg total) by mouth daily with breakfast. 30 capsule 1   No current facility-administered medications for  this visit.     Musculoskeletal: Strength & Muscle Tone:  N/A Gait & Station:  N/A Patient leans: N/A  Psychiatric Specialty Exam: Review of Systems  Psychiatric/Behavioral:  Positive for dysphoric mood and sleep disturbance. Negative for agitation, behavioral problems, confusion, decreased concentration, hallucinations, self-injury and suicidal ideas. The patient is nervous/anxious. The patient is not hyperactive.   All other systems reviewed and are negative.  There were no vitals taken for this visit.There is no height or weight on file to calculate BMI.  General Appearance: Fairly Groomed  Eye Contact:  Good  Speech:  Clear and Coherent  Volume:  Normal  Mood:  Depressed  Affect:  Appropriate, Congruent, and slilghtly tense  Thought Process:  Coherent  Orientation:  Full (Time, Place, and Person)  Thought Content: Logical   Suicidal Thoughts:  No  Homicidal Thoughts:  No  Memory:  Immediate;   Good  Judgement:  Good  Insight:  Good  Psychomotor Activity:  Normal  Concentration:  Concentration: Good and Attention Span: Good  Recall:  Good  Fund of Knowledge: Good  Language: Good  Akathisia:  No  Handed:  Right  AIMS (if indicated): not done  Assets:  Communication Skills Desire for Improvement  ADL's:  Intact  Cognition: WNL  Sleep:  Poor   Screenings: Mini-Mental    Flowsheet Row Clinical Support from 02/12/2020 in [facility], [place] from 02/06/2018 in [facility], [facility]  Total Score (max 30 points ) 29 30      PHQ2-9    Flowsheet Row Counselor from 09/28/2020 in [place] Office Visit from 08/22/2020 in [place] at [place] Visit from 07/05/2020 in [place] at [place] from 06/29/2020 in [place] from 02/12/2020 in [facility], [facility]  PHQ-2 Total Score 2 2 0 3 3  PHQ-9 Total Score  -- '17 1 14 '$ 15  Flowsheet Row Video Visit from 11/23/2020 in [place] Counselor from 09/28/2020 in [place] Counselor from 06/29/2020 in [place] No Risk No Risk No Risk        Assessment and Plan:  Kennede Prosen is a 70 y.o. year old female with a history of depression, hypertension, diabetes, GERD, who presents for follow up appointment for below.   1. Anxiety 2. MDD (major depressive disorder), recurrent episode, moderate ([place]) # r/o PTSD She reports depressive symptoms and anxiety in the context of marital conflict, conflict with her son, and recent laving of her granddaughter for college, who she has good connection with.  Other psychosocial stressors includes knee pain.  Although she reports limited support/social connection, she is willing to work on daily activity to improve her life.  She is willing to try higher dose of venlafaxine.  Noted that although she reports increase in appetite since starting venlafaxine, it is unclear whether it is more attributable to adverse reaction from venlafaxine or ineffective coping skills due to stress.  We uptitrate the dose to target depression and anxiety.  Will continue Xanax as needed for anxiety.   Plan Increase venlafaxine 150 mg daily - monitor weight gain Continue xanax 0.5 mg daily as needed for anxiety (she has been taking this dose) Next appointment- 10/5 at 3 PM for 30 mins, video  The patient demonstrates the following risk factors for suicide: Chronic risk factors for suicide include: psychiatric disorder of depression and chronic pain. Acute risk factors for suicide include: family or marital conflict. Protective factors for this patient include: hope for the future. Considering these factors, the overall suicide risk at this point appears to be low. Patient is appropriate for outpatient follow up.       Lisa Clay, MD 11/23/2020, 11:40 AM

## 2020-11-16 NOTE — Telephone Encounter (Signed)
[facility] [address] [facility] [place]

## 2020-11-21 ENCOUNTER — Telehealth: Payer: Self-pay | Admitting: Nurse Practitioner

## 2020-11-21 ENCOUNTER — Other Ambulatory Visit: Payer: Self-pay

## 2020-11-21 ENCOUNTER — Telehealth (HOSPITAL_COMMUNITY): Payer: PPO | Admitting: Psychiatry

## 2020-11-21 ENCOUNTER — Other Ambulatory Visit: Payer: Self-pay | Admitting: Nurse Practitioner

## 2020-11-21 DIAGNOSIS — B379 Candidiasis, unspecified: Secondary | ICD-10-CM

## 2020-11-21 MED ORDER — FLUCONAZOLE 150 MG PO TABS
ORAL_TABLET | ORAL | 1 refills | Status: DC
Start: 2020-11-21 — End: 2020-12-09

## 2020-11-21 NOTE — Progress Notes (Signed)
Diflucan '150mg'$  prescribed. May take 1 tablet every other day for total of 5 doses. She may repeat this once if needed. [facility]

## 2020-11-21 NOTE — Telephone Encounter (Signed)
Patient has requested a phone call from you. Please call patient. Thanks

## 2020-11-21 NOTE — Telephone Encounter (Signed)
Hey. Please let her know that I did prescription for diflucan '150mg'$ . May take 1 tablet every other day for total of 5 doses. She may repeat this once if needed. [facility] If this continues to be problem, we may want to talk about some other treatments at next visit. thanks

## 2020-11-21 NOTE — Telephone Encounter (Signed)
Called pt she is advised of her Rx that was sent to the pharmacy and her upcoming appt.

## 2020-11-23 ENCOUNTER — Telehealth (INDEPENDENT_AMBULATORY_CARE_PROVIDER_SITE_OTHER): Payer: PPO | Admitting: Psychiatry

## 2020-11-23 ENCOUNTER — Encounter: Payer: Self-pay | Admitting: Psychiatry

## 2020-11-23 ENCOUNTER — Other Ambulatory Visit: Payer: Self-pay

## 2020-11-23 DIAGNOSIS — F331 Major depressive disorder, recurrent, moderate: Secondary | ICD-10-CM

## 2020-11-23 DIAGNOSIS — F419 Anxiety disorder, unspecified: Secondary | ICD-10-CM

## 2020-11-23 MED ORDER — ALPRAZOLAM 0.5 MG PO TABS: 0.5000 mg | ORAL_TABLET | Freq: Every day | ORAL | 1 refills | Status: AC | PRN

## 2020-11-23 MED ORDER — VENLAFAXINE HCL ER 150 MG PO CP24: 150.0000 mg | ORAL_CAPSULE | Freq: Every day | ORAL | 1 refills | Status: DC

## 2020-11-23 NOTE — Patient Instructions (Signed)
Increase venlafaxine 150 mg daily - monitor weight gain Continue xanax 0.5 mg daily as needed for anxiety  Next appointment- 10/5 at 3 PM

## 2020-12-05 ENCOUNTER — Telehealth: Payer: Self-pay | Admitting: Nurse Practitioner

## 2020-12-05 NOTE — Telephone Encounter (Signed)
Patient asking for this, but we had discontinued the metformin aspect of this due to abdominal pain/diarrhea. I know that the change did not help her abdominal symptoms. I can add this back to help better control sugars. I feel like she needs to have visit with check of HgbA1c before changes are made. Thanks

## 2020-12-05 NOTE — Telephone Encounter (Signed)
Patient would like a refill of Janumet which was her original medication. Patient gets it from [facility] [facility] for free.

## 2020-12-06 ENCOUNTER — Telehealth: Payer: Self-pay

## 2020-12-06 NOTE — Telephone Encounter (Signed)
pt called states she wants to speak with you about the venlafaxine. she states she got question if it will make her symptoms worse she she taking double the amount.  She states she doesn't want to gain more weight and that she already sweats so bad that she can't stand it and if she takes more with it make it worse.  She said she didn't know if you wanted to make changes too medication but she wanted to speak with you about it.

## 2020-12-06 NOTE — Telephone Encounter (Signed)
Called her phone due to her request. She does not answer the phone. Voice message was full, and unable to leave a voice message.  Please inform the patient that the hope is that higher dose of venlafaxine would help her for anxiety and depression. We cannot tell whether or not this medication can cause weight gain unless she tries the dose, although that side effect is not so common. Please advise her if she experiences any side effect after starting the higher dose.

## 2020-12-07 ENCOUNTER — Telehealth: Payer: Self-pay | Admitting: Nurse Practitioner

## 2020-12-07 ENCOUNTER — Other Ambulatory Visit: Payer: Self-pay | Admitting: Nurse Practitioner

## 2020-12-07 DIAGNOSIS — I10 Essential (primary) hypertension: Secondary | ICD-10-CM

## 2020-12-07 MED ORDER — METOPROLOL SUCCINATE ER 25 MG PO TB24: 50.0000 mg | ORAL_TABLET | Freq: Every day | ORAL | 1 refills | Status: DC

## 2020-12-07 MED ORDER — METOPROLOL SUCCINATE ER 25 MG PO TB24: 50.0000 mg | ORAL_TABLET | Freq: Every day | ORAL | 0 refills | Status: DC

## 2020-12-07 NOTE — Telephone Encounter (Signed)
Patient would like a refill on metoprolol and [facility] [address] [facility] [place]. Thanks

## 2020-12-07 NOTE — Addendum Note (Signed)
Addended by: Mickel Crow on: 12/07/2020 11:32 AM   Modules accepted: Orders

## 2020-12-07 NOTE — Telephone Encounter (Signed)
Renewed metoprolol ER '25mg'$  - taking two tablets daily - and [facility] [address]

## 2020-12-07 NOTE — Progress Notes (Signed)
Renewed metoprolol ER '25mg'$  - taking two tablets daily - and [facility] [address]

## 2020-12-08 ENCOUNTER — Telehealth: Payer: Self-pay | Admitting: Nurse Practitioner

## 2020-12-08 NOTE — Telephone Encounter (Signed)
Patient has tested positive for COVID via a home test. Symptoms include a fever, headache, body aches, little congestion. Please advise, thanks.

## 2020-12-08 NOTE — Telephone Encounter (Signed)
Please contact patient to schedule for tomorrow morning telehealth at 8am. AS, CMA

## 2020-12-09 ENCOUNTER — Other Ambulatory Visit: Payer: Self-pay

## 2020-12-09 ENCOUNTER — Encounter: Payer: Self-pay | Admitting: Physician Assistant

## 2020-12-09 ENCOUNTER — Ambulatory Visit: Payer: PPO | Admitting: Licensed Clinical Social Worker

## 2020-12-09 ENCOUNTER — Ambulatory Visit (INDEPENDENT_AMBULATORY_CARE_PROVIDER_SITE_OTHER): Payer: PPO | Admitting: Physician Assistant

## 2020-12-09 VITALS — Ht 66.0 in | Wt 235.0 lb

## 2020-12-09 DIAGNOSIS — Z91198 Patient's noncompliance with other medical treatment and regimen for other reason: Secondary | ICD-10-CM

## 2020-12-09 DIAGNOSIS — B379 Candidiasis, unspecified: Secondary | ICD-10-CM

## 2020-12-09 DIAGNOSIS — N83201 Unspecified ovarian cyst, right side: Secondary | ICD-10-CM | POA: Insufficient documentation

## 2020-12-09 DIAGNOSIS — U071 COVID-19: Secondary | ICD-10-CM | POA: Diagnosis not present

## 2020-12-09 MED ORDER — MOLNUPIRAVIR EUA 200MG CAPSULE
4.0000 | ORAL_CAPSULE | Freq: Two times a day (BID) | ORAL | 0 refills | Status: AC
  Filled 2020-12-09: qty 40, 5d supply, fill #0

## 2020-12-09 MED ORDER — FLUCONAZOLE 150 MG PO TABS: ORAL_TABLET | ORAL | 0 refills | Status: DC

## 2020-12-09 NOTE — Patient Instructions (Signed)
10 Things You Can Do to Manage Your COVID-19 Symptoms at Home If you have possible or confirmed COVID-19 Stay home except to get medical care. Monitor your symptoms carefully. If your symptoms get worse, call your healthcare provider immediately. Get rest and stay hydrated. If you have a medical appointment, call the healthcare provider ahead of time and tell them that you have or may have COVID-19. For medical emergencies, call 911 and notify the dispatch personnel that you have or may have COVID-19. Cover your cough and sneezes with a tissue or use the inside of your elbow. Wash your hands often with soap and water for at least 20 seconds or clean your hands with an alcohol-based hand sanitizer that contains at least 60% alcohol. As much as possible, stay in a specific room and away from other people in your home. Also, you should use a separate bathroom, if available. If you need to be around other people in or outside of the home, wear a mask. Avoid sharing personal items with other people in your household, like dishes, towels, and bedding. Clean all surfaces that are touched often, like counters, tabletops, and doorknobs. Use household cleaning sprays or wipes according to the label instructions. cdc.gov/coronavirus 10/30/2019 This information is not intended to replace advice given to you by your health care provider. Make sure you discuss any questions you have with your health care provider. Document Revised: 08/18/2020 Document Reviewed: 08/18/2020 Elsevier Patient Education  2022 [employer].  

## 2020-12-09 NOTE — Progress Notes (Signed)
Pt sick with covid--requests to [facility] due to breathing concerns.

## 2020-12-09 NOTE — Progress Notes (Signed)
Telehealth office visit note for Lisa Reid, PA-C- at Primary Care at [name]   I connected with current patient today by telephone and verified that I am speaking with the correct person    Location of the patient: Home  Location of the provider: Office - This visit type was conducted due to national recommendations for restrictions regarding the COVID-19 Pandemic (e.g. social distancing) in an effort to limit this patient's exposure and mitigate transmission in our community.    - No physical exam could be performed with this format, beyond that communicated to [place] by the patient/ family members as noted.   - Additionally my office staff/ schedulers were to discuss with the patient that there may be a monetary charge related to this service, depending on their medical insurance.  My understanding is that patient understood and consented to proceed.     _________________________________________________________________________________   History of Present Illness: Patient calls in with c/o Covid-19 infection. Patient reports started having symptoms of sneezing, postnasal drainage, general malaise 2-3 days ago and later developed diarrhea, vomiting and fever. Patient tested positive for Covid yesterday. States initially had a low-grade fever which tends to spike at nighttime. Has been taking Tylenol. Unable to tolerate ibuprofen due to GI side effects. Patient reports has had Covid in the past which was treated with antibiotic. Has received two doses and a booster of [place]. Denies chest pain, palpitations, shortness of breath, wheezing or altered mental status. States completed fluconazole and continues to have vaginal symptoms.      GAD 7 : Generalized Anxiety Score 12/09/2020  Nervous, Anxious, on Edge 3  Control/stop worrying 3  Worry too much - different things 3  Trouble relaxing 2  Restless 0  Easily annoyed or irritable 3  Afraid - awful might happen 3  Total GAD 7  Score 17  Anxiety Difficulty Very difficult    Depression screen [facility] 2/9 12/09/2020 08/22/2020 07/05/2020 02/12/2020 01/11/2020  Decreased Interest 3 1 0 2 1  Down, Depressed, Hopeless 2 1 0 1 0  PHQ - 2 Score 5 2 0 3 1  Altered sleeping 3 2 0 1 -  Tired, decreased energy 3 3 0 1 -  Change in appetite 2 2 0 1 -  Feeling bad or failure about yourself  3 3 0 3 -  Trouble concentrating 3 2 0 3 -  Moving slowly or fidgety/restless '2 3 1 2 '$ -  Suicidal thoughts 0 0 0 1 -  PHQ-9 Score '21 17 1 15 '$ -  Difficult doing work/chores Very difficult - - - -  Some encounter information is confidential and restricted. Go to Review Flowsheets activity to see all data.  Some recent data might be hidden      Impression and Recommendations:     1. COVID-19 virus infection   2. Yeast infection     Covid-19 virus infection: -Patient has 4 0000000 risk of complications and is within 5-day window of symptom onset so will start oral antiviral medication with molnupiravir. Discussed with patient potential side effects. Recommend to continue home supportive care and Tylenol as needed for fever. Discussed latest CDC isolation guidelines. Advised to monitor for worsening symptoms.  Yeast infection: -Discussed with patient to take fluconazole x 3 doses. If symptoms fail to improve or worsen recommend to schedule in-person visit for further evaluation.    - As part of my medical decision making, I reviewed the following data within the electronic medical  record:  History obtained from pt /family, CMA notes reviewed and incorporated if applicable, Labs reviewed, Radiograph/ tests reviewed if applicable and OV notes from prior OV's with me, as well as any other specialists she/he has seen since seeing me last, were all reviewed and used in my medical decision making process today.    - Additionally, when appropriate, discussion had with patient regarding our treatment plan, and their biases/concerns about that plan  were used in my medical decision making today.    - The patient agreed with the plan and demonstrated an understanding of the instructions.   No barriers to understanding were identified.     - The patient was advised to call back or seek an in-person evaluation if the symptoms worsen or if the condition fails to improve as anticipated.   Return if symptoms worsen or fail to improve.    No orders of the defined types were placed in this encounter.   Meds ordered this encounter  Medications   molnupiravir EUA 200 mg CAPS    Sig: Take 4 capsules (800 mg total) by mouth 2 (two) times daily for 5 days.    Dispense:  40 capsule    Refill:  0    Order Specific Question:   Supervising Provider    Answer:   Beatrice Lecher D [[id number]]   fluconazole (DIFLUCAN) 150 MG tablet    Sig: Take 1 tablet by mouth every 3 days.    Dispense:  3 tablet    Refill:  0    Order Specific Question:   Supervising Provider    Answer:   Beatrice Lecher D [[id number]]     Medications Discontinued During This Encounter  Medication Reason   fluconazole (DIFLUCAN) 150 MG tablet Error       Time spent on telephone encounter was 12 minutes.   Note:  This note was prepared with assistance of Dragon voice recognition software. Occasional wrong-word or sound-a-like substitutions may have occurred due to the inherent limitations of voice recognition software.    The [place] was signed into law in 2016 which includes the topic of electronic health records.  This provides immediate access to information in MyChart.  This includes consultation notes, operative notes, office notes, lab results and pathology reports.  If you have any questions about what you read please let us know at your next visit or call us at the office.  We are right here with you.   __________________________________________________________________________________     Patient Care Team    Relationship Specialty  Notifications Start End  Ronnell Freshwater, NP PCP - General Family Medicine  12/28/14      -Vitals obtained; medications/ allergies reconciled;  personal medical, social, Sx etc.histories were updated by CMA, reviewed by me and are reflected in chart   Patient Active Problem List   Diagnosis Date Noted   Cyst of right ovary 12/09/2020   Body mass index (BMI) of 37.0-37.9 in adult 10/04/2020   BMI 37.0-37.9, adult 10/03/2020   Encounter to establish care 07/05/2020   Statin intolerance 03/06/2020   Other fatigue 11/04/2019   History of COVID-19 11/04/2019   Vertigo 05/22/2019   Exposure to COVID-19 virus 05/04/2019   Acute recurrent pansinusitis 03/18/2019   Chronic allergic rhinitis 03/18/2019   Irritable bowel syndrome with both constipation and diarrhea 02/25/2019   MDD (major depressive disorder), recurrent episode, moderate ([place]) 01/30/2019   Marital relationship problem 01/30/2019   Acute non-recurrent pansinusitis 08/04/2018  Vaginal candidiasis 08/04/2018   Type 2 diabetes mellitus with hyperglycemia ([place]) 07/02/2018   Urinary tract infection without hematuria 07/02/2018   Acute upper respiratory infection 03/25/2018   Flu-like symptoms 03/25/2018   Cough 03/25/2018   Encounter for general adult medical examination with abnormal findings 02/06/2018   Type 2 diabetes mellitus with hyperglycemia, without long-term current use of insulin ([place]) 02/06/2018   Primary insomnia 02/06/2018   Uncontrolled type 2 diabetes mellitus with hyperglycemia ([place]) 12/06/2017   Essential hypertension 12/06/2017   Cutaneous candidiasis 12/06/2017   GAD (generalized anxiety disorder) 12/06/2017   Gastroesophageal reflux disease without esophagitis 12/06/2017   Dysuria 12/06/2017   Fatty liver disease, nonalcoholic 0000000   Chronic superficial gastritis without bleeding 11/23/2016   External hemorrhoids 11/06/2016   Other constipation 11/06/2016   RUQ pain 11/06/2016     Current  Meds  Medication Sig   acetaminophen (TYLENOL) 500 MG tablet Take 2 tablets by mouth as needed.   ALPRAZolam (XANAX) 0.5 MG tablet Take 1 tablet (0.5 mg total) by mouth daily as needed for anxiety.   Flaxseed, Linseed, (FLAXSEED OIL PO) Take 1,500 mg by mouth 2 (two) times daily.   fluconazole (DIFLUCAN) 150 MG tablet Take 1 tablet by mouth every 3 days.   LINZESS 145 MCG CAPS capsule TAKE 1 CAPSULE BY MOUTH EVERY DAY   meclizine (ANTIVERT) 25 MG tablet Take 1 tablet (25 mg total) by mouth 2 (two) times daily as needed for dizziness.   metoprolol succinate (TOPROL-XL) 25 MG 24 hr tablet Take 2 tablets (50 mg total) by mouth daily.   molnupiravir EUA 200 mg CAPS Take 4 capsules (800 mg total) by mouth 2 (two) times daily for 5 days.   Multiple Vitamins-Minerals (MULTIVITAMIN ADULT PO) Take 1 tablet by mouth daily.   nystatin (MYCOSTATIN/NYSTOP) powder APPLY TOPICALLY TWICE A DAY   pantoprazole (PROTONIX) 40 MG tablet Take 1 tablet (40 mg total) by mouth 2 (two) times daily.   sitaGLIPtin-metformin (JANUMET) 50-1000 MG tablet Take 1 tablet by mouth 2 (two) times daily with a meal.   telmisartan-hydrochlorothiazide (MICARDIS HCT) 80-25 MG tablet Take 1 tablet by mouth daily.   venlafaxine XR (EFFEXOR-XR) 75 MG 24 hr capsule Take 1 capsule (75 mg total) by mouth daily with breakfast.     Allergies:  Allergies  Allergen Reactions   Aspirin Other (See Comments)    GI upset   Ibuprofen Other (See Comments)    GERD   Levofloxacin Other (See Comments)    Weight gain   Meloxicam Other (See Comments)    GI upset   Morphine Other (See Comments)   Naprosyn  [Naproxen] Other (See Comments)    GI upset   Nsaids Other (See Comments)    GI upset   Quinolones    Robinul  [Glycopyrrolate] Nausea And Vomiting   Penicillin V Potassium Rash   Sulfa Antibiotics Rash     ROS:  See above HPI for pertinent positives and negatives   Objective:   Height '5\' 6"'$  (1.676 m), weight 235 lb (106.6 kg).   (if some vitals are omitted, this means that patient was UNABLE to obtain them.) General: A & O * 3; sounds in no acute distress Respiratory: speaking in full sentences, no conversational dyspnea Psych: insight appears good, mood- appears full

## 2020-12-13 ENCOUNTER — Ambulatory Visit: Payer: PPO | Admitting: Licensed Clinical Social Worker

## 2020-12-13 ENCOUNTER — Other Ambulatory Visit: Payer: Self-pay

## 2020-12-15 ENCOUNTER — Ambulatory Visit: Payer: PPO | Admitting: Dietician

## 2020-12-15 ENCOUNTER — Ambulatory Visit: Payer: PPO | Admitting: Nurse Practitioner

## 2020-12-18 ENCOUNTER — Other Ambulatory Visit (HOSPITAL_COMMUNITY): Payer: Self-pay | Admitting: Psychiatry

## 2020-12-18 DIAGNOSIS — F3341 Major depressive disorder, recurrent, in partial remission: Secondary | ICD-10-CM

## 2020-12-18 DIAGNOSIS — F419 Anxiety disorder, unspecified: Secondary | ICD-10-CM

## 2020-12-22 ENCOUNTER — Other Ambulatory Visit: Payer: Self-pay

## 2020-12-22 ENCOUNTER — Other Ambulatory Visit: Payer: Self-pay | Admitting: Psychiatry

## 2020-12-22 ENCOUNTER — Ambulatory Visit (INDEPENDENT_AMBULATORY_CARE_PROVIDER_SITE_OTHER): Payer: PPO | Admitting: Licensed Clinical Social Worker

## 2020-12-22 DIAGNOSIS — F419 Anxiety disorder, unspecified: Secondary | ICD-10-CM

## 2020-12-22 DIAGNOSIS — F3341 Major depressive disorder, recurrent, in partial remission: Secondary | ICD-10-CM

## 2020-12-22 DIAGNOSIS — F331 Major depressive disorder, recurrent, moderate: Secondary | ICD-10-CM

## 2020-12-22 MED ORDER — VENLAFAXINE HCL ER 37.5 MG PO CP24: ORAL_CAPSULE | ORAL | 0 refills | Status: DC

## 2020-12-22 NOTE — Telephone Encounter (Signed)
Received message from her therapist regarding the concerns about her medication.  She states that she is having night sweats when she was started on venlafaxine several months ago .  She is unsure what will be the best for her.  After having discussed the option, she is willing to try a little higher dose of venlafaxine.  She agrees to do the following.  She will contact the office if any worsening in her symptoms.  If that is the case, will consider trying duloxetine.   - Increase venlafaxine 112.5 mg daily

## 2020-12-22 NOTE — Plan of Care (Signed)
Developed plan

## 2020-12-22 NOTE — Progress Notes (Signed)
Virtual Visit via Video Note  I connected with Lisa Gutierrez on 12/22/20 at 10:00 AM EDT by a video enabled telemedicine application and verified that I am speaking with the correct person using two identifiers.  Location: Patient: home Provider: [facility]   I discussed the limitations of evaluation and management by telemedicine and the availability of in person appointments. The patient expressed understanding and agreed to proceed.   I discussed the assessment and treatment plan with the patient. The patient was provided an opportunity to ask questions and all were answered. The patient agreed with the plan and demonstrated an understanding of the instructions.   The patient was advised to call back or seek an in-person evaluation if the symptoms worsen or if the condition fails to improve as anticipated.  I provided 42 minutes of non-face-to-face time during this encounter.   Lisa Kendrick R Sayra Frisby, LCSW   THERAPIST PROGRESS NOTE  Session Time: 10-10:42a  Participation Level: Active  Behavioral Response: Neat and Well GroomedAlertAnxious and Depressed  Type of Therapy: Individual Therapy  Treatment Goals addressed: Coping and Diagnosis: depression  Interventions: CBT, Supportive, and Family Systems  Summary: Lisa Gutierrez is a 70 y.o. female who presents with symptoms consistent with depression and anxiety. Pt reports that overall mood has been fluctuating depending on situational stressors. Pt reports inconsistent quality and quantity of sleep. Pt reports that she has been compliant with medication although she has not started the higher dose of effexor as was recommended by psychiatrist at last visit. Pt states that she was sweating a lot and feels that the medication is making her sweat "why would I want to double that if its making me sweat so much?".  Pt states that she called the office to discuss it, but nobody called her back. Reviewed chart and shared with pt  that there were attempts from psychiatrist and nurse to call her back unsuccessfully. Pt understood. Ensured pt that clinician would reach out to psychiatrist with her concerns. Explored pts relationship with husband and son. Discussed pros and cons of recent trip to get granddaughter settled into college. Pt reports that she got into a discussion with family members regarding granddaughter's roommate that identifies as a lesbian. "I have the right to believe the way I want to believe and don't have to tolerate anyone else's beliefs". Allowed pt safe space to explore her thoughts and feelings about the situation. Discussed health-related concerns and pt is continuing to recover from Covid illness. Pt reports that she had a fever of over 100 as recent as yesterday. Encouraged self care and rest. Continued recommendations are as follows: self care behaviors, positive social engagements, focusing on overall work/home/life balance, and focusing on positive physical and emotional wellness.   Suicidal/Homicidal: No  Therapist Response: Lisa Gutierrez is being intentional about setting positive goals for the future and identifying changes that she would like to make for herself. Lisa Gutierrez is able to identify traumas from the past and process through thoughts and feelings associated with trauma. Lisa Gutierrez is continuing to experience sadness in session while discussing the disappointment related to the loss and pain from the past. This is evidence of growth and taking steps towards progress. Treatment to continue as indicated.      Plan: Return again in 4 weeks.  Diagnosis: Axis I: MDD, recurrent, moderate; anxiety    Axis II: No diagnosis    [place], LCSW 12/22/2020

## 2020-12-29 ENCOUNTER — Encounter: Payer: Self-pay | Admitting: Nurse Practitioner

## 2020-12-29 ENCOUNTER — Ambulatory Visit (INDEPENDENT_AMBULATORY_CARE_PROVIDER_SITE_OTHER): Payer: PPO | Admitting: Nurse Practitioner

## 2020-12-29 VITALS — BP 128/89 | HR 131 | Temp 98.7°F | Ht 64.0 in | Wt 253.0 lb

## 2020-12-29 DIAGNOSIS — Z6841 Body Mass Index (BMI) 40.0 and over, adult: Secondary | ICD-10-CM | POA: Diagnosis not present

## 2020-12-29 DIAGNOSIS — M25562 Pain in left knee: Secondary | ICD-10-CM

## 2020-12-29 DIAGNOSIS — E1165 Type 2 diabetes mellitus with hyperglycemia: Secondary | ICD-10-CM | POA: Diagnosis not present

## 2020-12-29 DIAGNOSIS — H60541 Acute eczematoid otitis externa, right ear: Secondary | ICD-10-CM

## 2020-12-29 DIAGNOSIS — G8929 Other chronic pain: Secondary | ICD-10-CM

## 2020-12-29 DIAGNOSIS — K581 Irritable bowel syndrome with constipation: Secondary | ICD-10-CM | POA: Diagnosis not present

## 2020-12-29 MED ORDER — HYDROCORTISONE-ACETIC ACID 1-2 % OT SOLN: 4.0000 [drp] | Freq: Two times a day (BID) | OTIC | 0 refills | Status: DC

## 2020-12-29 MED ORDER — OZEMPIC (0.25 OR 0.5 MG/DOSE) 2 MG/1.5ML ~~LOC~~ SOPN: 0.2500 mg | PEN_INJECTOR | SUBCUTANEOUS | 3 refills | Status: DC

## 2020-12-29 NOTE — Progress Notes (Signed)
Established Patient Office Visit  Subjective:  Patient ID: Lisa Gutierrez, female    DOB: 20-Oct-1950  Age: 70 y.o. MRN: [id number]  CC:  Chief Complaint  Patient presents with   Diabetes    HPI Lisa Gutierrez presents for follow-up visit.  She states she recently had COVID for the second time.  She states that today is the first day she is able to come out based on quarantine and isolation positive procedure.  She states she had a fever the entire time she was sick.  She states that since having COVID for the second time, her blood sugar, her blood pressure, and heart rate have been elevated.  States that blood pressure and heart rate may be more elevated today because she has not taken her medications yet.  She states that she has had some more severe constipation since having COVID a second time.  She does take Linzess daily.  She has brought some patient assistance paperwork to fill out and return to pharmaceutical company for help. Blood sugars are elevated.  Her hemoglobin A1c is 7.8 today up from 7.1 at most recent check.  She is currently taking Janumet 50/1000 mg tablets twice daily.  States that she has noticed her blood sugars being more elevated.  She has chronic yeast infection, likely due to elevated blood sugar. She is complaining today of left knee pain.  States she does have significant degenerative arthritis in the left knee.  In the past knee replacement has been suggested.  She did not do PT at [facility] clinic.  States this really helped.  Had to stop because of COVID.  She would like to have additional PT prior to seeing orthopedics again or considering knee replacement surgery. She states she has some itchiness and irritation of the left ear.  Hurts a little bit deep down inside.  She denies headache, fever, sore throat, or congestion.  She states ear pain has been present since having COVID for the second time.  She denies nausea, vomiting, diarrhea.  Past  Medical History:  Diagnosis Date   Anxiety    Depression    Depression    Phreesia 07/02/2020   Diabetes mellitus without complication (HCC)    GERD (gastroesophageal reflux disease)    Hypertension    IBS (irritable bowel syndrome)    Neuromuscular disorder [facility])     Past Surgical History:  Procedure Laterality Date   BREAST EXCISIONAL BIOPSY     CESAREAN SECTION N/A    Phreesia 07/02/2020   CHOLECYSTECTOMY     EXCISION / BIOPSY BREAST / NIPPLE / DUCT Right 1973   duct removed   SPINE SURGERY N/A    Phreesia 07/02/2020   TUBAL LIGATION N/A    Phreesia 07/02/2020    Family History  Problem Relation Age of Onset   Diabetes Mother    Hypertension Mother    Coronary artery disease Father    Glaucoma Father    Breast cancer Neg Hx     Social History   Socioeconomic History   Marital status: Married    Spouse name: Not on file   Number of children: Not on file   Years of education: Not on file   Highest education level: Not on file  Occupational History   Not on file  Tobacco Use   Smoking status: Former   Smokeless tobacco: Never  Substance and Sexual Activity   Alcohol use: No    Alcohol/week: 0.0 standard drinks  Drug use: Never   Sexual activity: Not Currently    Partners: Male  Other Topics Concern   Not on file  Social History Narrative   Not on file   Social Determinants of Health   Financial Resource Strain: Not on file  Food Insecurity: Not on file  Transportation Needs: Not on file  Physical Activity: Not on file  Stress: Not on file  Social Connections: Not on file  Intimate Partner Violence: Not on file    Outpatient Medications Prior to Visit  Medication Sig Dispense Refill   acetaminophen (TYLENOL) 500 MG tablet Take 2 tablets by mouth as needed.     ALPRAZolam (XANAX) 0.5 MG tablet Take 1 tablet (0.5 mg total) by mouth daily as needed for anxiety. 30 tablet 1   Flaxseed, Linseed, (FLAXSEED OIL PO) Take 1,500 mg by mouth 2 (two)  times daily.     glucose blood (ACCU-CHEK SMARTVIEW) test strip 1 each by Other route as needed for other. Use as instructed 200 each 11   LINZESS 145 MCG CAPS capsule TAKE 1 CAPSULE BY MOUTH EVERY DAY 90 capsule 0   meclizine (ANTIVERT) 25 MG tablet Take 1 tablet (25 mg total) by mouth 2 (two) times daily as needed for dizziness. 60 tablet 1   metoprolol succinate (TOPROL-XL) 25 MG 24 hr tablet Take 2 tablets (50 mg total) by mouth daily. 180 tablet 1   Multiple Vitamins-Minerals (MULTIVITAMIN ADULT PO) Take 1 tablet by mouth daily.     nystatin (MYCOSTATIN/NYSTOP) powder APPLY TOPICALLY TWICE A DAY 60 g 2   pantoprazole (PROTONIX) 40 MG tablet Take 1 tablet (40 mg total) by mouth 2 (two) times daily. 180 tablet 1   sitaGLIPtin-metformin (JANUMET) 50-1000 MG tablet Take 1 tablet by mouth 2 (two) times daily with a meal. 180 tablet 1   telmisartan-hydrochlorothiazide (MICARDIS HCT) 80-25 MG tablet Take 1 tablet by mouth daily. 90 tablet 1   venlafaxine XR (EFFEXOR-XR) 37.5 MG 24 hr capsule Total of 112.5 mg daily. Take along with 75 mg cap 30 capsule 0   venlafaxine XR (EFFEXOR-XR) 75 MG 24 hr capsule Take 75 mg by mouth daily with breakfast.     fluconazole (DIFLUCAN) 150 MG tablet Take 1 tablet by mouth every 3 days. 3 tablet 0   No facility-administered medications prior to visit.    Allergies  Allergen Reactions   Aspirin Other (See Comments)    GI upset   Ibuprofen Other (See Comments)    GERD   Levofloxacin Other (See Comments)    Weight gain   Meloxicam Other (See Comments)    GI upset   Morphine Other (See Comments)   Naprosyn  [Naproxen] Other (See Comments)    GI upset   Nsaids Other (See Comments)    GI upset   Quinolones    Robinul  [Glycopyrrolate] Nausea And Vomiting   Penicillin V Potassium Rash   Sulfa Antibiotics Rash    ROS Review of Systems  Constitutional:  Positive for activity change and fatigue. Negative for appetite change, chills and fever.  HENT:   Positive for ear pain. Negative for congestion, postnasal drip, rhinorrhea, sinus pressure, sinus pain, sneezing and sore throat.   Eyes: Negative.   Respiratory:  Positive for shortness of breath and wheezing. Negative for cough and chest tightness.   Cardiovascular:  Negative for chest pain and palpitations.       Blood pressure and heart rate.  Gastrointestinal:  Positive for constipation. Negative for abdominal pain,  diarrhea, nausea and vomiting.  Endocrine: Negative for cold intolerance, heat intolerance, polydipsia and polyuria.       Elevated blood sugars over the last few months.  Genitourinary:  Positive for vaginal discharge. Negative for dyspareunia, dysuria, flank pain, frequency and urgency.       Vaginal itching and irritation.  Musculoskeletal:  Positive for arthralgias and myalgias. Negative for back pain.       Left knee pain.  More severe with exertion.  Also hurts more with flexion extension left knee.  Skin:  Negative for rash.  Allergic/Immunologic: Positive for environmental allergies.  Neurological:  Negative for dizziness, weakness and headaches.  Hematological:  Negative for adenopathy.  Psychiatric/Behavioral:  Positive for dysphoric mood. The patient is nervous/anxious.      Objective:    Physical Exam Vitals and nursing note reviewed.  Constitutional:      Appearance: Normal appearance. She is well-developed. She is obese.  HENT:     Head: Normocephalic and atraumatic.     Right Ear: Tenderness present. Tympanic membrane is erythematous and bulging.     Left Ear: Tenderness present. Tympanic membrane is erythematous and bulging.     Ears:     Comments: Left ear canal is red and irritated.  There is flaking of the skin on the outer ear canal present also.  Mild edema noted.    Nose: Nose normal.     Mouth/Throat:     Mouth: Mucous membranes are moist.  Eyes:     Extraocular Movements: Extraocular movements intact.     Conjunctiva/sclera: Conjunctivae  normal.     Pupils: Pupils are equal, round, and reactive to light.  Cardiovascular:     Rate and Rhythm: Normal rate and regular rhythm.     Pulses: Normal pulses.     Heart sounds: Normal heart sounds.  Pulmonary:     Effort: Pulmonary effort is normal.     Breath sounds: Normal breath sounds.  Abdominal:     Palpations: Abdomen is soft.     Tenderness: There is no guarding.  Musculoskeletal:        General: Normal range of motion.     Cervical back: Normal range of motion and neck supple.  Lymphadenopathy:     Cervical: No cervical adenopathy.  Skin:    General: Skin is warm and dry.     Capillary Refill: Capillary refill takes less than 2 seconds.  Neurological:     General: No focal deficit present.     Mental Status: She is alert and oriented to person, place, and time.  Psychiatric:        Mood and Affect: Mood normal.        Behavior: Behavior normal.        Thought Content: Thought content normal.        Judgment: Judgment normal.    Today's Vitals   12/29/20 1111 12/29/20 1147  BP: (!) 137/104 128/89  Pulse: (!) 131   Temp: 98.7 F (37.1 C)   SpO2: 97%   Weight: 253 lb (114.8 kg)   Height: '5\' 4"'$  (1.626 m)    Body mass index is 43.43 kg/m.   Wt Readings from Last 3 Encounters:  12/29/20 253 lb (114.8 kg)  12/09/20 235 lb (106.6 kg)  10/03/20 235 lb (106.6 kg)     Health Maintenance Due  Topic Date Due   TETANUS/TDAP  Never done   COVID-19 Vaccine (4 - Booster for Pfizer series) 06/01/2020   INFLUENZA VACCINE  11/14/2020    There are no preventive care reminders to display for this patient.  No results found for: TSH Lab Results  Component Value Date   WBC 9.5 12/07/2016   HGB 14.2 12/07/2016   HCT 41.6 12/07/2016   MCV 87.7 12/07/2016   PLT 224 12/07/2016   Lab Results  Component Value Date   NA 137 12/07/2016   K 3.3 (L) 12/07/2016   CO2 27 12/07/2016   GLUCOSE 114 (H) 12/07/2016   BUN 14 12/07/2016   CREATININE 0.64 12/07/2016    BILITOT 0.5 12/07/2016   ALKPHOS 56 12/07/2016   AST 30 12/07/2016   ALT 30 12/07/2016   PROT 7.7 12/07/2016   ALBUMIN 4.2 12/07/2016   CALCIUM 9.9 12/07/2016   ANIONGAP 8 12/07/2016   No results found for: CHOL No results found for: HDL No results found for: LDLCALC No results found for: TRIG No results found for: CHOLHDL Lab Results  Component Value Date   HGBA1C 7.8 (A) 01/03/2021      Assessment & Plan:  1. Type 2 diabetes mellitus with hyperglycemia, without long-term current use of insulin (HCC) Hemoglobin A1c is 7.8 today, up from 7.1 at most recent visit.  We will add Ozempic 0.25 mg weekly.  Discussed benefits and potential side effects associated with taking Ozempic.  First dose administered during her office visit today.  She tolerated this well.  We will get patient assistance paperwork to complete and return to [employer] to help with cost of medication.  She should continue Janumet 50/1000 mg twice daily.  Recommend she consume a low-fat, low-cholesterol, low sugar diet.  She should gradually incorporate exercise into her daily routine. - Semaglutide,0.25 or 0.'5MG'$ /DOS, (OZEMPIC, 0.25 OR 0.5 MG/DOSE,) 2 MG/1.5ML SOPN; Inject 0.25 mg into the skin once a week.  Dispense: 1.5 mL; Refill: 3 - POCT glycosylated hemoglobin (Hb A1C)  2. Acute eczematoid otitis externa of right ear Will add acetic acid/hydrocortisone eardrops.  She should put 4 drops in both ears twice daily for the next 7 days.  Advised her to notify the office if there is no improvement over next 5 days or so.  She agrees with this plan. - acetic acid-hydrocortisone (VOSOL-HC) OTIC solution; Place 4 drops into the right ear 2 (two) times daily.  Dispense: 10 mL; Refill: 0  3. Chronic pain of left knee Patient established with orthopedic surgeon.  Has seen physical therapy at [facility].  She would like to pursue annual physical therapy again prior to consulting with orthopedic surgery.  A new referral to  physical therapy made today. - Ambulatory referral to Physical Therapy  4. Irritable bowel syndrome with constipation Patient should continue Linzess as prescribed.  Will fill out new patient assistance paperwork to return to pharmaceutical company to help with medication cost.  5. Body mass index (BMI) of 40.1-44.9 in adult [facility]) Recommend a 1500-calorie diet.  She should consume a low-fat, low-cholesterol, low sugar diet.  She should gradually incorporate exercise into her daily routine.  Problem List Items Addressed This Visit       Digestive   Irritable bowel syndrome with both constipation and diarrhea     Endocrine   Type 2 diabetes mellitus with hyperglycemia, without long-term current use of insulin (HCC) - Primary   Relevant Medications   Semaglutide,0.25 or 0.'5MG'$ /DOS, (OZEMPIC, 0.25 OR 0.5 MG/DOSE,) 2 MG/1.5ML SOPN   Other Relevant Orders   POCT glycosylated hemoglobin (Hb A1C) (Completed)     Nervous and Auditory  Acute eczematoid otitis externa of right ear   Relevant Medications   acetic acid-hydrocortisone (VOSOL-HC) OTIC solution     Other   Chronic pain of left knee   Relevant Orders   Ambulatory referral to Physical Therapy   Body mass index (BMI) of 40.1-44.9 in adult [facility])    Meds ordered this encounter  Medications   Semaglutide,0.25 or 0.'5MG'$ /DOS, (OZEMPIC, 0.25 OR 0.5 MG/DOSE,) 2 MG/1.5ML SOPN    Sig: Inject 0.25 mg into the skin once a week.    Dispense:  1.5 mL    Refill:  3    Sample provided in office    Order Specific Question:   Supervising Provider    Answer:   Beatrice Lecher D [[id number]]   acetic acid-hydrocortisone (VOSOL-HC) OTIC solution    Sig: Place 4 drops into the right ear 2 (two) times daily.    Dispense:  10 mL    Refill:  0    Order Specific Question:   Supervising Provider    Answer:   Beatrice Lecher D [[id number]]   This note was dictated using Dragon Voice Recognition Software. Rapid proofreading was performed to expedite  the delivery of the information. Despite proofreading, phonetic errors will occur which are common with this voice recognition software. Please take this into consideration. If there are any concerns, please contact our office.    Follow-up: Return in about 3 months (around 03/30/2021) for medicare wellness, check HgbA1c - FBW needed at time of visit .    Ronnell Freshwater, NP

## 2021-01-02 ENCOUNTER — Other Ambulatory Visit: Payer: Self-pay | Admitting: Nurse Practitioner

## 2021-01-02 ENCOUNTER — Telehealth: Payer: Self-pay | Admitting: Nurse Practitioner

## 2021-01-02 DIAGNOSIS — B379 Candidiasis, unspecified: Secondary | ICD-10-CM

## 2021-01-02 MED ORDER — FLUCONAZOLE 150 MG PO TABS: ORAL_TABLET | ORAL | 1 refills | Status: DC

## 2021-01-02 MED ORDER — NYSTATIN 100000 UNIT/GM EX OINT: 1.0000 "application " | TOPICAL_OINTMENT | Freq: Two times a day (BID) | CUTANEOUS | 1 refills | Status: DC

## 2021-01-02 NOTE — Telephone Encounter (Signed)
She is correct. I did forget. I have now, Sent prescription for diflucan to [facility]. Thanks for the reminder.  -HB

## 2021-01-02 NOTE — Telephone Encounter (Signed)
Called pt she is aware of her Rx that was sent to [facility]

## 2021-01-02 NOTE — Progress Notes (Signed)
Sent prescription for nystatin ointment to [facility].  Apply externally twice daily as needed.

## 2021-01-02 NOTE — Telephone Encounter (Signed)
Patient left msg stating provider "forgot" to send in Diflucan to pharmacy and is requesting this to be done. AS, CMA

## 2021-01-02 NOTE — Progress Notes (Signed)
Sent prescription for [facility]luc[facility] to [facility]

## 2021-01-03 DIAGNOSIS — G8929 Other chronic pain: Secondary | ICD-10-CM | POA: Insufficient documentation

## 2021-01-03 DIAGNOSIS — Z6841 Body Mass Index (BMI) 40.0 and over, adult: Secondary | ICD-10-CM | POA: Insufficient documentation

## 2021-01-03 DIAGNOSIS — H60541 Acute eczematoid otitis externa, right ear: Secondary | ICD-10-CM | POA: Insufficient documentation

## 2021-01-03 LAB — POCT GLYCOSYLATED HEMOGLOBIN (HGB A1C): Hemoglobin A1C: 7.8 % — AB (ref 4.0–5.6)

## 2021-01-03 NOTE — Patient Instructions (Signed)

## 2021-01-04 ENCOUNTER — Telehealth: Payer: Self-pay

## 2021-01-04 ENCOUNTER — Other Ambulatory Visit (HOSPITAL_COMMUNITY): Payer: Self-pay | Admitting: Psychiatry

## 2021-01-04 NOTE — Telephone Encounter (Signed)
per the pharmacy the effexor -xr 37.5mg  was filled on 12-22-20 but the effexor xr 75mg  was last filled on 11-20-20.   The effexor xr 75mg  needs to be filled.

## 2021-01-04 NOTE — Telephone Encounter (Signed)
pt called states she needs a refill of the 75mg  effexor xr sent to the pharmacy she is completely out

## 2021-01-05 NOTE — Telephone Encounter (Signed)
Per Dr. De Nurse orders pt rx was called in for the effexor xr 75mg 

## 2021-01-06 ENCOUNTER — Other Ambulatory Visit: Payer: Self-pay

## 2021-01-06 ENCOUNTER — Ambulatory Visit (HOSPITAL_BASED_OUTPATIENT_CLINIC_OR_DEPARTMENT_OTHER): Payer: PPO | Admitting: Licensed Clinical Social Worker

## 2021-01-06 DIAGNOSIS — F419 Anxiety disorder, unspecified: Secondary | ICD-10-CM

## 2021-01-06 DIAGNOSIS — F331 Major depressive disorder, recurrent, moderate: Secondary | ICD-10-CM | POA: Diagnosis not present

## 2021-01-06 NOTE — Progress Notes (Signed)
Virtual Visit via Video Note  I connected with Lisa Gutierrez on 01/06/21 at 10:00 AM EDT by a video enabled telemedicine application and verified that I am speaking with the correct person using two identifiers.  Location: Patient: home Provider: remote office [place], [place])   I discussed the limitations of evaluation and management by telemedicine and the availability of in person appointments. The patient expressed understanding and agreed to proceed.   I discussed the assessment and treatment plan with the patient. The patient was provided an opportunity to ask questions and all were answered. The patient agreed with the plan and demonstrated an understanding of the instructions.   The patient was advised to call back or seek an in-person evaluation if the symptoms worsen or if the condition fails to improve as anticipated.  I provided 60 minutes of non-face-to-face time during this encounter.   Kamila Broda R Mouhamad Teed, LCSW   THERAPIST PROGRESS NOTE  Session Time: 10-11a  Participation Level: Active  Behavioral Response: Neat and Well GroomedAlertDepressed  Type of Therapy: Individual Therapy  Treatment Goals addressed:  Problem: Decrease depressive symptoms and improve levels of effective functioning Goal: LTG: Reduce frequency, intensity, and duration of depression symptoms as evidenced by: SSB input needed on appropriate metric Outcome: Progressing Goal: STG: @PREFFIRSTNAME @ will participate in at least 80% of scheduled individual psychotherapy sessions Outcome: Progressing   Interventions: Intervention: Educate @PREFFIRSTNAME @ on cognitive distortions and the rationale for treatment of depression Intervention: Work with @PREFFIRSTNAME @ to identify the major components of a recent episode of depression: physical symptoms, major thoughts and images, and major behaviors they experienced Intervention: Review PLEASE Skills (Treat Physical Illness, Balance Eating,  Avoid Mood-Altering Substances, Balance Sleep and Get Exercise) with @PREFFIRSTNAME @ Intervention: Encourage changes to improve social interaction Intervention: Give positive reinforcement and praise  Summary: Lisa Gutierrez is a 70 y.o. female who presents with continuing symptoms related to depression diagnosis. Pt reports that overall mood is low and that she is managing stress and anxiety symptoms well.  Allowed pt safe space to explore and express thoughts and feelings associated with recent external stressors and life events. Explored pts relationship with husband and family members. Discussed childhood trauma and overall psychological impact.   Continued recommendations are as follows: self care behaviors, positive social engagements, focusing on overall work/home/life balance, and focusing on positive physical and emotional wellness.  .   Suicidal/Homicidal: No  Therapist Response: Lisa Gutierrez is progressing towards set goals. Treatment to continue as indicated.  Plan: Return again in 4 weeks.  Diagnosis: Axis I: MDD, recurrent, anxiety    Axis II: No diagnosis    Rachel Bo Bethanny Toelle, LCSW 01/06/2021

## 2021-01-08 NOTE — Plan of Care (Signed)
  Problem: Decrease depressive symptoms and improve levels of effective functioning Goal: LTG: Reduce frequency, intensity, and duration of depression symptoms as evidenced by: SSB input needed on appropriate metric Outcome: Progressing Goal: STG: @PREFFIRSTNAME @ will participate in at least 80% of scheduled individual psychotherapy sessions Outcome: Progressing Intervention: Educate @PREFFIRSTNAME @ on cognitive distortions and the rationale for treatment of depression Intervention: Work with @PREFFIRSTNAME @ to identify the major components of a recent episode of depression: physical symptoms, major thoughts and images, and major behaviors they experienced Intervention: Review PLEASE Skills (Treat Physical Illness, Balance Eating, Avoid Mood-Altering Substances, Balance Sleep and Get Exercise) with @PREFFIRSTNAME @ Intervention: Encourage changes to improve social interaction Intervention: Give positive reinforcement and praise

## 2021-01-16 ENCOUNTER — Ambulatory Visit: Payer: PPO | Admitting: Licensed Clinical Social Worker

## 2021-01-16 ENCOUNTER — Telehealth: Payer: Self-pay | Admitting: Nurse Practitioner

## 2021-01-16 NOTE — Telephone Encounter (Signed)
Patient calling office to see if forms dropped off to [employer] have been completed? Please call patient back to discuss. AS, CMA

## 2021-01-18 ENCOUNTER — Other Ambulatory Visit: Payer: Self-pay

## 2021-01-18 ENCOUNTER — Encounter: Payer: Self-pay | Admitting: Dietician

## 2021-01-18 ENCOUNTER — Encounter: Payer: PPO | Attending: Nurse Practitioner | Admitting: Dietician

## 2021-01-18 VITALS — Ht 66.0 in | Wt 234.9 lb

## 2021-01-18 DIAGNOSIS — E1165 Type 2 diabetes mellitus with hyperglycemia: Secondary | ICD-10-CM | POA: Diagnosis not present

## 2021-01-18 DIAGNOSIS — Z6837 Body mass index (BMI) 37.0-37.9, adult: Secondary | ICD-10-CM

## 2021-01-18 NOTE — Patient Instructions (Signed)
If out and drinking sweet tea, keep other carb foods to less than 2 servings to prevent high blood sugar.  Eat slowly to help control portions and prevent indigestion/ reflux. Take small bites, chew food thoroughly, put fork down between bites.  Look for "healthy salmon recipe" online OK to have healthy choice or lean cuisine frozen meals.

## 2021-01-18 NOTE — Progress Notes (Deleted)
[place] MD/PA/NP OP Progress Note  01/18/2021 11:23 AM Lisa Gutierrez  MRN:  [id number]  Chief Complaint:  HPI: ***  Covid twice  Visit Diagnosis: No diagnosis found.  Past Psychiatric History: Please see initial evaluation for full details. I have reviewed the history. No updates at this time.     Past Medical History:  Past Medical History:  Diagnosis Date   Anxiety    Depression    Depression    Phreesia 07/02/2020   Diabetes mellitus without complication (HCC)    GERD (gastroesophageal reflux disease)    Hypertension    IBS (irritable bowel syndrome)    Neuromuscular disorder [facility])     Past Surgical History:  Procedure Laterality Date   BREAST EXCISIONAL BIOPSY     CESAREAN SECTION N/A    Phreesia 07/02/2020   CHOLECYSTECTOMY     EXCISION / BIOPSY BREAST / NIPPLE / DUCT Right 1973   duct removed   SPINE SURGERY N/A    Phreesia 07/02/2020   TUBAL LIGATION N/A    Phreesia 07/02/2020    Family Psychiatric History: Please see initial evaluation for full details. I have reviewed the history. No updates at this time.     Family History:  Family History  Problem Relation Age of Onset   Diabetes Mother    Hypertension Mother    Coronary artery disease Father    Glaucoma Father    Breast cancer Neg Hx     Social History:  Social History   Socioeconomic History   Marital status: Married    Spouse name: Not on file   Number of children: Not on file   Years of education: Not on file   Highest education level: Not on file  Occupational History   Not on file  Tobacco Use   Smoking status: Former   Smokeless tobacco: Never  Substance and Sexual Activity   Alcohol use: No    Alcohol/week: 0.0 standard drinks   Drug use: Never   Sexual activity: Not Currently    Partners: Male  Other Topics Concern   Not on file  Social History Narrative   Not on file   Social Determinants of Health   Financial Resource Strain: Not on file  Food Insecurity: Not  on file  Transportation Needs: Not on file  Physical Activity: Not on file  Stress: Not on file  Social Connections: Not on file    Allergies:  Allergies  Allergen Reactions   Aspirin Other (See Comments)    GI upset   Ibuprofen Other (See Comments)    GERD   Levofloxacin Other (See Comments)    Weight gain   Meloxicam Other (See Comments)    GI upset   Morphine Other (See Comments)   Naprosyn  [Naproxen] Other (See Comments)    GI upset   Nsaids Other (See Comments)    GI upset   Quinolones    Robinul  [Glycopyrrolate] Nausea And Vomiting   Penicillin V Potassium Rash   Sulfa Antibiotics Rash    Metabolic Disorder Labs: Lab Results  Component Value Date   HGBA1C 7.8 (A) 01/03/2021   No results found for: PROLACTIN No results found for: CHOL, TRIG, HDL, CHOLHDL, VLDL, LDLCALC No results found for: TSH  Therapeutic Level Labs: No results found for: LITHIUM No results found for: VALPROATE No components found for:  CBMZ  Current Medications: Current Outpatient Medications  Medication Sig Dispense Refill   acetaminophen (TYLENOL) 500 MG tablet Take 2 tablets  by mouth as needed.     acetic acid-hydrocortisone (VOSOL-HC) OTIC solution Place 4 drops into the right ear 2 (two) times daily. 10 mL 0   ALPRAZolam (XANAX) 0.5 MG tablet Take 1 tablet (0.5 mg total) by mouth daily as needed for anxiety. 30 tablet 1   Flaxseed, Linseed, (FLAXSEED OIL PO) Take 1,500 mg by mouth 2 (two) times daily.     fluconazole (DIFLUCAN) 150 MG tablet Take 1 tablet by mouth every 3 days. 3 tablet 1   glucose blood (ACCU-CHEK SMARTVIEW) test strip 1 each by Other route as needed for other. Use as instructed 200 each 11   LINZESS 145 MCG CAPS capsule TAKE 1 CAPSULE BY MOUTH EVERY DAY 90 capsule 0   meclizine (ANTIVERT) 25 MG tablet Take 1 tablet (25 mg total) by mouth 2 (two) times daily as needed for dizziness. 60 tablet 1   metoprolol succinate (TOPROL-XL) 25 MG 24 hr tablet Take 2 tablets  (50 mg total) by mouth daily. 180 tablet 1   Multiple Vitamins-Minerals (MULTIVITAMIN ADULT PO) Take 1 tablet by mouth daily.     nystatin (MYCOSTATIN/NYSTOP) powder APPLY TOPICALLY TWICE A DAY 60 g 2   nystatin ointment (MYCOSTATIN) Apply 1 application topically 2 (two) times daily. 30 g 1   pantoprazole (PROTONIX) 40 MG tablet Take 1 tablet (40 mg total) by mouth 2 (two) times daily. 180 tablet 1   Semaglutide,0.25 or 0.5MG /DOS, (OZEMPIC, 0.25 OR 0.5 MG/DOSE,) 2 MG/1.5ML SOPN Inject 0.25 mg into the skin once a week. 1.5 mL 3   sitaGLIPtin-metformin (JANUMET) 50-1000 MG tablet Take 1 tablet by mouth 2 (two) times daily with a meal. 180 tablet 1   telmisartan-hydrochlorothiazide (MICARDIS HCT) 80-25 MG tablet Take 1 tablet by mouth daily. 90 tablet 1   venlafaxine XR (EFFEXOR-XR) 37.5 MG 24 hr capsule Total of 112.5 mg daily. Take along with 75 mg cap 30 capsule 0   venlafaxine XR (EFFEXOR-XR) 75 MG 24 hr capsule Take 75 mg by mouth daily with breakfast.     No current facility-administered medications for this visit.     Musculoskeletal: Strength & Muscle Tone:  N/A Gait & Station:  N/A Patient leans: N/A  Psychiatric Specialty Exam: Review of Systems  There were no vitals taken for this visit.There is no height or weight on file to calculate BMI.  General Appearance: {Appearance:22683}  Eye Contact:  {BHH EYE CONTACT:22684}  Speech:  Clear and Coherent  Volume:  Normal  Mood:  {BHH MOOD:22306}  Affect:  {Affect (PAA):22687}  Thought Process:  Coherent  Orientation:  Full (Time, Place, and Person)  Thought Content: Logical   Suicidal Thoughts:  {ST/HT (PAA):22692}  Homicidal Thoughts:  {ST/HT (PAA):22692}  Memory:  Immediate;   Good  Judgement:  {Judgement (PAA):22694}  Insight:  {Insight (PAA):22695}  Psychomotor Activity:  Normal  Concentration:  Concentration: Good and Attention Span: Good  Recall:  Good  Fund of Knowledge: Good  Language: Good  Akathisia:  No  Handed:   Right  AIMS (if indicated): not done  Assets:  Communication Skills Desire for Improvement  ADL's:  Intact  Cognition: WNL  Sleep:  {BHH GOOD/FAIR/POOR:22877}   Screenings: GAD-7    Flowsheet Row Office Visit from 12/09/2020 in [place] at [facility]  Total GAD-7 Score [place] from 02/12/2020 in [facility], [place] from 02/06/2018 in [facility]  Total Score (  max 30 points ) 29 30      PHQ2-9    [place] Visit from 12/09/2020 in [place] at [facility] from 09/28/2020 in [place] Office Visit from 08/22/2020 in [place] at [place] Visit from 07/05/2020 in [place] at [place] from 06/29/2020 in [place]  PHQ-2 Total Score 5 2 2  0 3  PHQ-9 Total Score 21 -- 17 1 14       Flowsheet Row Counselor from 12/22/2020 in [place] Video Visit from 11/23/2020 in [place] Counselor from 09/28/2020 in [place] No Risk No Risk No Risk        Assessment and Plan:  Charvi Gammage is a 70 y.o. year old female with a history of  depression, hypertension, diabetes, GERD, who presents for follow up appointment for below.   1. Anxiety 2. MDD (major depressive disorder), recurrent episode, moderate ([place]) # r/o PTSD She reports depressive symptoms and anxiety in the context of marital conflict, conflict with her son, and recent laving of her granddaughter for college, who she has good connection with.  Other psychosocial stressors includes knee pain.  Although she reports limited support/social connection, she is willing to work on daily activity to improve her life.  She is willing to try higher dose of venlafaxine.   Noted that although she reports increase in appetite since starting venlafaxine, it is unclear whether it is more attributable to adverse reaction from venlafaxine or ineffective coping skills due to stress.  We uptitrate the dose to target depression and anxiety.  Will continue Xanax as needed for anxiety.    Plan Increase venlafaxine 150 mg daily - monitor weight gain Continue xanax 0.5 mg daily as needed for anxiety (she has been taking this dose) Next appointment- 10/5 at 3 PM for 30 mins, video   The patient demonstrates the following risk factors for suicide: Chronic risk factors for suicide include: psychiatric disorder of depression and chronic pain. Acute risk factors for suicide include: family or marital conflict. Protective factors for this patient include: hope for the future. Considering these factors, the overall suicide risk at this point appears to be low. Patient is appropriate for outpatient follow up.   [name], MD 01/18/2021, 11:23 AM

## 2021-01-18 NOTE — Progress Notes (Signed)
Medical Nutrition Therapy: Visit start time: 1100  end time: 1200  Assessment:  Diagnosis: Type 2 diabetes Past medical history: HTN, obesity, GERD, NAFLD Psychosocial issues/ stress concerns: depression, anxiety  Preferred learning method:  Hands-on   Current weight: 234.9lbs Height: 5'6" BMI: 37.91 Medications, supplements: reconciled list in medical record  Progress and evaluation:  Patient reports ongoing stress with family members; she feels isolated at home, does nto have friends to talk with and provide support on a regular basis. She is seeing therapist every 4-6 weeks. She reports having had COVID 3 separate times in the past 2 years, which has depleted her energy and motivation.  Foods have been tasting differently since having COVID Has started on Ozempic which has decreased appetite. Recent HbA1C (01/03/21) was 7.8%.  Physical activity: no structured activity at this time; ongoing recovery from COVID and pool inaccessible at this time.  Dietary Intake:  Usual eating pattern includes 2-3 meals and 1-2 snacks per day. Dining out frequency: 2-3 meals per week.  Breakfast: 1/2 blueberry bagel with butter; occ biscuit  with [place] sausage and egg (did not feel well afterwards) Snack: none Lunch: sometimes sandwich; pkg peanut butter carckers; leftovers; salad -- veg only (does not like chicken with salad); sometimes skips -- not hungry Snack: used to eat popcorn; chips -- no longer taste good; wheat thins Supper: chicken strips, 4 bites grits; peas (vomited after); avoids rice due to difficulty controlling portion Snack: hungriest time -- sugar free jello or pudding cup; wheat thins; nabs -- feels it raises blood sugar the next morning Beverages: water, 1/2-sweet tea 1-2 glasses daily  Nutrition Care Education: Topics covered:  Basic nutrition: basic food groups, appropriate nutrient balance, appropriate meal and snack schedule, general nutrition guidelines    Weight  control: importance of low sugar and low fat choices; portion control strategies; provided guidance for appropriate protein (6-8oz daily, carb (9-10 servings daily and 2-3 with each meal), and fat intake (1-2 tsp added fats per meal) Diabetes:  appropriate meal and snack schedule, appropriate carb intake and balance, healthy carb choices, role of fiber, protein, fat Other:  avoiding large meals and high fat foods to prevent GERD/ nausea, vomiting after eating; discussed making changes in small steps, 1-2 changes at a time to boost confidence and motivation and making positive lifestyle changes.  Nutritional Diagnosis:  [place]-2.2 Altered nutrition-related laboratory As related to diabetes type 2.  As evidenced by recent HbA1C of 7.8%. [place]-3.3 Overweight/obesity As related to excess calories, inadequate physical activity, stress/ anxiety/ depression.  As evidenced by current BMI of 37.9.  Intervention:  Instruction and discussion as noted above. Patient reports readiness for some changes; encouraged small incremental changes to avoid added stress or being overwhelmed. Established goals with direction from patient.  Education Materials given:  Museum/gallery conservator with food lists, sample meal pattern Sample menus Visit summary with goals/ instructions   Learner/ who was taught:  Patient    Level of understanding: Verbalizes/ demonstrates competency   Demonstrated degree of understanding via:   Teach back Learning barriers: None   Willingness to learn/ readiness for change: Acceptance, ready for change   Monitoring and Evaluation:  Dietary intake, exercise, BG control, and body weight      follow up: 03/08/21 at 2:30pm

## 2021-01-19 ENCOUNTER — Telehealth: Payer: PPO | Admitting: Psychiatry

## 2021-01-20 ENCOUNTER — Telehealth: Payer: Self-pay | Admitting: Dietician

## 2021-01-20 NOTE — Telephone Encounter (Signed)
Returned message from patient with nutrition questions. She asked about option for meal replacement drink in case of poor appetite at a mealtime; discussed low sugar options such as Premier, [employer], [employer] Breakfast low sugar version. She asked about drink for liver cleansing; advised healthy diet low in processed starches and sugars and unhealthy fats.

## 2021-01-21 NOTE — Progress Notes (Deleted)
[place] MD/PA/NP OP Progress Note  01/21/2021 8:24 AM Lisa Gutierrez  MRN:  [id number]  Chief Complaint:  HPI: *** Visit Diagnosis: No diagnosis found.  Past Psychiatric History: Please see initial evaluation for full details. I have reviewed the history. No updates at this time.     Past Medical History:  Past Medical History:  Diagnosis Date   Anxiety    Depression    Depression    Phreesia 07/02/2020   Diabetes mellitus without complication (HCC)    GERD (gastroesophageal reflux disease)    Hypertension    IBS (irritable bowel syndrome)    Neuromuscular disorder [facility])     Past Surgical History:  Procedure Laterality Date   BREAST EXCISIONAL BIOPSY     CESAREAN SECTION N/A    Phreesia 07/02/2020   CHOLECYSTECTOMY     EXCISION / BIOPSY BREAST / NIPPLE / DUCT Right 1973   duct removed   SPINE SURGERY N/A    Phreesia 07/02/2020   TUBAL LIGATION N/A    Phreesia 07/02/2020    Family Psychiatric History: Please see initial evaluation for full details. I have reviewed the history. No updates at this time.     Family History:  Family History  Problem Relation Age of Onset   Diabetes Mother    Hypertension Mother    Coronary artery disease Father    Glaucoma Father    Breast cancer Neg Hx     Social History:  Social History   Socioeconomic History   Marital status: Married    Spouse name: Not on file   Number of children: Not on file   Years of education: Not on file   Highest education level: Not on file  Occupational History   Not on file  Tobacco Use   Smoking status: Former   Smokeless tobacco: Never  Substance and Sexual Activity   Alcohol use: No    Alcohol/week: 0.0 standard drinks   Drug use: Never   Sexual activity: Not Currently    Partners: Male  Other Topics Concern   Not on file  Social History Narrative   Not on file   Social Determinants of Health   Financial Resource Strain: Not on file  Food Insecurity: Not on file   Transportation Needs: Not on file  Physical Activity: Not on file  Stress: Not on file  Social Connections: Not on file    Allergies:  Allergies  Allergen Reactions   Aspirin Other (See Comments)    GI upset   Ibuprofen Other (See Comments)    GERD   Levofloxacin Other (See Comments)    Weight gain   Meloxicam Other (See Comments)    GI upset   Morphine Other (See Comments)   Naprosyn  [Naproxen] Other (See Comments)    GI upset   Nsaids Other (See Comments)    GI upset   Quinolones    Robinul  [Glycopyrrolate] Nausea And Vomiting   Penicillin V Potassium Rash   Sulfa Antibiotics Rash    Metabolic Disorder Labs: Lab Results  Component Value Date   HGBA1C 7.8 (A) 01/03/2021   No results found for: PROLACTIN No results found for: CHOL, TRIG, HDL, CHOLHDL, VLDL, LDLCALC No results found for: TSH  Therapeutic Level Labs: No results found for: LITHIUM No results found for: VALPROATE No components found for:  CBMZ  Current Medications: Current Outpatient Medications  Medication Sig Dispense Refill   acetaminophen (TYLENOL) 500 MG tablet Take 2 tablets by mouth as needed.  acetic acid-hydrocortisone (VOSOL-HC) OTIC solution Place 4 drops into the right ear 2 (two) times daily. 10 mL 0   ALPRAZolam (XANAX) 0.5 MG tablet Take 1 tablet (0.5 mg total) by mouth daily as needed for anxiety. 30 tablet 1   Bioflavonoid Products (VITAMIN C) CHEW Chew by mouth. With vitamin D and elderberry     Calcium Carbonate (CALCIUM 500 PO) Take by mouth.     Flaxseed, Linseed, (FLAXSEED OIL PO) Take 1,500 mg by mouth 2 (two) times daily.     fluconazole (DIFLUCAN) 150 MG tablet Take 1 tablet by mouth every 3 days. 3 tablet 1   glucose blood (ACCU-CHEK SMARTVIEW) test strip 1 each by Other route as needed for other. Use as instructed 200 each 11   LINZESS 145 MCG CAPS capsule TAKE 1 CAPSULE BY MOUTH EVERY DAY 90 capsule 0   meclizine (ANTIVERT) 25 MG tablet Take 1 tablet (25 mg total)  by mouth 2 (two) times daily as needed for dizziness. 60 tablet 1   metoprolol succinate (TOPROL-XL) 25 MG 24 hr tablet Take 2 tablets (50 mg total) by mouth daily. 180 tablet 1   Multiple Vitamins-Minerals (MULTIVITAMIN ADULT PO) Take 1 tablet by mouth daily.     nystatin (MYCOSTATIN/NYSTOP) powder APPLY TOPICALLY TWICE A DAY 60 g 2   nystatin ointment (MYCOSTATIN) Apply 1 application topically 2 (two) times daily. 30 g 1   pantoprazole (PROTONIX) 40 MG tablet Take 1 tablet (40 mg total) by mouth 2 (two) times daily. 180 tablet 1   Semaglutide,0.25 or 0.5MG /DOS, (OZEMPIC, 0.25 OR 0.5 MG/DOSE,) 2 MG/1.5ML SOPN Inject 0.25 mg into the skin once a week. 1.5 mL 3   sitaGLIPtin-metformin (JANUMET) 50-1000 MG tablet Take 1 tablet by mouth 2 (two) times daily with a meal. 180 tablet 1   telmisartan-hydrochlorothiazide (MICARDIS HCT) 80-25 MG tablet Take 1 tablet by mouth daily. 90 tablet 1   venlafaxine XR (EFFEXOR-XR) 37.5 MG 24 hr capsule Total of 112.5 mg daily. Take along with 75 mg cap 30 capsule 0   venlafaxine XR (EFFEXOR-XR) 75 MG 24 hr capsule Take 75 mg by mouth daily with breakfast.     No current facility-administered medications for this visit.     Musculoskeletal: Strength & Muscle Tone:  N/A Gait & Station:  N/A Patient leans: N/A  Psychiatric Specialty Exam: Review of Systems  There were no vitals taken for this visit.There is no height or weight on file to calculate BMI.  General Appearance: {Appearance:22683}  Eye Contact:  {BHH EYE CONTACT:22684}  Speech:  Clear and Coherent  Volume:  Normal  Mood:  {BHH MOOD:22306}  Affect:  {Affect (PAA):22687}  Thought Process:  Coherent  Orientation:  Full (Time, Place, and Person)  Thought Content: Logical   Suicidal Thoughts:  {ST/HT (PAA):22692}  Homicidal Thoughts:  {ST/HT (PAA):22692}  Memory:  Immediate;   Good  Judgement:  {Judgement (PAA):22694}  Insight:  {Insight (PAA):22695}  Psychomotor Activity:  Normal   Concentration:  Concentration: Good and Attention Span: Good  Recall:  Good  Fund of Knowledge: Good  Language: Good  Akathisia:  No  Handed:  Right  AIMS (if indicated): not done  Assets:  Communication Skills Desire for Improvement  ADL's:  Intact  Cognition: WNL  Sleep:  {BHH GOOD/FAIR/POOR:22877}   Screenings: GAD-7    Flowsheet Row Office Visit from 12/09/2020 in [place] at [facility]  Total GAD-7 Score [place]  Clinical Support from 02/12/2020 in [facility], [place] from 02/06/2018 in [facility]  Total Score (max 30 points ) 29 30      PHQ2-9    Flowsheet Row Nutrition from 01/18/2021 in [place] Office Visit from 12/09/2020 in [place] at [place] from 09/28/2020 in [place] Visit from 08/22/2020 in [place] at [place] Visit from 07/05/2020 in [place] at [facility] Total Score 5 5 2 2  0  PHQ-9 Total Score 21 21 -- 17 1      Flowsheet Row Counselor from 12/22/2020 in [place] Video Visit from 11/23/2020 in [place] Counselor from 09/28/2020 in [place] No Risk No Risk No Risk        Assessment and Plan:  Lisa Gutierrez is a 70 y.o. year old female with a history of depression, hypertension, diabetes, GERD, who presents for follow up appointment for below.    1. Anxiety 2. MDD (major depressive disorder), recurrent episode, moderate ([place]) # r/o PTSD She reports depressive symptoms and anxiety in the context of marital conflict, conflict with her son, and recent laving of her granddaughter for college, who she has good connection with.  Other psychosocial stressors includes knee pain.  Although she reports limited  support/social connection, she is willing to work on daily activity to improve her life.  She is willing to try higher dose of venlafaxine.  Noted that although she reports increase in appetite since starting venlafaxine, it is unclear whether it is more attributable to adverse reaction from venlafaxine or ineffective coping skills due to stress.  We uptitrate the dose to target depression and anxiety.  Will continue Xanax as needed for anxiety.    Plan Increase venlafaxine 150 mg daily - monitor weight gain Continue xanax 0.5 mg daily as needed for anxiety (she has been taking this dose) Next appointment- 10/5 at 3 PM for 30 mins, video   The patient demonstrates the following risk factors for suicide: Chronic risk factors for suicide include: psychiatric disorder of depression and chronic pain. Acute risk factors for suicide include: family or marital conflict. Protective factors for this patient include: hope for the future. Considering these factors, the overall suicide risk at this point appears to be low. Patient is appropriate for outpatient follow up.     [name], MD 01/21/2021, 8:24 AM

## 2021-01-23 ENCOUNTER — Ambulatory Visit: Payer: PPO | Admitting: Licensed Clinical Social Worker

## 2021-01-23 NOTE — Telephone Encounter (Signed)
Please let Lisa Gutierrez know that I just got the paperwork needed for ozempic from the rep today and I will be working on that so I can get to her. After both parts are filled out, I can submit it to drug company. I am still working on [employer] paperwork but should have that done in next day or two. Thanks.

## 2021-01-24 NOTE — Telephone Encounter (Signed)
She can pick up another sample of the ozempic when she picks up patient portion of paperwork. I can fax paperwork as soon as all of it is complete. Thanks.

## 2021-01-24 NOTE — Telephone Encounter (Signed)
Called pt she is aware of her paperwork that needs to be filled out

## 2021-01-24 NOTE — Telephone Encounter (Signed)
Called pt she is aware of the paper work that is is being process

## 2021-01-26 ENCOUNTER — Telehealth: Payer: PPO | Admitting: Psychiatry

## 2021-01-30 ENCOUNTER — Other Ambulatory Visit: Payer: Self-pay | Admitting: Psychiatry

## 2021-01-30 ENCOUNTER — Telehealth: Payer: Self-pay

## 2021-01-30 ENCOUNTER — Other Ambulatory Visit: Payer: Self-pay | Admitting: Nurse Practitioner

## 2021-01-30 DIAGNOSIS — K581 Irritable bowel syndrome with constipation: Secondary | ICD-10-CM

## 2021-01-30 DIAGNOSIS — E1165 Type 2 diabetes mellitus with hyperglycemia: Secondary | ICD-10-CM

## 2021-01-30 MED ORDER — OZEMPIC (0.25 OR 0.5 MG/DOSE) 2 MG/1.5ML ~~LOC~~ SOPN: PEN_INJECTOR | SUBCUTANEOUS | 3 refills | Status: DC

## 2021-01-30 MED ORDER — LINZESS 145 MCG PO CAPS: 145.0000 ug | ORAL_CAPSULE | Freq: Every day | ORAL | 3 refills | Status: DC

## 2021-01-30 MED ORDER — VENLAFAXINE HCL ER 37.5 MG PO CP24: ORAL_CAPSULE | ORAL | 0 refills | Status: DC

## 2021-01-30 NOTE — Telephone Encounter (Signed)
Ordered

## 2021-01-30 NOTE — Telephone Encounter (Signed)
pt called left message that she needs refills on her effexor 37.5mg .   I called pt back because previous message dr. Modesta Messing states pt was on 150mg .  Per the patient she is taking a 75mg  and a 37.5mg 

## 2021-01-30 NOTE — Telephone Encounter (Signed)
rereceived fax requesting a refil on the venlafaxine HCL ER 37.5mg 

## 2021-01-30 NOTE — Telephone Encounter (Signed)
Could you verity with her if she is taking venlafaxine 150 mg as discussed at the last visit? The dose may have been changed by another provider while I was on leave, so I wanted to check in.

## 2021-02-01 ENCOUNTER — Other Ambulatory Visit (HOSPITAL_COMMUNITY): Payer: Self-pay | Admitting: Psychiatry

## 2021-02-02 DIAGNOSIS — E119 Type 2 diabetes mellitus without complications: Secondary | ICD-10-CM | POA: Diagnosis not present

## 2021-02-02 LAB — HM DIABETES EYE EXAM

## 2021-02-10 ENCOUNTER — Ambulatory Visit (INDEPENDENT_AMBULATORY_CARE_PROVIDER_SITE_OTHER): Payer: PPO | Admitting: Licensed Clinical Social Worker

## 2021-02-10 ENCOUNTER — Other Ambulatory Visit: Payer: Self-pay

## 2021-02-10 DIAGNOSIS — F419 Anxiety disorder, unspecified: Secondary | ICD-10-CM | POA: Diagnosis not present

## 2021-02-10 DIAGNOSIS — F331 Major depressive disorder, recurrent, moderate: Secondary | ICD-10-CM

## 2021-02-10 NOTE — Plan of Care (Signed)
  Problem: Decrease depressive symptoms and improve levels of effective functioning Goal: LTG: Reduce frequency, intensity, and duration of depression symptoms as evidenced by: SSB input needed on appropriate metric Outcome: Not Progressing Note: "They are about the same--I don't think my medicine is working" Goal: STG: @PREFFIRSTNAME @ will participate in at least 80% of scheduled individual psychotherapy sessions Outcome: Progressing Note: Pt on time and engaged throughout session Intervention: Give positive reinforcement and praise Intervention: Give positive reinforcement and praise Intervention: WORK WITH Lisa Gutierrez TO IDENTIFY THE MAJOR COMPONENTS OF A RECENT EPISODE OF DEPRESSION: PHYSICAL SYMPTOMS, MAJOR THOUGHTS AND IMAGES, AND MAJOR BEHAVIORS THEY EXPERIENCED Intervention: Encourage changes to improve social interaction

## 2021-02-10 NOTE — Progress Notes (Signed)
Virtual Visit via Video Note  I connected with Lisa Gutierrez on 02/10/21 at 11:00 AM EDT by a video enabled telemedicine application and verified that I am speaking with the correct person using two identifiers.  Location: Patient: home Provider: remote office [place], [place])   I discussed the limitations of evaluation and management by telemedicine and the availability of in person appointments. The patient expressed understanding and agreed to proceed.  I discussed the assessment and treatment plan with the patient. The patient was provided an opportunity to ask questions and all were answered. The patient agreed with the plan and demonstrated an understanding of the instructions.   The patient was advised to call back or seek an in-person evaluation if the symptoms worsen or if the condition fails to improve as anticipated.  I provided 60 minutes of non-face-to-face time during this encounter.   [place], LCSW   THERAPIST PROGRESS NOTE  Session Time: 11a-12p  Participation Level: Active  Behavioral Response: Neat and Well GroomedAlertAnxious, Depressed, and Irritable  Type of Therapy: Individual Therapy  Treatment Goals addressed:  Goal: LTG: Reduce frequency, intensity, and duration of depression symptoms as evidenced by: SSB input needed on appropriate metric Outcome: Not Progressing Note: "They are about the same--I don't think my medicine is working" Goal: STG: @PREFFIRSTNAME @ will participate in at least 80% of scheduled individual psychotherapy sessions Outcome: Progressing Note: Pt on time and engaged throughout session  Interventions:  Intervention: Give positive reinforcement and praise Intervention: Give positive reinforcement and praise Intervention: WORK WITH [place] TO IDENTIFY THE MAJOR COMPONENTS OF A RECENT EPISODE OF DEPRESSION: PHYSICAL SYMPTOMS, MAJOR THOUGHTS AND IMAGES, AND MAJOR BEHAVIORS THEY EXPERIENCED Intervention: Encourage  changes to improve social interaction    Summary: Lisa Gutierrez is a 70 y.o. female who presents with symptoms consistent with depression. Pt reports that her overall mood is stable but that she is still feeling more down and depressed. Pt feels that she is not able to engage in activities that she wants to engage in because her husband doesn't want to. "He helps all the seniors every day and he just doesn't have any energy when he comes home to me". Pt states that she has tried communicating with him about her needs and getting out of the house, but he is not interested. Discussed negative versus positive perspectives with family, feelings, situations, and thoughts. Explored past trauma and overall psychological impact. Pt is able to piece together negative self feelings in present and how its linked to traumas from the past. Helped pt identify strengths in self.   Continued recommendations are as follows: self care behaviors, positive social engagements, focusing on overall work/home/life balance, and focusing on positive physical and emotional wellness.   Suicidal/Homicidal: No  Therapist Response: Pt is continuing to apply interventions learned in session into daily life situations. Pt is currently on track to meet goals utilizing interventions mentioned above. Personal growth and progress noted. Treatment to continue as indicated.   Pt is continuing to have some depression and anxiety symptoms and is utilizing coping skills when she is experiencing things. Pt is aware that there may environmental barriers impacting activities that could be beneficial to her. Due to this, pt spends a lot of time isolated and at home.   Plan: Return again in 4 weeks.  Diagnosis: Axis I: MDD, recurrent    Axis II: No diagnosis    [place], LCSW 02/10/2021

## 2021-02-11 ENCOUNTER — Other Ambulatory Visit: Payer: Self-pay | Admitting: Nurse Practitioner

## 2021-02-11 DIAGNOSIS — K581 Irritable bowel syndrome with constipation: Secondary | ICD-10-CM

## 2021-02-13 NOTE — Progress Notes (Deleted)
Lisa Gutierrez  02/13/2021 11:32 AM Lisa Gutierrez  MRN:  [id number]  Chief Complaint:  HPI: *** Visit Diagnosis: No diagnosis found.  Past Psychiatric History: Please see initial evaluation for full details. I have reviewed the history. No updates at this time.     Past Medical History:  Past Medical History:  Diagnosis Date   Anxiety    Depression    Depression    Phreesia 07/02/2020   Diabetes mellitus without complication (HCC)    GERD (gastroesophageal reflux disease)    Hypertension    IBS (irritable bowel syndrome)    Neuromuscular disorder [facility])     Past Surgical History:  Procedure Laterality Date   BREAST EXCISIONAL BIOPSY     CESAREAN SECTION N/A    Phreesia 07/02/2020   CHOLECYSTECTOMY     EXCISION / BIOPSY BREAST / NIPPLE / DUCT Right 1973   duct removed   SPINE SURGERY N/A    Phreesia 07/02/2020   TUBAL LIGATION N/A    Phreesia 07/02/2020    Family Psychiatric History: Please see initial evaluation for full details. I have reviewed the history. No updates at this time.     Family History:  Family History  Problem Relation Age of Onset   Diabetes Mother    Hypertension Mother    Coronary artery disease Father    Glaucoma Father    Breast cancer Neg Hx     Social History:  Social History   Socioeconomic History   Marital status: Married    Spouse name: Not on file   Number of children: Not on file   Years of education: Not on file   Highest education level: Not on file  Occupational History   Not on file  Tobacco Use   Smoking status: Former   Smokeless tobacco: Never  Substance and Sexual Activity   Alcohol use: No    Alcohol/week: 0.0 standard drinks   Drug use: Never   Sexual activity: Not Currently    Partners: Male  Other Topics Concern   Not on file  Social History Narrative   Not on file   Social Determinants of Health   Financial Resource Strain: Not on file  Food Insecurity: Not on file   Transportation Needs: Not on file  Physical Activity: Not on file  Stress: Not on file  Social Connections: Not on file    Allergies:  Allergies  Allergen Reactions   Aspirin Other (See Comments)    GI upset   Ibuprofen Other (See Comments)    GERD   Levofloxacin Other (See Comments)    Weight gain   Meloxicam Other (See Comments)    GI upset   Morphine Other (See Comments)   Naprosyn  [Naproxen] Other (See Comments)    GI upset   Nsaids Other (See Comments)    GI upset   Quinolones    Robinul  [Glycopyrrolate] Nausea And Vomiting   Penicillin V Potassium Rash   Sulfa Antibiotics Rash    Metabolic Disorder Labs: Lab Results  Component Value Date   HGBA1C 7.8 (A) 01/03/2021   No results found for: PROLACTIN No results found for: CHOL, TRIG, HDL, CHOLHDL, VLDL, LDLCALC No results found for: TSH  Therapeutic Level Labs: No results found for: LITHIUM No results found for: VALPROATE No components found for:  CBMZ  Current Medications: Current Outpatient Medications  Medication Sig Dispense Refill   acetaminophen (TYLENOL) 500 MG tablet Take 2 tablets by mouth as needed.  acetic acid-hydrocortisone (VOSOL-HC) OTIC solution Place 4 drops into the right ear 2 (two) times daily. 10 mL 0   Bioflavonoid Products (VITAMIN C) CHEW Chew by mouth. With vitamin D and elderberry     Calcium Carbonate (CALCIUM 500 PO) Take by mouth.     Flaxseed, Linseed, (FLAXSEED OIL PO) Take 1,500 mg by mouth 2 (two) times daily.     fluconazole (DIFLUCAN) 150 MG tablet Take 1 tablet by mouth every 3 days. 3 tablet 1   glucose blood (ACCU-CHEK SMARTVIEW) test strip 1 each by Other route as needed for other. Use as instructed 200 each 11   LINZESS 145 MCG CAPS capsule TAKE 1 CAPSULE BY MOUTH EVERY DAY 30 capsule 2   meclizine (ANTIVERT) 25 MG tablet Take 1 tablet (25 mg total) by mouth 2 (two) times daily as needed for dizziness. 60 tablet 1   metoprolol succinate (TOPROL-XL) 25 MG 24 hr  tablet Take 2 tablets (50 mg total) by mouth daily. 180 tablet 1   Multiple Vitamins-Minerals (MULTIVITAMIN ADULT PO) Take 1 tablet by mouth daily.     nystatin (MYCOSTATIN/NYSTOP) powder APPLY TOPICALLY TWICE A DAY 60 g 2   nystatin ointment (MYCOSTATIN) Apply 1 application topically 2 (two) times daily. 30 g 1   pantoprazole (PROTONIX) 40 MG tablet Take 1 tablet (40 mg total) by mouth 2 (two) times daily. 180 tablet 1   Semaglutide,0.25 or 0.5MG /DOS, (OZEMPIC, 0.25 OR 0.5 MG/DOSE,) 2 MG/1.5ML SOPN Inject up to 0.5mg  [place] weekly 1.5 mL 3   sitaGLIPtin-metformin (JANUMET) 50-1000 MG tablet Take 1 tablet by mouth 2 (two) times daily with a meal. 180 tablet 1   telmisartan-hydrochlorothiazide (MICARDIS HCT) 80-25 MG tablet Take 1 tablet by mouth daily. 90 tablet 1   venlafaxine XR (EFFEXOR-XR) 37.5 MG 24 hr capsule Total of 112.5 mg daily. Take along with 75 mg cap 30 capsule 0   venlafaxine XR (EFFEXOR-XR) 75 MG 24 hr capsule Take 1 capsule (75 mg total) by mouth daily. Total of 112.5 mg daily. Take along with 37.5 mg cap 30 capsule 0   No current facility-administered medications for this visit.     Musculoskeletal: Strength & Muscle Tone:  N/A Gait & Station:  N/A Patient leans: N/A  Psychiatric Specialty Exam: Review of Systems  There were no vitals taken for this visit.There is no height or weight on file to calculate BMI.  General Appearance: {Appearance:22683}  Eye Contact:  {BHH EYE CONTACT:22684}  Speech:  Clear and Coherent  Volume:  Normal  Mood:  {BHH MOOD:22306}  Affect:  {Affect (PAA):22687}  Thought Process:  Coherent  Orientation:  Full (Time, Place, and Person)  Thought Content: Logical   Suicidal Thoughts:  {ST/HT (PAA):22692}  Homicidal Thoughts:  {ST/HT (PAA):22692}  Memory:  Immediate;   Good  Judgement:  {Judgement (PAA):22694}  Insight:  {Insight (PAA):22695}  Psychomotor Activity:  Normal  Concentration:  Concentration: Good and Attention Span: Good  Recall:   Good  Fund of Knowledge: Good  Language: Good  Akathisia:  No  Handed:  Right  AIMS (if indicated): not done  Assets:  Communication Skills Desire for Improvement  ADL's:  Intact  Cognition: WNL  Sleep:  {BHH GOOD/FAIR/POOR:22877}   Screenings: GAD-7    Flowsheet Row Office Visit from 12/09/2020 in [place] at [facility]  Total GAD-7 Score [place] from 02/12/2020 in [facility], Time from 02/06/2018  in [facility], [facility]  Total Score (max 30 points ) 29 30      PHQ2-9    Flowsheet Row Nutrition from 01/18/2021 in [place] Office Visit from 12/09/2020 in [place] at [place] from 09/28/2020 in [place] Visit from 08/22/2020 in [place] at [place] Visit from 07/05/2020 in [place] at [facility] Total Score 5 5 2 2  0  PHQ-9 Total Score 21 21 -- 17 1      Flowsheet Row Counselor from 02/10/2021 in [place] from 12/22/2020 in [place] Video Visit from 11/23/2020 in [place] No Risk No Risk No Risk        Assessment and Plan:  Lisa Gutierrez is a 70 y.o. year old female with a history of  depression, hypertension, diabetes, GERD, who presents for follow up appointment for below.     1. Anxiety 2. MDD (major depressive disorder), recurrent episode, moderate ([place]) # r/o PTSD She reports depressive symptoms and anxiety in the context of marital conflict, conflict with her son, and recent laving of her granddaughter for college, who she has good connection with.  Other psychosocial stressors includes knee pain.  Although she reports limited support/social connection, she is willing to work on daily activity to  improve her life.  She is willing to try higher dose of venlafaxine.  Noted that although she reports increase in appetite since starting venlafaxine, it is unclear whether it is more attributable to adverse reaction from venlafaxine or ineffective coping skills due to stress.  We uptitrate the dose to target depression and anxiety.  Will continue Xanax as needed for anxiety.    Plan Increase venlafaxine 150 mg daily - monitor weight gain Continue xanax 0.5 mg daily as needed for anxiety (she has been taking this dose) Next appointment- 10/5 at 3 PM for 30 mins, video   The patient demonstrates the following risk factors for suicide: Chronic risk factors for suicide include: psychiatric disorder of depression and chronic pain. Acute risk factors for suicide include: family or marital conflict. Protective factors for this patient include: hope for the future. Considering these factors, the overall suicide risk at this point appears to be low. Patient is appropriate for outpatient follow up.    [name], MD 02/13/2021, 11:32 AM

## 2021-02-15 ENCOUNTER — Telehealth: Payer: PPO | Admitting: Psychiatry

## 2021-02-23 ENCOUNTER — Ambulatory Visit (INDEPENDENT_AMBULATORY_CARE_PROVIDER_SITE_OTHER): Payer: PPO | Admitting: Nurse Practitioner

## 2021-02-23 ENCOUNTER — Encounter: Payer: Self-pay | Admitting: Nurse Practitioner

## 2021-02-23 ENCOUNTER — Other Ambulatory Visit: Payer: Self-pay

## 2021-02-23 ENCOUNTER — Telehealth: Payer: Self-pay | Admitting: Nurse Practitioner

## 2021-02-23 VITALS — Ht 66.0 in | Wt 234.9 lb

## 2021-02-23 DIAGNOSIS — E1165 Type 2 diabetes mellitus with hyperglycemia: Secondary | ICD-10-CM

## 2021-02-23 DIAGNOSIS — B379 Candidiasis, unspecified: Secondary | ICD-10-CM | POA: Diagnosis not present

## 2021-02-23 DIAGNOSIS — F411 Generalized anxiety disorder: Secondary | ICD-10-CM

## 2021-02-23 DIAGNOSIS — I10 Essential (primary) hypertension: Secondary | ICD-10-CM

## 2021-02-23 DIAGNOSIS — K581 Irritable bowel syndrome with constipation: Secondary | ICD-10-CM | POA: Diagnosis not present

## 2021-02-23 MED ORDER — FLUCONAZOLE 150 MG PO TABS: ORAL_TABLET | ORAL | 1 refills | Status: DC

## 2021-02-23 MED ORDER — EMPAGLIFLOZIN 10 MG PO TABS: 10.0000 mg | ORAL_TABLET | Freq: Every day | ORAL | 1 refills | Status: DC

## 2021-02-23 NOTE — Progress Notes (Signed)
Virtual Visit via Telephone Note  I connected with Lisa Gutierrez on 03/05/21 at  1:10 PM EST by telephone and verified that I am speaking with the correct person using two identifiers.  Location: Patient: home Provider: [place] [facility] at [facility]     I discussed the limitations, risks, security and privacy concerns of performing an evaluation and management service by telephone and the availability of in person appointments. I also discussed with the patient that there may be a patient responsible charge related to this service. The patient expressed understanding and agreed to proceed.   History of Present Illness: The patient presents for a follow-up visit.  Has been having some issues with taking Ozempic at 0.25 mg dose.  Has caused her some negative side effects.  Side effects include nausea, stomach pain, and feeling of fatigue.  She has been advised to stop taking this medication.  With blood sugar trying to slightly more elevated prior to addition of Ozempic, need to add more coverage for blood sugar.  Invokana has been tried in the past.  Caused her yeast infection.  She continues to take Janumet 50/1000 mg tablets twice daily.   Observations/Objective:  The patient is alert and oriented. She is pleasant and answers all questions appropriately. Breathing is non-labored. She is in no acute distress at this time.    Today's Vitals   02/23/21 1309  Weight: 234 lb 14.4 oz (106.5 kg)  Height: 5\' 6"  (1.676 m)   Body mass index is 37.91 kg/m.   Assessment and Plan:   Follow Up Instructions: 1. Type 2 diabetes mellitus with hyperglycemia, without long-term current use of insulin (HCC) Trial of Jardiance 10 mg tablets daily.  Continue Janumet 50/1000 mg tablets twice daily.  Limit intake of carbohydrates and foods which are high in sugar.  Recommended addition of low impact exercise into her daily routine.  We will recheck hemoglobin A1c at next visit. -  empagliflozin (JARDIANCE) 10 MG TABS tablet; Take 1 tablet (10 mg total) by mouth daily before breakfast.  Dispense: 30 tablet; Refill: 1  2. Yeast infection Diflucan 100 mg may be taken once every 3 days as needed for yeast infection. - fluconazole (DIFLUCAN) 150 MG tablet; Take 1 tablet by mouth every 3 days.  Dispense: 5 tablet; Refill: 1  3. Essential hypertension Generally stable.  Continue blood pressure medication as prescribed.  4. Irritable bowel syndrome with constipation Continue Linzess daily.  We will review paperwork for her patient assistance program and recently to pharmaceutical company.  5. GAD (generalized anxiety disorder) Continue regular visits with psychiatry and counseling services as scheduled.   I discussed the assessment and treatment plan with the patient. The patient was provided an opportunity to ask questions and all were answered. The patient agreed with the plan and demonstrated an understanding of the instructions.   The patient was advised to call back or seek an in-person evaluation if the symptoms worsen or if the condition fails to improve as anticipated.  I provided 15 minutes of non-face-to-face time during this encounter.   Ronnell [place], NP

## 2021-02-23 NOTE — Telephone Encounter (Signed)
Patient states that read online that the Ozempic can cause abnormal B12. She would like to know if this is true. AS, CMA

## 2021-02-23 NOTE — Telephone Encounter (Signed)
Called pt she had question about her joints from her appointed she had earlier

## 2021-02-23 NOTE — Progress Notes (Signed)
Virtual Visit via Video Note  I connected with Lisa Gutierrez on 02/28/21 at  9:30 AM EST by a video enabled telemedicine application and verified that I am speaking with the correct person using two identifiers.  Location: Patient: home Provider: office Persons participated in the visit- patient, provider    I discussed the limitations of evaluation and management by telemedicine and the availability of in person appointments. The patient expressed understanding and agreed to proceed.     I discussed the assessment and treatment plan with the patient. The patient was provided an opportunity to ask questions and all were answered. The patient agreed with the plan and demonstrated an understanding of the instructions.   The patient was advised to call back or seek an in-person evaluation if the symptoms worsen or if the condition fails to improve as anticipated.  I provided 22 minutes of non-face-to-face time during this encounter.   [name], MD    [facility] MD/PA/NP OP Progress Note  02/28/2021 10:00 AM Lisa Gutierrez  MRN:  [id number]  Chief Complaint:  Chief Complaint   Follow-up; Depression    HPI:  This is a follow-up appointment for depression and anxiety.  She states that she feels sad, and was sad on birthday, thinking about what she had accomplished in her life.  She talks about the frustration again her husband, who has been hateful to her.  She also has worsening in pain, although she tries to do what she can do.  She talks about an example of her unable to do things when she tried to attend her granddaughter's graduation.  She attributes this to her medication for diabetes, and it was switched yesterday.  She always enjoys taking care of her animals.  She states that she has been on venlafaxine 112.5 mg after being advised by the pharmacy to try this dose instead of 150.  She feels groomed and groomed, and she is unsure if this is due to side effect.  She  feels sad, crying, although she denies SI.  Although she was initially willing to try higher dose of venlafaxine, she later states that she prefers to stay at the current dose as she is very concerned about possible side effect of hypertension.  She first would like to focus on her diabetes/pain to get better control.  She has middle insomnia.  She does not have any snoring.  She feels fatigue.  She has good appetite.  She feels anxious and has panic attacks.  Although she initially asks if Xanax could be taking more frequently, she later verbalized understanding to stick with the same frequency.   Daily routine: takes care of her dogs, cats, house chores Exercise: unable to do due to knee pain Employment:  Support: none (talks with her oldest granddaughter, who has left to college) Household: husband Marital status: married for 56 years  Number of children: 2 sons.     Visit Diagnosis:    ICD-10-CM   1. MDD (major depressive disorder), recurrent episode, moderate (HCC)  F33.1     2. Anxiety  F41.9       Past Psychiatric History: Please see initial evaluation for full details. I have reviewed the history. No updates at this time.     Past Medical History:  Past Medical History:  Diagnosis Date   Anxiety    Depression    Depression    Phreesia 07/02/2020   Diabetes mellitus without complication (HCC)    GERD (gastroesophageal reflux disease)  Hypertension    IBS (irritable bowel syndrome)    Neuromuscular disorder [facility])     Past Surgical History:  Procedure Laterality Date   BREAST EXCISIONAL BIOPSY     CESAREAN SECTION N/A    Phreesia 07/02/2020   CHOLECYSTECTOMY     EXCISION / BIOPSY BREAST / NIPPLE / DUCT Right 1973   duct removed   SPINE SURGERY N/A    Phreesia 07/02/2020   TUBAL LIGATION N/A    Phreesia 07/02/2020    Family Psychiatric History: Please see initial evaluation for full details. I have reviewed the history. No updates at this time.     Family  History:  Family History  Problem Relation Age of Onset   Diabetes Mother    Hypertension Mother    Coronary artery disease Father    Glaucoma Father    Breast cancer Neg Hx     Social History:  Social History   Socioeconomic History   Marital status: Married    Spouse name: Not on file   Number of children: Not on file   Years of education: Not on file   Highest education level: Not on file  Occupational History   Not on file  Tobacco Use   Smoking status: Former   Smokeless tobacco: Never  Substance and Sexual Activity   Alcohol use: No    Alcohol/week: 0.0 standard drinks   Drug use: Never   Sexual activity: Not Currently    Partners: Male  Other Topics Concern   Not on file  Social History Narrative   Not on file   Social Determinants of Health   Financial Resource Strain: Not on file  Food Insecurity: Not on file  Transportation Needs: Not on file  Physical Activity: Not on file  Stress: Not on file  Social Connections: Not on file    Allergies:  Allergies  Allergen Reactions   Aspirin Other (See Comments)    GI upset   Ibuprofen Other (See Comments)    GERD   Levofloxacin Other (See Comments)    Weight gain   Meloxicam Other (See Comments)    GI upset   Morphine Other (See Comments)   Naprosyn  [Naproxen] Other (See Comments)    GI upset   Nsaids Other (See Comments)    GI upset   Quinolones    Robinul  [Glycopyrrolate] Nausea And Vomiting   Penicillin V Potassium Rash   Sulfa Antibiotics Rash    Metabolic Disorder Labs: Lab Results  Component Value Date   HGBA1C 7.8 (A) 01/03/2021   No results found for: PROLACTIN No results found for: CHOL, TRIG, HDL, CHOLHDL, VLDL, LDLCALC No results found for: TSH  Therapeutic Level Labs: No results found for: LITHIUM No results found for: VALPROATE No components found for:  CBMZ  Current Medications: Current Outpatient Medications  Medication Sig Dispense Refill   acetaminophen (TYLENOL)  500 MG tablet Take 2 tablets by mouth as needed.     acetic acid-hydrocortisone (VOSOL-HC) OTIC solution Place 4 drops into the right ear 2 (two) times daily. 10 mL 0   ALPRAZolam (XANAX) 0.5 MG tablet Take 1 tablet (0.5 mg total) by mouth daily as needed for anxiety. 30 tablet 1   Bioflavonoid Products (VITAMIN C) CHEW Chew by mouth. With vitamin D and elderberry     Calcium Carbonate (CALCIUM 500 PO) Take by mouth.     empagliflozin (JARDIANCE) 10 MG TABS tablet Take 1 tablet (10 mg total) by mouth daily before breakfast.  30 tablet 1   Flaxseed, Linseed, (FLAXSEED OIL PO) Take 1,500 mg by mouth 2 (two) times daily.     fluconazole (DIFLUCAN) 150 MG tablet Take 1 tablet by mouth every 3 days. 5 tablet 1   glucose blood (ACCU-CHEK SMARTVIEW) test strip 1 each by Other route as needed for other. Use as instructed 200 each 11   LINZESS 145 MCG CAPS capsule TAKE 1 CAPSULE BY MOUTH EVERY DAY 30 capsule 2   meclizine (ANTIVERT) 25 MG tablet Take 1 tablet (25 mg total) by mouth 2 (two) times daily as needed for dizziness. 60 tablet 1   metoprolol succinate (TOPROL-XL) 25 MG 24 hr tablet Take 2 tablets (50 mg total) by mouth daily. 180 tablet 1   Multiple Vitamins-Minerals (MULTIVITAMIN ADULT PO) Take 1 tablet by mouth daily.     nystatin (MYCOSTATIN/NYSTOP) powder APPLY TOPICALLY TWICE A DAY 60 g 2   nystatin ointment (MYCOSTATIN) Apply 1 application topically 2 (two) times daily. 30 g 1   pantoprazole (PROTONIX) 40 MG tablet Take 1 tablet (40 mg total) by mouth 2 (two) times daily. 180 tablet 1   sitaGLIPtin-metformin (JANUMET) 50-1000 MG tablet Take 1 tablet by mouth 2 (two) times daily with a meal. 180 tablet 1   telmisartan-hydrochlorothiazide (MICARDIS HCT) 80-25 MG tablet TAKE 1 TABLET BY MOUTH EVERY DAY 90 tablet 1   venlafaxine XR (EFFEXOR-XR) 37.5 MG 24 hr capsule Total of 112.5 mg daily. Take along with 75 mg cap 30 capsule 0   venlafaxine XR (EFFEXOR-XR) 75 MG 24 hr capsule Take 1 capsule (75  mg total) by mouth daily. Total of 112.5 mg daily. Take along with 37.5 mg cap 30 capsule 0   No current facility-administered medications for this visit.     Musculoskeletal: Strength & Muscle Tone:  N/A Gait & Station:  N/A Patient leans: N/A  Psychiatric Specialty Exam: Review of Systems  Psychiatric/Behavioral:  Positive for decreased concentration, dysphoric mood and sleep disturbance. Negative for agitation, behavioral problems, confusion, hallucinations, self-injury and suicidal ideas. The patient is nervous/anxious. The patient is not hyperactive.   All other systems reviewed and are negative.  There were no vitals taken for this visit.There is no height or weight on file to calculate BMI.  General Appearance: Fairly Groomed  Eye Contact:  Good  Speech:  Clear and Coherent  Volume:  Normal  Mood:  Anxious and Depressed  Affect:  Appropriate, Congruent, and euthymic  Thought Process:  Coherent  Orientation:  Full (Time, Place, and Person)  Thought Content: Logical   Suicidal Thoughts:  No  Homicidal Thoughts:  No  Memory:  Immediate;   Good  Judgement:  Good  Insight:  Good  Psychomotor Activity:  Normal  Concentration:  Concentration: Good and Attention Span: Good  Recall:  Good  Fund of Knowledge: Good  Language: Good  Akathisia:  No  Handed:  Right  AIMS (if indicated): not done  Assets:  Communication Skills Desire for Improvement  ADL's:  Intact  Cognition: WNL  Sleep:  Poor   Screenings: GAD-7    Flowsheet Row Office Visit from 02/23/2021 in [place] at [facility] Visit from 12/09/2020 in [place] at [facility]  Total GAD-7 Score 6 [place] from 02/12/2020 in [facility] [place] from 02/06/2018 in [facility]  Total Score (max 30 points ) 29 30  [place] Office Visit from 02/23/2021 in [facility]  Primary Care at [place] from 01/18/2021 in [place] Office Visit from 12/09/2020 in [place] at [place] from 09/28/2020 in [place] Visit from 08/22/2020 in [place] at [facility]  PHQ-2 Total Score 1 5 5 2 2   PHQ-9 Total Score 5 21 21  -- [place] Counselor from 02/10/2021 in [place] from 12/22/2020 in [place] Video Visit from 11/23/2020 in [place] No Risk No Risk No Risk        Assessment and Plan:  Lisa Gutierrez is a 70 y.o. year old female with a history of depression, hypertension, diabetes, GERD, who presents for follow up appointment for below.    1. MDD (major depressive disorder), recurrent episode, moderate ([place]) 2. Anxiety # r/o PTSD She continues to report depressive symptoms and anxiety despite up titration of venlafaxine.  Psychosocial stressors includes marital conflict, conflict with her son, and recent laving of her granddaughter for college, who she has good connection with.  Other psychosocial stressors includes knee pain.  Although she reports limited support/social connection, she is willing to work on daily activity to improve her life, and is willing to continue therapy.  Although it was discussed about up titration of venlafaxine to optimize treatment, she later states that she would rather stay on the current dose as she is concerned about potential side effect.  Will continue current dose of venlafaxine to target depression and anxiety.  Will continue Xanax as needed for anxiety.   Plan Continue venlafaxine 112.5 mg daily - monitor weight gain Continue xanax 0.5 mg daily as needed for anxiety  Next appointment- 1/3 at 11:30 for 30 mins, video  Past trials of medication: sertraline, citalopram,  lexapro, fluoxetine    The patient demonstrates the following risk factors for suicide: Chronic risk factors for suicide include: psychiatric disorder of depression and chronic pain. Acute risk factors for suicide include: family or marital conflict. Protective factors for this patient include: hope for the future. Considering these factors, the overall suicide risk at this point appears to be low. Patient is appropriate for outpatient follow up.       [name], MD 02/28/2021, 10:00 AM

## 2021-02-24 ENCOUNTER — Other Ambulatory Visit: Payer: Self-pay | Admitting: Nurse Practitioner

## 2021-02-24 DIAGNOSIS — I10 Essential (primary) hypertension: Secondary | ICD-10-CM

## 2021-02-27 ENCOUNTER — Telehealth: Payer: Self-pay

## 2021-02-27 ENCOUNTER — Other Ambulatory Visit: Payer: Self-pay | Admitting: Psychiatry

## 2021-02-27 MED ORDER — ALPRAZOLAM 0.5 MG PO TABS: 0.5000 mg | ORAL_TABLET | Freq: Every day | ORAL | 1 refills | Status: DC | PRN

## 2021-02-27 NOTE — Telephone Encounter (Signed)
Ordered Xanax. She should have enough venlafaxine until the next visit.

## 2021-02-27 NOTE — Telephone Encounter (Signed)
pt called left message that she needs a refill on the effexor and on xanax.

## 2021-02-28 ENCOUNTER — Other Ambulatory Visit: Payer: Self-pay

## 2021-02-28 ENCOUNTER — Telehealth (INDEPENDENT_AMBULATORY_CARE_PROVIDER_SITE_OTHER): Payer: PPO | Admitting: Psychiatry

## 2021-02-28 ENCOUNTER — Encounter: Payer: Self-pay | Admitting: Psychiatry

## 2021-02-28 DIAGNOSIS — F419 Anxiety disorder, unspecified: Secondary | ICD-10-CM | POA: Diagnosis not present

## 2021-02-28 DIAGNOSIS — F331 Major depressive disorder, recurrent, moderate: Secondary | ICD-10-CM | POA: Diagnosis not present

## 2021-02-28 MED ORDER — VENLAFAXINE HCL ER 37.5 MG PO CP24: 112.5000 mg | ORAL_CAPSULE | Freq: Every day | ORAL | 0 refills | Status: DC

## 2021-02-28 NOTE — Patient Instructions (Signed)
Continue venlafaxine 112.5 mg daily  Continue xanax 0.5 mg daily as needed for anxiety  Next appointment- 1/3 at 11:30

## 2021-03-01 ENCOUNTER — Other Ambulatory Visit (HOSPITAL_COMMUNITY): Payer: Self-pay | Admitting: Psychiatry

## 2021-03-05 DIAGNOSIS — B379 Candidiasis, unspecified: Secondary | ICD-10-CM | POA: Insufficient documentation

## 2021-03-08 ENCOUNTER — Encounter: Payer: PPO | Attending: Nurse Practitioner | Admitting: Dietician

## 2021-03-08 ENCOUNTER — Other Ambulatory Visit: Payer: Self-pay

## 2021-03-08 ENCOUNTER — Telehealth: Payer: PPO | Admitting: Dietician

## 2021-03-08 DIAGNOSIS — Z6837 Body mass index (BMI) 37.0-37.9, adult: Secondary | ICD-10-CM | POA: Insufficient documentation

## 2021-03-08 DIAGNOSIS — E1165 Type 2 diabetes mellitus with hyperglycemia: Secondary | ICD-10-CM | POA: Insufficient documentation

## 2021-03-13 ENCOUNTER — Telehealth: Payer: Self-pay | Admitting: Nurse Practitioner

## 2021-03-13 ENCOUNTER — Other Ambulatory Visit: Payer: Self-pay | Admitting: Nurse Practitioner

## 2021-03-13 DIAGNOSIS — K581 Irritable bowel syndrome with constipation: Secondary | ICD-10-CM

## 2021-03-13 MED ORDER — LINZESS 145 MCG PO CAPS: 145.0000 ug | ORAL_CAPSULE | Freq: Every day | ORAL | 3 refills | Status: DC

## 2021-03-13 NOTE — Telephone Encounter (Signed)
Patient is requesting a call from you, can you please advise? [phone]

## 2021-03-13 NOTE — Telephone Encounter (Signed)
Called and spoke with pt

## 2021-03-14 ENCOUNTER — Telehealth: Payer: Self-pay | Admitting: Nurse Practitioner

## 2021-03-14 NOTE — Telephone Encounter (Signed)
The referral to PT has been placed. AS< CMA

## 2021-03-14 NOTE — Telephone Encounter (Signed)
Left message letting her know referral had been placed.

## 2021-03-14 NOTE — Telephone Encounter (Signed)
Patient called regarding referral to physical therapy and stated that she has called the office in which a referral was sent however they told her they never received it. Can it please be sent again and can you call her back? [phone]

## 2021-03-20 ENCOUNTER — Ambulatory Visit (INDEPENDENT_AMBULATORY_CARE_PROVIDER_SITE_OTHER): Payer: PPO | Admitting: Licensed Clinical Social Worker

## 2021-03-20 ENCOUNTER — Telehealth: Payer: Self-pay | Admitting: Nurse Practitioner

## 2021-03-20 DIAGNOSIS — F331 Major depressive disorder, recurrent, moderate: Secondary | ICD-10-CM | POA: Diagnosis not present

## 2021-03-20 DIAGNOSIS — F419 Anxiety disorder, unspecified: Secondary | ICD-10-CM | POA: Diagnosis not present

## 2021-03-20 NOTE — Progress Notes (Signed)
Virtual Visit via Video Note  I connected with Lisa Gutierrez on 03/20/21 at 10:00 AM EST by a video enabled telemedicine application and verified that I am speaking with the correct person using two identifiers.  Video connection was lost when less than 50% of the duration of the visit was complete, at which time the remainder of the visit was completed via audio only.   Location: Patient: home Provider: remote office [place], [place])   I discussed the limitations of evaluation and management by telemedicine and the availability of in person appointments. The patient expressed understanding and agreed to proceed.  I discussed the assessment and treatment plan with the patient. The patient was provided an opportunity to ask questions and all were answered. The patient agreed with the plan and demonstrated an understanding of the instructions.   The patient was advised to call back or seek an in-person evaluation if the symptoms worsen or if the condition fails to improve as anticipated.  I provided 60 minutes of non-face-to-face time during this encounter.   Cambelle Suchecki R Nalea Salce, LCSW   THERAPIST PROGRESS NOTE  Session Time: 10-11a  Participation Level: Active  Behavioral Response: Neat and Well GroomedAlertAnxious, Depressed, and Irritable  Type of Therapy: Individual Therapy  Treatment Goals addressed:   Problem: Decrease depressive symptoms and improve levels of effective functioning Goal: LTG: Reduce frequency, intensity, and duration of depression symptoms as evidenced by: SSB input needed on appropriate metric Outcome: Not Progressing Note: Progress is fluctuating/intermittent  Goal: STG: @PREFFIRSTNAME @ will participate in at least 80% of scheduled individual psychotherapy sessions Outcome: Progressing Note: Pt on time and engaged throughout session  Interventions:  Intervention: Give positive reinforcement and praise  Intervention: Assist patient to  identify own strengths and abilities  Intervention: Encourage patient to identify triggers  Intervention: Encourage changes to improve social interaction    Summary: Lisa Gutierrez is a 70 y.o. female who presents with symptoms consistent with depression. Pt reports that her overall mood is stable but that she is still feeling more down and depressed. Pt feels that she is not able to engage in activities that she wants to engage in because her husband doesn't want to. "He helps all the seniors every day and he just doesn't have any energy when he comes home to me". Pt states that she has tried communicating with him about her needs and getting out of the house, but he is not interested. Discussed negative versus positive perspectives with family, feelings, situations, and thoughts. Pt feels that husband is continuing to be very critical of her behaviors and sense of self. Pt feels isolated at home--there is nobody there to talk to besides him and pt feels he is constantly criticizing her. Reviewed CBT based techniques for disarming when triggered. Pt reflects understanding.  Explored past trauma and overall psychological impact. Pt is able to piece together negative self feelings in present and how its linked to traumas from the past. Helped pt identify strengths in self.   Continued recommendations are as follows: self care behaviors, positive social engagements, focusing on overall work/home/life balance, and focusing on positive physical and emotional wellness.   Suicidal/Homicidal: Yes--pt reports that she has had a fleeting thought with NO plans or intent to follow through. Pt states her religious faith and her love for her grandkids keeps her from thinking about this. "Please don't think I would ever do it"  Therapist Response: Pt is continuing to apply interventions learned in session into daily life situations.  Pt is currently on track to meet goals utilizing interventions mentioned  above. Personal growth and progress is currently fluctuating. Treatment to continue as indicated.   Pt is continuing to have some depression and anxiety symptoms and is utilizing coping skills when she is experiencing things. Pt is aware that there may environmental barriers impacting activities that could be beneficial to her. Due to this, pt spends a lot of time isolated and at home.   Plan: Return again in 4 weeks.  Diagnosis: Axis I: MDD, recurrent    Axis II: No diagnosis    Lisa Bo Charletha Dalpe, LCSW 03/20/2021

## 2021-03-20 NOTE — Telephone Encounter (Signed)
Called pt referral is faxed

## 2021-03-20 NOTE — Telephone Encounter (Signed)
Patient called the PT dept at the [facility] and they told patient there was no referral sent over-fax number is [phone] can you please advise? [phone]

## 2021-03-20 NOTE — Plan of Care (Signed)
  Problem: Decrease depressive symptoms and improve levels of effective functioning Goal: LTG: Reduce frequency, intensity, and duration of depression symptoms as evidenced by: SSB input needed on appropriate metric Outcome: Not Progressing Note: Progress is fluctuating/intermittent Goal: STG: @PREFFIRSTNAME @ will participate in at least 80% of scheduled individual psychotherapy sessions Outcome: Progressing Note: Pt on time and engaged throughout session Intervention: Give positive reinforcement and praise Intervention: Assist patient to identify own strengths and abilities Intervention: Encourage patient to identify triggers Intervention: Encourage changes to improve social interaction

## 2021-03-21 ENCOUNTER — Other Ambulatory Visit (HOSPITAL_COMMUNITY): Payer: Self-pay

## 2021-03-22 ENCOUNTER — Telehealth: Payer: Self-pay | Admitting: Nurse Practitioner

## 2021-03-22 ENCOUNTER — Other Ambulatory Visit: Payer: Self-pay | Admitting: Nurse Practitioner

## 2021-03-22 ENCOUNTER — Telehealth: Payer: Self-pay

## 2021-03-22 DIAGNOSIS — G8929 Other chronic pain: Secondary | ICD-10-CM

## 2021-03-22 DIAGNOSIS — M25562 Pain in left knee: Secondary | ICD-10-CM

## 2021-03-22 NOTE — Telephone Encounter (Signed)
While on lunch Elmyra Ricks with [facility] physical therapy called and left a vm stating the diagnosis was left off of the referral that was faxed over and if you can please fax it back with the code to [phone] and if you need to speak with her please call [phone].

## 2021-03-22 NOTE — Telephone Encounter (Signed)
Fax has been sent

## 2021-03-22 NOTE — Telephone Encounter (Signed)
[facility] called [facility] and they stated they cannot accept old referrals and will not schedule from the one that was faxed over. Can a new one be placed? Please advise [phone]

## 2021-03-22 NOTE — Telephone Encounter (Signed)
returned call to get more info no answer left a message to call office back

## 2021-03-22 NOTE — Telephone Encounter (Signed)
Resent fax referral back to [facility] PT

## 2021-03-22 NOTE — Telephone Encounter (Signed)
pt called left a message that she is having some mood issues.

## 2021-03-23 NOTE — Telephone Encounter (Signed)
Advise her to take xanax 0.5 mg every other day for one week. If she is able to come off, she can discontinue it afterwards. Please ask if she is interested in trying venlafaxine 150 mg daily.

## 2021-03-23 NOTE — Telephone Encounter (Signed)
pt called back she states that her therapist told her that she needed to come off the xanax because it has been linked to memory issues and so she wants to come off of that and she states she needs something to help her sleep.

## 2021-03-27 NOTE — Telephone Encounter (Signed)
she agreed that she would go up on the 37,5mg  take 2 and then the 75mg  and i told her i would call her next week and shee how she doing with that.  she states that her husband is stressing her out and is mean to her.

## 2021-03-27 NOTE — Telephone Encounter (Signed)
Noted, thanks!

## 2021-03-27 NOTE — Telephone Encounter (Signed)
pt called back she states she already on the venlafaxine.  She states that she would rather speak with you and she needs something to make her sleep.

## 2021-03-30 ENCOUNTER — Other Ambulatory Visit: Payer: Self-pay | Admitting: Nurse Practitioner

## 2021-03-30 DIAGNOSIS — B372 Candidiasis of skin and nail: Secondary | ICD-10-CM

## 2021-04-03 ENCOUNTER — Telehealth: Payer: Self-pay | Admitting: Nurse Practitioner

## 2021-04-03 NOTE — Telephone Encounter (Signed)
Patient called requesting to speak with you about her patient assistance program with her medication. Can you please call her back? [phone]

## 2021-04-03 NOTE — Telephone Encounter (Signed)
Ok thanks 

## 2021-04-05 ENCOUNTER — Telehealth: Payer: PPO | Admitting: Dietician

## 2021-04-12 ENCOUNTER — Encounter: Payer: PPO | Admitting: Nurse Practitioner

## 2021-04-12 ENCOUNTER — Telehealth: Payer: Self-pay | Admitting: Nurse Practitioner

## 2021-04-12 NOTE — Telephone Encounter (Signed)
Patient wants to speak with you, please call her at [phone]

## 2021-04-13 ENCOUNTER — Other Ambulatory Visit: Payer: Self-pay | Admitting: Nurse Practitioner

## 2021-04-13 DIAGNOSIS — G8929 Other chronic pain: Secondary | ICD-10-CM

## 2021-04-13 DIAGNOSIS — M6530 Trigger finger, unspecified finger: Secondary | ICD-10-CM

## 2021-04-13 NOTE — Telephone Encounter (Signed)
I put a new referral to [facility] physical therapy for chronic left knee pain and trigger finger abnormality of right hand.
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
## 2021-04-18 ENCOUNTER — Other Ambulatory Visit: Payer: Self-pay

## 2021-04-18 ENCOUNTER — Encounter: Payer: Self-pay | Admitting: Psychiatry

## 2021-04-18 ENCOUNTER — Telehealth (INDEPENDENT_AMBULATORY_CARE_PROVIDER_SITE_OTHER): Payer: PPO | Admitting: Psychiatry

## 2021-04-18 DIAGNOSIS — F419 Anxiety disorder, unspecified: Secondary | ICD-10-CM

## 2021-04-18 DIAGNOSIS — F331 Major depressive disorder, recurrent, moderate: Secondary | ICD-10-CM

## 2021-04-18 MED ORDER — VENLAFAXINE HCL ER 150 MG PO CP24: 150.0000 mg | ORAL_CAPSULE | Freq: Every day | ORAL | 0 refills | Status: DC

## 2021-04-18 MED ORDER — BUSPIRONE HCL 5 MG PO TABS: 5.0000 mg | ORAL_TABLET | Freq: Two times a day (BID) | ORAL | 1 refills | Status: DC

## 2021-04-18 MED ORDER — ALPRAZOLAM 0.5 MG PO TABS: 0.5000 mg | ORAL_TABLET | Freq: Every day | ORAL | 0 refills | Status: DC | PRN

## 2021-04-18 NOTE — Patient Instructions (Signed)
Continue venlafaxine 150 mg daily Start BuSpar 5 mg twice a day Continue Xanax 0.5 mg daily as needed for anxiety Next appointment :2/9 at 11 AM  From this to contact the patient to make a follow up appointment for therapy

## 2021-04-25 ENCOUNTER — Other Ambulatory Visit (HOSPITAL_COMMUNITY): Payer: Self-pay | Admitting: Psychiatry

## 2021-04-26 ENCOUNTER — Telehealth: Payer: Self-pay | Admitting: Nurse Practitioner

## 2021-04-26 ENCOUNTER — Encounter: Payer: PPO | Admitting: Nurse Practitioner

## 2021-04-26 NOTE — Telephone Encounter (Signed)
Patient is requesting a call regarding the [place] and Jardiance.

## 2021-04-26 NOTE — Telephone Encounter (Signed)
Called pt she's coming in with more paperwork to be faxed

## 2021-04-28 ENCOUNTER — Other Ambulatory Visit: Payer: Self-pay | Admitting: Nurse Practitioner

## 2021-04-28 ENCOUNTER — Telehealth: Payer: Self-pay | Admitting: Nurse Practitioner

## 2021-04-28 DIAGNOSIS — E1165 Type 2 diabetes mellitus with hyperglycemia: Secondary | ICD-10-CM

## 2021-04-28 NOTE — Telephone Encounter (Signed)
Can you please call her back regarding her referral for PT? [phone]

## 2021-05-02 NOTE — Telephone Encounter (Signed)
Will talk to her about PT referral during her visit 05/04/2021

## 2021-05-03 ENCOUNTER — Telehealth (INDEPENDENT_AMBULATORY_CARE_PROVIDER_SITE_OTHER): Payer: PPO | Admitting: Nurse Practitioner

## 2021-05-03 ENCOUNTER — Other Ambulatory Visit: Payer: Self-pay

## 2021-05-03 ENCOUNTER — Encounter: Payer: Self-pay | Admitting: Nurse Practitioner

## 2021-05-03 VITALS — Ht 66.0 in | Wt 234.9 lb

## 2021-05-03 DIAGNOSIS — K581 Irritable bowel syndrome with constipation: Secondary | ICD-10-CM

## 2021-05-03 DIAGNOSIS — I10 Essential (primary) hypertension: Secondary | ICD-10-CM

## 2021-05-03 DIAGNOSIS — Z6837 Body mass index (BMI) 37.0-37.9, adult: Secondary | ICD-10-CM | POA: Diagnosis not present

## 2021-05-03 DIAGNOSIS — B379 Candidiasis, unspecified: Secondary | ICD-10-CM | POA: Diagnosis not present

## 2021-05-03 DIAGNOSIS — E1165 Type 2 diabetes mellitus with hyperglycemia: Secondary | ICD-10-CM

## 2021-05-03 MED ORDER — FLUCONAZOLE 150 MG PO TABS: ORAL_TABLET | ORAL | 1 refills | Status: DC
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
## 2021-05-10 ENCOUNTER — Telehealth: Payer: Self-pay | Admitting: Nurse Practitioner

## 2021-05-10 NOTE — Telephone Encounter (Signed)
Called pt she is advised that the referral was updated

## 2021-05-10 NOTE — Telephone Encounter (Signed)
Patient would like you to call her back. AS, CMA

## 2021-05-11 ENCOUNTER — Encounter: Payer: PPO | Admitting: Nurse Practitioner

## 2021-05-13 ENCOUNTER — Other Ambulatory Visit: Payer: Self-pay | Admitting: Nurse Practitioner

## 2021-05-13 DIAGNOSIS — K581 Irritable bowel syndrome with constipation: Secondary | ICD-10-CM

## 2021-05-15 DIAGNOSIS — Z6841 Body Mass Index (BMI) 40.0 and over, adult: Secondary | ICD-10-CM | POA: Diagnosis not present

## 2021-05-15 DIAGNOSIS — M654 Radial styloid tenosynovitis [de Quervain]: Secondary | ICD-10-CM | POA: Diagnosis not present

## 2021-05-15 DIAGNOSIS — E1165 Type 2 diabetes mellitus with hyperglycemia: Secondary | ICD-10-CM | POA: Diagnosis not present

## 2021-05-15 DIAGNOSIS — M25531 Pain in right wrist: Secondary | ICD-10-CM | POA: Diagnosis not present

## 2021-05-16 ENCOUNTER — Telehealth: Payer: Self-pay

## 2021-05-16 NOTE — Telephone Encounter (Signed)
Could you ask about how many 37.5 tabs she has? Advise her to take four tabs of 37.5 mg daily to make 150 mg total daily.

## 2021-05-16 NOTE — Telephone Encounter (Signed)
pt has two bottles of the 37.5mg  and does not want them to go to waste she wanted to know if you would send in 75mg  to go with the 37.5 so she doesn't waster her money.

## 2021-05-22 NOTE — Progress Notes (Signed)
Virtual Visit via Video Note  I connected with Lisa Gutierrez on 05/25/21 at 11:00 AM EST by a video enabled telemedicine application and verified that I am speaking with the correct person using two identifiers.  Location: Patient: home Provider: office Persons participated in the visit- patient, provider    I discussed the limitations of evaluation and management by telemedicine and the availability of in person appointments. The patient expressed understanding and agreed to proceed.    I discussed the assessment and treatment plan with the patient. The patient was provided an opportunity to ask questions and all were answered. The patient agreed with the plan and demonstrated an understanding of the instructions.   The patient was advised to call back or seek an in-person evaluation if the symptoms worsen or if the condition fails to improve as anticipated.  I provided 14 minutes of non-face-to-face time during this encounter.   [name], MD    [facility] MD/PA/NP OP Progress Note  05/25/2021 11:28 AM Lisa Gutierrez  MRN:  [id number]  Chief Complaint:  Chief Complaint   Follow-up; Depression    HPI:  - per chart review, she had DeQuervain's Injection on her right arm She states that she has been doing good except that she had a procedure in her hand.  She continues to have conflict with her husband.  She states that although she tries to enjoy things, it has been difficult as somebody is not enjoying.  Although she believes he should see somebody, he is adamant not to do so.   She occasionally feels doomed, thinking about many things.  She has been sleeping well.  She tends to eat more when she is stressed with her husband.  She tends to feel sleepy since starting BuSpar, although she denies any concerns about fall.  She denies SI.  She feels less anxious since starting medication.  She had a few panic attacks.  She takes Xanax at night when she is stressed due to her  husband. She is willing to see Ms. Hussami for therapy again.  She states that she started to have diaphoresis again, although it was ceased one time.  She is unsure if it has worsened since being started on BuSpar.  She feels comfortable to continue on the same medication regimen at this time.   Daily routine: takes care of her dogs, cats, house chores Exercise: unable to do due to knee pain Employment:  Support: none (talks with her oldest granddaughter, who has left to college) Household: husband Marital status: married for 78 years  Number of children: 2 sons.     Visit Diagnosis:    ICD-10-CM   1. MDD (major depressive disorder), recurrent episode, moderate (HCC)  F33.1     2. Anxiety  F41.9       Past Psychiatric History: Please see initial evaluation for full details. I have reviewed the history. No updates at this time.     Past Medical History:  Past Medical History:  Diagnosis Date   Anxiety    Depression    Depression    Phreesia 07/02/2020   Diabetes mellitus without complication (HCC)    GERD (gastroesophageal reflux disease)    Hypertension    IBS (irritable bowel syndrome)    Neuromuscular disorder [facility])     Past Surgical History:  Procedure Laterality Date   BREAST EXCISIONAL BIOPSY     CESAREAN SECTION N/A    Phreesia 07/02/2020   CHOLECYSTECTOMY     EXCISION /  BIOPSY BREAST / NIPPLE / DUCT Right 1973   duct removed   SPINE SURGERY N/A    Phreesia 07/02/2020   TUBAL LIGATION N/A    Phreesia 07/02/2020    Family Psychiatric History: Please see initial evaluation for full details. I have reviewed the history. No updates at this time.     Family History:  Family History  Problem Relation Age of Onset   Diabetes Mother    Hypertension Mother    Coronary artery disease Father    Glaucoma Father    Breast cancer Neg Hx     Social History:  Social History   Socioeconomic History   Marital status: Married    Spouse name: Not on file    Number of children: Not on file   Years of education: Not on file   Highest education level: Not on file  Occupational History   Not on file  Tobacco Use   Smoking status: Former   Smokeless tobacco: Never  Substance and Sexual Activity   Alcohol use: No    Alcohol/week: 0.0 standard drinks   Drug use: Never   Sexual activity: Not Currently    Partners: Male  Other Topics Concern   Not on file  Social History Narrative   Not on file   Social Determinants of Health   Financial Resource Strain: Not on file  Food Insecurity: Not on file  Transportation Needs: Not on file  Physical Activity: Not on file  Stress: Not on file  Social Connections: Not on file    Allergies:  Allergies  Allergen Reactions   Aspirin Other (See Comments)    GI upset   Ibuprofen Other (See Comments)    GERD   Levofloxacin Other (See Comments)    Weight gain   Meloxicam Other (See Comments)    GI upset   Morphine Other (See Comments)   Naprosyn  [Naproxen] Other (See Comments)    GI upset   Nsaids Other (See Comments)    GI upset   Quinolones    Robinul  [Glycopyrrolate] Nausea And Vomiting   Penicillin V Potassium Rash   Sulfa Antibiotics Rash    Metabolic Disorder Labs: Lab Results  Component Value Date   HGBA1C 7.8 (A) 01/03/2021   No results found for: PROLACTIN No results found for: CHOL, TRIG, HDL, CHOLHDL, VLDL, LDLCALC No results found for: TSH  Therapeutic Level Labs: No results found for: LITHIUM No results found for: VALPROATE No components found for:  CBMZ  Current Medications: Current Outpatient Medications  Medication Sig Dispense Refill   acetaminophen (TYLENOL) 500 MG tablet Take 2 tablets by mouth as needed.     acetic acid-hydrocortisone (VOSOL-HC) OTIC solution Place 4 drops into the right ear 2 (two) times daily. 10 mL 0   ALPRAZolam (XANAX) 0.5 MG tablet Take 1 tablet (0.5 mg total) by mouth daily as needed for anxiety. 30 tablet 0   Bioflavonoid Products  (VITAMIN C) CHEW Chew by mouth. With vitamin D and elderberry     busPIRone (BUSPAR) 5 MG tablet Take 1 tablet (5 mg total) by mouth 2 (two) times daily. 60 tablet 1   Calcium Carbonate (CALCIUM 500 PO) Take by mouth.     Flaxseed, Linseed, (FLAXSEED OIL PO) Take 1,500 mg by mouth 2 (two) times daily.     fluconazole (DIFLUCAN) 150 MG tablet Take 1 tablet by mouth every 3 days. 5 tablet 1   FLUZONE HIGH-DOSE QUADRIVALENT 0.7 ML SUSY  glucose blood (ACCU-CHEK SMARTVIEW) test strip 1 each by Other route as needed for other. Use as instructed 200 each 11   JARDIANCE 10 MG TABS tablet TAKE 1 TABLET BY MOUTH DAILY BEFORE BREAKFAST. 30 tablet 1   LINZESS 145 MCG CAPS capsule TAKE 1 CAPSULE BY MOUTH EVERY DAY 30 capsule 2   meclizine (ANTIVERT) 25 MG tablet Take 1 tablet (25 mg total) by mouth 2 (two) times daily as needed for dizziness. 60 tablet 1   metoprolol succinate (TOPROL-XL) 25 MG 24 hr tablet Take 2 tablets (50 mg total) by mouth daily. 180 tablet 1   Multiple Vitamins-Minerals (MULTIVITAMIN ADULT PO) Take 1 tablet by mouth daily.     nystatin (MYCOSTATIN/NYSTOP) powder APPLY TO AFFECTED AREA TWICE A DAY 60 g 3   nystatin ointment (MYCOSTATIN) Apply 1 application topically 2 (two) times daily. 30 g 1   pantoprazole (PROTONIX) 40 MG tablet Take 1 tablet (40 mg total) by mouth 2 (two) times daily. 180 tablet 1   sitaGLIPtin-metformin (JANUMET) 50-1000 MG tablet Take 1 tablet by mouth 2 (two) times daily with a meal. 180 tablet 1   telmisartan-hydrochlorothiazide (MICARDIS HCT) 80-25 MG tablet TAKE 1 TABLET BY MOUTH EVERY DAY 90 tablet 1   venlafaxine XR (EFFEXOR-XR) 150 MG 24 hr capsule Take 1 capsule (150 mg total) by mouth daily with breakfast. 90 capsule 0   No current facility-administered medications for this visit.     Musculoskeletal: Strength & Muscle Tone:  N/A Gait & Station:  N/A Patient leans: N/A  Psychiatric Specialty Exam: Review of Systems  Psychiatric/Behavioral:   Positive for decreased concentration and dysphoric mood. Negative for agitation, behavioral problems, confusion, hallucinations, self-injury, sleep disturbance and suicidal ideas. The patient is nervous/anxious. The patient is not hyperactive.   All other systems reviewed and are negative.  There were no vitals taken for this visit.There is no height or weight on file to calculate BMI.  General Appearance: Fairly Groomed  Eye Contact:  Good  Speech:  Clear and Coherent  Volume:  Normal  Mood:   doom  Affect:  Appropriate, Congruent, and calm  Thought Process:  Coherent  Orientation:  Full (Time, Place, and Person)  Thought Content: Logical   Suicidal Thoughts:  No  Homicidal Thoughts:  No  Memory:  Immediate;   Good  Judgement:  Good  Insight:  Fair  Psychomotor Activity:  Normal  Concentration:  Concentration: Good and Attention Span: Good  Recall:  Good  Fund of Knowledge: Good  Language: Good  Akathisia:  No  Handed:  Right  AIMS (if indicated): not done  Assets:  Communication Skills Desire for Improvement  ADL's:  Intact  Cognition: WNL  Sleep:  Good   Screenings: GAD-7    Flowsheet Row Video Visit from 05/03/2021 in [place] at [facility] Visit from 02/23/2021 in [place] at [place] Visit from 12/09/2020 in [place] at [facility]  Total GAD-7 Score 7 6 17       [place] from 02/12/2020 in [facility] [place] from 02/06/2018 in [facility]  Total Score (max 30 points ) 29 30      PHQ2-9    Flowsheet Row Video Visit from 05/03/2021 in [place] at [facility] Visit from 02/23/2021 in [place] at [place] from 01/18/2021 in [place] [facility]  Visit from 12/09/2020 in [place] at [place] from 09/28/2020 in [place]  PHQ-2 Total Score 1 1 5 5 2   PHQ-9 Total Score 7 5 21 21  --      Flowsheet Row Counselor from 03/20/2021 in [place] Counselor from 02/10/2021 in [place] Counselor from 12/22/2020 in [place] Low Risk No Risk No Risk        Assessment and Plan:  Lisa Gutierrez is a 71 y.o. year old female with a history of depression, hypertension, diabetes, GERD, who presents for follow up appointment for below.   1. MDD (major depressive disorder), recurrent episode, moderate ([place]) 2. Anxiety # r/o PTSD  She continues to report depressive symptoms in the context of marital conflict, although there has been slight improvement in anxiety since starting BuSpar.  Other recent psychosocial stressors includes procedure for her hand, knee pain , conflict with her son, and her daughter, who she used to have good relationship with.  Although she reports slight drowsiness and diaphoresis since starting BuSpar, she feels comfortable to stay on the same medication regimen at this time.  Will continue venlafaxine to target depression and anxiety.  Will continue Xanax as needed for anxiety at this time to hope to discontinue this medication in the future to avoid any potential side effect.  She is willing to have a follow-up appointment with her therapist; will arrange this.   Plan Continue venlafaxine 150 mg daily- monitor weight gain Continue BuSpar 5 mg twice a day- monitor diaphoresis, drowsiness Continue Xanax 0.5 mg daily as needed for anxiety Next appointment : 3/6 at 3 PM for 30 mins, video Front desk to contact the patient to make a follow up appointment for therapy with Ms. Kandice Moos   Past trials of medication: sertraline, citalopram, lexapro, fluoxetine    The patient demonstrates the following risk factors for suicide: Chronic risk factors for suicide  include: psychiatric disorder of depression and chronic pain. Acute risk factors for suicide include: family or marital conflict. Protective factors for this patient include: hope for the future. Considering these factors, the overall suicide risk at this point appears to be low. Patient is appropriate for outpatient follow up.      [name], MD 05/25/2021, 11:28 AM

## 2021-05-22 NOTE — Telephone Encounter (Signed)
Sure. Order of xanax was sent in the last time to last until her next appointment- could you verify with the pharmacy? Thanks.

## 2021-05-22 NOTE — Telephone Encounter (Signed)
pt states she needs a refill on the xanax . and thank you for saving her some extra money on the 75mg  instead takin the 4 tablets of the other she just didn't want them to go to waste.

## 2021-05-25 ENCOUNTER — Encounter: Payer: Self-pay | Admitting: Psychiatry

## 2021-05-25 ENCOUNTER — Telehealth (INDEPENDENT_AMBULATORY_CARE_PROVIDER_SITE_OTHER): Payer: PPO | Admitting: Psychiatry

## 2021-05-25 ENCOUNTER — Other Ambulatory Visit: Payer: Self-pay

## 2021-05-25 DIAGNOSIS — F419 Anxiety disorder, unspecified: Secondary | ICD-10-CM

## 2021-05-25 DIAGNOSIS — F331 Major depressive disorder, recurrent, moderate: Secondary | ICD-10-CM

## 2021-05-25 MED ORDER — ALPRAZOLAM 0.5 MG PO TABS: 0.5000 mg | ORAL_TABLET | Freq: Every day | ORAL | 0 refills | Status: DC | PRN

## 2021-05-25 NOTE — Patient Instructions (Signed)
Continue venlafaxine 150 mg daily Continue BuSpar 5 mg twice a day Continue Xanax 0.5 mg daily as needed for anxiety Next appointment : 3/6 at 3 PM

## 2021-05-28 ENCOUNTER — Other Ambulatory Visit: Payer: Self-pay | Admitting: Psychiatry

## 2021-05-30 ENCOUNTER — Encounter: Payer: PPO | Admitting: Nurse Practitioner

## 2021-06-06 DIAGNOSIS — M654 Radial styloid tenosynovitis [de Quervain]: Secondary | ICD-10-CM | POA: Diagnosis not present

## 2021-06-06 DIAGNOSIS — M1811 Unilateral primary osteoarthritis of first carpometacarpal joint, right hand: Secondary | ICD-10-CM | POA: Diagnosis not present

## 2021-06-08 ENCOUNTER — Encounter: Payer: PPO | Admitting: Nurse Practitioner

## 2021-06-08 ENCOUNTER — Encounter: Payer: Self-pay | Admitting: Nurse Practitioner

## 2021-06-14 ENCOUNTER — Encounter: Payer: PPO | Attending: Dietician | Admitting: Dietician

## 2021-06-14 ENCOUNTER — Ambulatory Visit: Payer: PPO | Admitting: Dietician

## 2021-06-14 ENCOUNTER — Other Ambulatory Visit: Payer: Self-pay | Admitting: Nurse Practitioner

## 2021-06-14 DIAGNOSIS — E1165 Type 2 diabetes mellitus with hyperglycemia: Secondary | ICD-10-CM | POA: Diagnosis not present

## 2021-06-14 DIAGNOSIS — Z713 Dietary counseling and surveillance: Secondary | ICD-10-CM | POA: Insufficient documentation

## 2021-06-14 DIAGNOSIS — E669 Obesity, unspecified: Secondary | ICD-10-CM

## 2021-06-14 DIAGNOSIS — Z6837 Body mass index (BMI) 37.0-37.9, adult: Secondary | ICD-10-CM | POA: Diagnosis not present

## 2021-06-14 DIAGNOSIS — Z1231 Encounter for screening mammogram for malignant neoplasm of breast: Secondary | ICD-10-CM

## 2021-06-14 NOTE — Progress Notes (Signed)
Medical Nutrition Therapy: Visit start time: 1100  end time: 1610  ?Assessment:  Diagnosis: Type 2 diabetes, obesity ?Medical history changes: limited use of right hand ?Psychosocial issues/ stress concerns: high stress level, feeling isolated, low self esteem per patient ? ? ?Progress and evaluation:  ?Patient reports recent struggle with decreased use of her right hand; she will be having surgery in the near future.  ?She has ongoing issues with lack of family support with making lifestyle changes and self worth. ?Experienced severe joint pain when taking Ozempic a few months ago; is now on Jardiance but voices concern it could be causing damage to her body based on comments from pharmacist. ?Dietary concern at this time is feeling hungry often, at times eating large portions. ?  ?Physical activity:  ? ?Dietary Intake:  ?Usual eating pattern includes 2-3 meals and ? snacks per day. ?Dining out frequency: ? meals per week. ? ?Breakfast: 1/4 bagel; banana with peanut butter ?Snack:  ?Lunch: salad with grilled chicken, vinaigrette; 1/2 ham sandwich; panko fish sticks and slaw ?Snack:  ?Supper: beans/ corn/ leafy greens/ cabbage/ squash/ okra with panko pan fried + chicken/ meat; cooks to husband's preferences; spaghetti with homemade sauce  ?Snack: goldfish; sugar free jello ?Beverages: water, low sugar juice drinks, occasional sweet tea ? ?Nutrition Care Education: ?Topics covered:  ?Basic nutrition: basic food groups, appropriate nutrient balance ?Weight control: reviewed progress since previous visit, hunger and portion control strategies; dealing with emotions that lead to overeating or less healthy food choices. ?Diabetes:  role of fiber, protein in blood sugar and hunger control; limiting sugar in beverages and options for sugar substitutes ? ? ?Nutritional Diagnosis:  [place]-2.2 Altered nutrition-related laboratory As related to diabetes type 2.  As evidenced by recent HbA1C of 7.8%. ?[place]-3.3 Overweight/obesity As  related to excess calories, inadequate physical activity, stress/ anxiety/ depression.  As evidenced by recent BMI of 37.9. ? ?Intervention:  ?Instruction and discussion as noted above. ?Patient voices motivation to continue working on diet and lifestyle changes; lack of family support makes this more difficult and leads to some emotional eating. ?Established goals with input from patient, to help with food portions and hunger. ?Encouraged resuming contact with therapist, and discussion with PCP/ pharmacist regarding medication concerns. ? ? ? ?Learner/ who was taught:  ?Patient  ? ? ?Level of understanding: ?Verbalizes/ demonstrates competency ? ? ?Demonstrated degree of understanding via:   Teach back ?Learning barriers: ?None ? ?Willingness to learn/ readiness for change: ?Eager, change in progress ? ?Monitoring and Evaluation:  Dietary intake, exercise, BG control, and body weight ?     follow up:  07/19/21 at 11:30am   ?

## 2021-06-14 NOTE — Patient Instructions (Addendum)
When eating alone, drink low sugar juice or 4oz of regular juice before eating a meal to help control portions. ?Take several slow, deep breaths before eating to help relax. Eat slowly, take 20 minutes to finish a meal for hunger to resolve. ?Eat a protein food with each meal to help stabilize blood sugar and stay satisfied longer. Protein foods = lean meat/ poultry/ fish, low fat or 2% cheese, Greek yogurt, 2 Tablespoons peanut butter, 1/4 cup nuts, protein shake ?

## 2021-06-15 NOTE — Progress Notes (Signed)
Virtual Visit via Video Note  I connected with Lisa Gutierrez on 06/19/21 at  3:00 PM EST by a video enabled telemedicine application and verified that I am speaking with the correct person using two identifiers.  Location: Patient: home Provider: office Persons participated in the visit- patient, provider    I discussed the limitations of evaluation and management by telemedicine and the availability of in person appointments. The patient expressed understanding and agreed to proceed.     I discussed the assessment and treatment plan with the patient. The patient was provided an opportunity to ask questions and all were answered. The patient agreed with the plan and demonstrated an understanding of the instructions.   The patient was advised to call back or seek an in-person evaluation if the symptoms worsen or if the condition fails to improve as anticipated.  I provided 17 minutes of non-face-to-face time during this encounter.   [name], MD    [facility] MD/PA/NP OP Progress Note  06/19/2021 3:37 PM Lisa Gutierrez  MRN:  [id number]  Chief Complaint:  Chief Complaint  Patient presents with   Follow-up   Depression   HPI:  This is a follow-up appointment for depression and anxiety.  She states that she is having some flu for the past several days.  She has been doing the same otherwise.  She feels alone.  She talks about loss of her 2 dogs in the past.  She states that her husband has been mean.  Although she wants to have surgery for her right arm, there is nobody to help her.  She feels her broken, thinking about her animals she lost.  She states that she has not been doing well, stating that she had a COVID twice over the past few years.  She also has financial strain.  She had a panic attack when she went to lunch with her granddaughter.  She has occasional nightmares.  She feels down.  She denies change in appetite except that she feels nausea from [place];  she is planning to discuss this with her provider.  She denies SI.  She takes Xanax every day for anxiety.  She is not ingesting any medication which can potentially cause weight gain.  Although she wants to see Ms. Hussami for therapy, she states that she was advised to wait to have an appointment until she gets called by her; she is willing to have a follow-up visit.   Daily routine: takes care of her dogs, cats, house chores Exercise: unable to do due to knee pain Employment:  Support: none (talks with her oldest granddaughter, who has left to college) Household: husband Marital status: married for 35 years  Number of children: 2 sons.   Visit Diagnosis:    ICD-10-CM   1. MDD (major depressive disorder), recurrent episode, moderate (HCC)  F33.1     2. Anxiety  F41.9       Past Psychiatric History: Please see initial evaluation for full details. I have reviewed the history. No updates at this time.     Past Medical History:  Past Medical History:  Diagnosis Date   Anxiety    Depression    Depression    Phreesia 07/02/2020   Diabetes mellitus without complication (HCC)    GERD (gastroesophageal reflux disease)    Hypertension    IBS (irritable bowel syndrome)    Neuromuscular disorder [facility])     Past Surgical History:  Procedure Laterality Date   BREAST EXCISIONAL BIOPSY  CESAREAN SECTION N/A    Phreesia 07/02/2020   CHOLECYSTECTOMY     EXCISION / BIOPSY BREAST / NIPPLE / DUCT Right 1973   duct removed   SPINE SURGERY N/A    Phreesia 07/02/2020   TUBAL LIGATION N/A    Phreesia 07/02/2020    Family Psychiatric History: Please see initial evaluation for full details. I have reviewed the history. No updates at this time.     Family History:  Family History  Problem Relation Age of Onset   Diabetes Mother    Hypertension Mother    Coronary artery disease Father    Glaucoma Father    Breast cancer Neg Hx     Social History:  Social History   Socioeconomic  History   Marital status: Married    Spouse name: Not on file   Number of children: Not on file   Years of education: Not on file   Highest education level: Not on file  Occupational History   Not on file  Tobacco Use   Smoking status: Former   Smokeless tobacco: Never  Substance and Sexual Activity   Alcohol use: No    Alcohol/week: 0.0 standard drinks   Drug use: Never   Sexual activity: Not Currently    Partners: Male  Other Topics Concern   Not on file  Social History Narrative   Not on file   Social Determinants of Health   Financial Resource Strain: Not on file  Food Insecurity: Not on file  Transportation Needs: Not on file  Physical Activity: Not on file  Stress: Not on file  Social Connections: Not on file    Allergies:  Allergies  Allergen Reactions   Aspirin Other (See Comments)    GI upset   Ibuprofen Other (See Comments)    GERD   Levofloxacin Other (See Comments)    Weight gain   Meloxicam Other (See Comments)    GI upset   Morphine Other (See Comments)   Naprosyn  [Naproxen] Other (See Comments)    GI upset   Nsaids Other (See Comments)    GI upset   Quinolones    Robinul  [Glycopyrrolate] Nausea And Vomiting   Penicillin V Potassium Rash   Sulfa Antibiotics Rash    Metabolic Disorder Labs: Lab Results  Component Value Date   HGBA1C 7.8 (A) 01/03/2021   No results found for: PROLACTIN No results found for: CHOL, TRIG, HDL, CHOLHDL, VLDL, LDLCALC No results found for: TSH  Therapeutic Level Labs: No results found for: LITHIUM No results found for: VALPROATE No components found for:  CBMZ  Current Medications: Current Outpatient Medications  Medication Sig Dispense Refill   acetaminophen (TYLENOL) 500 MG tablet Take 2 tablets by mouth as needed.     acetic acid-hydrocortisone (VOSOL-HC) OTIC solution Place 4 drops into the right ear 2 (two) times daily. 10 mL 0   [START ON 06/24/2021] ALPRAZolam (XANAX) 0.5 MG tablet Take 1 tablet  (0.5 mg total) by mouth daily as needed for anxiety. 30 tablet 0   Bioflavonoid Products (VITAMIN C) CHEW Chew by mouth. With vitamin D and elderberry     busPIRone (BUSPAR) 5 MG tablet Take 1 tablet (5 mg total) by mouth 2 (two) times daily. 180 tablet 0   Calcium Carbonate (CALCIUM 500 PO) Take by mouth.     Flaxseed, Linseed, (FLAXSEED OIL PO) Take 1,500 mg by mouth 2 (two) times daily.     fluconazole (DIFLUCAN) 150 MG tablet Take 1 tablet  by mouth every 3 days. 5 tablet 1   FLUZONE HIGH-DOSE QUADRIVALENT 0.7 ML SUSY      glucose blood (ACCU-CHEK SMARTVIEW) test strip 1 each by Other route as needed for other. Use as instructed 200 each 11   JARDIANCE 10 MG TABS tablet TAKE 1 TABLET BY MOUTH DAILY BEFORE BREAKFAST. 30 tablet 1   LINZESS 145 MCG CAPS capsule TAKE 1 CAPSULE BY MOUTH EVERY DAY 30 capsule 2   meclizine (ANTIVERT) 25 MG tablet Take 1 tablet (25 mg total) by mouth 2 (two) times daily as needed for dizziness. 60 tablet 1   metoprolol succinate (TOPROL-XL) 25 MG 24 hr tablet Take 2 tablets (50 mg total) by mouth daily. 180 tablet 1   Multiple Vitamins-Minerals (MULTIVITAMIN ADULT PO) Take 1 tablet by mouth daily.     nystatin (MYCOSTATIN/NYSTOP) powder APPLY TO AFFECTED AREA TWICE A DAY 60 g 3   nystatin ointment (MYCOSTATIN) Apply 1 application topically 2 (two) times daily. 30 g 1   pantoprazole (PROTONIX) 40 MG tablet Take 1 tablet (40 mg total) by mouth 2 (two) times daily. 180 tablet 1   sitaGLIPtin-metformin (JANUMET) 50-1000 MG tablet Take 1 tablet by mouth 2 (two) times daily with a meal. 180 tablet 1   telmisartan-hydrochlorothiazide (MICARDIS HCT) 80-25 MG tablet TAKE 1 TABLET BY MOUTH EVERY DAY 90 tablet 1   venlafaxine XR (EFFEXOR-XR) 150 MG 24 hr capsule Take 1 capsule (150 mg total) by mouth daily with breakfast. 90 capsule 0   No current facility-administered medications for this visit.     Musculoskeletal: Strength & Muscle Tone:  N/A Gait & Station:   N/A Patient leans: N/A  Psychiatric Specialty Exam: Review of Systems  Psychiatric/Behavioral:  Positive for dysphoric mood and sleep disturbance. Negative for agitation, behavioral problems, confusion, decreased concentration, hallucinations, self-injury and suicidal ideas. The patient is nervous/anxious. The patient is not hyperactive.   All other systems reviewed and are negative.  There were no vitals taken for this visit.There is no height or weight on file to calculate BMI.  General Appearance: Fairly Groomed  Eye Contact:  Good  Speech:  Clear and Coherent  Volume:  Normal  Mood:  Anxious and Depressed  Affect:  Appropriate, Congruent, and calm  Thought Process:  Coherent  Orientation:  Full (Time, Place, and Person)  Thought Content: Logical   Suicidal Thoughts:  No  Homicidal Thoughts:  No  Memory:  Immediate;   Good  Judgement:  Good  Insight:  Good  Psychomotor Activity:  Normal  Concentration:  Concentration: Good and Attention Span: Good  Recall:  Good  Fund of Knowledge: Good  Language: Good  Akathisia:  No  Handed:  Right  AIMS (if indicated): not done  Assets:  Communication Skills Desire for Improvement  ADL's:  Intact  Cognition: WNL  Sleep:  Fair   Screenings: GAD-7    Flowsheet Row Video Visit from 05/03/2021 in [place] at [facility] Visit from 02/23/2021 in [place] at [facility] Visit from 12/09/2020 in [place] at [facility]  Total GAD-7 Score 7 6 17       [place] from 02/12/2020 in [facility] [place] from 02/06/2018 in [facility]  Total Score (max 30 points ) 29 30      PHQ2-9    Flowsheet Row Video Visit from 05/03/2021 in [place] at [facility]  [place] Visit from 02/23/2021 in [place] at [place] from 01/18/2021 in [place]  Office Visit from 12/09/2020 in [place] at [place] from 09/28/2020 in [place]  PHQ-2 Total Score 1 1 5 5 2   PHQ-9 Total Score 7 5 21 21  --      Flowsheet Row Counselor from 03/20/2021 in [place] Counselor from 02/10/2021 in [place] Counselor from 12/22/2020 in [place] Low Risk No Risk No Risk        Assessment and Plan:  Lisa Gutierrez is a 71 y.o. year old female with a history of depression, hypertension, diabetes, GERD, who presents for follow up appointment for below.    1. MDD (major depressive disorder), recurrent episode, moderate ([place]) 2. Anxiety R/o PTSD She continues to report depressive symptoms and anxiety in the context of marital conflict.  Other psychosocial stressors includes financial strain, procedure for her hand, knee pain , conflict with her son, and her daughter, who she used to have good relationship with.  Although it was recommended to adjust her medication, she is not interested in any medication which can potentially cause weight gain.  Will continue current medication regimen with the hope that seeing her therapist would alleviate some of the situational stress.  Will continue venlafaxine to target depression and anxiety.  Will continue BuSpar for anxiety.  Will continue Xanax as needed for anxiety.   This clinician has discussed the side effect associated with medication prescribed during this encounter. Please refer to notes in the previous encounters for more details.    Plan Continue venlafaxine 150 mg daily- monitor weight gain Continue BuSpar 5 mg twice a day- monitor diaphoresis, drowsiness Continue Xanax 0.5 mg daily as needed for anxiety Next appointment : 4/4 at 2:30 for 30 mins, video Will contact Ms. Hussami for continuation of care so that she can have follow up  appointment with her  Past trials of medication: sertraline, citalopram, lexapro, fluoxetine, bupropion (headache)   The patient demonstrates the following risk factors for suicide: Chronic risk factors for suicide include: psychiatric disorder of depression and chronic pain. Acute risk factors for suicide include: family or marital conflict. Protective factors for this patient include: hope for the future. Considering these factors, the overall suicide risk at this point appears to be low. Patient is appropriate for outpatient follow up.       Collaboration of Care: Collaboration of Care: Other contacts her therapist for continuity of care  Consent: Patient/Guardian gives verbal consent for treatment and assignment of benefits for services provided during this visit. Patient/Guardian expressed understanding and agreed to proceed.   Discussed with the patient that upcoming regulatory changes in Telehealth. The patient verbalized understanding that controlled substance will not be prescribed after the May 11th unless the patient is able to do in person or video visit.     [name], MD 06/19/2021, 3:37 PM

## 2021-06-16 ENCOUNTER — Ambulatory Visit: Payer: PPO

## 2021-06-19 ENCOUNTER — Other Ambulatory Visit: Payer: Self-pay

## 2021-06-19 ENCOUNTER — Encounter: Payer: Self-pay | Admitting: Psychiatry

## 2021-06-19 ENCOUNTER — Telehealth (INDEPENDENT_AMBULATORY_CARE_PROVIDER_SITE_OTHER): Payer: PPO | Admitting: Psychiatry

## 2021-06-19 DIAGNOSIS — F331 Major depressive disorder, recurrent, moderate: Secondary | ICD-10-CM

## 2021-06-19 DIAGNOSIS — F419 Anxiety disorder, unspecified: Secondary | ICD-10-CM

## 2021-06-19 MED ORDER — BUSPIRONE HCL 5 MG PO TABS: 5.0000 mg | ORAL_TABLET | Freq: Two times a day (BID) | ORAL | 0 refills | Status: DC

## 2021-06-19 MED ORDER — ALPRAZOLAM 0.5 MG PO TABS: 0.5000 mg | ORAL_TABLET | Freq: Every day | ORAL | 0 refills | Status: AC | PRN

## 2021-06-24 ENCOUNTER — Other Ambulatory Visit: Payer: Self-pay | Admitting: Nurse Practitioner

## 2021-06-24 ENCOUNTER — Other Ambulatory Visit: Payer: Self-pay | Admitting: Psychiatry

## 2021-06-24 DIAGNOSIS — E1165 Type 2 diabetes mellitus with hyperglycemia: Secondary | ICD-10-CM

## 2021-06-27 ENCOUNTER — Encounter: Payer: PPO | Admitting: Nurse Practitioner

## 2021-06-28 DIAGNOSIS — M654 Radial styloid tenosynovitis [de Quervain]: Secondary | ICD-10-CM | POA: Diagnosis not present

## 2021-07-03 ENCOUNTER — Telehealth: Payer: Self-pay

## 2021-07-03 NOTE — Telephone Encounter (Signed)
Discussed with the patient. She states that she has been having nightmares since being on BuSpar.  She agrees to hold night time dose to see if it mitigates this potential side effect.  ?- decrease buspar 5 mg daily

## 2021-07-03 NOTE — Telephone Encounter (Signed)
pt left message that she was returning your call.  

## 2021-07-03 NOTE — Telephone Encounter (Signed)
As in the previous mail, I discussed with the patient earlier today.

## 2021-07-03 NOTE — Telephone Encounter (Signed)
pt left a message that states she needed to speak with you about her medications. she wanted you to call her back.  ?

## 2021-07-03 NOTE — Telephone Encounter (Signed)
Left voice message to call back. Please ask what her concern is when she calls back. Thanks.

## 2021-07-03 NOTE — Telephone Encounter (Signed)
pt called she returning your call back ?

## 2021-07-09 ENCOUNTER — Other Ambulatory Visit: Payer: Self-pay | Admitting: Nurse Practitioner

## 2021-07-09 DIAGNOSIS — K219 Gastro-esophageal reflux disease without esophagitis: Secondary | ICD-10-CM

## 2021-07-10 DIAGNOSIS — M654 Radial styloid tenosynovitis [de Quervain]: Secondary | ICD-10-CM | POA: Diagnosis not present

## 2021-07-15 NOTE — Progress Notes (Deleted)
[facility] MD/PA/NP OP Progress Note ? ?07/15/2021 4:27 PM ?[name]  ?MRN:  [id number] ? ?Chief Complaint: No chief complaint on file. ? ?HPI:  ?- buspar was reduced due to reported nightmares since the last visit.  ? ?Visit Diagnosis: No diagnosis found. ? ?Past Psychiatric History: Please see initial evaluation for full details. I have reviewed the history. No updates at this time.  ?  ? ?Past Medical History:  ?Past Medical History:  ?Diagnosis Date  ? Anxiety   ? Depression   ? Depression   ? Phreesia 07/02/2020  ? Diabetes mellitus without complication ([place])   ? GERD (gastroesophageal reflux disease)   ? Hypertension   ? IBS (irritable bowel syndrome)   ? Neuromuscular disorder ([place])   ?  ?Past Surgical History:  ?Procedure Laterality Date  ? BREAST EXCISIONAL BIOPSY    ? CESAREAN SECTION N/A   ? Phreesia 07/02/2020  ? CHOLECYSTECTOMY    ? EXCISION / BIOPSY BREAST / NIPPLE / DUCT Right 1973  ? duct removed  ? SPINE SURGERY N/A   ? Phreesia 07/02/2020  ? TUBAL LIGATION N/A   ? Phreesia 07/02/2020  ? ? ?Family Psychiatric History: Please see initial evaluation for full details. I have reviewed the history. No updates at this time.  ?  ? ?Family History:  ?Family History  ?Problem Relation Age of Onset  ? Diabetes Mother   ? Hypertension Mother   ? Coronary artery disease Father   ? Glaucoma Father   ? Breast cancer Neg Hx   ? ? ?Social History:  ?Social History  ? ?Socioeconomic History  ? Marital status: Married  ?  Spouse name: Not on file  ? Number of children: Not on file  ? Years of education: Not on file  ? Highest education level: Not on file  ?Occupational History  ? Not on file  ?Tobacco Use  ? Smoking status: Former  ? Smokeless tobacco: Never  ?Substance and Sexual Activity  ? Alcohol use: No  ?  Alcohol/week: 0.0 standard drinks  ? Drug use: Never  ? Sexual activity: Not Currently  ?  Partners: Male  ?Other Topics Concern  ? Not on file  ?Social History Narrative  ? Not on file  ? ?Social  Determinants of Health  ? ?Financial Resource Strain: Not on file  ?Food Insecurity: Not on file  ?Transportation Needs: Not on file  ?Physical Activity: Not on file  ?Stress: Not on file  ?Social Connections: Not on file  ? ? ?Allergies:  ?Allergies  ?Allergen Reactions  ? Aspirin Other (See Comments)  ?  GI upset  ? Ibuprofen Other (See Comments)  ?  GERD  ? Levofloxacin Other (See Comments)  ?  Weight gain  ? Meloxicam Other (See Comments)  ?  GI upset  ? Morphine Other (See Comments)  ? Naprosyn  [Naproxen] Other (See Comments)  ?  GI upset  ? Nsaids Other (See Comments)  ?  GI upset  ? Quinolones   ? Robinul  [Glycopyrrolate] Nausea And Vomiting  ? Penicillin V Potassium Rash  ? Sulfa Antibiotics Rash  ? ? ?Metabolic Disorder Labs: ?Lab Results  ?Component Value Date  ? HGBA1C 7.8 (A) 01/03/2021  ? ?No results found for: PROLACTIN ?No results found for: CHOL, TRIG, HDL, CHOLHDL, VLDL, LDLCALC ?No results found for: TSH ? ?Therapeutic Level Labs: ?No results found for: LITHIUM ?No results found for: VALPROATE ?No components found for:  CBMZ ? ?Current Medications: ?Current Outpatient Medications  ?  Medication Sig Dispense Refill  ? acetaminophen (TYLENOL) 500 MG tablet Take 2 tablets by mouth as needed.    ? acetic acid-hydrocortisone (VOSOL-HC) OTIC solution Place 4 drops into the right ear 2 (two) times daily. 10 mL 0  ? ALPRAZolam (XANAX) 0.5 MG tablet Take 1 tablet (0.5 mg total) by mouth daily as needed for anxiety. 30 tablet 0  ? Bioflavonoid Products (VITAMIN C) CHEW Chew by mouth. With vitamin D and elderberry    ? busPIRone (BUSPAR) 5 MG tablet Take 1 tablet (5 mg total) by mouth 2 (two) times daily. 180 tablet 0  ? Calcium Carbonate (CALCIUM 500 PO) Take by mouth.    ? Flaxseed, Linseed, (FLAXSEED OIL PO) Take 1,500 mg by mouth 2 (two) times daily.    ? fluconazole (DIFLUCAN) 150 MG tablet Take 1 tablet by mouth every 3 days. 5 tablet 1  ? FLUZONE HIGH-DOSE QUADRIVALENT 0.7 ML SUSY     ? glucose blood  (ACCU-CHEK SMARTVIEW) test strip 1 each by Other route as needed for other. Use as instructed 200 each 11  ? JARDIANCE 10 MG TABS tablet TAKE 1 TABLET BY MOUTH EVERY DAY BEFORE BREAKFAST 30 tablet 1  ? LINZESS 145 MCG CAPS capsule TAKE 1 CAPSULE BY MOUTH EVERY DAY 30 capsule 2  ? meclizine (ANTIVERT) 25 MG tablet Take 1 tablet (25 mg total) by mouth 2 (two) times daily as needed for dizziness. 60 tablet 1  ? metoprolol succinate (TOPROL-XL) 25 MG 24 hr tablet Take 2 tablets (50 mg total) by mouth daily. 180 tablet 1  ? Multiple Vitamins-Minerals (MULTIVITAMIN ADULT PO) Take 1 tablet by mouth daily.    ? nystatin (MYCOSTATIN/NYSTOP) powder APPLY TO AFFECTED AREA TWICE A DAY 60 g 3  ? nystatin ointment (MYCOSTATIN) Apply 1 application topically 2 (two) times daily. 30 g 1  ? pantoprazole (PROTONIX) 40 MG tablet TAKE 1 TABLET BY MOUTH TWICE A DAY 180 tablet 1  ? sitaGLIPtin-metformin (JANUMET) 50-1000 MG tablet Take 1 tablet by mouth 2 (two) times daily with a meal. 180 tablet 1  ? telmisartan-hydrochlorothiazide (MICARDIS HCT) 80-25 MG tablet TAKE 1 TABLET BY MOUTH EVERY DAY 90 tablet 1  ? venlafaxine XR (EFFEXOR-XR) 150 MG 24 hr capsule Take 1 capsule (150 mg total) by mouth daily with breakfast. 90 capsule 0  ? ?No current facility-administered medications for this visit.  ? ? ? ?Musculoskeletal: ?Strength & Muscle Tone: within normal limits ?Gait & Station: normal ?Patient leans: N/A ? ?Psychiatric Specialty Exam: ?Review of Systems  ?There were no vitals taken for this visit.There is no height or weight on file to calculate BMI.  ?General Appearance: {Appearance:22683}  ?Eye Contact:  {BHH EYE CONTACT:22684}  ?Speech:  Clear and Coherent  ?Volume:  Normal  ?Mood:  {BHH MOOD:22306}  ?Affect:  {Affect (PAA):22687}  ?Thought Process:  Coherent  ?Orientation:  Full (Time, Place, and Person)  ?Thought Content: Logical   ?Suicidal Thoughts:  {ST/HT (PAA):22692}  ?Homicidal Thoughts:  {ST/HT (PAA):22692}  ?Memory:   Immediate;   Good  ?Judgement:  {Judgement (PAA):22694}  ?Insight:  {Insight (PAA):22695}  ?Psychomotor Activity:  Normal  ?Concentration:  Concentration: Good and Attention Span: Good  ?Recall:  Good  ?Fund of Knowledge: Good  ?Language: Good  ?Akathisia:  No  ?Handed:  Right  ?AIMS (if indicated): not done  ?Assets:  Communication Skills ?Desire for Improvement  ?ADL's:  Intact  ?Cognition: WNL  ?Sleep:  {B[facility] GOOD/FAIR/POOR:22877}  ? ?Screenings: ?GAD-7   ? ?Flowsheet Row  Video Visit from 05/03/2021 in [place] at [facility] Visit from 02/23/2021 in [place] at [facility] Visit from 12/09/2020 in [place] at [facility]  ?Total GAD-7 Score '7 6 17  '$ ? ?  ? ?Mini-Mental   ? ?Flowsheet Row Clinical Support from 02/12/2020 in [facility] [place] from 02/06/2018 in [facility] [place]  ?Total Score (max 30 points ) 29 30  ? ?  ? ?PHQ2-9   ? ?Flowsheet Row Video Visit from 05/03/2021 in [place] at [facility] Visit from 02/23/2021 in [place] at [place] from 01/18/2021 in [place] Office Visit from 12/09/2020 in [place] at [place] from 09/28/2020 in [place]  ?PHQ-2 Total Score '1 1 5 5 2  '$ ?PHQ-9 Total Score '7 5 21 21 '$ --  ? ?  ? ?Flowsheet Row Counselor from 03/20/2021 in [place] from 02/10/2021 in [place] from 12/22/2020 in [place]  ?C-SSRS RISK CATEGORY Low Risk No Risk No Risk  ? ?  ? ? ? ?Assessment and Plan:  ?Temitope Flammer is a 71 y.o. year old female with a history of depression, hypertension, diabetes, GERD, who presents for follow up appointment for below.  ? ?1. MDD (major depressive disorder), recurrent episode, moderate ([place]) ?2. Anxiety ?R/o PTSD ?She  continues to report depressive symptoms and anxiety in the context of marital conflict.  Other psychosocial stressors includes financial strain, procedure for her hand, knee pain , conflict with her son, and her daugh

## 2021-07-17 ENCOUNTER — Encounter: Payer: Self-pay | Admitting: Nurse Practitioner

## 2021-07-18 ENCOUNTER — Ambulatory Visit: Payer: PPO | Admitting: Psychiatry

## 2021-07-18 ENCOUNTER — Encounter: Payer: PPO | Admitting: Nurse Practitioner

## 2021-07-19 ENCOUNTER — Encounter: Payer: PPO | Attending: Dietician | Admitting: Dietician

## 2021-07-19 ENCOUNTER — Ambulatory Visit: Payer: PPO | Admitting: Dietician

## 2021-07-19 ENCOUNTER — Telehealth (INDEPENDENT_AMBULATORY_CARE_PROVIDER_SITE_OTHER): Payer: PPO | Admitting: Nurse Practitioner

## 2021-07-19 ENCOUNTER — Encounter: Payer: Self-pay | Admitting: Nurse Practitioner

## 2021-07-19 VITALS — Ht 66.0 in | Wt 234.0 lb

## 2021-07-19 DIAGNOSIS — Z6837 Body mass index (BMI) 37.0-37.9, adult: Secondary | ICD-10-CM | POA: Diagnosis not present

## 2021-07-19 DIAGNOSIS — F411 Generalized anxiety disorder: Secondary | ICD-10-CM | POA: Diagnosis not present

## 2021-07-19 DIAGNOSIS — E1165 Type 2 diabetes mellitus with hyperglycemia: Secondary | ICD-10-CM | POA: Insufficient documentation

## 2021-07-19 DIAGNOSIS — B379 Candidiasis, unspecified: Secondary | ICD-10-CM | POA: Diagnosis not present

## 2021-07-19 DIAGNOSIS — F3341 Major depressive disorder, recurrent, in partial remission: Secondary | ICD-10-CM

## 2021-07-19 DIAGNOSIS — E669 Obesity, unspecified: Secondary | ICD-10-CM | POA: Insufficient documentation

## 2021-07-19 DIAGNOSIS — Z713 Dietary counseling and surveillance: Secondary | ICD-10-CM | POA: Insufficient documentation

## 2021-07-19 NOTE — Progress Notes (Signed)
Virtual Visit via Telephone Note ? ?I connected with Lisa Gutierrez on 07/30/21 at  1:50 PM EDT by telephone and verified that I am speaking with the correct person using two identifiers. ? ?Location: ?Patient: home  ?Provider: [place] [facility] at [facility]   ?  ?I discussed the limitations, risks, security and privacy concerns of performing an evaluation and management service by telephone and the availability of in person appointments. I also discussed with the patient that there may be a patient responsible charge related to this service. The patient expressed understanding and agreed to proceed. ? ? ?History of Present Illness: ?The patient presents for a follow-up visit.  States she recently had to see a new psychiatrist as her psychiatrist left the practice.  She states she is having trouble understanding new provider.  She is not happy with the switch to the new psychiatrist.  She really does not want to keep going with psychiatry however, she does have a myriad of psychiatric concerns which extended past primary care scope of practice.  She does plan to continue with her therapist. ?States she is having with Jardiance.  Causing joint pain. ?Feels like she has a yeast infection all the time. ?  ?Observations/Objective: ? ?The patient is alert and oriented. She is pleasant and answers all questions appropriately. Breathing is non-labored. She is in no acute distress at this time.  She sounds worried and stressed.  She is tearful at times during the visit.  She denies feeling suicidal.  She does not want hurt herself or anyone else.  States that she does feel hopeless and helpless at times. ? ?Today's Vitals  ? 07/19/21 1305  ?Weight: 234 lb (106.1 kg)  ?Height: '5\' 6"'  (1.676 m)  ? ?Body mass index is 37.77 kg/m?.  ? ?Assessment and Plan: ?1. GAD (generalized anxiety disorder) ?Patient with severe anxiety and depression.  Unhappy with the change of psychiatrist after provider she had been seen  left the practice.  We will do an urgent referral to a psychiatrist for further evaluation and treatment. ?- Ambulatory referral to Psychiatry ? ?2. MDD (major depressive disorder), recurrent, in partial remission ([place]) ?Patient with severe anxiety and depression.  Unhappy with the change of psychiatrist after provider she had been seen left the practice.  We will do an urgent referral to a psychiatrist for further evaluation and treatment. ?- Ambulatory referral to Psychiatry ? ?3. Type 2 diabetes mellitus with hyperglycemia, without long-term current use of insulin ([place]) ?For now, we will stop Jardiance.  Continue oral diabetic medication as prescribed.  We will check hemoglobin A1c at next, in office visit.  This is scheduled for later this month.  We will adjust medications at that time. ? ?4. Yeast infection ?May take Diflucan as previously prescribed and as needed. ? ?Follow Up Instructions: ? ?  ?I discussed the assessment and treatment plan with the patient. The patient was provided an opportunity to ask questions and all were answered. The patient agreed with the plan and demonstrated an understanding of the instructions. ?  ?The patient was advised to call back or seek an in-person evaluation if the symptoms worsen or if the condition fails to improve as anticipated. ? ?I provided 20 minutes of non-face-to-face time during this encounter. ? ? ?Ronnell Freshwater, NP  ?

## 2021-07-19 NOTE — Progress Notes (Signed)
Medical Nutrition Therapy: Visit start time: 1497  end time: 0263  ?Assessment:  Diagnosis: Type 2 Diabetes, obesity ?Medical history changes: recent hand surgery, in recovery ?Psychosocial issues/ stress concerns: high stress level, feelings of isolation, low self-esteem, depression, anxiety ? ?Current weight: not measured, virtual visit. Most recent weight measurement: 248lbs, BMI 40, on 05/15/21 ? ? ? ?Progress and evaluation:  ?Patient reports having difficulty adhering to healthy dietary habits for several reasons, including lack of family support, stress eating or eating out of spite.  ?She does not have much appetite for most meats, often eats fish.  ?Reports feeling hungry all the time. ?She has been unable to engage in exercise recently due to hand surgery and recovery. She reports low energy level since having COVID (2-3 times in past 2 years).  ?She has been having some vertigo, making her more fearful of venturing out and going places. ?  ?Physical activity: none at this time ? ?Dietary Intake:  ?Usual eating pattern includes 3 meals and 2-3 snacks per day. ?Dining out frequency: 1-2 meals per week. ? ?Breakfast: blueberry bagel with butter or cream cheese ?Snack: skinny pop popcorn or goldfish crackers ?Lunch: sandwich, occasionally salad (GI upset when eating salads too often) ?Snack: none or same as am ?Supper: likes vegetables ie sugar snap peas, green beans, squash, others, often with grilled salmon/ chicken ?Snack: sugar free jello ?Beverages: water, some sweet tea homemade with small amount of sugar ? ?Intervention:  ? ?Nutrition Care Education: ? ?   ?Weight control: reviewed progress since previous visit; discussed managing hunger and food cravings and role of stress on appetite and weight control; reviewed portion control strategies ?Diabetes:  appropriate carb intake and balance; healthy carb choices for lower glycemic response; role of fiber in controlling blood sugar and hunger, importance of  protein sources at regular intervals during the day ? ? ? ?Nutritional Diagnosis:  [place]-2.2 Altered nutrition-related laboratory As related to diabetes type 2.  As evidenced by recent HbA1C of 7.8%. ?-3.3 Overweight/obesity As related to excess calories, inadequate physical activity, stress/ anxiety/ depression.  As evidenced by recent BMI of 40. ? ? ?Product/process development scientist given (by email):  ?Tips for Managing Food Cravings ?Resources for meal ideas and cooking ? ? ? ?Learner/ who was taught:  ?Patient  ? ? ?Level of understanding: ?Verbalizes/ demonstrates competency ? ? ?Demonstrated degree of understanding via:   Teach back ?Learning barriers: ?None ? ?Willingness to learn/ readiness for change: ?Eager, change in progress ? ? ?Monitoring and Evaluation:  Dietary intake, exercise, BG control, and body weight ?     follow up:  to be scheduled for 09/2021   ?

## 2021-07-23 ENCOUNTER — Other Ambulatory Visit: Payer: Self-pay | Admitting: Psychiatry

## 2021-07-24 ENCOUNTER — Ambulatory Visit (INDEPENDENT_AMBULATORY_CARE_PROVIDER_SITE_OTHER): Payer: PPO | Admitting: Licensed Clinical Social Worker

## 2021-07-24 ENCOUNTER — Ambulatory Visit: Payer: PPO

## 2021-07-24 ENCOUNTER — Encounter: Payer: Self-pay | Admitting: Licensed Clinical Social Worker

## 2021-07-24 DIAGNOSIS — F419 Anxiety disorder, unspecified: Secondary | ICD-10-CM | POA: Diagnosis not present

## 2021-07-24 DIAGNOSIS — F331 Major depressive disorder, recurrent, moderate: Secondary | ICD-10-CM | POA: Diagnosis not present

## 2021-07-24 NOTE — Progress Notes (Signed)
Virtual Visit via Video Note ? ?I connected with Lisa Gutierrez on 07/24/21 at  3:00 PM EDT by a video enabled telemedicine application and verified that I am speaking with the correct person using two identifiers. ? ?Location: ?Patient: home ?Provider: remote office [place], [place]) ?  ?I discussed the limitations of evaluation and management by telemedicine and the availability of in person appointments. The patient expressed understanding and agreed to proceed. ?  ?I discussed the assessment and treatment plan with the patient. The patient was provided an opportunity to ask questions and all were answered. The patient agreed with the plan and demonstrated an understanding of the instructions. ?  ?The patient was advised to call back or seek an in-person evaluation if the symptoms worsen or if the condition fails to improve as anticipated. ? ?I provided 45 minutes of non-face-to-face time during this encounter. ? ? ?Lisa Mccuen R Paige Monarrez, LCSW ? ? ?THERAPIST PROGRESS NOTE ? ?Session Time: 3:15-4p ? ?Participation Level: Active ? ?Behavioral Response: NAAlertDepressed and Dysphoric ? ?Type of Therapy: Individual Therapy ? ?Treatment Goals addressed: Problem: Decrease depressive symptoms and improve levels of effective functioning ?Goal: LTG: Reduce frequency, intensity, and duration of depression symptoms as evidenced by: SSB input needed on appropriate metric ?Outcome: Not Progressing ?Note: Pt reports continuing tearfulness, sad affect, hopelessness, low motivation, low initiative. Dysphoric mood.  ?Goal: STG: '@PREFFIRSTNAME'$ @ will participate in at least 80% of scheduled individual psychotherapy sessions ?Outcome: Progressing ?Note: Pt on time and engaged throughout session ? ?Intervention: Encourage verbalization of feelings/concerns/expectations ?Intervention: Provide emotional support ?Intervention: Encourage patient to identify triggers ?Intervention: Manage signs of labile or escalating  emotions ? ?ProgressTowards Goals: Not Progressing ? ?Interventions: CBT and Supportive (see above) ? ?Summary: Lisa Gutierrez is a 71 y.o. female who presents with continuing symptoms related to depression. Pt reports she is experiencing tearfulness, worrying, flashbacks/intrusive thoughts, isolation/withdrawal, and an overall sense of "doom and gloom".  ? ?Allowed pt to explore and express thoughts and feelings associated with recent life situations and external stressors. Allowed pt to identify areas in life that are triggering these feelings--primarily husband that continues to be overly critical of [name] and expressed disapproval in everything that she does. "I don't know what I've done wrong". ? ?Pt states that she feels a lot of people are mad at her. "My doctors just think im a druggie".  Allowed pt to clarify statement, and pt starts talking about xanax and doctors encouraging her not to take it. Reminded pt that we have discussed more than once at an entire session about benzos and the harm that long term use could cause re: dementia and/or memory loss. Helped pt to recognize that doctor recommendations are nothing personal against her--they are only recommendations suggested to improve her overall health. Discussed at length. Discussed being more assertive with communication skills. Pt admits she is often a people pleaser and is worries about whether "they will get mad at me".  ? ?Discussed wellness/self care. Pt admits that physical activity is very hard for her to do.  ? ?Continued recommendations are as follows: self care behaviors, positive social engagements, focusing on overall work/home/life balance, and focusing on positive physical and emotional wellness.  ? ?Suicidal/Homicidal: Yes--pt states that she would never harm herself because of her grandchildren but has a sense of hopelessness about the future. ? ?Therapist Response: Pt is continuing to apply interventions learned in session  into daily life situations. Pt is currently on track to meet goals utilizing interventions mentioned above.  Personal growth and progress noted. Treatment to continue as indicated.  ? ?Plan: Return again in 4 weeks. ? ?Diagnosis: MDD (major depressive disorder), recurrent episode, moderate ([place]) ? ?Anxiety ? ?Collaboration of Care: Other pt encouraged to continue with psychiatrist of record, Dr. Modesta Messing ? ?Patient/Guardian was advised Release of Information must be obtained prior to any record release in order to collaborate their care with an outside provider. Patient/Guardian was advised if they have not already done so to contact the registration department to sign all necessary forms in order for [place] to release information regarding their care.  ? ?Consent: Patient/Guardian gives verbal consent for treatment and assignment of benefits for services provided during this visit. Patient/Guardian expressed understanding and agreed to proceed.  ? ?Camellia Popescu R Anureet Bruington, LCSW ?07/24/2021 ? ?

## 2021-07-25 DIAGNOSIS — Z9889 Other specified postprocedural states: Secondary | ICD-10-CM | POA: Diagnosis not present

## 2021-07-25 NOTE — Plan of Care (Signed)
?  Problem: Decrease depressive symptoms and improve levels of effective functioning ?Goal: LTG: Reduce frequency, intensity, and duration of depression symptoms as evidenced by: SSB input needed on appropriate metric ?Outcome: Not Progressing ?Note: Pt reports continuing tearfulness, sad affect, hopelessness, low motivation, low initiative. Dysphoric mood.  ?Goal: STG: '@PREFFIRSTNAME'$ @ will participate in at least 80% of scheduled individual psychotherapy sessions ?Outcome: Progressing ?Note: Pt on time and engaged throughout session ?Intervention: Encourage verbalization of feelings/concerns/expectations ?Intervention: Provide emotional support ?Intervention: Encourage patient to identify triggers ?Intervention: Manage signs of labile or escalating emotions ?  ?

## 2021-07-26 ENCOUNTER — Telehealth: Payer: Self-pay | Admitting: Nurse Practitioner

## 2021-07-26 NOTE — Progress Notes (Deleted)
BH MD/PA/NP OP Progress Note ? ?07/26/2021 11:03 AM ?Lisa Gutierrez  ?MRN:  [id number] ? ?Chief Complaint: No chief complaint on file. ? ?HPI:  ? - decrased buspar 5 mg daily  ? ?Visit Diagnosis: No diagnosis found. ? ?Past Psychiatric History: Please see initial evaluation for full details. I have reviewed the history. No updates at this time.  ?  ? ?Past Medical History:  ?Past Medical History:  ?Diagnosis Date  ? Anxiety   ? Depression   ? Depression   ? Phreesia 07/02/2020  ? Diabetes mellitus without complication ([place])   ? GERD (gastroesophageal reflux disease)   ? Hypertension   ? IBS (irritable bowel syndrome)   ? Neuromuscular disorder ([place])   ?  ?Past Surgical History:  ?Procedure Laterality Date  ? BREAST EXCISIONAL BIOPSY    ? CESAREAN SECTION N/A   ? [employer]reesia 07/02/2020  ? CHOLECYSTECTOMY    ? EXCISION / BIOPSY BREAST / NIPPLE / DUCT Right 1973  ? duct removed  ? SPINE SURGERY N/A   ? [employer]reesia 07/02/2020  ? TUBAL LIGATION N/A   ? [employer]reesia 07/02/2020  ? ? ?Family Psychiatric History: Please see initial evaluation for full details. I have reviewed the history. No updates at this time.  ?  ? ?Family History:  ?Family History  ?Problem Relation Age of Onset  ? Diabetes Mother   ? Hypertension Mother   ? Coronary artery disease Father   ? Glaucoma Father   ? Breast cancer Neg Hx   ? ? ?Social History:  ?Social History  ? ?Socioeconomic History  ? Marital status: Married  ?  Spouse name: Not on file  ? Number of children: Not on file  ? Years of education: Not on file  ? Highest education level: Not on file  ?Occupational History  ? Not on file  ?Tobacco Use  ? Smoking status: Former  ? Smokeless tobacco: Never  ?Substance and Sexual Activity  ? Alcohol use: No  ?  Alcohol/week: 0.0 standard drinks  ? Drug use: Never  ? Sexual activity: Not Currently  ?  Partners: Male  ?Other Topics Concern  ? Not on file  ?Social History Narrative  ? Not on file  ? ?Social Determinants of Health  ? ?Financial Resource  Strain: Not on file  ?Food Insecurity: Not on file  ?Transportation Needs: Not on file  ?Physical Activity: Not on file  ?Stress: Not on file  ?Social Connections: Not on file  ? ? ?Allergies:  ?Allergies  ?Allergen Reactions  ? Aspirin Other (See Comments)  ?  GI upset  ? Ibuprofen Other (See Comments)  ?  GERD  ? Levofloxacin Other (See Comments)  ?  Weight gain  ? Meloxicam Other (See Comments)  ?  GI upset  ? Morphine Other (See Comments)  ? Naprosyn  [Naproxen] Other (See Comments)  ?  GI upset  ? Nsaids Other (See Comments)  ?  GI upset  ? Quinolones   ? Robinul  [Glycopyrrolate] Nausea And Vomiting  ? Penicillin V Potassium Rash  ? Sulfa Antibiotics Rash  ? ? ?Metabolic Disorder Labs: ?Lab Results  ?Component Value Date  ? HGBA1C 7.8 (A) 01/03/2021  ? ?No results found for: PROLACTIN ?No results found for: CHOL, TRIG, HDL, CHOLHDL, VLDL, LDLCALC ?No results found for: TSH ? ?Therapeutic Level Labs: ?No results found for: LITHIUM ?No results found for: VALPROATE ?No components found for:  CBMZ ? ?Current Medications: ?Current Outpatient Medications  ?Medication Sig Dispense Refill  ?  acetaminophen (TYLENOL) 500 MG tablet Take 2 tablets by mouth as needed.    ? acetic acid-hydrocortisone (VOSOL-HC) OTIC solution Place 4 drops into the right ear 2 (two) times daily. 10 mL 0  ? Bioflavonoid Products (VITAMIN C) CHEW Chew by mouth. With vitamin D and elderberry    ? busPIRone (BUSPAR) 5 MG tablet Take 1 tablet (5 mg total) by mouth 2 (two) times daily. 180 tablet 0  ? Calcium Carbonate (CALCIUM 500 PO) Take by mouth.    ? Flaxseed, Linseed, (FLAXSEED OIL PO) Take 1,500 mg by mouth 2 (two) times daily.    ? fluconazole (DIFLUCAN) 150 MG tablet Take 1 tablet by mouth every 3 days. 5 tablet 1  ? FLUZONE HIGH-DOSE QUADRIVALENT 0.7 ML SUSY     ? glucose blood (ACCU-CHEK SMARTVIEW) test strip 1 each by Other route as needed for other. Use as instructed 200 each 11  ? JARDIANCE 10 MG TABS tablet TAKE 1 TABLET BY MOUTH  EVERY DAY BEFORE BREAKFAST 30 tablet 1  ? LINZESS 145 MCG CAPS capsule TAKE 1 CAPSULE BY MOUTH EVERY DAY 30 capsule 2  ? meclizine (ANTIVERT) 25 MG tablet Take 1 tablet (25 mg total) by mouth 2 (two) times daily as needed for dizziness. 60 tablet 1  ? metoprolol succinate (TOPROL-XL) 25 MG 24 hr tablet Take 2 tablets (50 mg total) by mouth daily. 180 tablet 1  ? Multiple Vitamins-Minerals (MULTIVITAMIN ADULT PO) Take 1 tablet by mouth daily.    ? nystatin (MYCOSTATIN/NYSTOP) powder APPLY TO AFFECTED AREA TWICE A DAY 60 g 3  ? nystatin ointment (MYCOSTATIN) Apply 1 application topically 2 (two) times daily. 30 g 1  ? pantoprazole (PROTONIX) 40 MG tablet TAKE 1 TABLET BY MOUTH TWICE A DAY 180 tablet 1  ? sitaGLIPtin-metformin (JANUMET) 50-1000 MG tablet Take 1 tablet by mouth 2 (two) times daily with a meal. 180 tablet 1  ? telmisartan-hydrochlorothiazide (MICARDIS HCT) 80-25 MG tablet TAKE 1 TABLET BY MOUTH EVERY DAY 90 tablet 1  ? venlafaxine XR (EFFEXOR-XR) 150 MG 24 hr capsule TAKE 1 CAPSULE BY MOUTH DAILY WITH BREAKFAST. 90 capsule 0  ? ?No current facility-administered medications for this visit.  ? ? ? ?Musculoskeletal: ?Strength & Muscle Tone:  N/A ?Gait & Station:  N/A ?Patient leans: N/A ? ?Psychiatric Specialty Exam: ?Review of Systems  ?There were no vitals taken for this visit.There is no height or weight on file to calculate BMI.  ?General Appearance: {Appearance:22683}  ?Eye Contact:  {BHH EYE CONTACT:22684}  ?Speech:  Clear and Coherent  ?Volume:  Normal  ?Mood:  {BHH MOOD:22306}  ?Affect:  {Affect (PAA):22687}  ?Thought Process:  Coherent  ?Orientation:  Full (Time, Place, and Person)  ?Thought Content: Logical   ?Suicidal Thoughts:  {ST/HT (PAA):22692}  ?Homicidal Thoughts:  {ST/HT (PAA):22692}  ?Memory:  Immediate;   Good  ?Judgement:  {Judgement (PAA):22694}  ?Insight:  {Insight (PAA):22695}  ?Psychomotor Activity:  Normal  ?Concentration:  Concentration: Good and Attention Span: Good  ?Recall:   Good  ?Fund of Knowledge: Good  ?Language: Good  ?Akathisia:  No  ?Handed:  Right  ?AIMS (if indicated): not done  ?Assets:  Communication Skills ?Desire for Improvement  ?ADL's:  Intact  ?Cognition: WNL  ?Sleep:  {BHH GOOD/FAIR/POOR:22877}  ? ?Screenings: ?GAD-7   ? ?Flowsheet Row Video Visit from 07/19/2021 in [place] at [facility] Video Visit from 05/03/2021 in [place] at [facility] Visit from 02/23/2021 in [facility] at [facility] Visit from 12/09/2020 in [place] at [facility]  ?Total GAD-7 Score '15 7 6 17  '$ ? ?  ? ?Mini-Mental   ? ?Flowsheet Row Clinical Support from 02/12/2020 in [facility] [place] from 02/06/2018 in [facility], [place]  ?Total Score (max 30 points ) 29 30  ? ?  ? ?PHQ2-9   ? ?Flowsheet Row Video Visit from 07/19/2021 in [place] at [facility] Video Visit from 05/03/2021 in [place] at [facility] Visit from 02/23/2021 in [place] at [place] from 01/18/2021 in [place] Office Visit from 12/09/2020 in [place] at [facility]  ?PHQ-2 Total Score '1 1 1 5 5  '$ ?PHQ-9 Total Score '7 7 5 21 21  '$ ? ?  ? ?Flowsheet Row Counselor from 07/24/2021 in [place] from 03/20/2021 in [place] from 02/10/2021 in [place]  ?C-SSRS RISK CATEGORY Low Risk Low Risk No Risk  ? ?  ? ? ? ?Assessment and Plan:  ?Lisa Gutierrez is a 71 y.o. year old female with a history of depression, hypertension, diabetes, GERD, who presents for follow up appointment for below.  ? ? ?1. MDD (major depressive disorder), recurrent episode, moderate ([place]) ?2. Anxiety ?R/o PTSD ?She continues to report depressive symptoms and anxiety in the context of marital conflict.  Other psychosocial stressors  includes financial strain, procedure for her hand, knee pain , conflict with her son, and her daughter, who she used to have good relationship with.  Although it was recommended to adjust her medication, she is

## 2021-07-26 NOTE — Telephone Encounter (Signed)
Called pt she is advised of her Rx and when to start to taking it ?

## 2021-07-26 NOTE — Telephone Encounter (Signed)
Patient called and stated she had gotten her Janumet and the strength was 50-1000 however she has been taking 50-500 and wants to know if she needs to finish the 50-500 or just go ahead with the 50-1000? ?

## 2021-07-27 ENCOUNTER — Encounter: Payer: Self-pay | Admitting: Nurse Practitioner

## 2021-07-28 ENCOUNTER — Telehealth: Payer: PPO | Admitting: Psychiatry

## 2021-07-31 ENCOUNTER — Encounter: Payer: Self-pay | Admitting: Nurse Practitioner

## 2021-08-01 ENCOUNTER — Encounter: Payer: Self-pay | Admitting: Nurse Practitioner

## 2021-08-02 ENCOUNTER — Other Ambulatory Visit: Payer: Self-pay | Admitting: Nurse Practitioner

## 2021-08-02 DIAGNOSIS — E1165 Type 2 diabetes mellitus with hyperglycemia: Secondary | ICD-10-CM

## 2021-08-02 MED ORDER — JANUMET 50-500 MG PO TABS: 1.0000 | ORAL_TABLET | Freq: Two times a day (BID) | ORAL | 3 refills | Status: DC

## 2021-08-08 ENCOUNTER — Encounter: Payer: PPO | Admitting: Nurse Practitioner

## 2021-08-08 ENCOUNTER — Other Ambulatory Visit: Payer: Self-pay | Admitting: Nurse Practitioner

## 2021-08-08 DIAGNOSIS — K581 Irritable bowel syndrome with constipation: Secondary | ICD-10-CM

## 2021-08-10 ENCOUNTER — Telehealth: Payer: Self-pay | Admitting: Nurse Practitioner

## 2021-08-10 NOTE — Telephone Encounter (Signed)
Patient is asking was the company that sends her the [place] going to send her the right dosage? Are you aware of anything? Also is she supposed to take the dose that they sent her? [phone] ?

## 2021-08-11 ENCOUNTER — Other Ambulatory Visit: Payer: Self-pay | Admitting: Nurse Practitioner

## 2021-08-11 DIAGNOSIS — E1165 Type 2 diabetes mellitus with hyperglycemia: Secondary | ICD-10-CM

## 2021-08-11 MED ORDER — JANUMET 50-500 MG PO TABS: 1.0000 | ORAL_TABLET | Freq: Two times a day (BID) | ORAL | 3 refills | Status: DC

## 2021-08-13 NOTE — Telephone Encounter (Signed)
I printed out a new prescription with the new dose. We need to fax to [employer] and make sure they receive it. Until she gets this or hears something from the company, she should take the dose they sent her, like we talked about with her earlier visit.

## 2021-08-15 NOTE — Telephone Encounter (Signed)
Called pt she is advised of her recommendation that was discuss during her last visit ?

## 2021-08-17 ENCOUNTER — Encounter: Payer: PPO | Admitting: Nurse Practitioner

## 2021-08-23 ENCOUNTER — Other Ambulatory Visit: Payer: Self-pay | Admitting: Nurse Practitioner

## 2021-08-23 DIAGNOSIS — I10 Essential (primary) hypertension: Secondary | ICD-10-CM

## 2021-08-24 ENCOUNTER — Other Ambulatory Visit: Payer: Self-pay | Admitting: Psychiatry

## 2021-08-24 ENCOUNTER — Encounter: Payer: Self-pay | Admitting: Nurse Practitioner

## 2021-08-24 ENCOUNTER — Ambulatory Visit (INDEPENDENT_AMBULATORY_CARE_PROVIDER_SITE_OTHER): Payer: PPO | Admitting: Nurse Practitioner

## 2021-08-24 VITALS — BP 133/82 | HR 100 | Temp 98.0°F | Ht 66.14 in | Wt 245.1 lb

## 2021-08-24 DIAGNOSIS — R5383 Other fatigue: Secondary | ICD-10-CM

## 2021-08-24 DIAGNOSIS — F3341 Major depressive disorder, recurrent, in partial remission: Secondary | ICD-10-CM

## 2021-08-24 DIAGNOSIS — B379 Candidiasis, unspecified: Secondary | ICD-10-CM

## 2021-08-24 DIAGNOSIS — F411 Generalized anxiety disorder: Secondary | ICD-10-CM

## 2021-08-24 DIAGNOSIS — E1165 Type 2 diabetes mellitus with hyperglycemia: Secondary | ICD-10-CM | POA: Diagnosis not present

## 2021-08-24 DIAGNOSIS — Z0000 Encounter for general adult medical examination without abnormal findings: Secondary | ICD-10-CM | POA: Diagnosis not present

## 2021-08-24 DIAGNOSIS — Z8616 Personal history of COVID-19: Secondary | ICD-10-CM

## 2021-08-24 DIAGNOSIS — I10 Essential (primary) hypertension: Secondary | ICD-10-CM | POA: Diagnosis not present

## 2021-08-24 LAB — POCT UA - MICROALBUMIN: Microalbumin Ur, POC: 10 mg/L

## 2021-08-24 NOTE — Progress Notes (Signed)
? ?Subjective:  ? Lisa Gutierrez is a 71 y.o. female who presents for Medicare Annual (Subsequent) preventive examination. ?-type 2 diabetes - currently taking Janumet XR 50/1000, 1/2 tablet daily. Taking her jardiance '10mg'$  every other day. I have changed the dosing to Janumet 50/'500mg'$  twice daily. A new prescription for this was sent to [employer] for patient assistance program.  ?-sees dermatology for blistering of the skin. This is due to stress. Was given prescription for clobetasol which she uses as needed.  ?-sees psychiatry and counseling services.  ? ?Review of Systems    ?Review of Systems  ?Constitutional:  Negative for chills, fever and malaise/fatigue.  ?HENT:  Negative for congestion, sinus pain and sore throat.   ?Eyes: Negative.   ?     Decrease in visual activity. Has appointment in August for diabetic eye exam   ?Respiratory:  Negative for cough, shortness of breath and wheezing.   ?Cardiovascular:  Negative for chest pain, palpitations and leg swelling.  ?Gastrointestinal:  Negative for constipation, diarrhea, nausea and vomiting.  ?Genitourinary: Negative.   ?Musculoskeletal:  Negative for myalgias.  ?Skin: Negative.   ?Neurological:  Negative for dizziness and headaches.  ?Endo/Heme/Allergies:  Does not bruise/bleed easily.  ?Psychiatric/Behavioral:  Negative for depression. The patient is not nervous/anxious.    ?  ? ?   ?Objective:  ?  ?Today's Vitals  ? 08/24/21 1038  ?BP: 133/82  ?Pulse: 100  ?Temp: 98 ?F (36.7 ?C)  ?SpO2: 98%  ?Weight: 245 lb 1.9 oz (111.2 kg)  ?Height: 5' 6.14" (1.68 m)  ? ?Body mass index is 39.39 kg/m?. ? ?Physical Exam ?Vitals and nursing note reviewed.  ?Constitutional:   ?   Appearance: Normal appearance. She is well-developed. She is obese.  ?HENT:  ?   Head: Normocephalic and atraumatic.  ?   Right Ear: Tympanic membrane, ear canal and external ear normal.  ?   Left Ear: Tympanic membrane, ear canal and external ear normal.  ?   Nose: Nose normal.  ?    Mouth/Throat:  ?   Mouth: Mucous membranes are moist.  ?   Pharynx: Oropharynx is clear.  ?Eyes:  ?   Extraocular Movements: Extraocular movements intact.  ?   Conjunctiva/sclera: Conjunctivae normal.  ?   Pupils: Pupils are equal, round, and reactive to light.  ?Neck:  ?   Vascular: No carotid bruit.  ?Cardiovascular:  ?   Rate and Rhythm: Normal rate and regular rhythm.  ?   Pulses:     ?     Dorsalis pedis pulses are 1+ on the right side and 1+ on the left side.  ?     Posterior tibial pulses are 1+ on the right side and 1+ on the left side.  ?   Heart sounds: Normal heart sounds.  ?   Comments: Intermittent premature beats auscultated today ?Pulmonary:  ?   Effort: Pulmonary effort is normal.  ?   Breath sounds: Normal breath sounds.  ?Abdominal:  ?   General: Bowel sounds are normal. There is no distension.  ?   Palpations: Abdomen is soft. There is no mass.  ?   Tenderness: There is no abdominal tenderness. There is no right CVA tenderness, left CVA tenderness, guarding or rebound.  ?   Hernia: No hernia is present.  ?Musculoskeletal:     ?   General: Normal range of motion.  ?   Cervical back: Normal range of motion and neck supple.  ?  Right foot: Normal range of motion. No deformity or bunion.  ?   Left foot: Normal range of motion. No deformity or bunion.  ?Feet:  ?   Right foot:  ?   Protective Sensation: 10 sites tested.  10 sites sensed.  ?   Skin integrity: Skin integrity normal.  ?   Toenail Condition: Right toenails are normal.  ?   Left foot:  ?   Protective Sensation: 10 sites tested.  10 sites sensed.  ?   Skin integrity: Skin integrity normal.  ?   Toenail Condition: Left toenails are normal.  ?   Comments: Mildly delayed capillary refill present, bilaterally.  ?Mild swelling present on dorsal surface of right foot.  ?Lymphadenopathy:  ?   Cervical: No cervical adenopathy.  ?Skin: ?   General: Skin is warm and dry.  ?   Capillary Refill: Capillary refill takes 2 to 3 seconds.  ?Neurological:  ?    General: No focal deficit present.  ?   Mental Status: She is alert and oriented to person, place, and time. Mental status is at baseline.  ?Psychiatric:     ?   Mood and Affect: Mood normal.     ?   Behavior: Behavior normal.     ?   Thought Content: Thought content normal.     ?   Judgment: Judgment normal.  ?  ? ?  01/18/2021  ? 11:28 AM 01/03/2018  ?  2:09 PM 12/07/2016  ?  6:02 PM 02/28/2016  ? 12:47 PM 12/28/2014  ? 12:43 PM  ?Advanced Directives  ?Does Patient Have a Medical Advance Directive? No No No No No  ?Would patient like information on creating a medical advance directive? Yes (MAU/Ambulatory/Procedural Areas - Information given) Yes (MAU/Ambulatory/Procedural Areas - Information given) No - Patient declined  No - patient declined information  ? ? ?Current Medications (verified) ?Outpatient Encounter Medications as of 08/24/2021  ?Medication Sig  ? acetaminophen (TYLENOL) 500 MG tablet Take 2 tablets by mouth as needed.  ? acetic acid-hydrocortisone (VOSOL-HC) OTIC solution Place 4 drops into the right ear 2 (two) times daily.  ? Bioflavonoid Products (VITAMIN C) CHEW Chew by mouth. With vitamin D and elderberry  ? busPIRone (BUSPAR) 5 MG tablet Take 1 tablet (5 mg total) by mouth 2 (two) times daily.  ? Calcium Carbonate (CALCIUM 500 PO) Take by mouth.  ? Flaxseed, Linseed, (FLAXSEED OIL PO) Take 1,500 mg by mouth 2 (two) times daily.  ? fluconazole (DIFLUCAN) 150 MG tablet Take 1 tablet by mouth every 3 days.  ? FLUZONE HIGH-DOSE QUADRIVALENT 0.7 ML SUSY   ? glucose blood (ACCU-CHEK SMARTVIEW) test strip 1 each by Other route as needed for other. Use as instructed  ? JARDIANCE 10 MG TABS tablet TAKE 1 TABLET BY MOUTH EVERY DAY BEFORE BREAKFAST  ? LINZESS 145 MCG CAPS capsule TAKE 1 CAPSULE BY MOUTH EVERY DAY  ? meclizine (ANTIVERT) 25 MG tablet Take 1 tablet (25 mg total) by mouth 2 (two) times daily as needed for dizziness.  ? metoprolol succinate (TOPROL-XL) 25 MG 24 hr tablet TAKE 2 TABLETS BY MOUTH  EVERY DAY  ? Multiple Vitamins-Minerals (MULTIVITAMIN ADULT PO) Take 1 tablet by mouth daily.  ? nystatin (MYCOSTATIN/NYSTOP) powder APPLY TO AFFECTED AREA TWICE A DAY  ? nystatin ointment (MYCOSTATIN) Apply 1 application topically 2 (two) times daily.  ? pantoprazole (PROTONIX) 40 MG tablet TAKE 1 TABLET BY MOUTH TWICE A DAY  ? sitaGLIPtin-metformin (JANUMET) 50-500 MG tablet  Take 1 tablet by mouth 2 (two) times daily with a meal.  ? telmisartan-hydrochlorothiazide (MICARDIS HCT) 80-25 MG tablet TAKE 1 TABLET BY MOUTH EVERY DAY  ? venlafaxine XR (EFFEXOR-XR) 150 MG 24 hr capsule TAKE 1 CAPSULE BY MOUTH DAILY WITH BREAKFAST.  ? ?No facility-administered encounter medications on file as of 08/24/2021.  ? ? ?Allergies (verified) ?Aspirin, Ibuprofen, Levofloxacin, Meloxicam, Morphine, Naprosyn  [naproxen], Nsaids, Quinolones, Robinul  [glycopyrrolate], Penicillin v potassium, and Sulfa antibiotics  ? ?History: ?Past Medical History:  ?Diagnosis Date  ? Anxiety   ? Depression   ? Depression   ? Phreesia 07/02/2020  ? Diabetes mellitus without complication ([place])   ? GERD (gastroesophageal reflux disease)   ? Hypertension   ? IBS (irritable bowel syndrome)   ? Neuromuscular disorder ([place])   ? ?Past Surgical History:  ?Procedure Laterality Date  ? BREAST EXCISIONAL BIOPSY    ? CESAREAN SECTION N/A   ? [facility]reesia 07/02/2020  ? CHOLECYSTECTOMY    ? EXCISION / BIOPSY BREAST / NIPPLE / DUCT Right 1973  ? duct removed  ? SPINE SURGERY N/A   ? Phreesia 07/02/2020  ? TUBAL LIGATION N/A   ? Phreesia 07/02/2020  ? ?Family History  ?Problem Relation Age of Onset  ? Diabetes Mother   ? Hypertension Mother   ? Coronary artery disease Father   ? Glaucoma Father   ? Breast cancer Neg Hx   ? ?Social History  ? ?Socioeconomic History  ? Marital status: Married  ?  Spouse name: Not on file  ? Number of children: Not on file  ? Years of education: Not on file  ? Highest education level: Not on file  ?Occupational History  ? Not on file   ?Tobacco Use  ? Smoking status: Former  ? Smokeless tobacco: Never  ?Substance and Sexual Activity  ? Alcohol use: No  ?  Alcohol/week: 0.0 standard drinks  ? Drug use: Never  ? Sexual activity: Not Currently

## 2021-08-25 ENCOUNTER — Other Ambulatory Visit: Payer: Self-pay | Admitting: Psychiatry

## 2021-08-25 MED ORDER — ALPRAZOLAM 0.5 MG PO TABS: 0.5000 mg | ORAL_TABLET | Freq: Every evening | ORAL | 0 refills | Status: DC | PRN

## 2021-08-25 NOTE — Telephone Encounter (Signed)
Ordered xanax refill.  ?I have utilized the [place] Controlled Substances Reporting System (PMPAWARxE) to confirm adherence regarding the patient's medication. My review reveals appropriate prescription fills.  ?

## 2021-08-27 NOTE — Progress Notes (Signed)
HgbA1c a little improved. Elevated lipid panel. She is unable to tolerate statin. Consider addition of lovaza. Waiting on thyroid panel.

## 2021-08-28 NOTE — Progress Notes (Signed)
Virtual Visit via Video Note ? ?I connected with Lisa Gutierrez on 08/30/21 at  1:00 PM EDT by a video enabled telemedicine application and verified that I am speaking with the correct person using two identifiers. ? ?Location: ?Patient: home ?Provider: office ?Persons participated in the visit- patient, provider  ?  ?I discussed the limitations of evaluation and management by telemedicine and the availability of in person appointments. The patient expressed understanding and agreed to proceed. ? ?  ?I discussed the assessment and treatment plan with the patient. The patient was provided an opportunity to ask questions and all were answered. The patient agreed with the plan and demonstrated an understanding of the instructions. ?  ?The patient was advised to call back or seek an in-person evaluation if the symptoms worsen or if the condition fails to improve as anticipated. ? ?I provided 15 minutes of non-face-to-face time during this encounter. ? ? ?Lisa Clay, MD ? ? ? ?BH MD/PA/NP OP Progress Note ? ?08/30/2021 1:28 PM ?Lisa Gutierrez  ?MRN:  [id number] ? ?Chief Complaint:  ?Chief Complaint  ?Patient presents with  ? Follow-up  ? Depression  ? ?HPI:  ?This is a follow-up appointment for depression and anxiety.  ?She states that she had her time on weekend.  She states that she did not have either of her sons on Mother's Day.  She cried all weekend.  She states that she was trying to do the best.  She went to see a dermatologist for her skin condition.  She spends time with her dog.  She enjoys doing gardening, which is the most favorite things in the world.  She states that it has been difficult for her to see both psychiatrist and therapist due to financial strain.  She agrees to reach out to her PCP to see if the provider can take over her medication.  She feels down and anxious at times.  She takes Xanax at night for anxiety/insomnia.  She is not interested in trying trazodone at this time if  she were not able to take Xanax.  She states that she tends to go to food for comfort.  She denies SI.  She has occasional panic attacks.  She would like to stay on the current medication regimen at this time.  ? ?Daily routine: takes care of her dogs, cats, house chores ?Exercise: unable to do due to knee pain ?Employment:  ?Support: none (talks with her oldest granddaughter, who has left to college) ?Household: husband ?Marital status: married for 52 years  ?Number of children: 2 sons.  ? ? ?Wt Readings from Last 3 Encounters:  ?08/24/21 245 lb 1.9 oz (111.2 kg)  ?07/19/21 234 lb (106.1 kg)  ?05/03/21 234 lb 14.4 oz (106.5 kg)  ?  ? ? ?Visit Diagnosis:  ?  ICD-10-CM   ?1. MDD (major depressive disorder), recurrent episode, mild ([place])  F33.0   ?  ?2. Anxiety  F41.9   ?  ? ? ?Past Psychiatric History: Please see initial evaluation for full details. I have reviewed the history. No updates at this time.  ?  ? ?Past Medical History:  ?Past Medical History:  ?Diagnosis Date  ? Anxiety   ? Depression   ? Depression   ? Phreesia 07/02/2020  ? Diabetes mellitus without complication ([place])   ? GERD (gastroesophageal reflux disease)   ? Hypertension   ? IBS (irritable bowel syndrome)   ? Neuromuscular disorder ([place])   ?  ?Past Surgical History:  ?Procedure  Laterality Date  ? BREAST EXCISIONAL BIOPSY    ? CESAREAN SECTION N/A   ? Phreesia 07/02/2020  ? CHOLECYSTECTOMY    ? EXCISION / BIOPSY BREAST / NIPPLE / DUCT Right 1973  ? duct removed  ? SPINE SURGERY N/A   ? Phreesia 07/02/2020  ? TUBAL LIGATION N/A   ? Phreesia 07/02/2020  ? ? ?Family Psychiatric History: Please see initial evaluation for full details. I have reviewed the history. No updates at this time.  ?  ? ?Family History:  ?Family History  ?Problem Relation Age of Onset  ? Diabetes Mother   ? Hypertension Mother   ? Coronary artery disease Father   ? Glaucoma Father   ? Breast cancer Neg Hx   ? ? ?Social History:  ?Social History  ? ?Socioeconomic History  ?  Marital status: Married  ?  Spouse name: Not on file  ? Number of children: Not on file  ? Years of education: Not on file  ? Highest education level: Not on file  ?Occupational History  ? Not on file  ?Tobacco Use  ? Smoking status: Former  ? Smokeless tobacco: Never  ?Substance and Sexual Activity  ? Alcohol use: No  ?  Alcohol/week: 0.0 standard drinks  ? Drug use: Never  ? Sexual activity: Not Currently  ?  Partners: Male  ?Other Topics Concern  ? Not on file  ?Social History Narrative  ? Not on file  ? ?Social Determinants of Health  ? ?Financial Resource Strain: Not on file  ?Food Insecurity: Not on file  ?Transportation Needs: Not on file  ?Physical Activity: Not on file  ?Stress: Not on file  ?Social Connections: Not on file  ? ? ?Allergies:  ?Allergies  ?Allergen Reactions  ? Aspirin Other (See Comments)  ?  GI upset  ? Ibuprofen Other (See Comments)  ?  GERD  ? Levofloxacin Other (See Comments)  ?  Weight gain  ? Meloxicam Other (See Comments)  ?  GI upset  ? Morphine Other (See Comments)  ? Naprosyn  [Naproxen] Other (See Comments)  ?  GI upset  ? Nsaids Other (See Comments)  ?  GI upset  ? Quinolones   ? Robinul  [Glycopyrrolate] Nausea And Vomiting  ? Penicillin V Potassium Rash  ? Sulfa Antibiotics Rash  ? ? ?Metabolic Disorder Labs: ?Lab Results  ?Component Value Date  ? HGBA1C 7.7 (H) 08/24/2021  ? ?No results found for: PROLACTIN ?Lab Results  ?Component Value Date  ? CHOL 195 08/24/2021  ? TRIG 233 (H) 08/24/2021  ? HDL 31 (L) 08/24/2021  ? CHOLHDL 6.3 (H) 08/24/2021  ? LDLCALC 123 (H) 08/24/2021  ? ?Lab Results  ?Component Value Date  ? TSH 1.040 08/24/2021  ? ? ?Therapeutic Level Labs: ?No results found for: LITHIUM ?No results found for: VALPROATE ?No components found for:  CBMZ ? ?Current Medications: ?Current Outpatient Medications  ?Medication Sig Dispense Refill  ? acetaminophen (TYLENOL) 500 MG tablet Take 2 tablets by mouth as needed.    ? acetic acid-hydrocortisone (VOSOL-HC) OTIC  solution Place 4 drops into the right ear 2 (two) times daily. 10 mL 0  ? [START ON 09/25/2021] ALPRAZolam (XANAX) 0.5 MG tablet Take 1 tablet (0.5 mg total) by mouth at bedtime as needed for anxiety. 30 tablet 1  ? Bioflavonoid Products (VITAMIN C) CHEW Chew by mouth. With vitamin D and elderberry    ? [START ON 09/18/2021] busPIRone (BUSPAR) 5 MG tablet Take 1 tablet (5 mg  total) by mouth 2 (two) times daily. 180 tablet 0  ? Calcium Carbonate (CALCIUM 500 PO) Take by mouth.    ? Flaxseed, Linseed, (FLAXSEED OIL PO) Take 1,500 mg by mouth 2 (two) times daily.    ? fluconazole (DIFLUCAN) 150 MG tablet Take 1 tablet by mouth every 3 days. 5 tablet 1  ? FLUZONE HIGH-DOSE QUADRIVALENT 0.7 ML SUSY     ? glucose blood (ACCU-CHEK SMARTVIEW) test strip 1 each by Other route as needed for other. Use as instructed 200 each 11  ? JARDIANCE 10 MG TABS tablet TAKE 1 TABLET BY MOUTH EVERY DAY BEFORE BREAKFAST 30 tablet 1  ? LINZESS 145 MCG CAPS capsule TAKE 1 CAPSULE BY MOUTH EVERY DAY 30 capsule 2  ? meclizine (ANTIVERT) 25 MG tablet Take 1 tablet (25 mg total) by mouth 2 (two) times daily as needed for dizziness. 60 tablet 1  ? metoprolol succinate (TOPROL-XL) 25 MG 24 hr tablet TAKE 2 TABLETS BY MOUTH EVERY DAY 180 tablet 1  ? Multiple Vitamins-Minerals (MULTIVITAMIN ADULT PO) Take 1 tablet by mouth daily.    ? nystatin (MYCOSTATIN/NYSTOP) powder APPLY TO AFFECTED AREA TWICE A DAY 60 g 3  ? nystatin ointment (MYCOSTATIN) Apply 1 application topically 2 (two) times daily. 30 g 1  ? pantoprazole (PROTONIX) 40 MG tablet TAKE 1 TABLET BY MOUTH TWICE A DAY 180 tablet 1  ? sitaGLIPtin-metformin (JANUMET) 50-500 MG tablet Take 1 tablet by mouth 2 (two) times daily with a meal. 180 tablet 3  ? telmisartan-hydrochlorothiazide (MICARDIS HCT) 80-25 MG tablet TAKE 1 TABLET BY MOUTH EVERY DAY 90 tablet 1  ? venlafaxine XR (EFFEXOR-XR) 150 MG 24 hr capsule TAKE 1 CAPSULE BY MOUTH DAILY WITH BREAKFAST. 90 capsule 0  ? ?No current  facility-administered medications for this visit.  ? ? ? ?Musculoskeletal: ?Strength & Muscle Tone:  N/A ?Gait & Station:  N/A ?Patient leans: N/A ? ?Psychiatric Specialty Exam: ?Review of Systems  ?Psychiatric/Behavioral:  Po

## 2021-08-29 DIAGNOSIS — D2271 Melanocytic nevi of right lower limb, including hip: Secondary | ICD-10-CM | POA: Diagnosis not present

## 2021-08-29 DIAGNOSIS — D2262 Melanocytic nevi of left upper limb, including shoulder: Secondary | ICD-10-CM | POA: Diagnosis not present

## 2021-08-29 DIAGNOSIS — D2261 Melanocytic nevi of right upper limb, including shoulder: Secondary | ICD-10-CM | POA: Diagnosis not present

## 2021-08-29 DIAGNOSIS — D2272 Melanocytic nevi of left lower limb, including hip: Secondary | ICD-10-CM | POA: Diagnosis not present

## 2021-08-29 DIAGNOSIS — L281 Prurigo nodularis: Secondary | ICD-10-CM | POA: Diagnosis not present

## 2021-08-29 DIAGNOSIS — L821 Other seborrheic keratosis: Secondary | ICD-10-CM | POA: Diagnosis not present

## 2021-08-29 DIAGNOSIS — D225 Melanocytic nevi of trunk: Secondary | ICD-10-CM | POA: Diagnosis not present

## 2021-08-29 LAB — CBC WITH DIFFERENTIAL/PLATELET
Basophils Absolute: 0 10*3/uL (ref 0.0–0.2)
Basos: 0 %
EOS (ABSOLUTE): 0 10*3/uL (ref 0.0–0.4)
Eos: 0 %
Hematocrit: 43.6 % (ref 34.0–46.6)
Hemoglobin: 14.5 g/dL (ref 11.1–15.9)
Immature Grans (Abs): 0 10*3/uL (ref 0.0–0.1)
Immature Granulocytes: 0 %
Lymphocytes Absolute: 1.7 10*3/uL (ref 0.7–3.1)
Lymphs: 23 %
MCH: 30.3 pg (ref 26.6–33.0)
MCHC: 33.3 g/dL (ref 31.5–35.7)
MCV: 91 fL (ref 79–97)
Monocytes Absolute: 0.7 10*3/uL (ref 0.1–0.9)
Monocytes: 10 %
Neutrophils Absolute: 4.9 10*3/uL (ref 1.4–7.0)
Neutrophils: 67 %
Platelets: 224 10*3/uL (ref 150–450)
RBC: 4.78 x10E6/uL (ref 3.77–5.28)
RDW: 12.8 % (ref 11.7–15.4)
WBC: 7.3 10*3/uL (ref 3.4–10.8)

## 2021-08-29 LAB — LIPID PANEL
Chol/HDL Ratio: 6.3 ratio — ABNORMAL HIGH (ref 0.0–4.4)
Cholesterol, Total: 195 mg/dL (ref 100–199)
HDL: 31 mg/dL — ABNORMAL LOW (ref >39)
LDL Chol Calc (NIH): 123 mg/dL — ABNORMAL HIGH (ref 0–99)
Triglycerides: 233 mg/dL — ABNORMAL HIGH (ref 0–149)
VLDL Cholesterol Cal: 41 mg/dL — ABNORMAL HIGH (ref 5–40)

## 2021-08-29 LAB — COMPREHENSIVE METABOLIC PANEL
ALT: 36 IU/L — ABNORMAL HIGH (ref 0–32)
AST: 33 IU/L (ref 0–40)
Albumin/Globulin Ratio: 1.5 (ref 1.2–2.2)
Albumin: 4.3 g/dL (ref 3.8–4.8)
Alkaline Phosphatase: 54 IU/L (ref 44–121)
BUN/Creatinine Ratio: 18 (ref 12–28)
BUN: 14 mg/dL (ref 8–27)
Bilirubin Total: 0.5 mg/dL (ref 0.0–1.2)
CO2: 22 mmol/L (ref 20–29)
Calcium: 10.1 mg/dL (ref 8.7–10.3)
Chloride: 98 mmol/L (ref 96–106)
Creatinine, Ser: 0.77 mg/dL (ref 0.57–1.00)
Globulin, Total: 2.9 g/dL (ref 1.5–4.5)
Glucose: 171 mg/dL — ABNORMAL HIGH (ref 70–99)
Potassium: 3.9 mmol/L (ref 3.5–5.2)
Sodium: 138 mmol/L (ref 134–144)
Total Protein: 7.2 g/dL (ref 6.0–8.5)
eGFR: 83 mL/min/{1.73_m2} (ref >59)

## 2021-08-29 LAB — C-PEPTIDE: C-Peptide: 7.3 ng/mL — ABNORMAL HIGH (ref 1.1–4.4)

## 2021-08-29 LAB — HEMOGLOBIN A1C
Est. average glucose Bld gHb Est-mCnc: 174 mg/dL
Hgb A1c MFr Bld: 7.7 % — ABNORMAL HIGH (ref 4.8–5.6)

## 2021-08-29 LAB — T4, FREE: Free T4: 1.13 ng/dL (ref 0.82–1.77)

## 2021-08-29 LAB — TSH: TSH: 1.04 u[IU]/mL (ref 0.450–4.500)

## 2021-08-30 ENCOUNTER — Telehealth (INDEPENDENT_AMBULATORY_CARE_PROVIDER_SITE_OTHER): Payer: PPO | Admitting: Psychiatry

## 2021-08-30 ENCOUNTER — Encounter: Payer: Self-pay | Admitting: Psychiatry

## 2021-08-30 DIAGNOSIS — F419 Anxiety disorder, unspecified: Secondary | ICD-10-CM | POA: Diagnosis not present

## 2021-08-30 DIAGNOSIS — F330 Major depressive disorder, recurrent, mild: Secondary | ICD-10-CM

## 2021-08-30 MED ORDER — ALPRAZOLAM 0.5 MG PO TABS: 0.5000 mg | ORAL_TABLET | Freq: Every evening | ORAL | 1 refills | Status: DC | PRN

## 2021-08-30 MED ORDER — BUSPIRONE HCL 5 MG PO TABS: 5.0000 mg | ORAL_TABLET | Freq: Two times a day (BID) | ORAL | 0 refills | Status: DC

## 2021-08-31 ENCOUNTER — Other Ambulatory Visit: Payer: Self-pay | Admitting: Nurse Practitioner

## 2021-08-31 ENCOUNTER — Other Ambulatory Visit: Payer: Self-pay

## 2021-08-31 DIAGNOSIS — N390 Urinary tract infection, site not specified: Secondary | ICD-10-CM

## 2021-08-31 DIAGNOSIS — B379 Candidiasis, unspecified: Secondary | ICD-10-CM

## 2021-08-31 DIAGNOSIS — I10 Essential (primary) hypertension: Secondary | ICD-10-CM

## 2021-08-31 MED ORDER — FLUCONAZOLE 150 MG PO TABS: ORAL_TABLET | ORAL | 1 refills | Status: DC

## 2021-09-13 ENCOUNTER — Ambulatory Visit (INDEPENDENT_AMBULATORY_CARE_PROVIDER_SITE_OTHER): Payer: PPO | Admitting: Licensed Clinical Social Worker

## 2021-09-13 DIAGNOSIS — F419 Anxiety disorder, unspecified: Secondary | ICD-10-CM

## 2021-09-13 DIAGNOSIS — F330 Major depressive disorder, recurrent, mild: Secondary | ICD-10-CM

## 2021-09-13 NOTE — Progress Notes (Signed)
Virtual Visit via Video Note  I connected with Lisa Gutierrez on 09/13/21 at  1:00 PM EDT by a video enabled telemedicine application and verified that I am speaking with the correct person using two identifiers.  Location: Patient: home Provider: remote office [place], [place])   I discussed the limitations of evaluation and management by telemedicine and the availability of in person appointments. The patient expressed understanding and agreed to proceed.   I discussed the assessment and treatment plan with the patient. The patient was provided an opportunity to ask questions and all were answered. The patient agreed with the plan and demonstrated an understanding of the instructions.   The patient was advised to call back or seek an in-person evaluation if the symptoms worsen or if the condition fails to improve as anticipated.  I provided 45 minutes of non-face-to-face time during this encounter.   Lisa Gutierrez R Cherica Heiden, LCSW   THERAPIST PROGRESS NOTE  Session Time: 3:15-4p  Participation Level: Active  Behavioral Response: NAAlertDepressed and Dysphoric  Type of Therapy: Individual Therapy  Treatment Goals addressed: Problem: Decrease depressive symptoms and improve levels of effective functioning Goal: LTG: Reduce frequency, intensity, and duration of depression symptoms as evidenced by: SSB input needed on appropriate metric Outcome: Not Progressing Note: Pt reports continuing tearfulness, sad affect, hopelessness, low motivation, low initiative. Dysphoric mood.  Goal: STG: '@PREFFIRSTNAME'$ @ will participate in at least 80% of scheduled individual psychotherapy sessions Outcome: Progressing Note: Pt on time and engaged throughout session  Intervention: Encourage verbalization of feelings/concerns/expectations Intervention: Provide emotional support Intervention: Encourage patient to identify triggers Intervention: Manage signs of labile or escalating  emotions  ProgressTowards Goals: Not Progressing  Interventions: CBT and Supportive (see above)  Summary: Lisa Gutierrez is a 71 y.o. female who presents with continuing symptoms related to depression. Pt reports she is experiencing tearfulness, worrying, flashbacks/intrusive thoughts, isolation/withdrawal, and an overall sense of "doom and gloom".   Allowed pt to explore and express thoughts and feelings associated with recent life situations and external stressors. Pt reports that mother's day was a stressor for her. Pt reports that she did not see either of her sons on Mother's day. Pt states that one son said that he had gout and another son had made plans to go to [place] to attend a graduation. Pt felt like sons put the other things going on in their lives before her. Spent several minutes reframing the situation and allowing pt to see from a differing perspective.   Discussed ongoing stress--pt has sores on her arms and legs that physicians think are triggered by stress. Reviewed stress management strategies and allowed pt to engage in some guided imagery. Encouraged pt to do this at least two times per day to help manage stress. Pt reports that she is compliant with her medication.  Discussed wellness/self care. Pt admits that physical activity is very hard for her to do.   Continued recommendations are as follows: self care behaviors, positive social engagements, focusing on overall work/home/life balance, and focusing on positive physical and emotional wellness.   Pt states that finances are difficult now--requests appts every other month or alternating between therapist/psychiatrist.   Suicidal/Homicidal: No  Therapist Response: Pt is continuing to apply interventions learned in session into daily life situations. Pt is currently on track to meet goals utilizing interventions mentioned above. Personal growth and progress noted. Treatment to continue as indicated.   Plan:  Return again 2 months (per patient).  Diagnosis: MDD (major depressive disorder), recurrent  episode, mild ([place])  Anxiety  Collaboration of Care: Other pt encouraged to continue with psychiatrist of record, Dr. Modesta Messing  Patient/Guardian was advised Release of Information must be obtained prior to any record release in order to collaborate their care with an outside provider. Patient/Guardian was advised if they have not already done so to contact the registration department to sign all necessary forms in order for [place] to release information regarding their care.   Consent: Patient/Guardian gives verbal consent for treatment and assignment of benefits for services provided during this visit. Patient/Guardian expressed understanding and agreed to proceed.   [place], LCSW 09/13/2021

## 2021-09-18 ENCOUNTER — Telehealth: Payer: Self-pay | Admitting: Pharmacist

## 2021-09-18 ENCOUNTER — Other Ambulatory Visit: Payer: Self-pay

## 2021-09-18 ENCOUNTER — Other Ambulatory Visit: Payer: Self-pay | Admitting: Nurse Practitioner

## 2021-09-18 DIAGNOSIS — Z596 Low income: Secondary | ICD-10-CM

## 2021-09-18 DIAGNOSIS — B372 Candidiasis of skin and nail: Secondary | ICD-10-CM

## 2021-09-18 DIAGNOSIS — N390 Urinary tract infection, site not specified: Secondary | ICD-10-CM

## 2021-09-18 MED ORDER — NYSTATIN 100000 UNIT/GM EX POWD: CUTANEOUS | 3 refills | Status: DC

## 2021-09-18 NOTE — Progress Notes (Signed)
[facility]) Care Management  [place]   09/18/2021  Lisa Gutierrez Nov 30, 1950 [id number]   Reason for referral: Medication Assistance  Referral source: Patient Self Referral Current insurance:[employer]   HPI: Patient is a 71 year old female with multiple medical conditions including hypertension, type 2 diabetes,  GAD, GERD, type 2 diabetes, hyperlipidemia (with statin intolerance)  and chronic pain in her left knee.   Objective: Lab Results  Component Value Date   CREATININE 0.77 08/24/2021   CREATININE 0.64 12/07/2016   CREATININE 0.70 08/03/2013    Lab Results  Component Value Date   HGBA1C 7.7 (H) 08/24/2021    Lipid Panel     Component Value Date/Time   CHOL 195 08/24/2021 1128   TRIG 233 (H) 08/24/2021 1128   HDL 31 (L) 08/24/2021 1128   CHOLHDL 6.3 (H) 08/24/2021 1128   LDLCALC 123 (H) 08/24/2021 1128    BP Readings from Last 3 Encounters:  08/24/21 133/82  12/29/20 128/89  07/05/20 122/86    Allergies  Allergen Reactions   Aspirin Other (See Comments)    GI upset   Ibuprofen Other (See Comments)    GERD   Levofloxacin Other (See Comments)    Weight gain   Meloxicam Other (See Comments)    GI upset   Morphine Other (See Comments)   Naprosyn  [Naproxen] Other (See Comments)    GI upset   Nsaids Other (See Comments)    GI upset   Quinolones    Robinul  [Glycopyrrolate] Nausea And Vomiting   Penicillin V Potassium Rash   Sulfa Antibiotics Rash    Medications Reviewed Today     Reviewed by [name], MD (Psychiatrist) on 08/30/21 at 1328  Med List Status: <None>   Medication Order Taking? Sig Documenting Provider Last Dose Status Informant  acetaminophen (TYLENOL) 500 MG tablet [id number] No Take 2 tablets by mouth as needed. [provider] Taking Active            Med Note Lacinda Axon, Jamie Kato   Tue Feb 28, 2016 12:50 PM) Received from: [place]: 1-2  tablets by mouth single dose as needed for pain  acetic acid-hydrocortisone (VOSOL-HC) OTIC solution [id number] No Place 4 drops into the right ear 2 (two) times daily. Ronnell Freshwater, NP Taking Active   ALPRAZolam Duanne Moron) 0.5 MG tablet [id number]  Take 1 tablet (0.5 mg total) by mouth at bedtime as needed for anxiety. [name], MD  Active   Bioflavonoid Products (VITAMIN C) CHEW [id number] No Chew by mouth. With vitamin D and elderberry [provider] Taking Active Self  busPIRone (BUSPAR) 5 MG tablet [id number]  Take 1 tablet (5 mg total) by mouth 2 (two) times daily. [name], MD  Active   Calcium Carbonate (CALCIUM 500 PO) [id number] No Take by mouth. [provider] Taking Active Self  Flaxseed, Linseed, (FLAXSEED OIL PO) [id number] No Take 1,500 mg by mouth 2 (two) times daily. [provider] Taking Active   fluconazole (DIFLUCAN) 150 MG tablet [id number] No Take 1 tablet by mouth every 3 days. Ronnell Freshwater, NP Taking Active   FLUZONE HIGH-DOSE QUADRIVALENT 0.7 ML SUSY [id number] No  [provider] Taking Active   glucose blood (ACCU-CHEK SMARTVIEW) test strip [id number] No 1 each by Other route as needed for other. Use as instructed Lavera Guise, MD Taking Active   JARDIANCE 10 MG TABS tablet [id number] No TAKE 1 TABLET BY  MOUTH EVERY DAY BEFORE BREAKFAST Ronnell Freshwater, NP Taking Active   LINZESS 145 MCG CAPS capsule [id number] No TAKE 1 CAPSULE BY MOUTH EVERY DAY Boscia, Heather E, NP Taking Active   meclizine (ANTIVERT) 25 MG tablet [id number] No Take 1 tablet (25 mg total) by mouth 2 (two) times daily as needed for dizziness. Ronnell Freshwater, NP Taking Active   metoprolol succinate (TOPROL-XL) 25 MG 24 hr tablet [id number] No TAKE 2 TABLETS BY MOUTH EVERY DAY Boscia, Heather E, NP Taking Active   Multiple Vitamins-Minerals (MULTIVITAMIN ADULT PO) [id number] No Take 1 tablet by mouth daily. [provider] Taking Active   nystatin  (MYCOSTATIN/NYSTOP) powder [id number] No APPLY TO AFFECTED AREA TWICE A DAY Boscia, Heather E, NP Taking Active   nystatin ointment (MYCOSTATIN) [id number] No Apply 1 application topically 2 (two) times daily. Ronnell Freshwater, NP Taking Active   pantoprazole (PROTONIX) 40 MG tablet [id number] No TAKE 1 TABLET BY MOUTH TWICE A DAY Boscia, Heather E, NP Taking Active   sitaGLIPtin-metformin (JANUMET) 50-500 MG tablet [id number] No Take 1 tablet by mouth 2 (two) times daily with a meal. Ronnell Freshwater, NP Taking Active   telmisartan-hydrochlorothiazide (MICARDIS HCT) 80-25 MG tablet [id number] No TAKE 1 TABLET BY MOUTH EVERY DAY Boscia, Heather E, NP Taking Active   venlafaxine XR (EFFEXOR-XR) 150 MG 24 hr capsule [id number] No TAKE 1 CAPSULE BY MOUTH DAILY WITH BREAKFAST. [name], MD Taking Active              ASSESSMENT: Drugs sorted by system:  Neurologic/Psychologic: Buspirone Alprazolam Venlafaxine XR  Cardiovascular: Metoprolol XL Telmisartan/HCTZ  Gastrointestinal: Linzess Meclizine Pantoprazole  Endocrine: Janumet Jardiance  Topical: Nystatin Ointment Nystatin Powder  Vitamins/Minerals/Supplements: Multiple Vitamin Bioflavonoid Calcium Carbonate Flaxseed  Misc Vosol HC Ear Drops-Patient said not taking Acetaminophen-PRN only  Medication Review Findings:  Jardiance-not taking due to cost.  HgA1c 7.7%  Patient stated she is getting Janumet through [facility] Patient Assistance program.  Vania Rea is available through [place] application was submitted to [facility] for the patient in October of last year.  However, Durenda Hurt said the patient's application was canceled because it was missing the quantity and refills on the prescription.  Since it is a new year, the recommendation was to submit a new application.  Statin Intolerance-patient has documented statin intolerance. It is unclear if she ever tried alternative dosing  with very low dose rosuvastatin, atorvastatin, or pitavastatin.   If patient truly cannot tolerate statins, a statin exclusion code should be added to her next PCP visit to remove the patient from the measure.  Some acceptable statin exclusion codes are:   Drug Induced Myopathy  G72.0   Myopathy, unspecified  G72.9   Myositis, unspecified  M60.9   Rhabdomyolysis  M07.68   Alcoholic fatty liver  G88.1   Cirrhosis of liver  K74.69   Prediabetes  R73.03   PCOS  E28.2      Plan: Send patient medication assistance applications for Jardiance and Linzess. Route note to provider. Jill Simcox, CPhT will follow up with the patient on her applications.  [name], PharmD, [place] Clinical Pharmacist [phone]

## 2021-09-18 NOTE — Progress Notes (Signed)
Urinalysis with reflex microscopic and urine culture orders placed in epic.

## 2021-09-25 ENCOUNTER — Telehealth: Payer: Self-pay | Admitting: Pharmacy Technician

## 2021-09-25 DIAGNOSIS — Z596 Low income: Secondary | ICD-10-CM

## 2021-09-25 NOTE — Progress Notes (Signed)
[place] [facility])                                            [place] Team    09/25/2021  Ashonti Leandro April 17, 1950 [id number]                                      Medication Assistance Referral  Referral From: [place]  Medication/Company: Vania Rea / [employer] Patient application portion:  Mailed Provider application portion: Faxed  to [name], NP Provider address/fax verified via: Office website  Medication/Company: Rolan Lipa / [employer] Patient application portion:  Mailed Provider application portion: Faxed  to [name], NP Provider address/fax verified via: Office website   Tsugio Elison P. Other [place], [place]  [phone]

## 2021-09-28 ENCOUNTER — Ambulatory Visit
Admission: RE | Admit: 2021-09-28 | Discharge: 2021-09-28 | Disposition: A | Discharge status: Home / Self Care | Payer: PPO | Source: Ambulatory Visit | Attending: Nurse Practitioner | Admitting: Nurse Practitioner

## 2021-09-28 DIAGNOSIS — Z1231 Encounter for screening mammogram for malignant neoplasm of breast: Secondary | ICD-10-CM | POA: Insufficient documentation

## 2021-10-02 ENCOUNTER — Encounter: Payer: Self-pay | Admitting: Nurse Practitioner

## 2021-10-10 ENCOUNTER — Telehealth: Payer: Self-pay | Admitting: Pharmacy Technician

## 2021-10-10 DIAGNOSIS — Z596 Low income: Secondary | ICD-10-CM

## 2021-10-10 NOTE — Progress Notes (Signed)
[facility])                                            [facility] Quality Pharmacy Team    10/10/2021  Alden Ritzert [birth year] [id number]  Received both patient and provider portion(s) of patient assistance application(s) for Jardiance and Linzess. Faxed completed application and required documents into [employer] and [employer] respectively.   Jackilyn Umphlett P. Eliabeth Shoff, CPhT [facility] [employer]  [phone]

## 2021-10-18 ENCOUNTER — Telehealth: Payer: Self-pay | Admitting: Pharmacy Technician

## 2021-10-18 DIAGNOSIS — Z596 Low income: Secondary | ICD-10-CM

## 2021-10-18 NOTE — Progress Notes (Addendum)
ADDENDUM 10/20/2021  Care coordination call placed to [employer] in regard to [place] application.  Spoke to Guinda who informs application was received on 10/03/21 and is currently in the Benefits Verification phase which typically takes 2-3 business days, but at times could take longer.  She informs to check back sometime next week.                                          [place] [facility])                                            [place] Team    10/18/2021  Lisa Gutierrez 07-21-1950 [id number]  Two care coordination calls placed to day. One to [facility] cares in regard to [facility] application and the second one to [employer] in regard to [place] application.  1st Care coordination call placed to [employer] in regard to [facility] application.  Spoke to Lavina who informed patient is APPROVED 10/10/21-04/15/22. She informs medication should arrive to patient's home in the next 5-7 business days. Patient is aware of her approval.   2nd Care coordination call placed to [employer] in regard to [place] application.  Spoke to [place] who informs the application is currently in progress but due to an IT error on [employer]'s end, the determination has been delayed. She informs IT is aware of the issue and to attempt another outreach in 1-2 business days.  Lisa Gutierrez, [place]  [phone]

## 2021-10-21 ENCOUNTER — Other Ambulatory Visit: Payer: Self-pay | Admitting: Psychiatry

## 2021-10-23 ENCOUNTER — Telehealth: Payer: Self-pay | Admitting: Psychiatry

## 2021-10-23 NOTE — Telephone Encounter (Signed)
Left voicemail for patient requesting call back to schedule follow up appointment in person per provider as it appears the follow-up given at last appointment was not entered.

## 2021-10-25 ENCOUNTER — Other Ambulatory Visit: Payer: Self-pay | Admitting: Orthopedic Surgery

## 2021-10-25 DIAGNOSIS — M25531 Pain in right wrist: Secondary | ICD-10-CM | POA: Diagnosis not present

## 2021-10-25 DIAGNOSIS — M25511 Pain in right shoulder: Secondary | ICD-10-CM | POA: Diagnosis not present

## 2021-10-25 DIAGNOSIS — S46011A Strain of muscle(s) and tendon(s) of the rotator cuff of right shoulder, initial encounter: Secondary | ICD-10-CM | POA: Diagnosis not present

## 2021-10-25 DIAGNOSIS — S52514A Nondisplaced fracture of right radial styloid process, initial encounter for closed fracture: Secondary | ICD-10-CM | POA: Diagnosis not present

## 2021-10-27 ENCOUNTER — Telehealth: Payer: Self-pay | Admitting: Pharmacy Technician

## 2021-10-27 DIAGNOSIS — Z596 Low income: Secondary | ICD-10-CM

## 2021-10-27 NOTE — Progress Notes (Addendum)
[facility])                                            [place] Team    10/27/2021  Lisa Gutierrez [birth year] [id number]  Care coordination call placed to [place] in regard to [place] application.  Spoke to Beverlee Nims who informs patient is APPROVED 10/20/21-04/15/22. She informs first shipment was released on 10/20/21 and should be delivered to the patient's home today 10/27/21 via [employer]PS. Patient will receive an initial shipment of 90 days supply per Beverlee Nims and she will need to call [employer] for refills.  Patient has been notified of approval and when to expect shipment.  Melven Stockard P. Lanis Storlie, [place]  [phone]

## 2021-11-07 ENCOUNTER — Ambulatory Visit
Admission: RE | Admit: 2021-11-07 | Discharge: 2021-11-07 | Disposition: A | Discharge status: Home / Self Care | Payer: PPO | Source: Ambulatory Visit | Attending: Orthopedic Surgery | Admitting: Orthopedic Surgery

## 2021-11-07 DIAGNOSIS — M25511 Pain in right shoulder: Secondary | ICD-10-CM | POA: Diagnosis not present

## 2021-11-07 DIAGNOSIS — S46011A Strain of muscle(s) and tendon(s) of the rotator cuff of right shoulder, initial encounter: Secondary | ICD-10-CM | POA: Insufficient documentation

## 2021-11-09 ENCOUNTER — Other Ambulatory Visit: Payer: Self-pay | Admitting: Nurse Practitioner

## 2021-11-09 DIAGNOSIS — K581 Irritable bowel syndrome with constipation: Secondary | ICD-10-CM

## 2021-11-13 ENCOUNTER — Other Ambulatory Visit: Payer: Self-pay | Admitting: Nurse Practitioner

## 2021-11-15 DIAGNOSIS — S46011A Strain of muscle(s) and tendon(s) of the rotator cuff of right shoulder, initial encounter: Secondary | ICD-10-CM | POA: Diagnosis not present

## 2021-11-20 ENCOUNTER — Other Ambulatory Visit: Payer: Self-pay | Admitting: Psychiatry

## 2021-11-23 ENCOUNTER — Other Ambulatory Visit: Payer: Self-pay

## 2021-11-23 DIAGNOSIS — E1165 Type 2 diabetes mellitus with hyperglycemia: Secondary | ICD-10-CM

## 2021-11-23 MED ORDER — ACCU-CHEK SMARTVIEW VI STRP: 1.0000 | ORAL_STRIP | 11 refills | Status: DC | PRN

## 2021-11-24 ENCOUNTER — Telehealth: Payer: Self-pay | Admitting: Pharmacist

## 2021-11-24 DIAGNOSIS — Z596 Low income: Secondary | ICD-10-CM

## 2021-11-24 NOTE — Progress Notes (Signed)
[place] [facility])                                            [place] Team    11/24/2021  Lisa Gutierrez 1950/10/07 [id number]   Patient self referred for medication assistance with Accu-Chek test strips since we helped her before with other medications.  After investigation of her benefits, it became clear that One Touch is the preferred test strip brand for [employer].    Patient was called HIPAA identifiers were obtained. After explaining the coverage information, patient said she cannot use one touch test strips due to her dexterity issues caused by her recent fall and rotator cuff tear.    Patient was advised that a prior authorization will have to be completed with [employer] to get the Accu-Chek test strips covered.  Once covered, patient would be responsible for 20% of the cost as coinsurance. She communicated understanding.  The provider's office can complete the prior authorization through "cover my meds" as well as by going to the [place] page and navigating to the bottom right corner to find the "General request for Medicare Prescription Drug Coverage Determination" form.  ([email or web address]).  Plan: Route note to provider for the office staff to start the prior authorization process. Follow up in 5-7 business days.  [name], PharmD, [place] Clinical Pharmacist [phone]

## 2021-11-29 NOTE — Progress Notes (Signed)
Virtual Visit via Video Note  I connected with Lisa Gutierrez on 12/04/21 at 11:00 AM EDT by a video enabled telemedicine application and verified that I am speaking with the correct person using two identifiers.  Location: Patient: home Provider: office Persons participated in the visit- patient, provider    I discussed the limitations of evaluation and management by telemedicine and the availability of in person appointments. The patient expressed understanding and agreed to proceed.   I discussed the assessment and treatment plan with the patient. The patient was provided an opportunity to ask questions and all were answered. The patient agreed with the plan and demonstrated an understanding of the instructions.   The patient was advised to call back or seek an in-person evaluation if the symptoms worsen or if the condition fails to improve as anticipated.  I provided 15 minutes of non-face-to-face time during this encounter.   [name], MD    [facility] MD/PA/NP OP Progress Note  12/04/2021 11:32 AM Lisa Gutierrez  MRN:  [id number]  Chief Complaint:  Chief Complaint  Patient presents with   Follow-up   Depression   HPI:  This is a follow-up appointment for depression and anxiety.  She states that she fell and had a rotator cuff injury.  She will have surgery soon.  She states that her husband was the reason for fall.  She tripped when he made her hurry. She states that he commented "you can't even walk" after this fall.  She has insomnia due to pain.  She feels more agitated, nervous and upset easily, although she has been doing better until this fall.  She states that she has been taking BuSpar just once a day lately; she is willing to get back on the dose.  She reports financial strain, and would like to be seen less frequent.  She also requests this writer to contact with her PCP to see if she can take over her medication.  She has occasional panic attacks.  She  denies SI.  She denies alcohol use or drug use. She has decrease in appetite since being on Juardiance. She noticed that she has diaphoresis when she is back on Juardiance; she will recently discuss with her PCP.    Daily routine: takes care of her dogs, cats, house chores Exercise: unable to do due to knee pain Employment:  Support: none (talks with her oldest granddaughter, who has left to college) Household: husband Marital status: married for 59 years  Number of children: 2 sons.      Wt Readings from Last 3 Encounters:  08/24/21 245 lb 1.9 oz (111.2 kg)  07/19/21 234 lb (106.1 kg)  05/03/21 234 lb 14.4 oz (106.5 kg)     Visit Diagnosis:    ICD-10-CM   1. MDD (major depressive disorder), recurrent episode, mild ([employer])  F33.0     2. Anxiety  F41.9       Past Psychiatric History: Please see initial evaluation for full details. I have reviewed the history. No updates at this time.     Past Medical History:  Past Medical History:  Diagnosis Date   Anxiety    Depression    Depression    Phreesia 07/02/2020   Diabetes mellitus without complication (HCC)    GERD (gastroesophageal reflux disease)    Hypertension    IBS (irritable bowel syndrome)    Neuromuscular disorder [facility])     Past Surgical History:  Procedure Laterality Date   BREAST EXCISIONAL BIOPSY  CESAREAN SECTION N/A    Phreesia 07/02/2020   CHOLECYSTECTOMY     EXCISION / BIOPSY BREAST / NIPPLE / DUCT Right 1973   duct removed   SPINE SURGERY N/A    Phreesia 07/02/2020   TUBAL LIGATION N/A    Phreesia 07/02/2020    Family Psychiatric History: Please see initial evaluation for full details. I have reviewed the history. No updates at this time.     Family History:  Family History  Problem Relation Age of Onset   Diabetes Mother    Hypertension Mother    Coronary artery disease Father    Glaucoma Father    Breast cancer Neg Hx     Social History:  Social History   Socioeconomic History    Marital status: Married    Spouse name: Not on file   Number of children: Not on file   Years of education: Not on file   Highest education level: Not on file  Occupational History   Not on file  Tobacco Use   Smoking status: Former   Smokeless tobacco: Never  Substance and Sexual Activity   Alcohol use: No    Alcohol/week: 0.0 standard drinks of alcohol   Drug use: Never   Sexual activity: Not Currently    Partners: Male  Other Topics Concern   Not on file  Social History Narrative   Not on file   Social Determinants of Health   Financial Resource Strain: Not on file  Food Insecurity: Not on file  Transportation Needs: Not on file  Physical Activity: Not on file  Stress: Not on file  Social Connections: Not on file    Allergies:  Allergies  Allergen Reactions   Aspirin Other (See Comments)    GI upset   Ibuprofen Other (See Comments)    GERD   Levofloxacin Other (See Comments)    Weight gain   Meloxicam Other (See Comments)    GI upset   Morphine Other (See Comments)   Naprosyn  [Naproxen] Other (See Comments)    GI upset   Nsaids Other (See Comments)    GI upset   Quinolones    Robinul  [Glycopyrrolate] Nausea And Vomiting   Penicillin V Potassium Rash   Sulfa Antibiotics Rash    Metabolic Disorder Labs: Lab Results  Component Value Date   HGBA1C 7.7 (H) 08/24/2021   No results found for: "PROLACTIN" Lab Results  Component Value Date   CHOL 195 08/24/2021   TRIG 233 (H) 08/24/2021   HDL 31 (L) 08/24/2021   CHOLHDL 6.3 (H) 08/24/2021   LDLCALC 123 (H) 08/24/2021   Lab Results  Component Value Date   TSH 1.040 08/24/2021    Therapeutic Level Labs: No results found for: "LITHIUM" No results found for: "VALPROATE" No results found for: "CBMZ"  Current Medications: Current Outpatient Medications  Medication Sig Dispense Refill   acetaminophen (TYLENOL) 500 MG tablet Take 2 tablets by mouth as needed.     acetic acid-hydrocortisone  (VOSOL-HC) OTIC solution Place 4 drops into the right ear 2 (two) times daily. 10 mL 0   [START ON 12/22/2021] ALPRAZolam (XANAX) 0.5 MG tablet Take 1 tablet (0.5 mg total) by mouth daily as needed for anxiety. 30 tablet 1   Bioflavonoid Products (VITAMIN C) CHEW Chew by mouth. With vitamin D and elderberry     [START ON 12/18/2021] busPIRone (BUSPAR) 5 MG tablet Take 1 tablet (5 mg total) by mouth 2 (two) times daily. 180 tablet 0  Calcium Carbonate (CALCIUM 500 PO) Take by mouth.     Flaxseed, Linseed, (FLAXSEED OIL PO) Take 1,500 mg by mouth 2 (two) times daily.     fluconazole (DIFLUCAN) 150 MG tablet Take 1 tablet by mouth every 3 days. 5 tablet 1   FLUZONE HIGH-DOSE QUADRIVALENT 0.7 ML SUSY      glucose blood (ACCU-CHEK SMARTVIEW) test strip 1 each by Other route 2 (two) times daily as needed for other. Use as instructed 200 strip 3   JARDIANCE 10 MG TABS tablet TAKE 1 TABLET BY MOUTH EVERY DAY BEFORE BREAKFAST 30 tablet 1   LINZESS 145 MCG CAPS capsule TAKE 1 CAPSULE BY MOUTH EVERY DAY 30 capsule 2   meclizine (ANTIVERT) 25 MG tablet Take 1 tablet (25 mg total) by mouth 2 (two) times daily as needed for dizziness. 60 tablet 1   metoprolol succinate (TOPROL-XL) 25 MG 24 hr tablet TAKE 2 TABLETS BY MOUTH EVERY DAY 180 tablet 1   Multiple Vitamins-Minerals (MULTIVITAMIN ADULT PO) Take 1 tablet by mouth daily.     nystatin (MYCOSTATIN/NYSTOP) powder APPLY TO AFFECTED AREA TWICE A DAY 60 g 3   nystatin ointment (MYCOSTATIN) Apply 1 application topically 2 (two) times daily. 30 g 1   pantoprazole (PROTONIX) 40 MG tablet TAKE 1 TABLET BY MOUTH TWICE A DAY 180 tablet 1   sitaGLIPtin-metformin (JANUMET) 50-500 MG tablet Take 1 tablet by mouth 2 (two) times daily with a meal. 180 tablet 3   telmisartan-hydrochlorothiazide (MICARDIS HCT) 80-25 MG tablet TAKE 1 TABLET BY MOUTH EVERY DAY 90 tablet 1   [START ON 01/20/2022] venlafaxine XR (EFFEXOR-XR) 150 MG 24 hr capsule Take 1 capsule (150 mg total) by  mouth daily with breakfast. 90 capsule 0   No current facility-administered medications for this visit.     Musculoskeletal: Strength & Muscle Tone: within normal limits Gait & Station: normal Patient leans: N/A  Psychiatric Specialty Exam: Review of Systems  Psychiatric/Behavioral:  Positive for sleep disturbance. Negative for agitation, behavioral problems, confusion, decreased concentration, dysphoric mood, hallucinations, self-injury and suicidal ideas. The patient is nervous/anxious. The patient is not hyperactive.   All other systems reviewed and are negative.   There were no vitals taken for this visit.There is no height or weight on file to calculate BMI.  General Appearance: Fairly Groomed  Eye Contact:  Good  Speech:  Clear and Coherent  Volume:  Normal  Mood:  Anxious  Affect:  Appropriate, Congruent, and calm  Thought Process:  Coherent  Orientation:  Full (Time, Place, and Person)  Thought Content: Logical   Suicidal Thoughts:  No  Homicidal Thoughts:  No  Memory:  Immediate;   Good  Judgement:  Good  Insight:  Good  Psychomotor Activity:  Normal  Concentration:  Concentration: Good and Attention Span: Good  Recall:  Good  Fund of Knowledge: Good  Language: Good  Akathisia:  No  Handed:  Right  AIMS (if indicated): not done  Assets:  Communication Skills Desire for Improvement  ADL's:  Intact  Cognition: WNL  Sleep:  Poor   Screenings: GAD-7    Flowsheet Row Clinical Support from 08/24/2021 in [place] at [facility] Video Visit from 07/19/2021 in [place] at [facility] Video Visit from 05/03/2021 in [place] at [facility] Visit from 02/23/2021 in [place] at [facility] Visit from 12/09/2020 in [place] at [facility]  Total GAD-7 Score 8  $'15 7 6 17      'C$ Mini-Mental    Flowsheet Row Clinical Support from 02/12/2020 in [facility] [place] from 02/06/2018 in [facility]  Total Score (max 30 points ) 29 30      PHQ2-9    Flowsheet Row Clinical Support from 08/24/2021 in [place] at [facility] Video Visit from 07/19/2021 in [place] at [facility] Video Visit from 05/03/2021 in [place] at [facility] Visit from 02/23/2021 in [place] at [place] from 01/18/2021 in [place]  PHQ-2 Total Score 0 '1 1 1 5  '$ PHQ-9 Total Score '13 7 7 5 21      '$ Flowsheet Row Counselor from 09/13/2021 in [place] Counselor from 07/24/2021 in [place] Counselor from 03/20/2021 in [place] No Risk Low Risk Low Risk        Assessment and Plan:  Caelynn Marshman is a 71 y.o. year old female with a history of  depression, hypertension, diabetes, GERD, who presents for follow up appointment for below.    1. MDD (major depressive disorder), recurrent episode, mild ([place]) 2. Anxiety R/o PTSD She reports slight worsening in irritability and anxiety in the context of recent fall/rotator cuff injury in her right shoulder, which also coincided with self tapering down BuSpar since the last visit.  Other psychosocial stressors includes marital conflict, conflict with her children, financial strain.  She was advised to uptitrate BuSpar to the original dose to target anxiety.  Will continue venlafaxine to target depression and anxiety; will consider uptitration if she has limited benefit from BuSpar.  Will continue Xanax as needed for anxiety.  She will greatly benefit from CBT; she will contact the office to make a follow-up appointment.     Plan Continue venlafaxine 150 mg daily- monitor weight gain Increase BuSpar 5 mg twice a day- monitor drowsiness Continue Xanax 0.5 mg daily as needed for anxiety Next  appointment : 10/31 at 11 AM for 30 mins, in person-she declined a sooner visit due to financial strain.  She also requests this writer to contact with her PCP to inquire whether she can take over the prescription.  Message was sent to the provider about this.      Past trials of medication: sertraline, citalopram, lexapro, fluoxetine, bupropion (headache)   The patient demonstrates the following risk factors for suicide: Chronic risk factors for suicide include: psychiatric disorder of depression and chronic pain. Acute risk factors for suicide include: family or marital conflict. Protective factors for this patient include: hope for the future. Considering these factors, the overall suicide risk at this point appears to be low. Patient is appropriate for outpatient follow up.    I have utilized the [place] Controlled Substances Reporting System (PMP AWARxE) to confirm adherence regarding the patient's medication. My review reveals appropriate prescription fills.   This clinician has discussed the side effect associated with medication prescribed during this encounter. Please refer to notes in the previous encounters for more details.      Collaboration of Care: Collaboration of Care: Other communicate with her PCP per request from the patient Patient/Guardian was advised Release of Information must be obtained prior to any record release in order to collaborate their care with an outside provider. Patient/Guardian was advised if they have not already done so to contact the registration  department to sign all necessary forms in order for [place] to release information regarding their care.   Consent: Patient/Guardian gives verbal consent for treatment and assignment of benefits for services provided during this visit. Patient/Guardian expressed understanding and agreed to proceed.    [name], MD 12/04/2021, 11:32 AM

## 2021-11-30 ENCOUNTER — Encounter: Payer: Self-pay | Admitting: Nurse Practitioner

## 2021-12-01 ENCOUNTER — Telehealth: Payer: Self-pay | Admitting: Nurse Practitioner

## 2021-12-01 ENCOUNTER — Other Ambulatory Visit: Payer: Self-pay | Admitting: Nurse Practitioner

## 2021-12-01 DIAGNOSIS — E1165 Type 2 diabetes mellitus with hyperglycemia: Secondary | ICD-10-CM

## 2021-12-01 MED ORDER — ACCU-CHEK SMARTVIEW VI STRP: 1.0000 | ORAL_STRIP | Freq: Two times a day (BID) | 3 refills | Status: DC | PRN

## 2021-12-01 NOTE — Progress Notes (Signed)
Will fax new prescription for Accucheck smart wiew strips to [employer].

## 2021-12-01 NOTE — Telephone Encounter (Signed)
Patient has left a vm asking for you to return her call she needs to ask you a question. Just at your convenience will you call her?

## 2021-12-02 DIAGNOSIS — E13 Other specified diabetes mellitus: Secondary | ICD-10-CM | POA: Diagnosis not present

## 2021-12-04 ENCOUNTER — Telehealth (INDEPENDENT_AMBULATORY_CARE_PROVIDER_SITE_OTHER): Payer: PPO | Admitting: Psychiatry

## 2021-12-04 ENCOUNTER — Encounter: Payer: Self-pay | Admitting: Psychiatry

## 2021-12-04 DIAGNOSIS — F419 Anxiety disorder, unspecified: Secondary | ICD-10-CM

## 2021-12-04 DIAGNOSIS — F330 Major depressive disorder, recurrent, mild: Secondary | ICD-10-CM

## 2021-12-04 MED ORDER — BUSPIRONE HCL 5 MG PO TABS: 5.0000 mg | ORAL_TABLET | Freq: Two times a day (BID) | ORAL | 0 refills | Status: DC

## 2021-12-04 MED ORDER — ALPRAZOLAM 0.5 MG PO TABS: 0.5000 mg | ORAL_TABLET | Freq: Every day | ORAL | 1 refills | Status: DC | PRN

## 2021-12-04 MED ORDER — VENLAFAXINE HCL ER 150 MG PO CP24: 150.0000 mg | ORAL_CAPSULE | Freq: Every day | ORAL | 0 refills | Status: DC

## 2021-12-05 ENCOUNTER — Encounter: Payer: Self-pay | Admitting: *Deleted

## 2021-12-05 ENCOUNTER — Telehealth: Payer: Self-pay | Admitting: Pharmacist

## 2021-12-05 NOTE — Progress Notes (Signed)
[facility])                                            [place] Team    12/05/2021  Lisa Gutierrez [birth year] [id number]   Spoke with patient again today about her Accu-Chek test strips. HIPAA identifiers were obtained.  Patient reported paying $80 for her test strips with [facility].  She said she was given prior authorization from [employer] to get her Accu-Chek test strips for $0.00    [employer] was called.  We spoke with a concierge named, [name].  [name] said she could see the authorization and that for the patient to have her strips covered, a prescription needed to be sent to [facility] [place] located at [place],  [address] their fax number is [phone].  Once the prescription is sent to [facility], the process can begin for the testing supplies to be mailed to the patient.  Since Accu-Chek supplies are non-formulary, they will not process through her prescription coverage.  Accu-Chek has to be processed through her Part B (Medical) plan and billed through a [place] (DME) company like [employer].   [employer] reported the patient's price as 20% of the cost not $0.00  Patient will have to work on getting her refund for the test strips she paid for with [employer].  Plan: Route note requesting a script be sent to [facility] to both [name], NP and [name], RMA.  [name], PharmD, [place] Clinical Pharmacist [phone]

## 2021-12-05 NOTE — Progress Notes (Signed)
[facility] Quality Team Note  Name: Lisa Gutierrez Date of Birth: [birth year] MRN: [id number] Date: 12/05/2021  [facility] Quality Team has reviewed this patient's chart, please see recommendations below:  Dexa Scan Scheduling;  [facility] Quality Other; (Pt had fracture on 10/25/2021. Pt was seen by Dr Hessie Knows at [facility] on 10/25/2021. X-rays were completed, was found to have traumatic complete tear of right rotator cuff (thought to be old) & closed nondisplaced fracture of styloid process of right radius.Last Dexa scan was 2015.  Due to new fracture pt will need to have DEXA ordered & completed within the near future to close her OMW metric GAP. Her OMW deadline is 04/23/2022, if Dexa is not completed at least by this date she will not meet the measure. )

## 2021-12-19 DIAGNOSIS — D485 Neoplasm of uncertain behavior of skin: Secondary | ICD-10-CM | POA: Diagnosis not present

## 2021-12-19 DIAGNOSIS — L0101 Non-bullous impetigo: Secondary | ICD-10-CM | POA: Diagnosis not present

## 2021-12-19 DIAGNOSIS — B078 Other viral warts: Secondary | ICD-10-CM | POA: Diagnosis not present

## 2021-12-25 DIAGNOSIS — S46011D Strain of muscle(s) and tendon(s) of the rotator cuff of right shoulder, subsequent encounter: Secondary | ICD-10-CM | POA: Diagnosis not present

## 2021-12-26 ENCOUNTER — Ambulatory Visit (INDEPENDENT_AMBULATORY_CARE_PROVIDER_SITE_OTHER): Payer: PPO | Admitting: Licensed Clinical Social Worker

## 2021-12-26 ENCOUNTER — Telehealth (HOSPITAL_COMMUNITY): Payer: Self-pay | Admitting: Licensed Clinical Social Worker

## 2021-12-26 DIAGNOSIS — F330 Major depressive disorder, recurrent, mild: Secondary | ICD-10-CM | POA: Diagnosis not present

## 2021-12-26 DIAGNOSIS — F419 Anxiety disorder, unspecified: Secondary | ICD-10-CM

## 2021-12-26 NOTE — Telephone Encounter (Signed)
Spoke w/ pt about upcoming appointment--pt requests to be placed on call list for virtual appt if there is a cancellation. Pt tearful on phone call--assured her that an appt will open up prior to her scheduled appt in Oct.

## 2021-12-26 NOTE — Progress Notes (Signed)
Virtual Visit via Audio Note  I connected with Lisa Gutierrez on 12/26/21 at  2:00 PM EDT by an audio enabled telemedicine application and verified that I am speaking with the correct person using two identifiers.  Location: Patient: home Provider: remote office [place], [place])   I discussed the limitations of evaluation and management by telemedicine and the availability of in person appointments. The patient expressed understanding and agreed to proceed.   I discussed the assessment and treatment plan with the patient. The patient was provided an opportunity to ask questions and all were answered. The patient agreed with the plan and demonstrated an understanding of the instructions.   The patient was advised to call back or seek an in-person evaluation if the symptoms worsen or if the condition fails to improve as anticipated.  I provided 45 minutes of non-face-to-face time during this encounter.   Conrado Nance R Nicodemus Denk, LCSW   THERAPIST PROGRESS NOTE  Session Time: 2:15-3p  Participation Level: Active  Behavioral Response: NAAlertDepressed and Dysphoric  Type of Therapy: Individual Therapy  Treatment Goals addressed:   ProgressTowards Goals: Not Progressing  Interventions: CBT and Supportive (see above)  Summary: Lisa Gutierrez is a 71 y.o. female who presents with continuing symptoms related to depression. Pt reports that she is feeling sad, depressed, overly emotional, rejected, and fearful that she will need shoulder surgery.   Allowed pt to explore and express thoughts and feelings associated with recent life situations and external stressors. Patient recalled a recent situation where she sustained a shoulder injury while falling going upstairs. Patient fearful of having surgery on that shoulder due to trauma associated with the previous negative reaction to anesthesia during a surgical procedure. As patient recalls this incident, she reports significant  anxiety and panic that is triggered. Because of this, patient is extremely fearful of any potential surgeries that may be needed. Patient reports that she has a good relationship with her physical therapist, and they are going to try some physical therapy interventions before making the decision to do surgery.  Allow patient to explore relationships with family members, and overall psychological impact of those relationships. Patient continues to feel rejected by her sons and her husband. Patient reports that she also feels that her grandchildren do not respond to her in the way that she would like for them to. Patient reports that often weeks and months go by without any connection or communication with her grandchildren. Encouraged patient to be the one to reach out initially to her family members if this is something that she is seeking.   discussed medication management of depression symptoms--patient reports that she is compliant with her psychiatric visits and is compliant with her medication. Patient does not feel that the medication is managing her symptoms as well as she thinks that it should. Encouraged patient to discuss this with her psychiatrist at her next appointment. Patient is understanding and cooperative.   Discussed wellness/self care. Pt admits that physical activity is very hard for her to do.   Continued recommendations are as follows: self care behaviors, positive social engagements, focusing on overall work/home/life balance, and focusing on positive physical and emotional wellness.   Suicidal/Homicidal: No  Therapist Response: Pt is continuing to apply interventions learned in session into daily life situations. Pt is currently on track to meet goals utilizing interventions mentioned above. Personal growth and progress noted. Treatment to continue as indicated.   Plan: Return again 4 weeks or PRN.  Diagnosis:  Encounter Diagnoses  Name Primary?   MDD (major depressive  disorder), recurrent episode, mild ([place]) Yes   Anxiety    Collaboration of Care: Other pt encouraged to continue with psychiatrist of record, Dr. Modesta Messing  Patient/Guardian was advised Release of Information must be obtained prior to any record release in order to collaborate their care with an outside provider. Patient/Guardian was advised if they have not already done so to contact the registration department to sign all necessary forms in order for [place] to release information regarding their care.   Consent: Patient/Guardian gives verbal consent for treatment and assignment of benefits for services provided during this visit. Patient/Guardian expressed understanding and agreed to proceed.   [place], LCSW 12/26/2021

## 2021-12-27 ENCOUNTER — Encounter (HOSPITAL_COMMUNITY): Payer: Self-pay

## 2021-12-27 NOTE — Plan of Care (Signed)
  Problem: Decrease depressive symptoms and improve levels of effective functioning Goal: LTG: Reduce frequency, intensity, and duration of depression symptoms as evidenced by: SSB input needed on appropriate metric Outcome: Not Progressing Note: Pt reporting increase in depression symptoms Goal: STG: '@PREFFIRSTNAME'$ @ will participate in at least 80% of scheduled individual psychotherapy sessions Outcome: Not Progressing Note: Last appt was 4 months ago Intervention: Encourage verbalization of feelings/concerns/expectations Note: Allowed pt to explore/express Intervention: Provide emotional support Note: Provided/supported Intervention: Encourage patient to identify triggers Note: Assisted with identification  Intervention: Manage signs of labile or escalating emotions Note: Reviewed emotion regulation

## 2021-12-28 ENCOUNTER — Telehealth: Payer: PPO | Admitting: Nurse Practitioner

## 2021-12-31 NOTE — Progress Notes (Unsigned)
Virtual Visit via Telephone Note  I connected with Lisa Gutierrez on 01/01/22 at  1:50 PM EDT by telephone and verified that I am speaking with the correct person using two identifiers.  Location: Patient: home  Provider: [place] primary care at [facility]     I discussed the limitations, risks, security and privacy concerns of performing an evaluation and management service by telephone and the availability of in person appointments. I also discussed with the patient that there may be a patient responsible charge related to this service. The patient expressed understanding and agreed to proceed.   History of Present Illness: Discuss medication -diabetes - now taking jardiance which is now free.  -anxiety - patient is seeing psychiatrist and counselor. She states that seeing both the psychiatrist and counselor is getting to be very expensive for her. She is taking alprazolam 0.5 mg. This is prescribed twice daily as needed. She takes this mostly at night. Will save the second dose for only if needed for severe anxiety. She is asking that I take over prescriptions for severe anxiety. She plans to keep her appointments with counselor.  Reviewed her PDMP profile today. Her overdose risk score is 110 with no red flags or state indicators.  -hypertension  -chronic constipation - taking linzess which is also now free.  -has seen dermatologist and diagnosed with pergeo nodularis. Was recommended to start methotrexate. She is concerned about this medication.  -fell onto her right shoulder. Has seen orthopedics. Has two large and two small tears of rotator cuff. She did get some cortisone injections and has done some physical therapy. She will go back to orthopedics on Thursday this will help determine if she has surgery or not.     Observations/Objective:  The patient is alert and oriented. She is pleasant and answers all questions appropriately. Breathing is non-labored. She is in no  acute distress at this time.  She sounds anxious and worried about everything she is going through.   Today's Vitals   01/01/22 1304  Weight: 245 lb (111.1 kg)  Height: 5' 6.14" (1.68 m)   Body mass index is 39.38 kg/m.    Assessment and Plan: 1. Generalized anxiety disorder Will take over prescriptions for GAD and depression. Change alprazolam 0.5 mg tablets to twice daily as needed for acute anxiety. New prescription for #45 tablets sent with two refills. She has agreed to continue with counseling appointments.  - ALPRAZolam (XANAX) 0.5 MG tablet; Take 1 tablet (0.5 mg total) by mouth 2 (two) times daily as needed for anxiety.  Dispense: 45 tablet; Refill: 2  2. MDD (major depressive disorder), recurrent, in partial remission ([place]) Continue effexor as prescribed. Will refill as indicated. Patient to continue to see counseling services as scheduled.   3. Uncontrolled type 2 diabetes mellitus with hyperglycemia ([place]) Continue diabetic medications as prescribed   4. Prurigo nodularis Diagnosis per podiatry. Recommendation from podiatry is to start methotrexate. I have encouraged patient to do trial with this medication and discuss negative side effects with podiatry.   5. Acute pain of right shoulder Patient with multiple tears in rotator cuff area. Has gone through 8 weeks of physical therapy. Will follow up this Thursday.    Follow Up Instructions:    I discussed the assessment and treatment plan with the patient. The patient was provided an opportunity to ask questions and all were answered. The patient agreed with the plan and demonstrated an understanding of the instructions.   The patient  was advised to call back or seek an in-person evaluation if the symptoms worsen or if the condition fails to improve as anticipated.  I provided 25 minutes of non-face-to-face time during this encounter.   Ronnell Freshwater, NP

## 2022-01-01 ENCOUNTER — Encounter: Payer: Self-pay | Admitting: Nurse Practitioner

## 2022-01-01 ENCOUNTER — Telehealth (INDEPENDENT_AMBULATORY_CARE_PROVIDER_SITE_OTHER): Payer: PPO | Admitting: Nurse Practitioner

## 2022-01-01 VITALS — Ht 66.14 in | Wt 245.0 lb

## 2022-01-01 DIAGNOSIS — M25511 Pain in right shoulder: Secondary | ICD-10-CM

## 2022-01-01 DIAGNOSIS — E1165 Type 2 diabetes mellitus with hyperglycemia: Secondary | ICD-10-CM

## 2022-01-01 DIAGNOSIS — L281 Prurigo nodularis: Secondary | ICD-10-CM

## 2022-01-01 DIAGNOSIS — F3341 Major depressive disorder, recurrent, in partial remission: Secondary | ICD-10-CM

## 2022-01-01 DIAGNOSIS — F411 Generalized anxiety disorder: Secondary | ICD-10-CM

## 2022-01-01 MED ORDER — ALPRAZOLAM 0.5 MG PO TABS: 0.5000 mg | ORAL_TABLET | Freq: Two times a day (BID) | ORAL | 2 refills | Status: AC | PRN

## 2022-01-04 DIAGNOSIS — S46011D Strain of muscle(s) and tendon(s) of the rotator cuff of right shoulder, subsequent encounter: Secondary | ICD-10-CM | POA: Diagnosis not present

## 2022-01-08 DIAGNOSIS — M25511 Pain in right shoulder: Secondary | ICD-10-CM | POA: Diagnosis not present

## 2022-01-08 DIAGNOSIS — S46011D Strain of muscle(s) and tendon(s) of the rotator cuff of right shoulder, subsequent encounter: Secondary | ICD-10-CM | POA: Diagnosis not present

## 2022-01-10 ENCOUNTER — Telehealth: Payer: Self-pay

## 2022-01-10 NOTE — Telephone Encounter (Signed)
Patient called office and thinks that she may have an UTI or bladder, patient wanted to know if she could get orders sent to [facility] in [place], please advise, thanks!

## 2022-01-11 ENCOUNTER — Other Ambulatory Visit: Payer: Self-pay | Admitting: Nurse Practitioner

## 2022-01-11 DIAGNOSIS — R300 Dysuria: Secondary | ICD-10-CM

## 2022-01-11 NOTE — Telephone Encounter (Signed)
Called pt LVM to contact the office °

## 2022-01-11 NOTE — Telephone Encounter (Signed)
Please let her know that I have placed that order at [facility] to run a urinalysis. We will treat for abnrmalities once results have come back.  Thanks  -HB

## 2022-01-11 NOTE — Telephone Encounter (Signed)
Pt is advised of labs that were placed for U/A  and recommendation

## 2022-01-11 NOTE — Progress Notes (Signed)
Order placed for urinalysis with reflex t microscopic analysis if positive for abnormalities

## 2022-01-17 DIAGNOSIS — S46011D Strain of muscle(s) and tendon(s) of the rotator cuff of right shoulder, subsequent encounter: Secondary | ICD-10-CM | POA: Diagnosis not present

## 2022-01-19 DIAGNOSIS — R300 Dysuria: Secondary | ICD-10-CM | POA: Diagnosis not present

## 2022-01-20 LAB — MICROSCOPIC EXAMINATION
Bacteria, UA: NONE SEEN
Casts: NONE SEEN /lpf

## 2022-01-20 LAB — URINALYSIS, ROUTINE W REFLEX MICROSCOPIC
Bilirubin, UA: NEGATIVE
Nitrite, UA: NEGATIVE
Protein,UA: NEGATIVE
RBC, UA: NEGATIVE
Specific Gravity, UA: >=1.03 — AB (ref 1.005–1.030)
Urobilinogen, Ur: 0.2 mg/dL (ref 0.2–1.0)
pH, UA: 5.5 (ref 5.0–7.5)

## 2022-01-24 DIAGNOSIS — S46011D Strain of muscle(s) and tendon(s) of the rotator cuff of right shoulder, subsequent encounter: Secondary | ICD-10-CM | POA: Diagnosis not present

## 2022-02-02 ENCOUNTER — Ambulatory Visit (INDEPENDENT_AMBULATORY_CARE_PROVIDER_SITE_OTHER): Payer: PPO | Admitting: Licensed Clinical Social Worker

## 2022-02-02 DIAGNOSIS — F419 Anxiety disorder, unspecified: Secondary | ICD-10-CM

## 2022-02-02 DIAGNOSIS — F330 Major depressive disorder, recurrent, mild: Secondary | ICD-10-CM
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
## 2022-02-05 DIAGNOSIS — S46011D Strain of muscle(s) and tendon(s) of the rotator cuff of right shoulder, subsequent encounter: Secondary | ICD-10-CM | POA: Diagnosis not present

## 2022-02-05 NOTE — Plan of Care (Signed)
  Problem: Decrease depressive symptoms and improve levels of effective functioning Goal: LTG: Reduce frequency, intensity, and duration of depression symptoms as evidenced by: SSB input needed on appropriate metric Outcome: Progressing Goal: STG: '@PREFFIRSTNAME'$ @ will participate in at least 80% of scheduled individual psychotherapy sessions Outcome: Progressing Intervention: Encourage verbalization of feelings/concerns/expectations Note: Allowed pt to explore/express--explored current coping skills

## 2022-02-08 ENCOUNTER — Telehealth: Payer: Self-pay | Admitting: Pharmacist

## 2022-02-08 NOTE — Progress Notes (Signed)
[facility]) Care Management  [place]   02/08/2022  Suda Forbess 1950/07/26 [id number]  Reason for referral: Medication Assistance re-enrollment  Referral source: [place] Team Referral medication(s): Linzess and Jardiance Current insurance:[employer]   Medication Assistance Findings:  Medication assistance needs identified: Spoke with patient. HIPAA identifiers were obtained.  Patient confirmed she is still taking Linzess and Jardiance and is still in need of medication assistance.  She appears to still qualify for 2024.   Additional medication assistance options reviewed with patient as warranted:  No other options identified  Plan: I will route patient assistance letter to [place] technician who will coordinate patient assistance program application process for medications listed above.  [facility] pharmacy technician will assist with obtaining all required documents from both patient and provider(s) and submit application(s) once completed.    [name], PharmD, [place] Clinical Pharmacist [phone]

## 2022-02-09 ENCOUNTER — Other Ambulatory Visit: Payer: Self-pay | Admitting: Nurse Practitioner

## 2022-02-09 DIAGNOSIS — I10 Essential (primary) hypertension: Secondary | ICD-10-CM

## 2022-02-11 NOTE — Progress Notes (Deleted)
[place] MD/PA/NP OP Progress Note  02/11/2022 2:20 PM Lisa Gutierrez  MRN:  [id number]  Chief Complaint: No chief complaint on file.  HPI: *** Visit Diagnosis: No diagnosis found.  Past Psychiatric History: Please see initial evaluation for full details. I have reviewed the history. No updates at this time.     Past Medical History:  Past Medical History:  Diagnosis Date   Anxiety    Depression    Depression    Phreesia 07/02/2020   Diabetes mellitus without complication (HCC)    GERD (gastroesophageal reflux disease)    Hypertension    IBS (irritable bowel syndrome)    Neuromuscular disorder [facility])     Past Surgical History:  Procedure Laterality Date   BREAST EXCISIONAL BIOPSY     CESAREAN SECTION N/A    Phreesia 07/02/2020   CHOLECYSTECTOMY     EXCISION / BIOPSY BREAST / NIPPLE / DUCT Right 1973   duct removed   SPINE SURGERY N/A    Phreesia 07/02/2020   TUBAL LIGATION N/A    Phreesia 07/02/2020    Family Psychiatric History: Please see initial evaluation for full details. I have reviewed the history. No updates at this time.     Family History:  Family History  Problem Relation Age of Onset   Diabetes Mother    Hypertension Mother    Coronary artery disease Father    Glaucoma Father    Breast cancer Neg Hx     Social History:  Social History   Socioeconomic History   Marital status: Married    Spouse name: Not on file   Number of children: Not on file   Years of education: Not on file   Highest education level: Not on file  Occupational History   Not on file  Tobacco Use   Smoking status: Former   Smokeless tobacco: Never  Substance and Sexual Activity   Alcohol use: No    Alcohol/week: 0.0 standard drinks of alcohol   Drug use: Never   Sexual activity: Not Currently    Partners: Male  Other Topics Concern   Not on file  Social History Narrative   Not on file   Social Determinants of Health   Financial Resource Strain: Not on file   Food Insecurity: Not on file  Transportation Needs: Not on file  Physical Activity: Not on file  Stress: Not on file  Social Connections: Not on file    Allergies:  Allergies  Allergen Reactions   Aspirin Other (See Comments)    GI upset   Ibuprofen Other (See Comments)    GERD   Levofloxacin Other (See Comments)    Weight gain   Meloxicam Other (See Comments)    GI upset   Morphine Other (See Comments)   Naprosyn  [Naproxen] Other (See Comments)    GI upset   Nsaids Other (See Comments)    GI upset   Quinolones    Robinul  [Glycopyrrolate] Nausea And Vomiting   Penicillin V Potassium Rash   Sulfa Antibiotics Rash    Metabolic Disorder Labs: Lab Results  Component Value Date   HGBA1C 7.7 (H) 08/24/2021   No results found for: "PROLACTIN" Lab Results  Component Value Date   CHOL 195 08/24/2021   TRIG 233 (H) 08/24/2021   HDL 31 (L) 08/24/2021   CHOLHDL 6.3 (H) 08/24/2021   LDLCALC 123 (H) 08/24/2021   Lab Results  Component Value Date   TSH 1.040 08/24/2021    Therapeutic Level  Labs: No results found for: "LITHIUM" No results found for: "VALPROATE" No results found for: "CBMZ"  Current Medications: Current Outpatient Medications  Medication Sig Dispense Refill   acetaminophen (TYLENOL) 500 MG tablet Take 2 tablets by mouth as needed.     acetic acid-hydrocortisone (VOSOL-HC) OTIC solution Place 4 drops into the right ear 2 (two) times daily. 10 mL 0   ALPRAZolam (XANAX) 0.5 MG tablet Take 1 tablet (0.5 mg total) by mouth 2 (two) times daily as needed for anxiety. 45 tablet 2   Bioflavonoid Products (VITAMIN C) CHEW Chew by mouth. With vitamin D and elderberry     busPIRone (BUSPAR) 5 MG tablet Take 1 tablet (5 mg total) by mouth 2 (two) times daily. 180 tablet 0   Calcium Carbonate (CALCIUM 500 PO) Take by mouth.     Flaxseed, Linseed, (FLAXSEED OIL PO) Take 1,500 mg by mouth 2 (two) times daily.     fluconazole (DIFLUCAN) 150 MG tablet Take 1 tablet by  mouth every 3 days. 5 tablet 1   FLUZONE HIGH-DOSE QUADRIVALENT 0.7 ML SUSY      glucose blood (ACCU-CHEK SMARTVIEW) test strip 1 each by Other route 2 (two) times daily as needed for other. Use as instructed 200 strip 3   JARDIANCE 10 MG TABS tablet TAKE 1 TABLET BY MOUTH EVERY DAY BEFORE BREAKFAST 30 tablet 1   LINZESS 145 MCG CAPS capsule TAKE 1 CAPSULE BY MOUTH EVERY DAY 30 capsule 2   meclizine (ANTIVERT) 25 MG tablet Take 1 tablet (25 mg total) by mouth 2 (two) times daily as needed for dizziness. 60 tablet 1   metoprolol succinate (TOPROL-XL) 25 MG 24 hr tablet TAKE 2 TABLETS BY MOUTH EVERY DAY 180 tablet 1   Multiple Vitamins-Minerals (MULTIVITAMIN ADULT PO) Take 1 tablet by mouth daily.     nystatin (MYCOSTATIN/NYSTOP) powder APPLY TO AFFECTED AREA TWICE A DAY 60 g 3   nystatin ointment (MYCOSTATIN) Apply 1 application topically 2 (two) times daily. 30 g 1   pantoprazole (PROTONIX) 40 MG tablet TAKE 1 TABLET BY MOUTH TWICE A DAY 180 tablet 1   sitaGLIPtin-metformin (JANUMET) 50-500 MG tablet Take 1 tablet by mouth 2 (two) times daily with a meal. 180 tablet 3   telmisartan-hydrochlorothiazide (MICARDIS HCT) 80-25 MG tablet TAKE 1 TABLET BY MOUTH EVERY DAY 90 tablet 1   venlafaxine XR (EFFEXOR-XR) 150 MG 24 hr capsule Take 1 capsule (150 mg total) by mouth daily with breakfast. 90 capsule 0   No current facility-administered medications for this visit.     Musculoskeletal: Strength & Muscle Tone: within normal limits Gait & Station: normal Patient leans: N/A  Psychiatric Specialty Exam: Review of Systems  There were no vitals taken for this visit.There is no height or weight on file to calculate BMI.  General Appearance: {Appearance:22683}  Eye Contact:  {BHH EYE CONTACT:22684}  Speech:  Clear and Coherent  Volume:  Normal  Mood:  {BHH MOOD:22306}  Affect:  {Affect (PAA):22687}  Thought Process:  Coherent  Orientation:  Full (Time, Place, and Person)  Thought Content:  Logical   Suicidal Thoughts:  {ST/HT (PAA):22692}  Homicidal Thoughts:  {ST/HT (PAA):22692}  Memory:  Immediate;   Good  Judgement:  {Judgement (PAA):22694}  Insight:  {Insight (PAA):22695}  Psychomotor Activity:  Normal  Concentration:  Concentration: Good and Attention Span: Good  Recall:  Good  Fund of Knowledge: Good  Language: Good  Akathisia:  No  Handed:  Right  AIMS (if indicated): not done  Assets:  Communication Skills Desire for Improvement  ADL's:  Intact  Cognition: WNL  Sleep:  {BHH GOOD/FAIR/POOR:22877}   Screenings: GAD-7    Flowsheet Row Video Visit from 01/01/2022 in [place] at [place] from 08/24/2021 in [place] at [facility] Video Visit from 07/19/2021 in [place] at [facility] Video Visit from 05/03/2021 in [place] at [facility] Visit from 02/23/2021 in [place] at [facility]  Total GAD-7 Score '13 8 15 7 6      '$ [place] from 02/12/2020 in [facility], [place] from 02/06/2018 in [facility], [facility]  Total Score (max 30 points ) 29 30      PHQ2-9    Flowsheet Row Video Visit from 01/01/2022 in [place] at [place] from 12/26/2021 in [place] from 08/24/2021 in [place] at [facility] Video Visit from 07/19/2021 in [place] at [facility] Video Visit from 05/03/2021 in [place] at [facility] Total Score 2 6 0 1 1  PHQ-9 Total Score '16 23 13 7 7      '$ Flowsheet Row Counselor from 12/26/2021 in [place] Counselor from 09/13/2021 in [place] Counselor from 07/24/2021 in [place] No Risk No Risk Low Risk        Assessment  and Plan:  Doree Kuehne is a 71 y.o. year old female with a history of  depression, hypertension, diabetes, GERD, who presents for follow up appointment for below.    1. MDD (major depressive disorder), recurrent episode, mild ([place]) 2. Anxiety R/o PTSD She reports slight worsening in irritability and anxiety in the context of recent fall/rotator cuff injury in her right shoulder, which also coincided with self tapering down BuSpar since the last visit.  Other psychosocial stressors includes marital conflict, conflict with her children, financial strain.  She was advised to uptitrate BuSpar to the original dose to target anxiety.  Will continue venlafaxine to target depression and anxiety; will consider uptitration if she has limited benefit from BuSpar.  Will continue Xanax as needed for anxiety.  She will greatly benefit from CBT; she will contact the office to make a follow-up appointment.      Plan Continue venlafaxine 150 mg daily- monitor weight gain Increase BuSpar 5 mg twice a day- monitor drowsiness Continue Xanax 0.5 mg daily as needed for anxiety Next appointment : 10/31 at 11 AM for 30 mins, in person-she declined a sooner visit due to financial strain.  She also requests this writer to contact with her PCP to inquire whether she can take over the prescription.  Message was sent to the provider about this.      Past trials of medication: sertraline, citalopram, lexapro, fluoxetine, bupropion (headache)   The patient demonstrates the following risk factors for suicide: Chronic risk factors for suicide include: psychiatric disorder of depression and chronic pain. Acute risk factors for suicide include: family or marital conflict. Protective factors for this patient include: hope for the future. Considering these factors, the overall suicide risk at this point appears to be low. Patient is appropriate for outpatient follow up.       Collaboration of Care: Collaboration of  Care: {BH OP Collaboration of [employer]  Patient/Guardian was advised Release of Information must be obtained prior to any record release in order to collaborate their care with an outside provider. Patient/Guardian was advised if they have not already done so to contact the registration department to sign all necessary forms in order for [place] to release information regarding their care.   Consent: Patient/Guardian gives verbal consent for treatment and assignment of benefits for services provided during this visit. Patient/Guardian expressed understanding and agreed to proceed.    [name], MD 02/11/2022, 2:20 PM

## 2022-02-13 ENCOUNTER — Ambulatory Visit: Payer: PPO | Admitting: Psychiatry

## 2022-02-14 ENCOUNTER — Telehealth: Payer: Self-pay | Admitting: Pharmacy Technician

## 2022-02-14 DIAGNOSIS — Z596 Low income: Secondary | ICD-10-CM

## 2022-02-14 NOTE — Progress Notes (Signed)
[facility])                                            [place] Team    02/14/2022  Lisa Gutierrez November 13, 1950 [id number]                                      Medication Assistance Referral-FOR 2024 RE ENROLLMENT  Referral From: [place]  Medication/Company: Rolan Lipa / [employer] Patient application portion:  Mailed Provider application portion: Faxed  to [name], NP Provider address/fax verified via: Office website  Medication/Company: Vania Rea / [employer] Patient application portion:  Mailed Provider application portion: Faxed  to [name], NP Provider address/fax verified via: Office website  Ande Therrell P. Lodema [place], House  [phone]

## 2022-02-26 ENCOUNTER — Other Ambulatory Visit: Payer: Self-pay | Admitting: Physician Assistant

## 2022-02-26 DIAGNOSIS — I10 Essential (primary) hypertension: Secondary | ICD-10-CM

## 2022-03-13 ENCOUNTER — Encounter: Payer: Self-pay | Admitting: Nurse Practitioner

## 2022-03-13 ENCOUNTER — Other Ambulatory Visit: Payer: Self-pay | Admitting: Nurse Practitioner

## 2022-03-13 DIAGNOSIS — J069 Acute upper respiratory infection, unspecified: Secondary | ICD-10-CM

## 2022-03-13 MED ORDER — MOLNUPIRAVIR EUA 200MG CAPSULE: 4.0000 | ORAL_CAPSULE | Freq: Two times a day (BID) | ORAL | 0 refills | Status: AC

## 2022-03-13 NOTE — Progress Notes (Signed)
Patient on Jardiance and currently being treated for yeast infection.

## 2022-03-14 ENCOUNTER — Telehealth: Payer: Self-pay

## 2022-03-14 ENCOUNTER — Other Ambulatory Visit: Payer: Self-pay | Admitting: Nurse Practitioner

## 2022-03-14 DIAGNOSIS — E1165 Type 2 diabetes mellitus with hyperglycemia: Secondary | ICD-10-CM

## 2022-03-14 MED ORDER — ACCU-CHEK SMARTVIEW VI STRP: 1.0000 | ORAL_STRIP | Freq: Two times a day (BID) | 3 refills | Status: DC | PRN

## 2022-03-14 NOTE — Telephone Encounter (Signed)
I sent a new prescription for strips to pharmacy with a note to have them fill with alternative her insurance prefers.

## 2022-03-14 NOTE — Telephone Encounter (Signed)
Pharmacy is requesting another alternative for [employer] Strips

## 2022-03-17 ENCOUNTER — Other Ambulatory Visit: Payer: Self-pay | Admitting: Psychiatry

## 2022-03-18 ENCOUNTER — Other Ambulatory Visit: Payer: Self-pay | Admitting: Psychiatry

## 2022-03-18 MED ORDER — BUSPIRONE HCL 5 MG PO TABS: 5.0000 mg | ORAL_TABLET | Freq: Two times a day (BID) | ORAL | 0 refills | Status: DC

## 2022-03-18 NOTE — Telephone Encounter (Signed)
Ordered a refill of buspirone per request. Please contact the patient to make a follow up appointment for 30 mins. Thanks

## 2022-03-19 NOTE — Telephone Encounter (Signed)
Message left for patient to call and schedule appointment

## 2022-03-20 ENCOUNTER — Ambulatory Visit (INDEPENDENT_AMBULATORY_CARE_PROVIDER_SITE_OTHER): Payer: PPO | Admitting: Licensed Clinical Social Worker

## 2022-03-20 DIAGNOSIS — F419 Anxiety disorder, unspecified: Secondary | ICD-10-CM | POA: Diagnosis not present

## 2022-03-20 DIAGNOSIS — F330 Major depressive disorder, recurrent, mild: Secondary | ICD-10-CM

## 2022-03-20 NOTE — Progress Notes (Signed)
Virtual Visit via Audio Note  I connected with Lisa Gutierrez on 03/20/22 at 10:00 AM EST by an audio enabled telemedicine application and verified that I am speaking with the correct person using two identifiers.  Location: Patient: home Provider: remote office [place], [place])   I discussed the limitations of evaluation and management by telemedicine and the availability of in person appointments. The patient expressed understanding and agreed to proceed.   I discussed the assessment and treatment plan with the patient. The patient was provided an opportunity to ask questions and all were answered. The patient agreed with the plan and demonstrated an understanding of the instructions.   The patient was advised to call back or seek an in-person evaluation if the symptoms worsen or if the condition fails to improve as anticipated.  I provided 20 minutes of non-face-to-face time during this encounter.   Lisa Yott R Adell Panek, LCSW   THERAPIST PROGRESS NOTE  Session Time: 10-1020a  Participation Level: Active  Behavioral Response: NAAlertDepressed and Dysphoric  Type of Therapy: Individual Therapy  Treatment Goals addressed: Problem: Decrease depressive symptoms and improve levels of effective functioning Goal: LTG: Reduce frequency, intensity, and duration of depression symptoms as evidenced by: SSB input needed on appropriate metric Outcome: Progressing Goal: STG: '@PREFFIRSTNAME'$ @ will participate in at least 80% of scheduled individual psychotherapy sessions Outcome: Progressing Intervention: Encourage verbalization of feelings/concerns/expectations Note: Allowed pt to explore/express--explored current coping skills  ProgressTowards Goals: Progressing  Interventions: CBT and Supportive (see above)  Summary: Lisa Gutierrez is a 71 y.o. female who presents with improving symptoms related to depression.  Allowed pt to explore and express thoughts and feelings  associated with recent life situations and external stressors.Patient reports that she currently is experiencing COVID and does not feel well. Patient reports that she and husband both have COVID, are running fevers, and are not feeling well. Patient reports that they are making it, in nursing themselves as best they can.  Patient reports that overall mood has been fairly stable and patient reports that she is managing situational stressors as well. Patient reports that she is being intentional about engaging in activities that she feels brings her happiness. Patient reports that she and husband recently had a trip to the mountains, and that this was a very pleasurable trip. Patient also reports that she recently went to [employer], and thoroughly enjoyed it. Patient reports that physically it's difficult to move around, but patient makes it work.  Patient reports that she has set limits and set boundaries with holiday related activities, including cooking at Thanksgiving and Christmas. Patient reports that she did not cook at thanksgiving--only a small meal for herself and her husband. Patient reports that she is doing better at recognizing what she physically can do, and this is a decision that she made so she would not push herself physically to cook a large meal.  Patient reports that overall sleep has been restless, and that appetite has been inconsistent DU2 her illness.  Patient requested to end the session early due to her not feeling well.   Continued recommendations are as follows: self care behaviors, positive social engagements, focusing on overall work/home/life balance, and focusing on positive physical and emotional wellness.   Suicidal/Homicidal: No  Therapist Response: Pt is continuing to apply interventions learned in session into daily life situations. Pt is currently on track to meet goals utilizing interventions mentioned above. Personal growth and  progress noted. Treatment to continue as indicated.   Plan:  Return again 4 weeks or PRN.  Diagnosis:  Encounter Diagnoses  Name Primary?   MDD (major depressive disorder), recurrent episode, mild ([place]) Yes   Anxiety    Collaboration of Care: Other pt encouraged to continue with psychiatrist of record, Dr. Modesta Messing  Patient/Guardian was advised Release of Information must be obtained prior to any record release in order to collaborate their care with an outside provider. Patient/Guardian was advised if they have not already done so to contact the registration department to sign all necessary forms in order for [place] to release information regarding their care.   Consent: Patient/Guardian gives verbal consent for treatment and assignment of benefits for services provided during this visit. Patient/Guardian expressed understanding and agreed to proceed.   [name], LCSW 03/20/2022.

## 2022-03-25 ENCOUNTER — Telehealth: Payer: Self-pay | Admitting: Pharmacy Technician

## 2022-03-25 DIAGNOSIS — Z596 Low income: Secondary | ICD-10-CM

## 2022-03-25 NOTE — Progress Notes (Signed)
[place] [facility])                                            [place] Team    03/25/2022  Falisha Osment Feb 25, 1951 [id number]  Received both patient and provider portion(s) of patient assistance application(s) for Linzess and Jardiance. Faxed completed application and required documents into [employer] and [employer].   Toney Lizaola P. Deundra Bard, [place]  [phone]

## 2022-03-28 ENCOUNTER — Other Ambulatory Visit: Payer: Self-pay | Admitting: Nurse Practitioner

## 2022-03-28 DIAGNOSIS — J069 Acute upper respiratory infection, unspecified: Secondary | ICD-10-CM

## 2022-03-28 MED ORDER — AZITHROMYCIN 250 MG PO TABS: ORAL_TABLET | ORAL | 0 refills | Status: DC

## 2022-03-29 ENCOUNTER — Other Ambulatory Visit: Payer: Self-pay | Admitting: Nurse Practitioner

## 2022-03-29 NOTE — Telephone Encounter (Signed)
Office visit required for refills.

## 2022-04-05 ENCOUNTER — Other Ambulatory Visit: Payer: Self-pay | Admitting: Nurse Practitioner

## 2022-04-05 DIAGNOSIS — J0141 Acute recurrent pansinusitis: Secondary | ICD-10-CM

## 2022-04-05 MED ORDER — DOXYCYCLINE HYCLATE 100 MG PO TABS: 100.0000 mg | ORAL_TABLET | Freq: Two times a day (BID) | ORAL | 0 refills | Status: DC

## 2022-04-17 ENCOUNTER — Telehealth: Payer: Self-pay | Admitting: *Deleted

## 2022-04-17 NOTE — Telephone Encounter (Signed)
Pt did agree to change appointment. Jasper Hanf Zimmerman Rumple, CMA

## 2022-04-17 NOTE — Telephone Encounter (Signed)
LVM for pt to call office to see about changing her schedule to Friday 04/20/22 @ 10:30am due to having an overbooked schedule. Please change if she is agreeable to the time. Crystall Donaldson Zimmerman Rumple, CMA

## 2022-04-18 ENCOUNTER — Ambulatory Visit: Payer: PPO | Admitting: Nurse Practitioner

## 2022-04-20 ENCOUNTER — Ambulatory Visit: Payer: PPO | Admitting: Nurse Practitioner

## 2022-04-21 ENCOUNTER — Other Ambulatory Visit: Payer: Self-pay | Admitting: Psychiatry

## 2022-04-23 ENCOUNTER — Telehealth: Payer: Self-pay | Admitting: Pharmacy Technician

## 2022-04-23 DIAGNOSIS — J309 Allergic rhinitis, unspecified: Secondary | ICD-10-CM | POA: Diagnosis not present

## 2022-04-23 DIAGNOSIS — Z596 Low income: Secondary | ICD-10-CM

## 2022-04-23 DIAGNOSIS — R0981 Nasal congestion: Secondary | ICD-10-CM | POA: Diagnosis not present

## 2022-04-23 DIAGNOSIS — R070 Pain in throat: Secondary | ICD-10-CM | POA: Diagnosis not present

## 2022-04-23 DIAGNOSIS — J329 Chronic sinusitis, unspecified: Secondary | ICD-10-CM | POA: Diagnosis not present

## 2022-04-23 NOTE — Progress Notes (Signed)
[facility])                                            [place] Team    04/23/2022  Lisa Gutierrez 1951/03/04 [id number]  Two care coordination calls made today to [place] in regard to [place] and to [place] in regard to [place].  Spoke to [place] at [facility] who informs patient is APPROVED 04/16/22-04/16/23 for Jardiance. Patient will have to phone [employer] for refills and then the medication will be delivered to the patient's home.  Spoke to [employer] at [employer] who informs the [place] application was missing information. Was able to correct the missing information issue and refaxed to [place].  Damico Partin P. Novice Vrba, [place]  [phone]

## 2022-04-24 ENCOUNTER — Other Ambulatory Visit: Payer: Self-pay | Admitting: Nurse Practitioner

## 2022-04-24 ENCOUNTER — Other Ambulatory Visit: Payer: Self-pay | Admitting: Psychiatry

## 2022-04-24 DIAGNOSIS — B372 Candidiasis of skin and nail: Secondary | ICD-10-CM

## 2022-04-24 NOTE — Telephone Encounter (Signed)
Ordered refill of buspar per request. Please contact the patient, and ask if her medication will be taken over by PCP as she wished. If not, please make a follow up appointment with me- although in person is strongly recommended, virtual is fine this time if she declines. Please also notify the patient that I will not be able to prescribe any more refills without evaluation- I have not seen her since August.

## 2022-04-27 ENCOUNTER — Telehealth: Payer: Self-pay | Admitting: Pharmacy Technician

## 2022-04-27 DIAGNOSIS — Z596 Low income: Secondary | ICD-10-CM

## 2022-04-27 NOTE — Telephone Encounter (Signed)
Message left to call and schedule.

## 2022-04-27 NOTE — Progress Notes (Signed)
[place] [facility])                                            [place] Team    04/27/2022  Lisa Gutierrez 28-Jan-1951 [id number]  Care coordination call placed to [place] in regard to [place] application.  Spoke to [place] who informs patient is APPROVED 04/27/29-04/16/23. Patient will need to call [employer] to order refills as she needs them and then medication will be shipped to patient's home address on file.  [name] P. Rumeal Cullipher, [place]  [phone]

## 2022-05-01 ENCOUNTER — Ambulatory Visit (INDEPENDENT_AMBULATORY_CARE_PROVIDER_SITE_OTHER): Payer: Medicare HMO | Admitting: Nurse Practitioner

## 2022-05-01 ENCOUNTER — Encounter: Payer: Self-pay | Admitting: Nurse Practitioner

## 2022-05-01 DIAGNOSIS — B379 Candidiasis, unspecified: Secondary | ICD-10-CM | POA: Diagnosis not present

## 2022-05-01 DIAGNOSIS — J0141 Acute recurrent pansinusitis: Secondary | ICD-10-CM

## 2022-05-01 DIAGNOSIS — E1165 Type 2 diabetes mellitus with hyperglycemia: Secondary | ICD-10-CM

## 2022-05-01 DIAGNOSIS — I10 Essential (primary) hypertension: Secondary | ICD-10-CM | POA: Diagnosis not present

## 2022-05-01 MED ORDER — FLUCONAZOLE 150 MG PO TABS: ORAL_TABLET | ORAL | 2 refills | Status: DC

## 2022-05-01 MED ORDER — ACCU-CHEK SMARTVIEW VI STRP: 1.0000 | ORAL_STRIP | Freq: Two times a day (BID) | 3 refills | Status: DC

## 2022-05-01 NOTE — Progress Notes (Signed)
Established patient visit  Patient: Lisa Gutierrez   DOB: [birth year]   72 y.o. Female  MRN: [id number] Visit Date: 05/01/2022  Virtual Visit via Telephone Note  I connected with Lisa Gutierrez on 06/07/22 at  9:30 AM EST by telephone and verified that I am speaking with the correct person using two identifiers.  Location: Patient: home  Provider: [place] [facility] at [facility]     I discussed the limitations, risks, security and privacy concerns of performing an evaluation and management service by telephone and the availability of in person appointments. I also discussed with the patient that there may be a patient responsible charge related to this service. The patient expressed understanding and agreed to proceed.  Chief Complaint  Patient presents with   Blurred Vision        Nasal Congestion        Fatigue   Vaginitis        Subjective    HPI HPI     Blurred Vision    Additional comments:          Nasal Congestion    Additional comments:          Vaginitis    Additional comments:        Last edited by Gemma Payor, CMA on 05/01/2022  9:30 AM.      Diagnosed with COVID 19 four days after Thanksgiving.  -blurred vision -no taste or small -persistent nasal congestion  -feels down. No energy. Does not feel like going anywhere.  -worse depression.  -has seen ENT provider - scheduled for CT sinuses on 05/08/2022.  -will see psychiatry tomorrow.  -has eye appointment scheduled for 06/11/2022 -also has yeast infection.    Medications: Outpatient Medications Prior to Visit  Medication Sig   acetaminophen (TYLENOL) 500 MG tablet Take 2 tablets by mouth as needed.   acetic acid-hydrocortisone (VOSOL-HC) OTIC solution Place 4 drops into the right ear 2 (two) times daily.   ALPRAZolam (XANAX) 0.5 MG tablet Take 0.5 mg by mouth 2 (two) times daily as needed.   Bioflavonoid Products (VITAMIN C) CHEW Chew by mouth. With vitamin D  and elderberry   Calcium Carbonate (CALCIUM 500 PO) Take by mouth.   doxycycline (VIBRA-TABS) 100 MG tablet Take 1 tablet (100 mg total) by mouth 2 (two) times daily.   Flaxseed, Linseed, (FLAXSEED OIL PO) Take 1,500 mg by mouth 2 (two) times daily.   fluticasone (FLONASE) 50 MCG/ACT nasal spray Place 2 sprays into both nostrils daily.   FLUZONE HIGH-DOSE QUADRIVALENT 0.7 ML SUSY    JANUMET 50-1000 MG tablet TAKE ONE TABLET BY MOUTH TWICE DAILY   JARDIANCE 10 MG TABS tablet TAKE 1 TABLET BY MOUTH EVERY DAY BEFORE BREAKFAST   LINZESS 145 MCG CAPS capsule TAKE 1 CAPSULE BY MOUTH EVERY DAY   meclizine (ANTIVERT) 25 MG tablet Take 1 tablet (25 mg total) by mouth 2 (two) times daily as needed for dizziness.   metoprolol succinate (TOPROL-XL) 25 MG 24 hr tablet TAKE 2 TABLETS BY MOUTH EVERY DAY   Multiple Vitamins-Minerals (MULTIVITAMIN ADULT PO) Take 1 tablet by mouth daily.   nystatin (MYCOSTATIN/NYSTOP) powder APPLY TO AFFECTED AREA TWICE A DAY   pantoprazole (PROTONIX) 40 MG tablet TAKE 1 TABLET BY MOUTH TWICE A DAY   sitaGLIPtin-metformin (JANUMET) 50-500 MG tablet Take 1 tablet by mouth 2 (two) times daily with a meal.   telmisartan-hydrochlorothiazide (MICARDIS HCT) 80-25 MG tablet TAKE 1 TABLET BY MOUTH EVERY DAY   [  DISCONTINUED] busPIRone (BUSPAR) 5 MG tablet Take 1 tablet (5 mg total) by mouth 2 (two) times daily.   [DISCONTINUED] fluconazole (DIFLUCAN) 150 MG tablet Take 1 tablet by mouth every 3 days.   [DISCONTINUED] glucose blood (ACCU-CHEK SMARTVIEW) test strip 1 each by Other route 2 (two) times daily as needed for other. Use as instructed   [DISCONTINUED] nystatin ointment (MYCOSTATIN) Apply 1 application topically 2 (two) times daily.   [DISCONTINUED] venlafaxine XR (EFFEXOR-XR) 150 MG 24 hr capsule Take 1 capsule (150 mg total) by mouth daily with breakfast.   No facility-administered medications prior to visit.     Review of Systems  Constitutional:  Positive for chills.  Negative for fever.  HENT:  Positive for congestion, ear pain, sinus pain and sore throat.   Respiratory:  Positive for cough and wheezing.   Cardiovascular: Negative.   Gastrointestinal:  Positive for abdominal pain and nausea.  Skin: Negative.   Neurological:  Positive for headaches.  Endo/Heme/Allergies:  Positive for environmental allergies.  Psychiatric/Behavioral:  The patient is nervous/anxious.        Objective    BP Readings from Last 3 Encounters:  08/24/21 133/82  12/29/20 128/89  07/05/20 122/86    Wt Readings from Last 3 Encounters:  01/01/22 245 lb (111.1 kg)  08/24/21 245 lb 1.9 oz (111.2 kg)  07/19/21 234 lb (106.1 kg)     The patient is alert and oriented. She is pleasant and answers all questions appropriately. Breathing is non-labored. She is in no acute distress at this time.  She is nasally congested and sounds worried. She does have congested, nonproductive cough which can be heard during today's visit.    Assessment & Plan    1. Uncontrolled type 2 diabetes mellitus with hyperglycemia [facility]) New prescription for test strips sent to her pharmacy. She should be checking blood sugars fasting, every morning and two hours after eating the last meal of the day. Goal is to have sugars between 70 and 120.  - glucose blood (ACCU-CHEK SMARTVIEW) test strip; 1 each by Other route in the morning and at bedtime. Use as instructed  Dispense: 200 strip; Refill: 3  2. Yeast infection May take diflucan 150 mg every other day for five doses.  - fluconazole (DIFLUCAN) 150 MG tablet; Take 1 tablet po QOD for 5 doses. Repeat treatment as needed  Dispense: 5 tablet; Refill: 2  3. Essential hypertension Stable. Continue bp medication as prescribed   4. Acute recurrent pansinusitis Continue to see ENT as scheduled.    Problem List Items Addressed This Visit       Cardiovascular and Mediastinum   Essential hypertension     Respiratory   Acute recurrent pansinusitis    Relevant Medications   fluticasone (FLONASE) 50 MCG/ACT nasal spray   fluconazole (DIFLUCAN) 150 MG tablet     Endocrine   Uncontrolled type 2 diabetes mellitus with hyperglycemia (HCC) - Primary   Relevant Medications   glucose blood (ACCU-CHEK SMARTVIEW) test strip     Other   Yeast infection   Relevant Medications   fluconazole (DIFLUCAN) 150 MG tablet    discussed the assessment and treatment plan with the patient. The patient was provided an opportunity to ask questions and all were answered. The patient agreed with the plan and demonstrated an understanding of the instructions.   The patient was advised to call back or seek an in-person evaluation if the symptoms worsen or if the condition fails to improve as anticipated.  I provided 20 minutes of non-face-to-face time during this encounter.   Ronnell Freshwater, NP   Return in about 6 weeks (around 06/12/2022) for diabetes with HgbA1c check. will need urine microalbumin check .         Ronnell Freshwater, NP  [facility] at [facility] [phone] (phone) [phone] (fax)  [place]

## 2022-05-04 ENCOUNTER — Ambulatory Visit (INDEPENDENT_AMBULATORY_CARE_PROVIDER_SITE_OTHER): Payer: Medicare HMO | Admitting: Licensed Clinical Social Worker

## 2022-05-04 DIAGNOSIS — R69 Illness, unspecified: Secondary | ICD-10-CM | POA: Diagnosis not present

## 2022-05-04 DIAGNOSIS — F419 Anxiety disorder, unspecified: Secondary | ICD-10-CM | POA: Diagnosis not present

## 2022-05-04 DIAGNOSIS — F330 Major depressive disorder, recurrent, mild: Secondary | ICD-10-CM | POA: Diagnosis not present

## 2022-05-05 NOTE — Progress Notes (Signed)
Virtual Visit via Audio Note  I connected with Lisa Gutierrez on 05/04/22 at 11:00 AM EST by an audio enabled telemedicine application and verified that I am speaking with the correct person using two identifiers.  Location: Patient: home Provider: remote office [place], [place])   I discussed the limitations of evaluation and management by telemedicine and the availability of in person appointments. The patient expressed understanding and agreed to proceed.   I discussed the assessment and treatment plan with the patient. The patient was provided an opportunity to ask questions and all were answered. The patient agreed with the plan and demonstrated an understanding of the instructions.   The patient was advised to call back or seek an in-person evaluation if the symptoms worsen or if the condition fails to improve as anticipated.  I provided 20 minutes of non-face-to-face time during this encounter.   [place], LCSW   THERAPIST PROGRESS NOTE  Session Time: [id number]  Participation Level: Active  Behavioral Response: NAAlertDepressed and Dysphoric  Type of Therapy: Individual Therapy  Treatment Goals addressed: Problem: Decrease depressive symptoms and improve levels of effective functioning Goal: LTG: Reduce frequency, intensity, and duration of depression symptoms as evidenced by: SSB input needed on appropriate metric Outcome: Progressing Goal: STG: '@PREFFIRSTNAME'$ @ will participate in at least 80% of scheduled individual psychotherapy sessions Outcome: Progressing Intervention: Encourage verbalization of feelings/concerns/expectations Note: Allowed pt to explore/express--explored current coping skills  ProgressTowards Goals: Progressing  Interventions: CBT and Supportive (see above)  Summary: Lisa Gutierrez is a 72 y.o. female who presents with improving symptoms related to depression.  Allowed pt to explore and express thoughts and feelings  associated with recent life situations and external stressors. Discussed stress associated with recent covid infection and ongoing medical concerns/issues related to that. Pt feels unsupported by husband and family members.   Explored pts relationship w/ husband and multiple family members. Discussed health related concerns and pt worrying about losing her independence, since she currently cannot drive due to vision changes. Discussed negative thoughts about self (challenged thoughts w/ CBT), ongoing family conflicts, death of nephew at age 47 and ongoing conflict w/ sister. Allowed pt to view her own perspective about all of these situations and recognize the psychological impact on present thoughts/feelings. Discussed areas where pt is in control and areas that are out of control. Allowed pt to identify strengths and positive activities that she can engage in.  Continued recommendations are as follows: self care behaviors, positive social engagements, focusing on overall work/home/life balance, and focusing on positive physical and emotional wellness.   Suicidal/Homicidal: No  Therapist Response: Pt is continuing to apply interventions learned in session into daily life situations. Pt is currently on track to meet goals utilizing interventions mentioned above. Personal growth and progress noted. Treatment to continue as indicated.   Plan: Return again 4 weeks or PRN.  Diagnosis:  Encounter Diagnoses  Name Primary?   MDD (major depressive disorder), recurrent episode, mild ([place]) Yes   Anxiety     Collaboration of Care: Other pt encouraged to continue with psychiatrist of record, Dr. Modesta Messing  Patient/Guardian was advised Release of Information must be obtained prior to any record release in order to collaborate their care with an outside provider. Patient/Guardian was advised if they have not already done so to contact the registration department to sign all necessary forms in order for [place] to  release information regarding their care.   Consent: Patient/Guardian gives verbal consent for treatment and assignment of benefits  for services provided during this visit. Patient/Guardian expressed understanding and agreed to proceed.   [place], LCSW 05/05/2022.

## 2022-05-08 DIAGNOSIS — J309 Allergic rhinitis, unspecified: Secondary | ICD-10-CM | POA: Diagnosis not present

## 2022-05-08 DIAGNOSIS — R070 Pain in throat: Secondary | ICD-10-CM | POA: Diagnosis not present

## 2022-05-08 DIAGNOSIS — J329 Chronic sinusitis, unspecified: Secondary | ICD-10-CM | POA: Diagnosis not present

## 2022-05-08 DIAGNOSIS — R0981 Nasal congestion: Secondary | ICD-10-CM | POA: Diagnosis not present

## 2022-05-14 ENCOUNTER — Encounter: Payer: Self-pay | Admitting: Nurse Practitioner

## 2022-05-23 ENCOUNTER — Encounter: Payer: Self-pay | Admitting: Nurse Practitioner

## 2022-05-23 ENCOUNTER — Other Ambulatory Visit: Payer: Self-pay | Admitting: Psychiatry

## 2022-05-25 ENCOUNTER — Encounter: Payer: Self-pay | Admitting: Nurse Practitioner

## 2022-05-25 ENCOUNTER — Other Ambulatory Visit: Payer: Self-pay | Admitting: Psychiatry

## 2022-06-03 ENCOUNTER — Other Ambulatory Visit: Payer: Self-pay | Admitting: Nurse Practitioner

## 2022-06-03 DIAGNOSIS — B379 Candidiasis, unspecified: Secondary | ICD-10-CM

## 2022-06-05 ENCOUNTER — Ambulatory Visit: Payer: PPO | Admitting: Psychiatry

## 2022-06-06 DIAGNOSIS — D2272 Melanocytic nevi of left lower limb, including hip: Secondary | ICD-10-CM | POA: Diagnosis not present

## 2022-06-06 DIAGNOSIS — L570 Actinic keratosis: Secondary | ICD-10-CM | POA: Diagnosis not present

## 2022-06-06 DIAGNOSIS — L508 Other urticaria: Secondary | ICD-10-CM | POA: Diagnosis not present

## 2022-06-06 DIAGNOSIS — L820 Inflamed seborrheic keratosis: Secondary | ICD-10-CM | POA: Diagnosis not present

## 2022-06-06 DIAGNOSIS — L309 Dermatitis, unspecified: Secondary | ICD-10-CM | POA: Diagnosis not present

## 2022-06-06 DIAGNOSIS — L298 Other pruritus: Secondary | ICD-10-CM | POA: Diagnosis not present

## 2022-06-06 DIAGNOSIS — D2261 Melanocytic nevi of right upper limb, including shoulder: Secondary | ICD-10-CM | POA: Diagnosis not present

## 2022-06-06 DIAGNOSIS — D225 Melanocytic nevi of trunk: Secondary | ICD-10-CM | POA: Diagnosis not present

## 2022-06-06 DIAGNOSIS — D2262 Melanocytic nevi of left upper limb, including shoulder: Secondary | ICD-10-CM | POA: Diagnosis not present

## 2022-06-06 DIAGNOSIS — L900 Lichen sclerosus et atrophicus: Secondary | ICD-10-CM | POA: Diagnosis not present

## 2022-06-06 DIAGNOSIS — L821 Other seborrheic keratosis: Secondary | ICD-10-CM | POA: Diagnosis not present

## 2022-06-06 DIAGNOSIS — D2271 Melanocytic nevi of right lower limb, including hip: Secondary | ICD-10-CM | POA: Diagnosis not present

## 2022-06-09 NOTE — Progress Notes (Signed)
Virtual Visit via Video Note  I connected with Lisa Gutierrez on 06/15/22 at 10:30 AM EST by a video enabled telemedicine application and verified that I am speaking with the correct person using two identifiers.  Location: Patient: home Provider: office Persons participated in the visit- patient, provider    I discussed the limitations of evaluation and management by telemedicine and the availability of in person appointments. The patient expressed understanding and agreed to proceed.    I discussed the assessment and treatment plan with the patient. The patient was provided an opportunity to ask questions and all were answered. The patient agreed with the plan and demonstrated an understanding of the instructions.   The patient was advised to call back or seek an in-person evaluation if the symptoms worsen or if the condition fails to improve as anticipated.  I provided 15 minutes of non-face-to-face time during this encounter.   [name], MD    [facility] MD/PA/NP OP Progress Note  06/15/2022 10:59 AM [name]  MRN:  [id number]  Chief Complaint:  Chief Complaint  Patient presents with   Follow-up   HPI:  - she is not seen since August 2023 This is a follow-up appointment for depression, anxiety.  She states that she made this appointment as she has been more irritable.  She wonders if she is feeling this way due to medication.  She states that it has been like a roller coaster.  She had COVID again, which is a 3 times.  She has dysgeusia.  She has visual impairment, and will need to have a cataract surgery.  Her husband has been jerk.  There was a time she felt SI, and her husband told her that her psychiatrist and therapist are not helping her.  It made her feel very upset.  She denies any SI lately as she has a hope that she will have somebody, who understands her.  She hopes things will change.  She has not been able to go to church as she is afraid of fall  due to visual issues.  She loves taking care of her puppy.  She sleeps up to 7 hours.  She has decreased appetite due to dysgeusia.  She denies panic attack.  She denies alcohol use or drug use.  She is willing to try higher dose of venlafaxine at this time.   Daily routine: takes care of her dogs, cats, house chores Exercise: unable to do due to knee pain Employment:  Support: none (not communicating with her oldest granddaughter, who left for college as much anymore) Household: husband Marital status: married for 78 years  Number of children: 2 sons.   Wt Readings from Last 3 Encounters:  01/01/22 245 lb (111.1 kg)  08/24/21 245 lb 1.9 oz (111.2 kg)  07/19/21 234 lb (106.1 kg)     Visit Diagnosis:    ICD-10-CM   1. MDD (major depressive disorder), recurrent episode, mild ([place])  F33.0     2. Anxiety  F41.9       Past Psychiatric History: Please see initial evaluation for full details. I have reviewed the history. No updates at this time.     Past Medical History:  Past Medical History:  Diagnosis Date   Anxiety    Depression    Depression    Phreesia 07/02/2020   Diabetes mellitus without complication (HCC)    GERD (gastroesophageal reflux disease)    Hypertension    IBS (irritable bowel syndrome)  Neuromuscular disorder [facility])     Past Surgical History:  Procedure Laterality Date   BREAST EXCISIONAL BIOPSY     CESAREAN SECTION N/A    Phreesia 07/02/2020   CHOLECYSTECTOMY     EXCISION / BIOPSY BREAST / NIPPLE / DUCT Right 1973   duct removed   SPINE SURGERY N/A    Phreesia 07/02/2020   TUBAL LIGATION N/A    Phreesia 07/02/2020    Family Psychiatric History: Please see initial evaluation for full details. I have reviewed the history. No updates at this time.     Family History:  Family History  Problem Relation Age of Onset   Diabetes Mother    Hypertension Mother    Coronary artery disease Father    Glaucoma Father    Breast cancer Neg Hx      Social History:  Social History   Socioeconomic History   Marital status: Married    Spouse name: Not on file   Number of children: Not on file   Years of education: Not on file   Highest education level: Not on file  Occupational History   Not on file  Tobacco Use   Smoking status: Former   Smokeless tobacco: Never  Substance and Sexual Activity   Alcohol use: No    Alcohol/week: 0.0 standard drinks of alcohol   Drug use: Never   Sexual activity: Not Currently    Partners: Male  Other Topics Concern   Not on file  Social History Narrative   Not on file   Social Determinants of Health   Financial Resource Strain: Not on file  Food Insecurity: Not on file  Transportation Needs: Not on file  Physical Activity: Not on file  Stress: Not on file  Social Connections: Not on file    Allergies:  Allergies  Allergen Reactions   Aspirin Other (See Comments)    GI upset   Ibuprofen Other (See Comments)    GERD   Levofloxacin Other (See Comments)    Weight gain   Meloxicam Other (See Comments)    GI upset   Morphine Other (See Comments)   Naprosyn  [Naproxen] Other (See Comments)    GI upset   Nsaids Other (See Comments)    GI upset   Quinolones    Robinul  [Glycopyrrolate] Nausea And Vomiting   Penicillin V Potassium Rash   Sulfa Antibiotics Rash    Metabolic Disorder Labs: Lab Results  Component Value Date   HGBA1C 7.7 (H) 08/24/2021   No results found for: "PROLACTIN" Lab Results  Component Value Date   CHOL 195 08/24/2021   TRIG 233 (H) 08/24/2021   HDL 31 (L) 08/24/2021   CHOLHDL 6.3 (H) 08/24/2021   LDLCALC 123 (H) 08/24/2021   Lab Results  Component Value Date   TSH 1.040 08/24/2021    Therapeutic Level Labs: No results found for: "LITHIUM" No results found for: "VALPROATE" No results found for: "CBMZ"  Current Medications: Current Outpatient Medications  Medication Sig Dispense Refill   venlafaxine XR (EFFEXOR-XR) 37.5 MG 24 hr  capsule Take 1 capsule (37.5 mg total) by mouth daily with breakfast. Take a total of 187.5 mg daily. Take along with 150 mg cap 30 capsule 1   acetaminophen (TYLENOL) 500 MG tablet Take 2 tablets by mouth as needed.     acetic acid-hydrocortisone (VOSOL-HC) OTIC solution Place 4 drops into the right ear 2 (two) times daily. 10 mL 0   ALPRAZolam (XANAX) 0.5 MG tablet Take 0.5  mg by mouth 2 (two) times daily as needed.     Bioflavonoid Products (VITAMIN C) CHEW Chew by mouth. With vitamin D and elderberry     [START ON 06/23/2022] busPIRone (BUSPAR) 5 MG tablet Take 1 tablet (5 mg total) by mouth 2 (two) times daily. 180 tablet 0   Calcium Carbonate (CALCIUM 500 PO) Take by mouth.     doxycycline (VIBRA-TABS) 100 MG tablet Take 1 tablet (100 mg total) by mouth 2 (two) times daily. 14 tablet 0   Flaxseed, Linseed, (FLAXSEED OIL PO) Take 1,500 mg by mouth 2 (two) times daily.     fluconazole (DIFLUCAN) 150 MG tablet Take 1 tablet po QOD for 5 doses. Repeat treatment as needed 5 tablet 2   fluticasone (FLONASE) 50 MCG/ACT nasal spray Place 2 sprays into both nostrils daily.     FLUZONE HIGH-DOSE QUADRIVALENT 0.7 ML SUSY      glucose blood (ACCU-CHEK SMARTVIEW) test strip 1 each by Other route in the morning and at bedtime. Use as instructed 200 strip 3   JANUMET 50-1000 MG tablet TAKE ONE TABLET BY MOUTH TWICE DAILY 180 tablet 0   JARDIANCE 10 MG TABS tablet TAKE 1 TABLET BY MOUTH EVERY DAY BEFORE BREAKFAST 30 tablet 1   LINZESS 145 MCG CAPS capsule TAKE 1 CAPSULE BY MOUTH EVERY DAY 30 capsule 2   meclizine (ANTIVERT) 25 MG tablet Take 1 tablet (25 mg total) by mouth 2 (two) times daily as needed for dizziness. 60 tablet 1   metoprolol succinate (TOPROL-XL) 25 MG 24 hr tablet TAKE 2 TABLETS BY MOUTH EVERY DAY 180 tablet 1   Multiple Vitamins-Minerals (MULTIVITAMIN ADULT PO) Take 1 tablet by mouth daily.     nystatin (MYCOSTATIN/NYSTOP) powder APPLY TO AFFECTED AREA TWICE A DAY 60 g 3   nystatin  ointment (MYCOSTATIN) APPLY TO AFFECTED AREA TWICE A DAY 30 g 1   pantoprazole (PROTONIX) 40 MG tablet TAKE 1 TABLET BY MOUTH TWICE A DAY 180 tablet 1   sitaGLIPtin-metformin (JANUMET) 50-500 MG tablet Take 1 tablet by mouth 2 (two) times daily with a meal. 180 tablet 3   telmisartan-hydrochlorothiazide (MICARDIS HCT) 80-25 MG tablet Take 1 tablet by mouth daily. 90 tablet 0   venlafaxine XR (EFFEXOR-XR) 150 MG 24 hr capsule Take 1 capsule (150 mg total) by mouth daily with breakfast. 90 capsule 0   No current facility-administered medications for this visit.     Musculoskeletal: Strength & Muscle Tone:  N/A Gait & Station:  N/A Patient leans: N/A  Psychiatric Specialty Exam: Review of Systems  Psychiatric/Behavioral:  Positive for dysphoric mood. Negative for agitation, behavioral problems, confusion, decreased concentration, hallucinations, self-injury, sleep disturbance and suicidal ideas. The patient is nervous/anxious. The patient is not hyperactive.   All other systems reviewed and are negative.   There were no vitals taken for this visit.There is no height or weight on file to calculate BMI.  General Appearance: Fairly Groomed  Eye Contact:  Good  Speech:  Clear and Coherent  Volume:  Normal  Mood:  Irritable  Affect:  Appropriate and Congruent  Thought Process:  Coherent  Orientation:  Full (Time, Place, and Person)  Thought Content: Logical   Suicidal Thoughts:  No  Homicidal Thoughts:  No  Memory:  Immediate;   Good  Judgement:  Good  Insight:  Good  Psychomotor Activity:  Normal  Concentration:  Concentration: Good and Attention Span: Good  Recall:  Good  Fund of Knowledge: Good  Language: Good  Akathisia:  No  Handed:  Right  AIMS (if indicated): not done  Assets:  Communication Skills Desire for Improvement  ADL's:  Intact  Cognition: WNL  Sleep:  Fair   Screenings: GAD-7    Flowsheet Row Video Visit from 01/01/2022 in [facility] [place] from 08/24/2021 in [place] at [facility] Video Visit from 07/19/2021 in [place] at [facility] Video Visit from 05/03/2021 in [place] at [facility] Visit from 02/23/2021 in [place] at [facility]  Total GAD-7 Score '13 8 15 7 6      '$ [place] from 02/12/2020 in [facility] [place] from 02/06/2018 in [facility]  Total Score (max 30 points ) 29 30      PHQ2-9    Flowsheet Row Counselor from 03/20/2022 in [place] at [facility] Video Visit from 01/01/2022 in [place] at [place] from 12/26/2021 in [place] at [place] from 08/24/2021 in [place] at [facility] Video Visit from 07/19/2021 in [place] at [facility]  PHQ-2 Total Score '2 2 6 '$ 0 1  PHQ-9 Total Score -- '16 23 13 7      '$ Flowsheet Row Counselor from 05/04/2022 in [place] at [facility] from 03/20/2022 in [place] at [facility] from 12/26/2021 in [place] at [place] No Risk No Risk No Risk        Assessment and Plan:  Malyssa Mongillo is a 72 y.o. year old female with a history of  depression, hypertension, diabetes, GERD, who presents for follow up appointment for below.   1. MDD (major depressive disorder), recurrent episode, mild ([place]) 2. Anxiety Acute stressors include: pending cataract surgery, fall  Other stressors include: marital conflict, conflict with her children, financial strain  History:   She reports worsening in the irritability over the past few months in the context of stressors as above.  Will uptitrate venlafaxine to optimize treatment for depression,  anxiety. Discussed risk of hypertension. Will continue BuSpar for anxiety.  Noted that she is on Xanax, prescribed by her PCP.  The hope is to taper off this medication in the future.  She will greatly benefit from CBT; she will continue to see Ms. Hussami for therapy.    Plan Increase venlafaxine 187.5 mg daily- monitor weight gain Continue BuSpar 5 mg twice a day- monitor drowsiness Continue Xanax 0.5 mg daily as needed for anxiety Next appointment : 4/19 at 10:30, video.  She agrees to have it in person visit after she has cataract surgery   Past trials of medication: sertraline, citalopram, lexapro, fluoxetine, bupropion (headache)   The patient demonstrates the following risk factors for suicide: Chronic risk factors for suicide include: psychiatric disorder of depression and chronic pain. Acute risk factors for suicide include: family or marital conflict. Protective factors for this patient include: hope for the future. Considering these factors, the overall suicide risk at this point appears to be low. Patient is appropriate for outpatient follow up.   Collaboration of Care: Collaboration of Care: Other reviewed notes in Epic  Patient/Guardian was advised Release of Information must be obtained prior to any record release in order to collaborate their care with  an outside provider. Patient/Guardian was advised if they have not already done so to contact the registration department to sign all necessary forms in order for [place] to release information regarding their care.   Consent: Patient/Guardian gives verbal consent for treatment and assignment of benefits for services provided during this visit. Patient/Guardian expressed understanding and agreed to proceed.    [name], MD 06/15/2022, 10:59 AM

## 2022-06-11 ENCOUNTER — Other Ambulatory Visit: Payer: Self-pay

## 2022-06-11 ENCOUNTER — Telehealth: Payer: Self-pay

## 2022-06-11 DIAGNOSIS — H43811 Vitreous degeneration, right eye: Secondary | ICD-10-CM | POA: Diagnosis not present

## 2022-06-11 DIAGNOSIS — H2511 Age-related nuclear cataract, right eye: Secondary | ICD-10-CM | POA: Diagnosis not present

## 2022-06-11 DIAGNOSIS — H35379 Puckering of macula, unspecified eye: Secondary | ICD-10-CM | POA: Diagnosis not present

## 2022-06-11 DIAGNOSIS — E119 Type 2 diabetes mellitus without complications: Secondary | ICD-10-CM | POA: Diagnosis not present

## 2022-06-11 DIAGNOSIS — I10 Essential (primary) hypertension: Secondary | ICD-10-CM

## 2022-06-11 LAB — HM DIABETES EYE EXAM

## 2022-06-11 MED ORDER — TELMISARTAN-HCTZ 80-25 MG PO TABS: 1.0000 | ORAL_TABLET | Freq: Every day | ORAL | 0 refills | Status: DC

## 2022-06-11 NOTE — Telephone Encounter (Signed)
Pt is requesting a refill on  telmisartan-hydrochlorothiazide (MICARDIS HCT) 80-25 MG tablet   Ph[facility]: [id number] [place] [address]  LOV 05/01/22 ROV 07/16/22

## 2022-06-11 NOTE — Telephone Encounter (Signed)
Rx was sent to pharmarcy

## 2022-06-12 ENCOUNTER — Ambulatory Visit: Payer: Medicare HMO | Admitting: Nurse Practitioner

## 2022-06-15 ENCOUNTER — Encounter: Payer: Self-pay | Admitting: Psychiatry

## 2022-06-15 ENCOUNTER — Telehealth (INDEPENDENT_AMBULATORY_CARE_PROVIDER_SITE_OTHER): Payer: Medicare HMO | Admitting: Psychiatry

## 2022-06-15 DIAGNOSIS — F419 Anxiety disorder, unspecified: Secondary | ICD-10-CM

## 2022-06-15 DIAGNOSIS — F330 Major depressive disorder, recurrent, mild: Secondary | ICD-10-CM

## 2022-06-15 DIAGNOSIS — R69 Illness, unspecified: Secondary | ICD-10-CM | POA: Diagnosis not present

## 2022-06-15 MED ORDER — VENLAFAXINE HCL ER 37.5 MG PO CP24: 37.5000 mg | ORAL_CAPSULE | Freq: Every day | ORAL | 1 refills | Status: DC

## 2022-06-15 MED ORDER — BUSPIRONE HCL 5 MG PO TABS: 5.0000 mg | ORAL_TABLET | Freq: Two times a day (BID) | ORAL | 0 refills | Status: DC

## 2022-06-15 NOTE — Patient Instructions (Signed)
Increase venlafaxine 187.5 mg daily- Continue BuSpar 5 mg twice a day Continue Xanax 0.5 mg daily as needed for anxiety Next appointment : 4/19 at 10:30

## 2022-06-18 ENCOUNTER — Encounter: Payer: Self-pay | Admitting: Nurse Practitioner

## 2022-06-25 ENCOUNTER — Telehealth: Payer: Self-pay | Admitting: *Deleted

## 2022-06-25 NOTE — Telephone Encounter (Signed)
Pt calling stating she is not feeling well, she has had diarrhea, burping a lot and gas.  She said it started last Thursday evening when she ate 1/2 a burger, she thought she would die.  I informed her that a stomach bug is going around and asked if she had contacted restaurant to see if anyone else had any problems that evening and she had not. Informed her that she should stay hydrated and slowly introduce foods back into her diet.  She would like to know if there are any other suggestions you may have.

## 2022-06-25 NOTE — Telephone Encounter (Signed)
Pt informed of below.       Velva Harman, PA  You; Junius Creamer, Heather E, NP20 minutes ago (2:46 PM)    That it is perfect advised, hydration is going to be the most important thing for her to prioritize right now.  She should also be following a BRAT diet and introducing new foods as she tolerates just as you suggested.  As long as she is not having any fever or blood in her stool, she could also try some Pepto-Bismol.  Also warn her that if she does decide to try the Pepto-Bismol, it can turn stool a dark color.

## 2022-06-26 ENCOUNTER — Ambulatory Visit
Admission: RE | Admit: 2022-06-26 | Discharge: 2022-06-26 | Disposition: A | Discharge status: Home / Self Care | Payer: Medicare HMO | Source: Ambulatory Visit | Attending: Urgent Care | Admitting: Urgent Care

## 2022-06-26 VITALS — BP 134/69 | HR 91 | Temp 98.7°F | Resp 19

## 2022-06-26 DIAGNOSIS — R109 Unspecified abdominal pain: Secondary | ICD-10-CM | POA: Diagnosis not present

## 2022-06-26 DIAGNOSIS — R1032 Left lower quadrant pain: Secondary | ICD-10-CM

## 2022-06-26 DIAGNOSIS — E1165 Type 2 diabetes mellitus with hyperglycemia: Secondary | ICD-10-CM | POA: Diagnosis not present

## 2022-06-26 DIAGNOSIS — R42 Dizziness and giddiness: Secondary | ICD-10-CM | POA: Diagnosis not present

## 2022-06-26 LAB — POCT URINALYSIS DIP (MANUAL ENTRY)
Bilirubin, UA: NEGATIVE
Blood, UA: NEGATIVE
Glucose, UA: 500 mg/dL — AB
Leukocytes, UA: NEGATIVE
Nitrite, UA: NEGATIVE
Protein Ur, POC: NEGATIVE mg/dL
Spec Grav, UA: 1.015 (ref 1.010–1.025)
Urobilinogen, UA: 0.2 E.U./dL
pH, UA: 5.5 (ref 5.0–8.0)

## 2022-06-26 LAB — POCT FASTING CBG KUC MANUAL ENTRY: POCT Glucose (KUC): 216 mg/dL — AB (ref 70–99)

## 2022-06-26 MED ORDER — MECLIZINE HCL 12.5 MG PO TABS: 12.5000 mg | ORAL_TABLET | Freq: Three times a day (TID) | ORAL | 0 refills | Status: DC | PRN

## 2022-06-26 MED ORDER — CIPROFLOXACIN HCL 500 MG PO TABS: 500.0000 mg | ORAL_TABLET | Freq: Two times a day (BID) | ORAL | 0 refills | Status: DC

## 2022-06-26 NOTE — ED Triage Notes (Signed)
Pt states she is having nausea, vomiting, dizziness, that started Friday. Pt states loss of appetite with increase frequency, lower abdominal cramping/pain, with burning when urinating.

## 2022-06-26 NOTE — ED Provider Notes (Addendum)
Roderic Palau    CSN: [id number] Arrival date & time: 06/26/22  1851      History   Chief Complaint Chief Complaint  Patient presents with   Nausea    Stomach issues - Entered by patient   Dizziness    HPI Lisa Gutierrez is a 72 y.o. female.    Dizziness   Presents with nausea, vomiting, dizziness starting Friday.  Endorses associated loss of appetite with increased urinary frequency, lower abdominal cramping and pain and dysuria.  PMH includes DM 2, recently under reasonable control per A1c 10 months ago.  She does not describe her feeling of dizziness as "room spinning", however she states she has difficulty standing up and needs to sit down.  She reports recently eating a lot of popcorn about 2 days before her symptoms started.  Past Medical History:  Diagnosis Date   Anxiety    Depression    Depression    Phreesia 07/02/2020   Diabetes mellitus without complication (HCC)    GERD (gastroesophageal reflux disease)    Hypertension    IBS (irritable bowel syndrome)    Neuromuscular disorder ([place])     Patient Active Problem List   Diagnosis Date Noted   Prurigo nodularis 01/01/2022   Acute pain of right shoulder 01/01/2022   Yeast infection 03/05/2021   Acute eczematoid otitis externa of right ear 01/03/2021   Chronic pain of left knee 01/03/2021   Body mass index (BMI) of 40.1-44.9 in adult ([place]) 01/03/2021   Cyst of right ovary 12/09/2020   Body mass index (BMI) of 37.0-37.9 in adult 10/04/2020   BMI 37.0-37.9, adult 10/03/2020   Encounter to establish care 07/05/2020   Statin intolerance 03/06/2020   Other fatigue 11/04/2019   History of COVID-19 11/04/2019   MDD (major depressive disorder), recurrent, in partial remission ([place]) 06/10/2019   Vertigo 05/22/2019   Exposure to COVID-19 virus 05/04/2019   Acute recurrent pansinusitis 03/18/2019   Chronic allergic rhinitis 03/18/2019   Irritable bowel syndrome with constipation 02/25/2019    MDD (major depressive disorder), recurrent episode, moderate ([place]) 01/30/2019   Marital relationship problem 01/30/2019   Acute non-recurrent pansinusitis 08/04/2018   Vaginal candidiasis 08/04/2018   Type 2 diabetes mellitus with hyperglycemia ([place]) 07/02/2018   Urinary tract infection without hematuria 07/02/2018   Acute upper respiratory infection 03/25/2018   Flu-like symptoms 03/25/2018   Cough 03/25/2018   Encounter for general adult medical examination with abnormal findings 02/06/2018   Type 2 diabetes mellitus with hyperglycemia, without long-term current use of insulin ([place]) 02/06/2018   Primary insomnia 02/06/2018   Uncontrolled type 2 diabetes mellitus with hyperglycemia ([place]) 12/06/2017   Essential hypertension 12/06/2017   Cutaneous candidiasis 12/06/2017   GAD (generalized anxiety disorder) 12/06/2017   Gastroesophageal reflux disease without esophagitis 12/06/2017   Dysuria 12/06/2017   Fatty liver disease, nonalcoholic 0000000   Chronic superficial gastritis without bleeding 11/23/2016   External hemorrhoids 11/06/2016   Other constipation 11/06/2016   RUQ pain 11/06/2016    Past Surgical History:  Procedure Laterality Date   BREAST EXCISIONAL BIOPSY     CESAREAN SECTION N/A    Phreesia 07/02/2020   CHOLECYSTECTOMY     EXCISION / BIOPSY BREAST / NIPPLE / DUCT Right 1973   duct removed   SPINE SURGERY N/A    Phreesia 07/02/2020   TUBAL LIGATION N/A    Phreesia 07/02/2020    OB History   No obstetric history on file.      Home  Medications    Prior to Admission medications   Medication Sig Start Date End Date Taking? Authorizing Provider  acetaminophen (TYLENOL) 500 MG tablet Take 2 tablets by mouth as needed.    [provider]  acetic acid-hydrocortisone (VOSOL-HC) OTIC solution Place 4 drops into the right ear 2 (two) times daily. 12/29/20   Ronnell Freshwater, NP  ALPRAZolam Duanne Moron) 0.5 MG tablet Take 0.5 mg by mouth 2 (two) times  daily as needed. 03/28/22   [provider]  Bioflavonoid Products (VITAMIN C) CHEW Chew by mouth. With vitamin D and elderberry    [provider]  busPIRone (BUSPAR) 5 MG tablet Take 1 tablet (5 mg total) by mouth 2 (two) times daily. 06/23/22 09/21/22  [name], MD  Calcium Carbonate (CALCIUM 500 PO) Take by mouth.    [provider]  doxycycline (VIBRA-TABS) 100 MG tablet Take 1 tablet (100 mg total) by mouth 2 (two) times daily. 04/05/22   Boscia, Greer Ee, NP  Flaxseed, Linseed, (FLAXSEED OIL PO) Take 1,500 mg by mouth 2 (two) times daily.    [provider]  fluconazole (DIFLUCAN) 150 MG tablet Take 1 tablet po QOD for 5 doses. Repeat treatment as needed 05/01/22   Ronnell Freshwater, NP  fluticasone (FLONASE) 50 MCG/ACT nasal spray Place 2 sprays into both nostrils daily. 04/23/22   [provider]  FLUZONE HIGH-DOSE QUADRIVALENT 0.7 ML SUSY  03/02/21   [provider]  glucose blood (ACCU-CHEK SMARTVIEW) test strip 1 each by Other route in the morning and at bedtime. Use as instructed 05/01/22   Ronnell Freshwater, NP  JANUMET 50-1000 MG tablet TAKE ONE TABLET BY MOUTH TWICE DAILY 03/29/22   Ronnell Freshwater, NP  JARDIANCE 10 MG TABS tablet TAKE 1 TABLET BY MOUTH EVERY DAY BEFORE BREAKFAST 06/26/21   Ronnell Freshwater, NP  LINZESS 145 MCG CAPS capsule TAKE 1 CAPSULE BY MOUTH EVERY DAY 11/09/21   Ronnell Freshwater, NP  meclizine (ANTIVERT) 25 MG tablet Take 1 tablet (25 mg total) by mouth 2 (two) times daily as needed for dizziness. 05/22/19   Ronnell Freshwater, NP  metoprolol succinate (TOPROL-XL) 25 MG 24 hr tablet TAKE 2 TABLETS BY MOUTH EVERY DAY 02/12/22   Ronnell Freshwater, NP  Multiple Vitamins-Minerals (MULTIVITAMIN ADULT PO) Take 1 tablet by mouth daily.    [provider]  nystatin (MYCOSTATIN/NYSTOP) powder APPLY TO AFFECTED AREA TWICE A DAY 04/24/22   Ronnell Freshwater, NP  nystatin ointment (MYCOSTATIN) APPLY TO AFFECTED AREA  TWICE A DAY 06/04/22   Ronnell Freshwater, NP  pantoprazole (PROTONIX) 40 MG tablet TAKE 1 TABLET BY MOUTH TWICE A DAY 07/10/21   Ronnell Freshwater, NP  sitaGLIPtin-metformin (JANUMET) 50-500 MG tablet Take 1 tablet by mouth 2 (two) times daily with a meal. 08/11/21   Boscia, Greer Ee, NP  telmisartan-hydrochlorothiazide (MICARDIS HCT) 80-25 MG tablet Take 1 tablet by mouth daily. 06/11/22   Ronnell Freshwater, NP  venlafaxine XR (EFFEXOR-XR) 150 MG 24 hr capsule Take 1 capsule (150 mg total) by mouth daily with breakfast. 05/26/22 08/24/22  [name], MD  venlafaxine XR (EFFEXOR-XR) 37.5 MG 24 hr capsule Take 1 capsule (37.5 mg total) by mouth daily with breakfast. Take a total of 187.5 mg daily. Take along with 150 mg cap 06/15/22 08/14/22  [name], MD    Family History Family History  Problem Relation Age of Onset   Diabetes Mother    Hypertension Mother  Coronary artery disease Father    Glaucoma Father    Breast cancer Neg Hx     Social History Social History   Tobacco Use   Smoking status: Former   Smokeless tobacco: Never  Substance Use Topics   Alcohol use: No    Alcohol/week: 0.0 standard drinks of alcohol   Drug use: Never     Allergies   Aspirin, Ibuprofen, Levofloxacin, Meloxicam, Morphine, Naprosyn  [naproxen], Nsaids, Quinolones, Robinul  [glycopyrrolate], Penicillin v potassium, and Sulfa antibiotics   Review of Systems Review of Systems  Neurological:  Positive for dizziness.     Physical Exam Triage Vital Signs ED Triage Vitals [06/26/22 1920]  Enc Vitals Group     BP 134/69     Pulse Rate 91     Resp 19     Temp 98.7 F (37.1 C)     Temp Source Oral     SpO2 95 %     Weight      Height      Head Circumference      Peak Flow      Pain Score      Pain Loc      Pain Edu?      Excl. in [place]?    No data found.  Updated Vital Signs BP 134/69 (BP Location: Right Arm)   Pulse 91   Temp 98.7 F (37.1 C) (Oral)   Resp 19   SpO2 95%    Visual Acuity Right Eye Distance:   Left Eye Distance:   Bilateral Distance:    Right Eye Near:   Left Eye Near:    Bilateral Near:     Physical Exam Vitals reviewed.  Constitutional:      Appearance: Normal appearance.  Abdominal:     General: Bowel sounds are increased.     Tenderness: There is abdominal tenderness in the left lower quadrant.  Neurological:     General: No focal deficit present.     Mental Status: She is alert and oriented to person, place, and time.  Psychiatric:        Mood and Affect: Mood normal.        Behavior: Behavior normal.      UC Treatments / Results  Labs (all labs ordered are listed, but only abnormal results are displayed) Labs Reviewed - No data to display  EKG   Radiology No results found.  Procedures Procedures (including critical care time)  Medications Ordered in UC Medications - No data to display  Initial Impression / Assessment and Plan / UC Course  I have reviewed the triage vital signs and the nursing notes.  Pertinent labs & imaging results that were available during my care of the patient were reviewed by me and considered in my medical decision making (see chart for details).   Patient is afebrile here without recent antipyretics. Satting well on room air. Overall is well appearing, well hydrated, without respiratory distress. Pulmonary exam is unremarkable.  Lungs CTAB without wheezing, rhonchi, rales.  TMs are nonerythematous.  She has significant cerumen in the EACs bilaterally.  Pharynx is not erythematous.  There is no peritonsillar exudate. Patient experiences profound dizziness with movement of laying down and sitting up.  Abdomen is soft with left lower quadrant tenderness.  Increased bowel sounds.  DDx includes acute viral process, diverticulitis, related symptoms of vertigo versus dizziness secondary to hypovolemia.  Given recent overindulgence and popcorn, possibly some diverticulitis flare and will  treat with a  course of Cipro.  Will attempt treatment of her dizziness with some meclizine, warning her regarding side effect of sedation and risk of falls.  And her to follow-up with her primary care provider in 1 to 2 weeks or sooner if necessary.  Discussed return precautions.  Final Clinical Impressions(s) / UC Diagnoses   Final diagnoses:  None   Discharge Instructions   None    ED Prescriptions   None    PDMP not reviewed this encounter.   Rose Phi, FNP 06/26/22 2000    Rose Phi, [place] 06/26/22 2000

## 2022-06-26 NOTE — Discharge Instructions (Addendum)
Follow up here or with your primary care provider if your symptoms are worsening or not improving.     

## 2022-06-29 ENCOUNTER — Observation Stay: Payer: Medicare HMO | Admitting: Anesthesiology

## 2022-06-29 ENCOUNTER — Encounter: Payer: Self-pay | Admitting: Ophthalmology

## 2022-07-03 DIAGNOSIS — H2511 Age-related nuclear cataract, right eye: Secondary | ICD-10-CM | POA: Diagnosis not present

## 2022-07-03 DIAGNOSIS — H2512 Age-related nuclear cataract, left eye: Secondary | ICD-10-CM | POA: Diagnosis not present

## 2022-07-05 NOTE — Discharge Instructions (Signed)

## 2022-07-10 ENCOUNTER — Ambulatory Visit: Payer: Medicare HMO

## 2022-07-10 ENCOUNTER — Encounter
Admission: RE | Disposition: A | Discharge status: Other Acute Inpatient Hospital | Payer: Self-pay | Source: Ambulatory Visit | Attending: Ophthalmology

## 2022-07-10 ENCOUNTER — Ambulatory Visit
Admission: RE | Admit: 2022-07-10 | Discharge: 2022-07-10 | Payer: Medicare HMO | Source: Ambulatory Visit | Attending: Ophthalmology | Admitting: Ophthalmology

## 2022-07-10 ENCOUNTER — Observation Stay (HOSPITAL_COMMUNITY)
Admit: 2022-07-10 | Discharge: 2022-07-10 | Disposition: A | Discharge status: Home / Self Care | Payer: Medicare HMO | Attending: Cardiology | Admitting: Cardiology

## 2022-07-10 ENCOUNTER — Other Ambulatory Visit: Payer: Self-pay

## 2022-07-10 ENCOUNTER — Inpatient Hospital Stay
Admission: EM | Admit: 2022-07-10 | Discharge: 2022-07-12 | DRG: 310 | Disposition: A | Discharge status: Home / Self Care | Payer: Medicare HMO | Attending: Obstetrics and Gynecology | Admitting: Obstetrics and Gynecology

## 2022-07-10 ENCOUNTER — Encounter: Payer: Self-pay | Admitting: Ophthalmology

## 2022-07-10 DIAGNOSIS — I952 Hypotension due to drugs: Secondary | ICD-10-CM

## 2022-07-10 DIAGNOSIS — Z79899 Other long term (current) drug therapy: Secondary | ICD-10-CM

## 2022-07-10 DIAGNOSIS — I959 Hypotension, unspecified: Secondary | ICD-10-CM

## 2022-07-10 DIAGNOSIS — Z83511 Family history of glaucoma: Secondary | ICD-10-CM

## 2022-07-10 DIAGNOSIS — E1165 Type 2 diabetes mellitus with hyperglycemia: Secondary | ICD-10-CM | POA: Diagnosis not present

## 2022-07-10 DIAGNOSIS — Z882 Allergy status to sulfonamides status: Secondary | ICD-10-CM

## 2022-07-10 DIAGNOSIS — Z87891 Personal history of nicotine dependence: Secondary | ICD-10-CM

## 2022-07-10 DIAGNOSIS — I152 Hypertension secondary to endocrine disorders: Secondary | ICD-10-CM | POA: Diagnosis present

## 2022-07-10 DIAGNOSIS — K5909 Other constipation: Secondary | ICD-10-CM | POA: Diagnosis present

## 2022-07-10 DIAGNOSIS — I4891 Unspecified atrial fibrillation: Principal | ICD-10-CM

## 2022-07-10 DIAGNOSIS — Z833 Family history of diabetes mellitus: Secondary | ICD-10-CM

## 2022-07-10 DIAGNOSIS — Z886 Allergy status to analgesic agent status: Secondary | ICD-10-CM

## 2022-07-10 DIAGNOSIS — Z880 Allergy status to penicillin: Secondary | ICD-10-CM

## 2022-07-10 DIAGNOSIS — F419 Anxiety disorder, unspecified: Secondary | ICD-10-CM | POA: Diagnosis present

## 2022-07-10 DIAGNOSIS — Z8249 Family history of ischemic heart disease and other diseases of the circulatory system: Secondary | ICD-10-CM

## 2022-07-10 DIAGNOSIS — R000 Tachycardia, unspecified: Secondary | ICD-10-CM | POA: Diagnosis not present

## 2022-07-10 DIAGNOSIS — Z6832 Body mass index (BMI) 32.0-32.9, adult: Secondary | ICD-10-CM

## 2022-07-10 DIAGNOSIS — R69 Illness, unspecified: Secondary | ICD-10-CM | POA: Diagnosis not present

## 2022-07-10 DIAGNOSIS — Z01810 Encounter for preprocedural cardiovascular examination: Secondary | ICD-10-CM | POA: Diagnosis not present

## 2022-07-10 DIAGNOSIS — K219 Gastro-esophageal reflux disease without esophagitis: Secondary | ICD-10-CM | POA: Diagnosis present

## 2022-07-10 DIAGNOSIS — R079 Chest pain, unspecified: Secondary | ICD-10-CM | POA: Diagnosis not present

## 2022-07-10 DIAGNOSIS — I10 Essential (primary) hypertension: Secondary | ICD-10-CM

## 2022-07-10 DIAGNOSIS — H2511 Age-related nuclear cataract, right eye: Secondary | ICD-10-CM | POA: Diagnosis not present

## 2022-07-10 DIAGNOSIS — Z881 Allergy status to other antibiotic agents status: Secondary | ICD-10-CM

## 2022-07-10 DIAGNOSIS — F3341 Major depressive disorder, recurrent, in partial remission: Secondary | ICD-10-CM

## 2022-07-10 DIAGNOSIS — Z7901 Long term (current) use of anticoagulants: Secondary | ICD-10-CM

## 2022-07-10 DIAGNOSIS — E669 Obesity, unspecified: Secondary | ICD-10-CM | POA: Diagnosis present

## 2022-07-10 DIAGNOSIS — Z7984 Long term (current) use of oral hypoglycemic drugs: Secondary | ICD-10-CM

## 2022-07-10 DIAGNOSIS — Z885 Allergy status to narcotic agent status: Secondary | ICD-10-CM

## 2022-07-10 DIAGNOSIS — E1136 Type 2 diabetes mellitus with diabetic cataract: Secondary | ICD-10-CM | POA: Diagnosis present

## 2022-07-10 LAB — BASIC METABOLIC PANEL
Anion gap: 16 — ABNORMAL HIGH (ref 5–15)
BUN: 12 mg/dL (ref 8–23)
CO2: 25 mmol/L (ref 22–32)
Calcium: 9.9 mg/dL (ref 8.9–10.3)
Chloride: 96 mmol/L — ABNORMAL LOW (ref 98–111)
Creatinine, Ser: 0.67 mg/dL (ref 0.44–1.00)
GFR, Estimated: >60 mL/min (ref >60)
Glucose, Bld: 175 mg/dL — ABNORMAL HIGH (ref 70–99)
Potassium: 3.6 mmol/L (ref 3.5–5.1)
Sodium: 137 mmol/L (ref 135–145)

## 2022-07-10 LAB — TROPONIN I (HIGH SENSITIVITY)
Troponin I (High Sensitivity): 6 ng/L (ref <18)
Troponin I (High Sensitivity): 5 ng/L (ref <18)

## 2022-07-10 LAB — CBC
HCT: 46.8 % — ABNORMAL HIGH (ref 36.0–46.0)
Hemoglobin: 15.1 g/dL — ABNORMAL HIGH (ref 12.0–15.0)
MCH: 29.7 pg (ref 26.0–34.0)
MCHC: 32.3 g/dL (ref 30.0–36.0)
MCV: 91.9 fL (ref 80.0–100.0)
Platelets: 225 10*3/uL (ref 150–400)
RBC: 5.09 MIL/uL (ref 3.87–5.11)
RDW: 12.9 % (ref 11.5–15.5)
WBC: 8.5 10*3/uL (ref 4.0–10.5)
nRBC: 0 % (ref 0.0–0.2)

## 2022-07-10 LAB — HEPARIN LEVEL (UNFRACTIONATED): Heparin Unfractionated: <0.1 IU/mL — ABNORMAL LOW (ref 0.30–0.70)

## 2022-07-10 LAB — CBG MONITORING, ED
Glucose-Capillary: 124 mg/dL — ABNORMAL HIGH (ref 70–99)
Glucose-Capillary: 148 mg/dL — ABNORMAL HIGH (ref 70–99)

## 2022-07-10 LAB — APTT: aPTT: 27 seconds (ref 24–36)

## 2022-07-10 LAB — PROTIME-INR
INR: 1.1 (ref 0.8–1.2)
Prothrombin Time: 13.7 seconds (ref 11.4–15.2)

## 2022-07-10 LAB — MAGNESIUM: Magnesium: 2.1 mg/dL (ref 1.7–2.4)

## 2022-07-10 LAB — TSH: TSH: 0.908 u[IU]/mL (ref 0.350–4.500)

## 2022-07-10 SURGERY — PHACOEMULSIFICATION, CATARACT, WITH IOL INSERTION
Anesthesia: Topical | Laterality: Right

## 2022-07-10 MED ORDER — ACETAMINOPHEN 325 MG PO TABS: 650.0000 mg | ORAL_TABLET | ORAL | Status: DC | PRN

## 2022-07-10 MED ORDER — DIGOXIN 0.25 MG/ML IJ SOLN
0.2500 mg | INTRAMUSCULAR | Status: AC
  Administered 2022-07-10: 0.25 mg via INTRAVENOUS
  Filled 2022-07-10 (×2): qty 2

## 2022-07-10 MED ORDER — METOPROLOL TARTRATE 5 MG/5ML IV SOLN
2.5000 mg | Freq: Once | INTRAVENOUS | Status: AC
  Administered 2022-07-10: 2.5 mg via INTRAVENOUS
  Filled 2022-07-10: qty 5

## 2022-07-10 MED ORDER — DILTIAZEM HCL-DEXTROSE 125-5 MG/125ML-% IV SOLN (PREMIX)
2.5000 mg/h | INTRAVENOUS | Status: DC
  Administered 2022-07-10 (×2): 5 mg/h via INTRAVENOUS
  Administered 2022-07-11: 7.5 mg/h via INTRAVENOUS
  Administered 2022-07-12: 5 mg/h via INTRAVENOUS
  Filled 2022-07-10 (×3): qty 125

## 2022-07-10 MED ORDER — DIGOXIN 250 MCG PO TABS
0.2500 mg | ORAL_TABLET | Freq: Every day | ORAL | Status: DC
  Administered 2022-07-11 – 2022-07-12 (×2): 0.25 mg via ORAL
  Filled 2022-07-10 (×3): qty 1

## 2022-07-10 MED ORDER — DIGOXIN 0.25 MG/ML IJ SOLN
0.2500 mg | Freq: Once | INTRAMUSCULAR | Status: AC
  Administered 2022-07-10: 0.25 mg via INTRAVENOUS
  Filled 2022-07-10: qty 2

## 2022-07-10 MED ORDER — METOPROLOL SUCCINATE ER 25 MG PO TB24
25.0000 mg | ORAL_TABLET | ORAL | Status: AC
  Administered 2022-07-10: 25 mg via ORAL
  Filled 2022-07-10: qty 1

## 2022-07-10 MED ORDER — DILTIAZEM HCL-DEXTROSE 125-5 MG/125ML-% IV SOLN (PREMIX): 5.0000 mg/h | INTRAVENOUS | Status: DC

## 2022-07-10 MED ORDER — VENLAFAXINE HCL ER 75 MG PO CP24
150.0000 mg | ORAL_CAPSULE | Freq: Every day | ORAL | Status: DC
  Administered 2022-07-11 – 2022-07-12 (×2): 150 mg via ORAL
  Filled 2022-07-10 (×2): qty 2
  Filled 2022-07-10: qty 1

## 2022-07-10 MED ORDER — DOCUSATE SODIUM 100 MG PO CAPS
100.0000 mg | ORAL_CAPSULE | Freq: Every day | ORAL | Status: DC
  Administered 2022-07-10 – 2022-07-11 (×2): 100 mg via ORAL
  Filled 2022-07-10 (×2): qty 1

## 2022-07-10 MED ORDER — BUSPIRONE HCL 10 MG PO TABS
5.0000 mg | ORAL_TABLET | Freq: Two times a day (BID) | ORAL | Status: DC
  Administered 2022-07-10 – 2022-07-12 (×4): 5 mg via ORAL
  Filled 2022-07-10 (×4): qty 1

## 2022-07-10 MED ORDER — INSULIN ASPART 100 UNIT/ML IJ SOLN
0.0000 [IU] | Freq: Three times a day (TID) | INTRAMUSCULAR | Status: DC
  Administered 2022-07-10: 2 [IU] via SUBCUTANEOUS
  Administered 2022-07-11 – 2022-07-12 (×2): 3 [IU] via SUBCUTANEOUS
  Administered 2022-07-12: 2 [IU] via SUBCUTANEOUS
  Filled 2022-07-10 (×3): qty 1

## 2022-07-10 MED ORDER — HEPARIN (PORCINE) 25000 UT/250ML-% IV SOLN
1450.0000 [IU]/h | INTRAVENOUS | Status: DC
  Administered 2022-07-10: 1150 [IU]/h via INTRAVENOUS
  Administered 2022-07-11: 1450 [IU]/h via INTRAVENOUS
  Filled 2022-07-10 (×2): qty 250

## 2022-07-10 MED ORDER — SODIUM CHLORIDE 0.9 % IV BOLUS
500.0000 mL | Freq: Once | INTRAVENOUS | Status: AC
  Administered 2022-07-10: 500 mL via INTRAVENOUS

## 2022-07-10 MED ORDER — LINACLOTIDE 145 MCG PO CAPS
145.0000 ug | ORAL_CAPSULE | Freq: Every day | ORAL | Status: DC
  Administered 2022-07-11 – 2022-07-12 (×2): 145 ug via ORAL
  Filled 2022-07-10 (×3): qty 1

## 2022-07-10 MED ORDER — ALPRAZOLAM 0.25 MG PO TABS
0.2500 mg | ORAL_TABLET | Freq: Two times a day (BID) | ORAL | Status: DC | PRN
  Administered 2022-07-10 – 2022-07-12 (×3): 0.25 mg via ORAL
  Filled 2022-07-10 (×3): qty 1

## 2022-07-10 MED ORDER — ALPRAZOLAM 0.5 MG PO TABS
0.5000 mg | ORAL_TABLET | Freq: Once | ORAL | Status: AC
  Administered 2022-07-10: 0.5 mg via ORAL
  Filled 2022-07-10: qty 1

## 2022-07-10 MED ORDER — PANTOPRAZOLE SODIUM 40 MG PO TBEC
40.0000 mg | DELAYED_RELEASE_TABLET | Freq: Two times a day (BID) | ORAL | Status: DC
  Administered 2022-07-10 – 2022-07-12 (×4): 40 mg via ORAL
  Filled 2022-07-10 (×4): qty 1

## 2022-07-10 MED ORDER — HEPARIN BOLUS VIA INFUSION
4000.0000 [IU] | Freq: Once | INTRAVENOUS | Status: AC
  Administered 2022-07-10: 4000 [IU] via INTRAVENOUS
  Filled 2022-07-10: qty 4000

## 2022-07-10 MED ORDER — ONDANSETRON HCL 4 MG/2ML IJ SOLN: 4.0000 mg | Freq: Four times a day (QID) | INTRAMUSCULAR | Status: DC | PRN

## 2022-07-10 MED ORDER — VENLAFAXINE HCL ER 37.5 MG PO CP24
37.5000 mg | ORAL_CAPSULE | Freq: Every day | ORAL | Status: DC
  Administered 2022-07-11 – 2022-07-12 (×2): 37.5 mg via ORAL
  Filled 2022-07-10 (×3): qty 1

## 2022-07-10 SURGICAL SUPPLY — 11 items
CANNULA ANT/CHMB 27GA (MISCELLANEOUS) IMPLANT
CATARACT SUITE SIGHTPATH (MISCELLANEOUS) ×1 IMPLANT
GLOVE BIOGEL PI IND STRL 8 (GLOVE) ×1 IMPLANT
GLOVE SURG ENC TEXT LTX SZ8 (GLOVE) ×1 IMPLANT
NEEDLE FILTER BLUNT 18X1 1/2 (NEEDLE) ×1 IMPLANT
PACK VIT ANT 23G (MISCELLANEOUS) IMPLANT
RING MALYGIN (MISCELLANEOUS) IMPLANT
SUT ETHILON 10-0 CS-B-6CS-B-6 (SUTURE)
SUTURE EHLN 10-0 CS-B-6CS-B-6 (SUTURE) IMPLANT
SYR 3ML LL SCALE MARK (SYRINGE) ×1 IMPLANT
WATER STERILE IRR 250ML POUR (IV SOLUTION) ×1 IMPLANT

## 2022-07-10 NOTE — ED Provider Notes (Signed)
[facility] Provider Note    Event Date/Time   First MD Initiated Contact with Patient 07/10/22 1015     (approximate)   History   Irregular Heart Beat   HPI  Lisa Gutierrez is a 72 y.o. female history of type 2 diabetes, hypertension  Presenting to preop noted by anesthesia to have tachydysrhythmia.  Likely A-fib   She denies having any symptoms whatsoever no chest pain no lightheadedness no shortness of breath.  She thought she was going to get her cataracts come pleated today when not in anesthesia told her she could not have it done and need to go to the ER for high heart rate  Physical Exam   Triage Vital Signs: ED Triage Vitals  Enc Vitals Group     BP 07/10/22 1005 (!) 149/113     Pulse Rate 07/10/22 1005 (!) 184     Resp 07/10/22 1005 20     Temp --      Temp src --      SpO2 07/10/22 1005 100 %     Weight --      Height --      Head Circumference --      Peak Flow --      Pain Score 07/10/22 1006 0     Pain Loc --      Pain Edu? --      Excl. in [place]? --     Most recent vital signs: Vitals:   07/11/22 1400 07/11/22 1547  BP: 110/74 104/75  Pulse: (!) 137 89  Resp: 16 17  Temp:    SpO2: 94% 94%     General: Awake, no distress.  Mild hypotension. CV:  Good peripheral perfusion.  Very rapid tachydysrhythmia irregular Resp:  Normal effort.  Clear bilaterally normal work of breathing Abd:  No distention.  Soft nontender nondistended Other:  .  Euvolemic no edema noted   ED Results / Procedures / Treatments   Labs (all labs ordered are listed, but only abnormal results are displayed) Labs Reviewed  BASIC METABOLIC PANEL - Abnormal; Notable for the following components:      Result Value   Chloride 96 (*)    Glucose, Bld 175 (*)    Anion gap 16 (*)    All other components within normal limits  CBC - Abnormal; Notable for the following components:   Hemoglobin 15.1 (*)    HCT 46.8 (*)    All other components  within normal limits  HEPARIN LEVEL (UNFRACTIONATED) - Abnormal; Notable for the following components:   Heparin Unfractionated <0.10 (*)    All other components within normal limits  HEMOGLOBIN A1C - Abnormal; Notable for the following components:   Hgb A1c MFr Bld 7.5 (*)    All other components within normal limits  BASIC METABOLIC PANEL - Abnormal; Notable for the following components:   Glucose, Bld 218 (*)    All other components within normal limits  HEPARIN LEVEL (UNFRACTIONATED) - Abnormal; Notable for the following components:   Heparin Unfractionated 0.28 (*)    All other components within normal limits  GLUCOSE, CAPILLARY - Abnormal; Notable for the following components:   Glucose-Capillary 151 (*)    All other components within normal limits  CBG MONITORING, ED - Abnormal; Notable for the following components:   Glucose-Capillary 124 (*)    All other components within normal limits  CBG MONITORING, ED - Abnormal; Notable for the following components:   Glucose-Capillary  148 (*)    All other components within normal limits  TSH  APTT  PROTIME-INR  MAGNESIUM  CBC  MAGNESIUM  TROPONIN I (HIGH SENSITIVITY)  TROPONIN I (HIGH SENSITIVITY)     EKG  And interpreted by me at 1010 heart rate 160 QRS 80 QTc 460 Atrial fibrillation with rapid ventricular rate without obvious ischemia   RADIOLOGY   Chest x-ray interpreted as mild vascular congestion.  Radiologist also noted a mildly enlarged cardiac silhouette      PROCEDURES:  Critical Care performed: Yes, see critical care procedure note(s)  CRITICAL CARE Performed by: Delman Kitten   Total critical care time: 25 minutes  Critical care time was exclusive of separately billable procedures and treating other patients.  Critical care was necessary to treat or prevent imminent or life-threatening deterioration.  Critical care was time spent personally by me on the following activities: development of treatment  plan with patient and/or surrogate as well as nursing, discussions with consultants, evaluation of patient's response to treatment, examination of patient, obtaining history from patient or surrogate, ordering and performing treatments and interventions, ordering and review of laboratory studies, ordering and review of radiographic studies, pulse oximetry and re-evaluation of patient's condition.   Procedures   MEDICATIONS ORDERED IN ED: Medications  diltiazem (CARDIZEM) 125 mg in dextrose 5% 125 mL (1 mg/mL) infusion (5 mg/hr Intravenous Infusion Verify 07/11/22 1600)  acetaminophen (TYLENOL) tablet 650 mg (has no administration in time range)  ondansetron (ZOFRAN) injection 4 mg (has no administration in time range)  ALPRAZolam (XANAX) tablet 0.25 mg (0.25 mg Oral Given 07/11/22 0437)  venlafaxine XR (EFFEXOR-XR) 24 hr capsule 150 mg (150 mg Oral Given 07/11/22 0840)  venlafaxine XR (EFFEXOR-XR) 24 hr capsule 37.5 mg (37.5 mg Oral Given 07/11/22 1010)  insulin aspart (novoLOG) injection 0-15 Units ( Subcutaneous Not Given 07/11/22 1230)  digoxin (LANOXIN) 0.25 MG/ML injection 0.25 mg ( Intravenous Canceled Entry 07/10/22 1942)  digoxin (LANOXIN) tablet 0.25 mg (0.25 mg Oral Given 07/11/22 1010)  busPIRone (BUSPAR) tablet 5 mg (5 mg Oral Given 07/11/22 0840)  docusate sodium (COLACE) capsule 100 mg (100 mg Oral Given 07/10/22 2139)  pantoprazole (PROTONIX) EC tablet 40 mg (40 mg Oral Given 07/11/22 0841)  linaclotide (LINZESS) capsule 145 mcg (145 mcg Oral Given 07/11/22 1010)  metoprolol tartrate (LOPRESSOR) tablet 25 mg (25 mg Oral Given 07/11/22 1020)  ALPRAZolam (XANAX) tablet 0.5 mg (0.5 mg Oral Given 07/10/22 1150)  metoprolol tartrate (LOPRESSOR) injection 2.5 mg (2.5 mg Intravenous Given 07/10/22 1144)  metoprolol succinate (TOPROL-XL) 24 hr tablet 25 mg (25 mg Oral Given 07/10/22 1145)  sodium chloride 0.9 % bolus 500 mL (500 mLs Intravenous Bolus 07/10/22 1404)  heparin bolus via infusion 4,000  Units (4,000 Units Intravenous Bolus from Bag 07/10/22 1454)  digoxin (LANOXIN) 0.25 MG/ML injection 0.25 mg (0.25 mg Intravenous Given 07/10/22 2142)  heparin bolus via infusion 2,350 Units (2,350 Units Intravenous Bolus from Bag 07/11/22 0102)  magnesium sulfate IVPB 1 g 100 mL (1 g Intravenous New Bag/Given 07/11/22 1027)  potassium chloride SA (KLOR-CON M) CR tablet 60 mEq (60 mEq Oral Given 07/11/22 1020)     IMPRESSION / MDM / ASSESSMENT AND PLAN / ED COURSE  I reviewed the triage vital signs and the nursing notes.                              Differential diagnosis includes, but is not limited to, asymptomatic  atrial fibrillation with rapid ventricular response, metabolic abnormality, cardiac ischemia, valvular abnormality, CHF cardiomyopathy etc.  Differential diagnosis quite broad but apparently the patient is quite asymptomatic to this.  Initially tried to use a little pressor IV and home metoprolol but no rate control obtained.  After the patient became mildly hypotensive which was responsive to fluid bolus consult with her cardiologist who advised to initiate patient on amiodarone  Patient to be admitted to the hospitalist consulted with the hospitalist Dr. Charleen Kirks  Patient's presentation is most consistent with acute presentation with potential threat to life or bodily function.   The patient is on the cardiac monitor to evaluate for evidence of arrhythmia and/or significant heart rate changes.  Labs interpreted as normal metabolic panel with exception to mildly elevated glucose and slightly elevated anion gap.  CBC with a mildly elevated hemoglobin      FINAL CLINICAL IMPRESSION(S) / ED DIAGNOSES   Final diagnoses:  Atrial fibrillation, rapid ([place])     Rx / DC Orders   ED Discharge Orders     None        Note:  This document was prepared using Dragon voice recognition software and may include unintentional dictation errors.   Delman Kitten, MD 07/11/22 [phone]

## 2022-07-10 NOTE — Assessment & Plan Note (Signed)
>>  ASSESSMENT AND PLAN FOR UNCONTROLLED TYPE 2 DIABETES MELLITUS WITH HYPERGLYCEMIA (HCC) WRITTEN ON 07/10/2022  2:26 PM BY Verdene Lennert, MD  - Hold home antiglycemic agents - SSI, moderate

## 2022-07-10 NOTE — Progress Notes (Signed)
Patient with heart rate in the 170s-190s, difficult to tell rhythm at that rate, but seems like atrial fibrillation with rapid ventricular response on EKG. No prior diagnosis of a fib. Patient is asymptomatic, denies palpitations, chest pain, headache, blurry vision.   Husband at bedside. I expressed the gravity of the situation to both the patient and the husband. I strongly recommended evaluation in the emergency department. Husband understands and says he will drive her immediately to the [facility] ED. Given that patient is completely asymptomatic, I do not believe it prudent to call EMS to transport her, especially since her husband is here with transportation, and both understand the potential gravity of the situation. Husband provided with a copy of the EKG to show the ED.  Cataract surgery to be postponed.

## 2022-07-10 NOTE — ED Notes (Signed)
Pt currently in rm 11, This RN on the phone with [facility] to get pt moved off board to be able to "move" pt into rm 11

## 2022-07-10 NOTE — Consult Note (Signed)
[place] for heparin drip Indication: atrial fibrillation  Allergies  Allergen Reactions   Aspirin Other (See Comments)    GI upset   Ibuprofen Other (See Comments)    GERD   Levofloxacin Other (See Comments)    Weight gain   Meloxicam Other (See Comments)    GI upset   Morphine Other (See Comments)   Naprosyn  [Naproxen] Other (See Comments)    GI upset   Nsaids Other (See Comments)    GI upset   Propofol Other (See Comments)    Had horrible nightmares   Quinolones    Robinul  [Glycopyrrolate] Nausea And Vomiting   Penicillin V Potassium Rash   Sulfa Antibiotics Rash    Patient Measurements: Height: 5\' 6"  (167.6 cm) IBW/kg (Calculated) : 59.3 Heparin Dosing Weight: 78.8 kg  Vital Signs: Temp: 98 F (36.7 C) (03/26 1030) Temp Source: Oral (03/26 1030) BP: 100/87 (03/26 1315) Pulse Rate: 159 (03/26 1315)  Labs: Recent Labs    07/10/22 1017  HGB 15.1*  HCT 46.8*  PLT 225  CREATININE 0.67  TROPONINIHS 6    Estimated Creatinine Clearance: 73.2 mL/min (by C-G formula based on SCr of 0.67 mg/dL).   Medical History: Past Medical History:  Diagnosis Date   Anxiety    Depression    Depression    Phreesia 07/02/2020   Diabetes mellitus without complication (HCC)    GERD (gastroesophageal reflux disease)    Hypertension    IBS (irritable bowel syndrome)    Neuromuscular disorder (HCC)     Medications:  No home anticoagulation per pharmacist review  Assessment: 72 yo female sent to ED from [facility] surgical center due to rapid heart rate.  In the ED patient found to be in Afib.  Pharmacy consulted to start heparin drip.  Baseline labs: hgb 15.1, hct 46.8, plt 225, INR pending, aPTT pending  Goal of Therapy:  Heparin level 0.3-0.7 units/ml Monitor platelets by anticoagulation protocol: Yes   Plan:  Give 4000 units bolus x 1 Start heparin infusion at 1150 units/hr Check anti-Xa level in 8 hours and daily while on  heparin Continue to monitor H&H and platelets  Lorin Picket, PharmD 07/10/2022,1:34 PM

## 2022-07-10 NOTE — ED Notes (Addendum)
Spoke with MD as far as pt vitals and dilt medication MD wanted rate to be titrated up from 5 to 7.5 mg/hr.

## 2022-07-10 NOTE — ED Notes (Signed)
Pt requested PRN xanax due to some anxiety and trying to rest tonight.

## 2022-07-10 NOTE — ED Triage Notes (Signed)
Pt to ED from [facility] surgery center, was going to have cataract surgery today and was told to come to ED for rapid HR. Denies hx of same.  Pt appears very nervous.  Alert and oriented. NAD noted.

## 2022-07-10 NOTE — ED Notes (Addendum)
Pt family leaving bedside. Spoke with MD to titrate medication upwards and allow patient some food. Pt up to toilet with steady gait and one assist.

## 2022-07-10 NOTE — Assessment & Plan Note (Signed)
-   Hold home antiglycemic agents - SSI, moderate 

## 2022-07-10 NOTE — ED Notes (Signed)
Pt presents to ED with c/o of being told she was having a rapid heart rate, pt states she was suppose to have cataract surgery today but was advised to come to ED to further evaluate the rapid heart rate. Pt denies any cardiac HX at this time. Pt denies SOB or CP to this RN.   Pt denies any recent illness other than COVID in November.   Pt is tearful due to being scared, pt reassured she will be well taken care off, pt states some relief of stress at this time.   EKG done, IV placed, MD aware.

## 2022-07-10 NOTE — Assessment & Plan Note (Signed)
In the setting of of IV and p.o. metoprolol, in addition to A-fib with RVR.  - Hold home antihypertensives - S/p 500 cc bolus

## 2022-07-10 NOTE — Assessment & Plan Note (Signed)
-   Continue venlafaxine, as needed Xanax and BuSpar

## 2022-07-10 NOTE — Consult Note (Signed)
Cardiology Consultation   Patient ID: Lisa Gutierrez MRN: [id number]; DOB: 04-20-1950  Admit date: 07/10/2022 Date of Consult: 07/10/2022  PCP:  Ronnell Freshwater, NP   [facility] Prov[facility] Cardiologist:  None      New Consult done by Dr Fletcher Anon  Patient Profile:   Lisa Gutierrez is a 72 y.o. female with a hx of essential hypertension, chronic allergic rhinitis, gastroesophageal reflux disease, irritable bowel syndrome with constipation, chronic superficial gastritis without bleeding, fatty liver disease that is nonalcoholic, type 2 diabetes, generalized anxiety disorder, insomnia, vertigo, history of statin intolerance, morbid obesity, who is being seen 07/10/2022 for the evaluation of atrial fibrillation RVR at the request of Dr. Jacqualine Code.  History of Present Illness:   [name] is a 72 year old female with previously mentioned past medical history.  Patient presented today for cataract surgery and was evaluated by anesthesia.  Anesthesia noted the heart rate was 170s and 190s questionable atrial fibrillation with RVR on EKG.  The patient had no prior diagnoses of A-fib.  Patient was asymptomatic denies palpitations chest pain headache or blurry vision.  At that time Helyn App advised the patient and her husband was at the bedside that she needed to be evaluated in the emergency department.  The patient arrived to the [facility] emergency department from the [facility] surgery center.  On arrival she remained a symptomatic and was advised that she was sent to follow-up on an irregular heartbeat likely atrial fibrillation.  This was at the recommendation of anesthesia.  She continued to deny chest pain, shortness of breath, palpitations, dizziness or lightheadedness.  She does endorse anxiety and is nervous.  Husband remains at the bedside.  She denies any previous cardiac history.  Initial vital signs: Blood pressure 149/113, pulse of 184, respirations of 20  Pertinent  labs: Sodium 137, potassium 3.6, chloride 96, CO2 25, blood glucose of 175, BUN 12, serum creatinine 0.67, with a GFR greater than 60, hemoglobin 15.1, hematocrit of 46.8, high-sensitivity troponin of 6, TSH 0.908  Imaging: Chest x-ray reveals enlarged cardiac silhouette, mild central vascular prominence without overt edema  Cardiology consulted for new onset atrial fibrillation RVR with recommendations for diltiazem drip, heparin drip, and echocardiogram.  Past Medical History:  Diagnosis Date   Anxiety    Depression    Depression    Phreesia 07/02/2020   Diabetes mellitus without complication (HCC)    GERD (gastroesophageal reflux disease)    Hypertension    IBS (irritable bowel syndrome)    Neuromuscular disorder [facility])     Past Surgical History:  Procedure Laterality Date   BREAST EXCISIONAL BIOPSY     CESAREAN SECTION N/A    Phreesia 07/02/2020   CHOLECYSTECTOMY     EXCISION / BIOPSY BREAST / NIPPLE / DUCT Right 1973   duct removed   SPINE SURGERY N/A    Phreesia 07/02/2020   TUBAL LIGATION N/A    Phreesia 07/02/2020     Home Medications:  Prior to Admission medications   Medication Sig Start Date End Date Taking? Authorizing Provider  acetaminophen (TYLENOL) 500 MG tablet Take 2 tablets by mouth as needed.    [provider]  acetic acid-hydrocortisone (VOSOL-HC) OTIC solution Place 4 drops into the right ear 2 (two) times daily. Patient not taking: Reported on 06/29/2022 12/29/20   Ronnell Freshwater, NP  ALPRAZolam Duanne Moron) 0.5 MG tablet Take 0.5 mg by mouth 2 (two) times daily as needed. 03/28/22   [provider]  Bioflavonoid Products (  VITAMIN C) CHEW Chew by mouth. With vitamin D and elderberry    [provider]  busPIRone (BUSPAR) 5 MG tablet Take 1 tablet (5 mg total) by mouth 2 (two) times daily. 06/23/22 09/21/22  [name], MD  Calcium Carbonate (CALCIUM 500 PO) Take by mouth.    [provider]  ciprofloxacin (CIPRO) 500 MG  tablet Take 1 tablet (500 mg total) by mouth 2 (two) times daily. 06/26/22   Immordino, Annie Main, FNP  clobetasol ointment (TEMOVATE) 0.05 % Apply topically to affected area daily until improved. 06/06/22   [provider]  doxycycline (VIBRA-TABS) 100 MG tablet Take 1 tablet (100 mg total) by mouth 2 (two) times daily. Patient not taking: Reported on 06/29/2022 04/05/22   Ronnell Freshwater, NP  Flaxseed, Linseed, (FLAXSEED OIL PO) Take 1,500 mg by mouth 2 (two) times daily.    [provider]  fluconazole (DIFLUCAN) 150 MG tablet Take 1 tablet po QOD for 5 doses. Repeat treatment as needed Patient not taking: Reported on 06/29/2022 05/01/22   Ronnell Freshwater, NP  fluticasone (FLONASE) 50 MCG/ACT nasal spray Place 2 sprays into both nostrils daily. 04/23/22   [provider]  FLUZONE HIGH-DOSE QUADRIVALENT 0.7 ML SUSY  03/02/21   [provider]  glucose blood (ACCU-CHEK SMARTVIEW) test strip 1 each by Other route in the morning and at bedtime. Use as instructed 05/01/22   Ronnell Freshwater, NP  JANUMET 50-1000 MG tablet TAKE ONE TABLET BY MOUTH TWICE DAILY Patient not taking: Reported on 06/29/2022 03/29/22   Ronnell Freshwater, NP  JARDIANCE 10 MG TABS tablet TAKE 1 TABLET BY MOUTH EVERY DAY BEFORE BREAKFAST 06/26/21   Ronnell Freshwater, NP  LINZESS 145 MCG CAPS capsule TAKE 1 CAPSULE BY MOUTH EVERY DAY 11/09/21   Ronnell Freshwater, NP  meclizine (ANTIVERT) 12.5 MG tablet Take 1 tablet (12.5 mg total) by mouth 3 (three) times daily as needed for dizziness. 06/26/22   Immordino, Annie Main, FNP  metoprolol succinate (TOPROL-XL) 25 MG 24 hr tablet TAKE 2 TABLETS BY MOUTH EVERY DAY 02/12/22   Ronnell Freshwater, NP  Multiple Vitamins-Minerals (MULTIVITAMIN ADULT PO) Take 1 tablet by mouth daily.    [provider]  nystatin (MYCOSTATIN/NYSTOP) powder APPLY TO AFFECTED AREA TWICE A DAY Patient not taking: Reported on 06/29/2022 04/24/22   Ronnell Freshwater, NP  nystatin  ointment (MYCOSTATIN) APPLY TO AFFECTED AREA TWICE A DAY Patient not taking: Reported on 06/29/2022 06/04/22   Ronnell Freshwater, NP  pantoprazole (PROTONIX) 40 MG tablet TAKE 1 TABLET BY MOUTH TWICE A DAY 07/10/21   Ronnell Freshwater, NP  sitaGLIPtin-metformin (JANUMET) 50-500 MG tablet Take 1 tablet by mouth 2 (two) times daily with a meal. 08/11/21   Boscia, Greer Ee, NP  telmisartan-hydrochlorothiazide (MICARDIS HCT) 80-25 MG tablet Take 1 tablet by mouth daily. 06/11/22   Ronnell Freshwater, NP  venlafaxine XR (EFFEXOR-XR) 150 MG 24 hr capsule Take 1 capsule (150 mg total) by mouth daily with breakfast. 05/26/22 08/24/22  [name], MD  venlafaxine XR (EFFEXOR-XR) 37.5 MG 24 hr capsule Take 1 capsule (37.5 mg total) by mouth daily with breakfast. Take a total of 187.5 mg daily. Take along with 150 mg cap Patient not taking: Reported on 06/29/2022 06/15/22 08/14/22  [name], MD    Inpatient Medications: Scheduled Meds:  Continuous Infusions:  PRN Meds:   Allergies:    Allergies  Allergen Reactions   Aspirin Other (See Comments)  GI upset   Ibuprofen Other (See Comments)    GERD   Levofloxacin Other (See Comments)    Weight gain   Meloxicam Other (See Comments)    GI upset   Morphine Other (See Comments)   Naprosyn  [Naproxen] Other (See Comments)    GI upset   Nsaids Other (See Comments)    GI upset   Propofol Other (See Comments)    Had horrible nightmares   Quinolones    Robinul  [Glycopyrrolate] Nausea And Vomiting   Penicillin V Potassium Rash   Sulfa Antibiotics Rash    Social History:   Social History   Socioeconomic History   Marital status: Married    Spouse name: Not on file   Number of children: Not on file   Years of education: Not on file   Highest education level: Not on file  Occupational History   Not on file  Tobacco Use   Smoking status: Former   Smokeless tobacco: Never  Substance and Sexual Activity   Alcohol use: No    Alcohol/week:  0.0 standard drinks of alcohol   Drug use: Never   Sexual activity: Not Currently    Partners: Male  Other Topics Concern   Not on file  Social History Narrative   Not on file   Social Determinants of Health   Financial Resource Strain: Not on file  Food Insecurity: Not on file  Transportation Needs: Not on file  Physical Activity: Not on file  Stress: Not on file  Social Connections: Not on file  Intimate Partner Violence: Not on file    Family History:    Family History  Problem Relation Age of Onset   Diabetes Mother    Hypertension Mother    Coronary artery disease Father    Glaucoma Father    Breast cancer Neg Hx      ROS:  Please see the history of present illness.  Review of Systems  Psychiatric/Behavioral:  The patient is nervous/anxious.     All other ROS reviewed and negative.     Physical Exam/Data:   Vitals:   07/10/22 1100 07/10/22 1145 07/10/22 1157 07/10/22 1315  BP: (!) 115/95 110/88  100/87  Pulse: (!) 46 (!) 183  (!) 159  Resp: 18   (!) 21  Temp:      TempSrc:      SpO2: 98%   95%  Height:   5\' 6"  (1.676 m)    No intake or output data in the 24 hours ending 07/10/22 1359    06/29/2022    1:29 PM 01/01/2022    1:04 PM 08/24/2021   10:38 AM  Last 3 Weights  Weight (lbs) 200 lb 245 lb 245 lb 1.9 oz  Weight (kg) 90.719 kg 111.131 kg 111.186 kg     Body mass index is 32.28 kg/m.  General:  Well nourished, well developed, in no acute distress HEENT: normal Neck: no JVD Vascular: No carotid bruits; Distal pulses 2+ bilaterally Cardiac:  normal S1, S2; irregularly irregular; tachycardic, no murmur  Lungs:  clear to auscultation bilaterally, no wheezing, rhonchi or rales  Abd: soft, nontender, obese, no hepatomegaly  Ext: Trace pretibial edema Musculoskeletal:  No deformities, BUE and BLE strength normal and equal Skin: warm and dry  Neuro:  CNs 2-12 intact, no focal abnormalities noted Psych: She is extremely anxious and tearful  EKG:   The EKG was personally reviewed and demonstrates: Atrial fibrillation with RVR rates of 162 and a  right axis deviation Telemetry:  Telemetry was personally reviewed and demonstrates:  atrial fibrillation rates of 140-150  Relevant CV Studies: Echocardiogram ordered and pending  Laboratory Data:  High Sensitivity Troponin:   Recent Labs  Lab 07/10/22 1017  TROPONINIHS 6     Chemistry Recent Labs  Lab 07/10/22 1017  NA 137  K 3.6  CL 96*  CO2 25  GLUCOSE 175*  BUN 12  CREATININE 0.67  CALCIUM 9.9  GFRNONAA >60  ANIONGAP 16*    No results for input(s): "PROT", "ALBUMIN", "AST", "ALT", "ALKPHOS", "BILITOT" in the last 168 hours. Lipids No results for input(s): "CHOL", "TRIG", "HDL", "LABVLDL", "LDLCALC", "CHOLHDL" in the last 168 hours.  Hematology Recent Labs  Lab 07/10/22 1017  WBC 8.5  RBC 5.09  HGB 15.1*  HCT 46.8*  MCV 91.9  MCH 29.7  MCHC 32.3  RDW 12.9  PLT 225   Thyroid  Recent Labs  Lab 07/10/22 1017  TSH 0.908    BNPNo results for input(s): "BNP", "PROBNP" in the last 168 hours.  DDimer No results for input(s): "DDIMER" in the last 168 hours.   Radiology/Studies:  DG Chest Port 1 View  Result Date: 07/10/2022 CLINICAL DATA:  Chest pain, tachycardia EXAM: PORTABLE CHEST 1 VIEW COMPARISON:  None Available. FINDINGS: Single frontal view of the chest demonstrate an enlarged cardiac silhouette. There is mild central vascular prominence without focal airspace disease, effusion, or pneumothorax. No acute bony abnormalities. IMPRESSION: 1. Enlarged cardiac silhouette. 2. Mild central vascular prominence without overt edema. Electronically Signed   By: Randa Ngo M.D.   On: 07/10/2022 10:44     Assessment and Plan:   New onset atrial fibrillation RVR -Patient presented from outpatient surgery center admit been in new onset A-fib RVR -She remains asymptomatic -Currently still in A-fib RVR -Received metoprolol IVP -Starting on diltiazem  drip -Starting on heparin infusion for stroke prophylaxis -Will need to be likely transition to oral anticoagulant prior to discharge for CHA2DS2-VASc score of at least 4 -She remains in atrial fibrillation after rate control is achieved can consider DCCV after 4 weeks of uninterrupted anticoagulation -Echocardiogram ordered and pending further recommendations to follow -TSH within normal limits -No prior history of irregular heartbeats, palpitations, or family history of atrial fibrillation -Not a good candidate for bedside cardioversion unless she becomes unstable.  As she is asymptomatic at her preoperative appointment 3 weeks prior there was no finding of atrial fibrillation.  We discussed medications that she will be given, started, and indications for use of each 1 with questions answered.  Hypertension -Blood pressure 110/88 -PTA antihypertensives to remain on hold to allow for up titration of diltiazem, patient previously on metoprolol succinate 25 mg 2 tablets daily and telmisartan HCTZ 80/25 mg daily -Vital signs per unit protocol  Type 2 diabetes -PTA medications of June admitted and Jardiance on hold -Management per IM  Anxiety -Given 0.5 mg of Xanax in the emergency department -Recommend restarting PTA Xanax -Patient is extremely anxious and tearful during examination  Obesity -BMI 32.2 -Recommend weight management -Recommend dietary changes and increasing activity   Risk Assessment/Risk Scores:          CHA2DS2-VASc Score = 4   This indicates a 4.8% annual risk of stroke. The patient's score is based upon: CHF History: 0 HTN History: 1 Diabetes History: 1 Stroke History: 0 Vascular Disease History: 0 Age Score: 1 Gender Score: 1         For questions or updates, please contact  [facility] Please consult [email or web address] for contact info under    Signed, Jarmal Lewelling, NP  07/10/2022 1:59 PM

## 2022-07-10 NOTE — ED Notes (Addendum)
Pt A&Ox4. Dilt medication increased due to elevated HR from 5 mg to 10 mg MD notified. Pt ambulatory with assist to toilet due to unsteady gait. When using toilet pt removed IV with dilt. After talking MD will be restarting dilt at 5 mg.

## 2022-07-10 NOTE — ED Notes (Signed)
ECHO at patient bedside.

## 2022-07-10 NOTE — Assessment & Plan Note (Signed)
-   Hold home antihypertensives in the setting of low blood pressure

## 2022-07-10 NOTE — Assessment & Plan Note (Signed)
Patient presenting with atrial fibrillation with RVR, generally asymptomatic.  Patient notes intermittent dizziness that has been happening for a long time now but she was uncertain on etiology.  She received metoprolol in the ED, both IV and p.o., unfortunately patient's blood pressure has decreased.  Patient is asymptomatic from hypotension.  - Cardiology consulted; appreciate their recommendations - Echocardiogram ordered - Heparin drip per pharmacy protocol given possible need for cardioversion - Continue diltiazem infusion without bolus and titrate slowly - If unable to tolerate, will need cardioversion

## 2022-07-10 NOTE — H&P (Signed)
[facility]   Primary Care Physician:  Ronnell Freshwater, NP Ophthalmologist: Dr. Hortense Ramal  Pre-Procedure History & Physical: HPI:  Lisa Gutierrez is a 72 y.o. female here for cataract surgery.   Past Medical History:  Diagnosis Date   Anxiety    Depression    Depression    Phreesia 07/02/2020   Diabetes mellitus without complication (HCC)    GERD (gastroesophageal reflux disease)    Hypertension    IBS (irritable bowel syndrome)    Neuromuscular disorder [facility])     Past Surgical History:  Procedure Laterality Date   BREAST EXCISIONAL BIOPSY     CESAREAN SECTION N/A    Phreesia 07/02/2020   CHOLECYSTECTOMY     EXCISION / BIOPSY BREAST / NIPPLE / DUCT Right 1973   duct removed   SPINE SURGERY N/A    Phreesia 07/02/2020   TUBAL LIGATION N/A    Phreesia 07/02/2020    Prior to Admission medications   Medication Sig Start Date End Date Taking? Authorizing Provider  acetaminophen (TYLENOL) 500 MG tablet Take 2 tablets by mouth as needed.   Yes [provider]  ALPRAZolam Duanne Moron) 0.5 MG tablet Take 0.5 mg by mouth 2 (two) times daily as needed. 03/28/22  Yes [provider]  Bioflavonoid Products (VITAMIN C) CHEW Chew by mouth. With vitamin D and elderberry   Yes [provider]  busPIRone (BUSPAR) 5 MG tablet Take 1 tablet (5 mg total) by mouth 2 (two) times daily. 06/23/22 09/21/22 Yes Hisada, Elie Goody, MD  Calcium Carbonate (CALCIUM 500 PO) Take by mouth.   Yes [provider]  ciprofloxacin (CIPRO) 500 MG tablet Take 1 tablet (500 mg total) by mouth 2 (two) times daily. 06/26/22  Yes Immordino, Annie Main, FNP  Flaxseed, Linseed, (FLAXSEED OIL PO) Take 1,500 mg by mouth 2 (two) times daily.   Yes [provider]  fluticasone (FLONASE) 50 MCG/ACT nasal spray Place 2 sprays into both nostrils daily. 04/23/22  Yes [provider]  glucose blood (ACCU-CHEK SMARTVIEW) test strip 1 each by Other route in the morning and at  bedtime. Use as instructed 05/01/22  Yes Boscia, Heather E, NP  JARDIANCE 10 MG TABS tablet TAKE 1 TABLET BY MOUTH EVERY DAY BEFORE BREAKFAST 06/26/21  Yes Boscia, Greer Ee, NP  LINZESS 145 MCG CAPS capsule TAKE 1 CAPSULE BY MOUTH EVERY DAY 11/09/21  Yes Boscia, Heather E, NP  meclizine (ANTIVERT) 12.5 MG tablet Take 1 tablet (12.5 mg total) by mouth 3 (three) times daily as needed for dizziness. 06/26/22  Yes Immordino, Annie Main, FNP  metoprolol succinate (TOPROL-XL) 25 MG 24 hr tablet TAKE 2 TABLETS BY MOUTH EVERY DAY 02/12/22  Yes Boscia, Heather E, NP  Multiple Vitamins-Minerals (MULTIVITAMIN ADULT PO) Take 1 tablet by mouth daily.   Yes [provider]  pantoprazole (PROTONIX) 40 MG tablet TAKE 1 TABLET BY MOUTH TWICE A DAY 07/10/21  Yes Boscia, Heather E, NP  sitaGLIPtin-metformin (JANUMET) 50-500 MG tablet Take 1 tablet by mouth 2 (two) times daily with a meal. 08/11/21  Yes Boscia, Heather E, NP  telmisartan-hydrochlorothiazide (MICARDIS HCT) 80-25 MG tablet Take 1 tablet by mouth daily. 06/11/22  Yes Ronnell Freshwater, NP  venlafaxine XR (EFFEXOR-XR) 150 MG 24 hr capsule Take 1 capsule (150 mg total) by mouth daily with breakfast. 05/26/22 08/24/22 Yes Hisada, Elie Goody, MD  acetic acid-hydrocortisone (VOSOL-HC) OTIC solution Place 4 drops into the right ear 2 (two) times daily. Patient not taking: Reported on 06/29/2022 12/29/20  Ronnell Freshwater, NP  clobetasol ointment (TEMOVATE) 0.05 % Apply topically to affected area daily until improved. 06/06/22   [provider]  doxycycline (VIBRA-TABS) 100 MG tablet Take 1 tablet (100 mg total) by mouth 2 (two) times daily. Patient not taking: Reported on 06/29/2022 04/05/22   Ronnell Freshwater, NP  fluconazole (DIFLUCAN) 150 MG tablet Take 1 tablet po QOD for 5 doses. Repeat treatment as needed Patient not taking: Reported on 06/29/2022 05/01/22   Ronnell Freshwater, NP  FLUZONE HIGH-DOSE QUADRIVALENT 0.7 ML SUSY  03/02/21   [provider]  JANUMET 50-1000 MG tablet TAKE ONE TABLET BY MOUTH TWICE DAILY Patient not taking: Reported on 06/29/2022 03/29/22   Ronnell Freshwater, NP  nystatin (MYCOSTATIN/NYSTOP) powder APPLY TO AFFECTED AREA TWICE A DAY Patient not taking: Reported on 06/29/2022 04/24/22   Ronnell Freshwater, NP  nystatin ointment (MYCOSTATIN) APPLY TO AFFECTED AREA TWICE A DAY Patient not taking: Reported on 06/29/2022 06/04/22   Ronnell Freshwater, NP  venlafaxine XR (EFFEXOR-XR) 37.5 MG 24 hr capsule Take 1 capsule (37.5 mg total) by mouth daily with breakfast. Take a total of 187.5 mg daily. Take along with 150 mg cap Patient not taking: Reported on 06/29/2022 06/15/22 08/14/22  [name], MD    Allergies as of 06/13/2022 - Review Complete 05/01/2022  Allergen Reaction Noted   Aspirin Other (See Comments) 12/28/2014   Ibuprofen Other (See Comments) 12/28/2014   Levofloxacin Other (See Comments) 12/28/2014   Meloxicam Other (See Comments) 12/28/2014   Morphine Other (See Comments) 12/28/2014   Naprosyn  [naproxen] Other (See Comments) 12/28/2014   Nsaids Other (See Comments) 12/28/2014   Quinolones  12/28/2014   Robinul  [glycopyrrolate] Nausea And Vomiting 12/28/2014   Penicillin v potassium Rash 12/28/2014   Sulfa antibiotics Rash 12/28/2014    Family History  Problem Relation Age of Onset   Diabetes Mother    Hypertension Mother    Coronary artery disease Father    Glaucoma Father    Breast cancer Neg Hx     Social History   Socioeconomic History   Marital status: Married    Spouse name: Not on file   Number of children: Not on file   Years of education: Not on file   Highest education level: Not on file  Occupational History   Not on file  Tobacco Use   Smoking status: Former   Smokeless tobacco: Never  Substance and Sexual Activity   Alcohol use: No    Alcohol/week: 0.0 standard drinks of alcohol   Drug use: Never   Sexual activity: Not Currently    Partners: Male  Other Topics  Concern   Not on file  Social History Narrative   Not on file   Social Determinants of Health   Financial Resource Strain: Not on file  Food Insecurity: Not on file  Transportation Needs: Not on file  Physical Activity: Not on file  Stress: Not on file  Social Connections: Not on file  Intimate Partner Violence: Not on file    Review of Systems: See HPI, otherwise negative ROS  Physical Exam: Ht 6' (1.829 m)   Wt 90.7 kg   BMI 27.12 kg/m  General:   Alert, cooperative in NAD Head:  Normocephalic and atraumatic. Respiratory:  Normal work of breathing. Cardiovascular:  RRR  Impression/Plan: Lisa Gutierrez is here for cataract surgery.  Risks, benefits, limitations, and alternatives regarding cataract surgery have been reviewed with the patient.  Questions  have been answered.  All parties agreeable.   Birder Robson, MD  07/10/2022, 2:02 PM

## 2022-07-10 NOTE — H&P (Signed)
History and Physical    Patient: Lisa Gutierrez DOB: 07-25-1950 DOA: 07/10/2022 DOS: the patient was seen and examined on 07/10/2022 PCP: Ronnell Freshwater, NP  Patient coming from: Home  Chief Complaint:  Chief Complaint  Patient presents with   Irregular Heart Beat   HPI: Lisa Gutierrez is a 72 y.o. female with medical history significant of type 2 diabetes hypertension, who presents to the ED due to elevated heart rate.  [name] states she was in her usual state of health today when she went to her eye doctor's office to have cataract surgery.  When she arrived there, she was told that her heart rate is very high and was told to go to the ED.  She denies any palpitations, chest pain, shortness of breath.  She noted mild intermittent dizziness this morning, but she states that this happens and is not unusual for her.  She is uncertain why it happens but thought it may be due to missing her home medications.  She denies any lower extremity swelling or orthopnea.  ED course: On arrival to the ED, patient was normotensive at 115/95 with heart rate of 158.  She was saturating at 98% on room air.  She was afebrile at 98. Initial workup notable for WBC of 8.5, hemoglobin 15.1, potassium 3.6, glucose 175, creatinine 0.67 with GFR above 60, negative troponin at 6, normal TSH at 0.9.  Chest x-ray was obtained that demonstrated enlarged cardiac silhouette and mild central vascular prominence.  Due to atrial fibrillation with RVR, patient was started on metoprolol injection and oral metoprolol.  EDP consulted cardiology.  TRH contacted for admission.  Review of Systems: As mentioned in the history of present illness. All other systems reviewed and are negative.  Past Medical History:  Diagnosis Date   Anxiety    Depression    Depression    Phreesia 07/02/2020   Diabetes mellitus without complication (HCC)    GERD (gastroesophageal reflux disease)     Hypertension    IBS (irritable bowel syndrome)    Neuromuscular disorder [facility])    Past Surgical History:  Procedure Laterality Date   BREAST EXCISIONAL BIOPSY     CESAREAN SECTION N/A    Phreesia 07/02/2020   CHOLECYSTECTOMY     EXCISION / BIOPSY BREAST / NIPPLE / DUCT Right 1973   duct removed   SPINE SURGERY N/A    Phreesia 07/02/2020   TUBAL LIGATION N/A    Phreesia 07/02/2020   Social History:  reports that she has quit smoking. She has never used smokeless tobacco. She reports that she does not drink alcohol and does not use drugs.  Allergies  Allergen Reactions   Aspirin Other (See Comments)    GI upset   Ibuprofen Other (See Comments)    GERD   Levofloxacin Other (See Comments)    Weight gain   Meloxicam Other (See Comments)    GI upset   Morphine Other (See Comments)   Naprosyn  [Naproxen] Other (See Comments)    GI upset   Nsaids Other (See Comments)    GI upset   Propofol Other (See Comments)    Had horrible nightmares   Quinolones    Robinul  [Glycopyrrolate] Nausea And Vomiting   Penicillin V Potassium Rash   Sulfa Antibiotics Rash    Family History  Problem Relation Age of Onset   Diabetes Mother    Hypertension Mother    Coronary artery disease Father    Glaucoma  Father    Breast cancer Neg Hx     Prior to Admission medications   Medication Sig Start Date End Date Taking? Authorizing Provider  acetaminophen (TYLENOL) 500 MG tablet Take 2 tablets by mouth as needed.    [provider]  acetic acid-hydrocortisone (VOSOL-HC) OTIC solution Place 4 drops into the right ear 2 (two) times daily. Patient not taking: Reported on 06/29/2022 12/29/20   Ronnell Freshwater, NP  ALPRAZolam Duanne Moron) 0.5 MG tablet Take 0.5 mg by mouth 2 (two) times daily as needed. 03/28/22   [provider]  Bioflavonoid Products (VITAMIN C) CHEW Chew by mouth. With vitamin D and elderberry    [provider]  busPIRone (BUSPAR) 5 MG tablet Take 1 tablet  (5 mg total) by mouth 2 (two) times daily. 06/23/22 09/21/22  [name], MD  Calcium Carbonate (CALCIUM 500 PO) Take by mouth.    [provider]  ciprofloxacin (CIPRO) 500 MG tablet Take 1 tablet (500 mg total) by mouth 2 (two) times daily. 06/26/22   Immordino, Annie Main, FNP  clobetasol ointment (TEMOVATE) 0.05 % Apply topically to affected area daily until improved. 06/06/22   [provider]  doxycycline (VIBRA-TABS) 100 MG tablet Take 1 tablet (100 mg total) by mouth 2 (two) times daily. Patient not taking: Reported on 06/29/2022 04/05/22   Ronnell Freshwater, NP  Flaxseed, Linseed, (FLAXSEED OIL PO) Take 1,500 mg by mouth 2 (two) times daily.    [provider]  fluconazole (DIFLUCAN) 150 MG tablet Take 1 tablet po QOD for 5 doses. Repeat treatment as needed Patient not taking: Reported on 06/29/2022 05/01/22   Ronnell Freshwater, NP  fluticasone (FLONASE) 50 MCG/ACT nasal spray Place 2 sprays into both nostrils daily. 04/23/22   [provider]  FLUZONE HIGH-DOSE QUADRIVALENT 0.7 ML SUSY  03/02/21   [provider]  glucose blood (ACCU-CHEK SMARTVIEW) test strip 1 each by Other route in the morning and at bedtime. Use as instructed 05/01/22   Ronnell Freshwater, NP  JANUMET 50-1000 MG tablet TAKE ONE TABLET BY MOUTH TWICE DAILY Patient not taking: Reported on 06/29/2022 03/29/22   Ronnell Freshwater, NP  JARDIANCE 10 MG TABS tablet TAKE 1 TABLET BY MOUTH EVERY DAY BEFORE BREAKFAST 06/26/21   Ronnell Freshwater, NP  LINZESS 145 MCG CAPS capsule TAKE 1 CAPSULE BY MOUTH EVERY DAY 11/09/21   Ronnell Freshwater, NP  meclizine (ANTIVERT) 12.5 MG tablet Take 1 tablet (12.5 mg total) by mouth 3 (three) times daily as needed for dizziness. 06/26/22   Immordino, Annie Main, FNP  metoprolol succinate (TOPROL-XL) 25 MG 24 hr tablet TAKE 2 TABLETS BY MOUTH EVERY DAY 02/12/22   Ronnell Freshwater, NP  Multiple Vitamins-Minerals (MULTIVITAMIN ADULT PO) Take 1 tablet by mouth daily.     [provider]  nystatin (MYCOSTATIN/NYSTOP) powder APPLY TO AFFECTED AREA TWICE A DAY Patient not taking: Reported on 06/29/2022 04/24/22   Ronnell Freshwater, NP  nystatin ointment (MYCOSTATIN) APPLY TO AFFECTED AREA TWICE A DAY Patient not taking: Reported on 06/29/2022 06/04/22   Ronnell Freshwater, NP  pantoprazole (PROTONIX) 40 MG tablet TAKE 1 TABLET BY MOUTH TWICE A DAY 07/10/21   Ronnell Freshwater, NP  sitaGLIPtin-metformin (JANUMET) 50-500 MG tablet Take 1 tablet by mouth 2 (two) times daily with a meal. 08/11/21   Boscia, Greer Ee, NP  telmisartan-hydrochlorothiazide (MICARDIS HCT) 80-25 MG tablet Take 1 tablet by mouth daily. 06/11/22   Ronnell Freshwater, NP  venlafaxine XR (EFFEXOR-XR) 150 MG 24 hr capsule Take 1 capsule (150 mg total) by mouth daily with breakfast. 05/26/22 08/24/22  [name], MD  venlafaxine XR (EFFEXOR-XR) 37.5 MG 24 hr capsule Take 1 capsule (37.5 mg total) by mouth daily with breakfast. Take a total of 187.5 mg daily. Take along with 150 mg cap Patient not taking: Reported on 06/29/2022 06/15/22 08/14/22  [name], MD    Physical Exam: Vitals:   07/10/22 1100 07/10/22 1145 07/10/22 1157 07/10/22 1315  BP: (!) 115/95 110/88  100/87  Pulse: (!) 46 (!) 183  (!) 159  Resp: 18   (!) 21  Temp:      TempSrc:      SpO2: 98%   95%  Height:   5\' 6"  (1.676 m)    Physical Exam Vitals and nursing note reviewed.  Constitutional:      General: She is not in acute distress.    Appearance: She is obese. She is not toxic-appearing.  HENT:     Head: Normocephalic and atraumatic.     Mouth/Throat:     Mouth: Mucous membranes are moist.     Pharynx: Oropharynx is clear.  Eyes:     Conjunctiva/sclera: Conjunctivae normal.     Pupils: Pupils are equal, round, and reactive to light.  Cardiovascular:     Rate and Rhythm: Tachycardia present. Rhythm irregularly irregular.     Heart sounds: No murmur heard. Pulmonary:     Effort: Pulmonary effort is normal. No  respiratory distress.     Breath sounds: Normal breath sounds. No wheezing, rhonchi or rales.  Abdominal:     General: Bowel sounds are normal. There is no distension.     Palpations: Abdomen is soft.     Tenderness: There is no abdominal tenderness.  Musculoskeletal:     Right lower leg: No edema.     Left lower leg: No edema.  Skin:    General: Skin is warm and dry.  Neurological:     General: No focal deficit present.     Mental Status: She is alert and oriented to person, place, and time. Mental status is at baseline.  Psychiatric:        Mood and Affect: Mood normal.        Behavior: Behavior normal.    Data Reviewed: CBC with WBC of 8.5, hemoglobin of 15.1, platelets of 225 BMP with sodium of 137, potassium 3.6, chloride 96, bicarb 25, glucose 175, BUN 12, creatinine 0.67, calcium 9.9, anion gap of 16 and GFR above 60 Troponin negative at 6 TSH within normal limits at 0.908  EKG personally reviewed.  Irregular narrow complex tachycardia consistent with atrial fibrillation with RVR  DG Chest Port 1 View  Result Date: 07/10/2022 CLINICAL DATA:  Chest pain, tachycardia EXAM: PORTABLE CHEST 1 VIEW COMPARISON:  None Available. FINDINGS: Single frontal view of the chest demonstrate an enlarged cardiac silhouette. There is mild central vascular prominence without focal airspace disease, effusion, or pneumothorax. No acute bony abnormalities. IMPRESSION: 1. Enlarged cardiac silhouette. 2. Mild central vascular prominence without overt edema. Electronically Signed   By: Randa Ngo M.D.   On: 07/10/2022 10:44    There are no new results to review at this time.  Assessment and Plan:  * Atrial fibrillation with RVR [facility]) Patient presenting with atrial fibrillation with RVR, generally asymptomatic.  Patient notes intermittent dizziness that has been happening for a long time now but she was uncertain on etiology.  She received  metoprolol in the ED, both IV and p.o., unfortunately  patient's blood pressure has decreased.  Patient is asymptomatic from hypotension.  - Cardiology consulted; appreciate their recommendations - Echocardiogram ordered - Heparin drip per pharmacy protocol given possible need for cardioversion - Continue diltiazem infusion without bolus and titrate slowly - If unable to tolerate, will need cardioversion  Hypotension In the setting of of IV and p.o. metoprolol, in addition to A-fib with RVR.  - Hold home antihypertensives - S/p 500 cc bolus  Uncontrolled type 2 diabetes mellitus with hyperglycemia ([place]) - Hold home antiglycemic agents - SSI, moderate  Essential hypertension - Hold home antihypertensives in the setting of low blood pressure  MDD (major depressive disorder), recurrent, in partial remission ([place]) - Continue venlafaxine, as needed Xanax and BuSpar  Advance Care Planning:   Code Status: Full Code verified by patient  Consults: Cardiology  Family Communication: No family at bedside  Severity of Illness: The appropriate patient status for this patient is OBSERVATION. Observation status is judged to be reasonable and necessary in order to provide the required intensity of service to ensure the patient's safety. The patient's presenting symptoms, physical exam findings, and initial radiographic and laboratory data in the context of their medical condition is felt to place them at decreased risk for further clinical deterioration. Furthermore, it is anticipated that the patient will be medically stable for discharge from the hospital within 2 midnights of admission.   Author: Jose Persia, MD 07/10/2022 2:29 PM  For on call review [email or web address].

## 2022-07-11 ENCOUNTER — Encounter: Payer: Self-pay | Admitting: Internal Medicine

## 2022-07-11 DIAGNOSIS — I10 Essential (primary) hypertension: Secondary | ICD-10-CM | POA: Diagnosis not present

## 2022-07-11 DIAGNOSIS — K219 Gastro-esophageal reflux disease without esophagitis: Secondary | ICD-10-CM | POA: Diagnosis not present

## 2022-07-11 DIAGNOSIS — Z881 Allergy status to other antibiotic agents status: Secondary | ICD-10-CM | POA: Diagnosis not present

## 2022-07-11 DIAGNOSIS — I4891 Unspecified atrial fibrillation: Secondary | ICD-10-CM | POA: Diagnosis not present

## 2022-07-11 DIAGNOSIS — E1165 Type 2 diabetes mellitus with hyperglycemia: Secondary | ICD-10-CM | POA: Diagnosis not present

## 2022-07-11 DIAGNOSIS — F419 Anxiety disorder, unspecified: Secondary | ICD-10-CM | POA: Diagnosis present

## 2022-07-11 DIAGNOSIS — Z6832 Body mass index (BMI) 32.0-32.9, adult: Secondary | ICD-10-CM | POA: Diagnosis not present

## 2022-07-11 DIAGNOSIS — Z83511 Family history of glaucoma: Secondary | ICD-10-CM | POA: Diagnosis not present

## 2022-07-11 DIAGNOSIS — E1136 Type 2 diabetes mellitus with diabetic cataract: Secondary | ICD-10-CM | POA: Diagnosis not present

## 2022-07-11 DIAGNOSIS — R69 Illness, unspecified: Secondary | ICD-10-CM | POA: Diagnosis not present

## 2022-07-11 DIAGNOSIS — Z880 Allergy status to penicillin: Secondary | ICD-10-CM | POA: Diagnosis not present

## 2022-07-11 DIAGNOSIS — F3341 Major depressive disorder, recurrent, in partial remission: Secondary | ICD-10-CM | POA: Diagnosis present

## 2022-07-11 DIAGNOSIS — E669 Obesity, unspecified: Secondary | ICD-10-CM | POA: Diagnosis not present

## 2022-07-11 DIAGNOSIS — Z833 Family history of diabetes mellitus: Secondary | ICD-10-CM | POA: Diagnosis not present

## 2022-07-11 DIAGNOSIS — Z885 Allergy status to narcotic agent status: Secondary | ICD-10-CM | POA: Diagnosis not present

## 2022-07-11 DIAGNOSIS — Z7901 Long term (current) use of anticoagulants: Secondary | ICD-10-CM | POA: Diagnosis not present

## 2022-07-11 DIAGNOSIS — K5909 Other constipation: Secondary | ICD-10-CM | POA: Diagnosis not present

## 2022-07-11 DIAGNOSIS — Z7984 Long term (current) use of oral hypoglycemic drugs: Secondary | ICD-10-CM | POA: Diagnosis not present

## 2022-07-11 DIAGNOSIS — Z87891 Personal history of nicotine dependence: Secondary | ICD-10-CM | POA: Diagnosis not present

## 2022-07-11 DIAGNOSIS — I959 Hypotension, unspecified: Secondary | ICD-10-CM | POA: Diagnosis not present

## 2022-07-11 DIAGNOSIS — Z8249 Family history of ischemic heart disease and other diseases of the circulatory system: Secondary | ICD-10-CM | POA: Diagnosis not present

## 2022-07-11 DIAGNOSIS — Z79899 Other long term (current) drug therapy: Secondary | ICD-10-CM | POA: Diagnosis not present

## 2022-07-11 DIAGNOSIS — Z886 Allergy status to analgesic agent status: Secondary | ICD-10-CM | POA: Diagnosis not present

## 2022-07-11 DIAGNOSIS — Z882 Allergy status to sulfonamides status: Secondary | ICD-10-CM | POA: Diagnosis not present

## 2022-07-11 LAB — CBC
HCT: 42.9 % (ref 36.0–46.0)
Hemoglobin: 14.1 g/dL (ref 12.0–15.0)
MCH: 30.5 pg (ref 26.0–34.0)
MCHC: 32.9 g/dL (ref 30.0–36.0)
MCV: 92.7 fL (ref 80.0–100.0)
Platelets: 214 10*3/uL (ref 150–400)
RBC: 4.63 MIL/uL (ref 3.87–5.11)
RDW: 13 % (ref 11.5–15.5)
WBC: 9.3 10*3/uL (ref 4.0–10.5)
nRBC: 0 % (ref 0.0–0.2)

## 2022-07-11 LAB — GLUCOSE, CAPILLARY
Glucose-Capillary: 151 mg/dL — ABNORMAL HIGH (ref 70–99)
Glucose-Capillary: 182 mg/dL — ABNORMAL HIGH (ref 70–99)
Glucose-Capillary: 238 mg/dL — ABNORMAL HIGH (ref 70–99)

## 2022-07-11 LAB — BASIC METABOLIC PANEL
Anion gap: 15 (ref 5–15)
BUN: 12 mg/dL (ref 8–23)
CO2: 23 mmol/L (ref 22–32)
Calcium: 9.4 mg/dL (ref 8.9–10.3)
Chloride: 98 mmol/L (ref 98–111)
Creatinine, Ser: 0.65 mg/dL (ref 0.44–1.00)
GFR, Estimated: >60 mL/min (ref >60)
Glucose, Bld: 218 mg/dL — ABNORMAL HIGH (ref 70–99)
Potassium: 3.7 mmol/L (ref 3.5–5.1)
Sodium: 136 mmol/L (ref 135–145)

## 2022-07-11 LAB — ECHOCARDIOGRAM COMPLETE
Height: 66 in
S' Lateral: 3.7 cm

## 2022-07-11 LAB — HEMOGLOBIN A1C
Hgb A1c MFr Bld: 7.5 % — ABNORMAL HIGH (ref 4.8–5.6)
Mean Plasma Glucose: 169 mg/dL

## 2022-07-11 LAB — HEPARIN LEVEL (UNFRACTIONATED): Heparin Unfractionated: 0.28 IU/mL — ABNORMAL LOW (ref 0.30–0.70)

## 2022-07-11 LAB — MAGNESIUM: Magnesium: 1.8 mg/dL (ref 1.7–2.4)

## 2022-07-11 MED ORDER — APIXABAN 5 MG PO TABS
5.0000 mg | ORAL_TABLET | Freq: Two times a day (BID) | ORAL | Status: DC
  Administered 2022-07-11 – 2022-07-12 (×2): 5 mg via ORAL
  Filled 2022-07-11 (×2): qty 1

## 2022-07-11 MED ORDER — MAGNESIUM SULFATE IN D5W 1-5 GM/100ML-% IV SOLN
1.0000 g | Freq: Once | INTRAVENOUS | Status: AC
  Administered 2022-07-11: 1 g via INTRAVENOUS
  Filled 2022-07-11: qty 100

## 2022-07-11 MED ORDER — HEPARIN BOLUS VIA INFUSION
2350.0000 [IU] | Freq: Once | INTRAVENOUS | Status: AC
  Administered 2022-07-11: 2350 [IU] via INTRAVENOUS
  Filled 2022-07-11: qty 2350

## 2022-07-11 MED ORDER — METOPROLOL TARTRATE 25 MG PO TABS
25.0000 mg | ORAL_TABLET | Freq: Four times a day (QID) | ORAL | Status: DC
  Administered 2022-07-11 – 2022-07-12 (×4): 25 mg via ORAL
  Filled 2022-07-11 (×4): qty 1

## 2022-07-11 MED ORDER — POTASSIUM CHLORIDE CRYS ER 20 MEQ PO TBCR
60.0000 meq | EXTENDED_RELEASE_TABLET | Freq: Once | ORAL | Status: AC
  Administered 2022-07-11: 60 meq via ORAL
  Filled 2022-07-11: qty 3

## 2022-07-11 NOTE — Progress Notes (Signed)
PROGRESS NOTE    Lisa Gutierrez  [id number] DOB: 07-15-1950 DOA: 07/10/2022 PCP: Ronnell Freshwater, NP     Brief Narrative:   From admission h and p  Lisa Gutierrez is a 72 y.o. female with medical history significant of type 2 diabetes hypertension, who presents to the ED due to elevated heart rate.   Lisa Gutierrez states she was in her usual state of health today when she went to her eye doctor's office to have cataract surgery.  When she arrived there, she was told that her heart rate is very high and was told to go to the ED.  She denies any palpitations, chest pain, shortness of breath.  She noted mild intermittent dizziness this morning, but she states that this happens and is not unusual for her.  She is uncertain why it happens but thought it may be due to missing her home medications.  She denies any lower extremity swelling or orthopnea.     Assessment & Plan:   Principal Problem:   Atrial fibrillation with RVR (HCC) Active Problems:   Hypotension   Uncontrolled type 2 diabetes mellitus with hyperglycemia (HCC)   Essential hypertension   MDD (major depressive disorder), recurrent, in partial remission (HCC)   Obesity (BMI 30-39.9)   Atrial fibrillation with rapid ventricular response ([place])  # A-fib with rvr Asymptomatic. No signs heart failure. TSH wnl. Hypotensive with metop in the ED, started on cardizem gtt. Cardiology consulted, they have added digoxin. Chads2vasc is 4 - continue digoxin - plan to wean cardizem today - metoprolol re-started - iv heparin, transitioning to apixaban - f/u TTE  # Hypertension Here soft BPs in setting of above - home antihypertensives on hold  # T2DM Recent A1c in the 7s - home janumet and jardiance on hold - SSI  # GAD - home alprazolam, venlafaxine, buspar  # Chronic constipation - home colace, linzess  # Obesity Noted   DVT prophylaxis: IV heparin Code Status: full Family Communication: none @  bedside  Level of care: Telemetry Medical Status is: Inpatient Remains inpatient appropriate because: severity of illness    Consultants:  cardiology  Procedures: none  Antimicrobials:  none    Subjective: Feels well, no dyspnea, chest pain, or palpitations, or swelling  Objective: Vitals:   07/11/22 0630 07/11/22 0645 07/11/22 0700 07/11/22 0754  BP: 94/73 91/66 108/84 113/80  Pulse: 92 81 87 69  Resp: 20 (!) 24 (!) 23 (!) 22  Temp:    98.4 F (36.9 C)  TempSrc:    Oral  SpO2: 95% 93% 92% 98%  Height:        Intake/Output Summary (Last 24 hours) at 07/11/2022 0918 Last data filed at 07/11/2022 0750 Gross per 24 hour  Intake 381.64 ml  Output --  Net 381.64 ml   There were no vitals filed for this visit.  Examination:  General exam: Appears calm and comfortable  Respiratory system: Clear to auscultation. Respiratory effort normal. Cardiovascular system: S1 & S2 heard,irreg irreg, tachycardic Gastrointestinal system: Abdomen is nondistended, soft and nontender. No organomegaly or masses felt. Normal bowel sounds heard. Central nervous system: Alert and oriented. No focal neurological deficits. Extremities: Symmetric 5 x 5 power. Skin: No rashes, lesions or ulcers Psychiatry: Judgement and insight appear normal. Mood & affect appropriate.     Data Reviewed: I have personally reviewed following labs and imaging studies  CBC: Recent Labs  Lab 07/10/22 1017 07/11/22 0442  WBC 8.5 9.3  HGB 15.1*  14.1  HCT 46.8* 42.9  MCV 91.9 92.7  PLT 225 [id number]   Basic Metabolic Panel: Recent Labs  Lab 07/10/22 1017 07/11/22 0442  NA 137 136  K 3.6 3.7  CL 96* 98  CO2 25 23  GLUCOSE 175* 218*  BUN 12 12  CREATININE 0.67 0.65  CALCIUM 9.9 9.4  MG 2.1 1.8   GFR: Estimated Creatinine Clearance: 73.2 mL/min (by C-G formula based on SCr of 0.65 mg/dL). Liver Function Tests: No results for input(s): "AST", "ALT", "ALKPHOS", "BILITOT", "PROT", "ALBUMIN" in the  last 168 hours. No results for input(s): "LIPASE", "AMYLASE" in the last 168 hours. No results for input(s): "AMMONIA" in the last 168 hours. Coagulation Profile: Recent Labs  Lab 07/10/22 1017  INR 1.1   Cardiac Enzymes: No results for input(s): "CKTOTAL", "CKMB", "CKMBINDEX", "TROPONINI" in the last 168 hours. BNP (last 3 results) No results for input(s): "PROBNP" in the last 8760 hours. HbA1C: Recent Labs    07/10/22 1017  HGBA1C 7.5*   CBG: Recent Labs  Lab 07/10/22 1731 07/10/22 2311  GLUCAP 124* 148*   Lipid Profile: No results for input(s): "CHOL", "HDL", "LDLCALC", "TRIG", "CHOLHDL", "LDLDIRECT" in the last 72 hours. Thyroid Function Tests: Recent Labs    07/10/22 1017  TSH 0.908   Anemia Panel: No results for input(s): "VITAMINB12", "FOLATE", "FERRITIN", "TIBC", "IRON", "RETICCTPCT" in the last 72 hours. Urine analysis:    Component Value Date/Time   COLORURINE AMBER (A) 12/07/2016 1758   APPEARANCEUR Clear 01/19/2022 1311   LABSPEC 1.020 12/07/2016 1758   LABSPEC 1.017 07/16/2012 1129   PHURINE 6.0 12/07/2016 1758   GLUCOSEU 3+ (A) 01/19/2022 1311   GLUCOSEU NEGATIVE 07/16/2012 1129   HGBUR NEGATIVE 12/07/2016 1758   BILIRUBINUR negative 06/26/2022 1953   BILIRUBINUR Negative 01/19/2022 1311   BILIRUBINUR NEGATIVE 07/16/2012 1129   KETONESUR trace (5) (A) 06/26/2022 1953   KETONESUR NEGATIVE 12/07/2016 1758   PROTEINUR negative 06/26/2022 1953   PROTEINUR Negative 01/19/2022 1311   PROTEINUR NEGATIVE 12/07/2016 1758   UROBILINOGEN 0.2 06/26/2022 1953   NITRITE Negative 06/26/2022 1953   NITRITE Negative 01/19/2022 1311   NITRITE NEGATIVE 12/07/2016 1758   LEUKOCYTESUR Negative 06/26/2022 1953   LEUKOCYTESUR 1+ (A) 01/19/2022 1311   LEUKOCYTESUR 3+ 07/16/2012 1129   Sepsis Labs: @LABRCNTIP (procalcitonin:4,lacticidven:4)  )No results found for this or any previous visit (from the past 240 hour(s)).       Radiology Studies: DG Chest Port  1 View  Result Date: 07/10/2022 CLINICAL DATA:  Chest pain, tachycardia EXAM: PORTABLE CHEST 1 VIEW COMPARISON:  None Available. FINDINGS: Single frontal view of the chest demonstrate an enlarged cardiac silhouette. There is mild central vascular prominence without focal airspace disease, effusion, or pneumothorax. No acute bony abnormalities. IMPRESSION: 1. Enlarged cardiac silhouette. 2. Mild central vascular prominence without overt edema. Electronically Signed   By: Randa Ngo M.D.   On: 07/10/2022 10:44        Scheduled Meds:  busPIRone  5 mg Oral BID   digoxin  0.25 mg Oral Daily   docusate sodium  100 mg Oral QHS   insulin aspart  0-15 Units Subcutaneous TID WC   linaclotide  145 mcg Oral QAC breakfast   metoprolol tartrate  25 mg Oral Q6H   pantoprazole  40 mg Oral BID   potassium chloride  60 mEq Oral Once   venlafaxine XR  150 mg Oral Q breakfast   venlafaxine XR  37.5 mg Oral Q breakfast   Continuous Infusions:  diltiazem (CARDIZEM) infusion 7.5 mg/hr (07/11/22 0750)   magnesium sulfate bolus IVPB       LOS: 0 days     Desma Maxim, MD [facility]   If 7PM-7AM, please contact night-coverage [email or web address] Password [facility] 07/11/2022, 9:18 AM

## 2022-07-11 NOTE — Progress Notes (Signed)
Rounding Note    Patient Name: Lisa Gutierrez Date of Encounter: 07/11/2022  [place] Cardiologist: Kathlyn Sacramento, MD new  Subjective   She did not sleep well overnight.  She continues to be in atrial fibrillation with ventricular rate is more controlled on small dose diltiazem drip.  Inpatient Medications    Scheduled Meds:  busPIRone  5 mg Oral BID   digoxin  0.25 mg Oral Daily   docusate sodium  100 mg Oral QHS   insulin aspart  0-15 Units Subcutaneous TID WC   linaclotide  145 mcg Oral QAC breakfast   metoprolol tartrate  25 mg Oral Q6H   pantoprazole  40 mg Oral BID   potassium chloride  60 mEq Oral Once   venlafaxine XR  150 mg Oral Q breakfast   venlafaxine XR  37.5 mg Oral Q breakfast   Continuous Infusions:  diltiazem (CARDIZEM) infusion 7.5 mg/hr (07/11/22 0750)   magnesium sulfate bolus IVPB     PRN Meds: acetaminophen, ALPRAZolam, ondansetron (ZOFRAN) IV   Vital Signs    Vitals:   07/11/22 0630 07/11/22 0645 07/11/22 0700 07/11/22 0754  BP: 94/73 91/66 108/84 113/80  Pulse: 92 81 87 69  Resp: 20 (!) 24 (!) 23 (!) 22  Temp:    98.4 F (36.9 C)  TempSrc:    Oral  SpO2: 95% 93% 92% 98%  Height:        Intake/Output Summary (Last 24 hours) at 07/11/2022 0905 Last data filed at 07/11/2022 0750 Gross per 24 hour  Intake 381.64 ml  Output --  Net 381.64 ml      06/29/2022    1:29 PM 01/01/2022    1:04 PM 08/24/2021   10:38 AM  Last 3 Weights  Weight (lbs) 200 lb 245 lb 245 lb 1.9 oz  Weight (kg) 90.719 kg 111.131 kg 111.186 kg      Telemetry    Atrial fibrillation with a heart rate from 90 bpm to 140 bpm- Personally Reviewed  ECG     - Personally Reviewed  Physical Exam   GEN: No acute distress.   Neck: No JVD Cardiac: Irregularly irregular and tachycardic, no murmurs, rubs, or gallops.  Respiratory: Clear to auscultation bilaterally. GI: Soft, nontender, non-distended  MS: No edema; No deformity. Neuro:   Nonfocal  Psych: Normal affect   Labs    High Sensitivity Troponin:   Recent Labs  Lab 07/10/22 1017 07/10/22 1756  TROPONINIHS 6 5     Chemistry Recent Labs  Lab 07/10/22 1017 07/11/22 0442  NA 137 136  K 3.6 3.7  CL 96* 98  CO2 25 23  GLUCOSE 175* 218*  BUN 12 12  CREATININE 0.67 0.65  CALCIUM 9.9 9.4  MG 2.1 1.8  GFRNONAA >60 >60  ANIONGAP 16* 15    Lipids No results for input(s): "CHOL", "TRIG", "HDL", "LABVLDL", "LDLCALC", "CHOLHDL" in the last 168 hours.  Hematology Recent Labs  Lab 07/10/22 1017 07/11/22 0442  WBC 8.5 9.3  RBC 5.09 4.63  HGB 15.1* 14.1  HCT 46.8* 42.9  MCV 91.9 92.7  MCH 29.7 30.5  MCHC 32.3 32.9  RDW 12.9 13.0  PLT 225 214   Thyroid  Recent Labs  Lab 07/10/22 1017  TSH 0.908    BNPNo results for input(s): "BNP", "PROBNP" in the last 168 hours.  DDimer No results for input(s): "DDIMER" in the last 168 hours.   Radiology    DG Chest [facility] 1 View  Result  Date: 07/10/2022 CLINICAL DATA:  Chest pain, tachycardia EXAM: PORTABLE CHEST 1 VIEW COMPARISON:  None Available. FINDINGS: Single frontal view of the chest demonstrate an enlarged cardiac silhouette. There is mild central vascular prominence without focal airspace disease, effusion, or pneumothorax. No acute bony abnormalities. IMPRESSION: 1. Enlarged cardiac silhouette. 2. Mild central vascular prominence without overt edema. Electronically Signed   By: Randa Ngo M.D.   On: 07/10/2022 10:44    Cardiac Studies   Echocardiogram preliminary images were personally reviewed by me and showed low normal LV systolic function with mildly to moderately dilated left atrium and no significant valvular abnormalities.  The study was suboptimal due to tachycardia.  Patient Profile     72 y.o. female  with a hx of essential hypertension, chronic allergic rhinitis, gastroesophageal reflux disease, irritable bowel syndrome with constipation, chronic superficial gastritis without bleeding,  fatty liver disease that is nonalcoholic, type 2 diabetes, generalized anxiety disorder, insomnia, vertigo, history of statin intolerance, morbid obesity, who is being seen 07/10/2022 for the evaluation of atrial fibrillation RVR at the request of Dr. Jacqualine Code.   Assessment & Plan    1.  Newly diagnosed A-fib with RVR: Unknown duration given the lack of symptoms.  CHA2DS2-VASc score of 4.  Will discontinue heparin drip and start Eliquis 5 mg twice daily. Continue digoxin for rate control and will check digoxin level tomorrow morning. I added metoprolol tartrate 25 mg every 6 hours for better rate control and with the hope of being able to completely discontinue diltiazem drip. I am hoping we will be able to control her rate by tomorrow.  Otherwise, she might require TEE guided cardioversion. If rate is controlled by tomorrow, she can be discharged with plans for outpatient cardioversion in 3 to 4 weeks.  2.  Essential hypertension: Recommend holding antihypertensive medications to allow up titration of rate control medications.      For questions or updates, please contact [place] Please consult [email or web address] for contact info under        Signed, Kathlyn Sacramento, MD  07/11/2022, 9:05 AM

## 2022-07-11 NOTE — Consult Note (Signed)
[place] for heparin drip Indication: atrial fibrillation  Allergies  Allergen Reactions   Aspirin Other (See Comments)    GI upset   Ibuprofen Other (See Comments)    GERD   Levofloxacin Other (See Comments)    Weight gain   Meloxicam Other (See Comments)    GI upset   Morphine Other (See Comments)   Naprosyn  [Naproxen] Other (See Comments)    GI upset   Nsaids Other (See Comments)    GI upset   Propofol Other (See Comments)    Had horrible nightmares   Quinolones    Robinul  [Glycopyrrolate] Nausea And Vomiting   Penicillin V Potassium Rash   Sulfa Antibiotics Rash    Patient Measurements: Height: 5\' 6"  (167.6 cm) IBW/kg (Calculated) : 59.3 Heparin Dosing Weight: 78.8 kg  Vital Signs: Temp: 98.3 F (36.8 C) (03/26 2248) Temp Source: Oral (03/26 2248) BP: 124/87 (03/27 0015) Pulse Rate: 105 (03/27 0015)  Labs: Recent Labs    07/10/22 1017 07/10/22 1756 07/10/22 2311  HGB 15.1*  --   --   HCT 46.8*  --   --   PLT 225  --   --   APTT 27  --   --   LABPROT 13.7  --   --   INR 1.1  --   --   HEPARINUNFRC  --   --  <0.10*  CREATININE 0.67  --   --   TROPONINIHS 6 5  --      Estimated Creatinine Clearance: 73.2 mL/min (by C-G formula based on SCr of 0.67 mg/dL).   Medical History: Past Medical History:  Diagnosis Date   Anxiety    Depression    Depression    Phreesia 07/02/2020   Diabetes mellitus without complication (HCC)    GERD (gastroesophageal reflux disease)    Hypertension    IBS (irritable bowel syndrome)    Neuromuscular disorder (HCC)     Medications:  No home anticoagulation per pharmacist review  Assessment: 72 yo female sent to ED from [facility] due to rapid heart rate.  In the ED patient found to be in Afib.  Pharmacy consulted to start heparin drip.  Baseline labs: hgb 15.1, hct 46.8, plt 225, INR pending, aPTT pending  Goal of Therapy:  Heparin level 0.3-0.7  units/ml Monitor platelets by anticoagulation protocol: Yes   Plan:  3/26:  HL @ 2311 = < 0.1 , SUBtherapeutic  - no interruptions in heparin gtt per RN - Will order heparin 2350 units IV X 1 bolus and increase drip rate to 1450 units/hr.  - Will recheck HL 8 hrs after rate change  Ngan Qualls D, PharmD 07/11/2022,12:23 AM

## 2022-07-11 NOTE — TOC Initial Note (Signed)
Transition of Care [facility]) - Initial/Assessment Note    Patient Details  Name: Lisa Gutierrez MRN: [id number] Date of Birth: 17-Jul-1950  Transition of Care [facility]) CM/SW Contact:    Laurena Slimmer, RN Phone Number: 07/11/2022, 9:57 AM  Clinical Narrative:                         Patient Goals and CMS Choice            Expected Discharge Plan and Services                                              Prior Living Arrangements/Services                       Activities of Daily Living Home Assistive Devices/Equipment: Eyeglasses ADL Screening (condition at time of admission) Patient's cognitive ability adequate to safely complete daily activities?: Yes Is the patient deaf or have difficulty hearing?: No Does the patient have difficulty seeing, even when wearing glasses/contacts?: No Does the patient have difficulty concentrating, remembering, or making decisions?: No Patient able to express need for assistance with ADLs?: Yes Does the patient have difficulty dressing or bathing?: No Independently performs ADLs?: Yes (appropriate for developmental age) Does the patient have difficulty walking or climbing stairs?: No Weakness of Legs: None Weakness of Arms/Hands: None  Permission Sought/Granted                  Emotional Assessment              Admission diagnosis:  Atrial fibrillation with RVR ([place]) [I48.91] Atrial fibrillation with rapid ventricular response ([place]) [I48.91] Patient Active Problem List   Diagnosis Date Noted   Atrial fibrillation with rapid ventricular response ([place]) 07/11/2022   Atrial fibrillation with RVR ([place]) 07/10/2022   Hypotension 07/10/2022   Prurigo nodularis 01/01/2022   Acute pain of right shoulder 01/01/2022   Yeast infection 03/05/2021   Acute eczematoid otitis externa of right ear 01/03/2021   Chronic pain of left knee 01/03/2021   Body mass index (BMI) of 40.1-44.9 in adult ([place]) 01/03/2021    Cyst of right ovary 12/09/2020   Obesity (BMI 30-39.9) 10/04/2020   BMI 37.0-37.9, adult 10/03/2020   Encounter to establish care 07/05/2020   Statin intolerance 03/06/2020   Other fatigue 11/04/2019   History of COVID-19 11/04/2019   MDD (major depressive disorder), recurrent, in partial remission ([place]) 06/10/2019   Vertigo 05/22/2019   Exposure to COVID-19 virus 05/04/2019   Acute recurrent pansinusitis 03/18/2019   Chronic allergic rhinitis 03/18/2019   Irritable bowel syndrome with constipation 02/25/2019   MDD (major depressive disorder), recurrent episode, moderate ([place]) 01/30/2019   Marital relationship problem 01/30/2019   Acute non-recurrent pansinusitis 08/04/2018   Vaginal candidiasis 08/04/2018   Type 2 diabetes mellitus with hyperglycemia ([place]) 07/02/2018   Urinary tract infection without hematuria 07/02/2018   Acute upper respiratory infection 03/25/2018   Flu-like symptoms 03/25/2018   Cough 03/25/2018   Encounter for general adult medical examination with abnormal findings 02/06/2018   Type 2 diabetes mellitus with hyperglycemia, without long-term current use of insulin ([place]) 02/06/2018   Primary insomnia 02/06/2018   Uncontrolled type 2 diabetes mellitus with hyperglycemia ([place]) 12/06/2017   Essential hypertension 12/06/2017   Cutaneous candidiasis 12/06/2017   GAD (generalized anxiety disorder)  12/06/2017   Gastroesophageal reflux disease without esophagitis 12/06/2017   Dysuria 12/06/2017   Fatty liver disease, nonalcoholic 0000000   Chronic superficial gastritis without bleeding 11/23/2016   External hemorrhoids 11/06/2016   Other constipation 11/06/2016   RUQ pain 11/06/2016   PCP:  Ronnell Freshwater, NP Pharmacy:   [id number] - [place], [place] - [place] [address] Phone: [phone] Fax: [phone]  [place], [address], [address] [place], [place] [address] Phone: [phone] Fax: [place], [place] [address] Phone: [phone] Fax: [phone]     Social Determinants of Health (SDOH) Social History: Fate: No Food Insecurity (07/11/2022)  Housing: Low Risk  (07/11/2022)  Transportation Needs: No Transportation Needs (07/11/2022)  Utilities: Not At Risk (07/11/2022)  Alcohol Screen: Low Risk  (03/25/2018)  Depression (PHQ2-9): Low Risk  (03/20/2022)  Recent Concern: Depression (PHQ2-9) - High Risk (01/01/2022)  Tobacco Use: Medium Risk (07/11/2022)   SDOH Interventions:     Readmission Risk Interventions     No data to display

## 2022-07-11 NOTE — Consult Note (Signed)
[place] for heparin drip Indication: atrial fibrillation  Allergies  Allergen Reactions   Aspirin Other (See Comments)    GI upset   Ibuprofen Other (See Comments)    GERD   Levofloxacin Other (See Comments)    Weight gain   Meloxicam Other (See Comments)    GI upset   Morphine Other (See Comments)   Naprosyn  [Naproxen] Other (See Comments)    GI upset   Nsaids Other (See Comments)    GI upset   Propofol Other (See Comments)    Had horrible nightmares   Quinolones    Robinul  [Glycopyrrolate] Nausea And Vomiting   Penicillin V Potassium Rash   Sulfa Antibiotics Rash    Patient Measurements: Height: 5\' 6"  (167.6 cm) IBW/kg (Calculated) : 59.3 Heparin Dosing Weight: 78.8 kg  Vital Signs: Temp: 98.4 F (36.9 C) (03/27 0754) Temp Source: Oral (03/27 0754) BP: 113/80 (03/27 0754) Pulse Rate: 69 (03/27 0754)  Labs: Recent Labs    07/10/22 1017 07/10/22 1756 07/10/22 2311 07/11/22 0442 07/11/22 0757  HGB 15.1*  --   --  14.1  --   HCT 46.8*  --   --  42.9  --   PLT 225  --   --  214  --   APTT 27  --   --   --   --   LABPROT 13.7  --   --   --   --   INR 1.1  --   --   --   --   HEPARINUNFRC  --   --  <0.10*  --  0.28*  CREATININE 0.67  --   --  0.65  --   TROPONINIHS 6 5  --   --   --      Estimated Creatinine Clearance: 73.2 mL/min (by C-G formula based on SCr of 0.65 mg/dL).   Medical History: Past Medical History:  Diagnosis Date   Anxiety    Depression    Depression    Phreesia 07/02/2020   Diabetes mellitus without complication (HCC)    GERD (gastroesophageal reflux disease)    Hypertension    IBS (irritable bowel syndrome)    Neuromuscular disorder (HCC)     Medications:  No home anticoagulation per pharmacist review  Assessment: 72 yo female sent to ED from [facility] surgical center due to rapid heart rate.  In the ED patient found to be in Afib. Patient was prescription ciprofloxacin on 3/12 for a  7 day course (diverticulitis flare) and her dose of venlafaxine was increased to 150 to 187.5 mg, but the med rec states the patient hasn't started this regimen yet. Cipro induced afib is probably rare but might have been the cause. CHADSVASc 4. Pharmacy consulted to start apixaban.    Goal of Therapy:  Heparin level 0.3-0.7 units/ml Monitor platelets by anticoagulation protocol: Yes   Plan:  Apixaban 5 mg BID. Pt does not need criteria for dose reduction.   Oswald Hillock, PharmD 07/11/2022,9:21 AM

## 2022-07-12 DIAGNOSIS — E1165 Type 2 diabetes mellitus with hyperglycemia: Secondary | ICD-10-CM | POA: Diagnosis not present

## 2022-07-12 DIAGNOSIS — I4891 Unspecified atrial fibrillation: Secondary | ICD-10-CM | POA: Diagnosis not present

## 2022-07-12 DIAGNOSIS — I10 Essential (primary) hypertension: Secondary | ICD-10-CM | POA: Diagnosis not present

## 2022-07-12 LAB — GLUCOSE, CAPILLARY
Glucose-Capillary: 137 mg/dL — ABNORMAL HIGH (ref 70–99)
Glucose-Capillary: 169 mg/dL — ABNORMAL HIGH (ref 70–99)
Glucose-Capillary: 194 mg/dL — ABNORMAL HIGH (ref 70–99)

## 2022-07-12 LAB — DIGOXIN LEVEL: Digoxin Level: 0.6 ng/mL — ABNORMAL LOW (ref 0.8–2.0)

## 2022-07-12 MED ORDER — METOPROLOL TARTRATE 50 MG PO TABS: 50.0000 mg | ORAL_TABLET | Freq: Two times a day (BID) | ORAL | Status: DC

## 2022-07-12 MED ORDER — APIXABAN 5 MG PO TABS: 5.0000 mg | ORAL_TABLET | Freq: Two times a day (BID) | ORAL | 1 refills | Status: DC

## 2022-07-12 MED ORDER — METOPROLOL TARTRATE 50 MG PO TABS
75.0000 mg | ORAL_TABLET | Freq: Two times a day (BID) | ORAL | Status: DC
  Administered 2022-07-12: 75 mg via ORAL
  Filled 2022-07-12: qty 1

## 2022-07-12 MED ORDER — DIGOXIN 250 MCG PO TABS: 0.2500 mg | ORAL_TABLET | Freq: Every day | ORAL | 1 refills | Status: DC

## 2022-07-12 MED ORDER — METOPROLOL TARTRATE 75 MG PO TABS: 75.0000 mg | ORAL_TABLET | Freq: Two times a day (BID) | ORAL | 1 refills | Status: DC

## 2022-07-12 NOTE — Progress Notes (Signed)
Rounding Note    Patient Name: Lisa Gutierrez Date of Encounter: 07/12/2022  [place] Cardiologist: Kathlyn Sacramento, MD new  Subjective   No angina or dyspnea. Remains in Afib with ventricular rates in the 80s to 90s bpm. Diltiazem gtt stopped this morning.   Inpatient Medications    Scheduled Meds:  apixaban  5 mg Oral BID   busPIRone  5 mg Oral BID   digoxin  0.25 mg Oral Daily   docusate sodium  100 mg Oral QHS   insulin aspart  0-15 Units Subcutaneous TID WC   linaclotide  145 mcg Oral QAC breakfast   metoprolol tartrate  75 mg Oral BID   pantoprazole  40 mg Oral BID   venlafaxine XR  150 mg Oral Q breakfast   venlafaxine XR  37.5 mg Oral Q breakfast   Continuous Infusions:   PRN Meds: acetaminophen, ALPRAZolam, ondansetron (ZOFRAN) IV   Vital Signs    Vitals:   07/12/22 0330 07/12/22 0451 07/12/22 0500 07/12/22 0755  BP: (!) 112/94   125/86  Pulse: 78 72  92  Resp: (!) 24 17  20   Temp: 98 F (36.7 C)   98.6 F (37 C)  TempSrc: Oral   Oral  SpO2: 94% 93%  98%  Weight:   91 kg   Height:        Intake/Output Summary (Last 24 hours) at 07/12/2022 0905 Last data filed at 07/11/2022 1925 Gross per 24 hour  Intake 531.62 ml  Output --  Net 531.62 ml       07/12/2022    5:00 AM 06/29/2022    1:29 PM 01/01/2022    1:04 PM  Last 3 Weights  Weight (lbs) 200 lb 9.9 oz 200 lb 245 lb  Weight (kg) 91 kg 90.719 kg 111.131 kg      Telemetry    Atrial fibrillation with ventricular rates in the 80s to 90s bpm - Personally Reviewed  ECG    No new tracings - Personally Reviewed  Physical Exam   GEN: No acute distress.   Neck: No JVD Cardiac: Irregularly irregular, no murmurs, rubs, or gallops.  Respiratory: Clear to auscultation bilaterally. GI: Soft, nontender, non-distended  MS: No edema; No deformity. Neuro:  Nonfocal  Psych: Normal affect   Labs    High Sensitivity Troponin:   Recent Labs  Lab 07/10/22 1017  07/10/22 1756  TROPONINIHS 6 5      Chemistry Recent Labs  Lab 07/10/22 1017 07/11/22 0442  NA 137 136  K 3.6 3.7  CL 96* 98  CO2 25 23  GLUCOSE 175* 218*  BUN 12 12  CREATININE 0.67 0.65  CALCIUM 9.9 9.4  MG 2.1 1.8  GFRNONAA >60 >60  ANIONGAP 16* 15     Lipids No results for input(s): "CHOL", "TRIG", "HDL", "LABVLDL", "LDLCALC", "CHOLHDL" in the last 168 hours.  Hematology Recent Labs  Lab 07/10/22 1017 07/11/22 0442  WBC 8.5 9.3  RBC 5.09 4.63  HGB 15.1* 14.1  HCT 46.8* 42.9  MCV 91.9 92.7  MCH 29.7 30.5  MCHC 32.3 32.9  RDW 12.9 13.0  PLT 225 214    Thyroid  Recent Labs  Lab 07/10/22 1017  TSH 0.908     BNPNo results for input(s): "BNP", "PROBNP" in the last 168 hours.  DDimer No results for input(s): "DDIMER" in the last 168 hours.   Radiology    DG Chest Port 1 View  Result Date: 07/10/2022 IMPRESSION: 1. Enlarged  cardiac silhouette. 2. Mild central vascular prominence without overt edema. Electronically Signed   By: Randa Ngo M.D.   On: 07/10/2022 10:44    Cardiac Studies   2D echo 07/10/2022: 1. Left ventricular ejection fraction, by estimation, is 50 to 55%. The  left ventricle has low normal function. The left ventricle has no regional  wall motion abnormalities. There is mild left ventricular hypertrophy.  Left ventricular diastolic  parameters are indeterminate.   2. Right ventricular systolic function is normal. The right ventricular  size is normal. Tricuspid regurgitation signal is inadequate for assessing  PA pressure.   3. Left atrial size was moderately dilated.   4. The mitral valve is normal in structure. No evidence of mitral valve  regurgitation. No evidence of mitral stenosis.   5. The aortic valve is normal in structure. Aortic valve regurgitation is  not visualized. No aortic stenosis is present.   Patient Profile     72 y.o. female  with a hx of essential hypertension, chronic allergic rhinitis, gastroesophageal  reflux disease, irritable bowel syndrome with constipation, chronic superficial gastritis without bleeding, fatty liver disease that is nonalcoholic, type 2 diabetes, generalized anxiety disorder, insomnia, vertigo, history of statin intolerance, morbid obesity, who is being seen for the evaluation of atrial fibrillation RVR at the request of Dr. Jacqualine Code.   Assessment & Plan    1.  Newly diagnosed A-fib with RVR: Unknown duration given the lack of symptoms.  Asymptomatic.  Ventricular rates well controlled in the 80s to 90s bpm.  Now off diltiazem gtt.  Titrate Lopressor to 75 mg bid.  Continue digoxin.  CHA2DS2-VASc score of 4.  Continue Eliquis 5 mg bid, does not meet reduced dosing criteria.  Ambulate later this morning to assess for rate control and symptoms.  If she remains asymptomatic and ventricular rates are well controlled, would pursue DCCV in the outpatient setting after she has been adequately anticoagulated for a minimum of 4 weeks without interruption.    2.  Essential hypertension: Blood pressure well controlled.  Lopressor as above.       For questions or updates, please contact [place] Please consult [email or web address] for contact info under        Signed, Christell Faith, PA-C  07/12/2022, 9:05 AM

## 2022-07-12 NOTE — Plan of Care (Signed)
OK - ambulated in hall - w/ family walking behind her (to support). Anxiety is big deal with this patient, so please note: IF family is not here, we may get totally different results. Anyhow, HR climbed to mid 140s, but adjusted to the 90s shortly after returning to the room/bed. Overall - very good trial!   Hospitalist and Cardio informed of results.

## 2022-07-12 NOTE — Discharge Summary (Signed)
Lisa Gutierrez [id number] DOB: February 05, 1951 DOA: 07/10/2022  PCP: Ronnell Freshwater, NP  Admit date: 07/10/2022 Discharge date: 07/12/2022  Time spent: 35 minutes  Recommendations for Outpatient Follow-up:  Pcp and cardiology f/u     Discharge Diagnoses:  Principal Problem:   Atrial fibrillation with RVR ([place]) Active Problems:   Hypotension   Uncontrolled type 2 diabetes mellitus with hyperglycemia ([place])   Essential hypertension   MDD (major depressive disorder), recurrent, in partial remission (HCC)   Obesity (BMI 30-39.9)   Atrial fibrillation, rapid ([place])   Discharge Condition: stable  Diet recommendation: heart healthy  Filed Weights   07/12/22 0500  Weight: 91 kg    History of present illness:  From admission h and p Lisa Gutierrez is a 72 y.o. female with medical history significant of type 2 diabetes hypertension, who presents to the ED due to elevated heart rate.   [name] states she was in her usual state of health today when she went to her eye doctor's office to have cataract surgery.  When she arrived there, she was told that her heart rate is very high and was told to go to the ED.  She denies any palpitations, chest pain, shortness of breath.  She noted mild intermittent dizziness this morning, but she states that this happens and is not unusual for her.  She is uncertain why it happens but thought it may be due to missing her home medications.  She denies any lower extremity swelling or orthopnea.  Hospital Course:  Presented when incidental a-fib with rvr noted at a doctor's visit. Tsh wnl. Hypotensive with initiatiation of metoprolol in the ED so started on cardizem gtt and admitted. Cardiology consulted. They have added digoxin and transitioned back to metoprolol which she is tolerating. Ambulated without difficulty. TTE unremarkable. Patient says she had a relatively recent normal sleep study. Will discharge with new digoxin, increased  dose of metoprolol, and new apixaban. Close cardiology and PCP f/u advised.   Procedures: none   Consultations: cardiology  Discharge Exam: Vitals:   07/12/22 1130 07/12/22 1143  BP:  (!) 129/90  Pulse: (!) 146 90  Resp:  18  Temp:  98.8 F (37.1 C)  SpO2:  94%    General: anxious Cardiovascular: irreg irreg Respiratory: CTAB  Discharge Instructions   Discharge Instructions     Diet - low sodium heart healthy   Complete by: As directed    Increase activity slowly   Complete by: As directed       Allergies as of 07/12/2022       Reactions   Aspirin Other (See Comments)   GI upset   Ibuprofen Other (See Comments)   GERD   Levofloxacin Other (See Comments)   Weight gain   Meloxicam Other (See Comments)   GI upset   Morphine Other (See Comments)   Naprosyn  [naproxen] Other (See Comments)   GI upset   Nsaids Other (See Comments)   GI upset   Propofol Other (See Comments)   Had horrible nightmares   Quinolones    Robinul  [glycopyrrolate] Nausea And Vomiting   Penicillin V Potassium Rash   Sulfa Antibiotics Rash        Medication List     STOP taking these medications    ciprofloxacin 500 MG tablet Commonly known as: CIPRO   metoprolol succinate 25 MG 24 hr tablet Commonly known as: TOPROL-XL   telmisartan-hydrochlorothiazide 80-25 MG tablet Commonly known as: MICARDIS HCT  TAKE these medications    Accu-Chek SmartView test strip Generic drug: glucose blood 1 each by Other route in the morning and at bedtime. Use as instructed   acetaminophen 500 MG tablet Commonly known as: TYLENOL Take 2 tablets by mouth as needed.   ALPRAZolam 0.5 MG tablet Commonly known as: XANAX Take 0.5 mg by mouth 2 (two) times daily as needed for anxiety or sleep.   apixaban 5 MG Tabs tablet Commonly known as: ELIQUIS Take 1 tablet (5 mg total) by mouth 2 (two) times daily.   busPIRone 5 MG tablet Commonly known as: BUSPAR Take 1 tablet (5 mg  total) by mouth 2 (two) times daily.   CALCIUM 500 PO Take by mouth.   clobetasol ointment 0.05 % Commonly known as: TEMOVATE Apply topically to affected area daily until improved.   digoxin 0.25 MG tablet Commonly known as: LANOXIN Take 1 tablet (0.25 mg total) by mouth daily. Start taking on: July 13, 2022   docusate sodium 50 MG capsule Commonly known as: COLACE Take 100 mg by mouth at bedtime.   FLAXSEED OIL PO Take 1,500 mg by mouth 2 (two) times daily.   Janumet 50-1000 MG tablet Generic drug: sitaGLIPtin-metformin TAKE ONE TABLET BY MOUTH TWICE DAILY What changed: when to take this   Jardiance 10 MG Tabs tablet Generic drug: empagliflozin TAKE 1 TABLET BY MOUTH EVERY DAY BEFORE BREAKFAST What changed: See the new instructions.   Linzess 145 MCG Caps capsule Generic drug: linaclotide TAKE 1 CAPSULE BY MOUTH EVERY DAY   meclizine 12.5 MG tablet Commonly known as: ANTIVERT Take 1 tablet (12.5 mg total) by mouth 3 (three) times daily as needed for dizziness.   Metoprolol Tartrate 75 MG Tabs Take 1 tablet (75 mg total) by mouth 2 (two) times daily.   MULTIVITAMIN ADULT PO Take 1 tablet by mouth daily.   pantoprazole 40 MG tablet Commonly known as: PROTONIX TAKE 1 TABLET BY MOUTH TWICE A DAY   venlafaxine XR 150 MG 24 hr capsule Commonly known as: EFFEXOR-XR Take 1 capsule (150 mg total) by mouth daily with breakfast.   venlafaxine XR 37.5 MG 24 hr capsule Commonly known as: EFFEXOR-XR Take 1 capsule (37.5 mg total) by mouth daily with breakfast. Take a total of 187.5 mg daily. Take along with 150 mg cap   Vitamin C Chew Chew by mouth. With vitamin D and elderberry       Allergies  Allergen Reactions   Aspirin Other (See Comments)    GI upset   Ibuprofen Other (See Comments)    GERD   Levofloxacin Other (See Comments)    Weight gain   Meloxicam Other (See Comments)    GI upset   Morphine Other (See Comments)   Naprosyn  [Naproxen] Other (See  Comments)    GI upset   Nsaids Other (See Comments)    GI upset   Propofol Other (See Comments)    Had horrible nightmares   Quinolones    Robinul  [Glycopyrrolate] Nausea And Vomiting   Penicillin V Potassium Rash   Sulfa Antibiotics Rash    Follow-up Information     Ronnell Freshwater, NP Follow up.   Specialty: Family Medicine Contact information: [place] [address] [phone]         Minna Merritts, MD Follow up.   Specialty: Cardiology Contact information: [address] [place] [address] [phone]  The results of significant diagnostics from this hospitalization (including imaging, microbiology, ancillary and laboratory) are listed below for reference.    Significant Diagnostic Studies: ECHOCARDIOGRAM COMPLETE  Result Date: 07/11/2022    ECHOCARDIOGRAM REPORT   Patient Name:   Lisa Gutierrez [facility] Date of Exam: 07/10/2022 Medical Rec #:  [id number]              Height:       66.0 in Accession #:    [id number]             Weight:       200.0 lb Date of Birth:  05-Mar-1951             BSA:          2.000 m Patient Age:    72 years               BP:           147/109 mmHg Patient Gender: F                      HR:           141 bpm. Exam Location:  [facility] Procedure: 2D Echo, Cardiac Doppler and Color Doppler Indications:     I48.91 Atrial Fibrillation  History:         Patient has no prior history of Echocardiogram examinations.                  Risk Factors:Diabetes and Hypertension.  Sonographer:     Cresenciano Lick RDCS Referring Phys:  [id number] SHERI HAMMOCK Diagnosing Phys: Kathlyn Sacramento MD IMPRESSIONS  1. Left ventricular ejection fraction, by estimation, is 50 to 55%. The left ventricle has low normal function. The left ventricle has no regional wall motion abnormalities. There is mild left ventricular hypertrophy. Left ventricular diastolic parameters are indeterminate.  2. Right ventricular  systolic function is normal. The right ventricular size is normal. Tricuspid regurgitation signal is inadequate for assessing PA pressure.  3. Left atrial size was moderately dilated.  4. The mitral valve is normal in structure. No evidence of mitral valve regurgitation. No evidence of mitral stenosis.  5. The aortic valve is normal in structure. Aortic valve regurgitation is not visualized. No aortic stenosis is present. FINDINGS  Left Ventricle: Left ventricular ejection fraction, by estimation, is 50 to 55%. The left ventricle has low normal function. The left ventricle has no regional wall motion abnormalities. The left ventricular internal cavity size was normal in size. There is mild left ventricular hypertrophy. Left ventricular diastolic parameters are indeterminate. Right Ventricle: The right ventricular size is normal. No increase in right ventricular wall thickness. Right ventricular systolic function is normal. Tricuspid regurgitation signal is inadequate for assessing PA pressure. Left Atrium: Left atrial size was moderately dilated. Right Atrium: Right atrial size was normal in size. Pericardium: There is no evidence of pericardial effusion. Mitral Valve: The mitral valve is normal in structure. No evidence of mitral valve regurgitation. No evidence of mitral valve stenosis. Tricuspid Valve: The tricuspid valve is normal in structure. Tricuspid valve regurgitation is not demonstrated. No evidence of tricuspid stenosis. Aortic Valve: The aortic valve is normal in structure. Aortic valve regurgitation is not visualized. No aortic stenosis is present. Pulmonic Valve: The pulmonic valve was normal in structure. Pulmonic valve regurgitation is not visualized. No evidence of pulmonic stenosis. Aorta: The aortic root is normal in size and structure. Venous: The inferior vena cava was not  well visualized. IAS/Shunts: No atrial level shunt detected by color flow Doppler.  LEFT VENTRICLE PLAX 2D LVIDd:          5.10 cm LVIDs:         3.70 cm LV PW:         1.10 cm LV IVS:        1.10 cm LVOT diam:     1.80 cm LVOT Area:     2.54 cm  RIGHT VENTRICLE RV S prime:     14.30 cm/s LEFT ATRIUM             Index        RIGHT ATRIUM           Index LA diam:        5.90 cm 2.95 cm/m   RA Area:     18.10 cm LA Vol (A2C):   83.0 ml 41.50 ml/m  RA Volume:   50.30 ml  25.15 ml/m LA Vol (A4C):   88.3 ml 44.15 ml/m LA Biplane Vol: 87.3 ml 43.65 ml/m   AORTA Ao Root diam: 3.30 cm Ao Asc diam:  3.70 cm MV E velocity: 70.48 cm/s                            SHUNTS                            Systemic Diam: 1.80 cm Kathlyn Sacramento MD Electronically signed by Kathlyn Sacramento MD Signature Date/Time: 07/11/2022/12:05:40 PM    Final    DG Chest Port 1 View  Result Date: 07/10/2022 CLINICAL DATA:  Chest pain, tachycardia EXAM: PORTABLE CHEST 1 VIEW COMPARISON:  None Available. FINDINGS: Single frontal view of the chest demonstrate an enlarged cardiac silhouette. There is mild central vascular prominence without focal airspace disease, effusion, or pneumothorax. No acute bony abnormalities. IMPRESSION: 1. Enlarged cardiac silhouette. 2. Mild central vascular prominence without overt edema. Electronically Signed   By: Randa Ngo M.D.   On: 07/10/2022 10:44    Microbiology: No results found for this or any previous visit (from the past 240 hour(s)).   Labs: Basic Metabolic Panel: Recent Labs  Lab 07/10/22 1017 07/11/22 0442  NA 137 136  K 3.6 3.7  CL 96* 98  CO2 25 23  GLUCOSE 175* 218*  BUN 12 12  CREATININE 0.67 0.65  CALCIUM 9.9 9.4  MG 2.1 1.8   Liver Function Tests: No results for input(s): "AST", "ALT", "ALKPHOS", "BILITOT", "PROT", "ALBUMIN" in the last 168 hours. No results for input(s): "LIPASE", "AMYLASE" in the last 168 hours. No results for input(s): "AMMONIA" in the last 168 hours. CBC: Recent Labs  Lab 07/10/22 1017 07/11/22 0442  WBC 8.5 9.3  HGB 15.1* 14.1  HCT 46.8* 42.9  MCV 91.9 92.7  PLT  225 214   Cardiac Enzymes: No results for input(s): "CKTOTAL", "CKMB", "CKMBINDEX", "TROPONINI" in the last 168 hours. BNP: BNP (last 3 results) No results for input(s): "BNP" in the last 8760 hours.  ProBNP (last 3 results) No results for input(s): "PROBNP" in the last 8760 hours.  CBG: Recent Labs  Lab 07/11/22 1629 07/11/22 2056 07/11/22 2342 07/12/22 0751 07/12/22 1141  GLUCAP 151* 238* 182* 137* 169*       Signed:  Desma Maxim MD.  [facility] 07/12/2022, 12:51 PM

## 2022-07-13 ENCOUNTER — Telehealth: Payer: Self-pay | Admitting: Cardiology

## 2022-07-13 ENCOUNTER — Telehealth: Payer: Self-pay

## 2022-07-13 MED ORDER — METOPROLOL TARTRATE 50 MG PO TABS: 75.0000 mg | ORAL_TABLET | Freq: Two times a day (BID) | ORAL | 0 refills | Status: DC

## 2022-07-13 NOTE — Telephone Encounter (Signed)
.  Pt c/o medication issue:  1. Name of Medication: Metoprolol Tartrate  2. How are you currently taking this medication (dosage and times per day)?   3. Are you having a reaction (difficulty breathing--STAT)?   4. What is your medication issue? Patient been in [facility]- she wants to know if she is supposed to be taking this medicine please?

## 2022-07-13 NOTE — Telephone Encounter (Signed)
Returned the call to the patient. She stated that she was recently discharged from the hospital on Metoprolol Tartrate 75 mg bid. She has not been able to pick this up.   Call placed to the pharmacy. They are closed until 2pm. Will call back.

## 2022-07-13 NOTE — Telephone Encounter (Signed)
Talked to the pharmacy. Metoprolol 75 mg bid was sent in. This is not a dosage that is easy to get per the pharmacy. This has been resent as a 50 mg tablet to take one and half twice daily.  The patient has verbalized her understanding. She will bring all her medications to her appointment on Monday.

## 2022-07-13 NOTE — Transitions of Care (Post Inpatient/ED Visit) (Signed)
   07/13/2022  Name: Dzaria Scheckel MRN: [id number] DOB: [birth year]  Today's TOC FU Call Status: Today's TOC FU Call Status:: Unsuccessul Call (1st Attempt) Unsuccessful Call (1st Attempt) Date: 07/13/22  Attempted to reach the patient regarding the most recent Inpatient/ED visit.  Follow Up Plan: Additional outreach attempts will be made to reach the patient to complete the Transitions of Care (Post Inpatient/ED visit) call.   Johnney Killian, RN, BSN, CCM Care Management Coordinator /[facility]

## 2022-07-16 ENCOUNTER — Ambulatory Visit: Payer: Medicare HMO | Attending: Cardiology | Admitting: Cardiology

## 2022-07-16 ENCOUNTER — Telehealth: Payer: Self-pay

## 2022-07-16 ENCOUNTER — Ambulatory Visit: Payer: Medicare HMO | Admitting: Nurse Practitioner

## 2022-07-16 VITALS — BP 154/101 | HR 92 | Ht 66.0 in | Wt 241.6 lb

## 2022-07-16 DIAGNOSIS — I4891 Unspecified atrial fibrillation: Secondary | ICD-10-CM

## 2022-07-16 DIAGNOSIS — E669 Obesity, unspecified: Secondary | ICD-10-CM | POA: Diagnosis not present

## 2022-07-16 DIAGNOSIS — F411 Generalized anxiety disorder: Secondary | ICD-10-CM

## 2022-07-16 DIAGNOSIS — R69 Illness, unspecified: Secondary | ICD-10-CM | POA: Diagnosis not present

## 2022-07-16 DIAGNOSIS — E1165 Type 2 diabetes mellitus with hyperglycemia: Secondary | ICD-10-CM

## 2022-07-16 DIAGNOSIS — I10 Essential (primary) hypertension: Secondary | ICD-10-CM | POA: Diagnosis not present

## 2022-07-16 DIAGNOSIS — F419 Anxiety disorder, unspecified: Secondary | ICD-10-CM

## 2022-07-16 MED ORDER — LOSARTAN POTASSIUM 25 MG PO TABS: 12.5000 mg | ORAL_TABLET | Freq: Every day | ORAL | 3 refills | Status: DC

## 2022-07-16 NOTE — Progress Notes (Signed)
Cardiology Office Note:   Date:  07/17/2022  ID:  Lisa Gutierrez, DOB November 18, 1950, MRN [id number]  History of Present Illness:   Lisa Gutierrez is a 72 y.o. female with a history of essential hypertension, chronic allergic rhinitis, GERD, IBS with constipation, chronic superficial gastritis without bleeding, fatty liver disease (nonalcoholic), type II diabetes, generalized anxiety disorder, insomnia, vertigo, history of statin intolerance, morbid obesity, who was recently hospitalized for new onset atrial fibrillation with RVR, who is here today to follow up on her atrial fibrillation.   She presented to the [facility] emergency department from the [facility] on 07/10/2022.  She had been scheduled for cardiac surgery and was evaluated by anesthesia prior to the procedure.  Anesthesia noted heart rates in the 170s and 190s question for atrial fibrillation with RVR on EKG.  At that time the placement was advised to be evaluated by the emergency department and her procedure was canceled for the day.  Even though she was found to be in A-fib RVR when she arrived in the emergency department she was a symptomatic.  She also denied any previous cardiac history.  Vital signs on arrival were blood pressure 149/113 with a pulse of 184.  Labs were pertinent for sodium 137 potassium 3.6 chloride 96 CO2 25, blood glucose 175, BUN 12, serum creatinine 0.67, hemoglobin 15.1 high-sensitivity troponin of 6, TSH is 0.908.  She was started on a diltiazem drip as well as a heparin drip and an echocardiogram was ordered.  Echo revealed an LVEF of 50-55%, no regional wall motion abnormalities, and no valvular abnormalities were noted.  She was started on metoprolol, digoxin, and apixaban.  She was considered stable for discharge on 07/12/2022.  She returns to clinic today still very anxious about atrial fibrillation.  She becomes tearful when she talks about the surgery center in [place] with her cataract surgery  she said it was scary and she is worried that the atrial fibrillation may do something worse to her.  She has been tolerating her medications well since discharge.  Blood pressure is slightly elevated today as her previous home blood pressure medications were placed on hold to allow for up titration of diltiazem and metoprolol during her hospitalization.  She has noticed no bleeding or increased amount of bruising with the apixaban.  She denies any shortness of breath, chest pain, palpitations, or peripheral edema.  She continues to remain asymptomatic to the A-fib that she is currently in today.  She does have several questions related to atrial fibrillation, side effects of medication, and cardioversion procedure.  She is also asking when she can have her cataract surgery to the other completed.  ROS: 10 point review of systems is negative with the exception of what is been listed in the HPI  Studies Reviewed:    EKG:  rate controlled atrial fibrillation rate 80-90, left axis deviation  TTE 07/10/22 1. Left ventricular ejection fraction, by estimation, is 50 to 55%. The  left ventricle has low normal function. The left ventricle has no regional  wall motion abnormalities. There is mild left ventricular hypertrophy.  Left ventricular diastolic  parameters are indeterminate.   2. Right ventricular systolic function is normal. The right ventricular  size is normal. Tricuspid regurgitation signal is inadequate for assessing  PA pressure.   3. Left atrial size was moderately dilated.   4. The mitral valve is normal in structure. No evidence of mitral valve  regurgitation. No evidence of mitral stenosis.  5. The aortic valve is normal in structure. Aortic valve regurgitation is  not visualized. No aortic stenosis is present.    Risk Assessment/Calculations:    CHA2DS2-VASc Score = 4   This indicates a 4.8% annual risk of stroke. The patient's score is based upon: CHF History: 0 HTN History:  1 Diabetes History: 1 Stroke History: 0 Vascular Disease History: 0 Age Score: 1 Gender Score: 1    HYPERTENSION CONTROL Vitals:   07/16/22 1515 07/16/22 1530  BP: (!) 140/94 (!) 154/101    The patient's blood pressure is elevated above target today.  In order to address the patient's elevated BP: A new medication was prescribed today.           Physical Exam:   VS:  BP (!) 154/101 (BP Location: Left Arm, Patient Position: Sitting, Cuff Size: Normal)   Pulse 92   Ht 5\' 6"  (1.676 m)   Wt 241 lb 9.6 oz (109.6 kg)   BMI 39.00 kg/m    Wt Readings from Last 3 Encounters:  07/16/22 241 lb 9.6 oz (109.6 kg)  07/12/22 200 lb 9.9 oz (91 kg)  06/29/22 200 lb (90.7 kg)     GEN: Well nourished, well developed in no acute distress NECK: No JVD; No carotid bruits CARDIAC: irregularly irregular, no murmurs, rubs, gallops RESPIRATORY:  Clear to auscultation without rales, wheezing or rhonchi  ABDOMEN: Soft, non-tender, non-distended EXTREMITIES:  trace pretibial edema; No deformity   ASSESSMENT AND PLAN:    Newly diagnosed atrial fibrillation with RVR with unknown duration given the lack of symptoms, has remained asymptomatic, and unfortunately required recent hospitalization.  Ventricular rates are well-controlled today in the 80s to 90s.  She has been continued on apixaban 5 mg twice daily for CHA2DS2-VASc score of at least 4.  She denies any bleeding or increased amount of bruising with being on oral anticoagulants.  She has been continued on metoprolol to tartrate 75 mg twice daily and dioxin 0.25 mg daily for rate control.  We have discussed that after being on oral anticoagulation for minimum of 4 weeks without interruption if she remains in atrial fibrillation with controlled heart rates that we would pursue the cardioversion procedure.  Risk/benefits/procedural steps were explained to the patient today.  She is also been advised of triggers of atrial fibrillation of stress, anemia,  caffeine, fatigue.  Essential hypertension with blood pressure today 140/94 and repeat blood pressure 154/101.  Previous home blood pressure medications were held during her hospitalization to allow for titration of diltiazem and metoprolol.  She has been continued on metoprolol 75 mg twice daily and started on losartan 12.5 mg daily.  She is encouraged to keep a blood pressure log at home taking her blood pressure 1 to 2 hours after she takes her medication.  She did bring her home blood pressure cuff with her today which was correlated with a manual pressure and was it has been 1-2 numbers of each other.  Type 2 diabetes she is continued on Jardiance and Janumet.  Continues to be managed by her PCP.  Anxiety and emotional distress.  Patient also seems to be suffering from PTSD after the death of her mother which she states is extremely limited as she had passed in her lab.  She does continue to see psychiatry and a counselor.  She believes that her current medication regimen of Effexor is not helping at this time.  She is also continued on Xanax on an as needed basis.  She  has been encouraged to follow-up with psychiatry and her counselor and to explain to them her concerns of the medications.  Obesity with a BMI of 39.  Recommended with weight management and encouraged to make dietary changes and increase in activity.  Disposition patient return to clinic to see MD/APP in the next 2 to 3 weeks to follow-up on her blood pressure and schedule her cardioversion procedure.        Signed, Sheera Illingworth, NP

## 2022-07-16 NOTE — Patient Instructions (Signed)
Please monitor blood pressures and keep a log of your readings.   Make sure to check 2 hours after your medications.   AVOID these things for 30 minutes before checking your blood pressure: No Drinking caffeine. No Drinking alcohol. No Eating. No Smoking. No Exercising.  Five minutes before checking your blood pressure: Pee. Sit in a dining chair. Avoid sitting in a soft couch or armchair. Be quiet. Do not talk.  Medication Instructions:  Your physician has recommended you make the following change in your medication:   START Losartan 12.5 mg once daily   *If you need a refill on your cardiac medications before your next appointment, please call your pharmacy*   Lab Work: None  If you have labs (blood work) drawn today and your tests are completely normal, you will receive your results only by: [place] (if you have MyChart) OR A paper copy in the mail If you have any lab test that is abnormal or we need to change your treatment, we will call you to review the results.   Testing/Procedures: None   Follow-Up: At [facility], you and your health needs are our priority.  As part of our continuing mission to provide you with exceptional heart care, we have created designated Provider Care Teams.  These Care Teams include your primary Cardiologist (physician) and Advanced Practice Providers (APPs -  Physician Assistants and Nurse Practitioners) who all work together to provide you with the care you need, when you need it.    Your next appointment:   3 week(s)  Provider:   Gerrie Nordmann, NP

## 2022-07-16 NOTE — Transitions of Care (Post Inpatient/ED Visit) (Signed)
   07/16/2022  Name: [name] MRN: [id number] DOB: 1950-11-26  Today's TOC FU Call Status: Today's TOC FU Call Status:: Unsuccessful Call (2nd Attempt) Unsuccessful Call (2nd Attempt) Date: 07/16/22  Attempted to reach the patient regarding the most recent Inpatient/ED visit.  Follow Up Plan: Additional outreach attempts will be made to reach the patient to complete the Transitions of Care (Post Inpatient/ED visit) call.   Johnney Killian, RN, BSN, CCM Care Management Coordinator [place]/[facility] Phone: [phone]: [phone]

## 2022-07-17 ENCOUNTER — Telehealth: Payer: Self-pay

## 2022-07-17 ENCOUNTER — Telehealth: Payer: Self-pay | Admitting: Cardiology

## 2022-07-17 ENCOUNTER — Encounter: Payer: Self-pay | Admitting: Cardiology

## 2022-07-17 ENCOUNTER — Telehealth: Payer: Self-pay | Admitting: *Deleted

## 2022-07-17 NOTE — Progress Notes (Signed)
Virtual Visit via Telephone Note  I connected with Lisa Gutierrez on 07/18/22 at  8:50 AM EDT by telephone and verified that I am speaking with the correct person using two identifiers.  Location: Patient: home Provider: [place] [facility] at [facility]     I discussed the limitations, risks, security and privacy concerns of performing an evaluation and management service by telephone and the availability of in person appointments. I also discussed with the patient that there may be a patient responsible charge related to this service. The patient expressed understanding and agreed to proceed.         Patient: Lisa Gutierrez   DOB: Oct 21, 1950   72 y.o. Female  MRN: [id number] Visit Date: 07/18/2022   No chief complaint on file.  Subjective    HPI     Medications: Outpatient Medications Prior to Visit  Medication Sig   acetaminophen (TYLENOL) 500 MG tablet Take 2 tablets by mouth as needed.   ALPRAZolam (XANAX) 0.5 MG tablet Take 0.5 mg by mouth 2 (two) times daily as needed for anxiety or sleep.   apixaban (ELIQUIS) 5 MG TABS tablet Take 1 tablet (5 mg total) by mouth 2 (two) times daily.   Bioflavonoid Products (VITAMIN C) CHEW Chew by mouth daily. With vitamin D and elderberry. 2 chews daily   busPIRone (BUSPAR) 5 MG tablet Take 1 tablet (5 mg total) by mouth 2 (two) times daily.   Calcium Carbonate (CALCIUM 500 PO) Take 500 mg by mouth 2 (two) times daily.   clobetasol ointment (TEMOVATE) 0.05 % Apply topically to affected area daily until improved.   digoxin (LANOXIN) 0.25 MG tablet Take 1 tablet (0.25 mg total) by mouth daily.   docusate sodium (COLACE) 50 MG capsule Take 100 mg by mouth at bedtime.   Flaxseed, Linseed, (FLAXSEED OIL PO) Take 1,500 mg by mouth 2 (two) times daily.   glucose blood (ACCU-CHEK SMARTVIEW) test strip 1 each by Other route in the morning and at bedtime. Use as instructed   JANUMET 50-1000 MG tablet TAKE ONE TABLET BY  MOUTH TWICE DAILY (Patient taking differently: Take 1 tablet by mouth 2 (two) times daily with a meal.)   JARDIANCE 10 MG TABS tablet TAKE 1 TABLET BY MOUTH EVERY DAY BEFORE BREAKFAST (Patient taking differently: Take 10 mg by mouth daily before breakfast.)   LINZESS 145 MCG CAPS capsule TAKE 1 CAPSULE BY MOUTH EVERY DAY   losartan (COZAAR) 25 MG tablet Take 0.5 tablets (12.5 mg total) by mouth daily.   meclizine (ANTIVERT) 12.5 MG tablet Take 1 tablet (12.5 mg total) by mouth 3 (three) times daily as needed for dizziness.   metoprolol tartrate (LOPRESSOR) 50 MG tablet Take 1.5 tablets (75 mg total) by mouth 2 (two) times daily.   Multiple Vitamins-Minerals (MULTIVITAMIN ADULT PO) Take 1 tablet by mouth daily.   pantoprazole (PROTONIX) 40 MG tablet TAKE 1 TABLET BY MOUTH TWICE A DAY (Patient taking differently: Take 40 mg by mouth 2 (two) times daily.)   venlafaxine XR (EFFEXOR-XR) 150 MG 24 hr capsule Take 1 capsule (150 mg total) by mouth daily with breakfast.   venlafaxine XR (EFFEXOR-XR) 37.5 MG 24 hr capsule Take 1 capsule (37.5 mg total) by mouth daily with breakfast. Take a total of 187.5 mg daily. Take along with 150 mg cap   No facility-administered medications prior to visit.    Review of Systems     Objective     Today's Vitals   07/18/22  0847  BP: 117/65  Pulse: 88  Weight: 241 lb (109.3 kg)  Height: 5\' 6"  (1.676 m)   Body mass index is 38.9 kg/m.  BP Readings from Last 3 Encounters:  07/18/22 117/65  07/16/22 (Abnormal) 154/101  07/12/22 116/83    Wt Readings from Last 3 Encounters:  07/18/22 241 lb (109.3 kg)  07/16/22 241 lb 9.6 oz (109.6 kg)  07/12/22 200 lb 9.9 oz (91 kg)    Physical Exam    No results found for any visits on 07/18/22.  Assessment & Plan    There are no diagnoses linked to this encounter.   Return in about 3 months (around 10/17/2022) for diabetes with HgbA1c check, urine microalbumin - needs MWV this year. .      I discussed the  assessment and treatment plan with the patient. The patient was provided an opportunity to ask questions and all were answered. The patient agreed with the plan and demonstrated an understanding of the instructions.   The patient was advised to call back or seek an in-person evaluation if the symptoms worsen or if the condition fails to improve as anticipated.  I provided  minutes of non-face-to-face time during this encounter.   Carlean Jews, NPEstablished patient visit   Carlean Jews, NP  [facility] at [facility] [phone] (phone) [phone] (fax)  [facility]

## 2022-07-17 NOTE — Telephone Encounter (Signed)
LVM to call office, she had LVM on our VM and wanted to see what she needed.  Looks like she had appt scheduled yesterday that got cancelled and rescheduled so that may be why and she may have called back.

## 2022-07-17 NOTE — Telephone Encounter (Signed)
Pt c/o medication issue:  1. Name of Medication:  losartan (COZAAR) 25 MG tablet  2. How are you currently taking this medication (dosage and times per day)?   3. Are you having a reaction (difficulty breathing--STAT)?   4. What is your medication issue?   Patient states she is unable to split the tablets in half because they are so small. She would like to discuss other options.

## 2022-07-17 NOTE — Telephone Encounter (Signed)
12.5 mg daily of Losartan. Keep blood pressure log at home with checking blood pressure 1-2 hours after medications have been taken.

## 2022-07-17 NOTE — Transitions of Care (Post Inpatient/ED Visit) (Signed)
   07/17/2022  Name: Whitnee Noblit MRN: [id number] DOB: 1950-10-21  Today's TOC FU Call Status: Today's TOC FU Call Status:: Successful TOC FU Call Competed TOC FU Call Complete Date: 07/17/22  Transition Care Management Follow-up Telephone Call Date of Discharge: 07/12/22 Discharge Facility: [facility]) Type of Discharge: Inpatient Admission Primary Inpatient Discharge Diagnosis:: Atrial Fibrillation How have you been since you were released from the hospital?: Better  Items Reviewed:  Patient answered phone for first call this morning and she was headed out the door to appointment.  Requested a call back this afternoon and patient did not answer phone. Home Care and Equipment/Supplies: No home health ordered  Functional Questionnaire: Unable to complete  Follow up appointments reviewed: 07/18/22 @0850 - [name], NP   Johnney Killian, RN, BSN, CCM Care Management Coordinator [facility] Phone: [phone]: [phone]

## 2022-07-18 ENCOUNTER — Encounter: Payer: Self-pay | Admitting: Ophthalmology

## 2022-07-18 ENCOUNTER — Encounter: Payer: Self-pay | Admitting: Nurse Practitioner

## 2022-07-18 ENCOUNTER — Other Ambulatory Visit: Payer: Self-pay | Admitting: Nurse Practitioner

## 2022-07-18 ENCOUNTER — Encounter: Payer: Medicare HMO | Admitting: Nurse Practitioner

## 2022-07-18 VITALS — BP 117/65 | HR 88 | Ht 66.0 in | Wt 241.0 lb

## 2022-07-18 DIAGNOSIS — K219 Gastro-esophageal reflux disease without esophagitis: Secondary | ICD-10-CM

## 2022-07-18 MED ORDER — LOSARTAN POTASSIUM 25 MG PO TABS: 25.0000 mg | ORAL_TABLET | Freq: Every day | ORAL | 3 refills | Status: DC

## 2022-07-18 NOTE — Telephone Encounter (Signed)
Spoke with patient and reviewed recommendations from provider. She verbalized understanding with no further questions at this time. Updated prescription in chart.

## 2022-07-18 NOTE — Telephone Encounter (Signed)
Verbal discussion with provider and reviewed patient unable to cut pill in half. She provided verbal orders for patient to take a whole pill Losartan 25 mg once daily. Will reach out to patient to review these recommendations.

## 2022-07-18 NOTE — Anesthesia Preprocedure Evaluation (Addendum)
Anesthesia Evaluation  Patient identified by MRN, date of birth, ID band Patient awake    Reviewed: Allergy & Precautions, H&P , NPO status , Patient's Chart, lab work & pertinent test results  Airway Mallampati: IV  TM Distance: <3 FB     Dental no notable dental hx.    Pulmonary neg pulmonary ROS, former smoker   Pulmonary exam normal        Cardiovascular hypertension, + dysrhythmias Atrial Fibrillation  Rhythm:Irregular Rate:Abnormal  A fib Echo Jun 12, 2022 EF 50-55%, mid LVH, LA moderately dilated   Neuro/Psych  PSYCHIATRIC DISORDERS Anxiety Depression    vertigo negative neurological ROS  negative psych ROS   GI/Hepatic negative GI ROS, Neg liver ROS,GERD  Controlled and Medicated,,IBS   Endo/Other  negative endocrine ROSdiabetes    Renal/GU negative Renal ROS  negative genitourinary   Musculoskeletal negative musculoskeletal ROS (+)    Abdominal  (+) + obese  Peds negative pediatric ROS (+)  Hematology negative hematology ROS (+)   Anesthesia Other Findings Hypertension  GERD (gastroesophageal reflux disease) Depression  Neuromuscular disorder Anxiety  IBS (irritable bowel syndrome) Diabetes mellitus without complicationDepression Atrial fibrillation  Note hx nightmares w/propofol     Reproductive/Obstetrics negative OB ROS                             Anesthesia Physical Anesthesia Plan  ASA: 3  Anesthesia Plan: MAC   Post-op Pain Management:    Induction: Intravenous  PONV Risk Score and Plan:   Airway Management Planned: Natural Airway and Nasal Cannula  Additional Equipment:   Intra-op Plan:   Post-operative Plan:   Informed Consent: I have reviewed the patients History and Physical, chart, labs and discussed the procedure including the risks, benefits and alternatives for the proposed anesthesia with the patient or authorized representative who has  indicated his/her understanding and acceptance.     Dental Advisory Given  Plan Discussed with: Anesthesiologist, CRNA and Surgeon  Anesthesia Plan Comments: (Patient consented for risks of anesthesia including but not limited to:  - adverse reactions to medications - damage to eyes, teeth, lips or other oral mucosa - nerve damage due to positioning  - sore throat or hoarseness - Damage to heart, brain, nerves, lungs, other parts of body or loss of life  Patient voiced understanding.)       Anesthesia Quick Evaluation

## 2022-07-20 ENCOUNTER — Telehealth: Payer: Self-pay | Admitting: Cardiology

## 2022-07-20 ENCOUNTER — Ambulatory Visit (INDEPENDENT_AMBULATORY_CARE_PROVIDER_SITE_OTHER): Payer: Medicare HMO | Admitting: Licensed Clinical Social Worker

## 2022-07-20 DIAGNOSIS — R69 Illness, unspecified: Secondary | ICD-10-CM | POA: Diagnosis not present

## 2022-07-20 DIAGNOSIS — F330 Major depressive disorder, recurrent, mild: Secondary | ICD-10-CM

## 2022-07-20 NOTE — Telephone Encounter (Signed)
Patient would like to know if there is a medication that she can take for constipation. She assumes the medications she currently takes may be causing the constipatation. Please advise.

## 2022-07-20 NOTE — Telephone Encounter (Signed)
Spoke to patient in regards to her constipation. Patient stated that she thinks her OTC Calcium Carbonate (CALCIUM 500 PO) 500 mg, 2 times daily tablet is the cause of it. Informed patient that this clinic didn't prescribe it to her so she can stop it as she wishes. Patient asked for OTC medications that could help with her constipation. Informed patient to speak with pharmacist about Miralax, magnesium citrate, colace, dulcolax and/or Senokot. Patient was satisfied with response

## 2022-07-20 NOTE — Progress Notes (Signed)
Virtual Visit via Audio Note  I connected with Lisa Gutierrez on 07/20/2022 at 11:00 AM EDT by an audio enabled telemedicine application and verified that I am speaking with the correct person using two identifiers.  Location: Patient: home Provider: remote office [place], [place])   I discussed the limitations of evaluation and management by telemedicine and the availability of in person appointments. The patient expressed understanding and agreed to proceed.   I discussed the assessment and treatment plan with the patient. The patient was provided an opportunity to ask questions and all were answered. The patient agreed with the plan and demonstrated an understanding of the instructions.   The patient was advised to call back or seek an in-person evaluation if the symptoms worsen or if the condition fails to improve as anticipated.  I provided 43 minutes of non-face-to-face time during this encounter.   Delaney Perona R Miroslav [name], LCSW   THERAPIST PROGRESS NOTE  Session Time: 11-1143a  Participation Level: Active  Behavioral Response: NAAlertDepressed and Dysphoric  Type of Therapy: Individual Therapy  Treatment Goals addressed: Problem: Decrease depressive symptoms and improve levels of effective functioning Goal: LTG: Reduce frequency, intensity, and duration of depression symptoms as evidenced by: SSB input needed on appropriate metric Outcome: Progressing Goal: STG: @PREFFIRSTNAME @ will participate in at least 80% of scheduled individual psychotherapy sessions Outcome: Progressing Intervention: Encourage verbalization of feelings/concerns/expectations Note: Allowed pt to explore/express--explored current coping skills  ProgressTowards Goals: Progressing  Interventions: CBT and Supportive (see above)  Summary: Lisa Gutierrez is a 72 y.o. female who presents with continuing symptoms related to depression.  Patient reports that she is compliant with her medication,  and is compliant with her psychiatric visits  Allowed pt to explore and express thoughts and feelings associated with recent life situations and external stressors.  Patient reports that one of her most recent stressors is being diagnosed with atrial fibrillation.  Patient states that her husband was a good support system for her throughout that diagnosis and testing period.  Patient states that she was admitted to the hospital for observation for several days.  Patient states that they put her on medication, and she does feel physically better since starting the new medication.  Patient states that she is having fewer episodes of depression, but is still having some breakthrough anxiety at times.  Allowed patient to identify specific anxiety triggers, and identify several individual scenarios where patient felt nervous, anxious, or panicky.  Reviewed anxiety management techniques with patient, and allowed her to identify current coping skills that she is using to manage her own symptoms in the moment.  Patient made the statement that her husband is refusing to pay for any additional psychiatric treatment, so any future counseling sessions will be on an as needed basis.  Patient will call back to schedule future appointments  Continued recommendations are as follows: self care behaviors, positive social engagements, focusing on overall work/home/life balance, and focusing on positive physical and emotional wellness.   Suicidal/Homicidal: Yes.  Thoughts with no plans or intent. Pt reported plan but said that she was "just testing husband".   Patient states that she made the comment to her husband "I'll just get the gun and shoot myself".  Patient states that she does not want to actually harm herself, but she made the comment to see what her husband would say.  Patient states that her husband made the comment "well I guess your psychiatrist and counselor are not doing their jobs then"  Therapist  Response: Pt is continuing to apply interventions learned in session into daily life situations. Pt is currently on track to meet goals utilizing interventions mentioned above. Personal growth and progress noted. Treatment to continue as indicated.   Plan: Return again 4 weeks or PRN.  Diagnosis:  Encounter Diagnosis  Name Primary?   MDD (major depressive disorder), recurrent episode, mild Yes    Collaboration of Care: Other pt encouraged to continue with psychiatrist of record, Dr. Vanetta Shawl  Patient/Guardian was advised Release of Information must be obtained prior to any record release in order to collaborate their care with an outside provider. Patient/Guardian was advised if they have not already done so to contact the registration department to sign all necessary forms in order for [place] to release information regarding their care.   Consent: Patient/Guardian gives verbal consent for treatment and assignment of benefits for services provided during this visit. Patient/Guardian expressed understanding and agreed to proceed.   Cristiana Yochim R Aminta Sakurai, LCSW 07/20/2022.

## 2022-07-23 NOTE — Discharge Instructions (Signed)

## 2022-07-24 ENCOUNTER — Ambulatory Visit
Admission: RE | Admit: 2022-07-24 | Discharge: 2022-07-24 | Disposition: A | Discharge status: Home / Self Care | Payer: Medicare HMO | Attending: Ophthalmology | Admitting: Ophthalmology

## 2022-07-24 ENCOUNTER — Ambulatory Visit: Payer: Medicare HMO | Admitting: Anesthesiology

## 2022-07-24 ENCOUNTER — Other Ambulatory Visit: Payer: Self-pay

## 2022-07-24 ENCOUNTER — Encounter
Admission: RE | Disposition: A | Discharge status: Home / Self Care | Payer: Self-pay | Source: Home / Self Care | Attending: Ophthalmology

## 2022-07-24 DIAGNOSIS — Z833 Family history of diabetes mellitus: Secondary | ICD-10-CM | POA: Insufficient documentation

## 2022-07-24 DIAGNOSIS — Z6839 Body mass index (BMI) 39.0-39.9, adult: Secondary | ICD-10-CM | POA: Diagnosis not present

## 2022-07-24 DIAGNOSIS — I10 Essential (primary) hypertension: Secondary | ICD-10-CM | POA: Diagnosis not present

## 2022-07-24 DIAGNOSIS — H2511 Age-related nuclear cataract, right eye: Secondary | ICD-10-CM | POA: Diagnosis not present

## 2022-07-24 DIAGNOSIS — Z8249 Family history of ischemic heart disease and other diseases of the circulatory system: Secondary | ICD-10-CM | POA: Diagnosis not present

## 2022-07-24 DIAGNOSIS — E1136 Type 2 diabetes mellitus with diabetic cataract: Secondary | ICD-10-CM | POA: Diagnosis not present

## 2022-07-24 DIAGNOSIS — K219 Gastro-esophageal reflux disease without esophagitis: Secondary | ICD-10-CM | POA: Insufficient documentation

## 2022-07-24 DIAGNOSIS — E669 Obesity, unspecified: Secondary | ICD-10-CM | POA: Diagnosis not present

## 2022-07-24 DIAGNOSIS — I4891 Unspecified atrial fibrillation: Secondary | ICD-10-CM | POA: Diagnosis not present

## 2022-07-24 DIAGNOSIS — Z87891 Personal history of nicotine dependence: Secondary | ICD-10-CM | POA: Diagnosis not present

## 2022-07-24 DIAGNOSIS — I499 Cardiac arrhythmia, unspecified: Secondary | ICD-10-CM | POA: Diagnosis not present

## 2022-07-24 DIAGNOSIS — K589 Irritable bowel syndrome without diarrhea: Secondary | ICD-10-CM | POA: Diagnosis not present

## 2022-07-24 HISTORY — DX: Unspecified atrial fibrillation: I48.91

## 2022-07-24 HISTORY — PX: CATARACT EXTRACTION W/PHACO: SHX586

## 2022-07-24 LAB — GLUCOSE, CAPILLARY: Glucose-Capillary: 180 mg/dL — ABNORMAL HIGH (ref 70–99)

## 2022-07-24 SURGERY — PHACOEMULSIFICATION, CATARACT, WITH IOL INSERTION
Anesthesia: Monitor Anesthesia Care | Site: Eye | Laterality: Right

## 2022-07-24 MED ORDER — TETRACAINE HCL 0.5 % OP SOLN
1.0000 [drp] | OPHTHALMIC | Status: DC | PRN
  Administered 2022-07-24 (×3): 1 [drp] via OPHTHALMIC

## 2022-07-24 MED ORDER — BRIMONIDINE TARTRATE-TIMOLOL 0.2-0.5 % OP SOLN
OPHTHALMIC | Status: DC | PRN
  Administered 2022-07-24: 1 [drp] via OPHTHALMIC

## 2022-07-24 MED ORDER — SIGHTPATH DOSE#1 BSS IO SOLN
INTRAOCULAR | Status: DC | PRN
  Administered 2022-07-24: 15 mL

## 2022-07-24 MED ORDER — ARMC OPHTHALMIC DILATING DROPS
1.0000 | OPHTHALMIC | Status: DC | PRN
  Administered 2022-07-24 (×3): 1 via OPHTHALMIC

## 2022-07-24 MED ORDER — SIGHTPATH DOSE#1 BSS IO SOLN
INTRAOCULAR | Status: DC | PRN
  Administered 2022-07-24: 63 mL via OPHTHALMIC

## 2022-07-24 MED ORDER — SIGHTPATH DOSE#1 BSS IO SOLN
INTRAOCULAR | Status: DC | PRN
  Administered 2022-07-24: 1 mL via INTRAMUSCULAR

## 2022-07-24 MED ORDER — SIGHTPATH DOSE#1 NA CHONDROIT SULF-NA HYALURON 40-17 MG/ML IO SOLN
INTRAOCULAR | Status: DC | PRN
  Administered 2022-07-24: 1 mL via INTRAOCULAR

## 2022-07-24 MED ORDER — MOXIFLOXACIN HCL 0.5 % OP SOLN
OPHTHALMIC | Status: DC | PRN
  Administered 2022-07-24: .2 mL via OPHTHALMIC

## 2022-07-24 MED ORDER — LACTATED RINGERS IV SOLN: INTRAVENOUS | Status: DC

## 2022-07-24 MED ORDER — MIDAZOLAM HCL 2 MG/2ML IJ SOLN
INTRAMUSCULAR | Status: DC | PRN
  Administered 2022-07-24: 2 mg via INTRAVENOUS

## 2022-07-24 SURGICAL SUPPLY — 8 items
CATARACT SUITE SIGHTPATH (MISCELLANEOUS) ×1 IMPLANT
FEE CATARACT SUITE SIGHTPATH (MISCELLANEOUS) ×1 IMPLANT
GLOVE BIOGEL PI IND STRL 8 (GLOVE) ×1 IMPLANT
GLOVE SURG ENC TEXT LTX SZ8 (GLOVE) ×1 IMPLANT
LENS IOL TECNIS EYHANCE 20.0 (Intraocular Lens) IMPLANT
NDL FILTER BLUNT 18X1 1/2 (NEEDLE) ×1 IMPLANT
NEEDLE FILTER BLUNT 18X1 1/2 (NEEDLE) ×1 IMPLANT
SYR 3ML LL SCALE MARK (SYRINGE) ×1 IMPLANT

## 2022-07-24 NOTE — H&P (Signed)
[facility]   Primary Care Physician:  Carlean Jews, NP Ophthalmologist: Dr. Druscilla Brownie  Pre-Procedure History & Physical: HPI:  Lisa Gutierrez is a 72 y.o. female here for cataract surgery.   Past Medical History:  Diagnosis Date   Anxiety    Atrial fibrillation    Depression    Depression    Phreesia 07/02/2020   Diabetes mellitus without complication    GERD (gastroesophageal reflux disease)    Hypertension    IBS (irritable bowel syndrome)    Neuromuscular disorder     Past Surgical History:  Procedure Laterality Date   BREAST EXCISIONAL BIOPSY     CESAREAN SECTION N/A    Phreesia 07/02/2020   CHOLECYSTECTOMY     EXCISION / BIOPSY BREAST / NIPPLE / DUCT Right 1973   duct removed   SPINE SURGERY N/A    Phreesia 07/02/2020   TUBAL LIGATION N/A    Phreesia 07/02/2020    Prior to Admission medications   Medication Sig Start Date End Date Taking? Authorizing Provider  acetaminophen (TYLENOL) 500 MG tablet Take 2 tablets by mouth as needed.   Yes [provider]  ALPRAZolam Prudy Feeler) 0.5 MG tablet Take 0.5 mg by mouth 2 (two) times daily as needed for anxiety or sleep. 03/28/22  Yes [provider]  apixaban (ELIQUIS) 5 MG TABS tablet Take 1 tablet (5 mg total) by mouth 2 (two) times daily. 07/12/22  Yes Wouk, Wilfred Curtis, MD  busPIRone (BUSPAR) 5 MG tablet Take 1 tablet (5 mg total) by mouth 2 (two) times daily. 06/23/22 09/21/22 Yes Hisada, Barbee Cough, MD  Calcium Carbonate (CALCIUM 500 PO) Take 500 mg by mouth 2 (two) times daily.   Yes [provider]  digoxin (LANOXIN) 0.25 MG tablet Take 1 tablet (0.25 mg total) by mouth daily. 07/13/22  Yes Wouk, Wilfred Curtis, MD  docusate sodium (COLACE) 50 MG capsule Take 100 mg by mouth at bedtime.   Yes [provider]  Flaxseed, Linseed, (FLAXSEED OIL PO) Take 1,500 mg by mouth 2 (two) times daily.   Yes [provider]  JANUMET 50-1000 MG tablet TAKE ONE TABLET BY MOUTH TWICE  DAILY Patient taking differently: Take 1 tablet by mouth 2 (two) times daily with a meal. 03/29/22  Yes Boscia, Heather E, NP  JARDIANCE 10 MG TABS tablet TAKE 1 TABLET BY MOUTH EVERY DAY BEFORE BREAKFAST Patient taking differently: Take 10 mg by mouth daily before breakfast. 06/26/21  Yes Boscia, Kathlynn Grate, NP  LINZESS 145 MCG CAPS capsule TAKE 1 CAPSULE BY MOUTH EVERY DAY 11/09/21  Yes Boscia, Heather E, NP  losartan (COZAAR) 25 MG tablet Take 1 tablet (25 mg total) by mouth daily. 07/18/22 10/16/22 Yes Hammock, Lavonna Rua, NP  meclizine (ANTIVERT) 12.5 MG tablet Take 1 tablet (12.5 mg total) by mouth 3 (three) times daily as needed for dizziness. 06/26/22  Yes Immordino, Jeannett Senior, FNP  metoprolol tartrate (LOPRESSOR) 50 MG tablet Take 1.5 tablets (75 mg total) by mouth 2 (two) times daily. 07/13/22 10/11/22 Yes Iran Ouch, MD  Multiple Vitamins-Minerals (MULTIVITAMIN ADULT PO) Take 1 tablet by mouth daily.   Yes [provider]  pantoprazole (PROTONIX) 40 MG tablet TAKE 1 TABLET BY MOUTH TWICE A DAY 07/18/22  Yes Carlean Jews, NP  venlafaxine XR (EFFEXOR-XR) 150 MG 24 hr capsule Take 1 capsule (150 mg total) by mouth daily with breakfast. 05/26/22 08/24/22 Yes Hisada, Barbee Cough, MD  Bioflavonoid Products (VITAMIN C) CHEW Chew by mouth daily. With vitamin  D and elderberry. 2 chews daily    [provider]  clobetasol ointment (TEMOVATE) 0.05 % Apply topically to affected area daily until improved. 06/06/22   [provider]  glucose blood (ACCU-CHEK SMARTVIEW) test strip 1 each by Other route in the morning and at bedtime. Use as instructed 05/01/22   Carlean Jews, NP  venlafaxine XR (EFFEXOR-XR) 37.5 MG 24 hr capsule Take 1 capsule (37.5 mg total) by mouth daily with breakfast. Take a total of 187.5 mg daily. Take along with 150 mg cap 06/15/22 08/14/22  [name], MD    Allergies as of 07/18/2022 - Review Complete 07/18/2022  Allergen Reaction Noted   Aspirin Other (See  Comments) 12/28/2014   Ibuprofen Other (See Comments) 12/28/2014   Levofloxacin Other (See Comments) 12/28/2014   Meloxicam Other (See Comments) 12/28/2014   Morphine Other (See Comments) 12/28/2014   Naprosyn  [naproxen] Other (See Comments) 12/28/2014   Nsaids Other (See Comments) 12/28/2014   Propofol Other (See Comments) 06/29/2022   Quinolones  12/28/2014   Robinul  [glycopyrrolate] Nausea And Vomiting 12/28/2014   Penicillin v potassium Rash 12/28/2014   Sulfa antibiotics Rash 12/28/2014    Family History  Problem Relation Age of Onset   Diabetes Mother    Hypertension Mother    Coronary artery disease Father    Glaucoma Father    Breast cancer Neg Hx     Social History   Socioeconomic History   Marital status: Married    Spouse name: Not on file   Number of children: Not on file   Years of education: Not on file   Highest education level: Not on file  Occupational History   Not on file  Tobacco Use   Smoking status: Former   Smokeless tobacco: Never  Substance and Sexual Activity   Alcohol use: No    Alcohol/week: 0.0 standard drinks of alcohol   Drug use: Never   Sexual activity: Not Currently    Partners: Male  Other Topics Concern   Not on file  Social History Narrative   Not on file   Social Determinants of Health   Financial Resource Strain: Not on file  Food Insecurity: No Food Insecurity (07/11/2022)   Hunger Vital Sign    Worried About Running Out of Food in the Last Year: Never true    Ran Out of Food in the Last Year: Never true  Transportation Needs: No Transportation Needs (07/11/2022)   PRAPARE - Administrator, Civil Service (Medical): No    Lack of Transportation (Non-Medical): No  Physical Activity: Not on file  Stress: Not on file  Social Connections: Not on file  Intimate Partner Violence: Not At Risk (07/11/2022)   Humiliation, Afraid, Rape, and Kick questionnaire    Fear of Current or Ex-Partner: No    Emotionally  Abused: No    Physically Abused: No    Sexually Abused: No    Review of Systems: See HPI, otherwise negative ROS  Physical Exam: BP (!) 139/99   Pulse (!) 101   Temp (!) 97.5 F (36.4 C) (Temporal)   Resp (!) 22   Ht 5\' 5"  (1.651 m)   Wt 108.4 kg   SpO2 94%   BMI 39.77 kg/m  General:   Alert, cooperative in NAD Head:  Normocephalic and atraumatic. Respiratory:  Normal work of breathing. Cardiovascular:  RRR  Impression/Plan: Lisa Gutierrez is here for cataract surgery.  Risks, benefits, limitations, and alternatives regarding cataract  surgery have been reviewed with the patient.  Questions have been answered.  All parties agreeable.   Galen ManilaWilliam Audrielle Vankuren, MD  07/24/2022, 8:24 AM

## 2022-07-24 NOTE — Transfer of Care (Signed)
Immediate Anesthesia Transfer of Care Note  Patient: Lisa Gutierrez  Procedure(s) Performed: CATARACT EXTRACTION PHACO AND INTRAOCULAR LENS PLACEMENT (IOC) RIGHT DIABETIC  5.93  00:42.5 (Right: Eye)  Patient Location: PACU  Anesthesia Type: MAC  Level of Consciousness: awake, alert  and patient cooperative  Airway and Oxygen Therapy: Patient Spontanous Breathing and Patient connected to supplemental oxygen  Post-op Assessment: Post-op Vital signs reviewed, Patient's Cardiovascular Status Stable, Respiratory Function Stable, Patent Airway and No signs of Nausea or vomiting  Post-op Vital Signs: Reviewed and stable  Complications: No notable events documented.

## 2022-07-24 NOTE — Op Note (Signed)
PREOPERATIVE DIAGNOSIS:  Nuclear sclerotic cataract of the right eye.   POSTOPERATIVE DIAGNOSIS:  H25.11 Cataract   OPERATIVE PROCEDURE:ORPROCALL@   SURGEON:  Galen Manila, MD.   ANESTHESIA:  Anesthesiologist: Marisue Humble, MD CRNA: Domenic Moras, CRNA  1.      Managed anesthesia care. 2.      0.84ml of Shugarcaine was instilled in the eye following the paracentesis.   COMPLICATIONS:  None.   TECHNIQUE:   Stop and chop   DESCRIPTION OF PROCEDURE:  The patient was examined and consented in the preoperative holding area where the aforementioned topical anesthesia was applied to the right eye and then brought back to the Operating Room where the right eye was prepped and draped in the usual sterile ophthalmic fashion and a lid speculum was placed. A paracentesis was created with the side port blade and the anterior chamber was filled with viscoelastic. A near clear corneal incision was performed with the steel keratome. A continuous curvilinear capsulorrhexis was performed with a cystotome followed by the capsulorrhexis forceps. Hydrodissection and hydrodelineation were carried out with BSS on a blunt cannula. The lens was removed in a stop and chop  technique and the remaining cortical material was removed with the irrigation-aspiration handpiece. The capsular bag was inflated with viscoelastic and the Technis [id number]  lens was placed in the capsular bag without complication. The remaining viscoelastic was removed from the eye with the irrigation-aspiration handpiece. The wounds were hydrated. The anterior chamber was flushed with BSS and the eye was inflated to physiologic pressure. 0.8ml of Vigamox was placed in the anterior chamber. The wounds were found to be water tight. The eye was dressed with Combigan. The patient was given protective glasses to wear throughout the day and a shield with which to sleep tonight. The patient was also given drops with which to begin a drop regimen today  and will follow-up with me in one day. Implant Name Type Inv. Item Serial No. Manufacturer Lot No. LRB No. Used Action  LENS IOL TECNIS EYHANCE 20.0 - [id number] Intraocular Lens LENS IOL TECNIS EYHANCE 20.0 [id number] [employer]  Right 1 Implanted   Procedure(s): CATARACT EXTRACTION PHACO AND INTRAOCULAR LENS PLACEMENT (IOC) RIGHT DIABETIC  5.93  00:42.5 (Right)  Electronically signed: Galen Manila 07/24/2022 8:49 AM

## 2022-07-24 NOTE — Anesthesia Postprocedure Evaluation (Signed)
Anesthesia Post Note  Patient: Lisa Gutierrez  Procedure(s) Performed: CATARACT EXTRACTION PHACO AND INTRAOCULAR LENS PLACEMENT (IOC) RIGHT DIABETIC  5.93  00:42.5 (Right: Eye)  Anesthesia Type: MAC Anesthetic complications: no   No notable events documented.   Last Vitals:  Vitals:   07/24/22 0851 07/24/22 0855  BP: (!) 126/98 117/87  Pulse: 93 93  Resp: 19 15  Temp: 36.8 C   SpO2: 93% 94%    Last Pain:  Vitals:   07/24/22 0855  TempSrc:   PainSc: 0-No pain                 Macon Lesesne C [name] Anand

## 2022-07-25 ENCOUNTER — Encounter: Payer: Self-pay | Admitting: Ophthalmology

## 2022-07-31 NOTE — Telephone Encounter (Signed)
Blood pressures are better than before. Heart rate is about the same. The symptoms of tiredness and fatigue can be related to some of the medication and the atrial fibrillation in general. It can make you tired, weak, and even short of breath. With all of these new medications the side effects usually improve after about 2 weeks. If we need to change them around on your return we can discuss doing that.

## 2022-08-01 NOTE — Progress Notes (Deleted)
BH MD/PA/NP OP Progress Note  08/01/2022 8:01 AM Lisa Gutierrez  MRN:  [id number]  Chief Complaint: No chief complaint on file.  HPI:  - she had a cataract surgery since the last visit - she went to ED due to newly found Afib with RVR. She was started on digoxin, propranolol, apixaban  Visit Diagnosis: No diagnosis found.  Past Psychiatric History: Please see initial evaluation for full details. I have reviewed the history. No updates at this time.     Past Medical History:  Past Medical History:  Diagnosis Date   Anxiety    Atrial fibrillation    Depression    Depression    Phreesia 07/02/2020   Diabetes mellitus without complication    GERD (gastroesophageal reflux disease)    Hypertension    IBS (irritable bowel syndrome)    Neuromuscular disorder     Past Surgical History:  Procedure Laterality Date   BREAST EXCISIONAL BIOPSY     CATARACT EXTRACTION W/PHACO Right 07/24/2022   Procedure: CATARACT EXTRACTION PHACO AND INTRAOCULAR LENS PLACEMENT (IOC) RIGHT DIABETIC  5.93  00:42.5;  Surgeon: Galen Manila, MD;  Location: [facility];  Service: Ophthalmology;  Laterality: Right;   CESAREAN SECTION N/A    Phreesia 07/02/2020   CHOLECYSTECTOMY     EXCISION / BIOPSY BREAST / NIPPLE / DUCT Right 1973   duct removed   SPINE SURGERY N/A    Phreesia 07/02/2020   TUBAL LIGATION N/A    Phreesia 07/02/2020    Family Psychiatric History: Please see initial evaluation for full details. I have reviewed the history. No updates at this time.     Family History:  Family History  Problem Relation Age of Onset   Diabetes Mother    Hypertension Mother    Coronary artery disease Father    Glaucoma Father    Breast cancer Neg Hx     Social History:  Social History   Socioeconomic History   Marital status: Married    Spouse name: Not on file   Number of children: Not on file   Years of education: Not on file   Highest education level: Not on file   Occupational History   Not on file  Tobacco Use   Smoking status: Former   Smokeless tobacco: Never  Substance and Sexual Activity   Alcohol use: No    Alcohol/week: 0.0 standard drinks of alcohol   Drug use: Never   Sexual activity: Not Currently    Partners: Male  Other Topics Concern   Not on file  Social History Narrative   Not on file   Social Determinants of Health   Financial Resource Strain: Not on file  Food Insecurity: No Food Insecurity (07/11/2022)   Hunger Vital Sign    Worried About Running Out of Food in the Last Year: Never true    Ran Out of Food in the Last Year: Never true  Transportation Needs: No Transportation Needs (07/11/2022)   PRAPARE - Administrator, Civil Service (Medical): No    Lack of Transportation (Non-Medical): No  Physical Activity: Not on file  Stress: Not on file  Social Connections: Not on file    Allergies:  Allergies  Allergen Reactions   Aspirin Other (See Comments)    GI upset   Ibuprofen Other (See Comments)    GERD   Levofloxacin Other (See Comments)    Weight gain   Meloxicam Other (See Comments)    GI upset  Morphine Other (See Comments)   Naprosyn  [Naproxen] Other (See Comments)    GI upset   Nsaids Other (See Comments)    GI upset   Propofol Other (See Comments)    Had horrible nightmares   Quinolones    Robinul  [Glycopyrrolate] Nausea And Vomiting   Penicillin V Potassium Rash   Sulfa Antibiotics Rash    Metabolic Disorder Labs: Lab Results  Component Value Date   HGBA1C 7.5 (H) 07/10/2022   MPG 169 07/10/2022   No results found for: "PROLACTIN" Lab Results  Component Value Date   CHOL 195 08/24/2021   TRIG 233 (H) 08/24/2021   HDL 31 (L) 08/24/2021   CHOLHDL 6.3 (H) 08/24/2021   LDLCALC 123 (H) 08/24/2021   Lab Results  Component Value Date   TSH 0.908 07/10/2022   TSH 1.040 08/24/2021    Therapeutic Level Labs: No results found for: "LITHIUM" No results found for:  "VALPROATE" No results found for: "CBMZ"  Current Medications: Current Outpatient Medications  Medication Sig Dispense Refill   acetaminophen (TYLENOL) 500 MG tablet Take 2 tablets by mouth as needed.     ALPRAZolam (XANAX) 0.5 MG tablet Take 0.5 mg by mouth 2 (two) times daily as needed for anxiety or sleep.     apixaban (ELIQUIS) 5 MG TABS tablet Take 1 tablet (5 mg total) by mouth 2 (two) times daily. 60 tablet 1   Bioflavonoid Products (VITAMIN C) CHEW Chew by mouth daily. With vitamin D and elderberry. 2 chews daily     busPIRone (BUSPAR) 5 MG tablet Take 1 tablet (5 mg total) by mouth 2 (two) times daily. 180 tablet 0   Calcium Carbonate (CALCIUM 500 PO) Take 500 mg by mouth 2 (two) times daily.     clobetasol ointment (TEMOVATE) 0.05 % Apply topically to affected area daily until improved.     digoxin (LANOXIN) 0.25 MG tablet Take 1 tablet (0.25 mg total) by mouth daily. 30 tablet 1   docusate sodium (COLACE) 50 MG capsule Take 100 mg by mouth at bedtime.     Flaxseed, Linseed, (FLAXSEED OIL PO) Take 1,500 mg by mouth 2 (two) times daily.     glucose blood (ACCU-CHEK SMARTVIEW) test strip 1 each by Other route in the morning and at bedtime. Use as instructed 200 strip 3   JANUMET 50-1000 MG tablet TAKE ONE TABLET BY MOUTH TWICE DAILY (Patient taking differently: Take 1 tablet by mouth 2 (two) times daily with a meal.) 180 tablet 0   JARDIANCE 10 MG TABS tablet TAKE 1 TABLET BY MOUTH EVERY DAY BEFORE BREAKFAST (Patient taking differently: Take 10 mg by mouth daily before breakfast.) 30 tablet 1   LINZESS 145 MCG CAPS capsule TAKE 1 CAPSULE BY MOUTH EVERY DAY 30 capsule 2   losartan (COZAAR) 25 MG tablet Take 1 tablet (25 mg total) by mouth daily. 90 tablet 3   meclizine (ANTIVERT) 12.5 MG tablet Take 1 tablet (12.5 mg total) by mouth 3 (three) times daily as needed for dizziness. 30 tablet 0   metoprolol tartrate (LOPRESSOR) 50 MG tablet Take 1.5 tablets (75 mg total) by mouth 2 (two)  times daily. 90 tablet 0   Multiple Vitamins-Minerals (MULTIVITAMIN ADULT PO) Take 1 tablet by mouth daily.     pantoprazole (PROTONIX) 40 MG tablet TAKE 1 TABLET BY MOUTH TWICE A DAY 180 tablet 1   venlafaxine XR (EFFEXOR-XR) 150 MG 24 hr capsule Take 1 capsule (150 mg total) by mouth daily with breakfast.  90 capsule 0   venlafaxine XR (EFFEXOR-XR) 37.5 MG 24 hr capsule Take 1 capsule (37.5 mg total) by mouth daily with breakfast. Take a total of 187.5 mg daily. Take along with 150 mg cap 30 capsule 1   No current facility-administered medications for this visit.     Musculoskeletal: Strength & Muscle Tone:  N/A Gait & Station:  N/A Patient leans: N/A  Psychiatric Specialty Exam: Review of Systems  There were no vitals taken for this visit.There is no height or weight on file to calculate BMI.  General Appearance: {Appearance:22683}  Eye Contact:  {BHH EYE CONTACT:22684}  Speech:  Clear and Coherent  Volume:  Normal  Mood:  {BHH MOOD:22306}  Affect:  {Affect (PAA):22687}  Thought Process:  Coherent  Orientation:  Full (Time, Place, and Person)  Thought Content: Logical   Suicidal Thoughts:  {ST/HT (PAA):22692}  Homicidal Thoughts:  {ST/HT (PAA):22692}  Memory:  Immediate;   Good  Judgement:  {Judgement (PAA):22694}  Insight:  {Insight (PAA):22695}  Psychomotor Activity:  Normal  Concentration:  Concentration: Good and Attention Span: Good  Recall:  Good  Fund of Knowledge: Good  Language: Good  Akathisia:  No  Handed:  Right  AIMS (if indicated): not done  Assets:  Communication Skills Desire for Improvement  ADL's:  Intact  Cognition: WNL  Sleep:  {BHH GOOD/FAIR/POOR:22877}   Screenings: GAD-7    Flowsheet Row Video Visit from 07/18/2022 in [facility] Video Visit from 01/01/2022 in [facility] at [facility] Clinical Support from 08/24/2021 in [facility] at [facility] Video Visit from 07/19/2021 in [facility] at [facility] Video Visit from 05/03/2021 in [facility] at [facility]  Total GAD-7 Score 13 13 8 15 7       Mini-Mental    Flowsheet Row Clinical Support from 02/12/2020 in [facility] Clinical Support from 02/06/2018 in [facility], [facility]  Total Score (max 30 points ) 29 30      PHQ2-9    Flowsheet Row Video Visit from 07/18/2022 in [facility] at [facility] Counselor from 03/20/2022 in [facility] Video Visit from 01/01/2022 in [facility] at [facility] Counselor from 12/26/2021 in [facility] Clinical Support from 08/24/2021 in [facility] at [facility]  PHQ-2 Total Score 1 2 2 6  0  PHQ-9 Total Score 14 -- 16 23 13       Flowsheet Row Counselor from 07/20/2022 in [facility] [place] Most recent reading at 07/24/2022  8:33 AM Admission (Discharged) from 07/24/2022 in [facility] Most recent reading at 07/24/2022  7:47 AM ED to Hosp-Admission (Discharged) from 07/10/2022 in [facility] Most recent reading at 07/11/2022  7:00 AM  C-SSRS RISK CATEGORY Low Risk No Risk No Risk        Assessment and Plan:  Kazia Gutierrez is a 72 y.o. year old female with a history of  depression, hypertension, diabetes, GERD, who presents for follow up appointment for below.    1. MDD (major depressive disorder), recurrent episode, mild (HCC) 2. Anxiety Acute stressors include: pending cataract surgery, fall  Other stressors include: marital conflict, conflict with her children, financial strain  History:   She reports worsening in the irritability over the past few months in the context  of stressors as above.  Will uptitrate venlafaxine to optimize treatment for depression, anxiety. Discussed risk of hypertension. Will continue BuSpar for  anxiety.  Noted that she is on Xanax, prescribed by her PCP.  The hope is to taper off this medication in the future.  She will greatly benefit from CBT; she will continue to see Ms. Hussami for therapy.    Plan Increase venlafaxine 187.5 mg daily- monitor weight gain Continue BuSpar 5 mg twice a day- monitor drowsiness Continue Xanax 0.5 mg daily as needed for anxiety Next appointment : 4/19 at 10:30, video.  She agrees to have it in person visit after she has cataract surgery    Past trials of medication: sertraline, citalopram, lexapro, fluoxetine, bupropion (headache)   The patient demonstrates the following risk factors for suicide: Chronic risk factors for suicide include: psychiatric disorder of depression and chronic pain. Acute risk factors for suicide include: family or marital conflict. Protective factors for this patient include: hope for the future. Considering these factors, the overall suicide risk at this point appears to be low. Patient is appropriate for outpatient follow up.   Collaboration of Care: Collaboration of Care: {BH OP Collaboration of Care:21014065}  Patient/Guardian was advised Release of Information must be obtained prior to any record release in order to collaborate their care with an outside provider. Patient/Guardian was advised if they have not already done so to contact the registration department to sign all necessary forms in order for [place] to release information regarding their care.   Consent: Patient/Guardian gives verbal consent for treatment and assignment of benefits for services provided during this visit. Patient/Guardian expressed understanding and agreed to proceed.    [name], MD 08/01/2022, 8:01 AM

## 2022-08-02 ENCOUNTER — Other Ambulatory Visit: Payer: Self-pay | Admitting: Nurse Practitioner

## 2022-08-02 ENCOUNTER — Telehealth: Payer: Self-pay

## 2022-08-02 DIAGNOSIS — B379 Candidiasis, unspecified: Secondary | ICD-10-CM

## 2022-08-02 MED ORDER — FLUCONAZOLE 150 MG PO TABS
ORAL_TABLET | ORAL | 2 refills | Status: DC
Start: 2022-08-02 — End: 2022-11-08

## 2022-08-02 NOTE — Telephone Encounter (Signed)
Patient called stating that she has an yeast infection and asking for medication

## 2022-08-02 NOTE — Anesthesia Preprocedure Evaluation (Addendum)
Anesthesia Evaluation  Patient identified by MRN, date of birth, ID band Patient awake    Reviewed: Allergy & Precautions, H&P , NPO status , Patient's Chart, lab work & pertinent test results  Airway Mallampati: IV  TM Distance: <3 FB Neck ROM: Full    Dental no notable dental hx.    Pulmonary former smoker   Pulmonary exam normal breath sounds clear to auscultation       Cardiovascular hypertension, Pt. on medications Normal cardiovascular exam+ dysrhythmias Atrial Fibrillation  Rhythm:Regular Rate:Normal  Echo 07-10-22 EF 50-55%, mild LVH, LA moderately dilated   Neuro/Psych  PSYCHIATRIC DISORDERS Anxiety Depression     Neuromuscular disease    GI/Hepatic Neg liver ROS,GERD  ,,IBS   Endo/Other  negative endocrine ROSdiabetes, Type 2    Renal/GU negative Renal ROS  negative genitourinary   Musculoskeletal negative musculoskeletal ROS (+)    Abdominal   Peds negative pediatric ROS (+)  Hematology negative hematology ROS (+)   Anesthesia Other Findings Hypertension  GERD (gastroesophageal reflux disease) Depression  Neuromuscular disorder Anxiety  IBS (irritable bowel syndrome) Diabetes mellitus without complication Depression Atrial fibrillation     Reproductive/Obstetrics negative OB ROS                             Anesthesia Physical Anesthesia Plan  ASA: 3  Anesthesia Plan: MAC   Post-op Pain Management:    Induction: Intravenous  PONV Risk Score and Plan:   Airway Management Planned: Natural Airway and Nasal Cannula  Additional Equipment:   Intra-op Plan:   Post-operative Plan:   Informed Consent: I have reviewed the patients History and Physical, chart, labs and discussed the procedure including the risks, benefits and alternatives for the proposed anesthesia with the patient or authorized representative who has indicated his/her understanding and acceptance.      Dental Advisory Given  Plan Discussed with: Anesthesiologist, CRNA and Surgeon  Anesthesia Plan Comments: (Patient consented for risks of anesthesia including but not limited to:  - adverse reactions to medications - damage to eyes, teeth, lips or other oral mucosa - nerve damage due to positioning  - sore throat or hoarseness - Damage to heart, brain, nerves, lungs, other parts of body or loss of life  Patient voiced understanding.)       Anesthesia Quick Evaluation

## 2022-08-02 NOTE — Telephone Encounter (Signed)
Called pt she is advised of her Rx that was sent to the pharmacy 

## 2022-08-02 NOTE — Telephone Encounter (Signed)
Please let her know that I have sent prescription for diflucan. This can be taken every other day for 5 doses. There are 2 refills on the prescription.  Thanks so much.   -HB

## 2022-08-02 NOTE — Telephone Encounter (Signed)
Patient called office to see if she could receive a call back today, patient did not specify or want to give details, she would just like to receive a call back, thanks.

## 2022-08-02 NOTE — Telephone Encounter (Signed)
Called pt she stated that she was having an yeast infection

## 2022-08-03 ENCOUNTER — Telehealth: Payer: Medicare HMO | Admitting: Psychiatry

## 2022-08-06 NOTE — Telephone Encounter (Signed)
We can start her on furosemide 20 mg daily. Repeat BMP in 1 week. With her A-fib she may be holding on to an increased amount of fluid. Decrease sodium intake, elevated the legs, and be careful of the amount of fluid intake.

## 2022-08-06 NOTE — Discharge Instructions (Signed)

## 2022-08-07 ENCOUNTER — Ambulatory Visit: Payer: Medicare HMO | Admitting: Anesthesiology

## 2022-08-07 ENCOUNTER — Ambulatory Visit
Admission: RE | Admit: 2022-08-07 | Discharge: 2022-08-07 | Disposition: A | Discharge status: Home / Self Care | Payer: Medicare HMO | Attending: Ophthalmology | Admitting: Ophthalmology

## 2022-08-07 ENCOUNTER — Ambulatory Visit: Payer: Medicare HMO | Admitting: Cardiology

## 2022-08-07 ENCOUNTER — Encounter
Admission: RE | Disposition: A | Discharge status: Home / Self Care | Payer: Self-pay | Source: Home / Self Care | Attending: Ophthalmology

## 2022-08-07 ENCOUNTER — Other Ambulatory Visit: Payer: Self-pay | Admitting: *Deleted

## 2022-08-07 ENCOUNTER — Other Ambulatory Visit: Payer: Self-pay

## 2022-08-07 DIAGNOSIS — I10 Essential (primary) hypertension: Secondary | ICD-10-CM | POA: Diagnosis not present

## 2022-08-07 DIAGNOSIS — K219 Gastro-esophageal reflux disease without esophagitis: Secondary | ICD-10-CM | POA: Insufficient documentation

## 2022-08-07 DIAGNOSIS — I4891 Unspecified atrial fibrillation: Secondary | ICD-10-CM | POA: Insufficient documentation

## 2022-08-07 DIAGNOSIS — Z79899 Other long term (current) drug therapy: Secondary | ICD-10-CM

## 2022-08-07 DIAGNOSIS — Z87891 Personal history of nicotine dependence: Secondary | ICD-10-CM | POA: Insufficient documentation

## 2022-08-07 DIAGNOSIS — H2512 Age-related nuclear cataract, left eye: Secondary | ICD-10-CM | POA: Diagnosis not present

## 2022-08-07 DIAGNOSIS — K589 Irritable bowel syndrome without diarrhea: Secondary | ICD-10-CM | POA: Insufficient documentation

## 2022-08-07 DIAGNOSIS — Z7984 Long term (current) use of oral hypoglycemic drugs: Secondary | ICD-10-CM | POA: Insufficient documentation

## 2022-08-07 DIAGNOSIS — F32A Depression, unspecified: Secondary | ICD-10-CM | POA: Diagnosis not present

## 2022-08-07 DIAGNOSIS — E1136 Type 2 diabetes mellitus with diabetic cataract: Secondary | ICD-10-CM | POA: Diagnosis not present

## 2022-08-07 DIAGNOSIS — R600 Localized edema: Secondary | ICD-10-CM

## 2022-08-07 DIAGNOSIS — F419 Anxiety disorder, unspecified: Secondary | ICD-10-CM | POA: Insufficient documentation

## 2022-08-07 DIAGNOSIS — E1165 Type 2 diabetes mellitus with hyperglycemia: Secondary | ICD-10-CM | POA: Diagnosis not present

## 2022-08-07 HISTORY — PX: CATARACT EXTRACTION W/PHACO: SHX586

## 2022-08-07 LAB — GLUCOSE, CAPILLARY: Glucose-Capillary: 170 mg/dL — ABNORMAL HIGH (ref 70–99)

## 2022-08-07 SURGERY — PHACOEMULSIFICATION, CATARACT, WITH IOL INSERTION
Anesthesia: Monitor Anesthesia Care | Site: Eye | Laterality: Left

## 2022-08-07 MED ORDER — MIDAZOLAM HCL 2 MG/2ML IJ SOLN
INTRAMUSCULAR | Status: DC | PRN
  Administered 2022-08-07: 2 mg via INTRAVENOUS

## 2022-08-07 MED ORDER — BRIMONIDINE TARTRATE-TIMOLOL 0.2-0.5 % OP SOLN
OPHTHALMIC | Status: DC | PRN
  Administered 2022-08-07: 1 [drp] via OPHTHALMIC

## 2022-08-07 MED ORDER — FENTANYL CITRATE (PF) 100 MCG/2ML IJ SOLN
INTRAMUSCULAR | Status: DC | PRN
  Administered 2022-08-07: 50 ug via INTRAVENOUS

## 2022-08-07 MED ORDER — SIGHTPATH DOSE#1 NA CHONDROIT SULF-NA HYALURON 40-17 MG/ML IO SOLN
INTRAOCULAR | Status: DC | PRN
  Administered 2022-08-07: 1 mL via INTRAOCULAR

## 2022-08-07 MED ORDER — FUROSEMIDE 20 MG PO TABS: 20.0000 mg | ORAL_TABLET | Freq: Every day | ORAL | 1 refills | Status: DC

## 2022-08-07 MED ORDER — SIGHTPATH DOSE#1 BSS IO SOLN
INTRAOCULAR | Status: DC | PRN
  Administered 2022-08-07: 15 mL

## 2022-08-07 MED ORDER — MOXIFLOXACIN HCL 0.5 % OP SOLN
OPHTHALMIC | Status: DC | PRN
  Administered 2022-08-07: .2 mL via OPHTHALMIC

## 2022-08-07 MED ORDER — LACTATED RINGERS IV SOLN: INTRAVENOUS | Status: DC

## 2022-08-07 MED ORDER — ARMC OPHTHALMIC DILATING DROPS
1.0000 | OPHTHALMIC | Status: DC | PRN
  Administered 2022-08-07 (×3): 1 via OPHTHALMIC

## 2022-08-07 MED ORDER — SIGHTPATH DOSE#1 BSS IO SOLN
INTRAOCULAR | Status: DC | PRN
  Administered 2022-08-07: 56 mL via OPHTHALMIC

## 2022-08-07 MED ORDER — TETRACAINE HCL 0.5 % OP SOLN
1.0000 [drp] | OPHTHALMIC | Status: DC | PRN
  Administered 2022-08-07 (×3): 1 [drp] via OPHTHALMIC

## 2022-08-07 MED ORDER — SIGHTPATH DOSE#1 BSS IO SOLN
INTRAOCULAR | Status: DC | PRN
  Administered 2022-08-07: 1 mL via INTRAMUSCULAR

## 2022-08-07 SURGICAL SUPPLY — 8 items
CATARACT SUITE SIGHTPATH (MISCELLANEOUS) ×1 IMPLANT
FEE CATARACT SUITE SIGHTPATH (MISCELLANEOUS) ×1 IMPLANT
GLOVE BIOGEL PI IND STRL 8 (GLOVE) ×1 IMPLANT
GLOVE SURG ENC TEXT LTX SZ8 (GLOVE) ×1 IMPLANT
LENS IOL TECNIS EYHANCE 20.5 (Intraocular Lens) IMPLANT
NDL FILTER BLUNT 18X1 1/2 (NEEDLE) ×1 IMPLANT
NEEDLE FILTER BLUNT 18X1 1/2 (NEEDLE) ×1 IMPLANT
SYR 3ML LL SCALE MARK (SYRINGE) ×1 IMPLANT

## 2022-08-07 NOTE — H&P (Signed)
[facility]   Primary Care Physician:  Carlean Jews, NP Ophthalmologist: Dr. Druscilla Brownie  Pre-Procedure History & Physical: HPI:  Lisa Gutierrez is a 72 y.o. female here for cataract surgery.   Past Medical History:  Diagnosis Date   Anxiety    Atrial fibrillation    Depression    Depression    Phreesia 07/02/2020   Diabetes mellitus without complication    GERD (gastroesophageal reflux disease)    Hypertension    IBS (irritable bowel syndrome)    Neuromuscular disorder     Past Surgical History:  Procedure Laterality Date   BREAST EXCISIONAL BIOPSY     CATARACT EXTRACTION W/PHACO Right 07/24/2022   Procedure: CATARACT EXTRACTION PHACO AND INTRAOCULAR LENS PLACEMENT (IOC) RIGHT DIABETIC  5.93  00:42.5;  Surgeon: Galen Manila, MD;  Location: [facility];  Service: Ophthalmology;  Laterality: Right;   CESAREAN SECTION N/A    Phreesia 07/02/2020   CHOLECYSTECTOMY     EXCISION / BIOPSY BREAST / NIPPLE / DUCT Right 1973   duct removed   SPINE SURGERY N/A    Phreesia 07/02/2020   TUBAL LIGATION N/A    Phreesia 07/02/2020    Prior to Admission medications   Medication Sig Start Date End Date Taking? Authorizing Provider  acetaminophen (TYLENOL) 500 MG tablet Take 2 tablets by mouth as needed.   Yes [provider]  ALPRAZolam Prudy Feeler) 0.5 MG tablet Take 0.5 mg by mouth 2 (two) times daily as needed for anxiety or sleep. 03/28/22  Yes [provider]  apixaban (ELIQUIS) 5 MG TABS tablet Take 1 tablet (5 mg total) by mouth 2 (two) times daily. 07/12/22  Yes Wouk, Wilfred Curtis, MD  Bioflavonoid Products (VITAMIN C) CHEW Chew by mouth daily. With vitamin D and elderberry. 2 chews daily   Yes [provider]  busPIRone (BUSPAR) 5 MG tablet Take 1 tablet (5 mg total) by mouth 2 (two) times daily. 06/23/22 09/21/22 Yes Hisada, Barbee Cough, MD  Calcium Carbonate (CALCIUM 500 PO) Take 500 mg by mouth 2 (two) times daily.   Yes [provider]  clobetasol ointment (TEMOVATE) 0.05 % Apply topically to affected area daily until improved. 06/06/22  Yes [provider]  digoxin (LANOXIN) 0.25 MG tablet Take 1 tablet (0.25 mg total) by mouth daily. 07/13/22  Yes Wouk, Wilfred Curtis, MD  docusate sodium (COLACE) 50 MG capsule Take 100 mg by mouth at bedtime.   Yes [provider]  Flaxseed, Linseed, (FLAXSEED OIL PO) Take 1,500 mg by mouth 2 (two) times daily.   Yes [provider]  fluconazole (DIFLUCAN) 150 MG tablet Take 1 tablet po every other day for 5 doses. 08/02/22  Yes Boscia, Heather E, NP  JANUMET 50-1000 MG tablet TAKE ONE TABLET BY MOUTH TWICE DAILY Patient taking differently: Take 1 tablet by mouth 2 (two) times daily with a meal. 03/29/22  Yes Boscia, Heather E, NP  JARDIANCE 10 MG TABS tablet TAKE 1 TABLET BY MOUTH EVERY DAY BEFORE BREAKFAST Patient taking differently: Take 10 mg by mouth daily before breakfast. 06/26/21  Yes Boscia, Kathlynn Grate, NP  LINZESS 145 MCG CAPS capsule TAKE 1 CAPSULE BY MOUTH EVERY DAY 11/09/21  Yes Boscia, Heather E, NP  losartan (COZAAR) 25 MG tablet Take 1 tablet (25 mg total) by mouth daily. 07/18/22 10/16/22 Yes Hammock, Lavonna Rua, NP  meclizine (ANTIVERT) 12.5 MG tablet Take 1 tablet (12.5 mg total) by mouth 3 (three) times daily as needed for dizziness. 06/26/22  Yes Immordino, Jeannett Senior, FNP  metoprolol tartrate (LOPRESSOR) 50 MG tablet Take 1.5 tablets (75 mg total) by mouth 2 (two) times daily. 07/13/22 10/11/22 Yes Iran Ouch, MD  Multiple Vitamins-Minerals (MULTIVITAMIN ADULT PO) Take 1 tablet by mouth daily.   Yes [provider]  pantoprazole (PROTONIX) 40 MG tablet TAKE 1 TABLET BY MOUTH TWICE A DAY 07/18/22  Yes Carlean Jews, NP  venlafaxine XR (EFFEXOR-XR) 150 MG 24 hr capsule Take 1 capsule (150 mg total) by mouth daily with breakfast. 05/26/22 08/24/22 Yes Hisada, Barbee Cough, MD  venlafaxine XR (EFFEXOR-XR) 37.5 MG 24 hr capsule Take 1 capsule (37.5  mg total) by mouth daily with breakfast. Take a total of 187.5 mg daily. Take along with 150 mg cap 06/15/22 08/14/22 Yes Hisada, Reina, MD  glucose blood (ACCU-CHEK SMARTVIEW) test strip 1 each by Other route in the morning and at bedtime. Use as instructed 05/01/22   Carlean Jews, NP    Allergies as of 06/13/2022 - Review Complete 05/01/2022  Allergen Reaction Noted   Aspirin Other (See Comments) 12/28/2014   Ibuprofen Other (See Comments) 12/28/2014   Levofloxacin Other (See Comments) 12/28/2014   Meloxicam Other (See Comments) 12/28/2014   Morphine Other (See Comments) 12/28/2014   Naprosyn  [naproxen] Other (See Comments) 12/28/2014   Nsaids Other (See Comments) 12/28/2014   Quinolones  12/28/2014   Robinul  [glycopyrrolate] Nausea And Vomiting 12/28/2014   Penicillin v potassium Rash 12/28/2014   Sulfa antibiotics Rash 12/28/2014    Family History  Problem Relation Age of Onset   Diabetes Mother    Hypertension Mother    Coronary artery disease Father    Glaucoma Father    Breast cancer Neg Hx     Social History   Socioeconomic History   Marital status: Married    Spouse name: Not on file   Number of children: Not on file   Years of education: Not on file   Highest education level: Not on file  Occupational History   Not on file  Tobacco Use   Smoking status: Former   Smokeless tobacco: Never  Substance and Sexual Activity   Alcohol use: No    Alcohol/week: 0.0 standard drinks of alcohol   Drug use: Never   Sexual activity: Not Currently    Partners: Male  Other Topics Concern   Not on file  Social History Narrative   Not on file   Social Determinants of Health   Financial Resource Strain: Not on file  Food Insecurity: No Food Insecurity (07/11/2022)   Hunger Vital Sign    Worried About Running Out of Food in the Last Year: Never true    Ran Out of Food in the Last Year: Never true  Transportation Needs: No Transportation Needs (07/11/2022)   PRAPARE -  Administrator, Civil Service (Medical): No    Lack of Transportation (Non-Medical): No  Physical Activity: Not on file  Stress: Not on file  Social Connections: Not on file  Intimate Partner Violence: Not At Risk (07/11/2022)   Humiliation, Afraid, Rape, and Kick questionnaire    Fear of Current or Ex-Partner: No    Emotionally Abused: No    Physically Abused: No    Sexually Abused: No    Review of Systems: See HPI, otherwise negative ROS  Physical Exam: BP (!) 145/93   Pulse 87   Temp 97.6 F (36.4 C) (Temporal)   Resp (!) 22   Ht  (1.651 m)  Wt 113.9 kg   SpO2 93%   BMI 41.77 kg/m  General:   Alert, cooperative in NAD Head:  Normocephalic and atraumatic. Respiratory:  Normal work of breathing. Cardiovascular:  RRR  Impression/Plan: Lisa Gutierrez is here for cataract surgery.  Risks, benefits, limitations, and alternatives regarding cataract surgery have been reviewed with the patient.  Questions have been answered.  All parties agreeable.   Galen Manila, MD  08/07/2022, 7:48 AM

## 2022-08-07 NOTE — Transfer of Care (Signed)
Immediate Anesthesia Transfer of Care Note  Patient: Lisa Gutierrez  Procedure(s) Performed: CATARACT EXTRACTION PHACO AND INTRAOCULAR LENS PLACEMENT (IOC) LEFT DIABETIC  12.33  00:58.3 (Left: Eye)  Patient Location: PACU  Anesthesia Type: MAC  Level of Consciousness: awake, alert  and patient cooperative  Airway and Oxygen Therapy: Patient Spontanous Breathing and Patient connected to supplemental oxygen  Post-op Assessment: Post-op Vital signs reviewed, Patient's Cardiovascular Status Stable, Respiratory Function Stable, Patent Airway and No signs of Nausea or vomiting  Post-op Vital Signs: Reviewed and stable  Complications: No notable events documented.

## 2022-08-07 NOTE — Anesthesia Postprocedure Evaluation (Signed)
Anesthesia Post Note  Patient: Lisa Gutierrez  Procedure(s) Performed: CATARACT EXTRACTION PHACO AND INTRAOCULAR LENS PLACEMENT (IOC) LEFT DIABETIC  12.33  00:58.3 (Left: Eye)  Patient location during evaluation: PACU Anesthesia Type: MAC Level of consciousness: awake and alert Pain management: pain level controlled Vital Signs Assessment: post-procedure vital signs reviewed and stable Respiratory status: spontaneous breathing, nonlabored ventilation, respiratory function stable and patient connected to nasal cannula oxygen Cardiovascular status: stable and blood pressure returned to baseline Postop Assessment: no apparent nausea or vomiting Anesthetic complications: no   No notable events documented.   Last Vitals:  Vitals:   08/07/22 0815 08/07/22 0821  BP: 115/77 115/78  Pulse: 75 84  Resp: 18 18  Temp: (!) 36.2 C (!) 36.2 C  SpO2: 93% 93%    Last Pain:  Vitals:   08/07/22 0821  TempSrc:   PainSc: 0-No pain                 Marisue Humble

## 2022-08-07 NOTE — Op Note (Signed)
PREOPERATIVE DIAGNOSIS:  Nuclear sclerotic cataract of the left eye.   POSTOPERATIVE DIAGNOSIS:  Nuclear sclerotic cataract of the left eye.   OPERATIVE PROCEDURE:ORPROCALL@   SURGEON:  Galen Manila, MD.   ANESTHESIA:  Anesthesiologist: Marisue Humble, MD CRNA: Andee Poles, CRNA  1.      Managed anesthesia care. 2.     0.59ml of Shugarcaine was instilled following the paracentesis   COMPLICATIONS:  None.   TECHNIQUE:   Stop and chop   DESCRIPTION OF PROCEDURE:  The patient was examined and consented in the preoperative holding area where the aforementioned topical anesthesia was applied to the left eye and then brought back to the Operating Room where the left eye was prepped and draped in the usual sterile ophthalmic fashion and a lid speculum was placed. A paracentesis was created with the side port blade and the anterior chamber was filled with viscoelastic. A near clear corneal incision was performed with the steel keratome. A continuous curvilinear capsulorrhexis was performed with a cystotome followed by the capsulorrhexis forceps. Hydrodissection and hydrodelineation were carried out with BSS on a blunt cannula. The lens was removed in a stop and chop  technique and the remaining cortical material was removed with the irrigation-aspiration handpiece. The capsular bag was inflated with viscoelastic and the Technis [id number] lens was placed in the capsular bag without complication. The remaining viscoelastic was removed from the eye with the irrigation-aspiration handpiece. The wounds were hydrated. The anterior chamber was flushed with BSS and the eye was inflated to physiologic pressure. 0.39ml Vigamox was placed in the anterior chamber. The wounds were found to be water tight. The eye was dressed with Combigan. The patient was given protective glasses to wear throughout the day and a shield with which to sleep tonight. The patient was also given drops with which to begin a drop regimen  today and will follow-up with me in one day. Implant Name Type Inv. Item Serial No. Manufacturer Lot No. LRB No. Used Action  LENS IOL TECNIS EYHANCE 20.5 - [id number] Intraocular Lens LENS IOL TECNIS EYHANCE 20.5 [id number] [employer]  Left 1 Implanted    Procedure(s): CATARACT EXTRACTION PHACO AND INTRAOCULAR LENS PLACEMENT (IOC) LEFT DIABETIC  12.33  00:58.3 (Left)  Electronically signed: Galen Manila 08/07/2022 8:15 AM

## 2022-08-08 ENCOUNTER — Encounter: Payer: Self-pay | Admitting: Ophthalmology

## 2022-08-09 ENCOUNTER — Ambulatory Visit (INDEPENDENT_AMBULATORY_CARE_PROVIDER_SITE_OTHER): Payer: Medicare HMO

## 2022-08-09 ENCOUNTER — Other Ambulatory Visit: Payer: Self-pay | Admitting: Cardiovascular Disease

## 2022-08-09 ENCOUNTER — Ambulatory Visit: Payer: Medicare HMO | Admitting: Nurse Practitioner

## 2022-08-09 VITALS — Ht 66.0 in | Wt 241.0 lb

## 2022-08-09 DIAGNOSIS — Z0000 Encounter for general adult medical examination without abnormal findings: Secondary | ICD-10-CM

## 2022-08-09 NOTE — Patient Instructions (Addendum)
Ms. Roehr , Thank you for taking time to come for your Medicare Wellness Visit. I appreciate your ongoing commitment to your health goals. Please review the following plan we discussed and let me know if I can assist you in the future.   These are the goals we discussed:  Goals       No Current goals (pt-stated)        This is a list of the screening recommended for you and due dates:  Health Maintenance  Topic Date Due   DTaP/Tdap/Td vaccine (1 - Tdap) Never done   Yearly kidney health urinalysis for diabetes  08/25/2022   COVID-19 Vaccine (4 - 2023-24 season) 08/25/2022*   Screening for Lung Cancer  08/09/2023*   Complete foot exam   08/25/2022   Flu Shot  11/15/2022   Hemoglobin A1C  01/10/2023   Yearly kidney function blood test for diabetes  07/11/2023   Medicare Annual Wellness Visit  08/09/2023   Mammogram  09/29/2023   Colon Cancer Screening  02/12/2024   Pneumonia Vaccine  Completed   DEXA scan (bone density measurement)  Completed   Hepatitis C Screening: USPSTF Recommendation to screen - Ages 51-79 yo.  Completed   Zoster (Shingles) Vaccine  Completed   HPV Vaccine  Aged Out  *Topic was postponed. The date shown is not the original due date.    Advanced directives: Please bring a copy of your health care power of attorney and living will to the office to be added to your chart at your convenience.   Conditions/risks identified: None  Next appointment: Follow up in one year for your annual wellness visit    Preventive Care 65 Years and Older, Female Preventive care refers to lifestyle choices and visits with your health care provider that can promote health and wellness. What does preventive care include? A yearly physical exam. This is also called an annual well check. Dental exams once or twice a year. Routine eye exams. Ask your health care provider how often you should have your eyes checked. Personal lifestyle choices, including: Daily care of your teeth  and gums. Regular physical activity. Eating a healthy diet. Avoiding tobacco and drug use. Limiting alcohol use. Practicing safe sex. Taking low-dose aspirin every day. Taking vitamin and mineral supplements as recommended by your health care provider. What happens during an annual well check? The services and screenings done by your health care provider during your annual well check will depend on your age, overall health, lifestyle risk factors, and family history of disease. Counseling  Your health care provider may ask you questions about your: Alcohol use. Tobacco use. Drug use. Emotional well-being. Home and relationship well-being. Sexual activity. Eating habits. History of falls. Memory and ability to understand (cognition). Work and work Astronomer. Reproductive health. Screening  You may have the following tests or measurements: Height, weight, and BMI. Blood pressure. Lipid and cholesterol levels. These may be checked every 5 years, or more frequently if you are over 82 years old. Skin check. Lung cancer screening. You may have this screening every year starting at age 51 if you have a 30-pack-year history of smoking and currently smoke or have quit within the past 15 years. Fecal occult blood test (FOBT) of the stool. You may have this test every year starting at age 7. Flexible sigmoidoscopy or colonoscopy. You may have a sigmoidoscopy every 5 years or a colonoscopy every 10 years starting at age 29. Hepatitis C blood test. Hepatitis B blood test.  Sexually transmitted disease (STD) testing. Diabetes screening. This is done by checking your blood sugar (glucose) after you have not eaten for a while (fasting). You may have this done every 1-3 years. Bone density scan. This is done to screen for osteoporosis. You may have this done starting at age 49. Mammogram. This may be done every 1-2 years. Talk to your health care provider about how often you should have regular  mammograms. Talk with your health care provider about your test results, treatment options, and if necessary, the need for more tests. Vaccines  Your health care provider may recommend certain vaccines, such as: Influenza vaccine. This is recommended every year. Tetanus, diphtheria, and acellular pertussis (Tdap, Td) vaccine. You may need a Td booster every 10 years. Zoster vaccine. You may need this after age 59. Pneumococcal 13-valent conjugate (PCV13) vaccine. One dose is recommended after age 50. Pneumococcal polysaccharide (PPSV23) vaccine. One dose is recommended after age 75. Talk to your health care provider about which screenings and vaccines you need and how often you need them. This information is not intended to replace advice given to you by your health care provider. Make sure you discuss any questions you have with your health care provider. Document Released: 04/29/2015 Document Revised: 12/21/2015 Document Reviewed: 02/01/2015 Elsevier Interactive Patient Education  2017 [place] Prevention in the Home Falls can cause injuries. They can happen to people of all ages. There are many things you can do to make your home safe and to help prevent falls. What can I do on the outside of my home? Regularly fix the edges of walkways and driveways and fix any cracks. Remove anything that might make you trip as you walk through a door, such as a raised step or threshold. Trim any bushes or trees on the path to your home. Use bright outdoor lighting. Clear any walking paths of anything that might make someone trip, such as rocks or tools. Regularly check to see if handrails are loose or broken. Make sure that both sides of any steps have handrails. Any raised decks and porches should have guardrails on the edges. Have any leaves, snow, or ice cleared regularly. Use sand or salt on walking paths during winter. Clean up any spills in your garage right away. This includes oil  or grease spills. What can I do in the bathroom? Use night lights. Install grab bars by the toilet and in the tub and shower. Do not use towel bars as grab bars. Use non-skid mats or decals in the tub or shower. If you need to sit down in the shower, use a plastic, non-slip stool. Keep the floor dry. Clean up any water that spills on the floor as soon as it happens. Remove soap buildup in the tub or shower regularly. Attach bath mats securely with double-sided non-slip rug tape. Do not have throw rugs and other things on the floor that can make you trip. What can I do in the bedroom? Use night lights. Make sure that you have a light by your bed that is easy to reach. Do not use any sheets or blankets that are too big for your bed. They should not hang down onto the floor. Have a firm chair that has side arms. You can use this for support while you get dressed. Do not have throw rugs and other things on the floor that can make you trip. What can I do in the kitchen? Clean up any spills right away.  Avoid walking on wet floors. Keep items that you use a lot in easy-to-reach places. If you need to reach something above you, use a strong step stool that has a grab bar. Keep electrical cords out of the way. Do not use floor polish or wax that makes floors slippery. If you must use wax, use non-skid floor wax. Do not have throw rugs and other things on the floor that can make you trip. What can I do with my stairs? Do not leave any items on the stairs. Make sure that there are handrails on both sides of the stairs and use them. Fix handrails that are broken or loose. Make sure that handrails are as long as the stairways. Check any carpeting to make sure that it is firmly attached to the stairs. Fix any carpet that is loose or worn. Avoid having throw rugs at the top or bottom of the stairs. If you do have throw rugs, attach them to the floor with carpet tape. Make sure that you have a light  switch at the top of the stairs and the bottom of the stairs. If you do not have them, ask someone to add them for you. What else can I do to help prevent falls? Wear shoes that: Do not have high heels. Have rubber bottoms. Are comfortable and fit you well. Are closed at the toe. Do not wear sandals. If you use a stepladder: Make sure that it is fully opened. Do not climb a closed stepladder. Make sure that both sides of the stepladder are locked into place. Ask someone to hold it for you, if possible. Clearly mark and make sure that you can see: Any grab bars or handrails. First and last steps. Where the edge of each step is. Use tools that help you move around (mobility aids) if they are needed. These include: Canes. Walkers. Scooters. Crutches. Turn on the lights when you go into a dark area. Replace any light bulbs as soon as they burn out. Set up your furniture so you have a clear path. Avoid moving your furniture around. If any of your floors are uneven, fix them. If there are any pets around you, be aware of where they are. Review your medicines with your doctor. Some medicines can make you feel dizzy. This can increase your chance of falling. Ask your doctor what other things that you can do to help prevent falls. This information is not intended to replace advice given to you by your health care provider. Make sure you discuss any questions you have with your health care provider. Document Released: 01/27/2009 Document Revised: 09/08/2015 Document Reviewed: 05/07/2014 Elsevier Interactive Patient Education  2017 [employer].

## 2022-08-09 NOTE — Progress Notes (Signed)
Subjective:   Lisa Gutierrez is a 72 y.o. female who presents for Medicare Annual (Subsequent) preventive examination.  Review of Systems    Virtual Visit via Telephone Note  I connected with  Lisa Gutierrez on 08/09/22 at  1:30 PM EDT by telephone and verified that I am speaking with the correct person using two identifiers.  Location: Patient: Home Provider: Office Persons participating in the virtual visit: patient/Nurse Health Advisor   I discussed the limitations, risks, security and privacy concerns of performing an evaluation and management service by telephone and the availability of in person appointments. The patient expressed understanding and agreed to proceed.  Interactive audio and video telecommunications were attempted between this nurse and patient, however failed, due to patient having technical difficulties OR patient did not have access to video capability.  We continued and completed visit with audio only.  Some vital signs may be absent or patient reported.   Lisa Rung, LPN  Cardiac Risk Factors include: advanced age (>7men, >38 women)     Objective:    Today's Vitals   08/09/22 1336 08/09/22 1342  Weight: 241 lb (109.3 kg)   Height: 5\' 6"  (1.676 m)   PainSc:  0-No pain   Body mass index is 38.9 kg/m.     08/09/2022    1:54 PM 08/07/2022    7:20 AM 07/24/2022    7:47 AM 07/11/2022    7:09 AM 01/18/2021   11:28 AM 01/03/2018    2:09 PM 12/07/2016    6:02 PM  Advanced Directives  Does Patient Have a Medical Advance Directive? Yes Yes No No No No No  Type of Estate agent of [place];Living will Healthcare Power of [place];Living will       Does patient want to make changes to medical advance directive?  No - Patient declined       Copy of Healthcare Power of Attorney in Chart? No - copy requested No - copy requested       Would patient like information on creating a medical advance directive?    No - Patient  declined Yes (MAU/Ambulatory/Procedural Areas - Information given) Yes (MAU/Ambulatory/Procedural Areas - Information given) No - Patient declined    Current Medications (verified) Outpatient Encounter Medications as of 08/09/2022  Medication Sig   acetaminophen (TYLENOL) 500 MG tablet Take 2 tablets by mouth as needed.   ALPRAZolam (XANAX) 0.5 MG tablet Take 0.5 mg by mouth 2 (two) times daily as needed for anxiety or sleep.   apixaban (ELIQUIS) 5 MG TABS tablet Take 1 tablet (5 mg total) by mouth 2 (two) times daily.   Bioflavonoid Products (VITAMIN C) CHEW Chew by mouth daily. With vitamin D and elderberry. 2 chews daily   busPIRone (BUSPAR) 5 MG tablet Take 1 tablet (5 mg total) by mouth 2 (two) times daily.   Calcium Carbonate (CALCIUM 500 PO) Take 500 mg by mouth 2 (two) times daily.   clobetasol ointment (TEMOVATE) 0.05 % Apply topically to affected area daily until improved.   digoxin (LANOXIN) 0.25 MG tablet Take 1 tablet (0.25 mg total) by mouth daily.   docusate sodium (COLACE) 50 MG capsule Take 100 mg by mouth at bedtime.   Flaxseed, Linseed, (FLAXSEED OIL PO) Take 1,500 mg by mouth 2 (two) times daily.   fluconazole (DIFLUCAN) 150 MG tablet Take 1 tablet po every other day for 5 doses.   furosemide (LASIX) 20 MG tablet Take 1 tablet (20 mg total) by mouth  daily.   glucose blood (ACCU-CHEK SMARTVIEW) test strip 1 each by Other route in the morning and at bedtime. Use as instructed   JANUMET 50-1000 MG tablet TAKE ONE TABLET BY MOUTH TWICE DAILY (Patient taking differently: Take 1 tablet by mouth 2 (two) times daily with a meal.)   JARDIANCE 10 MG TABS tablet TAKE 1 TABLET BY MOUTH EVERY DAY BEFORE BREAKFAST (Patient taking differently: Take 10 mg by mouth daily before breakfast.)   LINZESS 145 MCG CAPS capsule TAKE 1 CAPSULE BY MOUTH EVERY DAY   losartan (COZAAR) 25 MG tablet Take 1 tablet (25 mg total) by mouth daily.   meclizine (ANTIVERT) 12.5 MG tablet Take 1 tablet (12.5 mg  total) by mouth 3 (three) times daily as needed for dizziness.   metoprolol tartrate (LOPRESSOR) 50 MG tablet TAKE 1.5 TABLETS BY MOUTH 2 TIMES DAILY.   Multiple Vitamins-Minerals (MULTIVITAMIN ADULT PO) Take 1 tablet by mouth daily.   pantoprazole (PROTONIX) 40 MG tablet TAKE 1 TABLET BY MOUTH TWICE A DAY   venlafaxine XR (EFFEXOR-XR) 150 MG 24 hr capsule Take 1 capsule (150 mg total) by mouth daily with breakfast.   venlafaxine XR (EFFEXOR-XR) 37.5 MG 24 hr capsule Take 1 capsule (37.5 mg total) by mouth daily with breakfast. Take a total of 187.5 mg daily. Take along with 150 mg cap   [DISCONTINUED] metoprolol tartrate (LOPRESSOR) 50 MG tablet Take 1.5 tablets (75 mg total) by mouth 2 (two) times daily.   No facility-administered encounter medications on file as of 08/09/2022.    Allergies (verified) Aspirin, Ibuprofen, Levofloxacin, Meloxicam, Morphine, Naprosyn  [naproxen], Nsaids, Propofol, Quinolones, Robinul  [glycopyrrolate], Penicillin v potassium, and Sulfa antibiotics   History: Past Medical History:  Diagnosis Date   Anxiety    Atrial fibrillation    Depression    Depression    Phreesia 07/02/2020   Diabetes mellitus without complication    GERD (gastroesophageal reflux disease)    Hypertension    IBS (irritable bowel syndrome)    Neuromuscular disorder    Past Surgical History:  Procedure Laterality Date   BREAST EXCISIONAL BIOPSY     CATARACT EXTRACTION W/PHACO Right 07/24/2022   Procedure: CATARACT EXTRACTION PHACO AND INTRAOCULAR LENS PLACEMENT (IOC) RIGHT DIABETIC  5.93  00:42.5;  Surgeon: Galen Manila, MD;  Location: [facility];  Service: Ophthalmology;  Laterality: Right;   CATARACT EXTRACTION W/PHACO Left 08/07/2022   Procedure: CATARACT EXTRACTION PHACO AND INTRAOCULAR LENS PLACEMENT (IOC) LEFT DIABETIC  12.33  00:58.3;  Surgeon: Galen Manila, MD;  Location: [facility];  Service: Ophthalmology;  Laterality: Left;   CESAREAN SECTION N/A     Phreesia 07/02/2020   CHOLECYSTECTOMY     EXCISION / BIOPSY BREAST / NIPPLE / DUCT Right 1973   duct removed   SPINE SURGERY N/A    Phreesia 07/02/2020   TUBAL LIGATION N/A    Phreesia 07/02/2020   Family History  Problem Relation Age of Onset   Diabetes Mother    Hypertension Mother    Coronary artery disease Father    Glaucoma Father    Breast cancer Neg Hx    Social History   Socioeconomic History   Marital status: Married    Spouse name: Not on file   Number of children: Not on file   Years of education: Not on file   Highest education level: Not on file  Occupational History   Not on file  Tobacco Use   Smoking status: Former   Smokeless tobacco:  Never  Substance and Sexual Activity   Alcohol use: No    Alcohol/week: 0.0 standard drinks of alcohol   Drug use: Never   Sexual activity: Not Currently    Partners: Male  Other Topics Concern   Not on file  Social History Narrative   Not on file   Social Determinants of Health   Financial Resource Strain: Low Risk  (08/09/2022)   Overall Financial Resource Strain (CARDIA)    Difficulty of Paying Living Expenses: Not hard at all  Food Insecurity: No Food Insecurity (08/09/2022)   Hunger Vital Sign    Worried About Running Out of Food in the Last Year: Never true    Ran Out of Food in the Last Year: Never true  Transportation Needs: No Transportation Needs (08/09/2022)   PRAPARE - Administrator, Civil Service (Medical): No    Lack of Transportation (Non-Medical): No  Physical Activity: Inactive (08/09/2022)   Exercise Vital Sign    Days of Exercise per Week: 0 days    Minutes of Exercise per Session: 0 min  Stress: Stress Concern Present (08/09/2022)   [employer] of Occupational Health - Occupational Stress Questionnaire    Feeling of Stress : Rather much  Social Connections: Socially Integrated (08/09/2022)   Social Connection and Isolation Panel [NHANES]    Frequency of Communication with  Friends and Family: More than three times a week    Frequency of Social Gatherings with Friends and Family: More than three times a week    Attends Religious Services: More than 4 times per year    Active Member of [employer] or Organizations: Yes    Attends Engineer, structural: More than 4 times per year    Marital Status: Married    Tobacco Counseling Counseling given: Not Answered   Clinical Intake:  Pre-visit preparation completed: No  Pain : No/denies pain Pain Score: 0-No pain Nutrition Risk Assessment:  Has the patient had any N/V/D within the last 2 months?  No  Does the patient have any non-healing wounds?  No  Has the patient had any unintentional weight loss or weight gain?  No   Diabetes:  Is the patient diabetic?  Yes  If diabetic, was a CBG obtained today?  Yes CBG 120 Taken by patient Did the patient bring in their glucometer from home?  No  How often do you monitor your CBG's? Daily.   Financial Strains and Diabetes Management:  Are you having any financial strains with the device, your supplies or your medication? No .  Does the patient want to be seen by Chronic Care Management for management of their diabetes?  No  Would the patient like to be referred to a Nutritionist or for Diabetic Management?  No   Diabetic Exams:  Diabetic Eye Exam: Completed . Overdue for diabetic eye exam. Pt has been advised about the importance in completing this exam. A referral has been placed today. Message sent to referral coordinator for scheduling purposes. Advised pt to expect a call from office referred to regarding appt.  Diabetic Foot Exam: Completed . Pt has been advised about the importance in completing this exam. Pt is scheduled for diabetic foot exam on Followed by PCP.      BMI - recorded: 38.9 Nutritional Status: BMI > 30  Obese Nutritional Risks: None Diabetes: No  How often do you need to have someone help you when you read instructions, pamphlets,  or other written materials from your doctor  or pharmacy?: 1 - Never  Diabetic?  Yes  Interpreter Needed?: No  Information entered by :: Theresa Mulligan LPN   Activities of Daily Living    08/09/2022    1:49 PM 08/07/2022    7:23 AM  In your present state of health, do you have any difficulty performing the following activities:  Hearing? 0 0  Vision? 0 0  Difficulty concentrating or making decisions? 0 0  Walking or climbing stairs? 0 0  Dressing or bathing? 0 0  Doing errands, shopping? 0   Preparing Food and eating ? N   Using the Toilet? N   In the past six months, have you accidently leaked urine? Y   Comment Wears breifs. Followed by PCP   Do you have problems with loss of bowel control? N   Managing your Medications? N   Managing your Finances? N   Housekeeping or managing your Housekeeping? N     Patient Care Team: Carlean Jews, NP as PCP - General (Family Medicine) Iran Ouch, MD as PCP - Cardiology (Cardiology)  Indicate any recent Medical Services you may have received from other than [facility] providers in the past year (date may be approximate).     Assessment:   This is a routine wellness examination for Trixie.  Hearing/Vision screen Hearing Screening - Comments:: Denies hearing difficulties   Vision Screening - Comments:: Wears rx glasses - up to date with routine eye exams with  [facility]  Dietary issues and exercise activities discussed: Exercise limited by: None identified   Goals Addressed               This Visit's Progress     No Current goals (pt-stated)         Depression Screen    08/09/2022    1:47 PM 08/09/2022    1:45 PM 07/18/2022    8:51 AM 03/20/2022   10:41 AM 01/01/2022   12:59 PM 12/26/2021    4:49 PM 08/24/2021   10:41 AM  PHQ 2/9 Scores  PHQ - 2 Score 0 0 1  2  0  PHQ- 9 Score 2 0 14  16  13      Information is confidential and restricted. Go to Review Flowsheets to unlock data.    Fall Risk     08/09/2022    1:50 PM 01/01/2022    1:03 PM 08/24/2021   10:42 AM 05/03/2021    9:57 AM 02/23/2021    1:13 PM  Fall Risk   Falls in the past year? 1 0 1 1 0  Number falls in past yr: 0 0 1 1 1   Injury with Fall? 1 0 1 1 1   Comment Torn ligaments/Shoulder twisted rt wrist, followed by Medical attention      Risk for fall due to : Impaired balance/gait      Follow up Falls prevention discussed Falls evaluation completed Falls evaluation completed Falls evaluation completed Falls evaluation completed    FALL RISK PREVENTION PERTAINING TO THE HOME:  Any stairs in or around the home? Yes  If so, are there any without handrails? No  Home free of loose throw rugs in walkways, pet beds, electrical cords, etc? Yes  Adequate lighting in your home to reduce risk of falls? Yes   ASSISTIVE DEVICES UTILIZED TO PREVENT FALLS:  Life alert? No  Use of a cane, walker or w/c? No  Grab bars in the bathroom? No  Shower chair or bench in shower?  No  Elevated toilet seat or a handicapped toilet? No   TIMED UP AND GO:  Was the test performed? No . Audio Visit   Cognitive Function:    02/12/2020   12:10 PM 02/06/2018   10:11 AM  MMSE - Mini Mental State Exam  Orientation to time 5 5  Orientation to Place 5 5  Registration 3 3  Attention/ Calculation 5 5  Recall 3 3  Language- name 2 objects 2 2  Language- repeat 1 1  Language- follow 3 step command 3 3  Language- read & follow direction 1 1  Write a sentence 0 1  Copy design 1 1  Total score 29 30        08/09/2022    1:54 PM  6CIT Screen  What Year? 0 points  What month? 0 points  What time? 0 points  Count back from 20 0 points  Months in reverse 4 points  Repeat phrase 0 points  Total Score 4 points    Immunizations Immunization History  Administered Date(s) Administered   Fluad Quad(high Dose 65+) 12/19/2018, 02/11/2020   Influenza Inj Mdck Quad Pf 01/25/2018   Influenza, High Dose Seasonal PF 01/25/2018    Influenza,inj,quad, With Preservative 02/15/2022   PFIZER(Purple Top)SARS-COV-2 Vaccination 06/14/2019, 07/14/2019, 01/30/2020   Pneumococcal Conjugate-13 02/11/2017   Pneumococcal Polysaccharide-23 04/23/2018, 10/09/2018   Zoster Recombinat (Shingrix) 12/19/2018, 02/26/2019    TDAP status: Due, Education has been provided regarding the importance of this vaccine. Advised may receive this vaccine at local pharmacy or Health Dept. Aware to provide a copy of the vaccination record if obtained from local pharmacy or Health Dept. Verbalized acceptance and understanding.  Flu Vaccine status: Up to date  Pneumococcal vaccine status: Up to date  Covid-19 vaccine status: Completed vaccines  Qualifies for Shingles Vaccine? Yes   Zostavax completed Yes   Shingrix Completed?: Yes  Screening Tests Health Maintenance  Topic Date Due   DTaP/Tdap/Td (1 - Tdap) Never done   Diabetic kidney evaluation - Urine ACR  08/25/2022   COVID-19 Vaccine (4 - 2023-24 season) 08/25/2022 (Originally 12/15/2021)   Lung Cancer Screening  08/09/2023 (Originally 02/26/2001)   FOOT EXAM  08/25/2022   INFLUENZA VACCINE  11/15/2022   HEMOGLOBIN A1C  01/10/2023   Diabetic kidney evaluation - eGFR measurement  07/11/2023   Medicare Annual Wellness (AWV)  08/09/2023   MAMMOGRAM  09/29/2023   COLONOSCOPY (Pts 45-86yrs Insurance coverage will need to be confirmed)  02/12/2024   Pneumonia Vaccine 64+ Years old  Completed   DEXA SCAN  Completed   Hepatitis C Screening  Completed   Zoster Vaccines- Shingrix  Completed   HPV VACCINES  Aged Out    Health Maintenance  Health Maintenance Due  Topic Date Due   DTaP/Tdap/Td (1 - Tdap) Never done   Diabetic kidney evaluation - Urine ACR  08/25/2022    Colorectal cancer screening: Type of screening: Colonoscopy. Completed 02/11/14. Repeat every 10 years  Mammogram status: Completed 09/28/21. Repeat every year  Bone Density status: Completed 03/14/15. Results reflect:  Bone density results: OSTEOPOROSIS. Repeat every   years.  Lung Cancer Screening: (Low Dose CT Chest recommended if Age 53-80 years, 30 pack-year currently smoking OR have quit w/in 15years.) does not qualify.     Additional Screening:  Hepatitis C Screening: does qualify; Completed 02/12/20  Vision Screening: Recommended annual ophthalmology exams for early detection of glaucoma and other disorders of the eye. Is the patient up to date with their annual  eye exam?  Yes  Who is the provider or what is the name of the office in which the patient attends annual eye exams? [facility] If pt is not established with a provider, would they like to be referred to a provider to establish care? No .   Dental Screening: Recommended annual dental exams for proper oral hygiene  Community Resource Referral / Chronic Care Management:  CRR required this visit?  No   CCM required this visit?  No      Plan:     I have personally reviewed and noted the following in the patient's chart:   Medical and social history Use of alcohol, tobacco or illicit drugs  Current medications and supplements including opioid prescriptions. Patient is not currently taking opioid prescriptions. Functional ability and status Nutritional status Physical activity Advanced directives List of other physicians Hospitalizations, surgeries, and ER visits in previous 12 months Vitals Screenings to include cognitive, depression, and falls Referrals and appointments  In addition, I have reviewed and discussed with patient certain preventive protocols, quality metrics, and best practice recommendations. A written personalized care plan for preventive services as well as general preventive health recommendations were provided to patient.     Lisa Rung, LPN   1/61/0960   Nurse Notes: None

## 2022-08-13 ENCOUNTER — Telehealth: Payer: Self-pay | Admitting: Pharmacist

## 2022-08-13 ENCOUNTER — Other Ambulatory Visit: Payer: Medicare HMO | Admitting: Pharmacist

## 2022-08-13 NOTE — Progress Notes (Signed)
08/13/2022 Name: Lisa Gutierrez MRN: [id number] DOB: [birth year]   Lisa Gutierrez is a 72 y.o. year old female who presented for a telephone visit.   They were referred to the pharmacist by a quality report for assistance in managing  quality metrics: med adherence diabetes (MAD) .    Subjective:  Care Team: Primary Care Provider: Carlean Jews, NP ; Next Scheduled Visit: 09/04/22   Medication Access/Adherence  Current Pharmacy:  [phone] [place], [place] - [address] [place] [address] Phone: [phone] Fax: [phone]  [facility] - [place], [place] - [address], [address], [address] [place] [address] Phone: [phone] Fax: [phone]  [place], [place] - [address] [place] [address] Phone: [phone] Fax: [phone]   Patient reports affordability concerns with their medications: Yes  Patient reports access/transportation concerns to their pharmacy: No  Patient reports adherence concerns with their medications:  No     Diabetes:  Current medications: janumet 50-1000mg  BID, jardiance 10mg  daily   Current medication access support: janumet and jardiance from PAP   Objective:  Lab Results  Component Value Date   HGBA1C 7.5 (H) 07/10/2022    Lab Results  Component Value Date   CREATININE 0.65 07/11/2022   BUN 12 07/11/2022   NA 136 07/11/2022   K 3.7 07/11/2022   CL 98 07/11/2022   CO2 23 07/11/2022    Lab Results  Component Value Date   CHOL 195 08/24/2021   HDL 31 (L) 08/24/2021   LDLCALC 123 (H) 08/24/2021   TRIG 233 (H) 08/24/2021   CHOLHDL 6.3 (H) 08/24/2021    Medications Reviewed Today     Reviewed by [name], LPN (Licensed Practical Nurse) on 08/09/22 at 1335  Med List Status: <None>   Medication Order Taking? Sig Documenting Provider Last Dose Status Informant  acetaminophen  (TYLENOL) 500 MG tablet [id number] No Take 2 tablets by mouth as needed. [provider] Past Week Active Self, Pharmacy Records           Med Note Luster Landsberg, TIFFANI S   Tue Jul 10, 2022  5:44 PM)    ALPRAZolam Prudy Feeler) 0.5 MG tablet [id number] No Take 0.5 mg by mouth 2 (two) times daily as needed for anxiety or sleep. [provider] 08/07/2022 0530 Active Self, Pharmacy Records           Med Note Luster Landsberg, TIFFANI S   Tue Jul 10, 2022  5:44 PM)    apixaban (ELIQUIS) 5 MG TABS tablet [id number] No Take 1 tablet (5 mg total) by mouth 2 (two) times daily. Kathrynn Running, MD 08/06/2022 Active   Bioflavonoid Products (VITAMIN C) CHEW [id number] No Chew by mouth daily. With vitamin D and elderberry. 2 chews daily [provider] 08/06/2022 Active Self, Pharmacy Records  busPIRone (BUSPAR) 5 MG tablet [id number] No Take 1 tablet (5 mg total) by mouth 2 (two) times daily. [name], MD 08/06/2022 Active Self, Pharmacy Records           Med Note Luster Landsberg, TIFFANI S   Tue Jul 10, 2022  5:44 PM)    Calcium Carbonate (CALCIUM 500 PO) [id number] No Take 500 mg by mouth 2 (two) times daily. [provider] 08/06/2022 Active Self, Pharmacy Records  clobetasol ointment (TEMOVATE) 0.05 % [id number] No Apply topically to affected area daily until improved. [provider] 08/06/2022 Active Self, Pharmacy  Records  digoxin (LANOXIN) 0.25 MG tablet [id number] No Take 1 tablet (0.25 mg total) by mouth daily. Kathrynn Running, MD 08/06/2022 Active   docusate sodium (COLACE) 50 MG capsule [id number] No Take 100 mg by mouth at bedtime. [provider] 08/06/2022 Active Self, Pharmacy Records  Flaxseed, Linseed, (FLAXSEED OIL PO) [id number] No Take 1,500 mg by mouth 2 (two) times daily. [provider] 08/06/2022 Active Self, Pharmacy Records  fluconazole (DIFLUCAN) 150 MG tablet [id number] No Take 1 tablet po every other day for 5 doses. Carlean Jews, NP 08/06/2022  Active   furosemide (LASIX) 20 MG tablet [id number]  Take 1 tablet (20 mg total) by mouth daily. Charlsie Quest, NP  Active   glucose blood (ACCU-CHEK SMARTVIEW) test strip [id number] No 1 each by Other route in the morning and at bedtime. Use as instructed Carlean Jews, NP Taking Active Self, Pharmacy Records  JANUMET 50-1000 MG tablet [id number] No TAKE ONE TABLET BY MOUTH TWICE DAILY  Patient taking differently: Take 1 tablet by mouth 2 (two) times daily with a meal.   Carlean Jews, NP 08/06/2022 Active Self, Pharmacy Records  JARDIANCE 10 MG TABS tablet [id number] No TAKE 1 TABLET BY MOUTH EVERY DAY BEFORE BREAKFAST  Patient taking differently: Take 10 mg by mouth daily before breakfast.   Carlean Jews, NP 08/06/2022 Active Self, Pharmacy Records  LINZESS 145 MCG CAPS capsule [id number] No TAKE 1 CAPSULE BY MOUTH EVERY DAY Carlean Jews, NP 08/06/2022 Active Self, Pharmacy Records  losartan (COZAAR) 25 MG tablet [id number] No Take 1 tablet (25 mg total) by mouth daily. Charlsie Quest, NP 08/06/2022 Active   meclizine (ANTIVERT) 12.5 MG tablet [id number] No Take 1 tablet (12.5 mg total) by mouth 3 (three) times daily as needed for dizziness. Immordino, Jeannett Senior, FNP Past Month Active Self, Pharmacy Records  metoprolol tartrate (LOPRESSOR) 50 MG tablet [id number]  TAKE 1.5 TABLETS BY MOUTH 2 TIMES DAILY. Iran Ouch, MD  Active   Multiple Vitamins-Minerals (MULTIVITAMIN ADULT PO) [id number] No Take 1 tablet by mouth daily. [provider] 08/06/2022 Active Self, Pharmacy Records  pantoprazole (PROTONIX) 40 MG tablet [id number] No TAKE 1 TABLET BY MOUTH TWICE A DAY Boscia, Heather E, NP 08/06/2022 Active   venlafaxine XR (EFFEXOR-XR) 150 MG 24 hr capsule [id number] No Take 1 capsule (150 mg total) by mouth daily with breakfast. [name], MD 08/06/2022 Active Self, Pharmacy Records  venlafaxine XR (EFFEXOR-XR) 37.5 MG 24 hr capsule [id number] No Take 1 capsule (37.5 mg total) by  mouth daily with breakfast. Take a total of 187.5 mg daily. Take along with 150 mg cap [name], MD 08/06/2022 Active Self, Pharmacy Records           Med Note Michaelle Copas, [facility]   Tue Jul 10, 2022  2:47 PM) Patient has not started the additional 37.5 mg yet.              Assessment/Plan:   Diabetes: - Currently uncontrolled - Recommend to continue current medications  - Meets financial criteria for jardiance and janumet  patient assistance program through manufacturers. Will collaborate with provider, CPhT, and patient to pursue assistance. Patient also is seeking application for eliquis - counseled patient on the 3% out of pocket spend requirement for BMS manufacturer, she verbalized understanding.  - Provided medication counseling, answered patient questions surrounding new medications for atrial fibrillation, as well as counseling for eliquis on signs/symptoms of rare/major bleeding and when to seek medical attention.  Thurston Hole  Clide Cliff, PharmD, BCPS Clinical Pharmacist [facility]

## 2022-08-13 NOTE — Progress Notes (Signed)
Patient appearing on report for quality metrics. Attempted to return call as patient had left voicemail on direct line in response to my earlier attempt.  Outreached patient to discuss medication management. Left voicemail for patient to return my call at their convenience.   Lynnda Shields, PharmD, BCPS Clinical Pharmacist [facility]

## 2022-08-13 NOTE — Patient Instructions (Signed)
[name],  Thank you for taking my call today. As discussed, keep up the good work with taking your medications, and keeping your appointments with primary care and specialist doctors.  Take care, [name], PharmD, BCPS Clinical Pharmacist [facility]

## 2022-08-13 NOTE — Progress Notes (Signed)
Patient appearing on report for quality metrics.  Outreached patient to discuss medication management. Left voicemail for patient to return my call at their convenience.   Valori Hollenkamp, PharmD, BCPS Clinical Pharmacist [facility]  

## 2022-08-14 ENCOUNTER — Telehealth: Payer: Self-pay | Admitting: Pharmacy Technician

## 2022-08-14 ENCOUNTER — Telehealth: Payer: Self-pay | Admitting: *Deleted

## 2022-08-14 ENCOUNTER — Ambulatory Visit: Payer: Medicare HMO | Admitting: Cardiology

## 2022-08-14 DIAGNOSIS — Z5986 Financial insecurity: Secondary | ICD-10-CM

## 2022-08-14 NOTE — Telephone Encounter (Signed)
Pt calling to ask about a form that was to be completed by provider for medication assistance, she said she thought it had been completed but the company said it expired in march.  She would like to know if we have this form and if so to resend it.  I told her that I did not see any scanned form in her chart and I did not see any notes about any paperwork.  Please advise if you have this form and are able to resend it?

## 2022-08-14 NOTE — Progress Notes (Addendum)
[facility] Customer service manager [facility])  [facility] Quality Pharmacy Team   08/14/2022  Rilya Longo 1950/09/06 [id number]  Reason for referral: Medication assistance with [employer] for Eliquis and [employer] for Janumet  Referral source:  [facility] PharmD Lynnda Shields Current insurance: Monia Pouch  Outreach:  Successful telephone call with Patient.  HIPAA identifiers verified. Informed patient that a referral was received from [facility] PharmD Lynnda Shields for [employer]MS patient assistance for Eliquis and Janumet patient assistance with [employer]. Informed patient that she meets income requirements for the assistance foundations. Informed her that would need updated income information for the household size of 2. Also informed her that would need the [facility] pharmacy reports for the household for this current year for the Eliquis application.  Inquired about Janumet application with [employer]. She informs she currently has a supply on hand around 30 tablets. Informed her that [employer] says her application expired on 06/20/22 and that she would need to reapply. She informs she did reapply. Informed her to find the determination letter but that in the meantime I would mail her out a Merck application as she meets income guidelines for the program.  She inquired about AccuChek Smartview test strips. Informed patient that [employer]'s preferred choice was One Touch and that she would potentially have to pay out of pocker for AccuChek Smartview strips if she did not want to switch to One Touch. She informs she will call [facility] and get the cost of the AccuChek SmartView strips. She was also advised to call her health insurance plan [employer] and express concerns about AccuChek Smartview not being preferred with their plan.  Medication Review Findings:  [employer] and per [employer], patient's application with [employer] for [employer] expired 06/20/22 and patient will need to Reapply Patient spoke with [facility] Lynnda Shields and expressed interest in applying for Eliquis thru [employer]  patient assistance foundation Patient currently enrolled with [employer] for Jardiance until 04/16/23 and with [employer] for Linzess until 04/16/23  Medication Assistance Findings:  Medication assistance needs identified: Eliquis with BMS and Janumet with [employer]ck  Extra Help:  Not eligible for Extra Help Low Income Subsidy based on reported income and assets    Plan: I will route patient assistance letter to [facility] pharmacy technician who will coordinate patient assistance program application process for medications listed above.  [facility] pharmacy technician will assist with obtaining all required documents from both patient and provider(s) and submit application(s) once completed.  Thank you for allowing [facility] pharmacy to be a part of this patient's care.   Jemia Fata P. Gareld Obrecht, CPhT [facility] [employer]  [phone]

## 2022-08-14 NOTE — Telephone Encounter (Signed)
I have not received a form for this medication for her. Can the company resend it to [place]?

## 2022-08-14 NOTE — Telephone Encounter (Signed)
Contacted Lisa Gutierrez with [facility] and she is going to have it sent to [place] through the courier attention Maximillian Habibi and we will route this to provider for signature. Routing to PCP as an [place]

## 2022-08-17 ENCOUNTER — Telehealth: Payer: Self-pay | Admitting: Pharmacy Technician

## 2022-08-17 DIAGNOSIS — Z5986 Financial insecurity: Secondary | ICD-10-CM

## 2022-08-17 NOTE — Progress Notes (Signed)
[facility] Customer service manager [facility])                                            [facility] Quality Pharmacy Team    08/17/2022  Lisa Gutierrez 1950/12/30 [id number]                                      Medication Assistance Referral  Referral From:  [facility] PharmD Lynnda Shields  Medication/Company: Eliquis / BMS Patient application portion:  Mailed Provider application portion: Faxed  to Dr. Lorine Bears Provider address/fax verified via: Office website  Medication/Company: Lisa Gutierrez / [employer] Patient application portion:  Mailed Provider application portion: Interoffice Mailed to Vincent Gros, NP (attn April) Provider address/fax verified via: Office website    Asani Mcburney P. Jerrod Damiano, CPhT [facility] [employer]  [phone]

## 2022-08-22 ENCOUNTER — Ambulatory Visit: Payer: Medicare HMO | Admitting: Cardiology

## 2022-08-22 NOTE — Progress Notes (Deleted)
  Cardiology Office Note:   Date:  08/22/2022  ID:  Lisa Gutierrez, DOB 1951/02/21, MRN [id number]  History of Present Illness:   Lisa Gutierrez is a 72 y.o. female with past medical history of essential hypertension, chronic allergic tinnitus, GERD, IBS with constipation, chronic superficial gastritis without bleeding, fatty liver disease (nonalcoholic), type 2 diabetes, generalized anxiety disorder, insomnia, vertigo, history of statin intolerance, morbid obesity, who was recently hospitalized for new onset atrial fibrillation with RVR who is here today to follow-up on her atrial fibrillation and discuss cardioversion.  She presented to the [facility] on 07/10/2022.  She been scheduled for cataract surgery and was evaluated by anesthesia prior to the procedure.  Anesthesia noted heart rates of 1 70-1 90 and question atrial fibrillation with RVR on EKG.  She was sent to the emergency department for further evaluation even though she was asymptomatic.  Vital signs are stable echocardiogram revealed an LVEF of 50-55%, no regional wall motion abnormalities, no valvular abnormalities were noted.  She was started on metoprolol, dig, and apixaban and was discharged on 07/12/2022.  She was last seen in clinic 07/16/2022 where she was following up on her recent diagnosis of atrial fibrillation.  She had tolerated her apixaban and beta-blockers without incident. ROS: ***  Studies Reviewed:    EKG:  ***  ***  Risk Assessment/Calculations:   {Does this patient have ATRIAL FIBRILLATION?[phone]} No BP recorded.  {Refresh Note OR Click here to enter BP  :1}***        Physical Exam:   VS:  There were no vitals taken for this visit.   Wt Readings from Last 3 Encounters:  08/09/22 241 lb (109.3 kg)  08/07/22 251 lb (113.9 kg)  07/24/22 239 lb (108.4 kg)     GEN: Well nourished, well developed in no acute distress NECK: No JVD; No carotid  bruits CARDIAC: ***RRR, no murmurs, rubs, gallops RESPIRATORY:  Clear to auscultation without rales, wheezing or rhonchi  ABDOMEN: Soft, non-tender, non-distended EXTREMITIES:  No edema; No deformity   ASSESSMENT AND PLAN:   ***    {Are you ordering a CV Procedure (e.g. stress test, cath, DCCV, TEE, etc)?   Press F2        :[id number]}   Signed, Danee Soller, NP

## 2022-08-24 ENCOUNTER — Other Ambulatory Visit: Payer: Self-pay | Admitting: Psychiatry

## 2022-08-27 ENCOUNTER — Other Ambulatory Visit: Payer: Self-pay | Admitting: Psychiatry

## 2022-08-28 ENCOUNTER — Telehealth: Payer: Self-pay | Admitting: *Deleted

## 2022-08-28 NOTE — Telephone Encounter (Addendum)
Pt calling requesting refill on below, she said that she is no longer seeing the provider who was prescribing and that they told her since she did not need them anymore her PCP should be able to refill. Pt called back and needed both doses of the venlafaxine     venlafaxine XR (EFFEXOR-XR) 150 MG 24 hr capsule   ALPRAZolam (XANAX) 0.5 MG tablet    venlafaxine XR (EFFEXOR-XR) 37.5 MG 24 hr capsule    [id number] [place] [place] - [address]    LOV 05/01/22 ROV 09/04/22

## 2022-08-29 ENCOUNTER — Other Ambulatory Visit: Payer: Self-pay | Admitting: Nurse Practitioner

## 2022-08-29 DIAGNOSIS — F3341 Major depressive disorder, recurrent, in partial remission: Secondary | ICD-10-CM

## 2022-08-29 DIAGNOSIS — F411 Generalized anxiety disorder: Secondary | ICD-10-CM

## 2022-08-29 MED ORDER — VENLAFAXINE HCL ER 150 MG PO CP24
150.0000 mg | ORAL_CAPSULE | Freq: Every day | ORAL | 0 refills | Status: DC
Start: 2022-08-29 — End: 2022-11-08

## 2022-08-29 MED ORDER — VENLAFAXINE HCL ER 37.5 MG PO CP24
37.5000 mg | ORAL_CAPSULE | Freq: Every day | ORAL | 0 refills | Status: DC
Start: 2022-08-29 — End: 2022-09-25

## 2022-08-29 MED ORDER — ALPRAZOLAM 0.5 MG PO TABS
0.5000 mg | ORAL_TABLET | Freq: Every evening | ORAL | 0 refills | Status: DC | PRN
Start: 2022-08-29 — End: 2022-10-01

## 2022-08-29 NOTE — Telephone Encounter (Signed)
I took care of this for her. She sees me later this month.

## 2022-08-30 ENCOUNTER — Telehealth: Payer: Self-pay | Admitting: Pharmacy Technician

## 2022-08-30 DIAGNOSIS — Z5986 Financial insecurity: Secondary | ICD-10-CM

## 2022-08-30 NOTE — Progress Notes (Signed)
[facility])                                            [facility] Quality Pharmacy Team    08/30/2022  Lisa Gutierrez [birth year] [id number]  Received both patient and provider portion(s) of patient assistance application(s) for Janumet. Mailed completed application and required documents into [employer].   Re faxed to Dr. Jari Sportsman office the provider portion of the BMS application for Eliquis.   Wave Calzada P. Zorawar Strollo, CPhT [facility] [employer]  [phone]

## 2022-08-31 ENCOUNTER — Other Ambulatory Visit
Admission: RE | Admit: 2022-08-31 | Discharge: 2022-08-31 | Disposition: A | Discharge status: Home / Self Care | Payer: Medicare HMO | Source: Ambulatory Visit | Attending: Cardiology | Admitting: Cardiology

## 2022-08-31 ENCOUNTER — Ambulatory Visit: Payer: Medicare HMO | Attending: Cardiology | Admitting: Cardiology

## 2022-08-31 ENCOUNTER — Encounter: Payer: Self-pay | Admitting: Cardiology

## 2022-08-31 VITALS — BP 122/80 | HR 97 | Ht 66.0 in | Wt 242.2 lb

## 2022-08-31 DIAGNOSIS — I10 Essential (primary) hypertension: Secondary | ICD-10-CM | POA: Diagnosis not present

## 2022-08-31 DIAGNOSIS — F411 Generalized anxiety disorder: Secondary | ICD-10-CM | POA: Diagnosis not present

## 2022-08-31 DIAGNOSIS — E1165 Type 2 diabetes mellitus with hyperglycemia: Secondary | ICD-10-CM | POA: Diagnosis not present

## 2022-08-31 DIAGNOSIS — Z7984 Long term (current) use of oral hypoglycemic drugs: Secondary | ICD-10-CM | POA: Diagnosis not present

## 2022-08-31 DIAGNOSIS — R600 Localized edema: Secondary | ICD-10-CM

## 2022-08-31 DIAGNOSIS — E669 Obesity, unspecified: Secondary | ICD-10-CM

## 2022-08-31 DIAGNOSIS — I4891 Unspecified atrial fibrillation: Secondary | ICD-10-CM

## 2022-08-31 LAB — BASIC METABOLIC PANEL
Anion gap: 13 (ref 5–15)
BUN: 11 mg/dL (ref 8–23)
CO2: 25 mmol/L (ref 22–32)
Calcium: 10.1 mg/dL (ref 8.9–10.3)
Chloride: 101 mmol/L (ref 98–111)
Creatinine, Ser: 0.73 mg/dL (ref 0.44–1.00)
GFR, Estimated: >60 mL/min (ref >60)
Glucose, Bld: 205 mg/dL — ABNORMAL HIGH (ref 70–99)
Potassium: 3.7 mmol/L (ref 3.5–5.1)
Sodium: 139 mmol/L (ref 135–145)

## 2022-08-31 LAB — CBC
HCT: 44.9 % (ref 36.0–46.0)
Hemoglobin: 14.7 g/dL (ref 12.0–15.0)
MCH: 30.1 pg (ref 26.0–34.0)
MCHC: 32.7 g/dL (ref 30.0–36.0)
MCV: 91.8 fL (ref 80.0–100.0)
Platelets: 271 10*3/uL (ref 150–400)
RBC: 4.89 MIL/uL (ref 3.87–5.11)
RDW: 13.4 % (ref 11.5–15.5)
WBC: 10.6 10*3/uL — ABNORMAL HIGH (ref 4.0–10.5)
nRBC: 0 % (ref 0.0–0.2)

## 2022-08-31 NOTE — Progress Notes (Signed)
Cardiology Office Note:   Date:  08/31/2022  ID:  [name], DOB 03-Jul-1950, MRN [id number]  History of Present Illness:   Lisa Gutierrez is a 72 y.o. female with past medical history of essential hypertension, chronic allergic tinnitus, GERD, IBS with constipation, chronic superficial gastritis without bleeding, fatty liver disease (nonalcoholic), type 2 diabetes, generalized anxiety disorder, insomnia, vertigo, history of statin intolerance, morbid obesity, who was recently hospitalized for new onset atrial fibrillation with RVR who is here today to follow-up on her atrial fibrillation and discuss cardioversion.   She presented to the [facility] emergency [facility] on 07/10/2022.  She been scheduled for cataract surgery and was evaluated by anesthesia prior to the procedure.  Anesthesia noted heart rates of 1 70-1 90 and question atrial fibrillation with RVR on EKG.  She was sent to the emergency department for further evaluation even though she was asymptomatic.  Vital signs are stable echocardiogram revealed an LVEF of 50-55%, no regional wall motion abnormalities, no valvular abnormalities were noted.  She was started on metoprolol, dig, and apixaban and was discharged on 07/12/2022.   She was last seen in clinic 07/16/2022 where she was following up on her recent diagnosis of atrial fibrillation.  She had tolerated her apixaban and beta-blockers without incident.She is called the clinic or sent messages several times about swelling to her bilateral lower extremities and had to be placed on furosemide.She was also ordered follow up blood work after being started on diuretic therapy, which she has yet to have completed.  She returns to clinic today slightly upset because she arrived to her appointment later today.  She is nervous and scared about being in atrial fibrillation.  She continues to be extremely symptomatic with fatigue, shortness of breath, and swelling  to her bilateral lower extremities.  She has been compliant with all of her medications and states she has not missed any doses of her anticoagulant of apixaban since being started on medication.  She denies any recent hospitalizations or visits to the emergency department.  She did several MyChart messages about the worsening swelling she noted to bilateral lower extremities and was placed on furosemide.  She is due to follow-up blood work completed today.  She is also here to discuss completing a cardioversion procedure.  ROS: Review of systems has been completed and is considered negative with exception of what is listed in the HPI  Studies Reviewed:    EKG: Rate controlled atrial fibrillation  TTE 07/10/22 1. Left ventricular ejection fraction, by estimation, is 50 to 55%. The  left ventricle has low normal function. The left ventricle has no regional  wall motion abnormalities. There is mild left ventricular hypertrophy.  Left ventricular diastolic  parameters are indeterminate.   2. Right ventricular systolic function is normal. The right ventricular  size is normal. Tricuspid regurgitation signal is inadequate for assessing  PA pressure.   3. Left atrial size was moderately dilated.   4. The mitral valve is normal in structure. No evidence of mitral valve  regurgitation. No evidence of mitral stenosis.   5. The aortic valve is normal in structure. Aortic valve regurgitation is  not visualized. No aortic stenosis is present.    Risk Assessment/Calculations:    CHA2DS2-VASc Score = 4   This indicates a 4.8% annual risk of stroke. The patient's score is based upon: CHF History: 0 HTN History: 1 Diabetes History: 1 Stroke History: 0 Vascular Disease History: 0 Age Score: 1  Gender Score: 1             Physical Exam:   VS:  BP 122/80 (BP Location: Left Arm, Patient Position: Sitting, Cuff Size: Large)   Pulse 97   Ht 5\' 6"  (1.676 m)   Wt 242 lb 4 oz (109.9 kg)   SpO2 95%    BMI 39.10 kg/m    Wt Readings from Last 3 Encounters:  08/31/22 242 lb 4 oz (109.9 kg)  08/09/22 241 lb (109.3 kg)  08/07/22 251 lb (113.9 kg)     GEN: Well nourished, well developed in no acute distress NECK: No JVD; No carotid bruits CARDIAC: IR IR, no murmurs, rubs, gallops RESPIRATORY:  Clear to auscultation without rales, wheezing or rhonchi  ABDOMEN: Soft, non-tender, non-distended EXTREMITIES:  1+ edema to BLE; No deformity   ASSESSMENT AND PLAN:   Atrial fibrillation that is rate controlled today with unknown duration.  She is symptomatic with shortness of breath, fatigue, peripheral edema.  Ventricular rates today at rest range 90-100.  She is continued on apixaban 5 mg twice daily for CHA2DS2-VASc score of at least 4.  Denies any increased bruising, or noting blood in her stool or urine.  She is also continued on digoxin 0.25 mg daily metoprolol tartrate 75 mg twice daily.  We have discussed the risk and benefits of the cardioversion procedure and she is scheduled next week with Dr. Kirke Corin.  Preprocedure blood work will be scheduled today.  Essential hypertension with blood pressure today 122/80.  Blood pressure is better controlled since starting losartan 25 mg daily.  She has been encouraged to continue to monitor her blood pressures 1 to 2 hours after she takes her medication at home.  She is also been advised to decrease her sodium intake.  Peripheral edema to the bilateral lower extremities.  She had previously been started on furosemide 20 mg daily.  She has been advised to increase in swelling and discomfort that she can take her Lasix on an as needed basis versus daily.  Type 2 diabetes she is continued on Jardiance and Janumet.  This continues to be managed by her PCP.  Anxiety and emotional distress she continues to be tearful today.  She states that she has continued to see psychiatry and her counselor.  Obesity with BMI 39.  Recommended with weight management and  encouraged to make dietary changes and increase activity.  Disposition patient return to clinic to see MD/APP in 3 to 4 weeks postprocedure with EKG on return.    Shared Decision Making/Informed Consent The risks (stroke, cardiac arrhythmias rarely resulting in the need for a temporary or permanent pacemaker, skin irritation or burns and complications associated with conscious sedation including aspiration, arrhythmia, respiratory failure and death), benefits (restoration of normal sinus rhythm) and alternatives of a direct current cardioversion were explained in detail to Lisa Gutierrez and she agrees to proceed.     Signed, Shaday Rayborn, NP

## 2022-08-31 NOTE — H&P (View-Only) (Signed)
 Cardiology Office Note:   Date:  08/31/2022  ID:  Lisa Gutierrez, DOB 02/02/1951, MRN [id number]  History of Present Illness:   Lisa Gutierrez is a 71 y.o. female with past medical history of essential hypertension, chronic allergic tinnitus, GERD, IBS with constipation, chronic superficial gastritis without bleeding, fatty liver disease (nonalcoholic), type 2 diabetes, generalized anxiety disorder, insomnia, vertigo, history of statin intolerance, morbid obesity, who was recently hospitalized for new onset atrial fibrillation with RVR who is here today to follow-up on her atrial fibrillation and discuss cardioversion.   She presented to the [facility] emergency department [facility] on 07/10/2022.  She been scheduled for cataract surgery and was evaluated by anesthesia prior to the procedure.  Anesthesia noted heart rates of 1 70-1 90 and question atrial fibrillation with RVR on EKG.  She was sent to the emergency department for further evaluation even though she was asymptomatic.  Vital signs are stable echocardiogram revealed an LVEF of 50-55%, no regional wall motion abnormalities, no valvular abnormalities were noted.  She was started on metoprolol, dig, and apixaban and was discharged on 07/12/2022.   She was last seen in clinic 07/16/2022 where she was following up on her recent diagnosis of atrial fibrillation.  She had tolerated her apixaban and beta-blockers without incident.She is called the clinic or sent messages several times about swelling to her bilateral lower extremities and had to be placed on furosemide.She was also ordered follow up blood work after being started on diuretic therapy, which she has yet to have completed.  She returns to clinic today slightly upset because she arrived to her appointment later today.  She is nervous and scared about being in atrial fibrillation.  She continues to be extremely symptomatic with fatigue, shortness of breath, and swelling  to her bilateral lower extremities.  She has been compliant with all of her medications and states she has not missed any doses of her anticoagulant of apixaban since being started on medication.  She denies any recent hospitalizations or visits to the emergency department.  She did several MyChart messages about the worsening swelling she noted to bilateral lower extremities and was placed on furosemide.  She is due to follow-up blood work completed today.  She is also here to discuss completing a cardioversion procedure.  ROS: Review of systems has been completed and is considered negative with exception of what is listed in the HPI  Studies Reviewed:    EKG: Rate controlled atrial fibrillation  TTE 07/10/22 1. Left ventricular ejection fraction, by estimation, is 50 to 55%. The  left ventricle has low normal function. The left ventricle has no regional  wall motion abnormalities. There is mild left ventricular hypertrophy.  Left ventricular diastolic  parameters are indeterminate.   2. Right ventricular systolic function is normal. The right ventricular  size is normal. Tricuspid regurgitation signal is inadequate for assessing  PA pressure.   3. Left atrial size was moderately dilated.   4. The mitral valve is normal in structure. No evidence of mitral valve  regurgitation. No evidence of mitral stenosis.   5. The aortic valve is normal in structure. Aortic valve regurgitation is  not visualized. No aortic stenosis is present.    Risk Assessment/Calculations:    CHA2DS2-VASc Score = 4   This indicates a 4.8% annual risk of stroke. The patient's score is based upon: CHF History: 0 HTN History: 1 Diabetes History: 1 Stroke History: 0 Vascular Disease History: 0 Age Score: 1   Gender Score: 1             Physical Exam:   VS:  BP 122/80 (BP Location: Left Arm, Patient Position: Sitting, Cuff Size: Large)   Pulse 97   Ht 5' 6" (1.676 m)   Wt 242 lb 4 oz (109.9 kg)   SpO2 95%    BMI 39.10 kg/m    Wt Readings from Last 3 Encounters:  08/31/22 242 lb 4 oz (109.9 kg)  08/09/22 241 lb (109.3 kg)  08/07/22 251 lb (113.9 kg)     GEN: Well nourished, well developed in no acute distress NECK: No JVD; No carotid bruits CARDIAC: IR IR, no murmurs, rubs, gallops RESPIRATORY:  Clear to auscultation without rales, wheezing or rhonchi  ABDOMEN: Soft, non-tender, non-distended EXTREMITIES:  1+ edema to BLE; No deformity   ASSESSMENT AND PLAN:   Atrial fibrillation that is rate controlled today with unknown duration.  She is symptomatic with shortness of breath, fatigue, peripheral edema.  Ventricular rates today at rest range 90-100.  She is continued on apixaban 5 mg twice daily for CHA2DS2-VASc score of at least 4.  Denies any increased bruising, or noting blood in her stool or urine.  She is also continued on digoxin 0.25 mg daily metoprolol tartrate 75 mg twice daily.  We have discussed the risk and benefits of the cardioversion procedure and she is scheduled next week with Dr. Arida.  Preprocedure blood work will be scheduled today.  Essential hypertension with blood pressure today 122/80.  Blood pressure is better controlled since starting losartan 25 mg daily.  She has been encouraged to continue to monitor her blood pressures 1 to 2 hours after she takes her medication at home.  She is also been advised to decrease her sodium intake.  Peripheral edema to the bilateral lower extremities.  She had previously been started on furosemide 20 mg daily.  She has been advised to increase in swelling and discomfort that she can take her Lasix on an as needed basis versus daily.  Type 2 diabetes she is continued on Jardiance and Janumet.  This continues to be managed by her PCP.  Anxiety and emotional distress she continues to be tearful today.  She states that she has continued to see psychiatry and her counselor.  Obesity with BMI 39.  Recommended with weight management and  encouraged to make dietary changes and increase activity.  Disposition patient return to clinic to see MD/APP in 3 to 4 weeks postprocedure with EKG on return.    Shared Decision Making/Informed Consent The risks (stroke, cardiac arrhythmias rarely resulting in the need for a temporary or permanent pacemaker, skin irritation or burns and complications associated with conscious sedation including aspiration, arrhythmia, respiratory failure and death), benefits (restoration of normal sinus rhythm) and alternatives of a direct current cardioversion were explained in detail to Ms. Thurlow and she agrees to proceed.     Signed, Malone Admire, NP   

## 2022-08-31 NOTE — Patient Instructions (Addendum)
Medication Instructions:  No changes at this time.   *If you need a refill on your cardiac medications before your next appointment, please call your pharmacy*   Lab Work: CBC & BMP to be done over at the [facility] and check in at registration.   If you have labs (blood work) drawn today and your tests are completely normal, you will receive your results only by: MyChart Message (if you have MyChart) OR A paper copy in the mail If you have any lab test that is abnormal or we need to change your treatment, we will call you to review the results.   Testing/Procedures: You are scheduled for a Cardioversion on 09/03/22 with Dr. Kirke Corin.    Please arrive at the [facility] Center Entrance of [facility], [address], [place] [address] at 07:00 am (This is 1 hour prior to your procedure time).  Proceed to the Check-In Desk directly inside the entrance.  Procedure Parking: Use the entrance off of the [address] side of the hospital. Turn right upon entering and follow the driveway to parking that is directly in front of the Heart & Vascular Center. There is no valet parking available at this entrance, however there is an awning directly in front of the Heart & Vascular Center for drop off/ pick up for patients  DIET: Nothing to eat or drink after midnight except a sip of water with medications (see medication instructions below)  Medication Instructions: Hold Furosemide  Continue your anticoagulant: Eliquis If you miss a dose, please call us at [phone] You will need to continue your anticoagulant after your procedure until you are told by your provider that it is safe to stop.   Labs: BMET & CBC today over at [facility]  FYI: For your safety, and to allow [place] to monitor your vital signs accurately during the surgery/procedure we request that if you have artificial nails, gel coating, SNS etc. Please have those removed prior to your surgery/procedure. Not having  the nail coverings /polish removed may result in cancellation or delay of your surgery/procedure.  You must have a responsible person to drive you home and stay in the waiting area during your procedure. Failure to do so could result in cancellation.  Bring your insurance cards.  If you have any questions after you get home, please call the office at [phone]  *Special Note: Every effort is made to have your procedure done on time. Occasionally there are emergencies that occur at the hospital that may cause delays. Please be patient if a delay does occur.       Follow-Up: At [facility], you and your health needs are our priority.  As part of our continuing mission to provide you with exceptional heart care, we have created designated Provider Care Teams.  These Care Teams include your primary Cardiologist (physician) and Advanced Practice Providers (APPs -  Physician Assistants and Nurse Practitioners) who all work together to provide you with the care you need, when you need it.   Your next appointment:   2 week(s) after cardioversion  Provider:   Lorine Bears, MD or Charlsie Quest, NP

## 2022-09-03 ENCOUNTER — Ambulatory Visit
Admission: RE | Admit: 2022-09-03 | Discharge: 2022-09-03 | Disposition: A | Discharge status: Home / Self Care | Payer: Medicare HMO | Attending: Cardiovascular Disease | Admitting: Cardiovascular Disease

## 2022-09-03 ENCOUNTER — Ambulatory Visit: Payer: Medicare HMO | Admitting: Certified Registered Nurse Anesthetist

## 2022-09-03 ENCOUNTER — Encounter
Admission: RE | Disposition: A | Discharge status: Home / Self Care | Payer: Self-pay | Source: Home / Self Care | Attending: Cardiovascular Disease

## 2022-09-03 DIAGNOSIS — Z79899 Other long term (current) drug therapy: Secondary | ICD-10-CM | POA: Insufficient documentation

## 2022-09-03 DIAGNOSIS — E119 Type 2 diabetes mellitus without complications: Secondary | ICD-10-CM | POA: Insufficient documentation

## 2022-09-03 DIAGNOSIS — F419 Anxiety disorder, unspecified: Secondary | ICD-10-CM | POA: Diagnosis not present

## 2022-09-03 DIAGNOSIS — Z9861 Coronary angioplasty status: Secondary | ICD-10-CM | POA: Diagnosis not present

## 2022-09-03 DIAGNOSIS — E669 Obesity, unspecified: Secondary | ICD-10-CM | POA: Insufficient documentation

## 2022-09-03 DIAGNOSIS — I4891 Unspecified atrial fibrillation: Secondary | ICD-10-CM | POA: Diagnosis not present

## 2022-09-03 DIAGNOSIS — I10 Essential (primary) hypertension: Secondary | ICD-10-CM | POA: Diagnosis not present

## 2022-09-03 DIAGNOSIS — Z6837 Body mass index (BMI) 37.0-37.9, adult: Secondary | ICD-10-CM | POA: Diagnosis not present

## 2022-09-03 DIAGNOSIS — Z7901 Long term (current) use of anticoagulants: Secondary | ICD-10-CM | POA: Insufficient documentation

## 2022-09-03 DIAGNOSIS — Z7984 Long term (current) use of oral hypoglycemic drugs: Secondary | ICD-10-CM | POA: Diagnosis not present

## 2022-09-03 DIAGNOSIS — Z87891 Personal history of nicotine dependence: Secondary | ICD-10-CM | POA: Diagnosis not present

## 2022-09-03 HISTORY — PX: CARDIOVERSION: SHX1299

## 2022-09-03 LAB — GLUCOSE, CAPILLARY: Glucose-Capillary: 178 mg/dL — ABNORMAL HIGH (ref 70–99)

## 2022-09-03 SURGERY — CARDIOVERSION
Anesthesia: General

## 2022-09-03 MED ORDER — SODIUM CHLORIDE 0.9 % IV SOLN: INTRAVENOUS | Status: DC

## 2022-09-03 MED ORDER — PROPOFOL 10 MG/ML IV BOLUS
INTRAVENOUS | Status: DC | PRN
  Administered 2022-09-03: 40 mg via INTRAVENOUS
  Administered 2022-09-03: 20 mg via INTRAVENOUS

## 2022-09-03 NOTE — Progress Notes (Signed)
Pre procedure labs

## 2022-09-03 NOTE — Transfer of Care (Signed)
Immediate Anesthesia Transfer of Care Note  Patient: Lisa Gutierrez  Procedure(s) Performed: CARDIOVERSION  Patient Location: cardiac same day   Anesthesia Type:General  Level of Consciousness: awake, alert , and oriented  Airway & Oxygen Therapy: Patient Spontanous Breathing and Patient connected to nasal cannula oxygen  Post-op Assessment: Report given to RN and Post -op Vital signs reviewed and stable  Post vital signs: Reviewed and stable  Last Vitals:  Vitals Value Taken Time  BP 111/72 09/03/22 0809  Temp    Pulse 71 09/03/22 0810  Resp 25 09/03/22 0810  SpO2 93 % 09/03/22 0810  Vitals shown include unvalidated device data.  Last Pain:  Vitals:   09/03/22 0736  TempSrc: Oral         Complications: No notable events documented.

## 2022-09-03 NOTE — Anesthesia Postprocedure Evaluation (Signed)
Anesthesia Post Note  Patient: Lisa Gutierrez  Procedure(s) Performed: CARDIOVERSION  Patient location during evaluation: Specials Recovery Anesthesia Type: General Level of consciousness: awake and alert Pain management: pain level controlled Vital Signs Assessment: post-procedure vital signs reviewed and stable Respiratory status: spontaneous breathing, nonlabored ventilation, respiratory function stable and patient connected to nasal cannula oxygen Cardiovascular status: blood pressure returned to baseline and stable Postop Assessment: no apparent nausea or vomiting Anesthetic complications: no   No notable events documented.   Last Vitals:  Vitals:   09/03/22 0923 09/03/22 0930  BP:  116/64  Pulse:  78  Resp:  18  SpO2: 94% 93%    Last Pain:  Vitals:   09/03/22 0930  TempSrc:   PainSc: 0-No pain                 Cleda Mccreedy Shakir Petrosino

## 2022-09-03 NOTE — Progress Notes (Signed)
Patient became really emotional after her procedure. She is anxious about her new heart issues and the possible side effects of the medication that she received this morning. She is doing better now, but still feeling a little tired from anesthesia.

## 2022-09-03 NOTE — Anesthesia Procedure Notes (Signed)
Date/Time: 09/03/2022 7:55 AM  Performed by: Ginger Carne, CRNAPre-anesthesia Checklist: Patient identified, Emergency Drugs available, Suction available, Patient being monitored and Timeout performed Patient Re-evaluated:Patient Re-evaluated prior to induction Oxygen Delivery Method: Nasal cannula Preoxygenation: Pre-oxygenation with 100% oxygen Induction Type: IV induction

## 2022-09-03 NOTE — Anesthesia Preprocedure Evaluation (Addendum)
Anesthesia Evaluation  Patient identified by MRN, date of birth, ID band Patient awake    Reviewed: Allergy & Precautions, NPO status , Patient's Chart, lab work & pertinent test results  History of Anesthesia Complications (+) Emergence Delirium and history of anesthetic complications  Airway Mallampati: III  TM Distance: >3 FB Neck ROM: full    Dental  (+) Chipped   Pulmonary former smoker   Pulmonary exam normal        Cardiovascular hypertension, Normal cardiovascular exam     Neuro/Psych negative neurological ROS  negative psych ROS   GI/Hepatic negative GI ROS, Neg liver ROS,,,  Endo/Other  diabetes    Renal/GU negative Renal ROS  negative genitourinary   Musculoskeletal   Abdominal   Peds  Hematology negative hematology ROS (+)   Anesthesia Other Findings Past Medical History: No date: Anxiety No date: Atrial fibrillation (HCC) No date: Depression No date: Depression     Comment:  Phreesia 07/02/2020 No date: Diabetes mellitus without complication (HCC) No date: GERD (gastroesophageal reflux disease) No date: Hypertension No date: IBS (irritable bowel syndrome) No date: Neuromuscular disorder [facility])  Past Surgical History: No date: BREAST EXCISIONAL BIOPSY 07/24/2022: CATARACT EXTRACTION W/PHACO; Right     Comment:  Procedure: CATARACT EXTRACTION PHACO AND INTRAOCULAR               LENS PLACEMENT (IOC) RIGHT DIABETIC  5.93  00:42.5;                Surgeon: Galen Manila, MD;  Location: [facility] SUR[facility]ERY              CNTR;  Service: Ophthalmology;  Laterality: Right; 08/07/2022: CATARACT EXTRACTION W/PHACO; Left     Comment:  Procedure: CATARACT EXTRACTION PHACO AND INTRAOCULAR               LENS PLACEMENT (IOC) LEFT DIABETIC  12.33  00:58.3;                Surgeon: Galen Manila, MD;  Location: [facility]GERY              CNTR;  Service: Ophthalmology;  Laterality: Left; No date: CESAREAN  SECTION; N/A     Comment:  Phreesia 07/02/2020 No date: CHOLECYSTECTOMY 1973: EXCISION / BIOPSY BREAST / NIPPLE / DUCT; Right     Comment:  duct removed No date: SPINE SURGERY; N/A     Comment:  Phreesia 07/02/2020 No date: TUBAL LIGATION; N/A     Comment:  Phreesia 07/02/2020     Reproductive/Obstetrics negative OB ROS                             Anesthesia Physical Anesthesia Plan  ASA: 3  Anesthesia Plan: General   Post-op Pain Management:    Induction: Intravenous  PONV Risk Score and Plan: Propofol infusion and TIVA  Airway Management Planned: Natural Airway and Nasal Cannula  Additional Equipment:   Intra-op Plan:   Post-operative Plan:   Informed Consent: I have reviewed the patients History and Physical, chart, labs and discussed the procedure including the risks, benefits and alternatives for the proposed anesthesia with the patient or authorized representative who has indicated his/her understanding and acceptance.     Dental Advisory Given  Plan Discussed with: Anesthesiologist, CRNA and Surgeon  Anesthesia Plan Comments: (Patient reports emergence delirium with propofol after her colonoscopy. We discussed that anyone with such a history would be at risk of emergence delirium  in the future. That substituting propofol for an anesthetic with benzodiazapines or ketamine would increase that risk even more.  That hopefully she will not have any problems because her exposure will be so low today but that she is still at risk. That we plan to use propofol today and she voiced assent.  Patient consented for risks of anesthesia including but not limited to:  - adverse reactions to medications - risk of airway placement if required - damage to eyes, teeth, lips or other oral mucosa - nerve damage due to positioning  - sore throat or hoarseness - Damage to heart, brain, nerves, lungs, other parts of body or loss of life  Patient voiced  understanding.)       Anesthesia Quick Evaluation

## 2022-09-03 NOTE — CV Procedure (Signed)
   TRANSESOPHAGEAL ECHOCARDIOGRAM GUIDED DIRECT CURRENT CARDIOVERSION  NAME:  Lisa Gutierrez    MRN: [id number] DOB:  09/25/1950    ADMIT DATE: 09/03/2022  INDICATIONS: Symptomatic atrial fibrillation   CARDIOVERSION:    Patient confirmed to be on anticoagulation without missed doses for > 1 month.   Indications:  Symptomatic Atrial Fibrillation  Procedure Details: The patient had the defibrillator pads placed in the anterior and posterior position. Once an appropriate level of sedation was confirmed, the patient was cardioverted x 2 with 200J of biphasic synchronized energy.  The patient converted to NSR.  There were no apparent complications.  The patient had normal neuro status and respiratory status post procedure with vitals stable as recorded elsewhere.  Adequate airway was maintained throughout and vital signs monitored per protocol.  [name] Advanced Heart Failure 8:08 AM

## 2022-09-04 ENCOUNTER — Other Ambulatory Visit: Payer: Self-pay

## 2022-09-04 ENCOUNTER — Ambulatory Visit: Payer: Medicare HMO | Admitting: Nurse Practitioner

## 2022-09-04 ENCOUNTER — Encounter: Payer: Self-pay | Admitting: Cardiovascular Disease

## 2022-09-04 MED ORDER — DIGOXIN 250 MCG PO TABS: 0.2500 mg | ORAL_TABLET | Freq: Every day | ORAL | 1 refills | Status: DC

## 2022-09-04 MED ORDER — APIXABAN 5 MG PO TABS: 5.0000 mg | ORAL_TABLET | Freq: Two times a day (BID) | ORAL | 5 refills | Status: DC

## 2022-09-04 NOTE — Telephone Encounter (Signed)
Digoxin and Eliquis medications were prescribed during hospital discharge on 07/13/22.  No prior refills from this office.  You are the only provider that has seen patient. OK to fill under you?   last visit 08/31/22--Disposition patient return to clinic to see MD/APP in 3 to 4 weeks postprocedure with EKG on return. next visit with you on 10/04/22  Thank you!

## 2022-09-04 NOTE — Telephone Encounter (Signed)
Refill request

## 2022-09-04 NOTE — Telephone Encounter (Signed)
Prescription refill request for Eliquis received. Indication:afib Last office visit:5/24 Scr:0.7 Age: 72 Weight:106.6  kg  Prescription refilled

## 2022-09-04 NOTE — Interval H&P Note (Signed)
History and Physical Interval Note:  09/04/2022 1:04 PM  Lisa Gutierrez  has presented today for surgery, with the diagnosis of Cardioversion  Afib OK 2nd case per [name] Anesthesia   Start time 8a.  The various methods of treatment have been discussed with the patient and family. After consideration of risks, benefits and other options for treatment, the patient has consented to  Procedure(s): CARDIOVERSION (N/A) as a surgical intervention.  The patient's history has been reviewed, patient examined, no change in status, stable for surgery.  I have reviewed the patient's chart and labs.  Questions were answered to the patient's satisfaction.     Lorine Bears

## 2022-09-05 ENCOUNTER — Telehealth: Payer: Self-pay | Admitting: *Deleted

## 2022-09-05 ENCOUNTER — Other Ambulatory Visit: Payer: Self-pay | Admitting: Nurse Practitioner

## 2022-09-05 ENCOUNTER — Telehealth: Payer: Self-pay

## 2022-09-05 ENCOUNTER — Telehealth: Payer: Self-pay | Admitting: Cardiology

## 2022-09-05 DIAGNOSIS — R062 Wheezing: Secondary | ICD-10-CM

## 2022-09-05 MED ORDER — ALBUTEROL SULFATE HFA 108 (90 BASE) MCG/ACT IN AERS
2.0000 | INHALATION_SPRAY | Freq: Four times a day (QID) | RESPIRATORY_TRACT | 2 refills | Status: DC | PRN
Start: 2022-09-05 — End: 2022-12-27

## 2022-09-05 NOTE — Telephone Encounter (Addendum)
Pt calling requesting to speak to [facility] nurse, she said it is about the procedure that she had on the 20th.  Looks like she has also reached out to cardiology according to her chart.  Please give pt a call.

## 2022-09-05 NOTE — Telephone Encounter (Signed)
Called pt she is advised of her Rx that was sent to the pharmacy 

## 2022-09-05 NOTE — Telephone Encounter (Signed)
Called pt she stated that she started wheezing and had a dry cough so PCP is advised she will be sending in Rx for patient

## 2022-09-05 NOTE — Telephone Encounter (Signed)
Spoke with patient and she stated that she had a cardioversion on 09/03/22 and last night (09/04/22) she felt as though she couldn't catch her breath. She stated she had a coughing fit when trying to lie down and then it felt worse like she couldn't breath. Patient took her vital signs as follows: BP 133/79 HR 79. Informed patient that everybody experiences palpitations at some point in their lives. In many circumstances it is simply a heightened awareness of the heart's normal beating. People with anxiety or stress may describe a 'rapid heartbeat' or 'pounding' in their chest from their heart beating. Occasional extra heartbeats are experienced by most everyone. Lack of sleep, stress, and caffeinated beverages can aggravate this condition. Patient denies n/v, chest pain, uncontrolled pain, fever, and chills. Informed patient if the difficulty breath continues to present to the nearest emergency room and to call back if: * Chest pain, lightheadedness or difficulty breathing occurs * Heart beating over 140 beats / minute * More than 3 extra or skipped beats / minute * You become worse   Patient understood with read back

## 2022-09-05 NOTE — Telephone Encounter (Signed)
Please let the patient know that I sent prescription for rescue inhaler to her pharmacy. She should take 1 to 2 inhalations from the inhaler up to 4 times daily as needed for wheezing.  Thanks so much.   -HB

## 2022-09-05 NOTE — Telephone Encounter (Signed)
Pt states she is having some symptoms and wants to talk about it. Did not wish to go into detail.

## 2022-09-06 ENCOUNTER — Other Ambulatory Visit: Payer: Self-pay | Admitting: Cardiovascular Disease

## 2022-09-06 ENCOUNTER — Telehealth: Payer: Self-pay | Admitting: Pharmacy Technician

## 2022-09-06 DIAGNOSIS — Z5986 Financial insecurity: Secondary | ICD-10-CM

## 2022-09-06 NOTE — Progress Notes (Addendum)
[facility] Customer service manager [facility], [place])                                            [facility] Quality Pharmacy Team   09/12/22-Received MD part of [employer] application. Faxed to [employer].  09/06/2022  Lisa Gutierrez February 15, 1951 [id number]  Received patient portion(s) of patient assistance application(s) for Eliquis. Faxed completed application and required documents into [employer]  NOTE: have NOT received back provider portion from Dr. Jerolyn Center [name]'s office. Will refax to his office today.    [name] P. [name], CPhT [facility] [employer]  [phone]

## 2022-09-08 ENCOUNTER — Other Ambulatory Visit: Payer: Self-pay | Admitting: Nurse Practitioner

## 2022-09-08 DIAGNOSIS — I10 Essential (primary) hypertension: Secondary | ICD-10-CM

## 2022-09-17 ENCOUNTER — Telehealth: Payer: Self-pay

## 2022-09-17 ENCOUNTER — Telehealth: Payer: Self-pay | Admitting: *Deleted

## 2022-09-17 DIAGNOSIS — E1165 Type 2 diabetes mellitus with hyperglycemia: Secondary | ICD-10-CM

## 2022-09-17 DIAGNOSIS — I10 Essential (primary) hypertension: Secondary | ICD-10-CM

## 2022-09-17 DIAGNOSIS — I4891 Unspecified atrial fibrillation: Secondary | ICD-10-CM

## 2022-09-17 NOTE — Telephone Encounter (Signed)
Pt calling requesting medication to be sent in for poison oak.  She said she had something sent in previously that worked well. Please advise.

## 2022-09-17 NOTE — Progress Notes (Signed)
  Care Coordination   Note   09/17/2022 Name: [name] MRN: [id number] DOB: 09-21-1950  Lisa Gutierrez is a 72 y.o. year old female who sees Boscia, Kathlynn Grate, NP for primary care. I reached out to [name] by phone today to offer care coordination services.  [name] was given information about Care Coordination services today including:   The Care Coordination services include support from the care team which includes your Nurse Coordinator, Clinical Social Worker, or Pharmacist.  The Care Coordination team is here to help remove barriers to the health concerns and goals most important to you. Care Coordination services are voluntary, and the patient may decline or stop services at any time by request to their care team member.   Care Coordination Consent Status: Patient agreed to services and verbal consent obtained.   Follow up plan:  Telephone appointment with care coordination team member scheduled for:  09/19/22  Encounter Outcome:  Pt. Scheduled [facility] Coordination Care Guide  Direct Dial: [phone]

## 2022-09-18 ENCOUNTER — Other Ambulatory Visit: Payer: Self-pay | Admitting: Nurse Practitioner

## 2022-09-18 DIAGNOSIS — L237 Allergic contact dermatitis due to plants, except food: Secondary | ICD-10-CM

## 2022-09-18 MED ORDER — CLOBETASOL PROPIONATE 0.05 % EX OINT
TOPICAL_OINTMENT | Freq: Two times a day (BID) | CUTANEOUS | 2 refills | Status: DC
  Filled 2023-12-05: qty 30, 15d supply, fill #0

## 2022-09-18 NOTE — Telephone Encounter (Signed)
Pt calling for an update on poison oak medication

## 2022-09-18 NOTE — Telephone Encounter (Signed)
I didn't see anything but clobetasol ointment which could have been helpful. I sent this to [facility] for her. She should apply it twice daily until this is improved. Thanks so much.   -HB

## 2022-09-19 ENCOUNTER — Ambulatory Visit: Payer: Self-pay

## 2022-09-20 NOTE — Patient Outreach (Signed)
  Care Coordination   Initial Visit Note   09/19/2022 Name: Lisa Gutierrez MRN: [id number] DOB: 1950-05-21  Lisa Gutierrez is a 72 y.o. year old female who sees Boscia, Kathlynn Grate, NP for primary care. I spoke with  [name] by phone today.  What matters to the patients health and wellness today?  I  had a conversation with [name], and she is worried about her health. She would like to better understand her condition and what is happening with her body. She also wants to know what she can expect in the future. My plan is to send her some educational materials that we can review together in future conversations.     Goals Addressed             This Visit's Progress    I want help to understand my conditions so I can prepare for the furture       Patient Goals/Self Care Activities: -Patient/Caregiver will self-administer medications as prescribed as evidenced by self-report/primary caregiver report  -Patient/Caregiver will attend all scheduled provider appointments as evidenced by clinician review of documented attendance to scheduled appointments and patient/caregiver report -Patient/Caregiver will call pharmacy for medication refills as evidenced by patient report and review of pharmacy fill history as appropriate -Patient/Caregiver will call provider office for new concerns or questions as evidenced by review of documented incoming telephone call notes and patient report -Patient/Caregiver verbalizes understanding of plan -Patient/Caregiver will focus on medication adherence by taking medications as prescribed  -Checks BP and records as discussed -Follows a low sodium diet/DASH diet -check blood sugar at prescribed times -record values and write them down take them to all doctor visits   -Take the medications prescribed to control your heart rate and rhythm and reduce your risk of blood clot formation (anticoagulants or antiplatelet medications/"blood  thinners"). -Learn to check your pulse and write down the number every day.          SDOH assessments and interventions completed:  Yes  SDOH Interventions Today    Flowsheet Row Most Recent Value  SDOH Interventions   Food Insecurity Interventions Intervention Not Indicated  Transportation Interventions Intervention Not Indicated        Care Coordination Interventions:  Yes, provided   Interventions Today    Flowsheet Row Most Recent Value  Chronic Disease   Chronic disease during today's visit Diabetes, Hypertension (HTN), Atrial Fibrillation (AFib)  General Interventions   General Interventions Discussed/Reviewed General Interventions Discussed, General Interventions Reviewed  Safety Interventions   Safety Discussed/Reviewed Safety Discussed          Follow up plan: Follow up call scheduled for 09/26/22  1030 am    Encounter Outcome:  Pt. Visit Completed   Juanell Fairly RN, BSN, [facility] Care Coordinator [facility]   Phone: [phone]

## 2022-09-20 NOTE — Patient Instructions (Signed)
Visit Information  Thank you for taking time to visit with me today. Please don't hesitate to contact me if I can be of assistance to you.   Following are the goals we discussed today:   Goals Addressed             This Visit's Progress    I want help to understand my conditions so I can prepare for the furture       Patient Goals/Self Care Activities: -Patient/Caregiver will self-administer medications as prescribed as evidenced by self-report/primary caregiver report  -Patient/Caregiver will attend all scheduled provider appointments as evidenced by clinician review of documented attendance to scheduled appointments and patient/caregiver report -Patient/Caregiver will call pharmacy for medication refills as evidenced by patient report and review of pharmacy fill history as appropriate -Patient/Caregiver will call provider office for new concerns or questions as evidenced by review of documented incoming telephone call notes and patient report -Patient/Caregiver verbalizes understanding of plan -Patient/Caregiver will focus on medication adherence by taking medications as prescribed  -Checks BP and records as discussed -Follows a low sodium diet/DASH diet -check blood sugar at prescribed times -record values and write them down take them to all doctor visits   -Take the medications prescribed to control your heart rate and rhythm and reduce your risk of blood clot formation (anticoagulants or antiplatelet medications/"blood thinners"). -Learn to check your pulse and write down the number every day.          Our next appointment is by telephone on 09/26/22 at 1030 am  Please call the care guide team at [phone] if you need to cancel or reschedule your appointment.   If you are experiencing a Mental Health or Behavioral Health Crisis or need someone to talk to, please call 1-800-273-TALK (toll free, 24 hour hotline)  Patient verbalizes understanding of instructions and care  plan provided today and agrees to view in MyChart. Active MyChart status and patient understanding of how to access instructions and care plan via MyChart confirmed with patient.     Lisa Fairly RN, BSN, [facility] Care Coordinator [facility]   Phone: [phone]

## 2022-09-22 ENCOUNTER — Other Ambulatory Visit: Payer: Self-pay | Admitting: Psychiatry

## 2022-09-25 ENCOUNTER — Ambulatory Visit (INDEPENDENT_AMBULATORY_CARE_PROVIDER_SITE_OTHER): Payer: Medicare HMO | Admitting: Nurse Practitioner

## 2022-09-25 ENCOUNTER — Encounter: Payer: Self-pay | Admitting: Nurse Practitioner

## 2022-09-25 ENCOUNTER — Other Ambulatory Visit: Payer: Self-pay | Admitting: Nurse Practitioner

## 2022-09-25 ENCOUNTER — Telehealth: Payer: Self-pay

## 2022-09-25 VITALS — BP 135/89 | HR 99 | Ht 66.0 in | Wt 240.0 lb

## 2022-09-25 DIAGNOSIS — N390 Urinary tract infection, site not specified: Secondary | ICD-10-CM | POA: Diagnosis not present

## 2022-09-25 DIAGNOSIS — R319 Hematuria, unspecified: Secondary | ICD-10-CM | POA: Diagnosis not present

## 2022-09-25 DIAGNOSIS — F3341 Major depressive disorder, recurrent, in partial remission: Secondary | ICD-10-CM

## 2022-09-25 DIAGNOSIS — E1159 Type 2 diabetes mellitus with other circulatory complications: Secondary | ICD-10-CM

## 2022-09-25 DIAGNOSIS — R3129 Other microscopic hematuria: Secondary | ICD-10-CM

## 2022-09-25 DIAGNOSIS — I152 Hypertension secondary to endocrine disorders: Secondary | ICD-10-CM | POA: Diagnosis not present

## 2022-09-25 DIAGNOSIS — I4891 Unspecified atrial fibrillation: Secondary | ICD-10-CM | POA: Diagnosis not present

## 2022-09-25 DIAGNOSIS — F411 Generalized anxiety disorder: Secondary | ICD-10-CM | POA: Diagnosis not present

## 2022-09-25 DIAGNOSIS — E1165 Type 2 diabetes mellitus with hyperglycemia: Secondary | ICD-10-CM

## 2022-09-25 DIAGNOSIS — Z7984 Long term (current) use of oral hypoglycemic drugs: Secondary | ICD-10-CM | POA: Diagnosis not present

## 2022-09-25 DIAGNOSIS — Z6838 Body mass index (BMI) 38.0-38.9, adult: Secondary | ICD-10-CM

## 2022-09-25 LAB — POCT GLYCOSYLATED HEMOGLOBIN (HGB A1C): HbA1c POC (<> result, manual entry): 7.3 % (ref 4.0–5.6)

## 2022-09-25 LAB — POCT URINALYSIS DIP (CLINITEK)
Bilirubin, UA: NEGATIVE
Glucose, UA: 500 mg/dL — AB
Leukocytes, UA: NEGATIVE
Nitrite, UA: NEGATIVE
POC PROTEIN,UA: NEGATIVE
Spec Grav, UA: 1.025 (ref 1.010–1.025)
Urobilinogen, UA: 0.2 E.U./dL
pH, UA: 5.5 (ref 5.0–8.0)

## 2022-09-25 LAB — POCT UA - MICROALBUMIN
Creatinine, POC: 50 mg/dL
Microalbumin Ur, POC: 80 mg/L

## 2022-09-25 MED ORDER — BLOOD GLUCOSE MONITORING SUPPL DEVI
0 refills | Status: DC
Start: 2022-09-25 — End: 2022-12-03

## 2022-09-25 MED ORDER — BLOOD GLUCOSE TEST VI STRP
ORAL_STRIP | 3 refills | Status: DC
Start: 2022-09-25 — End: 2022-12-03

## 2022-09-25 MED ORDER — LANCET DEVICE MISC
0 refills | Status: DC
Start: 2022-09-25 — End: 2022-12-03

## 2022-09-25 NOTE — Telephone Encounter (Signed)
Called pt LVM to contact the office °

## 2022-09-25 NOTE — Telephone Encounter (Signed)
Pt is requesting a call regarding prescriptions.

## 2022-09-25 NOTE — Progress Notes (Signed)
Established patient visit   Patient: Lisa Gutierrez   DOB: 01/22/1951   72 y.o. Female  MRN: [id number] Visit Date: 09/25/2022   Chief Complaint  Patient presents with   Medical Management of Chronic Issues   Subjective    HPI  Follow up  DM2 -HgbA1c is 7.3 -urine microalbumin - abnormal  -chronic yeast infection, likely due to Jardiance  -urinary frequency for past 2 weeks  New onset a-fib -seeing cardiology for this  -feelings of fatigue.  Severe anxiety/depression -seeing therapist  -wants medication to be handled per PCP   -best friend recently passed away, likely GI bleed. This  has worsened anxiety/depression     Medications: Outpatient Medications Prior to Visit  Medication Sig   acetaminophen (TYLENOL) 500 MG tablet Take 2 tablets by mouth as needed.   albuterol (VENTOLIN HFA) 108 (90 Base) MCG/ACT inhaler Inhale 2 puffs into the lungs every 6 (six) hours as needed for wheezing or shortness of breath.   apixaban (ELIQUIS) 5 MG TABS tablet Take 1 tablet (5 mg total) by mouth 2 (two) times daily.   Bioflavonoid Products (VITAMIN C) CHEW Chew by mouth daily. With vitamin D and elderberry. 2 chews daily   Calcium Carbonate (CALCIUM 500 PO) Take 500 mg by mouth 2 (two) times daily.   clobetasol ointment (TEMOVATE) 0.05 % Apply topically 2 (two) times daily. Apply topically to affected twice daily until improved.   digoxin (LANOXIN) 0.25 MG tablet Take 1 tablet (0.25 mg total) by mouth daily.   Flaxseed, Linseed, (FLAXSEED OIL PO) Take 1,500 mg by mouth 2 (two) times daily.   JANUMET 50-1000 MG tablet TAKE ONE TABLET BY MOUTH TWICE DAILY (Patient taking differently: Take 1 tablet by mouth 2 (two) times daily with a meal.)   JARDIANCE 10 MG TABS tablet TAKE 1 TABLET BY MOUTH EVERY DAY BEFORE BREAKFAST (Patient taking differently: Take 10 mg by mouth daily before breakfast.)   LINZESS 145 MCG CAPS capsule TAKE 1 CAPSULE BY MOUTH EVERY DAY   losartan (COZAAR) 25 MG  tablet Take 1 tablet (25 mg total) by mouth daily.   meclizine (ANTIVERT) 12.5 MG tablet Take 1 tablet (12.5 mg total) by mouth 3 (three) times daily as needed for dizziness.   Multiple Vitamins-Minerals (MULTIVITAMIN ADULT PO) Take 1 tablet by mouth daily.   pantoprazole (PROTONIX) 40 MG tablet TAKE 1 TABLET BY MOUTH TWICE A DAY   venlafaxine XR (EFFEXOR-XR) 150 MG 24 hr capsule Take 1 capsule (150 mg total) by mouth daily with breakfast.   [DISCONTINUED] ALPRAZolam (XANAX) 0.5 MG tablet Take 1 tablet (0.5 mg total) by mouth at bedtime as needed for anxiety or sleep.   [DISCONTINUED] furosemide (LASIX) 20 MG tablet Take 1 tablet (20 mg total) by mouth daily. (Patient taking differently: Take 20 mg by mouth as needed.)   [DISCONTINUED] metoprolol tartrate (LOPRESSOR) 50 MG tablet TAKE 1.5 TABLETS BY MOUTH 2 TIMES DAILY.   [DISCONTINUED] venlafaxine XR (EFFEXOR-XR) 37.5 MG 24 hr capsule Take 1 capsule (37.5 mg total) by mouth daily with breakfast. Take a total of 187.5 mg daily. Take along with 150 mg cap   docusate sodium (COLACE) 50 MG capsule Take 100 mg by mouth at bedtime. (Patient not taking: Reported on 09/19/2022)   fluconazole (DIFLUCAN) 150 MG tablet Take 1 tablet po every other day for 5 doses. (Patient not taking: Reported on 09/19/2022)   [DISCONTINUED] glucose blood (ACCU-CHEK SMARTVIEW) test strip 1 each by Other route in the morning and at  bedtime. Use as instructed (Patient not taking: Reported on 09/19/2022)   No facility-administered medications prior to visit.    Review of Systems See HPI    Last CBC Lab Results  Component Value Date   WBC 10.6 (H) 08/31/2022   HGB 14.7 08/31/2022   HCT 44.9 08/31/2022   MCV 91.8 08/31/2022   MCH 30.1 08/31/2022   RDW 13.4 08/31/2022   PLT 271 08/31/2022   Last metabolic panel Lab Results  Component Value Date   GLUCOSE 205 (H) 08/31/2022   NA 139 08/31/2022   K 3.7 08/31/2022   CL 101 08/31/2022   CO2 25 08/31/2022   BUN 11  08/31/2022   CREATININE 0.73 08/31/2022   GFRNONAA >60 08/31/2022   CALCIUM 10.1 08/31/2022   PROT 7.2 08/24/2021   ALBUMIN 4.3 08/24/2021   LABGLOB 2.9 08/24/2021   AGRATIO 1.5 08/24/2021   BILITOT 0.5 08/24/2021   ALKPHOS 54 08/24/2021   AST 33 08/24/2021   ALT 36 (H) 08/24/2021   ANIONGAP 13 08/31/2022   Last lipids Lab Results  Component Value Date   CHOL 195 08/24/2021   HDL 31 (L) 08/24/2021   LDLCALC 123 (H) 08/24/2021   TRIG 233 (H) 08/24/2021   CHOLHDL 6.3 (H) 08/24/2021   Last hemoglobin A1c Lab Results  Component Value Date   HGBA1C 7.3 09/25/2022   Last thyroid functions Lab Results  Component Value Date   TSH 0.908 07/10/2022        Objective     Today's Vitals   09/25/22 1055 09/25/22 1131  BP: (Abnormal) 148/90 135/89  Pulse: 99   SpO2: 96%   Weight: 240 lb (108.9 kg)   Height: 5\' 6"  (1.676 m)    Body mass index is 38.74 kg/m.  BP Readings from Last 3 Encounters:  10/04/22 130/80  09/25/22 135/89  09/03/22 116/64    Wt Readings from Last 3 Encounters:  10/04/22 236 lb 9.6 oz (107.3 kg)  09/25/22 240 lb (108.9 kg)  09/03/22 235 lb (106.6 kg)    Physical Exam Vitals and nursing note reviewed.  Constitutional:      Appearance: Normal appearance. She is well-developed.  HENT:     Head: Normocephalic and atraumatic.     Nose: Nose normal.     Mouth/Throat:     Mouth: Mucous membranes are moist.     Pharynx: Oropharynx is clear.  Eyes:     Extraocular Movements: Extraocular movements intact.     Conjunctiva/sclera: Conjunctivae normal.     Pupils: Pupils are equal, round, and reactive to light.  Neck:     Vascular: No carotid bruit.  Cardiovascular:     Rate and Rhythm: Normal rate. Rhythm irregular.     Pulses: Normal pulses.     Heart sounds: Normal heart sounds.  Pulmonary:     Effort: Pulmonary effort is normal.     Breath sounds: Normal breath sounds.  Abdominal:     Palpations: Abdomen is soft.  Genitourinary:     Comments: Urine sample positive for glucose, ketones, and trace blood.  Musculoskeletal:        General: Normal range of motion.     Cervical back: Normal range of motion and neck supple.  Lymphadenopathy:     Cervical: No cervical adenopathy.  Skin:    General: Skin is warm and dry.     Capillary Refill: Capillary refill takes less than 2 seconds.  Neurological:     General: No focal deficit present.  Mental Status: She is alert and oriented to person, place, and time.  Psychiatric:        Attention and Perception: Attention and perception normal.        Mood and Affect: Mood is anxious and depressed. Affect is tearful.        Speech: Speech normal.        Behavior: Behavior normal. Behavior is cooperative.        Thought Content: Thought content normal.        Cognition and Memory: Cognition and memory normal.        Judgment: Judgment normal.      Results for orders placed or performed in visit on 09/25/22  Urine Culture   Specimen: Urine   Urine  Result Value Ref Range   Urine Culture, Routine Final report    Organism ID, Bacteria Comment   POCT glycosylated hemoglobin (Hb A1C)  Result Value Ref Range   Hemoglobin A1C     HbA1c POC (<> result, manual entry) 7.3 4.0 - 5.6 %   HbA1c, POC (prediabetic range)     HbA1c, POC (controlled diabetic range)    POCT UA - Microalbumin  Result Value Ref Range   Microalbumin Ur, POC 80 mg/L   Creatinine, POC 50 mg/dL   Albumin/Creatinine Ratio, Urine, POC 30-300   POCT URINALYSIS DIP (CLINITEK)  Result Value Ref Range   Color, UA yellow yellow   Clarity, UA clear clear   Glucose, UA =500 (A) negative mg/dL   Bilirubin, UA negative negative   Ketones, POC UA small (15) (A) negative mg/dL   Spec Grav, UA 8.413 2.440 - 1.025   Blood, UA trace-intact (A) negative   pH, UA 5.5 5.0 - 8.0   POC PROTEIN,UA negative negative, trace   Urobilinogen, UA 0.2 0.2 or 1.0 E.U./dL   Nitrite, UA Negative Negative   Leukocytes, UA  Negative Negative    Assessment & Plan    Type 2 diabetes mellitus with hyperglycemia, without long-term current use of insulin (HCC) Assessment & Plan: HgbA1c 7.3 today.  -abnormal urine microalbumin -continue diabetic medication as prescribed  -recheck HgbA1c in 3 months   Orders: -     POCT glycosylated hemoglobin (Hb A1C) -     POCT UA - Microalbumin -     Blood Glucose Monitoring Suppl; Use 3 times daily for blood glucose monitoring. E11.65  Dispense: 1 each; Refill: 0 -     Blood Glucose Test; Blood sugar testing 2 to 3 times daily. E11.65  Dispense: 100 strip; Refill: 3 -     Lancet Device; Use for blood sugar testing 2 to 3 times daily. E11.65  Dispense: 1 each; Refill: 0  Other microscopic hematuria Assessment & Plan: Trace blood present on today's u/a -send urine for culture and sensitivity and treat as indicated  Orders: -     Urine Culture; Future -     POCT URINALYSIS DIP (CLINITEK)  Hypertension associated with type 2 diabetes mellitus (HCC) Assessment & Plan: Blood pressure stable.  -Continue current medication.    Atrial fibrillation, rapid [facility]) Assessment & Plan: Patient should continue to see cardiology on regular visit.    GAD (generalized anxiety disorder) Assessment & Plan: Patient would like for primary care to handle medications used to treat her depression and anxiety. -will continue to see therapist on regular basis.    Class 2 severe obesity due to excess calories with serious comorbidity and body mass index (BMI) of 38.0 to 38.9  in adult [facility]) Assessment & Plan: Patient has Type 2 diabetes and atrial fibrillation  -Discussed lowering calorie intake to 1500 calories per day and incorporating exercise into daily routine to help lose weight.       Return in about 3 months (around 12/26/2022) for diabetes with HgbA1c check.         Carlean Jews, NP  [facility] at [facility] [phone] (phone) [phone]  (fax)  [facility]

## 2022-09-26 ENCOUNTER — Ambulatory Visit: Payer: Self-pay

## 2022-09-26 NOTE — Patient Outreach (Signed)
  Care Coordination   Follow Up Visit Note   09/26/2022 Name: Monta Maiorana MRN: [id number] DOB: 08-Mar-1951  Serrita Lueth is a 72 y.o. year old female who sees Boscia, Kathlynn Grate, NP for primary care. I spoke with  [name] by phone today.  What matters to the patients health and wellness today?  I spoke with Mrs. Michel today. She mentioned that she had taken some medication that made her feel sleepy. She felt that she could not have a productive conversation today and requested to reschedule for next week.      SDOH assessments and interventions completed:  No     Care Coordination Interventions:  Yes, provided    Interventions Today    Flowsheet Row Most Recent Value  General Interventions   General Interventions Discussed/Reviewed General Interventions Discussed  [Patient was fatigued and wanted her dates changed]        Follow up plan: Follow up call scheduled for 10/04/22  1030 am    Encounter Outcome:  Pt. Visit Completed   Juanell Fairly RN, BSN, [facility] Care Coordinator [facility]   Phone: [phone]

## 2022-09-27 LAB — URINE CULTURE

## 2022-09-28 ENCOUNTER — Other Ambulatory Visit: Payer: Self-pay | Admitting: Nurse Practitioner

## 2022-09-28 ENCOUNTER — Telehealth: Payer: Self-pay

## 2022-09-28 DIAGNOSIS — F411 Generalized anxiety disorder: Secondary | ICD-10-CM

## 2022-09-28 NOTE — Telephone Encounter (Signed)
Pt LVM to get a callback from [place].

## 2022-10-01 NOTE — Telephone Encounter (Signed)
Patient contacted  the office wanting to know her results

## 2022-10-01 NOTE — Telephone Encounter (Signed)
Called pt LVM to contact the office °

## 2022-10-02 NOTE — Telephone Encounter (Signed)
Called pt she is advised of her lab results and recommendation pt stated that she don't want to start anything at this time

## 2022-10-02 NOTE — Telephone Encounter (Signed)
Please let the patient know that urine sample showed presence of normal bacterial flora. Does she continue to have painful urination? If so,  I can send some broad spectrum antibiotics for 5 days.

## 2022-10-04 ENCOUNTER — Ambulatory Visit: Payer: Medicare HMO | Attending: Cardiology | Admitting: Cardiology

## 2022-10-04 ENCOUNTER — Telehealth: Payer: Self-pay | Admitting: *Deleted

## 2022-10-04 ENCOUNTER — Encounter: Payer: Self-pay | Admitting: Cardiology

## 2022-10-04 VITALS — BP 130/80 | HR 85 | Ht 66.0 in | Wt 236.6 lb

## 2022-10-04 DIAGNOSIS — I480 Paroxysmal atrial fibrillation: Secondary | ICD-10-CM

## 2022-10-04 DIAGNOSIS — Z7984 Long term (current) use of oral hypoglycemic drugs: Secondary | ICD-10-CM

## 2022-10-04 DIAGNOSIS — F419 Anxiety disorder, unspecified: Secondary | ICD-10-CM

## 2022-10-04 DIAGNOSIS — E669 Obesity, unspecified: Secondary | ICD-10-CM | POA: Diagnosis not present

## 2022-10-04 DIAGNOSIS — I10 Essential (primary) hypertension: Secondary | ICD-10-CM

## 2022-10-04 DIAGNOSIS — E1165 Type 2 diabetes mellitus with hyperglycemia: Secondary | ICD-10-CM

## 2022-10-04 DIAGNOSIS — R600 Localized edema: Secondary | ICD-10-CM | POA: Diagnosis not present

## 2022-10-04 NOTE — Patient Instructions (Addendum)
Medication Instructions:  No changes at this time.   *If you need a refill on your cardiac medications before your next appointment, please call your pharmacy*   Lab Work: None  If you have labs (blood work) drawn today and your tests are completely normal, you will receive your results only by: MyChart Message (if you have MyChart) OR A paper copy in the mail If you have any lab test that is abnormal or we need to change your treatment, we will call you to review the results.   Testing/Procedures: None   Follow-Up: At [facility], you and your health needs are our priority.  As part of our continuing mission to provide you with exceptional heart care, we have created designated Provider Care Teams.  These Care Teams include your primary Cardiologist (physician) and Advanced Practice Providers (APPs -  Physician Assistants and Nurse Practitioners) who all work together to provide you with the care you need, when you need it.    Your next appointment:   6 week(s)  Provider:   Lorine Bears, MD or Charlsie Quest, NP    PATIENT NEEDS EKG AT NEXT VISIT

## 2022-10-04 NOTE — Patient Outreach (Signed)
CSW made contact with pt who reports heading to MD. Scheduled intake assessment with CSW for Monday.   Reece Levy, MSW, LCSW Clinical Social Worker [facility] [phone]

## 2022-10-04 NOTE — Progress Notes (Signed)
Cardiology Office Note:  .   Date:  10/04/2022  ID:  Lisa Gutierrez, DOB April 17, 1950, MRN [id number] PCP: Carlean Jews, NP  [facility] Providers Cardiologist:  Lorine Bears, MD    History of Present Illness: .   Lisa Gutierrez is a 72 y.o. female with past medical history of essential hypertension, chronic allergic rhinitis, gastroesophageal reflux disease, IBS with constipation, chronic superficial gastritis without bleeding, fatty liver disease (nonalcoholic), type 2 diabetes, GAD, insomnia, vertigo, history of statin intolerance, morbid obesity, and new onset atrial fibrillation status post cardioversion who is here today for follow-up regarding A-fib.  She had originally presented to the [facility] surgery center on 07/10/2022 for scheduled cataract surgery was evaluated by anesthesia prior to procedure.  Stationary heart rate of 170-190 with questions of atrial fibrillation RVR.  The procedure was canceled.  The patient remained asymptomatic and was sent to the emergency department for further evaluation.  Vital signs were stable echocardiogram revealed an LVEF of 50-55%, no regional wall motion abnormalities, no valvular abnormalities were noted.  She was started on metoprolol, dig, and apixaban and subsequently discharged on 07/12/2022.  She was followed in clinic today on 07/16/2022 where she had been tolerating her apixaban and beta-blockers without incident.  She was concerned about swelling to bilateral lower extremities need to be placed on furosemide.  She then followed up on 08/31/2022 concerned about upcoming cardioversion procedure.  She has not missed any doses of her anticoagulant and had no issues with bleeding.  She was scheduled for procedure several questions that were answered and her furosemide was decreased to an as-needed basis as the swelling to her bilateral lower extremities greatly improved.  She returns to clinic today with several concerns.  She  continues to state that she has fatigue, she is sleepy.  Her blood sugars have been elevated.  She also is concerned today.  She has been advised that she will have a new PCP as her current PCP is changing to oncology.  She has had some confusion and memory changes.  She is also been on an increased amount of stress as of late as her best friend passed away she recently had to deal with the funeral things of that sort.  She did previously undergo cardioversion procedure that was successful.  She stated that after she got home she did have a dry throat and a scratchy throat and became more wheezy and was coughing.  She had called her primary care provider's office that had sent her in any inhaler at that time which she stated she did not think really did any good.  She states that the symptoms have resolved.  She has a large amount of anxiety but also is extremely depressed.  She denies any recent hospitalizations or visits to the emergency department.  ROS: 10 point review of systems has been completed and considered negative with exception of what is listed in the HPI  Studies Reviewed: Marland Kitchen      EKG: Rate controlled atrial fibrillation with a rate of 85, left axis deviation, no acute change, unfortunately after cardioversion she was in sinus rhythm but has reverted back to atrial fibrillation today.  DCCV 09/03/22 The patient had the defibrillator pads placed in the anterior and posterior position. Once an appropriate level of sedation was confirmed, the patient was cardioverted x 2 with 200J of biphasic synchronized energy.  The patient converted to NSR.  There were no apparent complications.  The patient had normal  neuro status and respiratory status post procedure with vitals stable as recorded elsewhere.  Adequate airway was maintained throughout and vital signs monitored per protocol.   TTE 07/10/22 1. Left ventricular ejection fraction, by estimation, is 50 to 55%. The  left ventricle has low normal  function. The left ventricle has no regional  wall motion abnormalities. There is mild left ventricular hypertrophy.  Left ventricular diastolic  parameters are indeterminate.   2. Right ventricular systolic function is normal. The right ventricular  size is normal. Tricuspid regurgitation signal is inadequate for assessing  PA pressure.   3. Left atrial size was moderately dilated.   4. The mitral valve is normal in structure. No evidence of mitral valve  regurgitation. No evidence of mitral stenosis.   5. The aortic valve is normal in structure. Aortic valve regurgitation is  not visualized. No aortic stenosis is present.   Risk Assessment/Calculations:    CHA2DS2-VASc Score = 4   This indicates a 4.8% annual risk of stroke. The patient's score is based upon: CHF History: 0 HTN History: 1 Diabetes History: 1 Stroke History: 0 Vascular Disease History: 0 Age Score: 1 Gender Score: 1            Physical Exam:   VS:  BP 130/80 (BP Location: Left Arm, Patient Position: Sitting, Cuff Size: Large)   Pulse 85   Ht 5\' 6"  (1.676 m)   Wt 236 lb 9.6 oz (107.3 kg)   SpO2 96%   BMI 38.19 kg/m    Wt Readings from Last 3 Encounters:  10/04/22 236 lb 9.6 oz (107.3 kg)  09/25/22 240 lb (108.9 kg)  09/03/22 235 lb (106.6 kg)    GEN: Well nourished, well developed in no acute distress NECK: No JVD; No carotid bruits CARDIAC: IR IR, no murmurs, rubs, gallops RESPIRATORY:  Clear to auscultation without rales, wheezing or rhonchi  ABDOMEN: Soft, non-tender, non-distended EXTREMITIES:  1+  edema BLE; No deformity   ASSESSMENT AND PLAN: .   Paroxysmal atrial fibrillation status post DCCV.  She is in sinus rhythm after cardioversion but unfortunately today EKG reveals she is rate controlled atrial fibrillation.  She is continued on apixaban 5 mg twice daily for CHA2DS2-VASc score of at least 5 without any concerns of bleeding for stroke prophylaxis.  She is also continued on dig 0.25 mg  daily and metoprolol tartrate 50 to 75 mg twice daily.  Patient states that she has continued to have fatigue but has not had any further palpitations or shortness of breath since her cardioversion.  With her anxiety unlikely to repeat cardioversion on return we will consider EP consultation.  Essential hypertension blood pressure today 130/80 she also brought in blood pressure log from her blood pressures fairly remained stable on her current medication regimen.  She is continued on losartan.  She is also encouraged to continue to take her blood pressure 1 to 2 hours post medications.  Peripheral edema has improved show follow-up for extremities.  She has been continued on furosemide with taking medication on as-needed basis.  She is also been advised for peripheral edema, shortness of breath, weight gain she can also take her furosemide as well.  Type 2 diabetes patient is continued on her current medication regimen.  She states her sugars have been elevated and has been encouraged to follow back up with her PCP.  We did advise her that stress can increase her blood sugars as well.  This continues to be managed by her  PCP.  General anxiety disorder.  She is continued on Xanax and she is also on Effexor.  She will see a counselor but feels it was not therapeutic.  She has been encouraged to continue with her outpatient therapies.  Obesity with a BMI of 38.19.  Patient has been encouraged to continue activity as tolerated and decrease calorie intake.  She states that she does want to start doing some walking and a mild exercise she is encouraged to do this.       Dispo: Disposition patient return to clinic to see MD/APP in 6 weeks or sooner if needed with an EKG on return to reevaluate atrial fibrillation and potential repeat DCCV or referral to EP.  Signed, Caspian Deleonardis, NP

## 2022-10-08 ENCOUNTER — Other Ambulatory Visit: Payer: Self-pay | Admitting: Cardiology

## 2022-10-08 ENCOUNTER — Telehealth: Payer: Self-pay | Admitting: *Deleted

## 2022-10-08 ENCOUNTER — Encounter: Payer: Self-pay | Admitting: *Deleted

## 2022-10-08 NOTE — Addendum Note (Signed)
Addended by: Auburn Bilberry D on: 10/08/2022 04:12 PM   Modules accepted: Orders

## 2022-10-08 NOTE — Patient Outreach (Signed)
  Care Coordination   Initial Visit Note   10/08/2022 Name: Lisa Gutierrez MRN: [id number] DOB: Jul 23, 1950  Lisa Gutierrez is a 72 y.o. year old female who sees Boscia, Kathlynn Grate, NP for primary care. I spoke with  [name] by phone today.  What matters to the patients health and wellness today?  Pt is not available for scheduled phone visit today as planned- has granddaughter with her and would like callback. Rescheduled CSW initial call for 10/15/22 per pt request.     Goals Addressed   None     SDOH assessments and interventions completed:  No     Care Coordination Interventions:  No, not indicated   Follow up plan: Follow up call scheduled for 10/15/22    Encounter Outcome:  Pt. Visit Completed

## 2022-10-10 ENCOUNTER — Telehealth: Payer: Self-pay

## 2022-10-10 ENCOUNTER — Telehealth: Payer: Self-pay | Admitting: Pharmacy Technician

## 2022-10-10 ENCOUNTER — Other Ambulatory Visit: Payer: Self-pay | Admitting: Cardiovascular Disease

## 2022-10-10 DIAGNOSIS — Z5986 Financial insecurity: Secondary | ICD-10-CM

## 2022-10-10 NOTE — Progress Notes (Addendum)
[facility] Customer service manager [facility])                                            [facility] Quality Pharmacy Team    10/10/2022  Lisa Gutierrez February 04, 1951 [id number]  Care coordination call placed to [employer] in regard to [facility] application.  Spoke to [employer] at [employer] who informs patient is APPROVED 10/09/22-04/16/23. Initial shipment will automatically process and ship to the patient's home.  Subsequent refills will need to be phoned in by the patient. The patient can either call [employer] at [phone] or [employer]) at [phone]. Patient will need to call when she has about a 14 days supply remaining to allow for processing and delivery.  [name], CPhT [facility] Office: [phone] Fax: [phone] Email: [email or web address]

## 2022-10-10 NOTE — Telephone Encounter (Signed)
Pt is wanting to report that her Blood sugars have been 170 in the morning from over a month.   Pt started taking these medication while in the hospital: digoxin (LANOXIN) 0.25 MG tablet  apixaban (ELIQUIS) 5 MG TABS tablet  losartan (COZAAR) 25 MG tablet  metoprolol tartrate (LOPRESSOR) 50 MG tablet    Should she increase JANUMET 50-1000 MG tablet or should she start taking something else.

## 2022-10-11 NOTE — Telephone Encounter (Signed)
When she was in the office, her HgbA1c was actually better than it had been. I don't think we need to change anything right now. Is she checking her blood sugars any other time of the day?

## 2022-10-12 ENCOUNTER — Telehealth: Payer: Self-pay | Admitting: Pharmacy Technician

## 2022-10-12 DIAGNOSIS — Z5986 Financial insecurity: Secondary | ICD-10-CM

## 2022-10-12 NOTE — Telephone Encounter (Signed)
Please encourage her to test twice daily. A second time in the evenings. Thanks so much.   -HB

## 2022-10-12 NOTE — Progress Notes (Signed)
[facility])                                            [facility] Team    10/12/2022  Jazmere Stailey 10-Feb-1951 [id number]  Care coordination call placed to [facility] in regard to Eliquis application.  Spoke to [place] who informs patient has not met the OOP requirement of 3% of household income.   She informs that the OOP report that the patient submitted for her, and her husband was from 2023 and they need 2024.  Successful outreach to patient. HIPAA verified. Informed patient the report she got from [facility] was from 2023 and [facility] needs 2024. She informs she will get the new report and send it to me.   Received the updated 2024 OOP report from patient and submitted to [facility].  [name], CPhT [facility] Office: [phone] Fax: [phone] Email: [email or web address]

## 2022-10-12 NOTE — Telephone Encounter (Signed)
Left VM to advise her to check her BS BID morning and evening. Also advised her that the A1C was better than in the past and that [facility] didn't feel like changes to medications were needed at this time. FYI

## 2022-10-13 DIAGNOSIS — R3129 Other microscopic hematuria: Secondary | ICD-10-CM | POA: Insufficient documentation

## 2022-10-13 NOTE — Assessment & Plan Note (Signed)
HgbA1c 7.3 today.  -abnormal urine microalbumin -continue diabetic medication as prescribed  -recheck HgbA1c in 3 months

## 2022-10-13 NOTE — Assessment & Plan Note (Signed)
Blood pressure stable. Continue current medication.  

## 2022-10-13 NOTE — Assessment & Plan Note (Signed)
Patient has Type 2 diabetes and atrial fibrillation  -Discussed lowering calorie intake to 1500 calories per day and incorporating exercise into daily routine to help lose weight.

## 2022-10-13 NOTE — Assessment & Plan Note (Signed)
Patient would like for primary care to handle medications used to treat her depression and anxiety. -will continue to see therapist on regular basis.

## 2022-10-13 NOTE — Assessment & Plan Note (Signed)
>>  ASSESSMENT AND PLAN FOR ATRIAL FIBRILLATION, RAPID (HCC) WRITTEN ON 10/13/2022 11:56 PM BY BOSCIA, HEATHER E, NP  Patient should continue to see cardiology on regular visit.

## 2022-10-13 NOTE — Assessment & Plan Note (Signed)
Patient should continue to see cardiology on regular visit.

## 2022-10-14 NOTE — Assessment & Plan Note (Signed)
Trace blood present on today's u/a -send urine for culture and sensitivity and treat as indicated

## 2022-10-15 ENCOUNTER — Ambulatory Visit: Payer: Self-pay | Admitting: *Deleted
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
## 2022-10-15 NOTE — Patient Instructions (Signed)
Visit Information  Thank you for taking time to visit with me today. Please don't hesitate to contact me if I can be of assistance to you.   Following are the goals we discussed today:   Goals Addressed             This Visit's Progress    Reduce symptoms related to depression and  anxiety          Our next appointment is by telephone on 11/12/22  Please call the care guide team at [phone] if you need to cancel or reschedule your appointment.   If you are experiencing a Mental Health or Behavioral Health Crisis or need someone to talk to, please call the Suicide and Crisis Lifeline: 988 call 911   The patient verbalized understanding of instructions, educational materials, and care plan provided today and DECLINED offer to receive copy of patient instructions, educational materials, and care plan.   Telephone follow up appointment with care management team member scheduled for: 11/12/22  Reece Levy, MSW, LCSW Clinical Social Worker [facility] [phone]

## 2022-10-17 ENCOUNTER — Ambulatory Visit: Payer: Self-pay

## 2022-10-17 NOTE — Patient Instructions (Signed)
Visit Information  Thank you for taking time to visit with me today. Please don't hesitate to contact me if I can be of assistance to you.   Following are the goals we discussed today:   Goals Addressed             This Visit's Progress    I want help to understand my conditions so I can prepare for the furture       Patient Goals/Self Care Activities: -Patient/Caregiver will self-administer medications as prescribed as evidenced by self-report/primary caregiver report  -Patient/Caregiver will attend all scheduled provider appointments as evidenced by clinician review of documented attendance to scheduled appointments and patient/caregiver report -Patient/Caregiver will call pharmacy for medication refills as evidenced by patient report and review of pharmacy fill history as appropriate -Patient/Caregiver will call provider office for new concerns or questions as evidenced by review of documented incoming telephone call notes and patient report -Patient/Caregiver verbalizes understanding of plan -Patient/Caregiver will focus on medication adherence by taking medications as prescribed  -Checks BP and records as discussed -Follows a low sodium diet/DASH diet -check blood sugar at prescribed times -record values and write them down take them to all doctor visits   -Take the medications prescribed to control your heart rate and rhythm and reduce your risk of blood clot formation (anticoagulants or antiplatelet medications/"blood thinners"). -Learn to check your pulse and write down the number every day.  -Learn what we discussed today about AFIB.  Don't get overwhelmed.  Read the information that I sent you about AFIB.  At our next talk we will discuss Hypertension.         Our next appointment is by telephone on 11/21/22 at 11 am  Please call the care guide team at [phone] if you need to cancel or reschedule your appointment.   If you are experiencing a Mental Health or  Behavioral Health Crisis or need someone to talk to, please call 1-800-273-TALK (toll free, 24 hour hotline)  Patient verbalizes understanding of instructions and care plan provided today and agrees to view in MyChart. Active MyChart status and patient understanding of how to access instructions and care plan via MyChart confirmed with patient.     Lisa Fairly RN, BSN, [facility] Care Coordinator [facility]   Phone: [phone]

## 2022-10-17 NOTE — Patient Outreach (Signed)
  Care Coordination   Follow Up Visit Note   10/17/2022 Name: Lisa Gutierrez MRN: [id number] DOB: Mar 08, 1951  Lisa Gutierrez is a 72 y.o. year old female who sees Boscia, Kathlynn Grate, NP for primary care. I spoke with  Lisa Gutierrez by phone today.  What matters to the patients health and wellness today?  I had a conversation with Lisa Gutierrez this morning, and she expressed a lot of anxiety about her health conditions and understanding what is happening with her body. Today, we went over her AFib, discussing what it is, the signs and symptoms, the things she needs to monitor, the medications she takes for AFib, as well as her heart and blood pressure. We also reviewed the AFib action plan, and using it as a daily evaluation tool. She is monitoring her blood pressure and heart rate on a daily basis. Although she had just woken up and hadn't checked it yet, yesterday her blood pressure was 117/70 and her heart rate was 88. I provided her with the information and asked her to go through the details about AFib.    Goals Addressed             This Visit's Progress    I want help to understand my conditions so I can prepare for the furture       Patient Goals/Self Care Activities: -Patient/Caregiver will self-administer medications as prescribed as evidenced by self-report/primary caregiver report  -Patient/Caregiver will attend all scheduled provider appointments as evidenced by clinician review of documented attendance to scheduled appointments and patient/caregiver report -Patient/Caregiver will call pharmacy for medication refills as evidenced by patient report and review of pharmacy fill history as appropriate -Patient/Caregiver will call provider office for new concerns or questions as evidenced by review of documented incoming telephone call notes and patient report -Patient/Caregiver verbalizes understanding of plan -Patient/Caregiver will focus on medication  adherence by taking medications as prescribed  -Checks BP and records as discussed -Follows a low sodium diet/DASH diet -check blood sugar at prescribed times -record values and write them down take them to all doctor visits   -Take the medications prescribed to control your heart rate and rhythm and reduce your risk of blood clot formation (anticoagulants or antiplatelet medications/"blood thinners"). -Learn to check your pulse and write down the number every day.  -Learn what we discussed today about AFIB.  Don't get overwhelmed.  Read the information that I sent you about AFIB.  At our next talk we will discuss Hypertension.         SDOH assessments and interventions completed:  No     Care Coordination Interventions:  Yes, provided   Interventions Today    Flowsheet Row Most Recent Value  Chronic Disease   Chronic disease during today's visit Atrial Fibrillation (AFib)  General Interventions   General Interventions Discussed/Reviewed General Interventions Discussed, General Interventions Reviewed  Education Interventions   Education Provided Provided Education  Provided Verbal Education On When to see the doctor  Mental Health Interventions   Mental Health Discussed/Reviewed Anxiety, Depression  Pharmacy Interventions   Pharmacy Dicussed/Reviewed Pharmacy Topics Reviewed  Safety Interventions   Safety Discussed/Reviewed Safety Discussed        Follow up plan: Follow up call scheduled for 11/21/22  11 am    Encounter Outcome:  Pt. Visit Completed   Lisa Fairly RN, BSN, [facility] Care Coordinator [facility]   Phone: [phone]

## 2022-10-22 ENCOUNTER — Telehealth: Payer: Self-pay | Admitting: Cardiovascular Disease

## 2022-10-22 ENCOUNTER — Encounter: Payer: Medicare HMO | Admitting: *Deleted

## 2022-10-22 NOTE — Telephone Encounter (Signed)
Spoke to patient and informed her that we are able to give her some samples of the apixaban (ELIQUIS) 5 MG TABS tablet as requested. Also gave patient information for the patient assistance program in case of future lapses in coverage. Patient understood with read back

## 2022-10-22 NOTE — Telephone Encounter (Signed)
Patient calling the office for samples of medication:   1.  What medication and dosage are you requesting samples for? apixaban (ELIQUIS) 5 MG TABS tablet   2.  Are you currently out of this medication? Going into donut hole and will be out soon. Requesting samples until it is worked out.

## 2022-10-26 ENCOUNTER — Other Ambulatory Visit: Payer: Self-pay | Admitting: Nurse Practitioner

## 2022-10-26 DIAGNOSIS — F3341 Major depressive disorder, recurrent, in partial remission: Secondary | ICD-10-CM

## 2022-10-26 DIAGNOSIS — F411 Generalized anxiety disorder: Secondary | ICD-10-CM

## 2022-10-26 DIAGNOSIS — Z79899 Other long term (current) drug therapy: Secondary | ICD-10-CM

## 2022-10-28 ENCOUNTER — Other Ambulatory Visit: Payer: Self-pay | Admitting: Cardiology

## 2022-10-29 NOTE — Telephone Encounter (Signed)
With her concerns she needs to have her follow-up with her PCP moved up. With noted blood on anticoagulation she needs a CBC and BMP to evaluate kidney function and electrolytes. If possible we can move her return appointment up.

## 2022-11-01 ENCOUNTER — Other Ambulatory Visit
Admission: RE | Admit: 2022-11-01 | Discharge: 2022-11-01 | Disposition: A | Discharge status: Home / Self Care | Payer: Medicare HMO | Attending: Cardiology | Admitting: Cardiology

## 2022-11-01 DIAGNOSIS — Z79899 Other long term (current) drug therapy: Secondary | ICD-10-CM | POA: Diagnosis not present

## 2022-11-01 LAB — BASIC METABOLIC PANEL
Anion gap: 8 (ref 5–15)
BUN: 12 mg/dL (ref 8–23)
CO2: 23 mmol/L (ref 22–32)
Calcium: 9.6 mg/dL (ref 8.9–10.3)
Chloride: 102 mmol/L (ref 98–111)
Creatinine, Ser: 0.75 mg/dL (ref 0.44–1.00)
GFR, Estimated: >60 mL/min (ref >60)
Glucose, Bld: 294 mg/dL — ABNORMAL HIGH (ref 70–99)
Potassium: 4.3 mmol/L (ref 3.5–5.1)
Sodium: 133 mmol/L — ABNORMAL LOW (ref 135–145)

## 2022-11-01 LAB — CBC
HCT: 43.3 % (ref 36.0–46.0)
Hemoglobin: 14.2 g/dL (ref 12.0–15.0)
MCH: 29.6 pg (ref 26.0–34.0)
MCHC: 32.8 g/dL (ref 30.0–36.0)
MCV: 90.4 fL (ref 80.0–100.0)
Platelets: 252 10*3/uL (ref 150–400)
RBC: 4.79 MIL/uL (ref 3.87–5.11)
RDW: 13.6 % (ref 11.5–15.5)
WBC: 10.9 10*3/uL — ABNORMAL HIGH (ref 4.0–10.5)
nRBC: 0 % (ref 0.0–0.2)

## 2022-11-01 NOTE — Telephone Encounter (Signed)
Nothing sooner but added to wait list

## 2022-11-02 ENCOUNTER — Encounter: Payer: Self-pay | Admitting: *Deleted

## 2022-11-02 NOTE — Progress Notes (Signed)
Please forward the results to her PCP office. Blood sugars are high and so is her white blood cell count. Worry there may be some sort of infection brewing. If she is unable to see PCP have her do urine for a UA and culture at the lab.

## 2022-11-06 ENCOUNTER — Telehealth: Payer: Self-pay | Admitting: *Deleted

## 2022-11-06 NOTE — Telephone Encounter (Signed)
Pt calling wanting to know if provider had received message from heart doctor about lab work. Pt stated that her white blood cells were elevated, and said her sugar has been elevated since getting out of hospital.  She says she feels funny weak and tired.  Mentions being on Eliquis.  Provider suggest that if she thinks Eliquis is causing problems she should speak to the provider that prescribed it.  She also said that pt can come in about 2 weeks to have some labs and then we can see if she should be seen before her scheduled appointment in September. LVM to call office to inform of this.  Please assist her in getting a lab appointment scheduled.

## 2022-11-07 ENCOUNTER — Ambulatory Visit: Payer: Self-pay | Admitting: *Deleted
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
## 2022-11-07 NOTE — Patient Instructions (Signed)
Visit Information  Thank you for taking time to visit with me today. Please don't hesitate to contact me if I can be of assistance to you.   Following are the goals we discussed today:   Goals Addressed             This Visit's Progress    Reduce symptoms related to depression and  anxiety       Activities and task to complete in order to accomplish goals.   Review and consider the additional support resources mailed to you Schedule your next counseling visit and discuss issues presented to me today-  Find ways to remove yourself from conversations that are upsetting  Call your insurance provider for more information about your Enhanced Benefits ( ) Start / continue relaxed breathing 3 times daily Keep all upcoming appointment discussed today Continue with compliance of taking medication prescribed by Doctor Consider plans for on-going plans for behavioral health care as discussed Call [phone] for mental health support/counselor 24/7   Call 911 for emergent need         Our next appointment is by telephone on 11/28/22  Please call the care guide team at [phone] if you need to cancel or reschedule your appointment.   If you are experiencing a Mental Health or Behavioral Health Crisis or need someone to talk to, please call the Suicide and Crisis Lifeline: 988 call 911   The patient verbalized understanding of instructions, educational materials, and care plan provided today and DECLINED offer to receive copy of patient instructions, educational materials, and care plan.   Telephone follow up appointment with care management team member scheduled for:  11/28/22  Reece Levy, MSW, LCSW Clinical Social Worker [facility] [phone]

## 2022-11-08 ENCOUNTER — Telehealth (INDEPENDENT_AMBULATORY_CARE_PROVIDER_SITE_OTHER): Payer: Medicare HMO | Admitting: Family Medicine

## 2022-11-08 ENCOUNTER — Encounter: Payer: Self-pay | Admitting: Family Medicine

## 2022-11-08 ENCOUNTER — Other Ambulatory Visit: Payer: Self-pay | Admitting: Cardiology

## 2022-11-08 VITALS — BP 122/50 | HR 88

## 2022-11-08 DIAGNOSIS — F3341 Major depressive disorder, recurrent, in partial remission: Secondary | ICD-10-CM

## 2022-11-08 DIAGNOSIS — Z7984 Long term (current) use of oral hypoglycemic drugs: Secondary | ICD-10-CM | POA: Diagnosis not present

## 2022-11-08 DIAGNOSIS — E1165 Type 2 diabetes mellitus with hyperglycemia: Secondary | ICD-10-CM

## 2022-11-08 DIAGNOSIS — F411 Generalized anxiety disorder: Secondary | ICD-10-CM | POA: Diagnosis not present

## 2022-11-08 MED ORDER — VENLAFAXINE HCL ER 150 MG PO CP24
150.0000 mg | ORAL_CAPSULE | Freq: Every day | ORAL | 0 refills | Status: DC
Start: 2022-11-08 — End: 2023-02-05

## 2022-11-08 MED ORDER — JANUMET 50-1000 MG PO TABS
1.0000 | ORAL_TABLET | Freq: Two times a day (BID) | ORAL | 1 refills | Status: DC
Start: 2022-11-08 — End: 2022-12-27

## 2022-11-08 MED ORDER — VENLAFAXINE HCL ER 37.5 MG PO CP24
37.5000 mg | ORAL_CAPSULE | Freq: Every day | ORAL | 0 refills | Status: DC
Start: 2022-11-08 — End: 2023-02-05

## 2022-11-08 MED ORDER — EMPAGLIFLOZIN 25 MG PO TABS: 25.0000 mg | ORAL_TABLET | Freq: Every day | ORAL | 1 refills | Status: DC

## 2022-11-08 MED ORDER — BUPROPION HCL ER (SR) 150 MG PO TB12
150.0000 mg | ORAL_TABLET | ORAL | 0 refills | Status: DC
Start: 2022-11-08 — End: 2022-12-03

## 2022-11-08 NOTE — Assessment & Plan Note (Signed)
Continue Effexor 150 mg tablet and 37.5 mg tablet daily.  We will augment with bupropion 150 mg starting with once daily.  In the future, if necessary we can increase the dose or we can switch Effexor to Lexapro but use bupropion to mitigate weight gain side effects of Lexapro.  Encourage patient to track down her therapist, but she will let me know if she needs a referral to a different 1.  Will continue to monitor.

## 2022-11-08 NOTE — Progress Notes (Signed)
Virtual Visit via Video Note  I connected with [name] on 11/08/22 at  9:50 AM EDT by a video enabled telemedicine application and verified that I am speaking with the correct person using two identifiers.  Patient Location: Home Provider Location: Office/Clinic  I discussed the limitations, risks, security, and privacy concerns of performing an evaluation and management service by video and the availability of in person appointments. I also discussed with the patient that there may be a patient responsible charge related to this service. The patient expressed understanding and agreed to proceed.    Subjective   Patient ID: Lisa Gutierrez, female    DOB: 1951-01-04  Age: 72 y.o. MRN: [id number]  Chief Complaint  Patient presents with   Not feeling well    HPI Lisa Gutierrez is a 72 y.o. female presenting today to discuss recent elevated WBC count and elevated blood sugar levels ever since hospitalization in May.  Blood sugar has been running in the high 200s which is concerning to her.  She also endorses some fatigue, dyspnea on exertion since her hospitalization.  She is taking Effexor and has been for quite some time.  It was very helpful initially but ever since her time in the hospital she has found that it is not controlling her mood symptoms and makes her more irritable than she is used to.  Previously, she has taken Lexapro but discontinued due to the weight gain that it was causing.  She was going to therapy routinely, but her therapist left the practice and she has not been able to track the therapist down at the new location yet.     11/08/2022    9:05 AM 10/15/2022    3:47 PM 09/25/2022   11:32 AM 08/09/2022    1:47 PM 08/09/2022    1:45 PM  Depression screen PHQ 2/9  Decreased Interest 3 3 3  0 0  Down, Depressed, Hopeless 3 2 2  0 0  PHQ - 2 Score 6 5 5  0 0  Altered sleeping 2 3 3  0 0  Tired, decreased energy 3 3 3  0 0  Change in appetite 3 3  3 1  0  Feeling bad or failure about yourself  3 3 3  0 0  Trouble concentrating 3 0 0 1 0  Moving slowly or fidgety/restless 2 2 2  0 0  Suicidal thoughts 0 0 0 0 0  PHQ-9 Score 22 19 19 2  0  Difficult doing work/chores Very difficult Very difficult Extremely dIfficult        11/08/2022    9:07 AM 09/25/2022   11:32 AM 07/18/2022    8:53 AM 01/01/2022    1:01 PM  GAD 7 : Generalized Anxiety Score  Nervous, Anxious, on Edge 2 2 1 3   Control/stop worrying 2 2 3 3   Worry too much - different things 3 2 3 1   Trouble relaxing 3 0 3 0  Restless 0 1 1 0  Easily annoyed or irritable 3 3 1 3   Afraid - awful might happen 2 2 1 3   Total GAD 7 Score 15 12 13 13   Anxiety Difficulty Somewhat difficult Very difficult     ROS Negative unless otherwise noted in HPI  Outpatient Medications Prior to Visit  Medication Sig   acetaminophen (TYLENOL) 500 MG tablet Take 2 tablets by mouth as needed.   albuterol (VENTOLIN HFA) 108 (90 Base) MCG/ACT inhaler Inhale 2 puffs into the lungs every 6 (six) hours as needed  for wheezing or shortness of breath.   ALPRAZolam (XANAX) 0.5 MG tablet TAKE 1 TABLET (0.5 MG TOTAL) BY MOUTH AT BEDTIME AS NEEDED FOR ANXIETY OR SLEEP.   apixaban (ELIQUIS) 5 MG TABS tablet Take 1 tablet (5 mg total) by mouth 2 (two) times daily.   Bioflavonoid Products (VITAMIN C) CHEW Chew by mouth daily. With vitamin D and elderberry. 2 chews daily   Blood Glucose Monitoring Suppl DEVI Use 3 times daily for blood glucose monitoring. E11.65   Calcium Carbonate (CALCIUM 500 PO) Take 500 mg by mouth 2 (two) times daily.   clobetasol ointment (TEMOVATE) 0.05 % Apply topically 2 (two) times daily. Apply topically to affected twice daily until improved.   digoxin (LANOXIN) 0.25 MG tablet TAKE 1 TABLET BY MOUTH EVERY DAY   Flaxseed, Linseed, (FLAXSEED OIL PO) Take 1,500 mg by mouth 2 (two) times daily.   furosemide (LASIX) 20 MG tablet TAKE 1 TABLET BY MOUTH EVERY DAY   Glucose Blood (BLOOD GLUCOSE  TEST STRIPS) STRP Blood sugar testing 2 to 3 times daily. E11.65   Lancet Device MISC Use for blood sugar testing 2 to 3 times daily. E11.65   LINZESS 145 MCG CAPS capsule TAKE 1 CAPSULE BY MOUTH EVERY DAY   meclizine (ANTIVERT) 12.5 MG tablet Take 1 tablet (12.5 mg total) by mouth 3 (three) times daily as needed for dizziness.   metoprolol tartrate (LOPRESSOR) 50 MG tablet TAKE 1.5 TABLETS BY MOUTH 2 TIMES DAILY.   Multiple Vitamins-Minerals (MULTIVITAMIN ADULT PO) Take 1 tablet by mouth daily.   pantoprazole (PROTONIX) 40 MG tablet TAKE 1 TABLET BY MOUTH TWICE A DAY   [DISCONTINUED] JANUMET 50-1000 MG tablet TAKE ONE TABLET BY MOUTH TWICE DAILY (Patient taking differently: Take 1 tablet by mouth 2 (two) times daily with a meal.)   [DISCONTINUED] JARDIANCE 10 MG TABS tablet TAKE 1 TABLET BY MOUTH EVERY DAY BEFORE BREAKFAST (Patient taking differently: Take 10 mg by mouth daily before breakfast.)   [DISCONTINUED] venlafaxine XR (EFFEXOR-XR) 150 MG 24 hr capsule Take 1 capsule (150 mg total) by mouth daily with breakfast.   [DISCONTINUED] venlafaxine XR (EFFEXOR-XR) 37.5 MG 24 hr capsule TAKE 1 CAPSULE BY MOUTH DAILY WITH BREAKFAST. TAKE ALONG WITH 150 MG CAP ( TOTAL OF 187.5 MG DAILY)   losartan (COZAAR) 25 MG tablet Take 1 tablet (25 mg total) by mouth daily.   [DISCONTINUED] docusate sodium (COLACE) 50 MG capsule Take 100 mg by mouth at bedtime. (Patient not taking: Reported on 09/19/2022)   [DISCONTINUED] fluconazole (DIFLUCAN) 150 MG tablet Take 1 tablet po every other day for 5 doses. (Patient not taking: Reported on 09/19/2022)   No facility-administered medications prior to visit.     Objective:     Physical Exam General: Speaking clearly in complete sentences without any shortness of breath.  Alert and oriented x3.  Normal judgment. No apparent acute distress.   Assessment & Plan:  Type 2 diabetes mellitus with hyperglycemia, without long-term current use of insulin (HCC) Assessment &  Plan: Due to hyperglycemia, increase Jardiance to 25 mg daily.  She needs brand name as she has a savings card from the company.  Continue Janumet 50-1000 mg tablet twice daily with meals.  Continue ambulatory blood sugar monitoring.  Will recheck A1c at next visit and adjust management as indicated by results.  Orders: -     Empagliflozin; Take 1 tablet (25 mg total) by mouth daily before breakfast.  Dispense: 90 tablet; Refill: 1 -  Janumet; Take 1 tablet by mouth 2 (two) times daily with a meal.  Dispense: 180 tablet; Refill: 1  MDD (major depressive disorder), recurrent, in partial remission (HCC) Assessment & Plan: Continue Effexor 150 mg tablet and 37.5 mg tablet daily.  We will augment with bupropion 150 mg starting with once daily.  In the future, if necessary we can increase the dose or we can switch Effexor to Lexapro but use bupropion to mitigate weight gain side effects of Lexapro.  Encourage patient to track down her therapist, but she will let me know if she needs a referral to a different 1.  Will continue to monitor.  Orders: -     Venlafaxine HCl ER; Take 1 capsule (150 mg total) by mouth daily with breakfast.  Dispense: 90 capsule; Refill: 0 -     Venlafaxine HCl ER; Take 1 capsule (37.5 mg total) by mouth daily with breakfast.  Dispense: 90 capsule; Refill: 0 -     buPROPion HCl ER (SR); Take 1 tablet (150 mg total) by mouth every morning.  Dispense: 90 tablet; Refill: 0  GAD (generalized anxiety disorder) -     Venlafaxine HCl ER; Take 1 capsule (150 mg total) by mouth daily with breakfast.  Dispense: 90 capsule; Refill: 0 -     Venlafaxine HCl ER; Take 1 capsule (37.5 mg total) by mouth daily with breakfast.  Dispense: 90 capsule; Refill: 0 -     buPROPion HCl ER (SR); Take 1 tablet (150 mg total) by mouth every morning.  Dispense: 90 tablet; Refill: 0    Return in about 1 week (around 11/15/2022) for blood work (not fasting); 6-8 weeks for follow up on mood.   I  discussed the assessment and treatment plan with the patient. The patient was provided an opportunity to ask questions, and all were answered. The patient agreed with the plan and demonstrated an understanding of the instructions.   The patient was advised to call back or seek an in-person evaluation if the symptoms worsen or if the condition fails to improve as anticipated.  The above assessment and management plan was discussed with the patient. The patient verbalized understanding of and has agreed to the management plan.   [name], PA

## 2022-11-08 NOTE — Assessment & Plan Note (Signed)
Due to hyperglycemia, increase Jardiance to 25 mg daily.  She needs brand name as she has a savings card from the company.  Continue Janumet 50-1000 mg tablet twice daily with meals.  Continue ambulatory blood sugar monitoring.  Will recheck A1c at next visit and adjust management as indicated by results.

## 2022-11-09 ENCOUNTER — Telehealth: Payer: Self-pay | Admitting: Pharmacy Technician

## 2022-11-09 DIAGNOSIS — Z5986 Financial insecurity: Secondary | ICD-10-CM

## 2022-11-09 NOTE — Progress Notes (Addendum)
[facility])                                            [facility] Quality Pharmacy Team    11/09/2022  Lisa Gutierrez [birth year] [id number]  Incoming call received from patient in regard to recent MD visit. HIPAA verified. Patient informs provider increased Jardiance from 10mg  to 25mg . She inquired how to obtain from patient assistance company. Informed patient a form would be faxed to provider's office for provider to complete and return. The completed from would be faxed back to [facility] patient assistance foundation.Once received by [facility] , they will send out new medication dose.  Patient inquired if she should continue on the 10mg  or double the dose to 20mg  until the 25mg  arrives from the patient assistance foundation. Inquired if she asked the provider this question and she informed she did not.   Plan: Faxed form to provider to sign for new dose and sent in basket message to provider to inquire on patient's behalf about what dose  to take while awaiting arrival of new dose form patient assistance company.  ADDENDUM 11/09/22 Received the following reply from [name], PA and relayed to patient: [name], PA  [name], [name], CPhT She can take 2 tabs of Jardiance 10 mg until she gets the new prescription  [name], CPhT [facility]  [facility] Office: [phone] Fax: [phone] Email: [name]

## 2022-11-15 ENCOUNTER — Telehealth: Payer: Self-pay | Admitting: Pharmacy Technician

## 2022-11-15 DIAGNOSIS — Z5986 Financial insecurity: Secondary | ICD-10-CM

## 2022-11-15 NOTE — Progress Notes (Signed)
[facility])                                            [facility] Quality Pharmacy Team    11/15/2022  Lisa Gutierrez [birth year] [id number]  Care coordination call placed to [facility] in regard to dose increase in Jardiance.  Spoke to [place] who informs new prescription received today. She informs since dose was increased she will set up a shipment to go to patient's home with an estimated delivery in the next 7-10 business days. Patient will have to call [facility] when she needs her next shipment allowing 14 business days for delivery and processing. BI phone number is [phone].  [name], CPhT [place]  [facility] Office: [phone] Fax: [phone] Email: [email or web address]

## 2022-11-20 ENCOUNTER — Encounter: Payer: Self-pay | Admitting: Cardiology

## 2022-11-20 ENCOUNTER — Telehealth: Payer: Self-pay | Admitting: *Deleted

## 2022-11-20 ENCOUNTER — Ambulatory Visit: Payer: Medicare HMO | Attending: Cardiology | Admitting: Cardiology

## 2022-11-20 VITALS — BP 140/78 | HR 97 | Ht 66.0 in | Wt 231.8 lb

## 2022-11-20 DIAGNOSIS — F411 Generalized anxiety disorder: Secondary | ICD-10-CM | POA: Diagnosis not present

## 2022-11-20 DIAGNOSIS — E1165 Type 2 diabetes mellitus with hyperglycemia: Secondary | ICD-10-CM

## 2022-11-20 DIAGNOSIS — R600 Localized edema: Secondary | ICD-10-CM | POA: Diagnosis not present

## 2022-11-20 DIAGNOSIS — I10 Essential (primary) hypertension: Secondary | ICD-10-CM | POA: Diagnosis not present

## 2022-11-20 DIAGNOSIS — E669 Obesity, unspecified: Secondary | ICD-10-CM | POA: Diagnosis not present

## 2022-11-20 DIAGNOSIS — Z7984 Long term (current) use of oral hypoglycemic drugs: Secondary | ICD-10-CM | POA: Diagnosis not present

## 2022-11-20 DIAGNOSIS — I480 Paroxysmal atrial fibrillation: Secondary | ICD-10-CM

## 2022-11-20 NOTE — Patient Instructions (Addendum)
Medication Instructions:  Your physician recommends that you continue on your current medications as directed. Please refer to the Current Medication list given to you today.  *If you need a refill on your cardiac medications before your next appointment, please call your pharmacy*   Lab Work: none If you have labs (blood work) drawn today and your tests are completely normal, you will receive your results only by: MyChart Message (if you have MyChart) OR A paper copy in the mail If you have any lab test that is abnormal or we need to change your treatment, we will call you to review the results.   Testing/Procedures: none   Follow-Up: At [facility], you and your health needs are our priority.  As part of our continuing mission to provide you with exceptional heart care, we have created designated Provider Care Teams.  These Care Teams include your primary Cardiologist (physician) and Advanced Practice Providers (APPs -  Physician Assistants and Nurse Practitioners) who all work together to provide you with the care you need, when you need it.  We recommend signing up for the patient portal called "MyChart".  Sign up information is provided on this After Visit Summary.  MyChart is used to connect with patients for Virtual Visits (Telemedicine).  Patients are able to view lab/test results, encounter notes, upcoming appointments, etc.  Non-urgent messages can be sent to your provider as well.   To learn more about what you can do with MyChart, go to [email or web address].    Your next appointment:   2-3 Month follow up  Provider:   You may see Lorine Bears, MD or one of the following Advanced Practice Providers on your designated Care Team:   Charlsie Quest, NP   Other Instructions Your provider has referred you Electrophysiology for Atrial Fibrillation

## 2022-11-20 NOTE — Progress Notes (Signed)
Cardiology Office Note:  .   Date:  11/20/2022  ID:  Lisa Gutierrez, DOB [birth year], MRN [id number] PCP: [name], PA  [facility] Providers Cardiologist:  Lorine Bears, MD    History of Present Illness: .   Lisa Gutierrez is a 72 y.o. female with a past medical history of essential hypertension, chronic allergic rhinitis, gastroesophageal reflux disease, IBS with constipation, chronic superficial gastritis without bleeding, fatty liver disease, type 2 diabetes, GAD, insomnia, vertigo, history of statin intolerance, morbid obesity, atrial fibrillation status post cardioversion, who is here today for follow-up on her paroxysmal atrial fibrillation.  She presented for cataract surgery on 07/10/2022 was 5 x 5 Anastasia was found to be in atrial fibrillation with RVR.  Heart rate of 170-190 at that time her procedure was subsequently canceled.  Patient remained asymptomatic and was sent to the emergency department for further evaluation.  Echocardiogram revealed LVEF 50-55%, no RWMA, no valvular abnormalities were noted.  She was started on metoprolol, dig, and apixaban and subsequently discharged on 07/04/2022.  She was followed in clinic on 07/16/2022 where she been tolerating her apixaban and beta-blockers without incident.  She was scheduled for cardioversion procedure after being on anticoagulant for 4 weeks uninterrupted.  She did previously undergo cardioversion that was successful unfortunately on her last visit she was noted to be back in atrial fibrillation.    She was last seen in clinic 10/04/2022 was having concerns with her blood sugars.  She will have to have a new PCP as her current PCP was changed oncology and she was confused at the time and having some memory changes.  She does continue to take her furosemide on an as-needed basis.  No other medication changes were made at that time we did discuss this and EP consultation as she was unlikely to tolerate  another cardioversion due to her anxiety.  She returns to clinic today continues to be tearful with complaints of being depressed, weak, fatigue, anxious, with multiple questions about medications.  She is also noted an increased amount of changes in her memory.  Previously she had been encouraged to follow-up with her PCP but there seems to be an issue with her seeing a PCP as there is transition from 1 to another appointment is later in September.  She does continue to follow-up with psychiatry but her counselor has moved and she was without a counselor at this time.  She still continues to suffer from PTSD related to the passing of her mother as well.  She has some occasional palpitations but does not realize when she was in atrial fibrillation today as she thought she was sinus until her EKG was done.  She states that her blood sugars have continued to be out of control and her Jardiance dosing was increased.  She has been encouraged to continue to follow-up with her PCP for the continued complaints that she has as they are primarily primary care problems.  She denies any recent hospitalizations or visits to the emergency department.  ROS: 10 point review of systems has been reviewed and considered negative with exception of what is been listed in the HPI  Studies Reviewed: Marland Kitchen   EKG Interpretation Date/Time:  Tuesday November 20 2022 10:46:20 EDT Ventricular Rate:  97 PR Interval:    QRS Duration:  80 QT Interval:  334 QTC Calculation: 424 R Axis:   -26  Text Interpretation: Atrial fibrillation Low voltage QRS Nonspecific T wave abnormality When compared with  ECG of 03-Sep-2022 08:06, PREVIOUS ECG IS PRESENT Confirmed by Charlsie Quest ([id number]) on 11/20/2022 10:57:40 AM    DCCV 09/03/22 The patient had the defibrillator pads placed in the anterior and posterior position. Once an appropriate level of sedation was confirmed, the patient was cardioverted x 2 with 200J of biphasic synchronized energy.   The patient converted to NSR.  There were no apparent complications.  The patient had normal neuro status and respiratory status post procedure with vitals stable as recorded elsewhere.  Adequate airway was maintained throughout and vital signs monitored per protocol.    TTE 07/10/22 1. Left ventricular ejection fraction, by estimation, is 50 to 55%. The  left ventricle has low normal function. The left ventricle has no regional  wall motion abnormalities. There is mild left ventricular hypertrophy.  Left ventricular diastolic  parameters are indeterminate.   2. Right ventricular systolic function is normal. The right ventricular  size is normal. Tricuspid regurgitation signal is inadequate for assessing  PA pressure.   3. Left atrial size was moderately dilated.   4. The mitral valve is normal in structure. No evidence of mitral valve  regurgitation. No evidence of mitral stenosis.   5. The aortic valve is normal in structure. Aortic valve regurgitation is  not visualized. No aortic stenosis is present.  Risk Assessment/Calculations:    CHA2DS2-VASc Score = 4   This indicates a 4.8% annual risk of stroke. The patient's score is based upon: CHF History: 0 HTN History: 1 Diabetes History: 1 Stroke History: 0 Vascular Disease History: 0 Age Score: 1 Gender Score: 1           Physical Exam:   VS:  BP (!) 140/78 (BP Location: Left Arm, Patient Position: Sitting, Cuff Size: Normal)   Pulse 97   Ht 5\' 6"  (1.676 m)   Wt 231 lb 12.8 oz (105.1 kg)   SpO2 99%   BMI 37.41 kg/m    Wt Readings from Last 3 Encounters:  11/20/22 231 lb 12.8 oz (105.1 kg)  10/04/22 236 lb 9.6 oz (107.3 kg)  09/25/22 240 lb (108.9 kg)    GEN: Well nourished, well developed in no acute distress NECK: No JVD; No carotid bruits CARDIAC: IR IR, no murmurs, rubs, gallops RESPIRATORY:  Clear to auscultation without rales, wheezing or rhonchi  ABDOMEN: Soft, non-tender, non-distended EXTREMITIES:  1+  edema  BLE; No deformity   ASSESSMENT AND PLAN: .   Paroxysmal atrial fibrillation status post DCCV.  She is atrial fibrillation on EKG today that is rate controlled.  She is continued on apixaban 5 mg twice daily for CHA2DS2-VASc is 5 for stroke prophylaxis.  She is also continued on digoxin 0.25 mg after monitoring 75 mg twice daily.  With continued complaints of worsening fatigue, positive beta-blocker.  With anxiety it is unlikely repeat cardioversion well.  Maintaining sinus rhythm.  With her continued symptoms apprehension about repeat cardioversion referred to EP.  Essential hypertension with blood pressure 140/78.  She is continued on her current medication regimen.  She is to send back to clinic with her medication dosages that she is unsure what they are.  She has been encouraged to continue to monitor her blood pressure 1 to 2 hours post medications.  Peripheral edema which is improved.  She has been continued on furosemide on an as needed basis.  General anxiety disorder which she is on several medications that have been titrated by psychiatry.  She has been encouraged to continue to follow-up with  counseling.  She has been encouraged to reach out to psychiatry as she has several questions related to her medication if she is actually taking them as appropriate.  There are questions if she is actually taking the Wellbutrin that she has been prescribed at this time.  Type 2 diabetes with blood sugars that have not been well-controlled as of late.  PCP had just increased her Jardiance.  This continues to be managed by her PCP.  Obesity with a BMI of 37.4.  She is encouraged to continue with her activity and decrease caloric intake.  With her depression and anxiety that is difficult to get her to be any more active than she has at this time which complicates her overall treatment.      Dispo: Patient to return to see MD/APP in 3 months or sooner if needed to reevaluate symptoms.  Signed, Willmar Stockinger, NP

## 2022-11-20 NOTE — Telephone Encounter (Signed)
Pt calling to say that she is supposed to have labs drawn and wanted to know if she could have them drawn at heart doctor so she doesn't have to come all the way here, I told her that would be fine that they may need to reorder them or we could fax the order to [facility] location of her choice. Also she mentions being on 4 different depression medications she said she wants to know if she can just take one.  She said she has so many medications and she misses some of them because there are so many. Please advise.

## 2022-11-21 ENCOUNTER — Ambulatory Visit: Payer: Self-pay

## 2022-11-21 NOTE — Patient Instructions (Signed)
Visit Information  Thank you for taking time to visit with me today. Please don't hesitate to contact me if I can be of assistance to you.   Following are the goals we discussed today:   Goals Addressed             This Visit's Progress    I want help to understand my conditions so I can prepare for the furture       Patient Goals/Self Care Activities: -Patient/Caregiver will self-administer medications as prescribed as evidenced by self-report/primary caregiver report  -Patient/Caregiver will attend all scheduled provider appointments as evidenced by clinician review of documented attendance to scheduled appointments and patient/caregiver report -Patient/Caregiver will call pharmacy for medication refills as evidenced by patient report and review of pharmacy fill history as appropriate -Patient/Caregiver will call provider office for new concerns or questions as evidenced by review of documented incoming telephone call notes and patient report -Patient/Caregiver will focus on medication adherence by taking medications as prescribed  -Checks BP and records as discussed -check blood sugar at prescribed times -record values and write them down take them to all doctor visits   -Take the medications prescribed to control your heart rate and rhythm and reduce your risk of blood clot formation (anticoagulants or antiplatelet medications/"blood thinners"). -Learn to check your pulse and write down the number every day.  -Learn what we discussed today about AFIB.  Don't get overwhelmed.  Read the information that I sent you about AFIB.  At our next talk we will discuss Hypertension.         Our next appointment is by telephone on 12/19/22 at 1130 am  Please call the care guide team at [phone] if you need to cancel or reschedule your appointment.   If you are experiencing a Mental Health or Behavioral Health Crisis or need someone to talk to, please call 1-800-273-TALK (toll free, 24  hour hotline)  Patient verbalizes understanding of instructions and care plan provided today and agrees to view in MyChart. Active MyChart status and patient understanding of how to access instructions and care plan via MyChart confirmed with patient.     Juanell Fairly RN, BSN, [facility] Care Coordinator [facility]   Phone: [phone]

## 2022-11-21 NOTE — Telephone Encounter (Signed)
Message left to call and schedule.

## 2022-11-21 NOTE — Patient Outreach (Signed)
  Care Coordination   Follow Up Visit Note   11/21/2022 Name: Lisa Gutierrez MRN: [id number] DOB: November 23, 1950  Lisa Gutierrez is a 72 y.o. year old female who sees Lisa Gutierrez, [place] for primary care. I spoke with  Lisa Gutierrez by phone today.  What matters to the patients health and wellness today?  Lisa Gutierrez is very distressed about her AFIB. She is questioning her medications and experiencing increased anxiety. I have advised her to call her counselor and psychiatrist. She stated that her blood pressure yesterday was 117/65, and her pulse was 88.     Goals Addressed             This Visit's Progress    I want help to understand my conditions so I can prepare for the furture       Patient Goals/Self Care Activities: -Patient/Caregiver will self-administer medications as prescribed as evidenced by self-report/primary caregiver report  -Patient/Caregiver will attend all scheduled provider appointments as evidenced by clinician review of documented attendance to scheduled appointments and patient/caregiver report -Patient/Caregiver will call pharmacy for medication refills as evidenced by patient report and review of pharmacy fill history as appropriate -Patient/Caregiver will call provider office for new concerns or questions as evidenced by review of documented incoming telephone call notes and patient report -Patient/Caregiver will focus on medication adherence by taking medications as prescribed  -Checks BP and records as discussed -check blood sugar at prescribed times -record values and write them down take them to all doctor visits   -Take the medications prescribed to control your heart rate and rhythm and reduce your risk of blood clot formation (anticoagulants or antiplatelet medications/"blood thinners"). -Learn to check your pulse and write down the number every day.  -Learn what we discussed today about AFIB.  Don't get overwhelmed.  Read the  information that I sent you about AFIB.  At our next talk we will discuss Hypertension.         SDOH assessments and interventions completed:  No     Care Coordination Interventions:  Yes, provided   Interventions Today    Flowsheet Row Most Recent Value  Chronic Disease   Chronic disease during today's visit Atrial Fibrillation (AFib)  [Discussed estaying calm and relaaxation, also asking questions that she feels arenot being with answered.]  General Interventions   General Interventions Discussed/Reviewed General Interventions Discussed, General Interventions Reviewed  Mental Health Interventions   Mental Health Discussed/Reviewed Mental Health Discussed, Anxiety  [the ptient has high anxiety , I advised her to call her counselor an psychatrist about medications and anxiety.]  Pharmacy Interventions   Pharmacy Dicussed/Reviewed Pharmacy Topics Discussed, Pharmacy Topics Reviewed  [Patient wants to talk with pharmacist about her medications .]  Safety Interventions   Safety Discussed/Reviewed Safety Discussed        Follow up plan: Follow up call scheduled for 12/19/22  1130 am    Encounter Outcome:  Pt. Visit Completed   Juanell Fairly RN, BSN, [facility] Care Coordinator [facility]   Phone: [phone]

## 2022-11-21 NOTE — Telephone Encounter (Signed)
The refill has been ordered as requested. Please contact the patient to schedule a follow-up visit.

## 2022-11-21 NOTE — Telephone Encounter (Signed)
LVM requesting a return call.

## 2022-11-21 NOTE — Telephone Encounter (Signed)
Pt returned call and I informed her of below.  Only one of the medications she had a question about was prescribed by psych.  She will talk further about this when Lequita Halt gets back she said.

## 2022-11-21 NOTE — Telephone Encounter (Signed)
It looks like the patient has her psychiatric medications prescribed by a psychiatrist, Dr. Vanetta Shawl.  That would be the person with whom she needs to discuss adjusting psychiatric medications.  Yes it is fine if she gets her labs drawn somewhere else.

## 2022-11-23 ENCOUNTER — Telehealth: Payer: Medicare HMO | Admitting: Psychiatry

## 2022-11-23 NOTE — Progress Notes (Unsigned)
Virtual Visit via Video Note  I connected with Lisa Gutierrez on 11/27/22 at  9:30 AM EDT by a video enabled telemedicine application and verified that I am speaking with the correct person using two identifiers.  Location: Patient: home Provider: office Persons participated in the visit- patient, provider    I discussed the limitations of evaluation and management by telemedicine and the availability of in person appointments. The patient expressed understanding and agreed to proceed.    I discussed the assessment and treatment plan with the patient. The patient was provided an opportunity to ask questions and all were answered. The patient agreed with the plan and demonstrated an understanding of the instructions.   The patient was advised to call back or seek an in-person evaluation if the symptoms worsen or if the condition fails to improve as anticipated.  I provided 20 minutes of non-face-to-face time during this encounter.   [name], MD    [facility] MD/PA/NP OP Progress Note  11/27/2022 10:03 AM Lisa Gutierrez  MRN:  [id number]  Chief Complaint:  Chief Complaint  Patient presents with   Follow-up   HPI:  - she is not seen since March.  According to the chart review, she was admitted due to newly found A-fib with RVR.  This is a follow-up appointment for depression, anxiety.  She states that she is not doing well.  She was admitted due to A-fib.  She tends to feel fatigue, does not care about anything when she has a fear.  When her heart is good, she has good energy and denies feeling depressed.  She wanted to ensure about her medication.  She was prescribed bupropion by her primary care, and she thinks it is too much for her to take 3 psychotropics.  Although she denies any side effect, she has not taken bupropion or BuSpar since she has been discharged.  She continues to take care of her dogs and cats, and she enjoys doing it.  She tends to be hateful and mean  later in the day. She has insomnia. She also reports nightmares, and tends to be worried that something is going to happen at night. She denies SI. After having discussion, she is to wait for further evaluation/treatment by her cardiologist while continuing on her current medication regimen.    Wt Readings from Last 3 Encounters:  11/20/22 231 lb 12.8 oz (105.1 kg)  10/04/22 236 lb 9.6 oz (107.3 kg)  09/25/22 240 lb (108.9 kg)     Daily routine: takes care of her dogs, cats, house chores Exercise: unable to do due to knee pain Employment:  Support: none (not communicating with her oldest granddaughter, who left for college as much anymore) Household: husband Marital status: married for 52 years  Number of children: 2 sons.    Visit Diagnosis:    ICD-10-CM   1. MDD (major depressive disorder), recurrent episode, mild (HCC)  F33.0     2. Anxiety  F41.9       Past Psychiatric History: Please see initial evaluation for full details. I have reviewed the history. No updates at this time.     Past Medical History:  Past Medical History:  Diagnosis Date   Anxiety    Atrial fibrillation (HCC)    Depression    Depression    Phreesia 07/02/2020   Diabetes mellitus without complication (HCC)    GERD (gastroesophageal reflux disease)    Hypertension    IBS (irritable bowel syndrome)  Neuromuscular disorder [facility])     Past Surgical History:  Procedure Laterality Date   BREAST EXCISIONAL BIOPSY     CARDIOVERSION N/A 09/03/2022   Procedure: CARDIOVERSION;  Surgeon: Iran Ouch, MD;  Location: [facility] ORS;  Service: Cardiovascular;  Laterality: N/A;   CATARACT EXTRACTION W/PHACO Right 07/24/2022   Procedure: CATARACT EXTRACTION PHACO AND INTRAOCULAR LENS PLACEMENT (IOC) RIGHT DIABETIC  5.93  00:42.5;  Surgeon: Galen Manila, MD;  Location: [facility];  Service: Ophthalmology;  Laterality: Right;   CATARACT EXTRACTION W/PHACO Left 08/07/2022   Procedure: CATARACT  EXTRACTION PHACO AND INTRAOCULAR LENS PLACEMENT (IOC) LEFT DIABETIC  12.33  00:58.3;  Surgeon: Galen Manila, MD;  Location: [facility];  Service: Ophthalmology;  Laterality: Left;   CESAREAN SECTION N/A    Phreesia 07/02/2020   CHOLECYSTECTOMY     EXCISION / BIOPSY BREAST / NIPPLE / DUCT Right 1973   duct removed   SPINE SURGERY N/A    Phreesia 07/02/2020   TUBAL LIGATION N/A    Phreesia 07/02/2020    Family Psychiatric History: Please see initial evaluation for full details. I have reviewed the history. No updates at this time.     Family History:  Family History  Problem Relation Age of Onset   Diabetes Mother    Hypertension Mother    Coronary artery disease Father    Glaucoma Father    Breast cancer Neg Hx     Social History:  Social History   Socioeconomic History   Marital status: Married    Spouse name: Not on file   Number of children: Not on file   Years of education: Not on file   Highest education level: Not on file  Occupational History   Not on file  Tobacco Use   Smoking status: Former   Smokeless tobacco: Never  Vaping Use   Vaping status: Never Used  Substance and Sexual Activity   Alcohol use: No    Alcohol/week: 0.0 standard drinks of alcohol   Drug use: Never   Sexual activity: Not Currently    Partners: Male  Other Topics Concern   Not on file  Social History Narrative   Not on file   Social Determinants of Health   Financial Resource Strain: Low Risk  (10/15/2022)   Overall Financial Resource Strain (CARDIA)    Difficulty of Paying Living Expenses: Not very hard  Food Insecurity: No Food Insecurity (10/15/2022)   Hunger Vital Sign    Worried About Running Out of Food in the Last Year: Never true    Ran Out of Food in the Last Year: Never true  Transportation Needs: No Transportation Needs (08/09/2022)   PRAPARE - Administrator, Civil Service (Medical): No    Lack of Transportation (Non-Medical): No  Physical  Activity: Inactive (08/09/2022)   Exercise Vital Sign    Days of Exercise per Week: 0 days    Minutes of Exercise per Session: 0 min  Stress: Stress Concern Present (10/15/2022)   [employer] of Occupational Health - Occupational Stress Questionnaire    Feeling of Stress : Rather much  Social Connections: Socially Integrated (08/09/2022)   Social Connection and Isolation Panel [NHANES]    Frequency of Communication with Friends and Family: More than three times a week    Frequency of Social Gatherings with Friends and Family: More than three times a week    Attends Religious Services: More than 4 times per year    Active Member of  Clubs or Organizations: Yes    Attends Engineer, structural: More than 4 times per year    Marital Status: Married    Allergies:  Allergies  Allergen Reactions   Aspirin Other (See Comments)    GI upset   Ibuprofen Other (See Comments)    GERD   Levofloxacin Other (See Comments)    Weight gain   Meloxicam Other (See Comments)    GI upset   Morphine Other (See Comments)   Naprosyn  [Naproxen] Other (See Comments)    GI upset   Nsaids Other (See Comments)    GI upset   Propofol Other (See Comments)    Had horrible nightmares   Quinolones    Robinul  [Glycopyrrolate] Nausea And Vomiting   Penicillin V Potassium Rash   Sulfa Antibiotics Rash    Metabolic Disorder Labs: Lab Results  Component Value Date   HGBA1C 7.3 09/25/2022   MPG 169 07/10/2022   No results found for: "PROLACTIN" Lab Results  Component Value Date   CHOL 195 08/24/2021   TRIG 233 (H) 08/24/2021   HDL 31 (L) 08/24/2021   CHOLHDL 6.3 (H) 08/24/2021   LDLCALC 123 (H) 08/24/2021   Lab Results  Component Value Date   TSH 0.908 07/10/2022   TSH 1.040 08/24/2021    Therapeutic Level Labs: No results found for: "LITHIUM" No results found for: "VALPROATE" No results found for: "CBMZ"  Current Medications: Current Outpatient Medications  Medication Sig  Dispense Refill   acetaminophen (TYLENOL) 500 MG tablet Take 2 tablets by mouth as needed.     albuterol (VENTOLIN HFA) 108 (90 Base) MCG/ACT inhaler Inhale 2 puffs into the lungs every 6 (six) hours as needed for wheezing or shortness of breath. 18 g 2   ALPRAZolam (XANAX) 0.5 MG tablet TAKE 1 TABLET (0.5 MG TOTAL) BY MOUTH AT BEDTIME AS NEEDED FOR ANXIETY OR SLEEP. 30 tablet 2   apixaban (ELIQUIS) 5 MG TABS tablet Take 1 tablet (5 mg total) by mouth 2 (two) times daily. 60 tablet 5   Bioflavonoid Products (VITAMIN C) CHEW Chew by mouth daily. With vitamin D and elderberry. 2 chews daily     Blood Glucose Monitoring Suppl DEVI Use 3 times daily for blood glucose monitoring. E11.65 1 each 0   buPROPion (WELLBUTRIN SR) 150 MG 12 hr tablet Take 1 tablet (150 mg total) by mouth every morning. (Patient not taking: Reported on 11/27/2022) 90 tablet 0   busPIRone (BUSPAR) 5 MG tablet Take 1 tablet (5 mg total) by mouth 2 (two) times daily. (Patient not taking: Reported on 11/27/2022) 180 tablet 0   Calcium Carbonate (CALCIUM 500 PO) Take 500 mg by mouth 2 (two) times daily.     clobetasol ointment (TEMOVATE) 0.05 % Apply topically 2 (two) times daily. Apply topically to affected twice daily until improved. 30 g 2   digoxin (LANOXIN) 0.25 MG tablet TAKE 1 TABLET BY MOUTH EVERY DAY 30 tablet 1   empagliflozin (JARDIANCE) 25 MG TABS tablet Take 1 tablet (25 mg total) by mouth daily before breakfast. 90 tablet 1   Flaxseed, Linseed, (FLAXSEED OIL PO) Take 1,500 mg by mouth 2 (two) times daily.     furosemide (LASIX) 20 MG tablet TAKE 1 TABLET BY MOUTH EVERY DAY 30 tablet 1   Glucose Blood (BLOOD GLUCOSE TEST STRIPS) STRP Blood sugar testing 2 to 3 times daily. E11.65 100 strip 3   JANUMET 50-1000 MG tablet Take 1 tablet by mouth 2 (two) times  daily with a meal. 180 tablet 1   Lancet Device MISC Use for blood sugar testing 2 to 3 times daily. E11.65 1 each 0   LINZESS 145 MCG CAPS capsule TAKE 1 CAPSULE BY  MOUTH EVERY DAY 30 capsule 2   losartan (COZAAR) 25 MG tablet Take 1 tablet (25 mg total) by mouth daily. 90 tablet 3   meclizine (ANTIVERT) 12.5 MG tablet Take 1 tablet (12.5 mg total) by mouth 3 (three) times daily as needed for dizziness. 30 tablet 0   metoprolol tartrate (LOPRESSOR) 50 MG tablet TAKE 1.5 TABLETS BY MOUTH 2 TIMES DAILY. 90 tablet 0   Multiple Vitamins-Minerals (MULTIVITAMIN ADULT PO) Take 1 tablet by mouth daily.     pantoprazole (PROTONIX) 40 MG tablet TAKE 1 TABLET BY MOUTH TWICE A DAY 180 tablet 1   venlafaxine XR (EFFEXOR-XR) 150 MG 24 hr capsule Take 1 capsule (150 mg total) by mouth daily with breakfast. 90 capsule 0   venlafaxine XR (EFFEXOR-XR) 37.5 MG 24 hr capsule Take 1 capsule (37.5 mg total) by mouth daily with breakfast. 90 capsule 0   No current facility-administered medications for this visit.     Musculoskeletal: Strength & Muscle Tone:  N/A Gait & Station:  N/A Patient leans: N/A  Psychiatric Specialty Exam: Review of Systems  Psychiatric/Behavioral:  Positive for dysphoric mood and sleep disturbance. Negative for agitation, behavioral problems, confusion, decreased concentration, hallucinations, self-injury and suicidal ideas. The patient is nervous/anxious. The patient is not hyperactive.   All other systems reviewed and are negative.   There were no vitals taken for this visit.There is no height or weight on file to calculate BMI.  General Appearance: Fairly Groomed  Eye Contact:  Good  Speech:  Clear and Coherent  Volume:  Normal  Mood:   tired  Affect:  Appropriate, Congruent, and calm  Thought Process:  Coherent  Orientation:  Full (Time, Place, and Person)  Thought Content: Logical   Suicidal Thoughts:  No  Homicidal Thoughts:  No  Memory:  Immediate;   Good  Judgement:  Good  Insight:  Good  Psychomotor Activity:  Normal  Concentration:  Concentration: Good and Attention Span: Good  Recall:  Good  Fund of Knowledge: Good   Language: Good  Akathisia:  No  Handed:  Right  AIMS (if indicated): not done  Assets:  Communication Skills Desire for Improvement  ADL's:  Intact  Cognition: WNL  Sleep:  Poor   Screenings: GAD-7    Flowsheet Row Video Visit from 11/08/2022 in [facility] at [facility] Visit from 09/25/2022 in [facility] Primary Care at [facility] Erroneous Encounter from 07/18/2022 in [facility] Care at [facility] Video Visit from 01/01/2022 in [facility] Primary Care at [facility] Clinical Support from 08/24/2021 in [facility] Primary Care at [facility]  Total GAD-7 Score 15 12 13 13 8       Mini-Mental    Flowsheet Row Clinical Support from 02/12/2020 in [facility] Clinical Support from 02/06/2018 in [facility], [facility]  Total Score (max 30 points ) 29 30      PHQ2-9    Flowsheet Row Video Visit from 11/08/2022 in [facility] Primary Care at [facility] Coordination from 10/15/2022 in [facility] Care Coordination Office Visit from 09/25/2022 in [facility] Primary Care at [facility] Clinical Support from 08/09/2022 in [facility] Primary Care at [facility] Erroneous Encounter from 07/18/2022 in [facility] at [facility]  PHQ-2 Total Score 6 5 5  0 1  PHQ-9 Total Score 22 19 19 2 14       Flowsheet Row Admission (Discharged) from 08/07/2022 in [facility] PERIOP Most recent reading at 08/07/2022  7:15 AM Counselor from 07/20/2022 in [facility] at [place] Most recent reading at 07/24/2022  8:33 AM Admission (Discharged) from 07/24/2022 in [facility] PERIOP Most recent reading at 07/24/2022  7:47 AM  C-SSRS RISK CATEGORY No Risk Low Risk No Risk        Assessment and Plan:  Lisa Gutierrez is a 72 y.o. year old female with a history of  depression, hypertension, diabetes, GERD, newly diagnosed Afib with RVR, who  presents for follow up appointment for below.   1. MDD (major depressive disorder), recurrent episode, mild (HCC) 2. Anxiety Acute stressors include: pending cataract surgery, fall  Other stressors include: marital conflict, conflict with her children, financial strain  History:    She continues to experience irritability, depressive symptoms and anxiety, which he associates with newly diagnosed A fib.  Given her mood symptoms can be situational, she agrees to stay on the current medication regimen.  Will continue venlafaxine to target depression and anxiety.  Will hold BuSpar at this time given she has not been taking it for the past few months.  Noted that she also has not taken bupropion, which was started by her primary care for the past few months.  She was advised to stay off this medication to avoid polypharmacy, and possible interaction with metoprolol at this time especially given her complains of fatigue.  # insomnia She complains of middle insomnia, daytime fatigue.  She denies snoring.  Although she will benefit from sleep evaluation, she declined this at this time.  Will continue to assess as needed.    Plan Continue venlafaxine 187.5 mg daily- monitor weight gain Hold buspar, bupropion  Next appointment : 10/16 at 9 30, video.  She declined in person visit due to her physical condition  Past trials of medication: sertraline, citalopram, lexapro, fluoxetine, bupropion (headache)   The patient demonstrates the following risk factors for suicide: Chronic risk factors for suicide include: psychiatric disorder of depression and chronic pain. Acute risk factors for suicide include: family or marital conflict. Protective factors for this patient include: hope for the future. Considering these factors, the overall suicide risk at this point appe  Collaboration of Care: Collaboration of Care: Other reviewed notes in Epic  Patient/Guardian was advised Release of Information must be  obtained prior to any record release in order to collaborate their care with an outside provider. Patient/Guardian was advised if they have not already done so to contact the registration department to sign all necessary forms in order for [place] to release information regarding their care.   Consent: Patient/Guardian gives verbal consent for treatment and assignment of benefits for services provided during this visit. Patient/Guardian expressed understanding and agreed to proceed.    [name], MD 11/27/2022, 10:03 AM

## 2022-11-25 ENCOUNTER — Other Ambulatory Visit: Payer: Self-pay | Admitting: Cardiology

## 2022-11-27 ENCOUNTER — Encounter: Payer: Self-pay | Admitting: Psychiatry

## 2022-11-27 ENCOUNTER — Telehealth: Payer: Medicare HMO | Admitting: Psychiatry

## 2022-11-27 DIAGNOSIS — F419 Anxiety disorder, unspecified: Secondary | ICD-10-CM

## 2022-11-27 DIAGNOSIS — F330 Major depressive disorder, recurrent, mild: Secondary | ICD-10-CM

## 2022-11-27 NOTE — Patient Instructions (Signed)
Continue venlafaxine 187.5 mg daily Hold buspar, bupropion  Next appointment : 10/16 at 9 30

## 2022-11-28 ENCOUNTER — Ambulatory Visit: Payer: Self-pay | Admitting: *Deleted

## 2022-11-28 NOTE — Patient Instructions (Signed)
Visit Information  Thank you for taking time to visit with me today. Please don't hesitate to contact me if I can be of assistance to you.   Following are the goals we discussed today:   Goals Addressed             This Visit's Progress    Reduce symptoms related to depression and  anxiety       Activities and task to complete in order to accomplish goals.   Review and consider the additional support resources mailed to you Call to inquire about your recent/previous counselor plans to connect you with new counselor Find ways to relax and reduce your anxiety- email with material being sent today Find ways to remove yourself from conversations that are upsetting  Call your insurance provider for more information about your Enhanced Benefits ( ) Start / continue relaxed breathing 3 times daily Keep all upcoming appointment discussed today Continue with compliance of taking medication prescribed by Doctor Consider plans for on-going plans for behavioral health care as discussed Call 988 for mental health support/counselor 24/7   Call 911 for emergent need Consider local Hospice agency for free grief counseling and support        Our next appointment is by telephone on 12/11/22    Please call the care guide team at [phone] if you need to cancel or reschedule your appointment.   If you are experiencing a Mental Health or Behavioral Health Crisis or need someone to talk to, please call the Suicide and Crisis Lifeline: 988 call 911   The patient verbalized understanding of instructions, educational materials, and care plan provided today and DECLINED offer to receive copy of patient instructions, educational materials, and care plan.   Telephone follow up appointment with care management team member scheduled for: 12/11/22  Reece Levy, MSW, LCSW Clinical Social Worker [facility] [phone]

## 2022-11-28 NOTE — Patient Outreach (Signed)
  Care Coordination   Follow Up Visit Note   11/28/2022 Name: Lisa Gutierrez MRN: [id number] DOB: 04-12-1951  Lisa Gutierrez is a 72 y.o. year old female who sees [name], [place] for primary care. I spoke with  [name] by phone today.  What matters to the patients health and wellness today?  Pt remains anxious, upset and with depression.    Goals Addressed             This Visit's Progress    Reduce symptoms related to depression and  anxiety       Activities and task to complete in order to accomplish goals.   Review and consider the additional support resources mailed to you Call to inquire about your recent/previous counselor plans to connect you with new counselor Find ways to relax and reduce your anxiety- email with material being sent today Find ways to remove yourself from conversations that are upsetting  Call your insurance provider for more information about your Enhanced Benefits ( ) Start / continue relaxed breathing 3 times daily Keep all upcoming appointment discussed today Continue with compliance of taking medication prescribed by Doctor Consider plans for on-going plans for behavioral health care as discussed Call 988 for mental health support/counselor 24/7   Call 911 for emergent need Consider local Hospice agency for free grief counseling and support        SDOH assessments and interventions completed:  Yes     Care Coordination Interventions:  Yes, provided  Interventions Today    Flowsheet Row Most Recent Value  Chronic Disease   Chronic disease during today's visit Other, Atrial Fibrillation (AFib)  [Pt reports her heart is out of rhythm and seeing Dr Lalla Brothers 01/02/23]  Education Interventions   Education Provided Provided Education  Provided Verbal Education On Mental Health/Coping with Illness, Medication, [employer], Development worker, community  Mental Health Interventions   Mental Health Discussed/Reviewed  Grief and Loss, Coping Strategies, Mental Health Reviewed, Mental Health Discussed, Anxiety  [Pt with some grief and loss after "mother died in my arms" and her best friend passing away a few months  ago]  Pharmacy Interventions   Pharmacy Dicussed/Reviewed Pharmacy Topics Discussed  [Pt wants to talk to someone to better understand all her RX, answer questions, etc.  Will ask RNCM and Pharmacy for follow up.]       Follow up plan: Follow up call scheduled for 12/11/22    Encounter Outcome:  Pt. Visit Completed

## 2022-11-30 ENCOUNTER — Telehealth: Payer: Self-pay

## 2022-11-30 NOTE — Progress Notes (Signed)
   11/30/2022  Patient ID: Lisa Gutierrez, female   DOB: 11/06/1950, 72 y.o.   MRN: [id number]  Received a clinic routed request to review patient's medications with her due to some questions/concerns that she has.  I attempted to call to schedule a telephone visit to do so, but I was not able to reach her.  HIPAA compliant voicemail was left with my direct phone number, and I am also sending a MyChart message.  Lenna Gilford, PharmD, DPLA

## 2022-11-30 NOTE — Progress Notes (Signed)
   11/30/2022  Patient ID: Lisa Gutierrez, female   DOB: March 15, 1951, 72 y.o.   MRN: [id number]  Patient returning my call, and she is scheduled for a telephone visit to review medications Monday 8/19 at 4:30pm.  Lenna Gilford, PharmD, DPLA

## 2022-12-02 ENCOUNTER — Encounter: Payer: Self-pay | Admitting: Family Medicine

## 2022-12-03 ENCOUNTER — Other Ambulatory Visit: Payer: Medicare HMO

## 2022-12-04 ENCOUNTER — Other Ambulatory Visit: Payer: Self-pay | Admitting: Family Medicine

## 2022-12-04 DIAGNOSIS — F3341 Major depressive disorder, recurrent, in partial remission: Secondary | ICD-10-CM | POA: Diagnosis not present

## 2022-12-04 DIAGNOSIS — F411 Generalized anxiety disorder: Secondary | ICD-10-CM | POA: Diagnosis not present

## 2022-12-04 DIAGNOSIS — E1165 Type 2 diabetes mellitus with hyperglycemia: Secondary | ICD-10-CM | POA: Diagnosis not present

## 2022-12-04 LAB — CBC: RBC: 4.88 (ref 3.87–5.11)

## 2022-12-04 LAB — CBC AND DIFFERENTIAL
HCT: 44 (ref 36–46)
Hemoglobin: 14.1 (ref 12.0–16.0)
Neutrophils Absolute: 8.5
Platelets: 263 10*3/uL (ref 150–400)
WBC: 11.6

## 2022-12-04 LAB — HEPATIC FUNCTION PANEL
ALT: 44 U/L — AB (ref 7–35)
AST: 47 — AB (ref 13–35)
Bilirubin, Total: 0.2

## 2022-12-04 LAB — BASIC METABOLIC PANEL
BUN: 19 (ref 4–21)
CO2: 21 (ref 13–22)
Chloride: 98 — AB (ref 99–108)
Creatinine: 0.7 (ref 0.5–1.1)
Glucose: 191
Potassium: 4.4 mEq/L (ref 3.5–5.1)
Sodium: 137 (ref 137–147)

## 2022-12-04 LAB — COMPREHENSIVE METABOLIC PANEL
Albumin: 4.1 (ref 3.5–5.0)
Calcium: 10.2 (ref 8.7–10.7)
Globulin: 2.7
eGFR: 93

## 2022-12-05 LAB — COMPREHENSIVE METABOLIC PANEL
ALT: 44 IU/L — ABNORMAL HIGH (ref 0–32)
AST: 47 IU/L — ABNORMAL HIGH (ref 0–40)
Albumin: 4.1 g/dL (ref 3.8–4.8)
Alkaline Phosphatase: 58 IU/L (ref 44–121)
BUN/Creatinine Ratio: 19 (ref 12–28)
BUN: 13 mg/dL (ref 8–27)
Bilirubin Total: 0.2 mg/dL (ref 0.0–1.2)
CO2: 21 mmol/L (ref 20–29)
Calcium: 10.2 mg/dL (ref 8.7–10.3)
Chloride: 98 mmol/L (ref 96–106)
Creatinine, Ser: 0.69 mg/dL (ref 0.57–1.00)
Globulin, Total: 2.7 g/dL (ref 1.5–4.5)
Glucose: 191 mg/dL — ABNORMAL HIGH (ref 70–99)
Potassium: 4.4 mmol/L (ref 3.5–5.2)
Sodium: 137 mmol/L (ref 134–144)
Total Protein: 6.8 g/dL (ref 6.0–8.5)
eGFR: 93 mL/min/{1.73_m2} (ref >59)

## 2022-12-05 LAB — CBC WITH DIFFERENTIAL/PLATELET
Basophils Absolute: 0.1 10*3/uL (ref 0.0–0.2)
Basos: 0 %
EOS (ABSOLUTE): 0 10*3/uL (ref 0.0–0.4)
Eos: 0 %
Hematocrit: 43.5 % (ref 34.0–46.6)
Hemoglobin: 14.1 g/dL (ref 11.1–15.9)
Immature Grans (Abs): 0.1 10*3/uL (ref 0.0–0.1)
Immature Granulocytes: 1 %
Lymphocytes Absolute: 1.9 10*3/uL (ref 0.7–3.1)
Lymphs: 16 %
MCH: 28.9 pg (ref 26.6–33.0)
MCHC: 32.4 g/dL (ref 31.5–35.7)
MCV: 89 fL (ref 79–97)
Monocytes Absolute: 1.1 10*3/uL — ABNORMAL HIGH (ref 0.1–0.9)
Monocytes: 9 %
Neutrophils Absolute: 8.5 10*3/uL — ABNORMAL HIGH (ref 1.4–7.0)
Neutrophils: 74 %
Platelets: 263 10*3/uL (ref 150–450)
RBC: 4.88 x10E6/uL (ref 3.77–5.28)
RDW: 13.2 % (ref 11.7–15.4)
WBC: 11.6 10*3/uL — ABNORMAL HIGH (ref 3.4–10.8)

## 2022-12-07 ENCOUNTER — Encounter: Payer: Self-pay | Admitting: Family Medicine

## 2022-12-08 ENCOUNTER — Other Ambulatory Visit: Payer: Self-pay | Admitting: Cardiology

## 2022-12-09 NOTE — Progress Notes (Signed)
12/09/2022 Name: Lisa Gutierrez MRN: [id number] DOB: 11-03-1950  Chief Complaint  Patient presents with   Medication Management   Lisa Gutierrez is a 72 y.o. year old female who presented for a telephone visit.   They were referred to the pharmacist by  Vincent Gros, NP with Family Medicine  for assistance in managing complex medication management.   Subjective:  Care Team: Primary Care Provider: [name], PA ; Next Scheduled Visit: 12/27/22 Cardio EP consult scheduled 01/02/23  Medication Access/Adherence  Current Pharmacy:  [id number] [place], [place] - [address] [place] [address] Phone: [phone] Fax: [phone]  [facility] - [place], [place] - [address], [address], [address] [place] [address] Phone: [phone] Fax: [phone]  [facility] - [place], [place] - [address] [place] [address] Phone: [phone] Fax: [phone]  -Patient reports affordability concerns with their medications: Yes- currently working on getting patient assistance for Eliquis, but she still lacks approximately $150 on OOP spending for the year to qualify for the program.  Currently has PAP for Linzess, Jardiance, and Janumet. -Patient reports access/transportation concerns to their pharmacy: No  -Patient reports adherence concerns with their medications:  No   -Patient does express concern with current medication regimen:  feeling fatigued with very low energy during the day, but at night she is not able to sleep due to night-time agitation and nightmares.  Believes venlafaxine is making her "feel like a zombie."  Recently had telehealth visit with psychiatry, and they did not want to change medications until she is seen by cards EP.  Diabetes: Current medications: Jardiance 25mg  daily, Janumet 50/1000mg  BID -Current glucose readings: FBG 160-170 -Patient  is using Accu-Check Guide meter and supplies -Patient denies hypoglycemic s/sx including dizziness, shakiness, sweating.  -Patient denies hyperglycemic symptoms including polyuria, polydipsia, polyphagia, nocturia, neuropathy, blurred vision. -Current medication access support: PAP for Jardiance and Janumet  Hypertension: Current medications: furosemide 20mg  daily as needed, losartan 25mg  daily, metoprolol tartrate 75mg  BID -Patient does not check home BP regularly; last OV with [facility] on 11/20/22 BP was 148/78 -Patient denies hypotensive s/sx including dizziness, lightheadedness.  -Patient denies hypertensive symptoms including headache, chest pain, shortness of breath  Hyperlipidemia/ASCVD Risk Reduction Current lipid lowering medications: None- documented statin intolerance Antiplatelet regimen: None- GI intolerance to ASA -Patient has A-fib diagnosis and is taking Eliquis 5mg  BID and digoxin 0.25mg  daily   Objective: Lab Results  Component Value Date   HGBA1C 7.3 09/25/2022   Lab Results  Component Value Date   CREATININE 0.7 12/04/2022   BUN 19 12/04/2022   NA 137 12/04/2022   K 4.4 12/04/2022   CL 98 (A) 12/04/2022   CO2 21 12/04/2022   Lab Results  Component Value Date   CHOL 195 08/24/2021   HDL 31 (L) 08/24/2021   LDLCALC 123 (H) 08/24/2021   TRIG 233 (H) 08/24/2021   CHOLHDL 6.3 (H) 08/24/2021   Medications Reviewed Today     Reviewed by Lenna Gilford, RPH (Pharmacist) on 12/07/22 at 1744  Med List Status: <None>   Medication Order Taking? Sig Documenting Provider Last Dose Status Informant  acetaminophen (TYLENOL) 500 MG tablet [id number] Yes Take 2 tablets by mouth as needed. [provider] Taking Active Self, Pharmacy Records           Med Note Garwin Brothers Jul 10, 2022  5:44  PM)    albuterol (VENTOLIN HFA) 108 (90 Base) MCG/ACT inhaler [id number] No Inhale 2 puffs into the lungs every 6 (six) hours as needed for wheezing or  shortness of breath.  Patient not taking: Reported on 12/03/2022   Carlean Jews, NP Not Taking Active   ALPRAZolam Prudy Feeler) 0.5 MG tablet [id number] Yes TAKE 1 TABLET (0.5 MG TOTAL) BY MOUTH AT BEDTIME AS NEEDED FOR ANXIETY OR SLEEP. Carlean Jews, NP Taking Active            Med Note Littie Deeds, Wylene Weissman A   Mon Dec 03, 2022  4:41 PM) Takes one-half to 1 tablet as needed  apixaban (ELIQUIS) 5 MG TABS tablet [id number] Yes Take 1 tablet (5 mg total) by mouth 2 (two) times daily. Iran Ouch, MD Taking Active   Bioflavonoid Products (VITAMIN C) CHEW [id number] Yes Chew by mouth daily. With vitamin D and elderberry. 2 chews daily [provider] Taking Active Self, Pharmacy Records  Calcium Carbonate (CALCIUM 500 PO) [id number] Yes Take 500 mg by mouth 2 (two) times daily. [provider] Taking Active Self, Pharmacy Records  clobetasol ointment (TEMOVATE) 0.05 % [id number] Yes Apply topically 2 (two) times daily. Apply topically to affected twice daily until improved. Carlean Jews, NP Taking Active            Med Note Sharlee Blew Dec 03, 2022  4:43 PM) When needed  digoxin (LANOXIN) 0.25 MG tablet [id number] Yes TAKE 1 TABLET BY MOUTH EVERY DAY Hammock, Sheri, NP Taking Active   empagliflozin (JARDIANCE) 25 MG TABS tablet [id number] Yes Take 1 tablet (25 mg total) by mouth daily before breakfast. [name], PA Taking Active            Med Note Littie Deeds, Debbie Bellucci A   Mon Dec 03, 2022  4:44 PM)    Flaxseed, Linseed, (FLAXSEED OIL PO) [id number] Yes Take 1,500 mg by mouth 2 (two) times daily. [provider] Taking Active Self, Pharmacy Records  furosemide (LASIX) 20 MG tablet [id number] Yes TAKE 1 TABLET BY MOUTH EVERY DAY Hammock, Sheri, NP Taking Active            Med Note Littie Deeds, Danita Proud A   Mon Dec 03, 2022  4:45 PM) If needed for swelling/fuild retention  JANUMET 50-1000 MG tablet [id number] Yes Take 1 tablet by mouth 2 (two) times daily with a  meal. [name], PA Taking Active   LINZESS 145 MCG CAPS capsule [id number] Yes TAKE 1 CAPSULE BY MOUTH EVERY DAY Boscia, Kathlynn Grate, NP Taking Active Self, Pharmacy Records  losartan (COZAAR) 25 MG tablet [id number] Yes Take 1 tablet (25 mg total) by mouth daily. Charlsie Quest, NP Taking Expired 12/03/22 2359   metoprolol tartrate (LOPRESSOR) 50 MG tablet [id number] Yes TAKE 1.5 TABLETS BY MOUTH 2 TIMES DAILY. Charlsie Quest, NP Taking Active   Multiple Vitamins-Minerals (MULTIVITAMIN ADULT PO) [id number] Yes Take 1 tablet by mouth daily. [provider] Taking Active Self, Pharmacy Records  pantoprazole (PROTONIX) 40 MG tablet [id number] Yes TAKE 1 TABLET BY MOUTH TWICE A DAY Boscia, Heather E, NP Taking Active   venlafaxine XR (EFFEXOR-XR) 150 MG 24 hr capsule [id number] Yes Take 1 capsule (150 mg total) by mouth daily with breakfast. [name], PA Taking Active   venlafaxine XR (EFFEXOR-XR) 37.5 MG 24 hr capsule [id number] Yes Take 1 capsule (37.5 mg total) by mouth daily with breakfast. [name], PA Taking Active  Assessment/Plan:   Medication Access/Adherence -Educated patient on use, benefit, and potential side effects of medications, particularly those prescribed for A-fib and her venlafaxine -Likely that her A-fib diagnosis as well as some of the medications used to control this are causing lethargy and fatige.  Informed her that at cards EP visit they will likely discuss current medications and alternative treatments (cardiac ablation).  Continue current regimen at this time until seen by cards EP. -Common side effects of venlafaxine include abnormal dreams, anxiety, and insomnia. I recommend that patient continue therapy at this time until seen by cards EP and then f/u with psychiatry.  Diabetes: - Currently uncontrolled with recent A1c >7% - Hesitant to recommend change to pharmacotherapy at this time, especially since A1c close to goal -  Recommend lifestyle modifications at this time, including decreased carbohydrate and sugar intake and increased exercise  Hypertension: - Currently moderately controlled - Continue current regimen at this time; if BP consistently >130/80, could increase losartan to 50mg  daily   Hyperlipidemia/ASCVD Risk Reduction: - Currently uncontrolled.  - Recommend consideration of ezetimibe 10mg  daily and/or PCSK9-I based on elevated LDL and TG and low HDL  Follow Up Plan: None scheduled but can as needed per PCP  Lenna Gilford, PharmD, DPLA

## 2022-12-11 ENCOUNTER — Encounter (INDEPENDENT_AMBULATORY_CARE_PROVIDER_SITE_OTHER): Payer: Medicare HMO | Admitting: Family Medicine

## 2022-12-11 ENCOUNTER — Ambulatory Visit: Payer: Self-pay | Admitting: *Deleted

## 2022-12-11 DIAGNOSIS — Z7984 Long term (current) use of oral hypoglycemic drugs: Secondary | ICD-10-CM | POA: Diagnosis not present

## 2022-12-11 DIAGNOSIS — E1165 Type 2 diabetes mellitus with hyperglycemia: Secondary | ICD-10-CM

## 2022-12-11 DIAGNOSIS — D72829 Elevated white blood cell count, unspecified: Secondary | ICD-10-CM | POA: Diagnosis not present

## 2022-12-12 NOTE — Patient Instructions (Signed)
Visit Information  Thank you for taking time to visit with me today. Please don't hesitate to contact me if I can be of assistance to you.   Following are the goals we discussed today:   Goals Addressed             This Visit's Progress    Reduce symptoms related to depression and  anxiety       Activities and task to complete in order to accomplish goals.   Review and consider the additional support resources mailed to you Call to inquire about your recent/previous counselor plans to connect you with new counselor Find ways to relax and reduce your anxiety- email with material being sent today Find ways to remove yourself from conversations that are upsetting  Call your insurance provider for more information about your Enhanced Benefits ( ) Start / continue relaxed breathing 3 times daily Keep all upcoming appointment discussed today Continue with compliance of taking medication prescribed by Doctor Consider plans for on-going plans for behavioral health care as discussed Call 988 for mental health support/counselor 24/7   Call 911 for emergent need Consider local Hospice agency for free grief counseling and support         Our next appointment is by telephone on 12/19/22    Please call the care guide team at [phone] if you need to cancel or reschedule your appointment.   If you are experiencing a Mental Health or Behavioral Health Crisis or need someone to talk to, please call the Suicide and Crisis Lifeline: 988 call 911   The patient verbalized understanding of instructions, educational materials, and care plan provided today and DECLINED offer to receive copy of patient instructions, educational materials, and care plan.   Telephone follow up appointment with care management team member scheduled for: 12/19/22 Reece Levy, MSW, LCSW Clinical Social Worker [phone]

## 2022-12-12 NOTE — Patient Outreach (Signed)
  Care Coordination   Follow Up Visit Note   12/12/2022 Name: Lisa Gutierrez MRN: [id number] DOB: [birth year]  Lisa Gutierrez is a 72 y.o. year old female who sees Lisa Gutierrez, [place] for primary care. I spoke with  Lisa Gutierrez by phone today.  What matters to the patients health and wellness today?  "Crying a lot, tired, feeling guilty about AFIB because my husband tells me it's my fault".     Goals Addressed             This Visit's Progress    Reduce symptoms related to depression and  anxiety       Activities and task to complete in order to accomplish goals.   Review and consider the additional support resources mailed to you Call to inquire about your recent/previous counselor plans to connect you with new counselor Find ways to relax and reduce your anxiety- email with material being sent today Find ways to remove yourself from conversations that are upsetting  Call your insurance provider for more information about your Enhanced Benefits ( ) Start / continue relaxed breathing 3 times daily Keep all upcoming appointment discussed today Continue with compliance of taking medication prescribed by Doctor Consider plans for on-going plans for behavioral health care as discussed Call 988 for mental health support/counselor 24/7   Call 911 for emergent need Consider local Hospice agency for free grief counseling and support         SDOH assessments and interventions completed:  Yes     Care Coordination Interventions:  Yes, provided  Interventions Today    Flowsheet Row Most Recent Value  Chronic Disease   Chronic disease during today's visit Other, Atrial Fibrillation (AFib)  [Pt continues to express and present with a lot of anxiety and feelings of being overwhelmed, "crying a lot", tired, being blamed by her husband for her medical condition (afib) and an overall feeling of apathy.]  Education Interventions   Provided Verbal Education On  Mental Health/Coping with Illness, Insurance Plans  [Pt wants to get connected with new Counselor since her previous one left the practice,  however, pt indicates she cannot afford her copay for sessions. CSW seeking alternative options (not certain there are any free options) and will advise pt.]  Mental Health Interventions   Mental Health Discussed/Reviewed Mental Health Discussed, Mental Health Reviewed, Coping Strategies, Anxiety  [CSW allowed pt to express her feelings of being overwhelmed, upset with husband's lack of support and accusations, etc. Encouraged her to remove herself from his presence when needed.]  Safety Interventions   Safety Discussed/Reviewed Safety Discussed  [Pt provided with # to call for 24/7 mental health support]       Follow up plan: Follow up call scheduled for 12/19/22    Encounter Outcome:  Pt. Visit Completed

## 2022-12-19 ENCOUNTER — Telehealth: Payer: Self-pay | Admitting: *Deleted

## 2022-12-19 ENCOUNTER — Encounter: Payer: Self-pay | Admitting: *Deleted

## 2022-12-19 NOTE — Progress Notes (Signed)
  Care Coordination Note  12/19/2022 Name: Oluwatamilore Martello MRN: [id number] DOB: 1950/09/05  Quaniyah Ladd is a 72 y.o. year old female who is a primary care patient of [name], [place] and is actively engaged with the care management team.  [name] contacted by phone today to assist with  canceling  a follow up visit with the RN Case Manager and Licensed Clinical Social Worker  Follow up plan: Patient will call back to reschedule   [facility] Coordination Care Guide  Direct Dial: [phone]

## 2022-12-25 NOTE — Progress Notes (Signed)
  Care Coordination Note  12/25/2022 Name: [name] MRN: [id number] DOB: 1951-03-03  Lisa Gutierrez is a 72 y.o. year old female who is a primary care patient of [name], [place] and is actively engaged with the care management team. I reached out to [name] by phone today to assist with re-scheduling a follow up visit with the RN Case Manager and Licensed Clinical Social Worker  Follow up plan: Telephone appointment with care management team member scheduled for:9/23 [place] and RNCM 10/2  [facility] Guide  Direct Dial: [phone]

## 2022-12-26 ENCOUNTER — Other Ambulatory Visit: Payer: Self-pay | Admitting: Cardiology

## 2022-12-27 ENCOUNTER — Ambulatory Visit (INDEPENDENT_AMBULATORY_CARE_PROVIDER_SITE_OTHER): Payer: Medicare HMO | Admitting: Family Medicine

## 2022-12-27 ENCOUNTER — Encounter: Payer: Self-pay | Admitting: Family Medicine

## 2022-12-27 VITALS — BP 123/85 | HR 83 | Resp 18 | Ht 66.0 in | Wt 227.0 lb

## 2022-12-27 DIAGNOSIS — E66812 Obesity, class 2: Secondary | ICD-10-CM

## 2022-12-27 DIAGNOSIS — Z7984 Long term (current) use of oral hypoglycemic drugs: Secondary | ICD-10-CM | POA: Diagnosis not present

## 2022-12-27 DIAGNOSIS — F411 Generalized anxiety disorder: Secondary | ICD-10-CM

## 2022-12-27 DIAGNOSIS — E1159 Type 2 diabetes mellitus with other circulatory complications: Secondary | ICD-10-CM

## 2022-12-27 DIAGNOSIS — E785 Hyperlipidemia, unspecified: Secondary | ICD-10-CM

## 2022-12-27 DIAGNOSIS — R6881 Early satiety: Secondary | ICD-10-CM

## 2022-12-27 DIAGNOSIS — D72829 Elevated white blood cell count, unspecified: Secondary | ICD-10-CM

## 2022-12-27 DIAGNOSIS — F3341 Major depressive disorder, recurrent, in partial remission: Secondary | ICD-10-CM | POA: Diagnosis not present

## 2022-12-27 DIAGNOSIS — E1169 Type 2 diabetes mellitus with other specified complication: Secondary | ICD-10-CM

## 2022-12-27 DIAGNOSIS — I152 Hypertension secondary to endocrine disorders: Secondary | ICD-10-CM | POA: Diagnosis not present

## 2022-12-27 DIAGNOSIS — J309 Allergic rhinitis, unspecified: Secondary | ICD-10-CM | POA: Diagnosis not present

## 2022-12-27 DIAGNOSIS — I4891 Unspecified atrial fibrillation: Secondary | ICD-10-CM

## 2022-12-27 DIAGNOSIS — K760 Fatty (change of) liver, not elsewhere classified: Secondary | ICD-10-CM | POA: Diagnosis not present

## 2022-12-27 DIAGNOSIS — Z23 Encounter for immunization: Secondary | ICD-10-CM | POA: Diagnosis not present

## 2022-12-27 DIAGNOSIS — E1165 Type 2 diabetes mellitus with hyperglycemia: Secondary | ICD-10-CM | POA: Diagnosis not present

## 2022-12-27 LAB — POCT GLYCOSYLATED HEMOGLOBIN (HGB A1C): HbA1c POC (<> result, manual entry): 9.1 % (ref 4.0–5.6)

## 2022-12-27 MED ORDER — ALPRAZOLAM 0.5 MG PO TABS
0.5000 mg | ORAL_TABLET | Freq: Every evening | ORAL | 2 refills | Status: DC | PRN
Start: 2022-12-27 — End: 2023-06-06

## 2022-12-27 MED ORDER — OZEMPIC (0.25 OR 0.5 MG/DOSE) 2 MG/3ML ~~LOC~~ SOPN
0.2500 mg | PEN_INJECTOR | SUBCUTANEOUS | 2 refills | Status: DC
Start: 2022-12-27 — End: 2023-01-24

## 2022-12-27 MED ORDER — MONTELUKAST SODIUM 10 MG PO TABS
10.0000 mg | ORAL_TABLET | Freq: Every day | ORAL | 3 refills | Status: DC
Start: 2022-12-27 — End: 2023-06-06

## 2022-12-27 NOTE — Assessment & Plan Note (Signed)
Follow-up with gastroenterology.  Will also check CA125 with next labs.

## 2022-12-27 NOTE — Progress Notes (Signed)
Established Patient Office Visit  Subjective   Patient ID: Lisa Gutierrez, female    DOB: May 10, 1950  Age: 72 y.o. MRN: [id number]  Chief Complaint  Patient presents with   Diabetes    HPI Lisa Gutierrez is a 72 y.o. female presenting today for follow up of hypertension, hyperlipidemia, diabetes.  She also complains of ongoing worry and fear after being hospitalized and diagnosed with atrial fibrillation.  She feels that she has not had many explanations about what atrial fibrillation is or what the specific medications or procedures are.  She is still following with behavioral health but feels that venlafaxine has never been helpful for her and would like to change regimens.  She also complains of daily runny nose before eating and daily itchy eyes.  Years ago, she had allergy testing that found that she is allergic to most plants, but she is not currently taking a daily allergy medicine.  She complains of RUQ abdominal pain, occasional nausea, early satiety. Hypertension: Patient here for follow-up of elevated blood pressure.  Also followed by cardiology for paroxysmal atrial fibrillation post DCCV.  Pt denies chest pain, SOB, dizziness, edema, syncope, fatigue or heart palpitations. Taking losartan 25 mg daily and metoprolol tartrate 50 mg daily for hypertension, reports excellent compliance with treatment. Denies side effects. Hyperlipidemia: Previously developed myalgias with statins.  The 10-year ASCVD risk score (Arnett DK, et al., 2019) is: 25.3% Diabetes: denies hypoglycemic events, wounds or sores that are not healing well, increased thirst or urination. Taking Jardiance and Janumet as prescribed without any side effects.   Outpatient Medications Prior to Visit  Medication Sig   acetaminophen (TYLENOL) 500 MG tablet Take 2 tablets by mouth as needed.   apixaban (ELIQUIS) 5 MG TABS tablet Take 1 tablet (5 mg total) by mouth 2 (two) times daily.   Bioflavonoid Products  (VITAMIN C) CHEW Chew by mouth daily. With vitamin D and elderberry. 2 chews daily   Blood Glucose Monitoring Suppl (ACCU-CHEK GUIDE) w/Device KIT by Does not apply route. Check BG as directed   Calcium Carbonate (CALCIUM 500 PO) Take 500 mg by mouth 2 (two) times daily.   clobetasol ointment (TEMOVATE) 0.05 % Apply topically 2 (two) times daily. Apply topically to affected twice daily until improved.   digoxin (LANOXIN) 0.25 MG tablet TAKE 1 TABLET BY MOUTH EVERY DAY   empagliflozin (JARDIANCE) 25 MG TABS tablet Take 1 tablet (25 mg total) by mouth daily before breakfast.   Flaxseed, Linseed, (FLAXSEED OIL PO) Take 1,500 mg by mouth 2 (two) times daily.   furosemide (LASIX) 20 MG tablet TAKE 1 TABLET BY MOUTH EVERY DAY   LINZESS 145 MCG CAPS capsule TAKE 1 CAPSULE BY MOUTH EVERY DAY   metoprolol tartrate (LOPRESSOR) 50 MG tablet TAKE 1.5 TABLETS BY MOUTH 2 TIMES DAILY.   Multiple Vitamins-Minerals (MULTIVITAMIN ADULT PO) Take 1 tablet by mouth daily.   pantoprazole (PROTONIX) 40 MG tablet TAKE 1 TABLET BY MOUTH TWICE A DAY   venlafaxine XR (EFFEXOR-XR) 150 MG 24 hr capsule Take 1 capsule (150 mg total) by mouth daily with breakfast.   venlafaxine XR (EFFEXOR-XR) 37.5 MG 24 hr capsule Take 1 capsule (37.5 mg total) by mouth daily with breakfast.   [DISCONTINUED] ALPRAZolam (XANAX) 0.5 MG tablet TAKE 1 TABLET (0.5 MG TOTAL) BY MOUTH AT BEDTIME AS NEEDED FOR ANXIETY OR SLEEP.   [DISCONTINUED] JANUMET 50-1000 MG tablet Take 1 tablet by mouth 2 (two) times daily with a meal.   losartan (  COZAAR) 25 MG tablet Take 1 tablet (25 mg total) by mouth daily.   [DISCONTINUED] albuterol (VENTOLIN HFA) 108 (90 Base) MCG/ACT inhaler Inhale 2 puffs into the lungs every 6 (six) hours as needed for wheezing or shortness of breath.   No facility-administered medications prior to visit.    ROS Negative unless otherwise noted in HPI   Objective:     BP 123/85   Pulse 83   Resp 18   Ht 5\' 6"  (1.676 m)   Wt  227 lb (103 kg)   SpO2 96%   BMI 36.64 kg/m   Physical Exam Constitutional:      General: She is not in acute distress.    Appearance: Normal appearance.  HENT:     Head: Normocephalic and atraumatic.  Cardiovascular:     Rate and Rhythm: Normal rate and regular rhythm.     Heart sounds: No murmur heard.    No friction rub. No gallop.  Pulmonary:     Effort: Pulmonary effort is normal. No respiratory distress.     Breath sounds: No wheezing, rhonchi or rales.  Skin:    General: Skin is warm and dry.  Neurological:     Mental Status: She is alert and oriented to person, place, and time.    Diabetic Foot Exam - Simple   Simple Foot Form Diabetic Foot exam was performed with the following findings: Yes 12/27/2022 11:57 AM  Visual Inspection No deformities, no ulcerations, no other skin breakdown bilaterally: Yes Sensation Testing Intact to touch and monofilament testing bilaterally: Yes Pulse Check Posterior Tibialis and Dorsalis pulse intact bilaterally: Yes Comments Right foot chronically swollen    Results for orders placed or performed in visit on 12/27/22  POCT HgB A1C  Result Value Ref Range   Hemoglobin A1C     HbA1c POC (<> result, manual entry) 9.1 4.0 - 5.6 %   HbA1c, POC (prediabetic range)     HbA1c, POC (controlled diabetic range)        12/27/2022   11:58 AM 11/08/2022    9:05 AM 10/15/2022    3:47 PM  Depression screen PHQ 2/9  Decreased Interest 3 3 3   Down, Depressed, Hopeless 3 3 2   PHQ - 2 Score 6 6 5   Altered sleeping 3 2 3   Tired, decreased energy 3 3 3   Change in appetite 3 3 3   Feeling bad or failure about yourself  3 3 3   Trouble concentrating 3 3 0  Moving slowly or fidgety/restless 3 2 2   Suicidal thoughts 0 0 0  PHQ-9 Score 24 22 19   Difficult doing work/chores Very difficult Very difficult Very difficult       12/27/2022   11:58 AM 11/08/2022    9:07 AM 09/25/2022   11:32 AM 07/18/2022    8:53 AM  GAD 7 : Generalized Anxiety Score   Nervous, Anxious, on Edge 3 2 2 1   Control/stop worrying 3 2 2 3   Worry too much - different things 3 3 2 3   Trouble relaxing 1 3 0 3  Restless 1 0 1 1  Easily annoyed or irritable 3 3 3 1   Afraid - awful might happen 3 2 2 1   Total GAD 7 Score 17 15 12 13   Anxiety Difficulty Extremely difficult Somewhat difficult Very difficult     Assessment & Plan:  Hypertension associated with type 2 diabetes mellitus (HCC) Assessment & Plan: BP goal <130/80.  Patient essentially at goal in office.  Continue  losartan 25 mg daily, metoprolol tartrate 50 mg daily and follow-up with cardiology.  Will continue to monitor and coordinate care with cardiology.  Orders: -     CBC with Differential/Platelet; Future -     Comprehensive metabolic panel; Future  Hyperlipidemia associated with type 2 diabetes mellitus (HCC) Assessment & Plan: History of statin intolerance, LDL remains slightly elevated at 123 as of 08/24/2021.  Repeating cholesterol panel with next labs, if LDL remains above goal will refer to Pharm.D. to discuss options other than statin medicines.   Type 2 diabetes mellitus with hyperglycemia, without long-term current use of insulin (HCC) Assessment & Plan: A1c further increased to 9.1.  Due to hyperglycemia, continue Jardiance to 25 mg daily.  She needs brand name as she has a savings card from the company.  Continue Janumet 50-1000 mg tablet twice daily with meals.  Continue ambulatory blood sugar monitoring.  Will recheck A1c at next visit and adjust management as indicated by results.  Orders: -     POCT glycosylated hemoglobin (Hb A1C) -     Ozempic (0.25 or 0.5 MG/DOSE); Inject 0.25 mg into the skin once a week.  Dispense: 3 mL; Refill: 2 -     CBC with Differential/Platelet; Future -     Comprehensive metabolic panel; Future -     Hemoglobin A1c; Future  Fatty liver disease, nonalcoholic Assessment & Plan: Elevated AST and ALT.  Patient previously established with  gastroenterology at [facility] clinic, encouraged her to reach out to schedule a follow-up appointment to discuss changes in liver status and additional GI symptoms.  Orders: -     Comprehensive metabolic panel; Future -     Lipid panel; Future  Class 2 severe obesity due to excess calories with serious comorbidity and body mass index (BMI) of 38.0 to 38.9 in adult (HCC) -     CBC with Differential/Platelet; Future -     Comprehensive metabolic panel; Future -     Hemoglobin A1c; Future -     Lipid panel; Future -     TSH Rfx on Abnormal to Free T4; Future -     VITAMIN D 25 Hydroxy (Vit-D Deficiency, Fractures); Future  Atrial fibrillation with RVR (HCC) Assessment & Plan: Followed by cardiology.  Continue digoxin and Eliquis as prescribed by cardiology.   GAD (generalized anxiety disorder) Assessment & Plan: Patient established with behavioral health, she is concerned about the $30 co-pay at every appointment with them.  PHQ-9 score 24, GAD-7 score 17.  I will get in touch with behavioral health to discuss changing medication as desvenlafaxine has not been effective for her.  Orders: -     ALPRAZolam; Take 1 tablet (0.5 mg total) by mouth at bedtime as needed for anxiety or sleep.  Dispense: 30 tablet; Refill: 2 -     CBC with Differential/Platelet; Future -     Comprehensive metabolic panel; Future  MDD (major depressive disorder), recurrent, in partial remission [facility]) Assessment & Plan: Patient established with behavioral health, she is concerned about the $30 co-pay at every appointment with them.  PHQ-9 score 24, GAD-7 score 17.  I will get in touch with behavioral health to discuss changing medication as desvenlafaxine has not been effective for her. Until then, continue Effexor 150 mg tablet and 37.5 mg tablet daily, bupropion 150 mg once daily.  Continue working with therapist.  Consider switching Effexor to Lexapro.  Will continue to monitor.   Chronic allergic  rhinitis Assessment & Plan: Given  symptoms of runny nose and itchy eyes, recommend daily montelukast 10 mg.  Orders: -     Montelukast Sodium; Take 1 tablet (10 mg total) by mouth at bedtime.  Dispense: 90 tablet; Refill: 3  Leukocytosis, unspecified type Assessment & Plan: WBC increased further and is now 11.6 with elevated neutrophils and monocytes.  Referring to hematology for further evaluation.  Orders: -     Ambulatory referral to Hematology / Oncology  Early satiety Assessment & Plan: Follow-up with gastroenterology.  Will also check CA125 with next labs.  Orders: -     CA 125  Need for influenza vaccination -     Flu Vaccine Trivalent High Dose (Fluad)    Return in about 3 months (around 03/28/2023) for follow-up for HTN, HLD, DM, fatigue, fasting blood work 1 week before.   I spent 90 minutes on the day of the encounter to include pre-visit record review, face-to-face time with the patient, and post visit ordering of tests.  [name], PA

## 2022-12-27 NOTE — Patient Instructions (Addendum)
Make an appointment with your gastroenterologist to talk about your liver and recent GI symptoms.  They will be the best place to figure out what is going on and what we can do moving forward.  The hematologist is located in the same building as the cancer center in [place].  STOP JANUMET, START OZEMPIC. THESE CANNOT BE TAKEN TOGETHER.  AT THE PHARMACY: -Tetanus booster (Tdap) -Pneumonia PCV20

## 2022-12-27 NOTE — Assessment & Plan Note (Signed)
Patient established with behavioral health, she is concerned about the $30 co-pay at every appointment with them.  PHQ-9 score 24, GAD-7 score 17.  I will get in touch with behavioral health to discuss changing medication as desvenlafaxine has not been effective for her.

## 2022-12-27 NOTE — Assessment & Plan Note (Signed)
BP goal <130/80.  Patient essentially at goal in office.  Continue losartan 25 mg daily, metoprolol tartrate 50 mg daily and follow-up with cardiology.  Will continue to monitor and coordinate care with cardiology.

## 2022-12-27 NOTE — Assessment & Plan Note (Signed)
Patient established with behavioral health, she is concerned about the $30 co-pay at every appointment with them.  PHQ-9 score 24, GAD-7 score 17.  I will get in touch with behavioral health to discuss changing medication as desvenlafaxine has not been effective for her. Until then, continue Effexor 150 mg tablet and 37.5 mg tablet daily, bupropion 150 mg once daily.  Continue working with therapist.  Consider switching Effexor to Lexapro.  Will continue to monitor.

## 2022-12-27 NOTE — Assessment & Plan Note (Signed)
WBC increased further and is now 11.6 with elevated neutrophils and monocytes.  Referring to hematology for further evaluation.

## 2022-12-27 NOTE — Assessment & Plan Note (Signed)
Given symptoms of runny nose and itchy eyes, recommend daily montelukast 10 mg.

## 2022-12-27 NOTE — Assessment & Plan Note (Signed)
Followed by cardiology.  Continue digoxin and Eliquis as prescribed by cardiology.

## 2022-12-27 NOTE — Assessment & Plan Note (Signed)
A1c further increased to 9.1.  Due to hyperglycemia, continue Jardiance to 25 mg daily.  She needs brand name as she has a savings card from the company.  Continue Janumet 50-1000 mg tablet twice daily with meals.  Continue ambulatory blood sugar monitoring.  Will recheck A1c at next visit and adjust management as indicated by results.

## 2022-12-27 NOTE — Assessment & Plan Note (Signed)
Elevated AST and ALT.  Patient previously established with gastroenterology at [facility] clinic, encouraged her to reach out to schedule a follow-up appointment to discuss changes in liver status and additional GI symptoms.

## 2022-12-27 NOTE — Assessment & Plan Note (Signed)
History of statin intolerance, LDL remains slightly elevated at 123 as of 08/24/2021.  Repeating cholesterol panel with next labs, if LDL remains above goal will refer to Pharm.D. to discuss options other than statin medicines.

## 2022-12-28 NOTE — Telephone Encounter (Signed)
Please see the MyChart message reply(ies) for my assessment and plan.    This patient gave consent for this Medical Advice Message and is aware that it may result in a bill to [employer], as well as the possibility of receiving a bill for a co-payment or deductible. They are an established patient, but are not seeking medical advice exclusively about a problem treated during an in person or video visit in the last seven days. I did not recommend an in person or video visit within seven days of my reply.    I spent a total of 15 minutes cumulative time within 7 days through [employer].  [name], PA

## 2022-12-31 ENCOUNTER — Telehealth: Payer: Self-pay | Admitting: Pharmacy Technician

## 2022-12-31 DIAGNOSIS — Z5986 Financial insecurity: Secondary | ICD-10-CM

## 2022-12-31 NOTE — Progress Notes (Signed)
[facility])  [facility] Quality Pharmacy Team   12/31/2022  Lisa Gutierrez [birth year] [id number]  Reason for referral: Medication assistance  Referral source:  Patient called me Current insurance: [employer]  Outreach:  Successful telephone call with patient.  HIPAA identifiers verified. Patient informs provider started her on Ozempic and she would like to apply for patient assistance for that medication if there is a program. [place] Jack is a household of 2 and income meets [employer] criteria. She has Community education officer and the prescriber is [name], [facility]. Based on this information an applicaiton will be mailed to the patient.   Medication Assistance Findings:  Medication assistance needs identified: Ozempic  Extra Help:  Not eligible for Extra Help Low Income Subsidy based on reported income and assets  Additional medication assistance options reviewed with patient as warranted:  No other options identified  Plan: I will route patient assistance letter to [facility] pharmacy technician who will coordinate patient assistance program application process for medications listed above.  [facility] pharmacy technician will assist with obtaining all required documents from both patient and provider(s) and submit application(s) once completed.  Thank you for allowing [facility] pharmacy to be a part of this patient's care.   [name], CPhT [place]  Office: [phone] Fax: [phone] Email: [email or web address]

## 2023-01-01 ENCOUNTER — Inpatient Hospital Stay: Payer: Medicare HMO

## 2023-01-01 ENCOUNTER — Inpatient Hospital Stay: Payer: Medicare HMO | Attending: Oncology | Admitting: Oncology

## 2023-01-01 ENCOUNTER — Encounter: Payer: Self-pay | Admitting: Oncology

## 2023-01-01 VITALS — BP 129/80 | HR 88 | Temp 96.9°F | Resp 18 | Ht 65.0 in | Wt 228.6 lb

## 2023-01-01 DIAGNOSIS — Z79899 Other long term (current) drug therapy: Secondary | ICD-10-CM | POA: Insufficient documentation

## 2023-01-01 DIAGNOSIS — D729 Disorder of white blood cells, unspecified: Secondary | ICD-10-CM

## 2023-01-01 DIAGNOSIS — D72829 Elevated white blood cell count, unspecified: Secondary | ICD-10-CM | POA: Diagnosis not present

## 2023-01-01 LAB — CBC WITH DIFFERENTIAL/PLATELET
Abs Immature Granulocytes: 0.06 10*3/uL (ref 0.00–0.07)
Basophils Absolute: 0.1 10*3/uL (ref 0.0–0.1)
Basophils Relative: 1 %
Eosinophils Absolute: 0 10*3/uL (ref 0.0–0.5)
Eosinophils Relative: 0 %
HCT: 42.5 % (ref 36.0–46.0)
Hemoglobin: 13.7 g/dL (ref 12.0–15.0)
Immature Granulocytes: 1 %
Lymphocytes Relative: 17 %
Lymphs Abs: 1.8 10*3/uL (ref 0.7–4.0)
MCH: 29.5 pg (ref 26.0–34.0)
MCHC: 32.2 g/dL (ref 30.0–36.0)
MCV: 91.4 fL (ref 80.0–100.0)
Monocytes Absolute: 1.1 10*3/uL — ABNORMAL HIGH (ref 0.1–1.0)
Monocytes Relative: 10 %
Neutro Abs: 7.8 10*3/uL — ABNORMAL HIGH (ref 1.7–7.7)
Neutrophils Relative %: 71 %
Platelets: 242 10*3/uL (ref 150–400)
RBC: 4.65 MIL/uL (ref 3.87–5.11)
RDW: 14.5 % (ref 11.5–15.5)
WBC: 10.8 10*3/uL — ABNORMAL HIGH (ref 4.0–10.5)
nRBC: 0 % (ref 0.0–0.2)

## 2023-01-01 IMAGING — MG MM DIGITAL SCREENING BILAT W/ TOMO AND CAD
6 of 10 series · 6 of 30 positions shown · non-contrast
Comparison: Previous exam(s).

CLINICAL DATA: Screening.

EXAM:
DIGITAL SCREENING BILATERAL MAMMOGRAM WITH TOMOSYNTHESIS AND CAD
TECHNIQUE: Bilateral screening digital craniocaudal and mediolateral oblique
mammograms were obtained. Bilateral screening digital breast
tomosynthesis was performed. The images were evaluated with
computer-aided detection.

[R MLO synth-2D (1 of 2)]
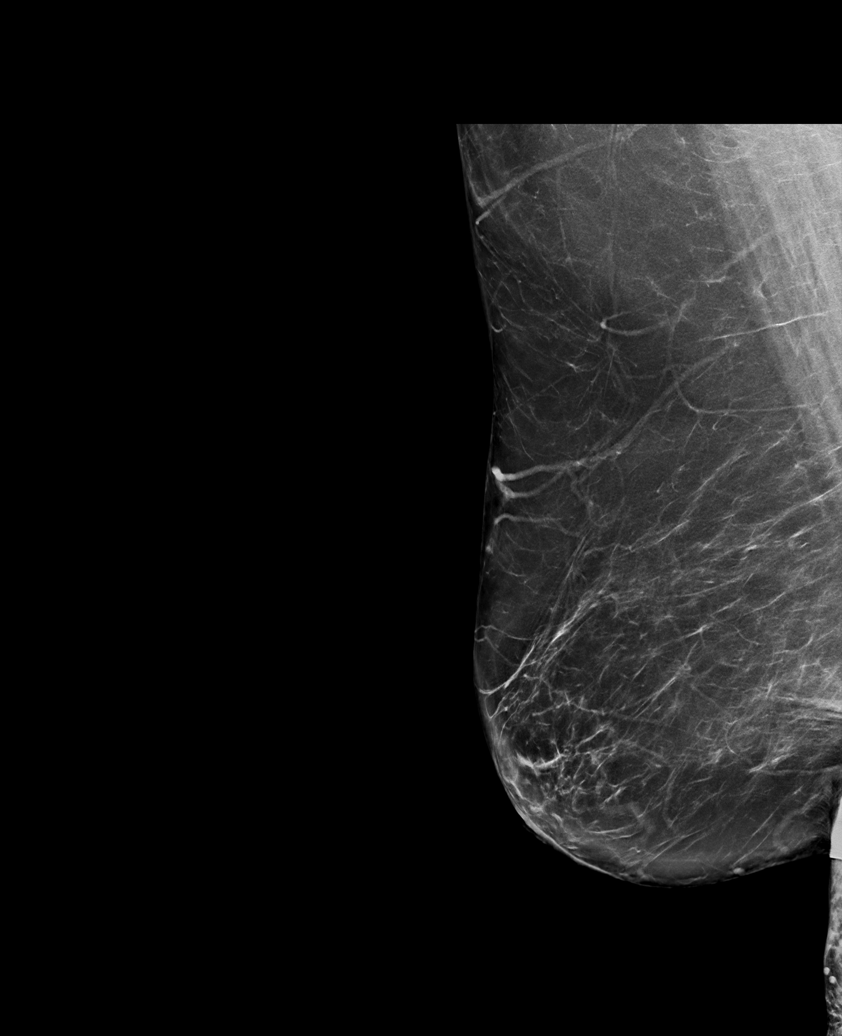

[L CC synth-2D]
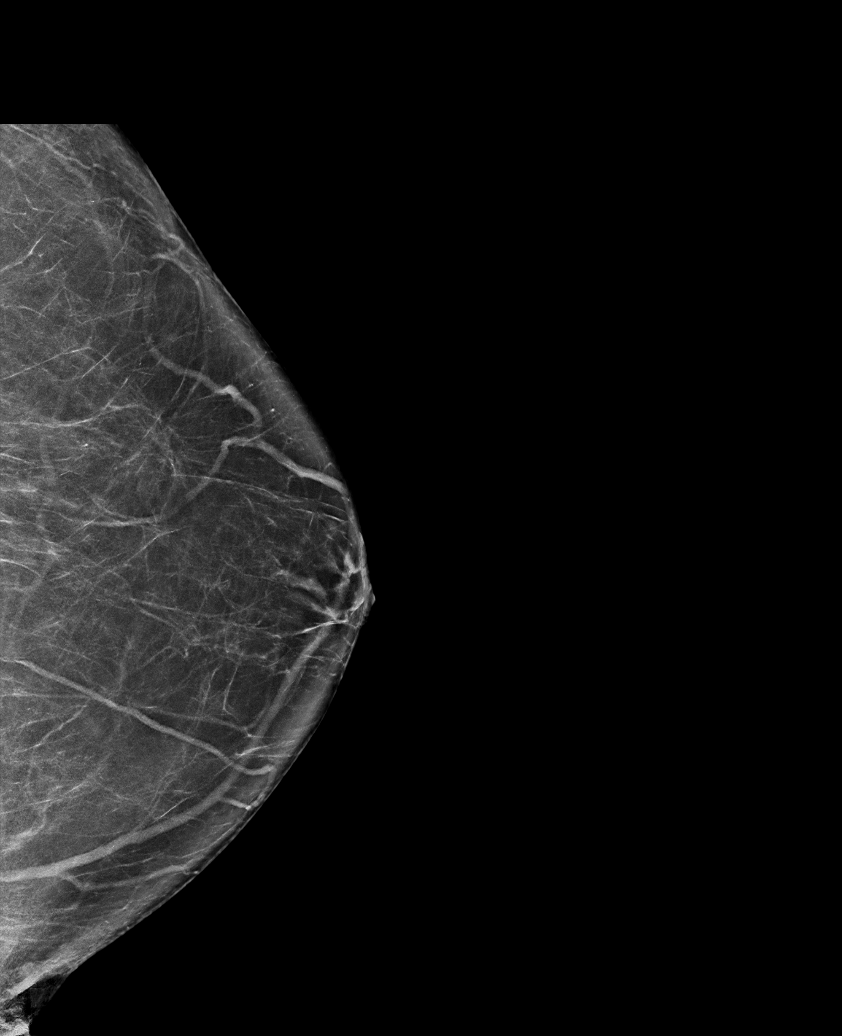

[L MLO synth-2D]
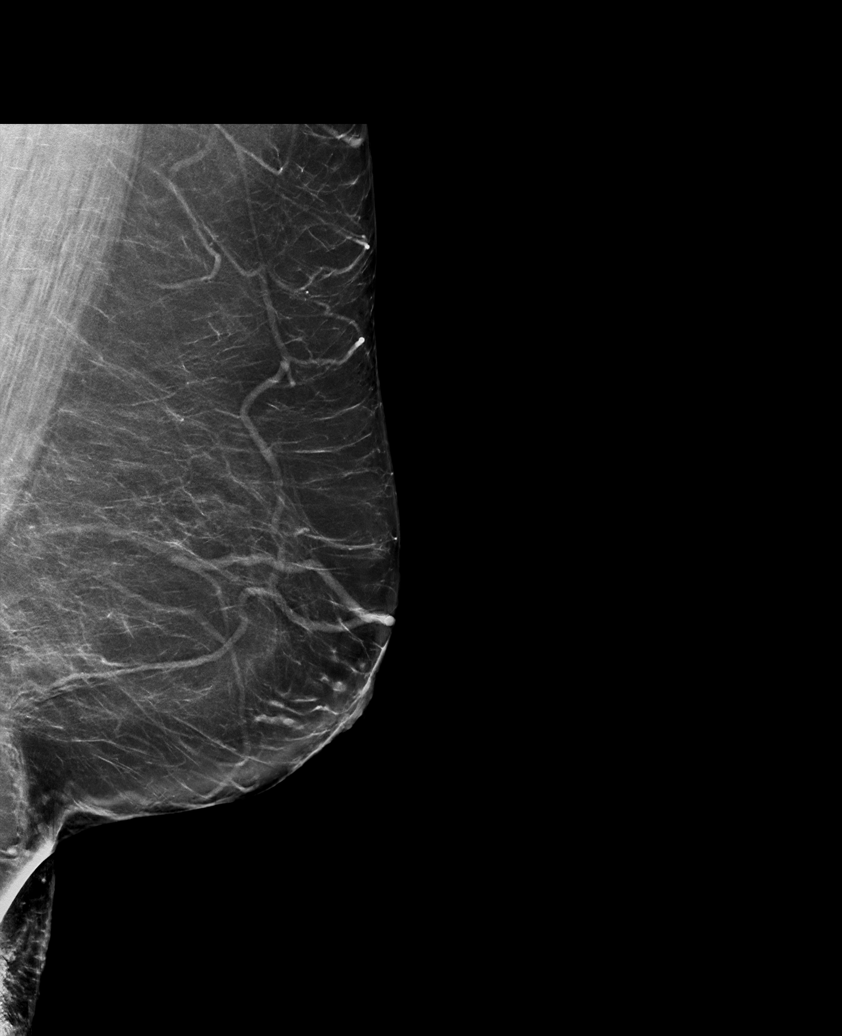

[R MLO synth-2D (2 of 2)]
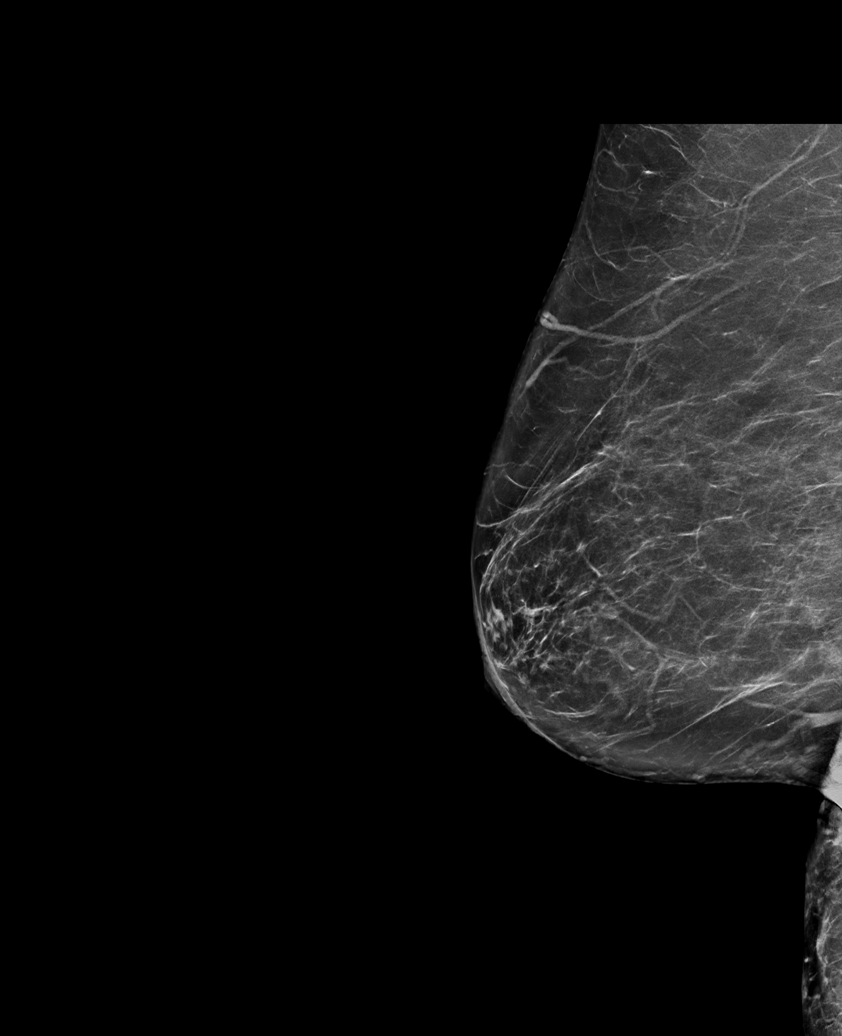

[R CC synth-2D]
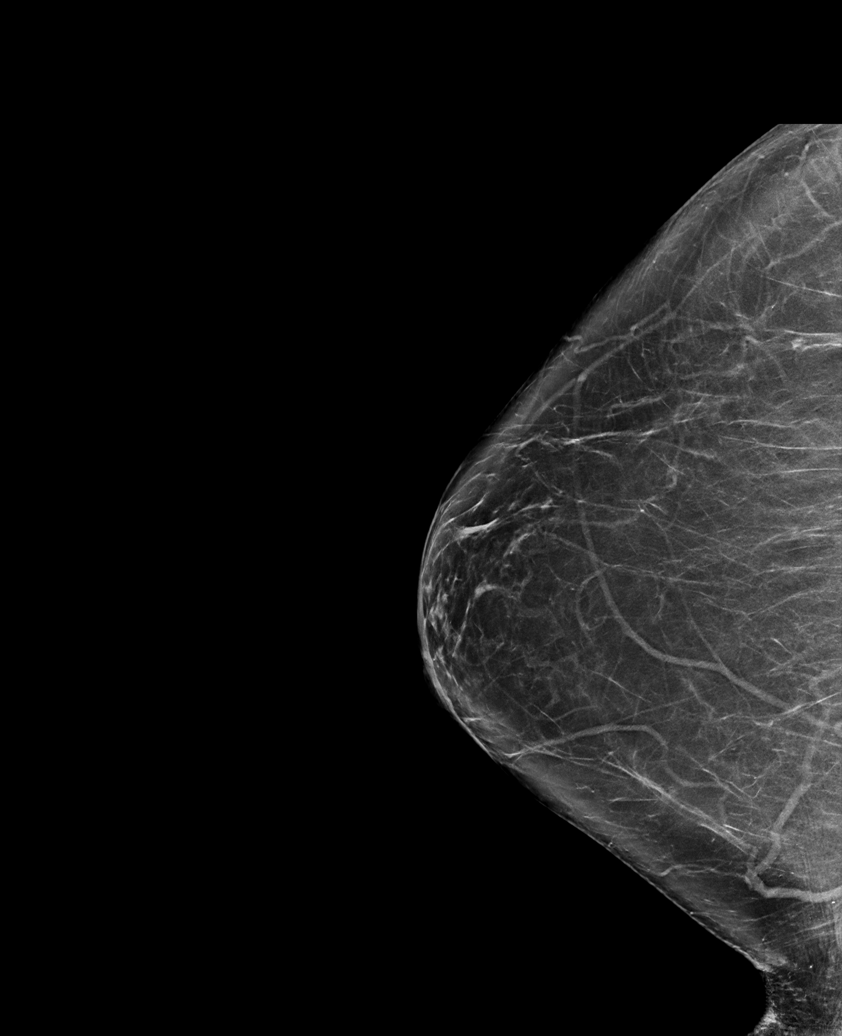

[L CC tomo · tomo slice 37/73.0]
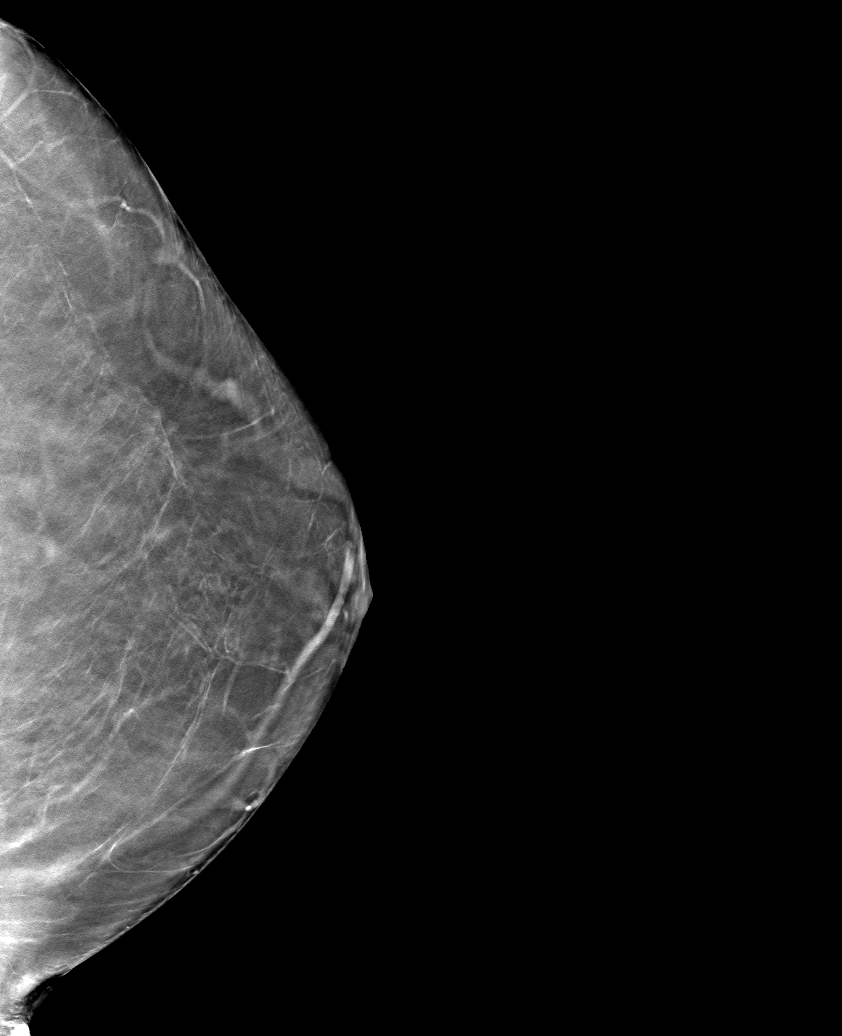

[6 of 30 positions shown; findings below may reference images not displayed]

ACR Breast Density Category b: There are scattered areas of
fibroglandular density.
FINDINGS: There are no findings suspicious for malignancy.
IMPRESSION: No mammographic evidence of malignancy. A result letter of this
screening mammogram will be mailed directly to the patient.

RECOMMENDATION:
Screening mammogram in one year. (Code:51-O-LD2)

BI-RADS CATEGORY  1: Negative.

## 2023-01-01 NOTE — Progress Notes (Signed)
Hematology/Oncology Consult note [facility] [phone] [phone]  Patient Care Team: [name], PA as PCP - General (Family Medicine) Iran Ouch, MD as PCP - Cardiology (Cardiology) Juanell Fairly, RN as [facility] Care Management Elijah Birk, [place], [place] as Social Worker Creig Hines, MD as Consulting Physician (Oncology)   Name of the patient: Lisa Gutierrez  [id number]  Dec 22, 1950    Reason for referral-leukocytosis   Referring physician-Morgan Leodis Rains PA  Date of visit: 01/01/23   History of presenting illness- Patient is a 72 year old female with past medical history significant for hypertension hyperlipidemia type 2 diabetes atrial fibrillation and referred for leukocytosis.  White cell count was elevated at 11.6 on 12/04/2022.  Differential showed mild neutrophilia with an absolute neutrophil count of 8.5 and monocytosis with a monocyte count of 1.1.  Hemoglobin and platelets have been normal.  Patient may be needing ablation for atrial fibrillation soon.  She denies any specific complaints at this time other than chronic fatigue.  ECOG PS- 2  Pain scale- 0   Review of systems- Review of Systems  Constitutional:  Positive for malaise/fatigue. Negative for chills, fever and weight loss.  HENT:  Negative for congestion, ear discharge and nosebleeds.   Eyes:  Negative for blurred vision.  Respiratory:  Negative for cough, hemoptysis, sputum production, shortness of breath and wheezing.   Cardiovascular:  Negative for chest pain, palpitations, orthopnea and claudication.  Gastrointestinal:  Negative for abdominal pain, blood in stool, constipation, diarrhea, heartburn, melena, nausea and vomiting.  Genitourinary:  Negative for dysuria, flank pain, frequency, hematuria and urgency.  Musculoskeletal:  Negative for back pain, joint pain and myalgias.  Skin:  Negative for rash.  Neurological:  Negative  for dizziness, tingling, focal weakness, seizures, weakness and headaches.  Endo/Heme/Allergies:  Does not bruise/bleed easily.  Psychiatric/Behavioral:  Negative for depression and suicidal ideas. The patient does not have insomnia.     Allergies  Allergen Reactions   Aspirin Other (See Comments)    GI upset   Ibuprofen Other (See Comments)    GERD   Levofloxacin Other (See Comments)    Weight gain   Meloxicam Other (See Comments)    GI upset   Morphine Other (See Comments)   Naprosyn  [Naproxen] Other (See Comments)    GI upset   Nsaids Other (See Comments)    GI upset   Propofol Other (See Comments)    Had horrible nightmares   Quinolones    Robinul  [Glycopyrrolate] Nausea And Vomiting   Penicillin V Potassium Rash   Sulfa Antibiotics Rash    Patient Active Problem List   Diagnosis Date Noted   Hyperlipidemia associated with type 2 diabetes mellitus (HCC) 12/27/2022   Leukocytosis 12/27/2022   Early satiety 12/27/2022   Atrial fibrillation with RVR (HCC) 07/10/2022   Hypotension 07/10/2022   Prurigo nodularis 01/01/2022   Chronic pain of left knee 01/03/2021   Cyst of right ovary 12/09/2020   Class 2 severe obesity due to excess calories with serious comorbidity and body mass index (BMI) of 38.0 to 38.9 in adult (HCC) 10/03/2020   Statin intolerance 03/06/2020   Vertigo 05/22/2019   Chronic allergic rhinitis 03/18/2019   Marital relationship problem 01/30/2019   MDD (major depressive disorder), recurrent, in partial remission (HCC) 01/30/2019   Primary insomnia 02/06/2018   Hypertension associated with type 2 diabetes mellitus (HCC) 12/06/2017   GAD (generalized anxiety disorder) 12/06/2017   Gastroesophageal reflux disease without esophagitis  12/06/2017   Type 2 diabetes mellitus with hyperglycemia, without long-term current use of insulin (HCC) 12/06/2017   Fatty liver disease, nonalcoholic 08/29/2017   Chronic superficial gastritis without bleeding 11/23/2016    Irritable bowel syndrome with constipation 11/06/2016   External hemorrhoids 11/06/2016     Past Medical History:  Diagnosis Date   Anxiety    Atrial fibrillation (HCC)    Depression    Depression    Phreesia 07/02/2020   Diabetes mellitus without complication (HCC)    GERD (gastroesophageal reflux disease)    Hypertension    IBS (irritable bowel syndrome)    Neuromuscular disorder (HCC)      Past Surgical History:  Procedure Laterality Date   BREAST EXCISIONAL BIOPSY     CARDIOVERSION N/A 09/03/2022   Procedure: CARDIOVERSION;  Surgeon: Iran Ouch, MD;  Location: [facility] ORS;  Service: Cardiovascular;  Laterality: N/A;   CATARACT EXTRACTION W/PHACO Right 07/24/2022   Procedure: CATARACT EXTRACTION PHACO AND INTRAOCULAR LENS PLACEMENT (IOC) RIGHT DIABETIC  5.93  00:42.5;  Surgeon: Galen Manila, MD;  Location: [facility];  Service: Ophthalmology;  Laterality: Right;   CATARACT EXTRACTION W/PHACO Left 08/07/2022   Procedure: CATARACT EXTRACTION PHACO AND INTRAOCULAR LENS PLACEMENT (IOC) LEFT DIABETIC  12.33  00:58.3;  Surgeon: Galen Manila, MD;  Location: [facility];  Service: Ophthalmology;  Laterality: Left;   CESAREAN SECTION N/A    Phreesia 07/02/2020   CHOLECYSTECTOMY     EXCISION / BIOPSY BREAST / NIPPLE / DUCT Right 1973   duct removed   SPINE SURGERY N/A    Phreesia 07/02/2020   TUBAL LIGATION N/A    Phreesia 07/02/2020    Social History   Socioeconomic History   Marital status: Married    Spouse name: Not on file   Number of children: Not on file   Years of education: Not on file   Highest education level: Not on file  Occupational History   Not on file  Tobacco Use   Smoking status: Former    Passive exposure: Past   Smokeless tobacco: Never  Vaping Use   Vaping status: Never Used  Substance and Sexual Activity   Alcohol use: No    Alcohol/week: 0.0 standard drinks of alcohol   Drug use: Never   Sexual activity: Not  Currently    Partners: Male  Other Topics Concern   Not on file  Social History Narrative   Not on file   Social Determinants of Health   Financial Resource Strain: Low Risk  (10/15/2022)   Overall Financial Resource Strain (CARDIA)    Difficulty of Paying Living Expenses: Not very hard  Food Insecurity: No Food Insecurity (10/15/2022)   Hunger Vital Sign    Worried About Running Out of Food in the Last Year: Never true    Ran Out of Food in the Last Year: Never true  Transportation Needs: No Transportation Needs (08/09/2022)   PRAPARE - Administrator, Civil Service (Medical): No    Lack of Transportation (Non-Medical): No  Physical Activity: Inactive (08/09/2022)   Exercise Vital Sign    Days of Exercise per Week: 0 days    Minutes of Exercise per Session: 0 min  Stress: Stress Concern Present (10/15/2022)   [employer] of Occupational Health - Occupational Stress Questionnaire    Feeling of Stress : Rather much  Social Connections: Socially Integrated (08/09/2022)   Social Connection and Isolation Panel [NHANES]    Frequency of Communication with Friends and  Family: More than three times a week    Frequency of Social Gatherings with Friends and Family: More than three times a week    Attends Religious Services: More than 4 times per year    Active Member of [employer] or Organizations: Yes    Attends Engineer, structural: More than 4 times per year    Marital Status: Married  Catering manager Violence: Not At Risk (08/09/2022)   Humiliation, Afraid, Rape, and Kick questionnaire    Fear of Current or Ex-Partner: No    Emotionally Abused: No    Physically Abused: No    Sexually Abused: No     Family History  Problem Relation Age of Onset   Diabetes Mother    Hypertension Mother    Coronary artery disease Father    Glaucoma Father    Breast cancer Neg Hx      Current Outpatient Medications:    acetaminophen (TYLENOL) 500 MG tablet, Take 2 tablets by  mouth as needed., Disp: , Rfl:    ALPRAZolam (XANAX) 0.5 MG tablet, Take 1 tablet (0.5 mg total) by mouth at bedtime as needed for anxiety or sleep., Disp: 30 tablet, Rfl: 2   apixaban (ELIQUIS) 5 MG TABS tablet, Take 1 tablet (5 mg total) by mouth 2 (two) times daily., Disp: 60 tablet, Rfl: 5   Bioflavonoid Products (VITAMIN C) CHEW, Chew by mouth daily. With vitamin D and elderberry. 2 chews daily, Disp: , Rfl:    Blood Glucose Monitoring Suppl (ACCU-CHEK GUIDE) w/Device KIT, by Does not apply route. Check BG as directed, Disp: , Rfl:    Calcium Carbonate (CALCIUM 500 PO), Take 500 mg by mouth 2 (two) times daily., Disp: , Rfl:    clobetasol ointment (TEMOVATE) 0.05 %, Apply topically 2 (two) times daily. Apply topically to affected twice daily until improved., Disp: 30 g, Rfl: 2   digoxin (LANOXIN) 0.25 MG tablet, TAKE 1 TABLET BY MOUTH EVERY DAY, Disp: 30 tablet, Rfl: 1   empagliflozin (JARDIANCE) 25 MG TABS tablet, Take 1 tablet (25 mg total) by mouth daily before breakfast., Disp: 90 tablet, Rfl: 1   Flaxseed, Linseed, (FLAXSEED OIL PO), Take 1,500 mg by mouth 2 (two) times daily., Disp: , Rfl:    furosemide (LASIX) 20 MG tablet, TAKE 1 TABLET BY MOUTH EVERY DAY, Disp: 30 tablet, Rfl: 1   LINZESS 145 MCG CAPS capsule, TAKE 1 CAPSULE BY MOUTH EVERY DAY, Disp: 30 capsule, Rfl: 2   metoprolol tartrate (LOPRESSOR) 50 MG tablet, TAKE 1.5 TABLETS BY MOUTH 2 TIMES DAILY., Disp: 90 tablet, Rfl: 0   montelukast (SINGULAIR) 10 MG tablet, Take 1 tablet (10 mg total) by mouth at bedtime., Disp: 90 tablet, Rfl: 3   Multiple Vitamins-Minerals (MULTIVITAMIN ADULT PO), Take 1 tablet by mouth daily., Disp: , Rfl:    pantoprazole (PROTONIX) 40 MG tablet, TAKE 1 TABLET BY MOUTH TWICE A DAY, Disp: 180 tablet, Rfl: 1   Semaglutide,0.25 or 0.5MG /DOS, (OZEMPIC, 0.25 OR 0.5 MG/DOSE,) 2 MG/3ML SOPN, Inject 0.25 mg into the skin once a week., Disp: 3 mL, Rfl: 2   venlafaxine XR (EFFEXOR-XR) 150 MG 24 hr capsule, Take 1  capsule (150 mg total) by mouth daily with breakfast., Disp: 90 capsule, Rfl: 0   venlafaxine XR (EFFEXOR-XR) 37.5 MG 24 hr capsule, Take 1 capsule (37.5 mg total) by mouth daily with breakfast., Disp: 90 capsule, Rfl: 0   losartan (COZAAR) 25 MG tablet, Take 1 tablet (25 mg total) by mouth daily., Disp:  90 tablet, Rfl: 3   Physical exam:  Vitals:   01/01/23 1344  BP: 129/80  Pulse: 88  Resp: 18  Temp: (!) 96.9 F (36.1 C)  TempSrc: Tympanic  SpO2: 98%  Weight: 228 lb 9.6 oz (103.7 kg)  Height: 5\' 5"  (1.651 m)   Physical Exam Cardiovascular:     Rate and Rhythm: Normal rate and regular rhythm.     Heart sounds: Normal heart sounds.  Pulmonary:     Effort: Pulmonary effort is normal.     Breath sounds: Normal breath sounds.  Abdominal:     General: Bowel sounds are normal. There is no distension.     Palpations: Abdomen is soft.     Tenderness: There is no abdominal tenderness.     Comments: No palpable hepatosplenomegaly  Skin:    General: Skin is warm and dry.  Neurological:     Mental Status: She is alert and oriented to person, place, and time.           Latest Ref Rng & Units 12/04/2022    3:55 PM  CMP  Glucose 70 - 99 mg/dL 782   BUN 8 - 27 mg/dL 13   Creatinine 9.56 - 1.00 mg/dL 2.13   Sodium 086 - 578 mmol/L 137   Potassium 3.5 - 5.2 mmol/L 4.4   Chloride 96 - 106 mmol/L 98   CO2 20 - 29 mmol/L 21   Calcium 8.7 - 10.3 mg/dL 46.9   Total Protein 6.0 - 8.5 g/dL 6.8   Total Bilirubin 0.0 - 1.2 mg/dL 0.2   Alkaline Phos 44 - 121 IU/L 58   AST 0 - 40 IU/L 47   ALT 0 - 32 IU/L 44       Latest Ref Rng & Units 01/01/2023    2:22 PM  CBC  WBC 4.0 - 10.5 K/uL 10.8   Hemoglobin 12.0 - 15.0 g/dL 62.9   Hematocrit 52.8 - 46.0 % 42.5   Platelets 150 - 400 K/uL 242     Assessment and plan- Patient is a 72 y.o. female referred for leukocytosis  Patient has mild leukocytosis with a white count fluctuates between 10-12 without a clear rising trend.   Differential mainly showed neutrophilia and monocytosis.  Hemoglobin and platelets are normal.  I suspect this is reactive and not secondary to underlying bone marrow disorder.  I will check CBC with differential and flow cytometry today.  I will see her back in 2 weeks for in person or video visit to discuss lab test results   Thank you for this kind referral and the opportunity to participate in the care of this  Patient   Visit Diagnosis 1. Neutrophilia     Dr. Owens Shark, MD, MPH [facility] [phone] 01/01/2023

## 2023-01-02 ENCOUNTER — Institutional Professional Consult (permissible substitution): Payer: Medicare HMO | Admitting: Cardiology

## 2023-01-03 LAB — COMP PANEL: LEUKEMIA/LYMPHOMA

## 2023-01-04 ENCOUNTER — Telehealth: Payer: Self-pay | Admitting: Pharmacy Technician

## 2023-01-04 DIAGNOSIS — Z5986 Financial insecurity: Secondary | ICD-10-CM

## 2023-01-04 NOTE — Progress Notes (Addendum)
[facility] Customer service manager [facility])                                            [facility] Quality Pharmacy Team    01/04/2023  Lisa Gutierrez Aug 21, 1950 [id number]                                      Medication Assistance Referral  Referral From:  Patient self referred  Medication/Company: Franki Monte / [employer] Patient application portion:  Mining engineer portion: Faxed  to [name] , [facility] Provider address/fax verified via: Office website  [name], CPhT   Office: [phone] Fax: [phone] Email: [email or web address]

## 2023-01-06 ENCOUNTER — Other Ambulatory Visit: Payer: Self-pay | Admitting: Cardiology

## 2023-01-07 ENCOUNTER — Telehealth: Payer: Self-pay | Admitting: Pharmacist

## 2023-01-07 ENCOUNTER — Other Ambulatory Visit: Payer: Medicare HMO | Admitting: Pharmacist

## 2023-01-07 ENCOUNTER — Ambulatory Visit: Payer: Self-pay | Admitting: *Deleted

## 2023-01-07 NOTE — Progress Notes (Signed)
01/07/2023 Name: Lisa Gutierrez MRN: [id number] DOB: 01-Aug-1950  Attempted to contact patient in response to forwarded staff message regarding medication questions. No answer, left HIPAA compliant message for patient to return my call at their convenience. Will also send mychart.  Lynnda Shields, PharmD, BCPS Clinical Pharmacist [facility]

## 2023-01-07 NOTE — Patient Outreach (Signed)
Care Coordination   Follow Up Visit Note   01/07/2023 Name: Lisa Gutierrez MRN: [id number] DOB: [birth year]  Lisa Gutierrez is a 72 y.o. year old female who sees [name], [place] for primary care. I spoke with  [name] by phone today.  What matters to the patients health and wellness today?  Still concerned about side effects, medical issues and .......    Goals Addressed             This Visit's Progress    Reduce symptoms related to depression and  anxiety       Activities and task to complete in order to accomplish goals.   Consider 1 visit to Psychiatry for medication review/changes (save up the copay money and then schedule) Review and consider the additional support resources mailed to you Call to inquire about your recent/previous counselor plans to connect you with new counselor Find ways to relax and reduce your anxiety- email with material being sent today Find ways to remove yourself from conversations that are upsetting  Call your insurance provider for more information about your Enhanced Benefits ( ) Start / continue relaxed breathing 3 times daily Keep all upcoming appointment discussed today Continue with compliance of taking medication prescribed by Doctor Consider plans for on-going plans for behavioral health care as discussed Call [phone] for mental health support/counselor 24/7   Call 911 for emergent need Consider local Hospice agency for free grief counseling and support         SDOH assessments and interventions completed:  {yes/no:20286}{THN Tip this will not be part of the note when signed-REQUIRED REPORT FIELD DO NOT DELETE (Optional):27901}     Care Coordination Interventions:  {INTERVENTIONS:27767} {THN Tip this will not be part of the note when signed-REQUIRED REPORT FIELD DO NOT DELETE (Optional):27901}  Follow up plan: {CCFOLLOWUP:27768}   Encounter Outcome:  {ENCOUTCOME:27770} {THN Tip this will not be  part of the note when signed-REQUIRED REPORT FIELD DO NOT DELETE (Optional):27901}

## 2023-01-07 NOTE — Progress Notes (Unsigned)
01/09/2023 Name: Lisa Gutierrez MRN: [id number] DOB: [birth year]  Chief Complaint  Patient presents with   Medication Management    Lisa Gutierrez is a 72 y.o. year old female who presented for a telephone visit.   They were referred to the pharmacist by their Case Management Team  for assistance in managing diabetes and complex medication management. Patient had questions related to ozempic.   Subjective:  Care Team: Primary Care Provider: [name], PA   Medication Access/Adherence  Current Pharmacy:  [facility] [phone] Nich[place] Rough, [place] - [address] [place] [address] Phone: [phone] Fax: [phone]  [facility] - [place], [place] - [address], [address], [address] [place] [address] Phone: [phone] Fax: [phone]  [facility] - [place], [place] - [address] [place] [address] Phone: [phone] Fax: [phone]    Diabetes:  Current medications: jardiance 25mg  daily, new RX for ozempic but has not started yet  Patient expressed concern with medication safety - wanted to review drug-drug interactions, how this medication will work, etc.  Current medication access support: ozempic via novonordisk in process   Objective:  Lab Results  Component Value Date   HGBA1C 9.1 12/27/2022    Lab Results  Component Value Date   CREATININE 0.69 12/04/2022   BUN 13 12/04/2022   NA 137 12/04/2022   K 4.4 12/04/2022   CL 98 12/04/2022   CO2 21 12/04/2022    Lab Results  Component Value Date   CHOL 195 08/24/2021   HDL 31 (L) 08/24/2021   LDLCALC 123 (H) 08/24/2021   TRIG 233 (H) 08/24/2021   CHOLHDL 6.3 (H) 08/24/2021    Medications Reviewed Today     Reviewed by Gabriel Carina, RPH (Pharmacist) on 01/09/23 at 1609  Med List Status: <None>   Medication Order Taking? Sig Documenting Provider Last Dose Status Informant   acetaminophen (TYLENOL) 500 MG tablet [id number]  Take 2 tablets by mouth as needed. [provider]  Active Self, Pharmacy Records           Med Note Luster Landsberg, TIFFANI S   Tue Jul 10, 2022  5:44 PM)    ALPRAZolam Prudy Feeler) 0.5 MG tablet [id number] Yes Take 1 tablet (0.5 mg total) by mouth at bedtime as needed for anxiety or sleep. [name], PA Taking Active   apixaban (ELIQUIS) 5 MG TABS tablet [id number] Yes Take 1 tablet (5 mg total) by mouth 2 (two) times daily. Iran Ouch, MD Taking Active   Bioflavonoid Products (VITAMIN C) CHEW [id number]  Chew by mouth daily. With vitamin D and elderberry. 2 chews daily [provider]  Active Self, Pharmacy Records  Blood Glucose Monitoring Suppl (ACCU-CHEK GUIDE) w/Device KIT [id number]  by Does not apply route. Check BG as directed [provider]  Active            Med Note Littie Deeds, CHERYL A   Sun Dec 09, 2022 10:31 AM) Accu check Guide meter and strips and Softclix lancets  Calcium Carbonate (CALCIUM 500 PO) [id number]  Take 500 mg by mouth 2 (two) times daily. [provider]  Active Self, Pharmacy Records  clobetasol ointment (TEMOVATE) 0.05 % [id number]  Apply topically 2 (two) times daily. Apply topically to affected twice daily until improved. Carlean Jews, NP  Active            Med Note (GENTRY, CHERYL A  Mon Dec 03, 2022  4:43 PM) When needed  digoxin (LANOXIN) 0.25 MG tablet [id number] Yes TAKE 1 TABLET BY MOUTH EVERY DAY Hammock, Sheri, NP Taking Active   empagliflozin (JARDIANCE) 25 MG TABS tablet [id number]  Take 1 tablet (25 mg total) by mouth daily before breakfast. [name], PA  Active            Med Note Littie Deeds, CHERYL A   Mon Dec 03, 2022  4:44 PM)    Flaxseed, Linseed, (FLAXSEED OIL PO) [id number]  Take 1,500 mg by mouth 2 (two) times daily. [provider]  Active Self, Pharmacy Records  furosemide (LASIX) 20 MG tablet [id number]  TAKE 1 TABLET BY MOUTH EVERY DAY  Charlsie Quest, NP  Active            Med Note Littie Deeds, CHERYL A   Mon Dec 03, 2022  4:45 PM) If needed for swelling/fuild retention  LINZESS 145 MCG CAPS capsule [id number] Yes TAKE 1 CAPSULE BY MOUTH EVERY DAY Carlean Jews, NP Taking Active Self, Pharmacy Records  losartan (COZAAR) 25 MG tablet [id number]  Take 1 tablet (25 mg total) by mouth daily. Charlsie Quest, NP  Expired 12/03/22 2359   metoprolol tartrate (LOPRESSOR) 50 MG tablet [id number] Yes TAKE 1.5 TABLETS BY MOUTH 2 TIMES DAILY. Charlsie Quest, NP Taking Active   montelukast (SINGULAIR) 10 MG tablet [id number]  Take 1 tablet (10 mg total) by mouth at bedtime. [name], PA  Active   Multiple Vitamins-Minerals (MULTIVITAMIN ADULT PO) [id number] Yes Take 1 tablet by mouth daily. [provider] Taking Active Self, Pharmacy Records  pantoprazole (PROTONIX) 40 MG tablet [id number]  TAKE 1 TABLET BY MOUTH TWICE A DAY Boscia, Heather E, NP  Active   Semaglutide,0.25 or 0.5MG /DOS, (OZEMPIC, 0.25 OR 0.5 MG/DOSE,) 2 MG/3ML SOPN [id number]  Inject 0.25 mg into the skin once a week. [name], PA  Active   venlafaxine XR (EFFEXOR-XR) 150 MG 24 hr capsule [id number]  Take 1 capsule (150 mg total) by mouth daily with breakfast. [name] A, PA  Active   venlafaxine XR (EFFEXOR-XR) 37.5 MG 24 hr capsule [id number]  Take 1 capsule (37.5 mg total) by mouth daily with breakfast. [name], PA  Active               Assessment/Plan:   Diabetes: - Currently uncontrolled, but planning to start ozempic - Reviewed long term cardiovascular and renal outcomes of uncontrolled blood sugar - Reviewed goal A1c, goal fasting, and goal 2 hour post prandial glucose - Completed drug-drug interaction review of full medication list, explained in depth to patient - Reviewed ozempic dosing, side effects, mechanism of action, and benefits. - Recommend to initiate ozempic 0.25mg  weekly.  - Recommend to check glucose per  current routine - Meets financial criteria for ozempic patient assistance program through novonordisk. Jill Simcox CPhT has already sent patient paperwork for assistance, will follow along for progress.     Follow Up Plan: 2-3 weeks  Lynnda Shields, PharmD, BCPS Clinical Pharmacist [facility]

## 2023-01-08 NOTE — Patient Instructions (Signed)
Visit Information  Thank you for taking time to visit with me today. Please don't hesitate to contact me if I can be of assistance to you.   Following are the goals we discussed today:   Goals Addressed             This Visit's Progress    Reduce symptoms related to depression and  anxiety       Activities and task to complete in order to accomplish goals.   Consider 1 visit to Psychiatry for medication review/changes (save up the copay money and then schedule) Review and consider the additional support resources mailed to you Call to inquire about your recent/previous counselor plans to connect you with new counselor Find ways to relax and reduce your anxiety- email with material being sent today Find ways to remove yourself from conversations that are upsetting  Call your insurance provider for more information about your Enhanced Benefits ( ) Start / continue relaxed breathing 3 times daily Keep all upcoming appointment discussed today Continue with compliance of taking medication prescribed by Doctor Consider plans for on-going plans for behavioral health care as discussed Call [phone] for mental health support/counselor 24/7   Call 911 for emergent need Consider local Hospice agency for free grief counseling and support         Our next appointment is by telephone on 01/21/23 Please call the care guide team at [phone] if you need to cancel or reschedule your appointment.   If you are experiencing a Mental Health or Behavioral Health Crisis or need someone to talk to, please call the Suicide and Crisis Lifeline: 988 call 911   The patient verbalized understanding of instructions, educational materials, and care plan provided today and DECLINED offer to receive copy of patient instructions, educational materials, and care plan.   Telephone follow up appointment with care management team member scheduled for:01/21/23  Reece Levy, MSW, LCSW Clinical Social  Worker [phone]

## 2023-01-09 ENCOUNTER — Other Ambulatory Visit: Payer: Medicare HMO | Admitting: Pharmacist

## 2023-01-09 NOTE — Progress Notes (Signed)
01/09/2023 Name: Lisa Gutierrez MRN: [id number] DOB: March 18, 1951  Care Coordination Call  Answered incoming call from patient regarding further questions about ozempic.   She has picked up the medication from her pharmacy.   Reviewed side effects, monitoring, and injection technique. Patient verbalized understanding. She wants to begin Friday, 01/11/23 with her first injection.  Follow-Up: Will outreach Monday, 01/14/23 to offer additional support.  Lynnda Shields, PharmD, BCPS Clinical Pharmacist [facility]

## 2023-01-10 ENCOUNTER — Telehealth: Payer: Self-pay

## 2023-01-10 DIAGNOSIS — K581 Irritable bowel syndrome with constipation: Secondary | ICD-10-CM

## 2023-01-10 MED ORDER — LINZESS 145 MCG PO CAPS: 145.0000 ug | ORAL_CAPSULE | Freq: Every day | ORAL | 2 refills | Status: DC

## 2023-01-10 NOTE — Telephone Encounter (Signed)
Spoke with [facility] representative regarding verbal order  for Linzess 145 mcg. Representative stated that a refill is scheduled for send out on 01/25/23 90 day supply

## 2023-01-10 NOTE — Telephone Encounter (Signed)
Refill sent.

## 2023-01-10 NOTE — Telephone Encounter (Signed)
[facility] Patient Assistance Number [phone]  Please call this number for the refill for Linzess. Please cancel the [facility] from [facility].

## 2023-01-10 NOTE — Telephone Encounter (Signed)
Prescription Request  01/10/2023  LOV: 12/27/22  What is the name of the medication or equipment?  LINZESS 145 MCG CAPS capsule   Have you contacted your pharmacy to request a refill? No   Which pharmacy would you like this sent to?  [facility] Patient [facility]    Patient notified that their request is being sent to the clinical staff for review and that they should receive a response within 2 business days.   Please advise at Mobile [phone] (mobile)

## 2023-01-14 ENCOUNTER — Other Ambulatory Visit: Payer: Medicare HMO | Admitting: Pharmacist

## 2023-01-14 ENCOUNTER — Telehealth: Payer: Self-pay | Admitting: Pharmacist

## 2023-01-14 NOTE — Progress Notes (Signed)
   01/14/2023 Name: Lisa Gutierrez MRN: [id number] DOB: [birth year]  Attempted to contact patient for scheduled appointment for medication management. Patient is not feeling well and requested reschedule. Reschedule for 01/16/23 at 2pm.  Lynnda Shields, PharmD, BCPS Clinical Pharmacist [facility]

## 2023-01-15 ENCOUNTER — Telehealth: Payer: Self-pay | Admitting: *Deleted

## 2023-01-15 NOTE — Telephone Encounter (Signed)
Pt informed of below.  

## 2023-01-15 NOTE — Telephone Encounter (Signed)
Pt calling with a question for provider, she wants to know if she should continue to take the janumet with her ozempic.  She said that she wanted to check before taking it anymore. Please advise.

## 2023-01-15 NOTE — Telephone Encounter (Signed)
Do not take Janumet.  Confirmed this with instructions written in AVS from most recent office visit and in MyChart messages in thread titled "my diabetes numbers" initiated on 12/02/2022.

## 2023-01-16 ENCOUNTER — Ambulatory Visit: Payer: Self-pay

## 2023-01-16 ENCOUNTER — Other Ambulatory Visit: Payer: Medicare HMO | Admitting: Pharmacist

## 2023-01-16 NOTE — Patient Outreach (Signed)
  Care Coordination   Follow Up Visit Note   01/16/2023 Name: Phala Schraeder MRN: [id number] DOB: Jun 23, 1950  Kabria Hetzer is a 72 y.o. year old female who sees [name], [place] for primary care. I spoke with  [name] by phone today.  What matters to the patients health and wellness today?  Ms. Ragin's blood pressure is doing well. Yesterday, it was 119 / 64. She is stable with her diabetes. She feels that the venlafaxine that she's taking is causing her to be angry and have nightmares. She plans to talk with her counselor and her psychiatrist about the medication to see about getting it changed; I told her I felt that was a good course of action.      Goals Addressed             This Visit's Progress    I want help to understand my conditions so I can prepare for the furture       Patient Goals/Self Care Activities: -Patient/Caregiver will self-administer medications as prescribed as evidenced by self-report/primary caregiver report  -Patient/Caregiver will attend all scheduled provider appointments as evidenced by clinician review of documented attendance to scheduled appointments and patient/caregiver report -Patient/Caregiver will call pharmacy for medication refills as evidenced by patient report and review of pharmacy fill history as appropriate -Patient/Caregiver will call provider office for new concerns or questions as evidenced by review of documented incoming telephone call notes and patient report -Patient/Caregiver will focus on medication adherence by taking medications as prescribed  -Checks BP and records as discussed -check blood sugar at prescribed times -record values and write them down take them to all doctor visits   -Take the medications prescribed to control your heart rate and rhythm and reduce your risk of blood clot formation (anticoagulants or antiplatelet medications/"blood thinners"). -Learn to check your pulse and  write down the number every day.  -Learn what we discussed today about AFIB.  Don't get overwhelmed.  Read the information that I sent you about AFIB.  At our next talk we will discuss Hypertension. BP Readings from Last 3 Encounters:  01/01/23 129/80  12/27/22 123/85  11/20/22 (!) 140/78            SDOH assessments and interventions completed:  No     Care Coordination Interventions:  Yes, provided   Interventions Today    Flowsheet Row Most Recent Value  Chronic Disease   Chronic disease during today's visit Hypertension (HTN)  General Interventions   General Interventions Discussed/Reviewed General Interventions Discussed  Mental Health Interventions   Mental Health Discussed/Reviewed Mental Health Discussed  Safety Interventions   Safety Discussed/Reviewed Safety Discussed        Follow up plan: Follow up call scheduled for 02/15/23  145 pm    Encounter Outcome:  Patient Visit Completed   Juanell Fairly RN, BSN, [facility]   Care Coordinator Phone: [phone]

## 2023-01-16 NOTE — Progress Notes (Signed)
01/16/2023 Name: Lisa Gutierrez MRN: [id number] DOB: [birth year]  Chief Complaint  Patient presents with   Diabetes   Medication Assistance    Lisa Gutierrez is a 72 y.o. year old female who presented for a telephone visit.   They were referred to the pharmacist by their Case Management Team  for assistance in managing diabetes and complex medication management.   Subjective:  Care Team: Primary Care Provider: [name], PA   Medication Access/Adherence  [facility]  [phone] Rough, [place] - [address] [place] [address] Phone: [phone] Fax: [phone]  [facility] - [place], [place] - [address], [address], [address] [place] [address] Phone: [phone] Fax: [phone]  [facility] - [place], [place] - [address] [place] [address] Phone: [phone] Fax: [phone]   Diabetes:  Current medications: jardiance 25mg  daily, ozempic 0.25mg  weekly, first injection 01/15/23 and tolerating well today/thus far  Patient expressed concern with medication safety - wanted to review drug-drug interactions, how this medication will work, etc.  Current medication access support: ozempic via novonordisk in process   Objective:  Lab Results  Component Value Date   HGBA1C 9.1 12/27/2022    Lab Results  Component Value Date   CREATININE 0.69 12/04/2022   BUN 13 12/04/2022   NA 137 12/04/2022   K 4.4 12/04/2022   CL 98 12/04/2022   CO2 21 12/04/2022    Lab Results  Component Value Date   CHOL 195 08/24/2021   HDL 31 (L) 08/24/2021   LDLCALC 123 (H) 08/24/2021   TRIG 233 (H) 08/24/2021   CHOLHDL 6.3 (H) 08/24/2021    Medications Reviewed Today     Reviewed by Gabriel Carina, RPH (Pharmacist) on 01/16/23 at 1429  Med List Status: <None>   Medication Order Taking? Sig Documenting Provider Last Dose Status Informant   acetaminophen (TYLENOL) 500 MG tablet [id number] No Take 2 tablets by mouth as needed. [provider] Taking Active Self, Pharmacy Records           Med Note Luster Landsberg, TIFFANI S   Tue Jul 10, 2022  5:44 PM)    ALPRAZolam Prudy Feeler) 0.5 MG tablet [id number] No Take 1 tablet (0.5 mg total) by mouth at bedtime as needed for anxiety or sleep. [name], PA Taking Active   apixaban (ELIQUIS) 5 MG TABS tablet [id number] No Take 1 tablet (5 mg total) by mouth 2 (two) times daily. Iran Ouch, MD Taking Active   Bioflavonoid Products (VITAMIN C) CHEW [id number] No Chew by mouth daily. With vitamin D and elderberry. 2 chews daily [provider] Taking Active Self, Pharmacy Records  Blood Glucose Monitoring Suppl (ACCU-CHEK GUIDE) w/Device KIT [id number] No by Does not apply route. Check BG as directed [provider] Taking Active            Med Note Littie Deeds, CHERYL A   Sun Dec 09, 2022 10:31 AM) Accu check Guide meter and strips and Softclix lancets  Calcium Carbonate (CALCIUM 500 PO) [id number] No Take 500 mg by mouth 2 (two) times daily. [provider] Taking Active Self, Pharmacy Records  clobetasol ointment (TEMOVATE) 0.05 % [id number] No Apply topically 2 (two) times daily. Apply topically to affected twice daily until improved. Carlean Jews, NP Taking Active            Med Note (GENTRY, CHERYL A   Mon  Dec 03, 2022  4:43 PM) When needed  digoxin (LANOXIN) 0.25 MG tablet [id number] No TAKE 1 TABLET BY MOUTH EVERY DAY Hammock, Sheri, NP Taking Active   empagliflozin (JARDIANCE) 25 MG TABS tablet [id number] No Take 1 tablet (25 mg total) by mouth daily before breakfast. [name], PA Taking Active            Med Note Littie Deeds, CHERYL A   Mon Dec 03, 2022  4:44 PM)    Flaxseed, Linseed, (FLAXSEED OIL PO) [id number] No Take 1,500 mg by mouth 2 (two) times daily. [provider] Taking Active Self, Pharmacy Records  furosemide (LASIX) 20 MG  tablet [id number] No TAKE 1 TABLET BY MOUTH EVERY DAY Hammock, Sheri, NP Taking Active            Med Note Littie Deeds, CHERYL A   Mon Dec 03, 2022  4:45 PM) If needed for swelling/fuild retention  LINZESS 145 MCG CAPS capsule [id number]  Take 1 capsule (145 mcg total) by mouth daily. [name], PA  Active   losartan (COZAAR) 25 MG tablet [id number] No Take 1 tablet (25 mg total) by mouth daily. Charlsie Quest, NP Taking Expired 12/03/22 2359   metoprolol tartrate (LOPRESSOR) 50 MG tablet [id number] No TAKE 1.5 TABLETS BY MOUTH 2 TIMES DAILY. Charlsie Quest, NP Taking Active   montelukast (SINGULAIR) 10 MG tablet [id number] No Take 1 tablet (10 mg total) by mouth at bedtime. [name], PA Taking Active   Multiple Vitamins-Minerals (MULTIVITAMIN ADULT PO) [id number] No Take 1 tablet by mouth daily. [provider] Taking Active Self, Pharmacy Records  pantoprazole (PROTONIX) 40 MG tablet [id number] No TAKE 1 TABLET BY MOUTH TWICE A DAY Boscia, Heather E, NP Taking Active   Semaglutide,0.25 or 0.5MG /DOS, (OZEMPIC, 0.25 OR 0.5 MG/DOSE,) 2 MG/3ML SOPN [id number] No Inject 0.25 mg into the skin once a week. [name], PA Taking Active   venlafaxine XR (EFFEXOR-XR) 150 MG 24 hr capsule [id number] No Take 1 capsule (150 mg total) by mouth daily with breakfast. [name], PA Taking Active   venlafaxine XR (EFFEXOR-XR) 37.5 MG 24 hr capsule [id number] No Take 1 capsule (37.5 mg total) by mouth daily with breakfast. [name], PA Taking Active              Assessment/Plan:   Diabetes: - Currently uncontrolled, started ozempic with goal of titrating dose as tolerated to optimize glucose control, weight management, and CV benefits. - Reviewed long term cardiovascular and renal outcomes of uncontrolled blood sugar - Reviewed goal A1c, goal fasting, and goal 2 hour post prandial glucose - Completed drug-drug interaction review of full medication list, explained in depth  to patient - Reviewed ozempic dosing, side effects, mechanism of action, and benefits. - Recommend to continue ozempic 0.25mg  weekly.  - Recommend to check glucose per current routine - Meets financial criteria for ozempic patient assistance program through novonordisk. Jill Simcox CPhT has already sent patient paperwork for assistance, will follow along for progress - patient states she has received and needs to confirm with spouse it was mailed back.   Follow Up Plan: 1 week per patient request to assess ozempic tolerance  Lynnda Shields, PharmD, BCPS Clinical Pharmacist [facility]

## 2023-01-16 NOTE — Patient Instructions (Signed)
Lisa Gutierrez,  I am glad ozempic is going well so far. I will touch base with you on Monday to check in with you about medications.  Take care, Lisa Gutierrez, PharmD, BCPS Clinical Pharmacist [facility]

## 2023-01-16 NOTE — Patient Instructions (Signed)
Visit Information  Thank you for taking time to visit with me today. Please don't hesitate to contact me if I can be of assistance to you.   Following are the goals we discussed today:   Goals Addressed             This Visit's Progress    I want help to understand my conditions so I can prepare for the furture       Patient Goals/Self Care Activities: -Patient/Caregiver will self-administer medications as prescribed as evidenced by self-report/primary caregiver report  -Patient/Caregiver will attend all scheduled provider appointments as evidenced by clinician review of documented attendance to scheduled appointments and patient/caregiver report -Patient/Caregiver will call pharmacy for medication refills as evidenced by patient report and review of pharmacy fill history as appropriate -Patient/Caregiver will call provider office for new concerns or questions as evidenced by review of documented incoming telephone call notes and patient report -Patient/Caregiver will focus on medication adherence by taking medications as prescribed  -Checks BP and records as discussed -check blood sugar at prescribed times -record values and write them down take them to all doctor visits   -Take the medications prescribed to control your heart rate and rhythm and reduce your risk of blood clot formation (anticoagulants or antiplatelet medications/"blood thinners"). -Learn to check your pulse and write down the number every day.  -Learn what we discussed today about AFIB.  Don't get overwhelmed.  Read the information that I sent you about AFIB.  At our next talk we will discuss Hypertension. BP Readings from Last 3 Encounters:  01/01/23 129/80  12/27/22 123/85  11/20/22 (!) 140/78            Our next appointment is by telephone on 02/15/23 at 145 pm  Please call the care guide team at [phone] if you need to cancel or reschedule your appointment.   If you are experiencing a Mental Health  or Behavioral Health Crisis or need someone to talk to, please call 1-800-273-TALK (toll free, 24 hour hotline)  Patient verbalizes understanding of instructions and care plan provided today and agrees to view in MyChart. Active MyChart status and patient understanding of how to access instructions and care plan via MyChart confirmed with patient.     Juanell Fairly RN, BSN, [facility] Phone: [phone]

## 2023-01-21 ENCOUNTER — Inpatient Hospital Stay: Payer: Medicare HMO | Attending: Oncology | Admitting: Oncology

## 2023-01-21 ENCOUNTER — Other Ambulatory Visit: Payer: Self-pay | Admitting: Family Medicine

## 2023-01-21 ENCOUNTER — Ambulatory Visit: Payer: Medicare HMO | Admitting: Oncology

## 2023-01-21 ENCOUNTER — Other Ambulatory Visit: Payer: Medicare HMO | Admitting: Pharmacist

## 2023-01-21 ENCOUNTER — Telehealth: Payer: Self-pay | Admitting: *Deleted

## 2023-01-21 ENCOUNTER — Telehealth: Payer: Self-pay | Admitting: Pharmacist

## 2023-01-21 ENCOUNTER — Ambulatory Visit: Payer: Self-pay | Admitting: *Deleted

## 2023-01-21 DIAGNOSIS — E1165 Type 2 diabetes mellitus with hyperglycemia: Secondary | ICD-10-CM

## 2023-01-21 DIAGNOSIS — D72828 Other elevated white blood cell count: Secondary | ICD-10-CM

## 2023-01-21 DIAGNOSIS — R112 Nausea with vomiting, unspecified: Secondary | ICD-10-CM

## 2023-01-21 MED ORDER — METFORMIN HCL ER 500 MG PO TB24
500.0000 mg | ORAL_TABLET | Freq: Two times a day (BID) | ORAL | 1 refills | Status: DC
Start: 2023-01-21 — End: 2023-03-13

## 2023-01-21 MED ORDER — ONDANSETRON HCL 4 MG PO TABS: 4.0000 mg | ORAL_TABLET | Freq: Three times a day (TID) | ORAL | 0 refills | Status: DC | PRN

## 2023-01-21 NOTE — Progress Notes (Signed)
Received the following message, will prescribe metformin 500 mg twice daily for blood glucose control until Ozempic is titrated up to maintenance dose.  Gabriel Carina, RPH  [name], [place] Berniece Andreas contacted me to ask about resuming use of janumet, she has only had a single injection of ozempic 0.25mg  weekly, and was concerned about BG elevations, 190s fasting which she states is higher for her. Because ozempic doesn't have glycemic control at the initial 0.25mg  dosing, and she will have 3 more weeks of it, can you prescribe metformin 500mg  BID for interim BG control until ozempic is titrated up? Many folks use ozempic and metformin simultaneously and do ok, though there is a risk of more GI effects. These can be mitigated by use of metformin alone (instead of combination janumet), and I have counseled patient on the possibility of diarrhea/GI upset. She states she has IBS more on constipation side, and "would welcome more regularity." Thanks, Thurston Hole

## 2023-01-21 NOTE — Patient Instructions (Signed)
Visit Information  Thank you for taking time to visit with me today. Please don't hesitate to contact me if I can be of assistance to you.   Following are the goals we discussed today:   Goals Addressed             This Visit's Progress    Reduce symptoms related to depression and  anxiety       Activities and task to complete in order to accomplish goals.   Attend visit to Psychiatry for medication review/changes  Review and consider the additional support resources mailed to you Call to inquire about your recent/previous counselor plans to connect you with new counselor Find ways to relax and reduce your anxiety- email with material being sent today Find ways to remove yourself from conversations that are upsetting  Call your insurance provider for more information about your Enhanced Benefits ( ) Start / continue relaxed breathing 3 times daily Keep all upcoming appointment discussed today Continue with compliance of taking medication prescribed by Doctor Consider plans for on-going plans for behavioral health care as discussed Call [phone] for mental health support/counselor 24/7   Call 911 for emergent need Consider local Hospice agency for free grief counseling and support         Our next appointment is by telephone on 02/11/23  Please call the care guide team at [phone] if you need to cancel or reschedule your appointment.   If you are experiencing a Mental Health or Behavioral Health Crisis or need someone to talk to, please call the Suicide and Crisis Lifeline: 988 call 911   The patient verbalized understanding of instructions, educational materials, and care plan provided today and DECLINED offer to receive copy of patient instructions, educational materials, and care plan.   Telephone follow up appointment with care management team member scheduled for: 02/11/23  Reece Levy, MSW, LCSW Clinical Social Worker [phone]

## 2023-01-21 NOTE — Patient Outreach (Signed)
  Care Coordination   Follow Up Visit Note   01/21/2023 Name: Lisa Gutierrez MRN: [id number] DOB: 10-16-1950  Lisa Gutierrez is a 72 y.o. year old female who sees [name], [place] for primary care. I spoke with  [name] by phone today.  What matters to the patients health and wellness today?  Going to see Psychiatrist next week for assistance    Goals Addressed             This Visit's Progress    Reduce symptoms related to depression and  anxiety       Activities and task to complete in order to accomplish goals.   Attend visit to Psychiatry for medication review/changes  Review and consider the additional support resources mailed to you Call to inquire about your recent/previous counselor plans to connect you with new counselor Find ways to relax and reduce your anxiety- email with material being sent today Find ways to remove yourself from conversations that are upsetting  Call your insurance provider for more information about your Enhanced Benefits ( ) Start / continue relaxed breathing 3 times daily Keep all upcoming appointment discussed today Continue with compliance of taking medication prescribed by Doctor Consider plans for on-going plans for behavioral health care as discussed Call [phone] for mental health support/counselor 24/7   Call 911 for emergent need Consider local Hospice agency for free grief counseling and support         SDOH assessments and interventions completed:  Yes     Care Coordination Interventions:  Yes, provided  Interventions Today    Flowsheet Row Most Recent Value  Chronic Disease   Chronic disease during today's visit Other  [depression/anxiety]  General Interventions   General Interventions Discussed/Reviewed General Interventions Discussed, Doctor Visits  Doctor Visits Discussed/Reviewed Specialist  [Pt reports she has scheduled a visit with her Psychiatrist and plans to discuss treatment  plan/RX's with her]  Mental Health Interventions   Mental Health Discussed/Reviewed Mental Health Discussed, Mental Health Reviewed, Depression, Anxiety  [Pt has scheduled a visit with her Psychiatrist, Dr Vanetta Shawl, for 01/30/23]       Follow up plan: Follow up call scheduled for 02/11/23    Encounter Outcome:  Patient Visit Completed

## 2023-01-21 NOTE — Telephone Encounter (Signed)
Pt calling stated that she was having diarrhea and vomiting today.  She mentioned that her numbers are still high. Told her to  use the nausea medicine that was sent in and take her next dose of ozempic tomorrow if she is feeling better if not to take it in a day or two when she feels better. She will send a message and let provider know how she is feeling. Routing to PCP as an [place]

## 2023-01-21 NOTE — Progress Notes (Signed)
Care Coordination Call  Answered patient's additional incoming call this afternoon, she reports she is suddenly not feeling well. She has upset stomach, vomiting. Denies fever or respiratory symptoms. She is trying to check blood glucose, blood pressure, and temperature.  She is tearful, she explained she has contacted her spouse for support. Provided counseling for medication management during sickness, encouraged fluids, rest. Provided instruction for urgent care evaluation if symptoms worsen. Patient verbalized understanding.  Lynnda Shields, PharmD, BCPS Clinical Pharmacist [facility]

## 2023-01-21 NOTE — Progress Notes (Signed)
   01/21/2023 Name: Lisa Gutierrez MRN: [id number] DOB: August 02, 1950  Chief Complaint  Patient presents with   Medication Assistance    Lisa Gutierrez is a 72 y.o. year old female who presented for a telephone visit. Answered incoming call for patient questions.   They were referred to the pharmacist by their Case Management Team  for assistance in managing diabetes and complex medication management.   Subjective:  Care Team: Primary Care Provider: [name], PA   Medication Access/Adherence  [facility]  [phone] [facility],  - [address] [place] [address] Phone: [phone] Fax: [phone]  [facility] - [place], [place] - [address], [address], [address] [place] [address] Phone: [phone] Fax: [phone]  [place], [place] - [address] [place] [address] Phone: [phone] Fax: [phone]   Diabetes:  Current medications: jardiance 25mg  daily, ozempic 0.25mg  weekly, first injection 01/15/23 and tolerating well thus far.   BG: 190s, patient expressed concerns with rising numbers  Patient expressed concern with medication safety and BG values.  Current medication access support: ozempic via novonordisk in process   Objective:  Lab Results  Component Value Date   HGBA1C 9.1 12/27/2022    Lab Results  Component Value Date   CREATININE 0.69 12/04/2022   BUN 13 12/04/2022   NA 137 12/04/2022   K 4.4 12/04/2022   CL 98 12/04/2022   CO2 21 12/04/2022    Lab Results  Component Value Date   CHOL 195 08/24/2021   HDL 31 (L) 08/24/2021   LDLCALC 123 (H) 08/24/2021   TRIG 233 (H) 08/24/2021   CHOLHDL 6.3 (H) 08/24/2021    Medications Reviewed Today   Medications were not reviewed in this encounter      Assessment/Plan:   Diabetes: - Currently uncontrolled, started ozempic with goal of  titrating dose as tolerated to optimize glucose control, weight management, and CV benefits. - Recommend to continue ozempic 0.25mg  weekly.  - Recommend to initiate metformin 500mg  BID for interim control of BG while still in dose-escalation of ozempic - Recommend to check glucose per current routine - Meets financial criteria for ozempic patient assistance program through novonordisk. Jill Simcox CPhT has already sent patient paperwork for assistance, will follow along for progress - patient states she has received and needs to confirm with spouse it was mailed back.   Follow Up Plan: 1-3 days per patient request to assess ozempic tolerance  Lynnda Shields, PharmD, BCPS Clinical Pharmacist [facility]

## 2023-01-22 ENCOUNTER — Encounter: Payer: Self-pay | Admitting: Cardiology

## 2023-01-22 ENCOUNTER — Other Ambulatory Visit: Payer: Medicare HMO | Admitting: Pharmacist

## 2023-01-22 ENCOUNTER — Ambulatory Visit: Payer: Medicare HMO | Attending: Cardiology | Admitting: Cardiology

## 2023-01-22 ENCOUNTER — Encounter: Payer: Self-pay | Admitting: Oncology

## 2023-01-22 VITALS — BP 136/78 | HR 99 | Ht 65.0 in | Wt 223.8 lb

## 2023-01-22 DIAGNOSIS — E1165 Type 2 diabetes mellitus with hyperglycemia: Secondary | ICD-10-CM | POA: Diagnosis not present

## 2023-01-22 DIAGNOSIS — E669 Obesity, unspecified: Secondary | ICD-10-CM

## 2023-01-22 DIAGNOSIS — I480 Paroxysmal atrial fibrillation: Secondary | ICD-10-CM | POA: Diagnosis not present

## 2023-01-22 DIAGNOSIS — F411 Generalized anxiety disorder: Secondary | ICD-10-CM | POA: Diagnosis not present

## 2023-01-22 DIAGNOSIS — I10 Essential (primary) hypertension: Secondary | ICD-10-CM | POA: Diagnosis not present

## 2023-01-22 NOTE — Patient Instructions (Signed)
Medication Instructions:  No changes at this time.   *If you need a refill on your cardiac medications before your next appointment, please call your pharmacy*   Lab Work: None  If you have labs (blood work) drawn today and your tests are completely normal, you will receive your results only by: MyChart Message (if you have MyChart) OR A paper copy in the mail If you have any lab test that is abnormal or we need to change your treatment, we will call you to review the results.   Testing/Procedures: None   Follow-Up: At [facility], you and your health needs are our priority.  As part of our continuing mission to provide you with exceptional heart care, we have created designated Provider Care Teams.  These Care Teams include your primary Cardiologist (physician) and Advanced Practice Providers (APPs -  Physician Assistants and Nurse Practitioners) who all work together to provide you with the care you need, when you need it.   Your next appointment:   3 month(s)  Provider:   Muhammad Arida, MD or Sheri Hammock, NP    

## 2023-01-22 NOTE — Progress Notes (Signed)
Cardiology Office Note:  .   Date:  01/22/2023  ID:  Lisa Gutierrez, DOB 03-Feb-1951, MRN [id number] PCP: [name], PA  [facility] Providers Cardiologist:  Lorine Bears, MD Electrophysiologist:  Lanier Prude, MD    History of Present Illness: .   Lisa Gutierrez is a 72 y.o. female with a past medical history of essential hypertension, chronic electrolytes, gastroesophageal reflux disease, IBS with constipation, chronic superficial gastritis without bleeding, fatty liver disease, type 2 diabetes, GAD, insomnia, chronic history of statin intolerance, morbid obesity, persistent atrial fibrillation, who is here today for follow-up.  She had previously presented for cardiac surgery on 07/10/2022 was found to be in atrial fibrillation with RVR by anesthesia.  Heart rate was 1 70-1 9 past time and procedure was subsequently canceled.  Echocardiogram revealed an LVEF of 50-55%, no RWMA, no valvular abnormalities were noted.  She was started on metoprolol, dig, and apixaban and subsequently discharged on 07/04/2022.  She followed up in clinic on July 16, 2022 where she been tolerating her apixaban and beta-blockers without incident she was scheduled for cardioversion procedure after being on anticoagulant with 4 weeks uninterrupted.  She did previously undergone cardioversion that was successful for generalized vision she was noted to be back in atrial fibrillation.  She was last seen in clinic 11/30/2022 she continued to be tearful with complaints of being depressed, weak, fatigued, anxious, with multiple questions about her medications.  She was continued on apixaban for CHA2DS2-VASc of at least 5 for stroke prophylaxis.  EKG revealed she was still in atrial fibrillation.  She was apprehensive about repeat cardioversion she was referred to EP.  She returns to clinic today in a better mood than when she is usually here.  She continues to have complaints of depression,  weakness, fatigue, shortness of breath, being anxious, had multiple questions about following up with EP.  She had previously undergone cardioversion that was unfortunately shortly after.  She continues to be apprehensive about repeat cardioversion which is negated a referral to EP.  She has been compliant with her current medication regimen and has not had any issues with her apixaban and has noted no bleeding with no blood noted in her stool or urine.  She continues to have issues with her blood sugars and has been following closely with her PCP with multiple medication changes.  She denies any recent hospitalizations or visits to the emergency department.  ROS: 10 point review of system was completed today and considered negative with exception what is been listed in the HPI  Studies Reviewed: Marland Kitchen   EKG Interpretation Date/Time:  Tuesday January 22 2023 11:49:12 EDT Ventricular Rate:  99 PR Interval:    QRS Duration:  82 QT Interval:  352 QTC Calculation: 451 R Axis:   -40  Text Interpretation: Atrial fibrillation Left axis deviation Nonspecific T wave abnormality When compared with ECG of 20-Nov-2022 10:46, No significant change was found Confirmed by Charlsie Quest ([id number]) on 01/22/2023 11:54:05 AM    DCCV 09/03/22 The patient had the defibrillator pads placed in the anterior and posterior position. Once an appropriate level of sedation was confirmed, the patient was cardioverted x 2 with 200J of biphasic synchronized energy.  The patient converted to NSR.  There were no apparent complications.  The patient had normal neuro status and respiratory status post procedure with vitals stable as recorded elsewhere.  Adequate airway was maintained throughout and vital signs monitored per protocol.    TTE 07/10/22  1. Left ventricular ejection fraction, by estimation, is 50 to 55%. The  left ventricle has low normal function. The left ventricle has no regional  wall motion abnormalities. There is mild  left ventricular hypertrophy.  Left ventricular diastolic  parameters are indeterminate.   2. Right ventricular systolic function is normal. The right ventricular  size is normal. Tricuspid regurgitation signal is inadequate for assessing  PA pressure.   3. Left atrial size was moderately dilated.   4. The mitral valve is normal in structure. No evidence of mitral valve  regurgitation. No evidence of mitral stenosis.   5. The aortic valve is normal in structure. Aortic valve regurgitation is  not visualized. No aortic stenosis is present.  Risk Assessment/Calculations:    CHA2DS2-VASc Score = 4   This indicates a 4.8% annual risk of stroke. The patient's score is based upon: CHF History: 0 HTN History: 1 Diabetes History: 1 Stroke History: 0 Vascular Disease History: 0 Age Score: 1 Gender Score: 1            Physical Exam:   VS:  BP 136/78 (BP Location: Left Arm, Patient Position: Sitting, Cuff Size: Normal)   Pulse 99   Ht 5\' 5"  (1.651 m)   Wt 223 lb 12.8 oz (101.5 kg)   SpO2 97%   BMI 37.24 kg/m    Wt Readings from Last 3 Encounters:  01/22/23 223 lb 12.8 oz (101.5 kg)  01/01/23 228 lb 9.6 oz (103.7 kg)  12/27/22 227 lb (103 kg)    GEN: Well nourished, well developed in no acute distress NECK: No JVD; No carotid bruits CARDIAC: IR IR, no murmurs, rubs, gallops RESPIRATORY:  Clear to auscultation without rales, wheezing or rhonchi  ABDOMEN: Soft, non-tender, non-distended EXTREMITIES:  Trace pretibial edema; No deformity   ASSESSMENT AND PLAN: .   Paroxysmal atrial fibrillation status post DCCV.  EKG today reveals the patient is still in rate controlled atrial fibrillation.  She is continued on apixaban 5 mg twice daily for CHA2DS2-VASc score of at least 5 for stroke prophylaxis.  She is also continued on digoxin 0.25 mg daily and metoprolol to tartrate 75 mg twice daily.  She continues to have complaints of fatigue, anxiety, shortness of breath, and dizziness.  She  states that after recent cardioversion all of her symptoms had resolved but she was still in a sinus rhythm but since being back in atrial fibrillation symptoms continue.  She has her appointment with the EP scheduled for tomorrow.  Primary hypertension with blood pressure today 136/78.  Blood pressure has been controlled on her current regimen.  She is continued on furosemide as needed, losartan 25 mg daily, metoprolol tartrate 75 mg twice daily.  She has been maintaining a blood pressure log at home 1 to 2 hours postmedication administration.  GAD which she is on several medications have been titrated by psychiatry.  She continues to be followed by psychiatry.  Type 2 diabetes with blood sugars she states that event out-of-control as of late.  PCP is increased her medications has been placed on several different regimens.  Obesity with a BMI of 37.4.  She is encouraged to continue with her activity and decrease caloric intake.  With her depression and anxiety it is difficult to get her to be more active at this time and complicates her overall treatment.       Dispo: Patient to return to clinic to see MD/APP in 3 months or sooner if needed  Signed, Vincenza Dail,  NP

## 2023-01-22 NOTE — Progress Notes (Signed)
01/22/2023 Name: Lisa Gutierrez MRN: [id number] DOB: 18-May-1950  Chief Complaint  Patient presents with   Diabetes   Medication Assistance    Lisa Gutierrez is a 72 y.o. year old female who presented for a telephone visit.   They were referred to the pharmacist by their Case Management Team  for assistance in managing diabetes and medication access. Answered incoming call for medication questions   Subjective:  Care Team: Primary Care Provider: [name], PA  Medication Access/Adherence  Current Pharmacy:  [facility] [phone] [place], [place] - [address] [place] [address] Phone: [phone] Fax: [phone]  [facility] - [place], [place] - [address], [address], [address] [place] [address] Phone: [phone] Fax: [phone]  [place], [place] - [address] [place] [address] Phone: [phone] Fax: [phone]    Diabetes:  Current medications: ozempic 0.25mg  weekly, jardiance 25mg  daily Medications tried in the past: jaumet (discontinued by PCP when starting ozempic)  Current glucose readings: 190s  Medication access: ozempic via novonordisk in progress via Noreene Larsson Simcox CPhT   Objective:  Lab Results  Component Value Date   HGBA1C 9.1 12/27/2022    Lab Results  Component Value Date   CREATININE 0.69 12/04/2022   BUN 13 12/04/2022   NA 137 12/04/2022   K 4.4 12/04/2022   CL 98 12/04/2022   CO2 21 12/04/2022    Lab Results  Component Value Date   CHOL 195 08/24/2021   HDL 31 (L) 08/24/2021   LDLCALC 123 (H) 08/24/2021   TRIG 233 (H) 08/24/2021   CHOLHDL 6.3 (H) 08/24/2021    Medications Reviewed Today     Reviewed by Gabriel Carina, RPH (Pharmacist) on 01/22/23 at 1710  Med List Status: <None>   Medication Order Taking? Sig Documenting Provider Last Dose Status Informant  acetaminophen (TYLENOL) 500 MG  tablet [id number] No Take 2 tablets by mouth as needed. [provider] Taking Active Self, Pharmacy Records           Med Note Luster Landsberg, TIFFANI S   Tue Jul 10, 2022  5:44 PM)    ALPRAZolam Prudy Feeler) 0.5 MG tablet [id number] No Take 1 tablet (0.5 mg total) by mouth at bedtime as needed for anxiety or sleep. [name], PA Taking Active   apixaban (ELIQUIS) 5 MG TABS tablet [id number] No Take 1 tablet (5 mg total) by mouth 2 (two) times daily. Iran Ouch, MD Taking Active   Bioflavonoid Products (VITAMIN C) CHEW [id number] No Chew by mouth daily. With vitamin D and elderberry. 2 chews daily [provider] Taking Active Self, Pharmacy Records  Blood Glucose Monitoring Suppl (ACCU-CHEK GUIDE) w/Device KIT [id number] No by Does not apply route. Check BG as directed [provider] Taking Active            Med Note Littie Deeds, CHERYL A   Sun Dec 09, 2022 10:31 AM) Accu check Guide meter and strips and Softclix lancets  Calcium Carbonate (CALCIUM 500 PO) [id number] No Take 500 mg by mouth 2 (two) times daily. [provider] Taking Active Self, Pharmacy Records  clobetasol ointment (TEMOVATE) 0.05 % [id number] No Apply topically 2 (two) times daily. Apply topically to affected twice daily until improved. Carlean Jews, NP Taking Active            Med Note Sharlee Blew Dec 03, 2022  4:43 PM) When needed  digoxin (LANOXIN) 0.25 MG tablet [id number] No TAKE 1 TABLET BY MOUTH EVERY DAY Hammock, Sheri, NP Taking Active   empagliflozin (JARDIANCE) 25 MG TABS tablet [id number] No Take 1 tablet (25 mg total) by mouth daily before breakfast. [name], PA Taking Active            Med Note Littie Deeds, CHERYL A   Mon Dec 03, 2022  4:44 PM)    Flaxseed, Linseed, (FLAXSEED OIL PO) [id number] No Take 1,500 mg by mouth 2 (two) times daily. [provider] Taking Active Self, Pharmacy Records  furosemide (LASIX) 20 MG tablet [id number] No TAKE 1 TABLET BY  MOUTH EVERY DAY Hammock, Sheri, NP Taking Active            Med Note Littie Deeds, CHERYL A   Mon Dec 03, 2022  4:45 PM) If needed for swelling/fuild retention  LINZESS 145 MCG CAPS capsule [id number] No Take 1 capsule (145 mcg total) by mouth daily. [name], PA Taking Active   losartan (COZAAR) 25 MG tablet [id number] No Take 1 tablet (25 mg total) by mouth daily. Charlsie Quest, NP Taking Active   metFORMIN (GLUCOPHAGE-XR) 500 MG 24 hr tablet [id number] No Take 1 tablet (500 mg total) by mouth 2 (two) times daily with a meal. [name] A, PA Taking Active   metoprolol tartrate (LOPRESSOR) 50 MG tablet [id number] No TAKE 1.5 TABLETS BY MOUTH 2 TIMES DAILY. Charlsie Quest, NP Taking Active   montelukast (SINGULAIR) 10 MG tablet [id number] No Take 1 tablet (10 mg total) by mouth at bedtime. [name], PA Taking Active   Multiple Vitamins-Minerals (MULTIVITAMIN ADULT PO) [id number] No Take 1 tablet by mouth daily. [provider] Taking Active Self, Pharmacy Records  ondansetron (ZOFRAN) 4 MG tablet [id number] No Take 1 tablet (4 mg total) by mouth every 8 (eight) hours as needed for nausea or vomiting. Creig Hines, MD Taking Active   pantoprazole (PROTONIX) 40 MG tablet [id number] No TAKE 1 TABLET BY MOUTH TWICE A DAY Boscia, Heather E, NP Taking Active   Semaglutide,0.25 or 0.5MG /DOS, (OZEMPIC, 0.25 OR 0.5 MG/DOSE,) 2 MG/3ML SOPN [id number] No Inject 0.25 mg into the skin once a week. [name], PA Taking Active   venlafaxine XR (EFFEXOR-XR) 150 MG 24 hr capsule [id number] No Take 1 capsule (150 mg total) by mouth daily with breakfast. [name], PA Taking Active   venlafaxine XR (EFFEXOR-XR) 37.5 MG 24 hr capsule [id number] No Take 1 capsule (37.5 mg total) by mouth daily with breakfast. [name], PA Taking Active               Assessment/Plan:   Diabetes: - Currently uncontrolled, but adjusting medications - Reviewed goal A1c, goal fasting,  and goal 2 hour post prandial glucose - Recommend to initiate metformin 500mg  BID, collaborated with PCP. Will continue other DM medications (ozempic 0.25mg  weekly, jardiance 25mg  daily)  - Meets financial criteria for ozempic patient assistance program through novonordisk. Will collaborate with provider, CPhT, and patient to pursue assistance. In process via [employer].    Follow Up Plan: 1 week to assess BG with initiating metformin while in dose-escalation phase of ozempic  Lynnda Shields, PharmD, BCPS Clinical Pharmacist [facility]

## 2023-01-22 NOTE — Progress Notes (Deleted)
  Electrophysiology Office Note:    Date:  01/22/2023   ID:  Lisa Gutierrez, DOB [birth year], MRN [id number]  [facility] Cardiologist:  Lorine Bears, MD  [facility] Electrophysiologist:  Lanier Prude, MD   Referring MD: Charlsie Quest, NP   Chief Complaint: Atrial fibrillation  History of Present Illness:    Lisa Gutierrez is a 72 y.o. femalewho I am seeing today for an evaluation of atrial fibrillation at the request of Lisa Camara, NP.  The patient was last seen by [facility] on January 22, 2023.  The patient has a medical history that includes hypertension, GERD, fatty liver disease, diabetes, anxiety, insomnia, statin intolerance, morbid obesity, persistent atrial fibrillation.  She is on Eliquis for stroke prophylaxis.  Her atrial fibrillation was diagnosed July 10, 2022 when she presented for cataract surgery.  The procedure was canceled.  She has been seen in clinic multiple times for her arrhythmia.  Anxiety seems to be complicating her management.  At the last appointment with Cordelia Pen, she was still in rate controlled atrial fibrillation.  She is highly symptomatic while out of rhythm.          Their past medical, social and family history was reveiwed.   ROS:   Please see the history of present illness.    All other systems reviewed and are negative.  EKGs/Labs/Other Studies Reviewed:    The following studies were reviewed today:  July 11, 2022 echo EF 50 to 55% RV normal Moderately dilated left atrium   July 10, 2022 EKG shows atrial fibrillation with rapid ventricular rates        Physical Exam:    VS:  There were no vitals taken for this visit.    Wt Readings from Last 3 Encounters:  01/22/23 223 lb 12.8 oz (101.5 kg)  01/01/23 228 lb 9.6 oz (103.7 kg)  12/27/22 227 lb (103 kg)     GEN: *** Well nourished, well developed in no acute distress CARDIAC: ***RRR, no murmurs, rubs, gallops RESPIRATORY:  Clear to  auscultation without rales, wheezing or rhonchi       ASSESSMENT AND PLAN:    1. Persistent atrial fibrillation (HCC)     #Persistent atrial fibrillation #High risk drug monitoring-digoxin Symptomatic. Continue Eliquis for stroke prophylaxis Continue metoprolol, transition to metoprolol succinate 100 mg by mouth twice daily*** Continue digoxin I discussed treatment options with the patient during today's clinic appointment.  We discussed conservative medical management, antiarrhythmic drugs and catheter ablation.  Discussed treatment options today for AF including antiarrhythmic drug therapy and ablation. Discussed risks, recovery and likelihood of success with each treatment strategy. Risk, benefits, and alternatives to EP study and ablation for afib were discussed. These risks include but are not limited to stroke, bleeding, vascular damage, tamponade, perforation, damage to the esophagus, lungs, phrenic nerve and other structures, pulmonary vein stenosis, worsening renal function, coronary vasospasm and death.  Discussed potential need for repeat ablation procedures and antiarrhythmic drugs after an initial ablation. The patient understands these risk and wishes to proceed.  We will therefore proceed with catheter ablation at the next available time.  Carto, ICE, anesthesia are requested for the procedure.  Will also obtain CT PV protocol prior to the procedure to exclude LAA thrombus and further evaluate atrial anatomy.  Check digoxin level      Signed, Sheria Lang T. Lalla Brothers, MD, [facility] 01/22/2023 8:26 PM    Electrophysiology [facility]

## 2023-01-22 NOTE — Progress Notes (Signed)
I connected with Lisa Gutierrez on 01/22/23 at  3:15 PM EDT by video enabled telemedicine visit and verified that I am speaking with the correct person using two identifiers.   I discussed the limitations, risks, security and privacy concerns of performing an evaluation and management service by telemedicine and the availability of in-person appointments. I also discussed with the patient that there may be a patient responsible charge related to this service. The patient expressed understanding and agreed to proceed.  Other persons participating in the visit and their role in the encounter:  none  Patient's location:  home Provider's location:  work  Stage manager Complaint: Discuss results of blood work  Diagnosis: Leukocytosis/neutrophilia likely reactive  History of present illness: Patient is a 72 year old female with past medical history significant for hypertension hyperlipidemia type 2 diabetes atrial fibrillation and referred for leukocytosis.  White cell count was elevated at 11.6 on 12/04/2022.  Differential showed mild neutrophilia with an absolute neutrophil count of 8.5 and monocytosis with a monocyte count of 1.1.  Hemoglobin and platelets have been normal.  Patient may be needing ablation for atrial fibrillation soon.  She denies any specific complaints at this time other than chronic fatigue.   CBC from 01/01/2023 showed a mildly elevated white count of 10.8 mainly with neutrophilia with an absolute neutrophil count of 7.8 and monocytosis of 1.1.  Peripheral flow cytometry did not show any evidence of immunophenotypic abnormalities.  Interval history patient ate a salad this morning and since then she has been having episodes of nausea and vomiting and feels fatigued.  She has not been able to eat or drink much since this morning.   Review of Systems  Constitutional:  Negative for chills, fever, malaise/fatigue and weight loss.  HENT:  Negative for congestion, ear discharge and  nosebleeds.   Eyes:  Negative for blurred vision.  Respiratory:  Negative for cough, hemoptysis, sputum production, shortness of breath and wheezing.   Cardiovascular:  Negative for chest pain, palpitations, orthopnea and claudication.  Gastrointestinal:  Positive for nausea and vomiting. Negative for abdominal pain, blood in stool, constipation, diarrhea, heartburn and melena.  Genitourinary:  Negative for dysuria, flank pain, frequency, hematuria and urgency.  Musculoskeletal:  Negative for back pain, joint pain and myalgias.  Skin:  Negative for rash.  Neurological:  Negative for dizziness, tingling, focal weakness, seizures, weakness and headaches.  Endo/Heme/Allergies:  Does not bruise/bleed easily.  Psychiatric/Behavioral:  Negative for depression and suicidal ideas. The patient does not have insomnia.     Allergies  Allergen Reactions   Aspirin Other (See Comments)    GI upset   Ibuprofen Other (See Comments)    GERD   Levofloxacin Other (See Comments)    Weight gain   Meloxicam Other (See Comments)    GI upset   Morphine Other (See Comments)   Naprosyn  [Naproxen] Other (See Comments)    GI upset   Nsaids Other (See Comments)    GI upset   Propofol Other (See Comments)    Had horrible nightmares   Quinolones    Robinul  [Glycopyrrolate] Nausea And Vomiting   Penicillin V Potassium Rash   Sulfa Antibiotics Rash    Past Medical History:  Diagnosis Date   Anxiety    Atrial fibrillation (HCC)    Depression    Depression    Phreesia 07/02/2020   Diabetes mellitus without complication (HCC)    GERD (gastroesophageal reflux disease)    Hypertension    IBS (irritable bowel syndrome)  Neuromuscular disorder [facility])     Past Surgical History:  Procedure Laterality Date   BREAST EXCISIONAL BIOPSY     CARDIOVERSION N/A 09/03/2022   Procedure: CARDIOVERSION;  Surgeon: Iran Ouch, MD;  Location: [facility] ORS;  Service: Cardiovascular;  Laterality: N/A;   CATARACT  EXTRACTION W/PHACO Right 07/24/2022   Procedure: CATARACT EXTRACTION PHACO AND INTRAOCULAR LENS PLACEMENT (IOC) RIGHT DIABETIC  5.93  00:42.5;  Surgeon: Galen Manila, MD;  Location: [facility];  Service: Ophthalmology;  Laterality: Right;   CATARACT EXTRACTION W/PHACO Left 08/07/2022   Procedure: CATARACT EXTRACTION PHACO AND INTRAOCULAR LENS PLACEMENT (IOC) LEFT DIABETIC  12.33  00:58.3;  Surgeon: Galen Manila, MD;  Location: [facility];  Service: Ophthalmology;  Laterality: Left;   CESAREAN SECTION N/A    Phreesia 07/02/2020   CHOLECYSTECTOMY     EXCISION / BIOPSY BREAST / NIPPLE / DUCT Right 1973   duct removed   SPINE SURGERY N/A    Phreesia 07/02/2020   TUBAL LIGATION N/A    Phreesia 07/02/2020    Social History   Socioeconomic History   Marital status: Married    Spouse name: Not on file   Number of children: Not on file   Years of education: Not on file   Highest education level: Not on file  Occupational History   Not on file  Tobacco Use   Smoking status: Former    Passive exposure: Past   Smokeless tobacco: Never  Vaping Use   Vaping status: Never Used  Substance and Sexual Activity   Alcohol use: No    Alcohol/week: 0.0 standard drinks of alcohol   Drug use: Never   Sexual activity: Not Currently    Partners: Male  Other Topics Concern   Not on file  Social History Narrative   Not on file   Social Determinants of Health   Financial Resource Strain: Low Risk  (10/15/2022)   Overall Financial Resource Strain (CARDIA)    Difficulty of Paying Living Expenses: Not very hard  Food Insecurity: No Food Insecurity (10/15/2022)   Hunger Vital Sign    Worried About Running Out of Food in the Last Year: Never true    Ran Out of Food in the Last Year: Never true  Transportation Needs: No Transportation Needs (08/09/2022)   PRAPARE - Administrator, Civil Service (Medical): No    Lack of Transportation (Non-Medical): No  Physical  Activity: Inactive (08/09/2022)   Exercise Vital Sign    Days of Exercise per Week: 0 days    Minutes of Exercise per Session: 0 min  Stress: Stress Concern Present (10/15/2022)   [employer] of Occupational Health - Occupational Stress Questionnaire    Feeling of Stress : Rather much  Social Connections: Socially Integrated (08/09/2022)   Social Connection and Isolation Panel [NHANES]    Frequency of Communication with Friends and Family: More than three times a week    Frequency of Social Gatherings with Friends and Family: More than three times a week    Attends Religious Services: More than 4 times per year    Active Member of [employer] or Organizations: Yes    Attends Banker Meetings: More than 4 times per year    Marital Status: Married  Catering manager Violence: Not At Risk (08/09/2022)   Humiliation, Afraid, Rape, and Kick questionnaire    Fear of Current or Ex-Partner: No    Emotionally Abused: No    Physically Abused: No  Sexually Abused: No    Family History  Problem Relation Age of Onset   Diabetes Mother    Hypertension Mother    Coronary artery disease Father    Glaucoma Father    Breast cancer Neg Hx      Current Outpatient Medications:    ondansetron (ZOFRAN) 4 MG tablet, Take 1 tablet (4 mg total) by mouth every 8 (eight) hours as needed for nausea or vomiting., Disp: 10 tablet, Rfl: 0   acetaminophen (TYLENOL) 500 MG tablet, Take 2 tablets by mouth as needed., Disp: , Rfl:    ALPRAZolam (XANAX) 0.5 MG tablet, Take 1 tablet (0.5 mg total) by mouth at bedtime as needed for anxiety or sleep., Disp: 30 tablet, Rfl: 2   apixaban (ELIQUIS) 5 MG TABS tablet, Take 1 tablet (5 mg total) by mouth 2 (two) times daily., Disp: 60 tablet, Rfl: 5   Bioflavonoid Products (VITAMIN C) CHEW, Chew by mouth daily. With vitamin D and elderberry. 2 chews daily, Disp: , Rfl:    Blood Glucose Monitoring Suppl (ACCU-CHEK GUIDE) w/Device KIT, by Does not apply route. Check  BG as directed, Disp: , Rfl:    Calcium Carbonate (CALCIUM 500 PO), Take 500 mg by mouth 2 (two) times daily., Disp: , Rfl:    clobetasol ointment (TEMOVATE) 0.05 %, Apply topically 2 (two) times daily. Apply topically to affected twice daily until improved., Disp: 30 g, Rfl: 2   digoxin (LANOXIN) 0.25 MG tablet, TAKE 1 TABLET BY MOUTH EVERY DAY, Disp: 30 tablet, Rfl: 1   empagliflozin (JARDIANCE) 25 MG TABS tablet, Take 1 tablet (25 mg total) by mouth daily before breakfast., Disp: 90 tablet, Rfl: 1   Flaxseed, Linseed, (FLAXSEED OIL PO), Take 1,500 mg by mouth 2 (two) times daily., Disp: , Rfl:    furosemide (LASIX) 20 MG tablet, TAKE 1 TABLET BY MOUTH EVERY DAY, Disp: 30 tablet, Rfl: 1   LINZESS 145 MCG CAPS capsule, Take 1 capsule (145 mcg total) by mouth daily., Disp: 30 capsule, Rfl: 2   losartan (COZAAR) 25 MG tablet, Take 1 tablet (25 mg total) by mouth daily., Disp: 90 tablet, Rfl: 3   metFORMIN (GLUCOPHAGE-XR) 500 MG 24 hr tablet, Take 1 tablet (500 mg total) by mouth 2 (two) times daily with a meal., Disp: 60 tablet, Rfl: 1   metoprolol tartrate (LOPRESSOR) 50 MG tablet, TAKE 1.5 TABLETS BY MOUTH 2 TIMES DAILY., Disp: 90 tablet, Rfl: 0   montelukast (SINGULAIR) 10 MG tablet, Take 1 tablet (10 mg total) by mouth at bedtime., Disp: 90 tablet, Rfl: 3   Multiple Vitamins-Minerals (MULTIVITAMIN ADULT PO), Take 1 tablet by mouth daily., Disp: , Rfl:    pantoprazole (PROTONIX) 40 MG tablet, TAKE 1 TABLET BY MOUTH TWICE A DAY, Disp: 180 tablet, Rfl: 1   Semaglutide,0.25 or 0.5MG /DOS, (OZEMPIC, 0.25 OR 0.5 MG/DOSE,) 2 MG/3ML SOPN, Inject 0.25 mg into the skin once a week., Disp: 3 mL, Rfl: 2   venlafaxine XR (EFFEXOR-XR) 150 MG 24 hr capsule, Take 1 capsule (150 mg total) by mouth daily with breakfast., Disp: 90 capsule, Rfl: 0   venlafaxine XR (EFFEXOR-XR) 37.5 MG 24 hr capsule, Take 1 capsule (37.5 mg total) by mouth daily with breakfast., Disp: 90 capsule, Rfl: 0  No results found.  No  images are attached to the encounter.      Latest Ref Rng & Units 12/04/2022    3:55 PM  CMP  Glucose 70 - 99 mg/dL 098   BUN 8 -  27 mg/dL 13   Creatinine 4.13 - 1.00 mg/dL 2.44   Sodium 010 - 272 mmol/L 137   Potassium 3.5 - 5.2 mmol/L 4.4   Chloride 96 - 106 mmol/L 98   CO2 20 - 29 mmol/L 21   Calcium 8.7 - 10.3 mg/dL 53.6   Total Protein 6.0 - 8.5 g/dL 6.8   Total Bilirubin 0.0 - 1.2 mg/dL 0.2   Alkaline Phos 44 - 121 IU/L 58   AST 0 - 40 IU/L 47   ALT 0 - 32 IU/L 44       Latest Ref Rng & Units 01/01/2023    2:22 PM  CBC  WBC 4.0 - 10.5 K/uL 10.8   Hemoglobin 12.0 - 15.0 g/dL 64.4   Hematocrit 03.4 - 46.0 % 42.5   Platelets 150 - 400 K/uL 242      Observation/objective: Appears in no acute distress over video visit today.  Breathing is nonlabored  Assessment and plan: Patient is a 72 year old female referred for leukocytosis/neutrophilia likely reactive  Patient has had mild leukocytosis over the last 1 to 2 yearsWith a white cell count that fluctuates between 10-12.  Differential has mainly shown neutrophilia with occasional monocytosis.  Peripheral flow cytometry did not show any evidence of immunophenotypic abnormality.  She therefore does not require any follow-up with hematology at this time.  She can be referred to [place] in the future if there is a consistent increase in her white cell count.  Nausea/vomiting possibly secondary to recent intake of salad.  I have given her 10 tablets of Zofran which she can use as needed  Follow-up instructions: No follow-up needed  I discussed the assessment and treatment plan with the patient. The patient was provided an opportunity to ask questions and all were answered. The patient agreed with the plan and demonstrated an understanding of the instructions.   The patient was advised to call back or seek an in-person evaluation if the symptoms worsen or if the condition fails to improve as anticipated.  I provided 11 minutes of  face-to-face video visit time during this encounter, and > 50% was spent counseling as documented under my assessment & plan.  Visit Diagnosis: 1. Neutrophilia   2. Nausea and vomiting, unspecified vomiting type     Dr. Owens Shark, MD, MPH [facility] Tel- [phone] 01/22/2023 8:35 AM

## 2023-01-23 ENCOUNTER — Encounter: Payer: Self-pay | Admitting: Family Medicine

## 2023-01-23 ENCOUNTER — Telehealth: Payer: Self-pay | Admitting: Cardiology

## 2023-01-23 ENCOUNTER — Ambulatory Visit: Payer: Medicare HMO | Admitting: Cardiology

## 2023-01-23 DIAGNOSIS — I4819 Other persistent atrial fibrillation: Secondary | ICD-10-CM

## 2023-01-23 DIAGNOSIS — Z79899 Other long term (current) drug therapy: Secondary | ICD-10-CM

## 2023-01-23 NOTE — Telephone Encounter (Signed)
Pt called for amplification on wanting to talk to Sherri: pt reports N/V/dizziness yesterday and some nausea today.  Pt reports "sour stomach" since taking Ozempic last week and pt reports skipping yesterday's dose.    Reports BG yesterday am 250 and yesterday pm 170; Pt reports that she was able to take metformin yesterday afternoon  Pt reports she was able to take Zofran yesterday and I counseled to take again Zofran again today (pt had described vomiting until bile appeared yesterday and I wanted to try and prevent that)   I counseled pt to avoid further use of Ozempic att  Pt asked that I pass on her apologies for missing EP appt today - "I really wanted to make it but I just couldn't"  Pt requesting callback for advice

## 2023-01-23 NOTE — Telephone Encounter (Signed)
Pt called in to reschedule her consult with Dr. Lalla Brothers in the [facility] office for today. She is now rescheduled for 11/13. While on the phone, pt requested to talk with [facility] about how she's been feeling.

## 2023-01-23 NOTE — Telephone Encounter (Signed)
Patient called in to triage line in the office today with symptoms of dizziness, nausea/vomiting and the use of his Zofran yesterday evening as well as blood sugars that have been elevated.  Patient had recently been on Ozempic and has been having side effects related to the current medication.  She did not take her dose on yesterday and is continue to have the symptoms.  Advised her to call her PCPs office about her symptoms and about stopping her current medication regimen if she wishes to no longer take Ozempic.  She is also been encouraged to use her as needed Zofran for nausea to prevent increased amount of vomiting like she had the night before.  She has also been encouraged to make sure that she stays compliant with taking her PPI to help offset some of her GI symptoms.  Patient's questions have been answered and she was appreciative of the call back.

## 2023-01-24 ENCOUNTER — Other Ambulatory Visit: Payer: Medicare HMO | Admitting: Pharmacist

## 2023-01-24 NOTE — Progress Notes (Signed)
   01/24/2023 Name: Lisa Gutierrez MRN: [id number] DOB: [birth year]  Chief Complaint  Patient presents with   Medication Assistance   Diabetes    Care Coordination  Lisa Gutierrez is a 72 y.o. year old female who presented for a telephone visit.   They were referred to the pharmacist by their Case Management Team  for assistance in managing diabetes and medication access.   S/O: Patient reports severe nausea, vomiting, dizziness. Previously counseled patient that GI upset related to ozempic is usually encountered within the first several days of injection, during which she reported she felt fine. This may be viral in nature, but nonetheless she is concerned and wishes to discontinue ozempic. She has taken a single dose of ozempic 0.25mg  weekly.   BG 170-180s (she states this is an improvement from starting metformin 500mg  BID)  A/P: - Recommend discontinue ozempic. Continue metformin 500mg  BID.  - Encourage rest, hydration, bland foods. - Check BG daily and document numbers. Goal will be to increase metformin dosing as tolerated, after acute GI upset has resolved.  Follow-up: scheduled 01/28/23 phone appt, will keep.   Lynnda Shields, PharmD, BCPS Clinical Pharmacist [facility]

## 2023-01-25 ENCOUNTER — Other Ambulatory Visit: Payer: Medicare HMO | Admitting: Pharmacist

## 2023-01-25 ENCOUNTER — Telehealth: Payer: Self-pay | Admitting: Pharmacy Technician

## 2023-01-25 DIAGNOSIS — Z5986 Financial insecurity: Secondary | ICD-10-CM

## 2023-01-25 NOTE — Progress Notes (Signed)
   01/25/2023 Name: Lisa Gutierrez MRN: [id number] DOB: 25-Dec-1950  Chief Complaint  Patient presents with   Medication Management    Care Coordination  Shanasia Ibrahim is a 72 y.o. year old female who presented for a telephone visit.   They were referred to the pharmacist by  incoming call from patient  for assistance in managing  medication-questions .    S/O: Patient was wondering about differences between ozempic and metformin, and questioned what she should be taking. She is starting to feel better, became very ill with nausea/vomiting after single dose of ozempic 0.25mg .  BG: 150-180s fasting, per patient report  A/P: - Provided medication counseling - Recommended continue metformin 500mg  BID, checking BG values and documenting  Follow-up: 01/28/23 as previously scheduled  Lynnda Shields, PharmD, BCPS Clinical Pharmacist [facility]

## 2023-01-25 NOTE — Progress Notes (Signed)
[facility])                                            [facility] Quality Pharmacy Team    01/25/2023  Lisa Gutierrez Feb 03, 1951 [id number]  Incoming call from patient in regard to Eliquis application with [employer].  Patient informs she has spent some more since last report in June and has mailed in the new pharmacy report.  Received it today and faxed into BMS.  [name], CPhT [place]  Office: [phone] Fax: [phone] Email: [email or web address]

## 2023-01-26 NOTE — Progress Notes (Unsigned)
This encounter was created in error - please disregard.

## 2023-01-28 ENCOUNTER — Other Ambulatory Visit: Payer: Medicare HMO | Admitting: Pharmacist

## 2023-01-28 ENCOUNTER — Other Ambulatory Visit: Payer: Self-pay | Admitting: Family Medicine

## 2023-01-28 DIAGNOSIS — E1165 Type 2 diabetes mellitus with hyperglycemia: Secondary | ICD-10-CM

## 2023-01-28 NOTE — Progress Notes (Unsigned)
01/28/2023 Name: Lisa Gutierrez MRN: [id number] DOB: Aug 31, 1950  Chief Complaint  Patient presents with   Diabetes   Medication Assistance    Lisa Gutierrez is a 72 y.o. year old female who presented for a telephone visit.   They were referred to the pharmacist by their Case Management Team  for assistance in managing diabetes and medication access   Subjective:  Care Team: Primary Care Provider: [name], PA  Medication Access/Adherence  Current Pharmacy:  [facility] [phone] [place] Rough, [place] - [address] [place] [address] Phone: [phone] Fax: [phone]  [facility] - [place], [place] - [address], [address], [address] [place] [address] Phone: [phone] Fax: [phone]  [facility] [place], [place] - [address] [place] [address] Phone: [phone] Fax: [phone]    Diabetes:  Current medications: jardiance 25mg  daily, metformin 500mg  BID Medications tried in the past: jaumet (discontinued by PCP when starting ozempic), ozempic (intolerance, GI upset)  Current glucose readings: 170-180s  Medication access:jardiance via Noreene Larsson Simcox CPhT   Objective:  Lab Results  Component Value Date   HGBA1C 9.1 12/27/2022    Lab Results  Component Value Date   CREATININE 0.69 12/04/2022   BUN 13 12/04/2022   NA 137 12/04/2022   K 4.4 12/04/2022   CL 98 12/04/2022   CO2 21 12/04/2022    Lab Results  Component Value Date   CHOL 195 08/24/2021   HDL 31 (L) 08/24/2021   LDLCALC 123 (H) 08/24/2021   TRIG 233 (H) 08/24/2021   CHOLHDL 6.3 (H) 08/24/2021    Medications Reviewed Today     Reviewed by Gabriel Carina, RPH (Pharmacist) on 01/28/23 at 1017  Med List Status: <None>   Medication Order Taking? Sig Documenting Provider Last Dose Status Informant  acetaminophen (TYLENOL) 500 MG tablet [id number]  Take 2 tablets by  mouth as needed. [provider]  Active Self, Pharmacy Records           Med Note Luster Landsberg, TIFFANI S   Tue Jul 10, 2022  5:44 PM)    ALPRAZolam Prudy Feeler) 0.5 MG tablet [id number]  Take 1 tablet (0.5 mg total) by mouth at bedtime as needed for anxiety or sleep. [name], PA  Active   apixaban (ELIQUIS) 5 MG TABS tablet [id number]  Take 1 tablet (5 mg total) by mouth 2 (two) times daily. Iran Ouch, MD  Active   Bioflavonoid Products (VITAMIN C) CHEW [id number]  Chew by mouth daily. With vitamin D and elderberry. 2 chews daily [provider]  Active Self, Pharmacy Records  Blood Glucose Monitoring Suppl (ACCU-CHEK GUIDE) w/Device KIT [id number]  by Does not apply route. Check BG as directed [provider]  Active            Med Note Littie Deeds, CHERYL A   Sun Dec 09, 2022 10:31 AM) Accu check Guide meter and strips and Softclix lancets  Calcium Carbonate (CALCIUM 500 PO) [id number]  Take 500 mg by mouth 2 (two) times daily. [provider]  Active Self, Pharmacy Records  clobetasol ointment (TEMOVATE) 0.05 % [id number]  Apply topically 2 (two) times daily. Apply topically to affected twice daily until improved. Carlean Jews, NP  Active            Med Note Sharlee Blew Dec 03, 2022  4:43 PM) When needed  digoxin (  LANOXIN) 0.25 MG tablet [id number]  TAKE 1 TABLET BY MOUTH EVERY DAY Hammock, Sheri, NP  Active   empagliflozin (JARDIANCE) 25 MG TABS tablet [id number] Yes Take 1 tablet (25 mg total) by mouth daily before breakfast. [name], PA Taking Active            Med Note Littie Deeds, CHERYL A   Mon Dec 03, 2022  4:44 PM)    Flaxseed, Linseed, (FLAXSEED OIL PO) [id number]  Take 1,500 mg by mouth 2 (two) times daily. [provider]  Active Self, Pharmacy Records  furosemide (LASIX) 20 MG tablet [id number]  TAKE 1 TABLET BY MOUTH EVERY DAY Hammock, Sheri, NP  Active            Med Note Littie Deeds, CHERYL A   Mon Dec 03, 2022  4:45  PM) If needed for swelling/fuild retention  LINZESS 145 MCG CAPS capsule [id number]  Take 1 capsule (145 mcg total) by mouth daily. [name], PA  Active   losartan (COZAAR) 25 MG tablet [id number]  Take 1 tablet (25 mg total) by mouth daily. Charlsie Quest, NP  Active   metFORMIN (GLUCOPHAGE-XR) 500 MG 24 hr tablet [id number] Yes Take 1 tablet (500 mg total) by mouth 2 (two) times daily with a meal. [name] A, PA Taking Active   metoprolol tartrate (LOPRESSOR) 50 MG tablet [id number]  TAKE 1.5 TABLETS BY MOUTH 2 TIMES DAILY. Charlsie Quest, NP  Active   montelukast (SINGULAIR) 10 MG tablet [id number]  Take 1 tablet (10 mg total) by mouth at bedtime. [name], PA  Active   Multiple Vitamins-Minerals (MULTIVITAMIN ADULT PO) [id number]  Take 1 tablet by mouth daily. [provider]  Active Self, Pharmacy Records  ondansetron (ZOFRAN) 4 MG tablet [id number]  Take 1 tablet (4 mg total) by mouth every 8 (eight) hours as needed for nausea or vomiting. Creig Hines, MD  Active   pantoprazole (PROTONIX) 40 MG tablet [id number]  TAKE 1 TABLET BY MOUTH TWICE A DAY Boscia, Heather E, NP  Active   venlafaxine XR (EFFEXOR-XR) 150 MG 24 hr capsule [id number]  Take 1 capsule (150 mg total) by mouth daily with breakfast. [name] A, PA  Active   venlafaxine XR (EFFEXOR-XR) 37.5 MG 24 hr capsule [id number]  Take 1 capsule (37.5 mg total) by mouth daily with breakfast. [name], PA  Active               Assessment/Plan:   Diabetes: - Currently uncontrolled, but adjusting medications - Reviewed goal A1c, goal fasting, and goal 2 hour post prandial glucose - Recommend to continue metformin 500mg  BID and jardiance 25mg  daily. Pcp has consulted endocrinology for DM management. Goal to increase metformin dosing as tolerated for glycemic control     Follow Up Plan:   Lynnda Shields, PharmD, BCPS Clinical Pharmacist [facility]

## 2023-01-30 ENCOUNTER — Encounter: Payer: Medicare HMO | Admitting: Psychiatry

## 2023-01-30 NOTE — Progress Notes (Signed)
Virtual Visit via Video Note  I connected with Lisa Gutierrez on 02/05/23 at 10:30 AM EDT by a video enabled telemedicine application and verified that I am speaking with the correct person using two identifiers.  Location: Patient: home Provider: office Persons participated in the visit- patient, provider    I discussed the limitations of evaluation and management by telemedicine and the availability of in person appointments. The patient expressed understanding and agreed to proceed.    I discussed the assessment and treatment plan with the patient. The patient was provided an opportunity to ask questions and all were answered. The patient agreed with the plan and demonstrated an understanding of the instructions.   The patient was advised to call back or seek an in-person evaluation if the symptoms worsen or if the condition fails to improve as anticipated.  I provided 20 minutes of non-face-to-face time during this encounter.   [name], MD      [facility] MD/PA/NP OP Progress Note  02/05/2023 11:03 AM Lisa Gutierrez  MRN:  [id number]  Chief Complaint: No chief complaint on file.  HPI:  According to the chart review, the following events have occurred since the last visit: The patient was seen by cardiology. Paroxysmal atrial fibrillation status post DCCV.  EKG today reveals the patient is still in rate controlled atrial fibrillation.  She is continued on apixaban 5 mg twice daily for CHA2DS2-VASc score of at least 5 for stroke prophylaxis.  She is also continued on digoxin 0.25 mg daily and metoprolol to tartrate 75 mg twice daily.  She continues to have complaints of fatigue, anxiety, shortness of breath, and dizziness.  She states that after recent cardioversion all of her symptoms had resolved but she was still in a sinus rhythm but since being back in atrial fibrillation symptoms continue.  She has her appointment with the EP scheduled for tomorrow.  This is  a follow-up appointment for depression, anxiety and insomnia.  She states that she is having a tough time due to recently found Afib.  She had a good care during the hospitalization.  She had her cataract surgery.  She is concerned about her having hatred.  She tends to have random dreams at night, and she feels ill, and hateful in the morning.  She agrees that her and her husband have been mean and hateful.  She has middle insomnia.  She does not want to have sleep evaluation at this time as she has so many appointments to go to.  She tries to keep herself together, and she also experiences dizziness.  She denies SI, HI.  She feels anxious at times.  She agrees with the plan as outlined below.    Wt Readings from Last 3 Encounters:  01/22/23 223 lb 12.8 oz (101.5 kg)  01/01/23 228 lb 9.6 oz (103.7 kg)  12/27/22 227 lb (103 kg)     Daily routine: takes care of her dogs, cats, house chores Exercise: unable to do due to knee pain Employment:  Support: none (talks with her oldest granddaughter, who has left to college) Household: husband Marital status: married for 52 years  Number of children: 2 sons.   Visit Diagnosis:    ICD-10-CM   1. MDD (major depressive disorder), recurrent, in partial remission (HCC)  F33.41 venlafaxine XR (EFFEXOR-XR) 150 MG 24 hr capsule    venlafaxine XR (EFFEXOR-XR) 37.5 MG 24 hr capsule    2. GAD (generalized anxiety disorder)  F41.1 venlafaxine XR (EFFEXOR-XR) 150 MG  24 hr capsule    venlafaxine XR (EFFEXOR-XR) 37.5 MG 24 hr capsule      Past Psychiatric History: Please see initial evaluation for full details. I have reviewed the history. No updates at this time.     Past Medical History:  Past Medical History:  Diagnosis Date   Anxiety    Atrial fibrillation (HCC)    Depression    Depression    Phreesia 07/02/2020   Diabetes mellitus without complication (HCC)    GERD (gastroesophageal reflux disease)    Hypertension    IBS (irritable bowel  syndrome)    Neuromuscular disorder (HCC)     Past Surgical History:  Procedure Laterality Date   BREAST EXCISIONAL BIOPSY     CARDIOVERSION N/A 09/03/2022   Procedure: CARDIOVERSION;  Surgeon: Iran Ouch, MD;  Location: [facility] ORS;  Service: Cardiovascular;  Laterality: N/A;   CATARACT EXTRACTION W/PHACO Right 07/24/2022   Procedure: CATARACT EXTRACTION PHACO AND INTRAOCULAR LENS PLACEMENT (IOC) RIGHT DIABETIC  5.93  00:42.5;  Surgeon: Galen Manila, MD;  Location: [facility];  Service: Ophthalmology;  Laterality: Right;   CATARACT EXTRACTION W/PHACO Left 08/07/2022   Procedure: CATARACT EXTRACTION PHACO AND INTRAOCULAR LENS PLACEMENT (IOC) LEFT DIABETIC  12.33  00:58.3;  Surgeon: Galen Manila, MD;  Location: [facility];  Service: Ophthalmology;  Laterality: Left;   CESAREAN SECTION N/A    Phreesia 07/02/2020   CHOLECYSTECTOMY     EXCISION / BIOPSY BREAST / NIPPLE / DUCT Right 1973   duct removed   SPINE SURGERY N/A    Phreesia 07/02/2020   TUBAL LIGATION N/A    Phreesia 07/02/2020    Family Psychiatric History: Please see initial evaluation for full details. I have reviewed the history. No updates at this time.     Family History:  Family History  Problem Relation Age of Onset   Diabetes Mother    Hypertension Mother    Coronary artery disease Father    Glaucoma Father    Breast cancer Neg Hx     Social History:  Social History   Socioeconomic History   Marital status: Married    Spouse name: Not on file   Number of children: Not on file   Years of education: Not on file   Highest education level: Not on file  Occupational History   Not on file  Tobacco Use   Smoking status: Former    Passive exposure: Past   Smokeless tobacco: Never  Vaping Use   Vaping status: Never Used  Substance and Sexual Activity   Alcohol use: No    Alcohol/week: 0.0 standard drinks of alcohol   Drug use: Never   Sexual activity: Not Currently     Partners: Male  Other Topics Concern   Not on file  Social History Narrative   Not on file   Social Determinants of Health   Financial Resource Strain: Low Risk  (10/15/2022)   Overall Financial Resource Strain (CARDIA)    Difficulty of Paying Living Expenses: Not very hard  Food Insecurity: No Food Insecurity (10/15/2022)   Hunger Vital Sign    Worried About Running Out of Food in the Last Year: Never true    Ran Out of Food in the Last Year: Never true  Transportation Needs: No Transportation Needs (08/09/2022)   PRAPARE - Administrator, Civil Service (Medical): No    Lack of Transportation (Non-Medical): No  Physical Activity: Inactive (08/09/2022)   Exercise Vital Sign  Days of Exercise per Week: 0 days    Minutes of Exercise per Session: 0 min  Stress: Stress Concern Present (10/15/2022)   [employer] of Occupational Health - Occupational Stress Questionnaire    Feeling of Stress : Rather much  Social Connections: Socially Integrated (08/09/2022)   Social Connection and Isolation Panel [NHANES]    Frequency of Communication with Friends and Family: More than three times a week    Frequency of Social Gatherings with Friends and Family: More than three times a week    Attends Religious Services: More than 4 times per year    Active Member of [employer] or Organizations: Yes    Attends Engineer, structural: More than 4 times per year    Marital Status: Married    Allergies:  Allergies  Allergen Reactions   Aspirin Other (See Comments)    GI upset   Ibuprofen Other (See Comments)    GERD   Levofloxacin Other (See Comments)    Weight gain   Meloxicam Other (See Comments)    GI upset   Morphine Other (See Comments)   Naprosyn  [Naproxen] Other (See Comments)    GI upset   Nsaids Other (See Comments)    GI upset   Ozempic (0.25 Or 0.5 Mg-Dose) [Semaglutide(0.25 Or 0.5mg -Dos)] Nausea And Vomiting   Propofol Other (See Comments)    Had horrible  nightmares   Quinolones    Robinul  [Glycopyrrolate] Nausea And Vomiting   Penicillin V Potassium Rash   Sulfa Antibiotics Rash    Metabolic Disorder Labs: Lab Results  Component Value Date   HGBA1C 9.1 12/27/2022   MPG 169 07/10/2022   No results found for: "PROLACTIN" Lab Results  Component Value Date   CHOL 195 08/24/2021   TRIG 233 (H) 08/24/2021   HDL 31 (L) 08/24/2021   CHOLHDL 6.3 (H) 08/24/2021   LDLCALC 123 (H) 08/24/2021   Lab Results  Component Value Date   TSH 0.908 07/10/2022   TSH 1.040 08/24/2021    Therapeutic Level Labs: No results found for: "LITHIUM" No results found for: "VALPROATE" No results found for: "CBMZ"  Current Medications: Current Outpatient Medications  Medication Sig Dispense Refill   busPIRone (BUSPAR) 5 MG tablet Take 1 tablet (5 mg total) by mouth 3 (three) times daily. 90 tablet 1   acetaminophen (TYLENOL) 500 MG tablet Take 2 tablets by mouth as needed.     ALPRAZolam (XANAX) 0.5 MG tablet Take 1 tablet (0.5 mg total) by mouth at bedtime as needed for anxiety or sleep. 30 tablet 2   apixaban (ELIQUIS) 5 MG TABS tablet Take 1 tablet (5 mg total) by mouth 2 (two) times daily. 60 tablet 5   Bioflavonoid Products (VITAMIN C) CHEW Chew by mouth daily. With vitamin D and elderberry. 2 chews daily     Blood Glucose Monitoring Suppl (ACCU-CHEK GUIDE) w/Device KIT by Does not apply route. Check BG as directed     Calcium Carbonate (CALCIUM 500 PO) Take 500 mg by mouth 2 (two) times daily.     clobetasol ointment (TEMOVATE) 0.05 % Apply topically 2 (two) times daily. Apply topically to affected twice daily until improved. 30 g 2   digoxin (LANOXIN) 0.25 MG tablet TAKE 1 TABLET BY MOUTH EVERY DAY 30 tablet 1   empagliflozin (JARDIANCE) 25 MG TABS tablet Take 1 tablet (25 mg total) by mouth daily before breakfast. 90 tablet 1   Flaxseed, Linseed, (FLAXSEED OIL PO) Take 1,500 mg by mouth 2 (  two) times daily.     furosemide (LASIX) 20 MG tablet  TAKE 1 TABLET BY MOUTH EVERY DAY 30 tablet 1   LINZESS 145 MCG CAPS capsule Take 1 capsule (145 mcg total) by mouth daily. 30 capsule 2   losartan (COZAAR) 25 MG tablet Take 1 tablet (25 mg total) by mouth daily. 90 tablet 3   metFORMIN (GLUCOPHAGE-XR) 500 MG 24 hr tablet Take 1 tablet (500 mg total) by mouth 2 (two) times daily with a meal. 60 tablet 1   metoprolol tartrate (LOPRESSOR) 50 MG tablet TAKE 1.5 TABLETS BY MOUTH 2 TIMES DAILY. 90 tablet 0   montelukast (SINGULAIR) 10 MG tablet Take 1 tablet (10 mg total) by mouth at bedtime. 90 tablet 3   Multiple Vitamins-Minerals (MULTIVITAMIN ADULT PO) Take 1 tablet by mouth daily.     ondansetron (ZOFRAN) 4 MG tablet Take 1 tablet (4 mg total) by mouth every 8 (eight) hours as needed for nausea or vomiting. 10 tablet 0   pantoprazole (PROTONIX) 40 MG tablet TAKE 1 TABLET BY MOUTH TWICE A DAY 180 tablet 1   venlafaxine XR (EFFEXOR-XR) 150 MG 24 hr capsule Take 1 capsule (150 mg total) by mouth daily with breakfast. Take total of 187.5 mg daily. Take along with 37.5 mg cap 90 capsule 0   venlafaxine XR (EFFEXOR-XR) 37.5 MG 24 hr capsule Take 1 capsule (37.5 mg total) by mouth daily with breakfast. Take total of 187.5 mg daily. Take along with 150 mg cap 90 capsule 0   No current facility-administered medications for this visit.     Musculoskeletal: Strength & Muscle Tone:  N/A Gait & Station:  N/A Patient leans: N/A  Psychiatric Specialty Exam: Review of Systems  Psychiatric/Behavioral:  Positive for dysphoric mood and sleep disturbance. Negative for agitation, behavioral problems, confusion, decreased concentration, hallucinations, self-injury and suicidal ideas. The patient is nervous/anxious. The patient is not hyperactive.   All other systems reviewed and are negative.   There were no vitals taken for this visit.There is no height or weight on file to calculate BMI.  General Appearance: Well Groomed  Eye Contact:  Good  Speech:  Clear  and Coherent  Volume:  Normal  Mood:   hatred  Affect:  Appropriate, Congruent, and calm  Thought Process:  Coherent  Orientation:  Full (Time, Place, and Person)  Thought Content: Logical   Suicidal Thoughts:  No  Homicidal Thoughts:  No  Memory:  Immediate;   Good  Judgement:  Good  Insight:  Good  Psychomotor Activity:  Normal  Concentration:  Concentration: Good and Attention Span: Good  Recall:  Good  Fund of Knowledge: Good  Language: Good  Akathisia:  No  Handed:  Right  AIMS (if indicated): not done  Assets:  Communication Skills Desire for Improvement  ADL's:  Intact  Cognition: WNL  Sleep:  Poor   Screenings: GAD-7    Flowsheet Row Office Visit from 12/27/2022 in [facility] at [facility] Video Visit from 11/08/2022 in [facility] at [facility] Visit from 09/25/2022 in [facility] at [facility] Erroneous Encounter from 07/18/2022 in [facility] Care at [facility] Video Visit from 01/01/2022 in [facility] at [facility]  Total GAD-7 Score 17 15 12 13 13       Mini-Mental    Flowsheet Row Clinical Support from 02/12/2020 in [facility] Clinical Support from 02/06/2018 in [facility], [facility]  Total  Score (max 30 points ) 29 30      PHQ2-9    Flowsheet Row Office Visit from 12/27/2022 in [facility] at [facility] Video Visit from 11/08/2022 in [facility] Care at [facility] Coordination from 10/15/2022 in [facility] Coordination Office Visit from 09/25/2022 in [facility] at [facility] Clinical Support from 08/09/2022 in [facility]  PHQ-2 Total Score 6 6 5 5  0  PHQ-9 Total Score 24 22 19 19 2       Flowsheet Row Admission (Discharged) from 08/07/2022 in [facility] Most recent reading at 08/07/2022  7:15 AM Counselor from 07/20/2022 in [facility] [place] Most recent reading at 07/24/2022  8:33 AM Admission (Discharged) from 07/24/2022 in [facility] PERIOP Most recent reading at 07/24/2022  7:47 AM  C-SSRS RISK CATEGORY No Risk Low Risk No Risk        Assessment and Plan:  Addilee Heckard is a 72 y.o. year old female with a history of  depression, hypertension, diabetes, GERD, newly diagnosed Afib with RVR, who presents for follow up appointment for below.   1. MDD (major depressive disorder), recurrent, mild (HCC) 2. GAD (generalized anxiety disorder) Acute stressors include: pending cataract surgery, fall  Other stressors include: marital conflict, conflict with her children, financial strain  History:     She reports worsening in irritability, which she experiences in the morning, and she complains of insomnia.  Although it was discussed to consider holding any medication adjustment and consider a sleep evaluation, she reports no interest in pursuing this at this time due to her being busy with other appointments.  She also experiences dizziness, which precludes some psychotropics.  Will start BuSpar to target anxiety.  Will continue venlafaxine to target depression and anxiety.  Although she will greatly benefit from CBT, she is unable to afford this.   # insomnia - sleep eval long time ago, reportedly normal.  Worsening.  She complains of middle insomnia and daytime fatigue. Although she was strongly encouraged to consider a sleep evaluation, she declined at this.  Will make the above intervention as outlined above.   Plan Continue venlafaxine 187.5 mg daily- monitor weight gain Start Buspar 5 mg three times a day  Next appointment : 10/16 at 9 30, video.  She declined in person visit due to her physical condition   Past trials of medication: sertraline, citalopram, lexapro, fluoxetine, bupropion (headache)   The patient demonstrates the following risk factors for  suicide: Chronic risk factors for suicide include: psychiatric disorder of depression and chronic pain. Acute risk factors for suicide include: family or marital conflict. Protective factors for this patient include: hope for the future. Considering these factors, the overall suicide risk at this point appe  Collaboration of Care: Collaboration of Care: Other reviewed notes in Epic  Patient/Guardian was advised Release of Information must be obtained prior to any record release in order to collaborate their care with an outside provider. Patient/Guardian was advised if they have not already done so to contact the registration department to sign all necessary forms in order for [place] to release information regarding their care.   Consent: Patient/Guardian gives verbal consent for treatment and assignment of benefits for services provided during this visit. Patient/Guardian expressed understanding and agreed to proceed.    [name], MD 02/05/2023, 11:03 AM

## 2023-02-01 ENCOUNTER — Telehealth: Payer: Self-pay | Admitting: Pharmacy Technician

## 2023-02-01 DIAGNOSIS — Z5986 Financial insecurity: Secondary | ICD-10-CM

## 2023-02-01 NOTE — Progress Notes (Addendum)
[facility])                                            [facility] Quality Pharmacy Team    02/01/2023  Lisa Gutierrez 1950/06/27 [id number]  Care coordination call placed to [employer] in regard to Eliquis application.  Spoke to [place] who informs patient needs to spend $37.85 more to qualify. She informs the 3% is based off of line 11 of the tax return and 3% of that is 971.97. [employer]MS gave patient a credit of $438.67. Patient has currently spent 495.45. This leaves a balance of 37.85 which patient will have to spend to qualify. Successful outreach to patient and provided this information. She will send in new printout if she meets the additional $37.85.  [name], CPhT [place]  Office: [phone] Fax: [phone] Email: [email or web address]

## 2023-02-04 ENCOUNTER — Other Ambulatory Visit: Payer: Self-pay | Admitting: Cardiology

## 2023-02-04 DIAGNOSIS — Z7984 Long term (current) use of oral hypoglycemic drugs: Secondary | ICD-10-CM | POA: Diagnosis not present

## 2023-02-04 DIAGNOSIS — J309 Allergic rhinitis, unspecified: Secondary | ICD-10-CM | POA: Diagnosis not present

## 2023-02-04 DIAGNOSIS — F320 Major depressive disorder, single episode, mild: Secondary | ICD-10-CM | POA: Diagnosis not present

## 2023-02-04 DIAGNOSIS — K219 Gastro-esophageal reflux disease without esophagitis: Secondary | ICD-10-CM | POA: Diagnosis not present

## 2023-02-04 DIAGNOSIS — I10 Essential (primary) hypertension: Secondary | ICD-10-CM | POA: Diagnosis not present

## 2023-02-04 DIAGNOSIS — I4891 Unspecified atrial fibrillation: Secondary | ICD-10-CM | POA: Diagnosis not present

## 2023-02-04 DIAGNOSIS — F419 Anxiety disorder, unspecified: Secondary | ICD-10-CM | POA: Diagnosis not present

## 2023-02-04 DIAGNOSIS — E11620 Type 2 diabetes mellitus with diabetic dermatitis: Secondary | ICD-10-CM | POA: Diagnosis not present

## 2023-02-04 DIAGNOSIS — D6869 Other thrombophilia: Secondary | ICD-10-CM | POA: Diagnosis not present

## 2023-02-04 DIAGNOSIS — R42 Dizziness and giddiness: Secondary | ICD-10-CM | POA: Diagnosis not present

## 2023-02-04 DIAGNOSIS — Z87891 Personal history of nicotine dependence: Secondary | ICD-10-CM | POA: Diagnosis not present

## 2023-02-05 ENCOUNTER — Encounter: Payer: Self-pay | Admitting: Psychiatry

## 2023-02-05 ENCOUNTER — Telehealth (INDEPENDENT_AMBULATORY_CARE_PROVIDER_SITE_OTHER): Payer: Medicare HMO | Admitting: Psychiatry

## 2023-02-05 DIAGNOSIS — F411 Generalized anxiety disorder: Secondary | ICD-10-CM | POA: Diagnosis not present

## 2023-02-05 DIAGNOSIS — F3341 Major depressive disorder, recurrent, in partial remission: Secondary | ICD-10-CM | POA: Diagnosis not present

## 2023-02-05 MED ORDER — BUSPIRONE HCL 5 MG PO TABS: 5.0000 mg | ORAL_TABLET | Freq: Three times a day (TID) | ORAL | 1 refills | Status: DC

## 2023-02-05 MED ORDER — VENLAFAXINE HCL ER 150 MG PO CP24: 150.0000 mg | ORAL_CAPSULE | Freq: Every day | ORAL | 0 refills | Status: DC

## 2023-02-05 MED ORDER — VENLAFAXINE HCL ER 37.5 MG PO CP24
37.5000 mg | ORAL_CAPSULE | Freq: Every day | ORAL | 0 refills | Status: DC
Start: 2023-02-05 — End: 2023-03-29

## 2023-02-05 NOTE — Patient Instructions (Signed)
Continue venlafaxine 187.5 mg daily Start Buspar 5 mg three times a day  Next appointment : 10/16 at 9 30

## 2023-02-06 ENCOUNTER — Other Ambulatory Visit: Payer: Self-pay | Admitting: Family Medicine

## 2023-02-06 DIAGNOSIS — L900 Lichen sclerosus et atrophicus: Secondary | ICD-10-CM | POA: Diagnosis not present

## 2023-02-06 DIAGNOSIS — L820 Inflamed seborrheic keratosis: Secondary | ICD-10-CM | POA: Diagnosis not present

## 2023-02-06 DIAGNOSIS — L281 Prurigo nodularis: Secondary | ICD-10-CM | POA: Diagnosis not present

## 2023-02-06 DIAGNOSIS — L570 Actinic keratosis: Secondary | ICD-10-CM | POA: Diagnosis not present

## 2023-02-06 DIAGNOSIS — E1165 Type 2 diabetes mellitus with hyperglycemia: Secondary | ICD-10-CM

## 2023-02-06 DIAGNOSIS — L538 Other specified erythematous conditions: Secondary | ICD-10-CM | POA: Diagnosis not present

## 2023-02-06 MED ORDER — EMPAGLIFLOZIN 25 MG PO TABS
25.0000 mg | ORAL_TABLET | Freq: Every day | ORAL | 1 refills | Status: DC
Start: 2023-02-06 — End: 2024-01-02

## 2023-02-07 ENCOUNTER — Other Ambulatory Visit: Payer: Medicare HMO | Admitting: Pharmacist

## 2023-02-07 ENCOUNTER — Other Ambulatory Visit: Payer: Self-pay | Admitting: Family Medicine

## 2023-02-07 DIAGNOSIS — F3341 Major depressive disorder, recurrent, in partial remission: Secondary | ICD-10-CM

## 2023-02-07 DIAGNOSIS — F411 Generalized anxiety disorder: Secondary | ICD-10-CM

## 2023-02-07 NOTE — Progress Notes (Signed)
02/08/2023 Name: Lisa Gutierrez MRN: [id number] DOB: [birth year]  Chief Complaint  Patient presents with   Diabetes    Lisa Gutierrez is a 72 y.o. year old female who presented for a telephone visit.   They were referred to the pharmacist by their Case Management Team  for assistance in managing diabetes and medication access   Subjective:  Care Team: Primary Care Provider: [name], PA  Medication Access/Adherence  Current Pharmacy:  [facility] [phone] [place] Rough, [place] - [address] [place] [address] Phone: [phone] Fax: [phone]  [facility] - [place], [place] - [address], [address], [address] [place] [address] Phone: [phone] Fax: [phone]  [facility] [place], [place] - [address] [place] [address] Phone: [phone] Fax: [phone]    Diabetes:  Current medications: jardiance 25mg  daily, metformin 500mg  BID Medications tried in the past: jaumet (discontinued by PCP when starting ozempic), ozempic (intolerance, GI upset)  Current glucose readings: 170-180s  Medication access:jardiance via Noreene Larsson Simcox CPhT   Objective:  Lab Results  Component Value Date   HGBA1C 9.1 12/27/2022    Lab Results  Component Value Date   CREATININE 0.69 12/04/2022   BUN 13 12/04/2022   NA 137 12/04/2022   K 4.4 12/04/2022   CL 98 12/04/2022   CO2 21 12/04/2022    Lab Results  Component Value Date   CHOL 195 08/24/2021   HDL 31 (L) 08/24/2021   LDLCALC 123 (H) 08/24/2021   TRIG 233 (H) 08/24/2021   CHOLHDL 6.3 (H) 08/24/2021    Medications Reviewed Today     Reviewed by Gabriel Carina, RPH (Pharmacist) on 02/08/23 at [id number]  Med List Status: <None>   Medication Order Taking? Sig Documenting Provider Last Dose Status Informant  acetaminophen (TYLENOL) 500 MG tablet [id number]  Take 2 tablets by mouth as needed. [provider]  Active Self, Pharmacy Records           Med Note Luster Landsberg, TIFFANI S   Tue Jul 10, 2022  5:44 PM)    ALPRAZolam Prudy Feeler) 0.5 MG tablet [id number]  Take 1 tablet (0.5 mg total) by mouth at bedtime as needed for anxiety or sleep. [name], PA  Active   apixaban (ELIQUIS) 5 MG TABS tablet [id number]  Take 1 tablet (5 mg total) by mouth 2 (two) times daily. Iran Ouch, MD  Active   Bioflavonoid Products (VITAMIN C) CHEW [id number]  Chew by mouth daily. With vitamin D and elderberry. 2 chews daily [provider]  Active Self, Pharmacy Records  Blood Glucose Monitoring Suppl (ACCU-CHEK GUIDE) w/Device KIT [id number]  by Does not apply route. Check BG as directed [provider]  Active            Med Note Littie Deeds, CHERYL A   Sun Dec 09, 2022 10:31 AM) Accu check Guide meter and strips and Softclix lancets  busPIRone (BUSPAR) 5 MG tablet [id number]  Take 1 tablet (5 mg total) by mouth 3 (three) times daily. [name], MD  Active   Calcium Carbonate (CALCIUM 500 PO) [id number]  Take 500 mg by mouth 2 (two) times daily. [provider]  Active Self, Pharmacy Records  clobetasol ointment (TEMOVATE) 0.05 % [id number]  Apply topically 2 (two) times daily. Apply topically to affected twice daily until improved. Carlean Jews, NP  Active  Med Note Littie Deeds, CHERYL A   Mon Dec 03, 2022  4:43 PM) When needed  digoxin (LANOXIN) 0.25 MG tablet [id number]  TAKE 1 TABLET BY MOUTH EVERY DAY Hammock, Sheri, NP  Active   empagliflozin (JARDIANCE) 25 MG TABS tablet [id number]  Take 1 tablet (25 mg total) by mouth daily before breakfast. [name], PA  Active   Flaxseed, Linseed, (FLAXSEED OIL PO) [id number]  Take 1,500 mg by mouth 2 (two) times daily. [provider]  Active Self, Pharmacy Records  furosemide (LASIX) 20 MG tablet [id number]  TAKE 1 TABLET BY MOUTH EVERY DAY Hammock, Sheri, NP  Active            Med Note Littie Deeds, CHERYL A    Mon Dec 03, 2022  4:45 PM) If needed for swelling/fuild retention  LINZESS 145 MCG CAPS capsule [id number]  Take 1 capsule (145 mcg total) by mouth daily. [name], PA  Active   losartan (COZAAR) 25 MG tablet [id number]  Take 1 tablet (25 mg total) by mouth daily. Charlsie Quest, NP  Active   metFORMIN (GLUCOPHAGE-XR) 500 MG 24 hr tablet [id number] Yes Take 1 tablet (500 mg total) by mouth 2 (two) times daily with a meal. [name] A, PA Taking Active   metoprolol tartrate (LOPRESSOR) 50 MG tablet [id number]  TAKE 1.5 TABLETS BY MOUTH 2 TIMES DAILY. Charlsie Quest, NP  Active   montelukast (SINGULAIR) 10 MG tablet [id number]  Take 1 tablet (10 mg total) by mouth at bedtime. [name], PA  Active   Multiple Vitamins-Minerals (MULTIVITAMIN ADULT PO) [id number]  Take 1 tablet by mouth daily. [provider]  Active Self, Pharmacy Records  ondansetron (ZOFRAN) 4 MG tablet [id number]  Take 1 tablet (4 mg total) by mouth every 8 (eight) hours as needed for nausea or vomiting. Creig Hines, MD  Active   pantoprazole (PROTONIX) 40 MG tablet [id number]  TAKE 1 TABLET BY MOUTH TWICE A DAY Boscia, Heather E, NP  Active   venlafaxine XR (EFFEXOR-XR) 150 MG 24 hr capsule [id number]  Take 1 capsule (150 mg total) by mouth daily with breakfast. Take total of 187.5 mg daily. Take along with 37.5 mg cap Hisada, Barbee Cough, MD  Active   venlafaxine XR (EFFEXOR-XR) 37.5 MG 24 hr capsule [id number]  Take 1 capsule (37.5 mg total) by mouth daily with breakfast. Take total of 187.5 mg daily. Take along with 150 mg cap Hisada, Reina, MD  Active               Assessment/Plan:   Diabetes: - Currently uncontrolled, but adjusting medications - Reviewed goal A1c, goal fasting, and goal 2 hour post prandial glucose - Recommend to continue metformin 500mg  BID and jardiance 25mg  daily. Pcp has consulted endocrinology for DM management. Goal to increase metformin dosing as tolerated for glycemic control      Follow Up Plan: none scheduled at this time, recommended patient keep endocrinology appt, patient has contact number for future pharmacist support   Lynnda Shields, PharmD, BCPS Clinical Pharmacist [facility]

## 2023-02-08 ENCOUNTER — Telehealth: Payer: Self-pay | Admitting: Pharmacy Technician

## 2023-02-08 DIAGNOSIS — Z5986 Financial insecurity: Secondary | ICD-10-CM

## 2023-02-08 NOTE — Progress Notes (Addendum)
[facility])                                            [facility] Team   02/20/2023- ADDENDUM Re mailed applications as outlined below as patient informs she did not get the first mailing.  02/08/2023  Lisa Gutierrez Feb 09, 1951 [id number]                                      Medication Assistance Referral  Referral From:  Self Referral for 2025 re enrollment for medication assistance  Medication/Company: Karlene Einstein / [employer] Patient application portion:  Mailed Provider application portion: Faxed  to [name], PA Provider address/fax verified via: Office website  Medication/Company: [employer] / [employer] Patient application portion:  Mailed Provider application portion: Faxed  to [name], PA Provider address/fax verified via: Office website

## 2023-02-11 ENCOUNTER — Other Ambulatory Visit: Payer: Self-pay | Admitting: Family Medicine

## 2023-02-11 ENCOUNTER — Ambulatory Visit: Payer: Self-pay | Admitting: *Deleted

## 2023-02-11 DIAGNOSIS — E1165 Type 2 diabetes mellitus with hyperglycemia: Secondary | ICD-10-CM

## 2023-02-11 MED ORDER — BLOOD GLUCOSE TEST VI STRP: 1.0000 | ORAL_STRIP | Freq: Three times a day (TID) | 9 refills | Status: DC

## 2023-02-11 MED ORDER — GLUCOSE BLOOD VI STRP
ORAL_STRIP | 12 refills | Status: DC
  Filled 2023-12-05: qty 100, 33d supply, fill #0

## 2023-02-11 MED ORDER — BLOOD GLUCOSE MONITORING SUPPL DEVI: 1.0000 | Freq: Three times a day (TID) | 0 refills | Status: DC

## 2023-02-11 NOTE — Patient Outreach (Signed)
  Care Coordination   Follow Up Visit Note   02/11/2023 Name: Lisa Gutierrez MRN: [id number] DOB: 06-19-1950  Lisa Gutierrez is a 72 y.o. year old female who sees [name], [place] for primary care. I spoke with  [name] by phone today.  What matters to the patients health and wellness today?  Feeling like the medications are making me "groggy".    Goals Addressed             This Visit's Progress    Reduce symptoms related to depression and  anxiety       Activities and task to complete in order to accomplish goals.   Continue with medication changes made recently by Dr hisada Review and consider the additional support resources mailed to you Reach out when/if  you decide to previous counselor to ask about connecting/passing you with new counselor Find ways to relax and reduce your anxiety- email with material being sent today Find ways to remove yourself from conversations that are upsetting  Call your insurance provider for more information about your Enhanced Benefits ( ) Start / continue relaxed breathing 3 times daily Keep all upcoming appointment discussed today Continue with compliance of taking medication prescribed by Doctor Consider plans for on-going plans for behavioral health care as discussed Call 988 for mental health support/counselor 24/7   Call 911 for emergent need Consider local Hospice agency for free grief counseling and support         SDOH assessments and interventions completed:  Yes     Care Coordination Interventions:  Yes, provided  Interventions Today    Flowsheet Row Most Recent Value  Chronic Disease   Chronic disease during today's visit Other, Chronic Kidney Disease/End Stage Renal Disease (ESRD)  [anxiety]  General Interventions   Doctor Visits Discussed/Reviewed Specialist  [Pt plans to see Endo and Cardiac Specialists this month]  Mental Health Interventions   Mental Health Discussed/Reviewed  Mental Health Discussed, Anxiety, Depression, Coping Strategies, Mental Health Reviewed  [Pt remains "overwhelmed" and anxious about many things- wants to ask about changing to Lexapro as she feels the Buspar makes her feel "groggy:"- encouraged pt to discuss with prescribing MD.]  Pharmacy Interventions   Pharmacy Dicussed/Reviewed Pharmacy Topics Discussed, Pharmacy Topics Reviewed  [Pt feels overwhelmed by all her medications- discussed polypharmacy with her and encouraged her to discuss her RX's with Providers  to help her understand each one and to reassess.consider options if appropriate]       Follow up plan: Follow up call scheduled for 02/26/23    Encounter Outcome:  Patient Visit Completed

## 2023-02-12 NOTE — Patient Outreach (Deleted)
  Care Coordination   Follow Up Visit Note   02/12/2023 Name: Lisa Gutierrez MRN: [id number] DOB: [birth year]  Lisa Gutierrez is a 72 y.o. year old female who sees [name], [place] for primary care. I spoke with  Lisa Gutierrez by phone today.  What matters to the patients health and wellness today?  Doctor changed medication (Buspar) and feeling "groggy"    Goals Addressed             This Visit's Progress    Reduce symptoms related to depression and  anxiety       Activities and task to complete in order to accomplish goals.   Continue with medication changes made recently by Dr hisada- discuss concerns or other requests with her also Review and consider the additional support resources mailed to you Reach out when/if  you decide to previous counselor to ask about connecting/passing you with new counselor Find ways to relax and reduce your anxiety- email with material being sent today Find ways to remove yourself from conversations that are upsetting  Call your insurance provider for more information about your Enhanced Benefits ( ) Start / continue relaxed breathing 3 times daily Keep all upcoming appointment discussed today Continue with compliance of taking medication prescribed by Doctor Consider plans for on-going plans for behavioral health care as discussed Call 988 for mental health support/counselor 24/7   Call 911 for emergent need Consider local Hospice agency for free grief counseling and support         SDOH assessments and interventions completed:  Yes     Care Coordination Interventions:  Yes, provided  Interventions Today    Flowsheet Row Most Recent Value  Chronic Disease   Chronic disease during today's visit Other, Chronic Kidney Disease/End Stage Renal Disease (ESRD)  [anxiety]  General Interventions   Doctor Visits Discussed/Reviewed Specialist  [Pt plans to see Endo and Cardiac Specialists this month]  Mental  Health Interventions   Mental Health Discussed/Reviewed Mental Health Discussed, Anxiety, Depression, Coping Strategies, Mental Health Reviewed  [Pt remains "overwhelmed" and anxious about many things- wants to ask about changing to Lexapro as she feels the Buspar makes her feel "groggy:"- encouraged pt to discuss with prescribing MD.]  Pharmacy Interventions   Pharmacy Dicussed/Reviewed Pharmacy Topics Discussed, Pharmacy Topics Reviewed  [Pt feels overwhelmed by all her medications- discussed polypharmacy with her and encouraged her to discuss her RX's with Providers  to help her understand each one and to reassess.consider options if appropriate]       Follow up plan: Follow up call scheduled for 02/26/23    Encounter Outcome:  Patient Visit Completed

## 2023-02-12 NOTE — Patient Instructions (Signed)
Visit Information  Thank you for taking time to visit with me today. Please don't hesitate to contact me if I can be of assistance to you.   Following are the goals we discussed today:   Goals Addressed             This Visit's Progress    Reduce symptoms related to depression and  anxiety       Activities and task to complete in order to accomplish goals.   Continue with medication changes made recently by Dr hisada- discuss concerns or other requests with her also Review and consider the additional support resources mailed to you Reach out when/if  you decide to previous counselor to ask about connecting/passing you with new counselor Find ways to relax and reduce your anxiety- email with material being sent today Find ways to remove yourself from conversations that are upsetting  Call your insurance provider for more information about your Enhanced Benefits ( ) Start / continue relaxed breathing 3 times daily Keep all upcoming appointment discussed today Continue with compliance of taking medication prescribed by Doctor Consider plans for on-going plans for behavioral health care as discussed Call 988 for mental health support/counselor 24/7   Call 911 for emergent need Consider local Hospice agency for free grief counseling and support         Our next appointment is by telephone on 02/26/23   Please call the care guide team at [phone] if you need to cancel or reschedule your appointment.   If you are experiencing a Mental Health or Behavioral Health Crisis or need someone to talk to, please call the Suicide and Crisis Lifeline: 988 call 911   The patient verbalized understanding of instructions, educational materials, and care plan provided today and DECLINED offer to receive copy of patient instructions, educational materials, and care plan.   Telephone follow up appointment with care management team member scheduled for: 02/26/23 Reece Levy, MSW, LCSW [facility], [facility] Licensed Clinical Social Worker Care Coordinator  [phone]

## 2023-02-14 DIAGNOSIS — E1169 Type 2 diabetes mellitus with other specified complication: Secondary | ICD-10-CM | POA: Diagnosis not present

## 2023-02-14 DIAGNOSIS — E1159 Type 2 diabetes mellitus with other circulatory complications: Secondary | ICD-10-CM | POA: Diagnosis not present

## 2023-02-14 DIAGNOSIS — I152 Hypertension secondary to endocrine disorders: Secondary | ICD-10-CM | POA: Diagnosis not present

## 2023-02-14 DIAGNOSIS — E1165 Type 2 diabetes mellitus with hyperglycemia: Secondary | ICD-10-CM | POA: Diagnosis not present

## 2023-02-14 DIAGNOSIS — E669 Obesity, unspecified: Secondary | ICD-10-CM | POA: Diagnosis not present

## 2023-02-15 ENCOUNTER — Ambulatory Visit: Payer: Self-pay

## 2023-02-15 ENCOUNTER — Other Ambulatory Visit: Payer: Medicare HMO | Admitting: Pharmacist

## 2023-02-15 NOTE — Patient Outreach (Signed)
  Care Coordination   Follow Up Visit Note   02/15/2023 Name: Lisa Gutierrez MRN: [id number] DOB: [birth year]  Lisa Gutierrez is a 72 y.o. year old female who sees [name], [place] for primary care. I spoke with  [name] by phone today.  What matters to the patients health and wellness today?  Lisa Gutierrez reports that her blood pressure has been stable over the past three days, with measurements recorded as 138/64, 117/76,  and 109/79. She recently consulted with an endocrinologist, Dr. Elonda Husky, and expressed satisfaction with her most recent A1C level of 9.1. Lisa Gutierrez is interested in initiating treatment with Rybelsus at a dosage of 3 milligrams and has received a supply of three tablets for trial purposes. Furthermore, she has discontinued her previous medication, Olympics, due to adverse effects that were causing her significant discomfort. Her psychiatrist has initiated treatment with Buspirone at a dosage of 5 milligrams.    Goals Addressed             This Visit's Progress    I want help to understand my conditions so I can prepare for the furture       Patient Goals/Self Care Activities: -Patient/Caregiver will self-administer medications as prescribed as evidenced by self-report/primary caregiver report  -Patient/Caregiver will attend all scheduled provider appointments as evidenced by clinician review of documented attendance to scheduled appointments and patient/caregiver report -Patient/Caregiver will call pharmacy for medication refills as evidenced by patient report and review of pharmacy fill history as appropriate -Patient/Caregiver will call provider office for new concerns or questions as evidenced by review of documented incoming telephone call notes and patient report -Patient/Caregiver will focus on medication adherence by taking medications as prescribed  -Checks BP and records as discussed -check blood sugar at prescribed  times -record values and write them down take them to all doctor visits   -Take the medications prescribed to control your heart rate and rhythm and reduce your risk of blood clot formation (anticoagulants or antiplatelet medications/"blood thinners"). -Learn to check your pulse and write down the number every day.  -Learn what we discussed today about AFIB.  Don't get overwhelmed.  Read the information that I sent you about AFIB.  At our next talk we will discuss Hypertension. BP Readings from Last 3 Encounters:  01/22/23 136/78  01/01/23 129/80  12/27/22 123/85         Patient Stated                SDOH assessments and interventions completed:  No     Care Coordination Interventions:  Yes, provided   Interventions Today    Flowsheet Row Most Recent Value  Chronic Disease   Chronic disease during today's visit Hypertension (HTN), Diabetes  General Interventions   General Interventions Discussed/Reviewed General Interventions Discussed, General Interventions Reviewed  Doctor Visits Discussed/Reviewed Doctor Visits Discussed  Pharmacy Interventions   Pharmacy Dicussed/Reviewed Pharmacy Topics Discussed  Safety Interventions   Safety Discussed/Reviewed Safety Discussed        Follow up plan: Follow up call scheduled for 03/20/23  230 pm    Encounter Outcome:  Patient Visit Completed   Juanell Fairly RN, BSN, [facility]  [facility], [facility]  Care Coordinator Phone: [phone]

## 2023-02-15 NOTE — Patient Instructions (Signed)
Visit Information  Thank you for taking time to visit with me today. Please don't hesitate to contact me if I can be of assistance to you.   Following are the goals we discussed today:   Goals Addressed             This Visit's Progress    I want help to understand my conditions so I can prepare for the furture       Patient Goals/Self Care Activities: -Patient/Caregiver will self-administer medications as prescribed as evidenced by self-report/primary caregiver report  -Patient/Caregiver will attend all scheduled provider appointments as evidenced by clinician review of documented attendance to scheduled appointments and patient/caregiver report -Patient/Caregiver will call pharmacy for medication refills as evidenced by patient report and review of pharmacy fill history as appropriate -Patient/Caregiver will call provider office for new concerns or questions as evidenced by review of documented incoming telephone call notes and patient report -Patient/Caregiver will focus on medication adherence by taking medications as prescribed  -Checks BP and records as discussed -check blood sugar at prescribed times -record values and write them down take them to all doctor visits   -Take the medications prescribed to control your heart rate and rhythm and reduce your risk of blood clot formation (anticoagulants or antiplatelet medications/"blood thinners"). -Learn to check your pulse and write down the number every day.  -Learn what we discussed today about AFIB.  Don't get overwhelmed.  Read the information that I sent you about AFIB.  At our next talk we will discuss Hypertension. BP Readings from Last 3 Encounters:  01/22/23 136/78  01/01/23 129/80  12/27/22 123/85         Patient Stated                Our next appointment is by telephone on 03/20/23 at 230 pm  Please call the care guide team at [phone] if you need to cancel or reschedule your appointment.   If you are  experiencing a Mental Health or Behavioral Health Crisis or need someone to talk to, please call the Suicide and Crisis Lifeline: 988  Patient verbalizes understanding of instructions and care plan provided today and agrees to view in MyChart. Active MyChart status and patient understanding of how to access instructions and care plan via MyChart confirmed with patient.     Juanell Fairly RN, BSN, [facility]  [facility], [facility]  Care Coordinator Phone: [phone]

## 2023-02-15 NOTE — Progress Notes (Signed)
02/15/2023 Name: Lisa Gutierrez MRN: [id number] DOB: Jul 09, 1950  Chief Complaint  Patient presents with   Medication Assistance    Lisa Gutierrez is a 72 y.o. year old female who presented for a telephone visit.   They were referred to the pharmacist by their Case Management Team  for assistance in managing diabetes and medication access She calls today to report endocrinology initiated rybelsus 3mg  daily but it is >$100 at the pharmacy.   Subjective:  Care Team: Primary Care Provider: [name], [place]  Medication Access/Adherence  Current Pharmacy:  [facility] [phone] [place], [place] - [address] [place] [address] Phone: [phone] Fax: [phone]  [facility] - [place], [place] - [address], [address], [address] [place] [address] Phone: [phone] Fax: [phone]  [place], [place] - [address] [place] [address] Phone: [phone] Fax: [phone]    Diabetes:  Current medications: jardiance 25mg  daily, metformin 500mg  BID Medications tried in the past: jaumet (discontinued by PCP when starting ozempic), ozempic (intolerance, GI upset)  Current glucose readings: 170-180s  Medication access:jardiance via Noreene Larsson Simcox CPhT   Objective:  Lab Results  Component Value Date   HGBA1C 9.1 12/27/2022    Lab Results  Component Value Date   CREATININE 0.69 12/04/2022   BUN 13 12/04/2022   NA 137 12/04/2022   K 4.4 12/04/2022   CL 98 12/04/2022   CO2 21 12/04/2022    Lab Results  Component Value Date   CHOL 195 08/24/2021   HDL 31 (L) 08/24/2021   LDLCALC 123 (H) 08/24/2021   TRIG 233 (H) 08/24/2021   CHOLHDL 6.3 (H) 08/24/2021    Medications Reviewed Today     Reviewed by Gabriel Carina, RPH (Pharmacist) on 02/15/23 at 1639  Med List Status: <None>   Medication Order Taking? Sig Documenting Provider Last Dose  Status Informant  acetaminophen (TYLENOL) 500 MG tablet [id number] No Take 2 tablets by mouth as needed. [provider] Taking Active Self, Pharmacy Records           Med Note Luster Landsberg, TIFFANI S   Tue Jul 10, 2022  5:44 PM)    ALPRAZolam Prudy Feeler) 0.5 MG tablet [id number] No Take 1 tablet (0.5 mg total) by mouth at bedtime as needed for anxiety or sleep. [name], PA Taking Active   apixaban (ELIQUIS) 5 MG TABS tablet [id number] No Take 1 tablet (5 mg total) by mouth 2 (two) times daily. Iran Ouch, MD Taking Active   Bioflavonoid Products (VITAMIN C) CHEW [id number] No Chew by mouth daily. With vitamin D and elderberry. 2 chews daily [provider] Taking Active Self, Pharmacy Records  Blood Glucose Monitoring Suppl DEVI [id number]  1 each by Does not apply route in the morning, at noon, and at bedtime. May substitute to any manufacturer covered by patient's insurance. [name], PA  Active   busPIRone (BUSPAR) 5 MG tablet [id number]  Take 1 tablet (5 mg total) by mouth 3 (three) times daily. [name], MD  Active   Calcium Carbonate (CALCIUM 500 PO) [id number] No Take 500 mg by mouth 2 (two) times daily. [provider] Taking Active Self, Pharmacy Records  clobetasol ointment (TEMOVATE) 0.05 % [id number] No Apply topically 2 (two) times daily. Apply topically to affected twice daily until improved. Carlean Jews, NP Taking Active  Med Note Littie Deeds, CHERYL A   Mon Dec 03, 2022  4:43 PM) When needed  digoxin (LANOXIN) 0.25 MG tablet [id number] No TAKE 1 TABLET BY MOUTH EVERY DAY Hammock, Sheri, NP Taking Active   empagliflozin (JARDIANCE) 25 MG TABS tablet [id number]  Take 1 tablet (25 mg total) by mouth daily before breakfast. [name] A, PA  Active   Flaxseed, Linseed, (FLAXSEED OIL PO) [id number] No Take 1,500 mg by mouth 2 (two) times daily. [provider] Taking Active Self, Pharmacy Records  furosemide (LASIX) 20 MG  tablet [id number] No TAKE 1 TABLET BY MOUTH EVERY DAY Hammock, Sheri, NP Taking Active            Med Note Littie Deeds, CHERYL A   Mon Dec 03, 2022  4:45 PM) If needed for swelling/fuild retention  Glucose Blood (BLOOD GLUCOSE TEST STRIPS) STRP [id number]  1 each by In Vitro route in the morning, at noon, and at bedtime. May substitute to any manufacturer covered by patient's insurance. [name], PA  Active   glucose blood test strip [id number]  Use as instructed [name], PA  Active   LINZESS 145 MCG CAPS capsule [id number] No Take 1 capsule (145 mcg total) by mouth daily. [name], PA Taking Active   losartan (COZAAR) 25 MG tablet [id number] No Take 1 tablet (25 mg total) by mouth daily. Charlsie Quest, NP Taking Active   metFORMIN (GLUCOPHAGE-XR) 500 MG 24 hr tablet [id number] No Take 1 tablet (500 mg total) by mouth 2 (two) times daily with a meal. [name] A, PA Taking Active   metoprolol tartrate (LOPRESSOR) 50 MG tablet [id number]  TAKE 1.5 TABLETS BY MOUTH 2 TIMES DAILY. Charlsie Quest, NP  Active   montelukast (SINGULAIR) 10 MG tablet [id number] No Take 1 tablet (10 mg total) by mouth at bedtime. [name], PA Taking Active   Multiple Vitamins-Minerals (MULTIVITAMIN ADULT PO) [id number] No Take 1 tablet by mouth daily. [provider] Taking Active Self, Pharmacy Records  ondansetron (ZOFRAN) 4 MG tablet [id number] No Take 1 tablet (4 mg total) by mouth every 8 (eight) hours as needed for nausea or vomiting. Creig Hines, MD Taking Active   pantoprazole (PROTONIX) 40 MG tablet [id number] No TAKE 1 TABLET BY MOUTH TWICE A DAY Boscia, Heather E, NP Taking Active   venlafaxine XR (EFFEXOR-XR) 150 MG 24 hr capsule [id number]  Take 1 capsule (150 mg total) by mouth daily with breakfast. Take total of 187.5 mg daily. Take along with 37.5 mg cap Hisada, Barbee Cough, MD  Active   venlafaxine XR (EFFEXOR-XR) 37.5 MG 24 hr capsule [id number]  Take 1 capsule (37.5 mg total)  by mouth daily with breakfast. Take total of 187.5 mg daily. Take along with 150 mg cap Hisada, Reina, MD  Active               Assessment/Plan:   Diabetes: - Currently uncontrolled, but adjusting medications - Reviewed goal A1c, goal fasting, and goal 2 hour post prandial glucose - Recommend to continue metformin 500mg  BID and jardiance 25mg  daily. Pcp has consulted endocrinology for DM management. Goal to obtain rybelsus sample prior to completing patient assistance, to first assess tolerance of medication. She is working with endocrinology and PCP office to inquire for sample.    Follow Up Plan: none scheduled at this time, recommended patient keep endocrinology follow ups, patient has contact number for future pharmacist support   Lynnda Shields, PharmD, BCPS Clinical Pharmacist [facility]  Primary Care

## 2023-02-26 ENCOUNTER — Encounter: Payer: Self-pay | Admitting: *Deleted

## 2023-02-26 NOTE — Progress Notes (Unsigned)
Electrophysiology Office Note:    Date:  02/27/2023   ID:  Lisa Gutierrez, DOB 1951-01-27, MRN [id number]  [facility] Cardiologist:  Lorine Bears, MD  [facility] Electrophysiologist:  Lanier Prude, MD   Referring MD: Charlsie Quest, NP   Chief Complaint: AF  History of Present Illness:      Discussed the use of AI scribe software for clinical note transcription with the patient, who gave verbal consent to proceed.  History of Present Illness   Lisa Gutierrez, a 72 year old woman with a history of hypertension, GERD, IBS, gastritis, fatty liver disease, diabetes, anxiety, insomnia, and morbid obesity, presents for evaluation of atrial fibrillation. She was diagnosed with atrial fibrillation when she presented for cataract surgery, which was subsequently cancelled. She is currently on Eliquis for stroke prophylaxis, digoxin, and metoprolol. She reports shortness of breath, dizziness, and increased anxiety when in atrial fibrillation. She also reports fatigue and decreased activity tolerance, stating that she "doesn't want to do anything" and "doesn't have the strength." She describes being wiped out after doing simple tasks like washing clothes and has difficulty walking. She had a cardioversion in the past, which was successful for about six weeks before her atrial fibrillation returned. She is apprehensive about repeat cardioversion.               Their past medical, social and family history was reveiwed.   ROS:   Please see the history of present illness.    All other systems reviewed and are negative.  EKGs/Labs/Other Studies Reviewed:    The following studies were reviewed today:  09/03/2022 ECG shows sinus with low amplitude p waves/ qrs complexes  07/10/2022 Echo - EF 50         Physical Exam:    VS:  BP 120/82 (BP Location: Left Arm, Patient Position: Sitting, Cuff Size: Large)   Pulse 88   Ht 5\' 6"  (1.676 m)   Wt 222 lb (100.7 kg)    SpO2 98%   BMI 35.83 kg/m     Wt Readings from Last 3 Encounters:  02/27/23 222 lb (100.7 kg)  01/22/23 223 lb 12.8 oz (101.5 kg)  01/01/23 228 lb 9.6 oz (103.7 kg)     Physical Exam   GENERAL: Obese female, no distress, no increased work of breathing. CARDIOVASCULAR: Regular rate and rhythm. EXTREMITIES: Warm extremities.           ASSESSMENT AND PLAN:    1. Paroxysmal atrial fibrillation (HCC)   2. Essential hypertension       Assessment and Plan    Atrial Fibrillation Symptomatic with fatigue and decreased exercise tolerance. Previously attempted cardioversion with recurrence of Afib. Patient is aware of when she is in Afib. Discussed the benefits of rhythm control and the higher success rate of ablation compared to medications. -Plan for outpatient atrial fibrillation ablation procedure. -Continue Eliquis for stroke prophylaxis.  Hypertension, GERD, IBS, Gastritis, Fatty Liver Disease, Diabetes, Anxiety, Insomnia, Morbid Obesity Chronic conditions, no changes discussed in this visit. -Continue current management.  Follow-up Carly to discuss scheduling of ablation procedure.      Discussed treatment options today for AF including antiarrhythmic drug therapy and ablation. Discussed risks, recovery and likelihood of success with each treatment strategy. Risk, benefits, and alternatives to EP study and ablation for afib were discussed. These risks include but are not limited to stroke, bleeding, vascular damage, tamponade, perforation, damage to the esophagus, lungs, phrenic nerve and other structures, pulmonary vein stenosis, worsening renal function,  coronary vasospasm and death.  Discussed potential need for repeat ablation procedures and antiarrhythmic drugs after an initial ablation. The patient understands these risk and wishes to proceed.  We will therefore proceed with catheter ablation at the next available time.  Carto, ICE, anesthesia are requested for the  procedure.  Will also obtain CT PV protocol prior to the procedure to exclude LAA thrombus and further evaluate atrial anatomy.          Signed, Rossie Muskrat. Lalla Brothers, MD, [facility], [facility] 02/27/2023 10:00 AM    Electrophysiology [facility]

## 2023-02-27 ENCOUNTER — Ambulatory Visit: Payer: Medicare HMO | Attending: Cardiology | Admitting: Cardiology

## 2023-02-27 ENCOUNTER — Encounter: Payer: Self-pay | Admitting: Cardiology

## 2023-02-27 VITALS — BP 120/82 | HR 88 | Ht 66.0 in | Wt 222.0 lb

## 2023-02-27 DIAGNOSIS — I10 Essential (primary) hypertension: Secondary | ICD-10-CM

## 2023-02-27 DIAGNOSIS — I480 Paroxysmal atrial fibrillation: Secondary | ICD-10-CM | POA: Diagnosis not present

## 2023-02-27 NOTE — Patient Instructions (Addendum)
Medication Instructions:  Your physician recommends that you continue on your current medications as directed. Please refer to the Current Medication list given to you today.  *If you need a refill on your cardiac medications before your next appointment, please call your pharmacy*  Lab Work: BMET and CBC prior to your CT scan and ablation   Testing/Procedures: Your physician has requested that you have cardiac CT. Cardiac computed tomography (CT) is a painless test that uses an x-ray machine to take clear, detailed pictures of your heart. For further information please visit [email or web address] We will call you to schedule your CT scan. It will be done about 2-3 weeks prior to your ablation.   Your physician has recommended that you have an ablation. Catheter ablation is a medical procedure used to treat some cardiac arrhythmias (irregular heartbeats). During catheter ablation, a long, thin, flexible tube is put into a blood vessel in your groin (upper thigh), or neck. This tube is called an ablation catheter. It is then guided to your heart through the blood vessel. Radio frequency waves destroy small areas of heart tissue where abnormal heartbeats may cause an arrhythmia to start.  You are scheduled for Atrial Fibrillation Ablation on Thursday, January 16 with Dr. Steffanie Dunn.Please arrive at the Main Entrance A at [facility]: [address] [place], [place] [address] at 5:30 AM    Follow-Up: At [facility], you and your health needs are our priority.  As part of our continuing mission to provide you with exceptional heart care, we have created designated Provider Care Teams.  These Care Teams include your primary Cardiologist (physician) and Advanced Practice Providers (APPs -  Physician Assistants and Nurse Practitioners) who all work together to provide you with the care you need, when you need it.   Your next appointment:   We will call you to arrange your follow  up appointment

## 2023-02-28 ENCOUNTER — Telehealth: Payer: Self-pay | Admitting: *Deleted

## 2023-02-28 NOTE — Patient Outreach (Signed)
  Care Coordination   02/28/2023 Name: Ezlynn Cobo MRN: [id number] DOB: Jul 07, 1950   Care Coordination Outreach Attempts:  An unsuccessful telephone outreach was attempted today to offer the patient information about available care coordination services.  Follow Up Plan:  Additional outreach attempts will be made to offer the patient care coordination information and services.   Encounter Outcome:  No Answer   Care Coordination Interventions:  No, not indicated    Reece Levy, MSW, LCSW [facility]  [facility], [facility] Licensed Clinical Social Worker Care Coordinator  [phone]

## 2023-03-03 ENCOUNTER — Other Ambulatory Visit: Payer: Self-pay | Admitting: Family Medicine

## 2023-03-03 DIAGNOSIS — E1165 Type 2 diabetes mellitus with hyperglycemia: Secondary | ICD-10-CM

## 2023-03-04 ENCOUNTER — Other Ambulatory Visit: Payer: Self-pay | Admitting: Cardiology

## 2023-03-05 ENCOUNTER — Telehealth: Payer: Self-pay | Admitting: *Deleted

## 2023-03-05 NOTE — Progress Notes (Signed)
  Care Coordination Note  03/05/2023 Name: Lisa Gutierrez MRN: [id number] DOB: 12/16/1950  Lisa Gutierrez is a 72 y.o. year old female who is a primary care patient of [name], [place] and is actively engaged with the care management team. I reached out to [name] by phone today to assist with re-scheduling a follow up visit with the Licensed Clinical Social Worker  Follow up plan: Unsuccessful telephone outreach attempt made. A HIPAA compliant phone message was left for the patient providing contact information and requesting a return call.   Burman Nieves, CCMA Care Coordination Care Guide Direct Dial: [phone]

## 2023-03-06 ENCOUNTER — Other Ambulatory Visit: Payer: Self-pay | Admitting: Cardiovascular Disease

## 2023-03-06 NOTE — Telephone Encounter (Signed)
Refill Request.  

## 2023-03-06 NOTE — Telephone Encounter (Signed)
Prescription refill request for Eliquis received. Indication:AFIB Last office visit:11/24 Scr:0.69  8/24 Age: 72 Weight:100.7  kg  Prescription refilled

## 2023-03-07 ENCOUNTER — Other Ambulatory Visit: Payer: Self-pay

## 2023-03-07 ENCOUNTER — Other Ambulatory Visit: Payer: Self-pay | Admitting: Pharmacy Technician

## 2023-03-07 ENCOUNTER — Telehealth: Payer: Self-pay | Admitting: Cardiovascular Disease

## 2023-03-07 DIAGNOSIS — Z5986 Financial insecurity: Secondary | ICD-10-CM

## 2023-03-07 NOTE — Progress Notes (Signed)
Pharmacy Medication Assistance Program Note    03/07/2023  Patient ID: Lisa Gutierrez, female   DOB: March 10, 1951, 72 y.o.   MRN: [id number]     03/07/2023  Outreach Medication One  Manufacturer Medication One [employer] [facility] Linzess  Dose of Linzess  Type of Assistance Manufacturer Assistance  Date Application Received From Patient 03/06/2023  Application Items Received From Patient Application;Proof of Income;Other  Date Application Received From Provider 02/08/2023  Date Application Submitted to Manufacturer 03/06/2023  Method Application Sent to Manufacturer Fax          03/07/2023  Outreach Medication Two  Manufacturer Medication Two [employer]  [employer] Drugs Jardiance  Dose of Jardiance 25mg   Type of Radiographer, therapeutic Assistance  Date Application Received From Patient 03/06/2023  Date Application Received From Provider 02/08/2023  Method Application Sent to Manufacturer Fax  Date Application Submitted to Manufacturer 03/06/2023       Signature   [facility]  Office: [phone] Fax: [phone] Email: [email or web address]

## 2023-03-07 NOTE — Telephone Encounter (Signed)
RX routed to [facility]

## 2023-03-07 NOTE — Telephone Encounter (Signed)
*  STAT* If patient is at the pharmacy, call can be transferred to refill team.   1. Which medications need to be refilled? (please list name of each medication and dose if known)  ELIQUIS 5 MG TABS tablet  2. Which pharmacy/location (including street and city if local pharmacy) is medication to be sent to?[facility] 3. Do they need a 30 day or 90 day supply? 90  They received faxed but some of it cut off. They as that you re-fax. Fax# [phone]

## 2023-03-07 NOTE — Progress Notes (Signed)
  Care Coordination Note  03/07/2023 Name: Analaura Bester MRN: [id number] DOB: 1950/07/13  Shanquia Helmig is a 72 y.o. year old female who is a primary care patient of [name], [place] and is actively engaged with the care management team. I reached out to [name] by phone today to assist with re-scheduling a follow up visit with the Licensed Clinical Social Worker  Follow up plan: Telephone appointment with care management team member scheduled for: 03/18/2023  Burman Nieves, [facility] Care Coordination Care Guide Direct Dial: [phone]

## 2023-03-08 ENCOUNTER — Other Ambulatory Visit: Payer: Self-pay | Admitting: Pharmacy Technician

## 2023-03-08 DIAGNOSIS — Z5986 Financial insecurity: Secondary | ICD-10-CM

## 2023-03-08 NOTE — Progress Notes (Signed)
Pharmacy Medication Assistance Program Note    03/08/2023  Patient ID: Lisa Gutierrez, female  DOB: 1951/03/18, 72 y.o.  MRN:  [id number]     03/08/2023  Outreach Medication Three  Initial Outreach Date (Medication Three) 08/16/2022  Manufacturer Medication Three [employer]  [employer] Drugs (Med Three) Eliquis  Dose of Eliquis 5mg   Type of Assistance Manufacturer Assistance  Date Application Sent to Patient 08/16/2022  Application Items Requested Application;Proof of Income;Proof of Out of Pocket Spend;Other  Date Application Sent to Prescriber 08/16/2022  Name of Prescriber Lorine Bears  Date Application Received From Patient 03/05/2023  Application Items Received From Patient Application;Proof of Income;Proof of Out of Pocket Spend;Other  Date Application Received From Provider 09/11/2022  Date Application Submitted to Manufacturer 03/05/2023  Method Application Sent to Manufacturer Fax  Patient Assistance Determination Approved  Approval Start Date 03/05/2023  Approval End Date 04/16/2023  Patient Notification Method Telephone Call  Additional Outreach Contact Manufacturer     Care coordination call placed to [employer] in regard to Eliquis application. Spoke to Nelson who informs patient is APPROVED 03/05/23-03/3123. She informs that patient can call [employer] next week to check on shipment if medication has not arrived at her home. The number to [employer] is [phone]. Successful outreach to patient. HIPAA verified. Notified patient of her approval and provided phone number to [facility]. Patient verbalized understanding.  SIGNATURE  Kristopher Glee Haynes  Office: [phone] Fax: [phone] Email: [email or web address]

## 2023-03-11 ENCOUNTER — Telehealth: Payer: Self-pay

## 2023-03-11 NOTE — Telephone Encounter (Signed)
I understand. Advise her to reduce buspar to 5 mg twice a day until the next visit.

## 2023-03-11 NOTE — Telephone Encounter (Signed)
left message with new instruction for the buspar

## 2023-03-11 NOTE — Telephone Encounter (Signed)
pt left a message that she wants to cut back to only 2 pills of the buspar because she is staying sleepy all the time.   Pt was last seen on 10-22 next appt 12-13.  Tried to call pt back to get more details. Not answer left a message to return call.

## 2023-03-13 ENCOUNTER — Other Ambulatory Visit: Payer: Self-pay | Admitting: Family Medicine

## 2023-03-13 DIAGNOSIS — E1165 Type 2 diabetes mellitus with hyperglycemia: Secondary | ICD-10-CM

## 2023-03-18 ENCOUNTER — Encounter: Payer: Self-pay | Admitting: *Deleted

## 2023-03-18 DIAGNOSIS — E1159 Type 2 diabetes mellitus with other circulatory complications: Secondary | ICD-10-CM | POA: Diagnosis not present

## 2023-03-18 DIAGNOSIS — E1169 Type 2 diabetes mellitus with other specified complication: Secondary | ICD-10-CM | POA: Diagnosis not present

## 2023-03-18 DIAGNOSIS — E1165 Type 2 diabetes mellitus with hyperglycemia: Secondary | ICD-10-CM | POA: Diagnosis not present

## 2023-03-18 DIAGNOSIS — I152 Hypertension secondary to endocrine disorders: Secondary | ICD-10-CM | POA: Diagnosis not present

## 2023-03-18 DIAGNOSIS — E669 Obesity, unspecified: Secondary | ICD-10-CM | POA: Diagnosis not present

## 2023-03-20 ENCOUNTER — Ambulatory Visit: Payer: Self-pay

## 2023-03-20 NOTE — Patient Outreach (Signed)
  Care Coordination   Follow Up Visit Note   03/20/2023 Name: Lisa Gutierrez MRN: [id number] DOB: 1950-07-14  Lisa Gutierrez is a 72 y.o. year old female who sees Lisa Gutierrez, [place] for primary care. I spoke with  Lisa Gutierrez by phone today.  What matters to the patients health and wellness today?   I spoke with Lisa Gutierrez today, and she informed me that she has been unwell for the past two days, experiencing nausea, vomiting, and diarrhea. She has been adhering to the [employer] but continues to feel fatigued. Consequently, we have rescheduled her appointment for a later date.      SDOH assessments and interventions completed:  No     Care Coordination Interventions:  Yes, provided   Interventions Today    Flowsheet Row Most Recent Value  Chronic Disease   Chronic disease during today's visit Chronic Obstructive Pulmonary Disease (COPD), Atrial Fibrillation (AFib)  General Interventions   General Interventions Discussed/Reviewed General Interventions Discussed  [rescheduled her appointment]       Follow up plan: Follow up call scheduled for 03/27/23  130 pm    Encounter Outcome:  Patient Visit Completed   Juanell Fairly RN, BSN, [facility] [place]  [facility], [facility]  Care Coordinator Phone: [phone]

## 2023-03-21 ENCOUNTER — Other Ambulatory Visit: Payer: Self-pay | Admitting: Family Medicine

## 2023-03-21 ENCOUNTER — Telehealth: Payer: Self-pay

## 2023-03-21 DIAGNOSIS — E1165 Type 2 diabetes mellitus with hyperglycemia: Secondary | ICD-10-CM

## 2023-03-21 DIAGNOSIS — I152 Hypertension secondary to endocrine disorders: Secondary | ICD-10-CM

## 2023-03-21 DIAGNOSIS — E66812 Obesity, class 2: Secondary | ICD-10-CM

## 2023-03-21 DIAGNOSIS — K760 Fatty (change of) liver, not elsewhere classified: Secondary | ICD-10-CM

## 2023-03-21 DIAGNOSIS — F411 Generalized anxiety disorder: Secondary | ICD-10-CM

## 2023-03-21 NOTE — Telephone Encounter (Signed)
Patient has been notified that labs are released so that she can have done at [facility]. All questions and concerns have been addressed.

## 2023-03-21 NOTE — Telephone Encounter (Signed)
I ordered the labs on 12/27/2022 (CMP, CBC, A1c, lipid panel, TSH, vitamin D, CA125) so I think they should be able to just be released but let me know if I need to review!

## 2023-03-21 NOTE — Telephone Encounter (Signed)
Copied from CRM [id number]. Topic: Clinical - Request for Lab/Test Order >> Mar 21, 2023 10:07 AM Hector Shade B wrote: Reason for CRM: Patient is asking can she go ahead and do her labs that the [facility] closer to her address instead of driving such great distance to get labs done.

## 2023-03-21 NOTE — Telephone Encounter (Signed)
 Which labs need to be ordered?

## 2023-03-24 ENCOUNTER — Other Ambulatory Visit: Payer: Self-pay | Admitting: Cardiology
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
## 2023-03-25 ENCOUNTER — Telehealth: Payer: Self-pay | Admitting: Cardiology

## 2023-03-25 MED ORDER — DIGOXIN 250 MCG PO TABS: 250.0000 ug | ORAL_TABLET | Freq: Every day | ORAL | 0 refills | Status: DC

## 2023-03-25 NOTE — Telephone Encounter (Signed)
*  STAT* If patient is at the pharmacy, call can be transferred to refill team.   1. Which medications need to be refilled? (please list name of each medication and dose if known) digoxin (LANOXIN) 0.25 MG tablet   2. Which pharmacy/location (including street and city if local pharmacy) is medication to be sent to?  [id number] - [place], [place] - [address]      3. Do they need a 30 day or 90 day supply? 90 day

## 2023-03-26 ENCOUNTER — Other Ambulatory Visit: Payer: Medicare HMO

## 2023-03-27 ENCOUNTER — Telehealth: Payer: Self-pay

## 2023-03-27 ENCOUNTER — Ambulatory Visit: Payer: Self-pay

## 2023-03-27 ENCOUNTER — Ambulatory Visit: Payer: Self-pay | Admitting: *Deleted

## 2023-03-27 DIAGNOSIS — Z79899 Other long term (current) drug therapy: Secondary | ICD-10-CM

## 2023-03-27 DIAGNOSIS — E1165 Type 2 diabetes mellitus with hyperglycemia: Secondary | ICD-10-CM

## 2023-03-27 LAB — CBC WITH DIFFERENTIAL/PLATELET
Basophils Absolute: 0.1 10*3/uL (ref 0.0–0.2)
Basos: 1 %
EOS (ABSOLUTE): 0 10*3/uL (ref 0.0–0.4)
Eos: 0 %
Hematocrit: 45.1 % (ref 34.0–46.6)
Hemoglobin: 14.4 g/dL (ref 11.1–15.9)
Immature Grans (Abs): 0.1 10*3/uL (ref 0.0–0.1)
Immature Granulocytes: 1 %
Lymphocytes Absolute: 2.1 10*3/uL (ref 0.7–3.1)
Lymphs: 24 %
MCH: 29.7 pg (ref 26.6–33.0)
MCHC: 31.9 g/dL (ref 31.5–35.7)
MCV: 93 fL (ref 79–97)
Monocytes Absolute: 0.7 10*3/uL (ref 0.1–0.9)
Monocytes: 8 %
Neutrophils Absolute: 5.8 10*3/uL (ref 1.4–7.0)
Neutrophils: 66 %
Platelets: 235 10*3/uL (ref 150–450)
RBC: 4.85 x10E6/uL (ref 3.77–5.28)
RDW: 12.8 % (ref 11.7–15.4)
WBC: 8.6 10*3/uL (ref 3.4–10.8)

## 2023-03-27 LAB — COMPREHENSIVE METABOLIC PANEL
ALT: 25 [IU]/L (ref 0–32)
AST: 25 [IU]/L (ref 0–40)
Albumin: 4.2 g/dL (ref 3.8–4.8)
Alkaline Phosphatase: 68 [IU]/L (ref 44–121)
BUN/Creatinine Ratio: 16 (ref 12–28)
BUN: 11 mg/dL (ref 8–27)
Bilirubin Total: 0.3 mg/dL (ref 0.0–1.2)
CO2: 18 mmol/L — ABNORMAL LOW (ref 20–29)
Calcium: 10.2 mg/dL (ref 8.7–10.3)
Chloride: 101 mmol/L (ref 96–106)
Creatinine, Ser: 0.68 mg/dL (ref 0.57–1.00)
Globulin, Total: 2.8 g/dL (ref 1.5–4.5)
Glucose: 233 mg/dL — ABNORMAL HIGH (ref 70–99)
Potassium: 4.2 mmol/L (ref 3.5–5.2)
Sodium: 137 mmol/L (ref 134–144)
Total Protein: 7 g/dL (ref 6.0–8.5)
eGFR: 92 mL/min/{1.73_m2} (ref >59)

## 2023-03-27 LAB — TSH RFX ON ABNORMAL TO FREE T4: TSH: 0.814 u[IU]/mL (ref 0.450–4.500)

## 2023-03-27 LAB — LIPID PANEL
Chol/HDL Ratio: 6.8 {ratio} — ABNORMAL HIGH (ref 0.0–4.4)
Cholesterol, Total: 163 mg/dL (ref 100–199)
HDL: 24 mg/dL — ABNORMAL LOW (ref >39)
LDL Chol Calc (NIH): 83 mg/dL (ref 0–99)
Triglycerides: 340 mg/dL — ABNORMAL HIGH (ref 0–149)
VLDL Cholesterol Cal: 56 mg/dL — ABNORMAL HIGH (ref 5–40)

## 2023-03-27 LAB — VITAMIN D 25 HYDROXY (VIT D DEFICIENCY, FRACTURES): Vit D, 25-Hydroxy: 24.4 ng/mL — ABNORMAL LOW (ref 30.0–100.0)

## 2023-03-27 LAB — HEMOGLOBIN A1C
Est. average glucose Bld gHb Est-mCnc: 217 mg/dL
Hgb A1c MFr Bld: 9.2 % — ABNORMAL HIGH (ref 4.8–5.6)

## 2023-03-27 NOTE — Patient Instructions (Signed)
Visit Information  Thank you for taking time to visit with me today. Please don't hesitate to contact me if I can be of assistance to you.   Following are the goals we discussed today:   Goals Addressed             This Visit's Progress    I want help to understand my conditions so I can prepare for the furture       Patient Goals/Self Care Activities: -Patient/Caregiver will self-administer medications as prescribed as evidenced by self-report/primary caregiver report  -Patient/Caregiver will attend all scheduled provider appointments as evidenced by clinician review of documented attendance to scheduled appointments and patient/caregiver report -Patient/Caregiver will call pharmacy for medication refills as evidenced by patient report and review of pharmacy fill history as appropriate -Patient/Caregiver will call provider office for new concerns or questions as evidenced by review of documented incoming telephone call notes and patient report -Patient/Caregiver will focus on medication adherence by taking medications as prescribed  -Checks BP and records as discussed -check blood sugar at prescribed times -record values and write them down take them to all doctor visits   -Take the medications prescribed to control your heart rate and rhythm and reduce your risk of blood clot formation (anticoagulants or antiplatelet medications/"blood thinners"). -Learn to check your pulse and write down the number every day.   BP Readings from Last 3 Encounters:  02/27/23 120/82  01/22/23 136/78  01/01/23 129/80   Lab Results  Component Value Date   HGBA1C 9.2 (H) 03/26/2023             Our next appointment is by telephone on 05/15/23 at 130 pm  Please call the care guide team at [phone] if you need to cancel or reschedule your appointment.   If you are experiencing a Mental Health or Behavioral Health Crisis or need someone to talk to, please call 1-800-273-TALK (toll free, 24  hour hotline)  Patient verbalizes understanding of instructions and care plan provided today and agrees to view in MyChart. Active MyChart status and patient understanding of how to access instructions and care plan via MyChart confirmed with patient.     Juanell Fairly RN, BSN, [facility] [place]  [facility], [facility]  Care Coordinator Phone: [phone]

## 2023-03-27 NOTE — Patient Instructions (Signed)
Visit Information  Thank you for taking time to visit with me today. Please don't hesitate to contact me if I can be of assistance to you.   Following are the goals we discussed today:   Goals Addressed             This Visit's Progress    COMPLETED: Reduce symptoms related to depression and  anxiety       Activities and task to complete in order to accomplish goals.   Continue with medication changes made recently by Dr hisada- discuss concerns or other requests with her also Review and consider the additional support resources mailed to you Find ways to relax and reduce your anxiety- email with material being sent today Find ways to remove yourself from conversations that are upsetting  Call your insurance provider for more information about your Enhanced Benefits ( ) Start / continue relaxed breathing 3 times daily Keep all upcoming appointment discussed today Continue with compliance of taking medication prescribed by Doctor Consider plans for on-going plans for behavioral health care as discussed Call 988 for mental health support/counselor 24/7   Call 911 for emergent need Consider local Hospice agency for free grief counseling and support          Please call the care guide team at [phone] if you need to reconnect and get scheduled for appointment.   If you are experiencing a Mental Health or Behavioral Health Crisis or need someone to talk to, please call the Suicide and Crisis Lifeline: 988 call 911   The patient verbalized understanding of instructions, educational materials, and care plan provided today and DECLINED offer to receive copy of patient instructions, educational materials, and care plan.   No further follow up required:    Reece Levy, MSW, LCSW [place], [facility] Licensed Clinical Social Worker Care Coordinator  [phone]

## 2023-03-27 NOTE — Patient Outreach (Addendum)
  Care Coordination   Follow Up Visit Note   03/27/2023 Name: Shenandoah Robillard MRN: [id number] DOB: 1951/01/07  Janna Booker is a 72 y.o. year old female who sees [name], [place] for primary care. I spoke with  [name] by phone today.  What matters to the patients health and wellness today?  I started a new medicine for DM and adjusting to it.    Goals Addressed             This Visit's Progress    COMPLETED: Reduce symptoms related to depression and  anxiety       Activities and task to complete in order to accomplish goals.   Continue with medication changes made recently by Dr hisada- discuss concerns or other requests with her also Review and consider the additional support resources mailed to you Find ways to relax and reduce your anxiety- email with material being sent today Find ways to remove yourself from conversations that are upsetting  Call your insurance provider for more information about your Enhanced Benefits ( ) Start / continue relaxed breathing 3 times daily Keep all upcoming appointment discussed today Continue with compliance of taking medication prescribed by Doctor Consider plans for on-going plans for behavioral health care as discussed Call 988 for mental health support/counselor 24/7   Call 911 for emergent need Consider local Hospice agency for free grief counseling and support         SDOH assessments and interventions completed:  Yes     Care Coordination Interventions:  Yes, provided  Interventions Today    Flowsheet Row Most Recent Value  Chronic Disease   Chronic disease during today's visit Diabetes  [startednew RX- still dizzy/weak]  General Interventions   General Interventions Discussed/Reviewed General Interventions Discussed, Psychiatrist Interventions   Education Provided Provided Education  Provided Verbal Education On [employer], [employer], Mental  Health/Coping with Illness, Medication  [Pt worried about dropping pills sometimes- suggested she might ask [facility] about prepackaged options. She plans to change to HTA plan in 2025 and pleased that all meds will be free]  Mental Health Interventions   Mental Health Discussed/Reviewed Mental Health Discussed, Mental Health Reviewed, Coping Strategies, Anxiety  [Pt declines referral for counseling at this time- continues to see Dr Vanetta Shawl for Med Mgmt.]  Pharmacy Interventions   Pharmacy Dicussed/Reviewed Pharmacy Topics Discussed, Referral to Pharmacist  Francis Dowse is worried about messing up her meds- states she was taking wrong/double dose of Metformin bc she did not understand the instructions given. Suggested she might consider bubble pack for pills and other means to adhere- referral to Pharmacy made]       Follow up plan: No further intervention required. Pt declines further CSW outreach- advised pt she can be reconnected with our CSW team if needs arise.    Encounter Outcome:  Patient Visit Completed

## 2023-03-27 NOTE — Progress Notes (Unsigned)
   Care Guide Note  03/27/2023 Name: Lisa Gutierrez MRN: [id number] DOB: 06/17/1950  Referred by: [name], PA Reason for referral : Care Coordination (Outreach to schedule with Pharm d )   Lisa Gutierrez is a 72 y.o. year old female who is a primary care patient of [name], [place]. Lisa Gutierrez was referred to the pharmacist for assistance related to DM.    An unsuccessful telephone outreach was attempted today to contact the patient who was referred to the pharmacy team for assistance with medication management. Additional attempts will be made to contact the patient.   Lisa Gutierrez , RMA     [facility]  [facility], [facility] Guide  Direct Dial: [phone]  Website: [email or web address]

## 2023-03-27 NOTE — Patient Outreach (Signed)
Care Coordination   Follow Up Visit Note   03/27/2023 Name: Lisa Gutierrez MRN: [id number] DOB: May 10, 1950  Lisa Gutierrez is a 72 y.o. year old female who sees [name], [place] for primary care. I spoke with  [name] by phone today.  What matters to the patients health and wellness today?  Lisa Gutierrez is currently in a stable condition and making notable progress with her health management. Her blood pressure readings are consistently around 127/77 mmHg, indicating a healthy range, while her pulse rate fluctuates between 70 and 80 beats per minute. Today, her blood sugar level was measured at 172 mg/dL, which is a slight concern but manageable with her current treatment plan. She has recently begun a new medication called Rebelsus, which she has incorporated alongside her existing prescriptions. Early indications suggest that this new addition is having a positive effect on her overall well-being. Lisa Gutierrez is consistatant about monitoring her health; she checks her blood sugar and blood pressure daily, ensuring that she remains informed about her condition. Looking ahead, she is scheduled to undergo an ablation procedure on January 16th. While this upcoming procedure makes her feel a bit anxious, she is approaching it with a positive mindset, choosing to take her situation one day at a time. Importantly, she is committed to adhering to her medication regimen as prescribed, which she believes will aid her recovery process and help her manage her health effectively.     Goals Addressed             This Visit's Progress    I want help to understand my conditions so I can prepare for the furture       Patient Goals/Self Care Activities: -Patient/Caregiver will self-administer medications as prescribed as evidenced by self-report/primary caregiver report  -Patient/Caregiver will attend all scheduled provider appointments as evidenced by clinician review  of documented attendance to scheduled appointments and patient/caregiver report -Patient/Caregiver will call pharmacy for medication refills as evidenced by patient report and review of pharmacy fill history as appropriate -Patient/Caregiver will call provider office for new concerns or questions as evidenced by review of documented incoming telephone call notes and patient report -Patient/Caregiver will focus on medication adherence by taking medications as prescribed  -Checks BP and records as discussed -check blood sugar at prescribed times -record values and write them down take them to all doctor visits   -Take the medications prescribed to control your heart rate and rhythm and reduce your risk of blood clot formation (anticoagulants or antiplatelet medications/"blood thinners"). -Learn to check your pulse and write down the number every day.   BP Readings from Last 3 Encounters:  02/27/23 120/82  01/22/23 136/78  01/01/23 129/80   Lab Results  Component Value Date   HGBA1C 9.2 (H) 03/26/2023             SDOH assessments and interventions completed:  No     Care Coordination Interventions:  Yes, provided   Interventions Today    Flowsheet Row Most Recent Value  Chronic Disease   Chronic disease during today's visit Diabetes, Hypertension (HTN)  General Interventions   General Interventions Discussed/Reviewed General Interventions Discussed  Mental Health Interventions   Mental Health Discussed/Reviewed Anxiety  Nutrition Interventions   Nutrition Discussed/Reviewed Nutrition Discussed  Pharmacy Interventions   Pharmacy Dicussed/Reviewed Pharmacy Topics Discussed  Safety Interventions   Safety Discussed/Reviewed Safety Discussed        Follow up plan: Follow up call scheduled for 05/15/23  130 pm    Encounter Outcome:  Patient Visit Completed   Juanell Fairly RN, BSN, [facility]  [facility], [facility]  Care Coordinator Phone:  [phone]

## 2023-03-28 NOTE — Progress Notes (Signed)
   Care Guide Note  03/28/2023 Name: Lisa Gutierrez MRN: [id number] DOB: 1950/04/26  Referred by: [name], PA Reason for referral : Care Coordination (Outreach to schedule with Pharm d )   Lisa Gutierrez is a 72 y.o. year old female who is a primary care patient of [name], [place]. [name] was referred to the pharmacist for assistance related to DM.    Successful contact was made with the patient to discuss pharmacy services including being ready for the pharmacist to call at least 5 minutes before the scheduled appointment time, to have medication bottles and any blood sugar or blood pressure readings ready for review. The patient agreed to meet with the pharmacist via with the pharmacist via telephone visit on (date/time).  04/12/2023  [name] , RMA     [place]  [facility] Guide  Direct Dial: [phone]  Website: [email or web address]

## 2023-03-29 ENCOUNTER — Telehealth (INDEPENDENT_AMBULATORY_CARE_PROVIDER_SITE_OTHER): Payer: Medicare HMO | Admitting: Psychiatry

## 2023-03-29 ENCOUNTER — Encounter: Payer: Self-pay | Admitting: Psychiatry

## 2023-03-29 DIAGNOSIS — F331 Major depressive disorder, recurrent, moderate: Secondary | ICD-10-CM

## 2023-03-29 DIAGNOSIS — F411 Generalized anxiety disorder: Secondary | ICD-10-CM | POA: Diagnosis not present

## 2023-03-29 MED ORDER — BUSPIRONE HCL 5 MG PO TABS: ORAL_TABLET | ORAL | 1 refills | Status: DC

## 2023-03-29 MED ORDER — VENLAFAXINE HCL ER 37.5 MG PO CP24: 37.5000 mg | ORAL_CAPSULE | Freq: Every day | ORAL | 0 refills | Status: DC

## 2023-03-29 MED ORDER — VENLAFAXINE HCL ER 150 MG PO CP24: 150.0000 mg | ORAL_CAPSULE | Freq: Every day | ORAL | 0 refills | Status: DC

## 2023-03-29 NOTE — Patient Instructions (Signed)
Continue venlafaxine 187.5 mg daily Increase Buspar 5 mg daily, 10 mg in PM  Next appointment : 2/5 at 3 pm

## 2023-04-01 ENCOUNTER — Telehealth: Payer: Self-pay | Admitting: Pharmacy Technician

## 2023-04-01 DIAGNOSIS — Z5986 Financial insecurity: Secondary | ICD-10-CM

## 2023-04-01 NOTE — Progress Notes (Signed)
Pharmacy Medication Assistance Program Note    04/01/2023  Patient ID: Lisa Gutierrez, female   DOB: April 12, 1951, 72 y.o.   MRN: [id number]     03/07/2023 04/01/2023  Outreach Medication One  Manufacturer Medication One [employer] Drugs Linzess   Dose of Linzess   Type of Assistance Manufacturer Assistance   Date Application Received From Patient 03/06/2023   Application Items Received From Patient Application;Proof of Income;Other   Date Application Received From Provider 02/08/2023   Date Application Submitted to Manufacturer 03/06/2023   Method Application Sent to Manufacturer Fax   Patient Assistance Determination  Approved  Approval Start Date  04/17/2023  Approval End Date  04/15/2024  Patient Notification Method  Telephone Call  Telephone Call Outcome  Successful   Care coordination call placed to [employer] in regard to Linzess application. Spoke to [place] who informs patient is APPROVED 04/17/23-04/15/24. She informs patient will have to call [employer] to order her refills by calling [phone].     03/07/2023 04/01/2023  Outreach Medication Two  Manufacturer Medication Two [employer]   [employer] Drugs Jardiance   Dose of Jardiance 25mg    Type of Radiographer, therapeutic Assistance   Date Application Received From Patient 03/06/2023   Date Application Received From Provider 02/08/2023   Method Application Sent to Manufacturer Fax   Date Application Submitted to Manufacturer 03/06/2023   Patient Assistance Determination  Approved  Approval Start Date  04/17/2023  Patient Notification Method  Telephone Call  Telephone Call Outcome  Successful   Care coordination call placed to [employer] in regard to Jardiance application.  Spoke to [place] who informs patient is APPROVED 04/17/23-04/15/24. Patient will have to call [employer] to order her refills by calling [phone]. Patient is aware of her approvals.  Signature  Kristopher Glee [facility]   Office: [phone] Fax: [phone] Email: [name]

## 2023-04-02 ENCOUNTER — Ambulatory Visit: Payer: Medicare HMO | Admitting: Family Medicine

## 2023-04-04 NOTE — Telephone Encounter (Signed)
Left message for patient to review instructions for her upcoming CT scan and ablation. Advised to call back with any questions.

## 2023-04-05 ENCOUNTER — Other Ambulatory Visit: Payer: Self-pay | Admitting: Cardiology

## 2023-04-12 ENCOUNTER — Telehealth: Payer: Self-pay | Admitting: Cardiology

## 2023-04-12 ENCOUNTER — Other Ambulatory Visit: Payer: Self-pay | Admitting: Pharmacist

## 2023-04-12 DIAGNOSIS — I4891 Unspecified atrial fibrillation: Secondary | ICD-10-CM

## 2023-04-12 NOTE — Telephone Encounter (Signed)
Spoke with the patient and reviewed instructions for her CT scan and ablation. Patient verbalized understanding.

## 2023-04-12 NOTE — Progress Notes (Signed)
04/12/2023 Name: Lisa Gutierrez MRN: [id number] DOB: [birth year]  Chief Complaint  Patient presents with   Medication Management    Lisa Gutierrez is a 72 y.o. year old female who presented for a telephone visit.   They were referred to the pharmacist by their PCP for assistance in managing complex medication management.    Subjective:  Care Team: Primary Care Provider: [name], PA ; Next Scheduled Visit: 05/09/23 Clinical Pharmacist: Marlowe Aschoff, PharmD  Medication Access/Adherence  [phone] [place], [place] - [address] [place] [address] Phone: [phone] Fax: [phone]  [facility] - [place], [place] - [address], [address], [address] [place] [address] Phone: [phone] Fax: [phone]  [facility] [place], [place] - [address] [place] [address] Phone: [phone] Fax: [phone]   Patient reports affordability concerns with their medications: No  Patient reports access/transportation concerns to their pharmacy: No  - husband has to drive her everywhere Patient reports adherence concerns with their medications:  Yes  - has been told by husband that he keeps finding pills on the floor; worries she dropping some and also taking them incorrectly  Approved for Jardiance and Linzess for 2025; Endocrinology is working on [employer]; Eliquis received in November for 90DS- discussed need to reach 3% OOP Rx spend to qualify again next year   Medication Management:  Current adherence strategy: Weekly pill box  However, she worries that she thinks she has overlapped a medication and accidentally taken it twice or removed it altogether for the day  Patient reports Fair adherence to medications  Patient reports the following barriers to adherence: Confusion with medications and fear of placing them in wrong  slots  Recent fill dates:  Eliquis 5mg  01/26/23 30DS- past due (got 3 month supply from [facility]) Buspirone 5mg  03/30/23 30DS Digoxin 250 mcg 03/25/23 90DS Ultra test strip 03/12/23 30DS Losartan 25mg  02/21/23 90DS Metformin ER 500mg  04/11/23 30DS- picking up today; if will be latest date to sync up medications Metoprolol tartrate 50mg  03/2023 30DS Montelukast 10mg  03/29/23 90DS Pantoprazole 40mg  01/20/23 90DS Rybelsus 7mg  03/23/23 30DS Venlafaxine ER 150mg  02/28/23 90DS  Albuterol HFA- as needed; 09/05/22 25DS Alprazolam 0.5mg - as needed; 03/14/23 30DS   Objective:  Lab Results  Component Value Date   HGBA1C 9.2 (H) 03/26/2023    Lab Results  Component Value Date   CREATININE 0.68 03/26/2023   BUN 11 03/26/2023   NA 137 03/26/2023   K 4.2 03/26/2023   CL 101 03/26/2023   CO2 18 (L) 03/26/2023    Lab Results  Component Value Date   CHOL 163 03/26/2023   HDL 24 (L) 03/26/2023   LDLCALC 83 03/26/2023   TRIG 340 (H) 03/26/2023   CHOLHDL 6.8 (H) 03/26/2023    Medications Reviewed Today   Medications were not reviewed in this encounter       Assessment/Plan:   Medication Management: - Currently strategy insufficient to maintain appropriate adherence to prescribed medication regimen - Suggested use of weekly pill box to organize medications - Discussed collaboration with local pharmacies for adherence packaging. Reviewed local pharmacies with adherence packaging options. [facility]    Follow Up Plan:  - Follow-up on 05/16/23 to review decision about switching medications to [facility] compliance packaging + medication pill box filling while on the phone to ensure filling properly - Reports dizziness  lately, so she has to get husband to drive her everywhere- would prefer phone call to fill pill box than rather go into the office to have me fill it for her  - With upcoming ablation and PCP appointment 1 week after, would prefer to discuss  decision with husband and review medications at a later time - Will submit Eliquis script to [facility] pharmacy to see cost prior to next call   New insurance card information for HTA: ID [id number] BIN: [id number] PCN: [id number] GRP: [id number]  Marlowe Aschoff, PharmD [facility] Phone Number: [phone]

## 2023-04-12 NOTE — Telephone Encounter (Signed)
New Message:  Patient would like for Carlyle to give her a call. She says it is concerning a test she needs. She could not think of the name of the test.

## 2023-04-18 ENCOUNTER — Other Ambulatory Visit: Payer: Self-pay | Admitting: Nurse Practitioner

## 2023-04-18 ENCOUNTER — Ambulatory Visit
Admission: RE | Admit: 2023-04-18 | Discharge: 2023-04-18 | Disposition: A | Discharge status: Home / Self Care | Payer: PPO | Source: Ambulatory Visit | Attending: Cardiology | Admitting: Cardiology

## 2023-04-18 DIAGNOSIS — I480 Paroxysmal atrial fibrillation: Secondary | ICD-10-CM | POA: Insufficient documentation

## 2023-04-18 DIAGNOSIS — I10 Essential (primary) hypertension: Secondary | ICD-10-CM | POA: Diagnosis not present

## 2023-04-18 DIAGNOSIS — K219 Gastro-esophageal reflux disease without esophagitis: Secondary | ICD-10-CM

## 2023-04-18 MED ORDER — IOHEXOL 350 MG/ML SOLN
100.0000 mL | Freq: Once | INTRAVENOUS | Status: AC | PRN
  Administered 2023-04-18: 100 mL via INTRAVENOUS

## 2023-04-18 MED ORDER — SODIUM CHLORIDE 0.9 % IV SOLN: INTRAVENOUS | Status: DC

## 2023-04-18 NOTE — Telephone Encounter (Signed)
 Lisa Gutierrez.  You are listed as this patient's PCP -[facility]

## 2023-04-21 NOTE — Pre-Procedure Instructions (Signed)
 Attempted to call patient regarding procedure with anesthesia on 1/16.  Left voicemail to hold the Rybellsus for 24 hours before procedure.  Last dose will be 04/30/23, none on 1/15, procedure on 1/16, so none that morning.

## 2023-04-22 ENCOUNTER — Other Ambulatory Visit (HOSPITAL_COMMUNITY): Payer: Self-pay

## 2023-04-22 MED ORDER — APIXABAN 5 MG PO TABS
5.0000 mg | ORAL_TABLET | Freq: Two times a day (BID) | ORAL | 0 refills | Status: DC
  Filled 2023-04-22: qty 60, 30d supply, fill #0

## 2023-04-25 ENCOUNTER — Ambulatory Visit: Payer: PPO | Attending: Cardiology | Admitting: Cardiology

## 2023-04-25 ENCOUNTER — Encounter: Payer: Self-pay | Admitting: Cardiology

## 2023-04-25 VITALS — BP 144/81 | HR 127 | Ht 65.0 in | Wt 213.2 lb

## 2023-04-25 DIAGNOSIS — I480 Paroxysmal atrial fibrillation: Secondary | ICD-10-CM | POA: Diagnosis not present

## 2023-04-25 DIAGNOSIS — I10 Essential (primary) hypertension: Secondary | ICD-10-CM | POA: Diagnosis not present

## 2023-04-25 DIAGNOSIS — F411 Generalized anxiety disorder: Secondary | ICD-10-CM

## 2023-04-25 DIAGNOSIS — E1165 Type 2 diabetes mellitus with hyperglycemia: Secondary | ICD-10-CM

## 2023-04-25 DIAGNOSIS — E669 Obesity, unspecified: Secondary | ICD-10-CM | POA: Diagnosis not present

## 2023-04-25 NOTE — Progress Notes (Signed)
 Cardiology Office Note:  .   Date:  04/26/2023  ID:  [name], DOB 30-Sep-1950, MRN [id number] PCP: Wallace Joesph LABOR, PA  [facility] Providers Cardiologist:  Deatrice Cage, MD Electrophysiologist:  [name], MD    History of Present Illness: .   Lisa Gutierrez is a 73 y.o. female with a past medical history of essential hypertension, chronic electrolyte abnormalities, GERD, and IBS with constipation, chronic superficial gastritis without bleeding, fatty liver disease, type 2 diabetes, GAD, insomnia, chronic history of statin intolerance, morbid obesity, persistent atrial fibrillation, who is here today for follow-up.   She had previously presented for cataract surgery on 07/10/2022 was found to be in atrial fibrillation with RVR by anesthesia.  Heart rate was 1 70-1 9 past time and procedure was subsequently canceled.  Echocardiogram revealed an LVEF of 50-55%, no RWMA, no valvular abnormalities were noted.  She was started on metoprolol , dig, and apixaban  and subsequently discharged on 07/04/2022.  She followed up in clinic on July 16, 2022 where she been tolerating her apixaban  and beta-blockers without incident she was scheduled for cardioversion procedure after being on anticoagulant with 4 weeks uninterrupted.  She did previously undergone cardioversion that was successful for generalized vision she was noted to be back in atrial fibrillation.   She was last seen in clinic 11/30/2022 she continued to be tearful with complaints of being depressed, weak, fatigued, anxious, with multiple questions about her medications.  She was continued on apixaban  for CHA2DS2-VASc of at least 5 for stroke prophylaxis.  EKG revealed she was still in atrial fibrillation.  She was apprehensive about repeat cardioversion she was referred to EP.   She was last seen in clinic by EP 02/27/23.  Discussed treatment options for atrial fibrillation including antiarrhythmic drug therapy  and ablation.  After discussing risks, recovery and likelihood of success with the treatment strategy she had opted for ablation procedure.  This was to be scheduled after testing was completed.  Ablation is tentatively scheduled for 05/02/2023.  She returns to clinic today stating that she has been short of breath, fatigued, and extremely anxious.  She has her ablation scheduled next week in [place].  Unfortunately prior to arrival today she has not taken any of her medications with the exception of her apixaban .  She has been compliant with her apixaban  stating that she has not missed any doses.  Denies any bleeding with no blood noted in her stool or urine.  She states that she has noticed an increased heart rate today with fatigue and shortness of breath but she has no signs not had any of her medications.  She denies any chest pain, weight gain, early satiety, or swelling.  Denies any hospitalizations or visits to the emergency department  ROS: 10 point review of systems has been reviewed and considered negative with exception what is been listed in the HPI  Studies Reviewed: [name]   EKG Interpretation Date/Time:  Thursday April 25 2023 10:57:27 EST Ventricular Rate:  127 PR Interval:    QRS Duration:  80 QT Interval:  322 QTC Calculation: 467 R Axis:   -57  Text Interpretation: Atrial fibrillation with rapid ventricular response with premature ventricular or aberrantly conducted complexes Left axis deviation Pulmonary disease pattern When compared with ECG of 22-Jan-2023 11:49, No significant change was found Confirmed by Gerard Frederick ([id number]) on 04/25/2023 10:59:36 AM   cCTA 04/18/2023  IMPRESSION: 1. There is normal pulmonary vein drainage into the left atrium. (2  on the right and 2 on the left) with ostial measurements as above.   2. The left atrial appendage is a Windsock-cactus type with ostial size 29 x 23 mm and length 31 mm, Area 49 mm2. There is no thrombus in the left atrial  appendage.   3. The esophagus runs in the left atrial midline and is not in the proximity to any of the pulmonary veins.   4. Coronary calcium  score of 331. This was 85th percentile for age/gender.  DCCV 09/03/22 The patient had the defibrillator pads placed in the anterior and posterior position. Once an appropriate level of sedation was confirmed, the patient was cardioverted x 2 with 200J of biphasic synchronized energy.  The patient converted to NSR.  There were no apparent complications.  The patient had normal neuro status and respiratory status post procedure with vitals stable as recorded elsewhere.  Adequate airway was maintained throughout and vital signs monitored per protocol.    TTE 07/10/22 1. Left ventricular ejection fraction, by estimation, is 50 to 55%. The  left ventricle has low normal function. The left ventricle has no regional  wall motion abnormalities. There is mild left ventricular hypertrophy.  Left ventricular diastolic  parameters are indeterminate.   2. Right ventricular systolic function is normal. The right ventricular  size is normal. Tricuspid regurgitation signal is inadequate for assessing  PA pressure.   3. Left atrial size was moderately dilated.   4. The mitral valve is normal in structure. No evidence of mitral valve  regurgitation. No evidence of mitral stenosis.   5. The aortic valve is normal in structure. Aortic valve regurgitation is  not visualized. No aortic stenosis is present.  Risk Assessment/Calculations:    CHA2DS2-VASc Score = 4   This indicates a 4.8% annual risk of stroke. The patient's score is based upon: CHF History: 0 HTN History: 1 Diabetes History: 1 Stroke History: 0 Vascular Disease History: 0 Age Score: 1 Gender Score: 1    HYPERTENSION CONTROL Vitals:   04/25/23 1045 04/25/23 1100  BP: (!) 150/126 (!) 144/81    The patient's blood pressure is elevated above target today.  In order to address the patient's  elevated BP: Blood pressure will be monitored at home to determine if medication changes need to be made. (Patient did not had any medications prior to appointment)          Physical Exam:   VS:  BP (!) 144/81 (BP Location: Left Arm, Patient Position: Supine, Cuff Size: Normal)   Pulse (!) 127   Ht 5' 5 (1.651 m)   Wt 213 lb 3.2 oz (96.7 kg)   SpO2 97%   BMI 35.48 kg/m    Wt Readings from Last 3 Encounters:  04/25/23 213 lb 3.2 oz (96.7 kg)  02/27/23 222 lb (100.7 kg)  01/22/23 223 lb 12.8 oz (101.5 kg)    GEN: Well nourished, well developed in no acute distress NECK: No JVD; No carotid bruits CARDIAC: IR IR, no murmurs, rubs, gallops RESPIRATORY:  Clear to auscultation without rales, wheezing or rhonchi  ABDOMEN: Soft, non-tender, non-distended EXTREMITIES: Trace pretibial edema; No deformity   ASSESSMENT AND PLAN: .   Paroxysmal atrial fibrillation status post DCCV.  EKG today reveals the patient is still in atrial fibrillation but rate is slightly higher than previous.  Unfortunately she has not had any of her medications today.  She has been continued on apixaban  5 mg twice daily for CHA2DS2-VASc of at least 5 for stroke  prophylaxis.  She is also continued on dig and metoprolol  tartrate.  She continues to have complaints of fatigue, anxiety, shortness of breath, dizziness.  She is scheduled for atrial fibrillation ablation upcoming [name] for next Thursday 05/02/2023.  Primary hypertension with blood pressure today of 144/81.  Unfortunately she has not had any of her medications today.  She has been continued on her current medication regimen without changes.  She is also been advised to continue to monitor blood pressures 1 to 2 hours postmedication administration at home as well.  Type 2 diabetes where she was recently had medication changes made.  This continues to be managed by PCP.  Obesity with a BMI 35.48.  She is encouraged to continue with increasing her activity and  making dietary changes.  With her depression anxiety is difficult for her to get more active which complicates her overall prognoses.  GAD which she is on several medications have been titrated by psychiatry.  She continues to be followed by psychiatry and is encouraged to maintain all follow-ups.       Dispo: Return to clinic to see MD/APP in 3 months or sooner if needed for reevaluation of symptoms.  Signed, Elaysha Bevard, NP

## 2023-04-25 NOTE — Patient Instructions (Signed)
Medication Instructions:  No changes at this time.   *If you need a refill on your cardiac medications before your next appointment, please call your pharmacy*   Lab Work: None  If you have labs (blood work) drawn today and your tests are completely normal, you will receive your results only by: MyChart Message (if you have MyChart) OR A paper copy in the mail If you have any lab test that is abnormal or we need to change your treatment, we will call you to review the results.   Testing/Procedures: None   Follow-Up: At [facility], you and your health needs are our priority.  As part of our continuing mission to provide you with exceptional heart care, we have created designated Provider Care Teams.  These Care Teams include your primary Cardiologist (physician) and Advanced Practice Providers (APPs -  Physician Assistants and Nurse Practitioners) who all work together to provide you with the care you need, when you need it.   Your next appointment:   3 month(s)  Provider:   Muhammad Arida, MD or Sheri Hammock, NP    

## 2023-04-30 NOTE — Pre-Procedure Instructions (Signed)
 Instructed patient on the following items: Arrival time 0515 on Thursday 1/16 Nothing to eat or drink after midnight No meds AM of procedure Responsible person to drive you home and stay with you for 24 hrs  Have you missed any doses of anti-coagulant Eliquis - takes twice a day, hasn't missed any doses.  Don't take dose on Thursday.

## 2023-05-02 ENCOUNTER — Encounter (HOSPITAL_COMMUNITY): Payer: Self-pay | Admitting: Cardiology

## 2023-05-02 ENCOUNTER — Other Ambulatory Visit: Payer: Self-pay

## 2023-05-02 ENCOUNTER — Encounter (HOSPITAL_COMMUNITY)
Admission: RE | Disposition: A | Discharge status: Home / Self Care | Payer: Self-pay | Source: Home / Self Care | Attending: Cardiology

## 2023-05-02 ENCOUNTER — Ambulatory Visit (HOSPITAL_COMMUNITY): Payer: PPO

## 2023-05-02 ENCOUNTER — Other Ambulatory Visit (HOSPITAL_COMMUNITY): Payer: Self-pay

## 2023-05-02 ENCOUNTER — Ambulatory Visit (HOSPITAL_COMMUNITY)
Admission: RE | Admit: 2023-05-02 | Discharge: 2023-05-02 | Disposition: A | Discharge status: Home / Self Care | Payer: PPO | Attending: Cardiology | Admitting: Cardiology

## 2023-05-02 ENCOUNTER — Ambulatory Visit (HOSPITAL_BASED_OUTPATIENT_CLINIC_OR_DEPARTMENT_OTHER): Payer: PPO

## 2023-05-02 DIAGNOSIS — I10 Essential (primary) hypertension: Secondary | ICD-10-CM | POA: Diagnosis not present

## 2023-05-02 DIAGNOSIS — Z7984 Long term (current) use of oral hypoglycemic drugs: Secondary | ICD-10-CM | POA: Insufficient documentation

## 2023-05-02 DIAGNOSIS — E119 Type 2 diabetes mellitus without complications: Secondary | ICD-10-CM

## 2023-05-02 DIAGNOSIS — Z87891 Personal history of nicotine dependence: Secondary | ICD-10-CM | POA: Insufficient documentation

## 2023-05-02 DIAGNOSIS — I4819 Other persistent atrial fibrillation: Secondary | ICD-10-CM | POA: Diagnosis not present

## 2023-05-02 DIAGNOSIS — F418 Other specified anxiety disorders: Secondary | ICD-10-CM | POA: Diagnosis not present

## 2023-05-02 DIAGNOSIS — I4891 Unspecified atrial fibrillation: Secondary | ICD-10-CM

## 2023-05-02 HISTORY — PX: ATRIAL FIBRILLATION ABLATION: EP1191

## 2023-05-02 LAB — CBC
HCT: 44 % (ref 36.0–46.0)
Hemoglobin: 14.3 g/dL (ref 12.0–15.0)
MCH: 29.8 pg (ref 26.0–34.0)
MCHC: 32.5 g/dL (ref 30.0–36.0)
MCV: 91.7 fL (ref 80.0–100.0)
Platelets: 229 10*3/uL (ref 150–400)
RBC: 4.8 MIL/uL (ref 3.87–5.11)
RDW: 13.7 % (ref 11.5–15.5)
WBC: 9.4 10*3/uL (ref 4.0–10.5)
nRBC: 0 % (ref 0.0–0.2)

## 2023-05-02 LAB — BASIC METABOLIC PANEL
Anion gap: 12 (ref 5–15)
BUN: 12 mg/dL (ref 8–23)
CO2: 22 mmol/L (ref 22–32)
Calcium: 9.6 mg/dL (ref 8.9–10.3)
Chloride: 103 mmol/L (ref 98–111)
Creatinine, Ser: 0.81 mg/dL (ref 0.44–1.00)
GFR, Estimated: >60 mL/min (ref >60)
Glucose, Bld: 230 mg/dL — ABNORMAL HIGH (ref 70–99)
Potassium: 4.2 mmol/L (ref 3.5–5.1)
Sodium: 137 mmol/L (ref 135–145)

## 2023-05-02 LAB — GLUCOSE, CAPILLARY
Glucose-Capillary: 214 mg/dL — ABNORMAL HIGH (ref 70–99)
Glucose-Capillary: 256 mg/dL — ABNORMAL HIGH (ref 70–99)

## 2023-05-02 LAB — POCT ACTIVATED CLOTTING TIME: Activated Clotting Time: 262 s

## 2023-05-02 SURGERY — ATRIAL FIBRILLATION ABLATION
Anesthesia: General

## 2023-05-02 MED ORDER — HEPARIN SODIUM (PORCINE) 1000 UNIT/ML IJ SOLN
INTRAMUSCULAR | Status: DC | PRN
  Administered 2023-05-02: 6000 [IU] via INTRAVENOUS
  Administered 2023-05-02: 14000 [IU] via INTRAVENOUS

## 2023-05-02 MED ORDER — DEXAMETHASONE SODIUM PHOSPHATE 10 MG/ML IJ SOLN
INTRAMUSCULAR | Status: DC | PRN
  Administered 2023-05-02: 10 mg via INTRAVENOUS

## 2023-05-02 MED ORDER — COLCHICINE 0.6 MG PO TABS
0.6000 mg | ORAL_TABLET | Freq: Two times a day (BID) | ORAL | Status: DC
  Administered 2023-05-02: 0.6 mg via ORAL
  Filled 2023-05-02 (×2): qty 1

## 2023-05-02 MED ORDER — COLCHICINE 0.6 MG PO TABS
0.6000 mg | ORAL_TABLET | Freq: Two times a day (BID) | ORAL | 0 refills | Status: DC
  Filled 2023-05-02: qty 10, 5d supply, fill #0

## 2023-05-02 MED ORDER — ATROPINE SULFATE 1 MG/10ML IJ SOSY
PREFILLED_SYRINGE | INTRAMUSCULAR | Status: DC | PRN
  Administered 2023-05-02: 1 mg via INTRAVENOUS

## 2023-05-02 MED ORDER — ONDANSETRON HCL 4 MG/2ML IJ SOLN: 4.0000 mg | Freq: Four times a day (QID) | INTRAMUSCULAR | Status: DC | PRN

## 2023-05-02 MED ORDER — PANTOPRAZOLE SODIUM 40 MG PO TBEC
40.0000 mg | DELAYED_RELEASE_TABLET | Freq: Two times a day (BID) | ORAL | Status: DC
  Administered 2023-05-02: 40 mg via ORAL
  Filled 2023-05-02 (×2): qty 1

## 2023-05-02 MED ORDER — SODIUM CHLORIDE 0.9 % IV SOLN: 250.0000 mL | INTRAVENOUS | Status: DC | PRN

## 2023-05-02 MED ORDER — SODIUM CHLORIDE 0.9 % IV SOLN: INTRAVENOUS | Status: DC

## 2023-05-02 MED ORDER — FENTANYL CITRATE (PF) 250 MCG/5ML IJ SOLN
INTRAMUSCULAR | Status: DC | PRN
  Administered 2023-05-02 (×2): 50 ug via INTRAVENOUS

## 2023-05-02 MED ORDER — PROPOFOL 500 MG/50ML IV EMUL
INTRAVENOUS | Status: DC | PRN
  Administered 2023-05-02: 55 ug/kg/min via INTRAVENOUS

## 2023-05-02 MED ORDER — DEXMEDETOMIDINE HCL IN NACL 80 MCG/20ML IV SOLN
INTRAVENOUS | Status: DC | PRN
  Administered 2023-05-02: 12 ug via INTRAVENOUS

## 2023-05-02 MED ORDER — HEPARIN (PORCINE) IN NACL 1000-0.9 UT/500ML-% IV SOLN
INTRAVENOUS | Status: DC | PRN
  Administered 2023-05-02 (×3): 500 mL

## 2023-05-02 MED ORDER — PROTAMINE SULFATE 10 MG/ML IV SOLN
INTRAVENOUS | Status: DC | PRN
  Administered 2023-05-02: 30 mg via INTRAVENOUS

## 2023-05-02 MED ORDER — ROCURONIUM BROMIDE 10 MG/ML (PF) SYRINGE
PREFILLED_SYRINGE | INTRAVENOUS | Status: DC | PRN
  Administered 2023-05-02 (×2): 50 mg via INTRAVENOUS
  Administered 2023-05-02: 20 mg via INTRAVENOUS

## 2023-05-02 MED ORDER — PHENYLEPHRINE 80 MCG/ML (10ML) SYRINGE FOR IV PUSH (FOR BLOOD PRESSURE SUPPORT)
PREFILLED_SYRINGE | INTRAVENOUS | Status: DC | PRN
  Administered 2023-05-02 (×3): 80 ug via INTRAVENOUS
  Administered 2023-05-02: 160 ug via INTRAVENOUS
  Administered 2023-05-02: 80 ug via INTRAVENOUS

## 2023-05-02 MED ORDER — ONDANSETRON HCL 4 MG/2ML IJ SOLN
INTRAMUSCULAR | Status: DC | PRN
  Administered 2023-05-02 (×2): 4 mg via INTRAVENOUS

## 2023-05-02 MED ORDER — ACETAMINOPHEN 325 MG PO TABS
650.0000 mg | ORAL_TABLET | ORAL | Status: DC | PRN
  Administered 2023-05-02: 650 mg via ORAL
  Filled 2023-05-02: qty 2

## 2023-05-02 MED ORDER — SUGAMMADEX SODIUM 200 MG/2ML IV SOLN
INTRAVENOUS | Status: DC | PRN
  Administered 2023-05-02: 200 mg via INTRAVENOUS

## 2023-05-02 MED ORDER — APIXABAN 5 MG PO TABS
5.0000 mg | ORAL_TABLET | Freq: Two times a day (BID) | ORAL | Status: DC
  Administered 2023-05-02: 5 mg via ORAL
  Filled 2023-05-02: qty 1

## 2023-05-02 MED ORDER — PROPOFOL 10 MG/ML IV BOLUS
INTRAVENOUS | Status: DC | PRN
  Administered 2023-05-02: 100 mg via INTRAVENOUS
  Administered 2023-05-02: 20 mg via INTRAVENOUS

## 2023-05-02 MED ORDER — SODIUM CHLORIDE 0.9% FLUSH: 3.0000 mL | INTRAVENOUS | Status: DC | PRN

## 2023-05-02 MED ORDER — LIDOCAINE 2% (20 MG/ML) 5 ML SYRINGE
INTRAMUSCULAR | Status: DC | PRN
  Administered 2023-05-02: 60 mg via INTRAVENOUS

## 2023-05-02 MED ORDER — SODIUM CHLORIDE 0.9% FLUSH: 3.0000 mL | Freq: Two times a day (BID) | INTRAVENOUS | Status: DC

## 2023-05-02 SURGICAL SUPPLY — 20 items
CABLE PFA RX CATH CONN (CABLE) IMPLANT
CATH FARAWAVE ABLATION 31 (CATHETERS) IMPLANT
CATH OCTARAY 2.0 F 3-3-3-3-3 (CATHETERS) IMPLANT
CATH SOUNDSTAR ECO 8FR (CATHETERS) IMPLANT
CATH WEBSTER BI DIR CS D-F CRV (CATHETERS) IMPLANT
CLOSURE PERCLOSE PROSTYLE (VASCULAR PRODUCTS) IMPLANT
COVER DOME SNAP 22 D (MISCELLANEOUS) IMPLANT
COVER SWIFTLINK CONNECTOR (BAG) ×1 IMPLANT
DILATOR VESSEL 38 20CM 16FR (INTRODUCER) IMPLANT
GUIDEWIRE INQWIRE 1.5J.035X260 (WIRE) IMPLANT
INQWIRE 1.5J .035X260CM (WIRE) ×1
KIT VERSACROSS CNCT FARADRIVE (KITS) IMPLANT
MAT PREVALON FULL STRYKER (MISCELLANEOUS) IMPLANT
PACK EP LF (CUSTOM PROCEDURE TRAY) ×1 IMPLANT
PAD DEFIB RADIO PHYSIO CONN (PAD) ×1 IMPLANT
PATCH CARTO3 (PAD) IMPLANT
SHEATH FARADRIVE STEERABLE (SHEATH) IMPLANT
SHEATH PINNACLE 8F 10CM (SHEATH) IMPLANT
SHEATH PINNACLE 9F 10CM (SHEATH) IMPLANT
SHEATH PROBE COVER 6X72 (BAG) IMPLANT

## 2023-05-02 NOTE — H&P (Signed)
Electrophysiology Office Note:     Date:  05/02/2023    ID:  Lisa Gutierrez, DOB June 02, 1950, MRN [id number]   [facility] Cardiologist:  Lorine Bears, MD  [facility] Electrophysiologist:  Lanier Prude, MD    Referring MD: Charlsie Quest, NP    Chief Complaint: AF   History of Present Illness:         Discussed the use of AI scribe software for clinical note transcription with the patient, who gave verbal consent to proceed.   History of Present Illness   Lisa Gutierrez, a 73 year old woman with a history of hypertension, GERD, IBS, gastritis, fatty liver disease, diabetes, anxiety, insomnia, and morbid obesity, presents for evaluation of atrial fibrillation. She was diagnosed with atrial fibrillation when she presented for cataract surgery, which was subsequently cancelled. She is currently on Eliquis for stroke prophylaxis, digoxin, and metoprolol. She reports shortness of breath, dizziness, and increased anxiety when in atrial fibrillation. She also reports fatigue and decreased activity tolerance, stating that she "doesn't want to do anything" and "doesn't have the strength." She describes being wiped out after doing simple tasks like washing clothes and has difficulty walking. She had a cardioversion in the past, which was successful for about six weeks before her atrial fibrillation returned. She is apprehensive about repeat cardioversion.            Presents for AF ablation today. Procedure reviewed. She has not missed any anticoagulation.        Objective Their past medical, social and family history was reveiwed.     ROS:   Please see the history of present illness.    All other systems reviewed and are negative.   EKGs/Labs/Other Studies Reviewed:     The following studies were reviewed today:   09/03/2022 ECG shows sinus with low amplitude p waves/ qrs complexes   07/10/2022 Echo - EF 50              Physical Exam:     VS:  BP 133/90 (BP  Location: Left Arm, Patient Position: Sitting, Cuff Size: Large)   Pulse 92   Ht 5\' 6"  (1.676 m)   Wt 222 lb (100.7 kg)   SpO2 98%   BMI 35.83 kg/m         Wt Readings from Last 3 Encounters:  02/27/23 222 lb (100.7 kg)  01/22/23 223 lb 12.8 oz (101.5 kg)  01/01/23 228 lb 9.6 oz (103.7 kg)      Physical Exam   GENERAL: Obese female, no distress, no increased work of breathing. CARDIOVASCULAR: Regular rate and rhythm. EXTREMITIES: Warm extremities.           Assessment ASSESSMENT AND PLAN:     1. Paroxysmal atrial fibrillation (HCC)   2. Essential hypertension           Assessment and Plan    Atrial Fibrillation Symptomatic with fatigue and decreased exercise tolerance. Previously attempted cardioversion with recurrence of Afib. Patient is aware of when she is in Afib. Discussed the benefits of rhythm control and the higher success rate of ablation compared to medications. -Plan for outpatient atrial fibrillation ablation procedure. -Continue Eliquis for stroke prophylaxis.         Discussed treatment options today for AF including antiarrhythmic drug therapy and ablation. Discussed risks, recovery and likelihood of success with each treatment strategy. Risk, benefits, and alternatives to EP study and ablation for afib were discussed. These risks include but are not limited to stroke,  bleeding, vascular damage, tamponade, perforation, damage to the esophagus, lungs, phrenic nerve and other structures, pulmonary vein stenosis, worsening renal function, coronary vasospasm and death.  Discussed potential need for repeat ablation procedures and antiarrhythmic drugs after an initial ablation. The patient understands these risk and wishes to proceed.  We will therefore proceed with catheter ablation at the next available time.  Carto, ICE, anesthesia are requested for the procedure.  Will also obtain CT PV protocol prior to the procedure to exclude LAA thrombus and further evaluate  atrial anatomy.      Presents for AF ablation today. Procedure reviewed.             Signed, Rossie Muskrat. Lalla Brothers, MD, [facility], [facility] 05/02/2023 Electrophysiology [facility]

## 2023-05-02 NOTE — Anesthesia Procedure Notes (Signed)
Procedure Name: Intubation Date/Time: 05/02/2023 8:10 AM  Performed by: Vena Austria, CRNAPre-anesthesia Checklist: Patient identified, Suction available, Patient being monitored, Timeout performed and Emergency Drugs available Oxygen Delivery Method: Circle system utilized Preoxygenation: Pre-oxygenation with 100% oxygen Induction Type: IV induction Ventilation: Mask ventilation without difficulty Laryngoscope Size: Glidescope and 3 Grade View: Grade I Tube size: 7.0 mm Airway Equipment and Method: Stylet and Video-laryngoscopy Placement Confirmation: positive ETCO2, breath sounds checked- equal and bilateral and ETT inserted through vocal cords under direct vision Secured at: 22 cm Tube secured with: Tape Dental Injury: Teeth and Oropharynx as per pre-operative assessment

## 2023-05-02 NOTE — Transfer of Care (Signed)
Immediate Anesthesia Transfer of Care Note  Patient: Lisa Gutierrez  Procedure(s) Performed: ATRIAL FIBRILLATION ABLATION  Patient Location: PACU and Cath Lab  Anesthesia Type:General  Level of Consciousness: awake  Airway & Oxygen Therapy: Patient connected to nasal cannula oxygen  Post-op Assessment: Post -op Vital signs reviewed and stable  Post vital signs: stable  Last Vitals:  Vitals Value Taken Time  BP    Temp    Pulse 80 05/02/23 0950  Resp 23 05/02/23 0950  SpO2 94 % 05/02/23 0950  Vitals shown include unfiled device data.  Last Pain:  Vitals:   05/02/23 1610  TempSrc:   PainSc: 0-No pain      Patients Stated Pain Goal: 3 (05/02/23 9604)  Complications: No notable events documented.

## 2023-05-02 NOTE — Discharge Instructions (Addendum)
Cardiac Ablation, Care After  This sheet gives you information about how to care for yourself after your procedure. Your health care provider may also give you more specific instructions. If you have problems or questions, contact your health care provider. What can I expect after the procedure? After the procedure, it is common to have: Bruising around your puncture site. Tenderness around your puncture site. Skipped heartbeats. If you had an atrial fibrillation ablation, you may have atrial fibrillation during the first several months after your procedure.  Tiredness (fatigue).  Follow these instructions at home: Puncture site care  Follow instructions from your health care provider about how to take care of your puncture site. Make sure you: Remove square bandages tomorrow at 12:00 noon. Check your puncture site every day for signs of infection. Check for: Redness, swelling, or pain. Fluid or blood. If your puncture site starts to bleed, lie down on your back, apply firm pressure to the area, and contact your health care provider. Warmth. Pus or a bad smell. A pea or marble sized lump/knot at the site is normal and can take up to three months to resolve.  Driving Do not drive for at least 4 days after your procedure or however long your health care provider recommends. (Do not resume driving if you have previously been instructed not to drive for other health reasons.) Do not drive or use heavy machinery while taking prescription pain medicine. Activity Avoid activities that take a lot of effort for at least 7 days after your procedure. Do not lift anything that is heavier than 5 lb (4.5 kg) for one week.  No sexual activity for 1 week.  Return to your normal activities as told by your health care provider. Ask your health care provider what activities are safe for you. General instructions Take over-the-counter and prescription medicines only as told by your health care provider. Do  not use any products that contain nicotine or tobacco, such as cigarettes and e-cigarettes. If you need help quitting, ask your health care provider. You may shower after removal of dressing, but Do not take baths, swim, or use a hot tub for 1 week.  Do not drink alcohol for 24 hours after your procedure. Keep all follow-up visits as told by your health care provider. This is important. Contact a health care provider if: You have redness, mild swelling, or pain around your puncture site. You have fluid or blood coming from your puncture site that stops after applying firm pressure to the area. Your puncture site feels warm to the touch. You have pus or a bad smell coming from your puncture site. You have a fever. You have chest pain or discomfort that spreads to your neck, jaw, or arm. You have chest pain that is worse with lying on your back or taking a deep breath. You are sweating a lot. You feel nauseous. You have a fast or irregular heartbeat. You have shortness of breath. You are dizzy or light-headed and feel the need to lie down. You have pain or numbness in the arm or leg closest to your puncture site. Get help right away if: Your puncture site suddenly swells. Lie flat, apply pressure for 15 minutes on the swelling. CALL 911 Your puncture site is bleeding and the bleeding does not stop after applying firm pressure  for 15 minutes. CALL 911 These symptoms may represent a serious problem that is an emergency. Do not wait to see if the symptoms will go away. Get  medical help right away. Call your local emergency services (911 in the [place] Do not drive yourself to the hospital. Summary After the procedure, it is normal to have bruising and tenderness at the puncture site in your groin, neck, or forearm. Check your puncture site every day for signs of infection. Get help right away if your puncture site is bleeding and the bleeding does not stop after applying firm pressure to the  area. This is a medical emergency. This information is not intended to replace advice given to you by your health care provider. Make sure you discuss any questions you have with your health care provider.

## 2023-05-02 NOTE — Anesthesia Postprocedure Evaluation (Signed)
Anesthesia Post Note  Patient: Lisa Gutierrez  Procedure(s) Performed: ATRIAL FIBRILLATION ABLATION     Patient location during evaluation: PACU Anesthesia Type: General Level of consciousness: awake and alert Pain management: pain level controlled Vital Signs Assessment: post-procedure vital signs reviewed and stable Respiratory status: spontaneous breathing, nonlabored ventilation, respiratory function stable and patient connected to nasal cannula oxygen Cardiovascular status: blood pressure returned to baseline and stable Postop Assessment: no apparent nausea or vomiting Anesthetic complications: no  No notable events documented.  Last Vitals:  Vitals:   05/02/23 1200 05/02/23 1230  BP: 119/60 (!) 128/58  Pulse: 85 94  Resp: (!) 25 (!) 0  Temp:    SpO2: 93% 93%    Last Pain:  Vitals:   05/02/23 1145  TempSrc:   PainSc: 3                  Shelton Silvas

## 2023-05-02 NOTE — Anesthesia Preprocedure Evaluation (Addendum)
Anesthesia Evaluation  Patient identified by MRN, date of birth, ID band Patient awake    Reviewed: Allergy & Precautions, NPO status , Patient's Chart, lab work & pertinent test results  Airway Mallampati: II  TM Distance: >3 FB Neck ROM: Full    Dental  (+) Teeth Intact, Dental Advisory Given   Pulmonary former smoker   breath sounds clear to auscultation       Cardiovascular hypertension, Pt. on medications and Pt. on home beta blockers  Rhythm:Irregular Rate:Normal     Neuro/Psych  PSYCHIATRIC DISORDERS Anxiety Depression       GI/Hepatic Neg liver ROS,GERD  ,,  Endo/Other  diabetes, Type 2, Oral Hypoglycemic Agents    Renal/GU negative Renal ROS     Musculoskeletal negative musculoskeletal ROS (+)    Abdominal   Peds  Hematology negative hematology ROS (+)   Anesthesia Other Findings   Reproductive/Obstetrics                             Anesthesia Physical Anesthesia Plan  ASA: 3  Anesthesia Plan: General   Post-op Pain Management:    Induction: Intravenous  PONV Risk Score and Plan: 4 or greater and Ondansetron, Dexamethasone and Treatment may vary due to age or medical condition  Airway Management Planned: Oral ETT  Additional Equipment: None  Intra-op Plan:   Post-operative Plan: Extubation in OR  Informed Consent: I have reviewed the patients History and Physical, chart, labs and discussed the procedure including the risks, benefits and alternatives for the proposed anesthesia with the patient or authorized representative who has indicated his/her understanding and acceptance.     Dental advisory given  Plan Discussed with: CRNA  Anesthesia Plan Comments:        Anesthesia Quick Evaluation

## 2023-05-03 ENCOUNTER — Telehealth: Payer: Self-pay

## 2023-05-03 NOTE — Telephone Encounter (Signed)
Patient doesn't want an appt just looking to see what she can take for the pain.

## 2023-05-03 NOTE — Telephone Encounter (Signed)
I called patient to discuss leg pain and constipation. She confirms that bilateral groins are soft without swelling or hard knot under the skin. I recommended BID tylenol for the next couple days as she heals.

## 2023-05-03 NOTE — Telephone Encounter (Signed)
Called patient.   Reviewed Sherie Don, NP recommendations.  Patient reports she has not taken the bandage off so she is unsure of any swelling. She stated it is just very painful and the pain is doing down her leg.   Offered her an appt today with [place] and patient states "I don't really want to go anywhere I just want to stay home. It hurts to walk."  Made her aware of the recommendations for constipation.   She stated that she will take the bandage office and decide if she would like to come in office to be seen.      May 03, 2023 Me to [name]      05/03/23  1:16 PM Good Afternoon,    I sent your message to [place].    If you are having swelling at the groin site she would like to see you today.  For constipation, ok to take colace, miralax, or dulcalax PRN to help soften stool to prevent straining.    Please me know and ill add you to her schedule.    Thanks,  [place], CMA   This MyChart message has not been read. Sherie Don, NP to Me     05/03/23  1:07 PM  Is she having any swelling at the groin insertion site? If so, add her to my schedule this afternoon.  For constipation, ok to take colace, miralax, or dulcalax PRN to help soften stool to prevent straining.      05/03/23 12:48 PM You routed this conversation to Sherie Don, NP  Lisa Gutierrez to [facility] Triage (supporting Sherie Don, NP)      05/03/23 12:17 PM Lisa Gutierrez I had an ablation done yesterday they had to put pressure on a hematoma on my left leg I have been having trouble with constipation  they told me to call my doctor because they didn't want me to strain also my left leg hurts really bad is there anything more that I could take to relieve it

## 2023-05-05 ENCOUNTER — Other Ambulatory Visit: Payer: Self-pay | Admitting: Cardiology

## 2023-05-05 ENCOUNTER — Telehealth: Payer: Self-pay | Admitting: Physician Assistant

## 2023-05-05 NOTE — Telephone Encounter (Signed)
Patient called because she had an atrial fibrillation ablation on 1/16.  She has developed problems with feeling short of breath and wheezing.  She had similar symptoms after her last cardioversion, with a sore throat and the same breathing difficulties.  She wonders if this is from the procedure.  I thought the patient had not been intubated, but it turns out that she had been.  After previous procedure, she was able to improve her symptoms by using an albuterol inhaler for a brief period of time.  I encouraged her to do this, take a couple of puffs 2 or 3 times a day.  I mention that she should not overuse this because it is a stimulant.  I advised her that if the inhaler does not help, she needs to call back.  Patient is in agreement with this plan of care.   Theodore Demark, PA-C 05/05/2023 12:04 PM

## 2023-05-08 ENCOUNTER — Other Ambulatory Visit: Payer: Self-pay | Admitting: Cardiology

## 2023-05-09 ENCOUNTER — Other Ambulatory Visit: Payer: Self-pay

## 2023-05-09 ENCOUNTER — Encounter: Payer: Self-pay | Admitting: Family Medicine

## 2023-05-09 ENCOUNTER — Emergency Department: Payer: PPO

## 2023-05-09 ENCOUNTER — Observation Stay
Admission: EM | Admit: 2023-05-09 | Discharge: 2023-05-10 | Disposition: A | Discharge status: Home / Self Care | Payer: PPO | Attending: Internal Medicine | Admitting: Internal Medicine

## 2023-05-09 ENCOUNTER — Ambulatory Visit (INDEPENDENT_AMBULATORY_CARE_PROVIDER_SITE_OTHER): Payer: PPO | Admitting: Family Medicine

## 2023-05-09 VITALS — BP 154/80 | HR 73 | Ht 65.0 in | Wt 216.8 lb

## 2023-05-09 DIAGNOSIS — R051 Acute cough: Secondary | ICD-10-CM

## 2023-05-09 DIAGNOSIS — I517 Cardiomegaly: Secondary | ICD-10-CM | POA: Diagnosis not present

## 2023-05-09 DIAGNOSIS — F411 Generalized anxiety disorder: Secondary | ICD-10-CM | POA: Diagnosis present

## 2023-05-09 DIAGNOSIS — Z20822 Contact with and (suspected) exposure to covid-19: Secondary | ICD-10-CM | POA: Diagnosis not present

## 2023-05-09 DIAGNOSIS — Z7901 Long term (current) use of anticoagulants: Secondary | ICD-10-CM | POA: Insufficient documentation

## 2023-05-09 DIAGNOSIS — Z79899 Other long term (current) drug therapy: Secondary | ICD-10-CM | POA: Diagnosis not present

## 2023-05-09 DIAGNOSIS — A419 Sepsis, unspecified organism: Secondary | ICD-10-CM | POA: Diagnosis not present

## 2023-05-09 DIAGNOSIS — Z87891 Personal history of nicotine dependence: Secondary | ICD-10-CM | POA: Insufficient documentation

## 2023-05-09 DIAGNOSIS — R0602 Shortness of breath: Secondary | ICD-10-CM | POA: Diagnosis not present

## 2023-05-09 DIAGNOSIS — J189 Pneumonia, unspecified organism: Principal | ICD-10-CM

## 2023-05-09 DIAGNOSIS — E1159 Type 2 diabetes mellitus with other circulatory complications: Secondary | ICD-10-CM | POA: Diagnosis not present

## 2023-05-09 DIAGNOSIS — I152 Hypertension secondary to endocrine disorders: Secondary | ICD-10-CM | POA: Diagnosis present

## 2023-05-09 DIAGNOSIS — K219 Gastro-esophageal reflux disease without esophagitis: Secondary | ICD-10-CM | POA: Diagnosis present

## 2023-05-09 DIAGNOSIS — J9621 Acute and chronic respiratory failure with hypoxia: Secondary | ICD-10-CM | POA: Diagnosis not present

## 2023-05-09 DIAGNOSIS — E1165 Type 2 diabetes mellitus with hyperglycemia: Secondary | ICD-10-CM | POA: Diagnosis present

## 2023-05-09 DIAGNOSIS — R0902 Hypoxemia: Secondary | ICD-10-CM | POA: Diagnosis not present

## 2023-05-09 DIAGNOSIS — R0989 Other specified symptoms and signs involving the circulatory and respiratory systems: Secondary | ICD-10-CM | POA: Diagnosis not present

## 2023-05-09 DIAGNOSIS — R509 Fever, unspecified: Secondary | ICD-10-CM | POA: Diagnosis not present

## 2023-05-09 DIAGNOSIS — Z7984 Long term (current) use of oral hypoglycemic drugs: Secondary | ICD-10-CM | POA: Insufficient documentation

## 2023-05-09 DIAGNOSIS — E669 Obesity, unspecified: Secondary | ICD-10-CM | POA: Diagnosis not present

## 2023-05-09 DIAGNOSIS — R918 Other nonspecific abnormal finding of lung field: Secondary | ICD-10-CM | POA: Diagnosis not present

## 2023-05-09 DIAGNOSIS — J121 Respiratory syncytial virus pneumonia: Secondary | ICD-10-CM | POA: Insufficient documentation

## 2023-05-09 LAB — RESP PANEL BY RT-PCR (RSV, FLU A&B, COVID)  RVPGX2
Influenza A by PCR: NEGATIVE
Influenza B by PCR: NEGATIVE
Resp Syncytial Virus by PCR: POSITIVE — AB
SARS Coronavirus 2 by RT PCR: NEGATIVE

## 2023-05-09 LAB — CBC WITH DIFFERENTIAL/PLATELET
Abs Immature Granulocytes: 0.11 10*3/uL — ABNORMAL HIGH (ref 0.00–0.07)
Basophils Absolute: 0.1 10*3/uL (ref 0.0–0.1)
Basophils Relative: 1 %
Eosinophils Absolute: 0 10*3/uL (ref 0.0–0.5)
Eosinophils Relative: 0 %
HCT: 40.9 % (ref 36.0–46.0)
Hemoglobin: 13 g/dL (ref 12.0–15.0)
Immature Granulocytes: 2 %
Lymphocytes Relative: 17 %
Lymphs Abs: 1 10*3/uL (ref 0.7–4.0)
MCH: 29.7 pg (ref 26.0–34.0)
MCHC: 31.8 g/dL (ref 30.0–36.0)
MCV: 93.4 fL (ref 80.0–100.0)
Monocytes Absolute: 0.7 10*3/uL (ref 0.1–1.0)
Monocytes Relative: 12 %
Neutro Abs: 4.1 10*3/uL (ref 1.7–7.7)
Neutrophils Relative %: 68 %
Platelets: 240 10*3/uL (ref 150–400)
RBC: 4.38 MIL/uL (ref 3.87–5.11)
RDW: 14.8 % (ref 11.5–15.5)
WBC: 5.9 10*3/uL (ref 4.0–10.5)
nRBC: 0 % (ref 0.0–0.2)

## 2023-05-09 LAB — LACTIC ACID, PLASMA
Lactic Acid, Venous: 3.4 mmol/L (ref 0.5–1.9)
Lactic Acid, Venous: 2 mmol/L (ref 0.5–1.9)

## 2023-05-09 LAB — COMPREHENSIVE METABOLIC PANEL
ALT: 27 U/L (ref 0–44)
AST: 40 U/L (ref 15–41)
Albumin: 3.7 g/dL (ref 3.5–5.0)
Alkaline Phosphatase: 51 U/L (ref 38–126)
Anion gap: 14 (ref 5–15)
BUN: 8 mg/dL (ref 8–23)
CO2: 21 mmol/L — ABNORMAL LOW (ref 22–32)
Calcium: 9.5 mg/dL (ref 8.9–10.3)
Chloride: 101 mmol/L (ref 98–111)
Creatinine, Ser: 0.6 mg/dL (ref 0.44–1.00)
GFR, Estimated: >60 mL/min (ref >60)
Glucose, Bld: 211 mg/dL — ABNORMAL HIGH (ref 70–99)
Potassium: 3.3 mmol/L — ABNORMAL LOW (ref 3.5–5.1)
Sodium: 136 mmol/L (ref 135–145)
Total Bilirubin: 0.8 mg/dL (ref 0.0–1.2)
Total Protein: 7.5 g/dL (ref 6.5–8.1)

## 2023-05-09 LAB — PROTIME-INR
INR: 1.2 (ref 0.8–1.2)
Prothrombin Time: 15 s (ref 11.4–15.2)

## 2023-05-09 MED ORDER — PANTOPRAZOLE SODIUM 40 MG PO TBEC
40.0000 mg | DELAYED_RELEASE_TABLET | Freq: Two times a day (BID) | ORAL | Status: DC
  Administered 2023-05-09 – 2023-05-10 (×2): 40 mg via ORAL
  Filled 2023-05-09 (×2): qty 1

## 2023-05-09 MED ORDER — OYSTER SHELL CALCIUM/D3 500-5 MG-MCG PO TABS
1.0000 | ORAL_TABLET | Freq: Two times a day (BID) | ORAL | Status: DC
  Administered 2023-05-09 – 2023-05-10 (×2): 1 via ORAL
  Filled 2023-05-09 (×2): qty 1

## 2023-05-09 MED ORDER — ADULT MULTIVITAMIN W/MINERALS CH
1.0000 | ORAL_TABLET | Freq: Every day | ORAL | Status: DC
  Administered 2023-05-10: 1 via ORAL
  Filled 2023-05-09: qty 1

## 2023-05-09 MED ORDER — DOCUSATE SODIUM 100 MG PO CAPS
100.0000 mg | ORAL_CAPSULE | Freq: Two times a day (BID) | ORAL | Status: DC
  Administered 2023-05-09 – 2023-05-10 (×2): 100 mg via ORAL
  Filled 2023-05-09 (×2): qty 1

## 2023-05-09 MED ORDER — VENLAFAXINE HCL ER 150 MG PO CP24
150.0000 mg | ORAL_CAPSULE | Freq: Every day | ORAL | Status: DC
  Administered 2023-05-10: 150 mg via ORAL
  Filled 2023-05-09: qty 1

## 2023-05-09 MED ORDER — CEFTRIAXONE SODIUM 2 G IJ SOLR
2.0000 g | Freq: Once | INTRAMUSCULAR | Status: AC
  Administered 2023-05-09: 2 g via INTRAVENOUS
  Filled 2023-05-09: qty 20

## 2023-05-09 MED ORDER — ACETAMINOPHEN 325 MG RE SUPP: 650.0000 mg | Freq: Four times a day (QID) | RECTAL | Status: DC | PRN

## 2023-05-09 MED ORDER — BUSPIRONE HCL 5 MG PO TABS
5.0000 mg | ORAL_TABLET | Freq: Every day | ORAL | Status: DC
  Administered 2023-05-10: 5 mg via ORAL
  Filled 2023-05-09: qty 1

## 2023-05-09 MED ORDER — APIXABAN 5 MG PO TABS
5.0000 mg | ORAL_TABLET | Freq: Two times a day (BID) | ORAL | Status: DC
  Administered 2023-05-09 – 2023-05-10 (×2): 5 mg via ORAL
  Filled 2023-05-09 (×2): qty 1

## 2023-05-09 MED ORDER — POTASSIUM CHLORIDE 20 MEQ PO PACK
40.0000 meq | PACK | Freq: Once | ORAL | Status: AC
  Administered 2023-05-09: 40 meq via ORAL
  Filled 2023-05-09: qty 2

## 2023-05-09 MED ORDER — SODIUM CHLORIDE 0.9 % IV SOLN: 500.0000 mg | INTRAVENOUS | Status: DC

## 2023-05-09 MED ORDER — SODIUM CHLORIDE 0.9 % IV SOLN: 2.0000 g | INTRAVENOUS | Status: DC

## 2023-05-09 MED ORDER — DIGOXIN 250 MCG PO TABS
250.0000 ug | ORAL_TABLET | Freq: Every day | ORAL | Status: DC
  Administered 2023-05-10: 250 ug via ORAL
  Filled 2023-05-09: qty 1

## 2023-05-09 MED ORDER — BUSPIRONE HCL 5 MG PO TABS
10.0000 mg | ORAL_TABLET | Freq: Every day | ORAL | Status: DC
  Administered 2023-05-09: 10 mg via ORAL
  Filled 2023-05-09: qty 2

## 2023-05-09 MED ORDER — LORAZEPAM 2 MG/ML IJ SOLN
1.0000 mg | Freq: Once | INTRAMUSCULAR | Status: AC
Start: 2023-05-09 — End: 2023-05-09
  Administered 2023-05-09: 1 mg via INTRAVENOUS
  Filled 2023-05-09: qty 1

## 2023-05-09 MED ORDER — SODIUM CHLORIDE 0.9 % IV BOLUS (SEPSIS)
1000.0000 mL | Freq: Once | INTRAVENOUS | Status: AC
  Administered 2023-05-09: 1000 mL via INTRAVENOUS

## 2023-05-09 MED ORDER — SODIUM CHLORIDE 0.9 % IV SOLN
500.0000 mg | Freq: Once | INTRAVENOUS | Status: AC
  Administered 2023-05-09: 500 mg via INTRAVENOUS
  Filled 2023-05-09: qty 5

## 2023-05-09 MED ORDER — ENOXAPARIN SODIUM 40 MG/0.4ML IJ SOSY: 40.0000 mg | PREFILLED_SYRINGE | INTRAMUSCULAR | Status: DC

## 2023-05-09 MED ORDER — ACETAMINOPHEN 325 MG PO TABS: 650.0000 mg | ORAL_TABLET | Freq: Four times a day (QID) | ORAL | Status: DC | PRN

## 2023-05-09 MED ORDER — LINACLOTIDE 145 MCG PO CAPS
145.0000 ug | ORAL_CAPSULE | Freq: Every day | ORAL | Status: DC
  Administered 2023-05-10: 145 ug via ORAL
  Filled 2023-05-09: qty 1

## 2023-05-09 MED ORDER — ALPRAZOLAM 0.5 MG PO TABS
0.5000 mg | ORAL_TABLET | Freq: Every evening | ORAL | Status: DC | PRN
  Administered 2023-05-09: 0.5 mg via ORAL
  Filled 2023-05-09: qty 1

## 2023-05-09 MED ORDER — GUAIFENESIN 100 MG/5ML PO LIQD
5.0000 mL | ORAL | Status: DC | PRN
  Administered 2023-05-09 – 2023-05-10 (×2): 5 mL via ORAL
  Filled 2023-05-09 (×2): qty 10

## 2023-05-09 MED ORDER — ALBUTEROL SULFATE (2.5 MG/3ML) 0.083% IN NEBU
2.5000 mg | INHALATION_SOLUTION | Freq: Four times a day (QID) | RESPIRATORY_TRACT | Status: DC | PRN
Start: 2023-05-09 — End: 2023-05-10
  Administered 2023-05-10: 2.5 mg via RESPIRATORY_TRACT
  Filled 2023-05-09: qty 3

## 2023-05-09 MED ORDER — VENLAFAXINE HCL ER 37.5 MG PO CP24
37.5000 mg | ORAL_CAPSULE | Freq: Every day | ORAL | Status: DC
Start: 2023-05-10 — End: 2023-05-10
  Administered 2023-05-10: 37.5 mg via ORAL
  Filled 2023-05-09: qty 1

## 2023-05-09 MED ORDER — LOSARTAN POTASSIUM 50 MG PO TABS
25.0000 mg | ORAL_TABLET | Freq: Every day | ORAL | Status: DC
  Administered 2023-05-10: 25 mg via ORAL
  Filled 2023-05-09: qty 1

## 2023-05-09 MED ORDER — METOPROLOL TARTRATE 25 MG PO TABS
25.0000 mg | ORAL_TABLET | Freq: Two times a day (BID) | ORAL | Status: DC
  Administered 2023-05-09 – 2023-05-10 (×2): 25 mg via ORAL
  Filled 2023-05-09 (×2): qty 1

## 2023-05-09 MED ORDER — VITAMIN D 25 MCG (1000 UNIT) PO TABS
2000.0000 [IU] | ORAL_TABLET | Freq: Every day | ORAL | Status: DC
  Administered 2023-05-10: 2000 [IU] via ORAL
  Filled 2023-05-09: qty 2

## 2023-05-09 MED ORDER — SENNA 8.6 MG PO TABS
1.0000 | ORAL_TABLET | Freq: Two times a day (BID) | ORAL | Status: DC
  Administered 2023-05-09 – 2023-05-10 (×2): 8.6 mg via ORAL
  Filled 2023-05-09 (×2): qty 1

## 2023-05-09 MED ORDER — LACTATED RINGERS IV SOLN
150.0000 mL/h | INTRAVENOUS | Status: DC
  Administered 2023-05-09 – 2023-05-10 (×2): 150 mL/h via INTRAVENOUS

## 2023-05-09 NOTE — Assessment & Plan Note (Signed)
BP goal <130/80.  Continue losartan 25 mg daily, metoprolol tartrate 50 mg daily and follow-up with cardiology.  Will continue to monitor and coordinate care with cardiology.

## 2023-05-09 NOTE — Patient Instructions (Signed)
Go to the emergency room to be evaluated so that they can figure out the most appropriate treatment to get you feeling better and breathing better.  If the hospital in [place] is closest to home for you, you can absolutely go there!  In the future, if you do experience any heart related problems, chest pain, or trouble breathing, I would recommend contacting cardiology as they will be the experts on heart issues.  For anything else, you can contact us.

## 2023-05-09 NOTE — H&P (Signed)
History and Physical    Patient: Lisa Gutierrez FAO:[id number] DOB: 03-15-1951 DOA: 05/09/2023 DOS: the patient was seen and examined on 05/09/2023 PCP: [name], PA  Patient coming from: Home  Chief Complaint:  Chief Complaint  Patient presents with   Shortness of Breath   HPI: Lisa Gutierrez is a 73 y.o. female with medical history significant of hypertension, type 2 diabetes mellitus, anxiety, depression, atrial fibrillation status post recent ablation on Thursday presented to the emergency department for evaluation of worsening cough, shortness of breath and fever since last 4 days.  Patient states that her husband has been sick last week.  Her fever was 2 days ago when it was 102.  Cough is more worse, bringing up some greenish phlegm.  Feels weak.  Chest and throat sore due to severe cough.  Has some nausea denies any abdominal pain, vomiting.  No urinary symptoms.    Upon arrival to the emergency department patient was noted to be hypoxic at 89%, requiring 2 L supplemental oxygen.  She was noted to be tachycardic, tachypneic.  Laboratory findings showed potassium 3.3, glucose 211, lactic acid 3.4 blood cultures obtained respiratory viral panel pending chest x-ray showed enlarged heart with mild interstitial changes, left lung opacity possible infiltrate.  ED provider initiated sepsis protocol, got IV fluids, IV antibiotics Rocephin and azithromycin, hospitalist consulted for further management evaluation.  Review of Systems: As mentioned in the history of present illness. All other systems reviewed and are negative. Past Medical History:  Diagnosis Date   Anxiety    Atrial fibrillation (HCC)    Depression    Depression    Phreesia 07/02/2020   Diabetes mellitus without complication (HCC)    GERD (gastroesophageal reflux disease)    Hypertension    IBS (irritable bowel syndrome)    Neuromuscular disorder (HCC)    Past Surgical History:  Procedure  Laterality Date   ATRIAL FIBRILLATION ABLATION N/A 05/02/2023   Procedure: ATRIAL FIBRILLATION ABLATION;  Surgeon: Lanier Prude, MD;  Location: [facility];  Service: Cardiovascular;  Laterality: N/A;   BREAST EXCISIONAL BIOPSY     CARDIOVERSION N/A 09/03/2022   Procedure: CARDIOVERSION;  Surgeon: Iran Ouch, MD;  Location: [facility];  Service: Cardiovascular;  Laterality: N/A;   CATARACT EXTRACTION W/PHACO Right 07/24/2022   Procedure: CATARACT EXTRACTION PHACO AND INTRAOCULAR LENS PLACEMENT (IOC) RIGHT DIABETIC  5.93  00:42.5;  Surgeon: Galen Manila, MD;  Location: [facility];  Service: Ophthalmology;  Laterality: Right;   CATARACT EXTRACTION W/PHACO Left 08/07/2022   Procedure: CATARACT EXTRACTION PHACO AND INTRAOCULAR LENS PLACEMENT (IOC) LEFT DIABETIC  12.33  00:58.3;  Surgeon: Galen Manila, MD;  Location: [facility];  Service: Ophthalmology;  Laterality: Left;   CESAREAN SECTION N/A    Phreesia 07/02/2020   CHOLECYSTECTOMY     EXCISION / BIOPSY BREAST / NIPPLE / DUCT Right 1973   duct removed   SPINE SURGERY N/A    Phreesia 07/02/2020   TUBAL LIGATION N/A    Phreesia 07/02/2020   Social History:  reports that she has quit smoking. She has been exposed to tobacco smoke. She has never used smokeless tobacco. She reports that she does not drink alcohol and does not use drugs.  Allergies  Allergen Reactions   Aspirin Other (See Comments)    GI upset   Ibuprofen Other (See Comments)    GERD   Levofloxacin Other (See Comments)    Weight gain   Meloxicam Other (See  Comments)    GI upset   Morphine Other (See Comments)   Naprosyn  [Naproxen] Other (See Comments)    GI upset   Nsaids Other (See Comments)    GI upset   Ozempic (0.25 Or 0.5 Mg-Dose) [Semaglutide(0.25 Or 0.5mg -Dos)] Nausea And Vomiting   Propofol Other (See Comments)    Had horrible nightmares   Quinolones    Robinul  [Glycopyrrolate] Nausea And Vomiting   Penicillin V  Potassium Rash   Sulfa Antibiotics Rash    Family History  Problem Relation Age of Onset   Diabetes Mother    Hypertension Mother    Coronary artery disease Father    Glaucoma Father    Breast cancer Neg Hx     Prior to Admission medications   Medication Sig Start Date End Date Taking? Authorizing Provider  acetaminophen (TYLENOL) 500 MG tablet Take 1,000 mg by mouth every 8 (eight) hours as needed for mild pain (pain score 1-3), moderate pain (pain score 4-6) or headache.    [provider]  ALPRAZolam Prudy Feeler) 0.5 MG tablet Take 1 tablet (0.5 mg total) by mouth at bedtime as needed for anxiety or sleep. Patient taking differently: Take 0.5 mg by mouth at bedtime. 12/27/22   [name], PA  apixaban (ELIQUIS) 5 MG TABS tablet Take 1 tablet (5 mg total) by mouth 2 (two) times daily. 04/22/23   [name] A, PA  BLACK ELDERBERRY PO Take 1 tablet by mouth daily.    [provider]  Blood Glucose Monitoring Suppl (ONE TOUCH ULTRA 2) w/Device KIT CHECK IN THE MORNING, AT NOON, AND AT BEDTIME. MAY SUBSTITUTE TO ANY MANUFACTURER COVERED BY PATIENT'S INSURANCE. 03/04/23   [name] A, PA  busPIRone (BUSPAR) 5 MG tablet Take 1 tablet (5 mg total) by mouth daily AND 2 tablets (10 mg total) every evening. 04/06/23 06/05/23  [name], MD  Calcium Carb-Cholecalciferol (CALCIUM 600/VITAMIN D3) 600-20 MG-MCG TABS Take 1 tablet by mouth 2 (two) times daily.    [provider]  Cholecalciferol (VITAMIN D3) 50 MCG (2000 UT) capsule Take 2,000 Units by mouth daily.    [provider]  clobetasol ointment (TEMOVATE) 0.05 % Apply topically 2 (two) times daily. Apply topically to affected twice daily until improved. Patient taking differently: Apply 1 Application topically 2 (two) times daily as needed (progeria nodularis). 09/18/22   Carlean Jews, NP  colchicine 0.6 MG tablet Take 1 tablet (0.6 mg total) by mouth 2 (two) times daily for 5 days. 05/02/23  05/07/23  Sherie Don, NP  digoxin (LANOXIN) 0.25 MG tablet Take 1 tablet (250 mcg total) by mouth daily. 03/25/23   Charlsie Quest, NP  Docusate Calcium (STOOL SOFTENER PO) Take 2 tablets by mouth at bedtime.    [provider]  empagliflozin (JARDIANCE) 25 MG TABS tablet Take 1 tablet (25 mg total) by mouth daily before breakfast. 02/06/23   [name] A, PA  Flaxseed, Linseed, (FLAXSEED OIL) 1400 MG CAPS Take 1,400 mg by mouth 2 (two) times daily.    [provider]  furosemide (LASIX) 20 MG tablet TAKE 1 TABLET BY MOUTH EVERY DAY Patient taking differently: Take 20 mg by mouth daily as needed for fluid or edema. 10/08/22   Charlsie Quest, NP  Glucose Blood (BLOOD GLUCOSE TEST STRIPS) STRP 1 each by In Vitro route in the morning, at noon, and at bedtime. May substitute to any manufacturer covered by patient's insurance. 02/11/23   [name], PA  glucose blood test strip Use as instructed 02/11/23   [name], PA  LINZESS 145 MCG CAPS capsule Take 1 capsule (145 mcg total) by mouth daily. 01/10/23   [name], PA  losartan (COZAAR) 25 MG tablet Take 1 tablet (25 mg total) by mouth daily. 07/18/22   Hammock, Lavonna Rua, NP  metFORMIN (GLUCOPHAGE-XR) 500 MG 24 hr tablet TAKE 1 TABLET BY MOUTH 2 TIMES DAILY WITH A MEAL. 03/13/23   [name] A, PA  metoprolol tartrate (LOPRESSOR) 50 MG tablet TAKE 1.5 TABLETS BY MOUTH 2 TIMES DAILY. 05/06/23   Hammock, Lavonna Rua, NP  montelukast (SINGULAIR) 10 MG tablet Take 1 tablet (10 mg total) by mouth at bedtime. Patient not taking: Reported on 05/09/2023 12/27/22   [name], PA  Multiple Vitamins-Minerals (MULTIVITAMIN ADULT PO) Take 1 tablet by mouth daily. One a day    [provider]  pantoprazole (PROTONIX) 40 MG tablet TAKE 1 TABLET BY MOUTH TWICE A DAY 04/18/23   Edstrom, Morgan A, PA  RYBELSUS 7 MG TABS Take 7 mg by mouth daily.    [provider]  venlafaxine XR (EFFEXOR-XR) 150 MG 24 hr  capsule Take 1 capsule (150 mg total) by mouth daily with breakfast. Take total of 187.5 mg daily. Take along with 37.5 mg cap 05/06/23 08/04/23  [name], MD  venlafaxine XR (EFFEXOR-XR) 37.5 MG 24 hr capsule Take 1 capsule (37.5 mg total) by mouth daily with breakfast. Take total of 187.5 mg daily. Take along with 150 mg cap 05/06/23 08/04/23  [name], MD    Physical Exam: Vitals:   05/09/23 1156  BP: (!) 158/76  Pulse: 96  Resp: (!) 24  Temp: 98 F (36.7 C)  SpO2: (!) 89%   General - Elderly obese Caucasian female, mild respiratory distress, coughing HEENT - PERRLA, EOMI, atraumatic head, non tender sinuses. Lung -distant breath sounds, diffuse rhonchi, wheezes. Heart - S1, S2 heard, no murmurs, rubs, trace pedal edema. Abdomen - soft, nontender, obese, bowel sounds good Neuro - Alert, awake and oriented x 3, non focal exam. Skin - Warm and dry. Data Reviewed:     Latest Ref Rng & Units 05/09/2023   11:59 AM 05/02/2023    6:04 AM 03/26/2023    1:14 PM  CBC  WBC 4.0 - 10.5 K/uL 5.9  9.4  8.6   Hemoglobin 12.0 - 15.0 g/dL 16.1  09.6  04.5   Hematocrit 36.0 - 46.0 % 40.9  44.0  45.1   Platelets 150 - 400 K/uL 240  229  235       Latest Ref Rng & Units 05/09/2023   11:59 AM 05/02/2023    6:04 AM 03/26/2023    1:14 PM  BMP  Glucose 70 - 99 mg/dL 409  811  914   BUN 8 - 23 mg/dL 8  12  11    Creatinine 0.44 - 1.00 mg/dL 7.82  9.56  2.13   BUN/Creat Ratio 12 - 28   16   Sodium 135 - 145 mmol/L 136  137  137   Potassium 3.5 - 5.1 mmol/L 3.3  4.2  4.2   Chloride 98 - 111 mmol/L 101  103  101   CO2 22 - 32 mmol/L 21  22  18    Calcium 8.9 - 10.3 mg/dL 9.5  9.6  08.6    DG Chest 2 View Result Date: 05/09/2023 CLINICAL DATA:  Sepsis EXAM: CHEST - 2 VIEW COMPARISON:  X-ray 07/10/2022 FINDINGS: Enlarged  cardiopericardial silhouette. Mild vascular congestion with some questionable interstitial edema. Subtle patchy opacity as well left mid lower lung. Atelectasis versus  infiltrate. Recommend follow-up. No pneumothorax or effusion. Calcified aorta. Lungs are penetrated. IMPRESSION: Enlarged heart with some mild digestion interstitial changes. Subtle opacity is well left lung base. Atelectasis versus infiltrate. Recommend follow-up Electronically Signed   By: Karen Kays M.D.   On: 05/09/2023 13:05     Assessment and Plan:  Lisa Gutierrez is a 73 y.o. female with medical history significant of hypertension, type 2 diabetes mellitus, anxiety, depression, atrial fibrillation status post recent ablation on Thursday presented to the emergency department for evaluation of worsening cough, shortness of breath and fever since last 4 days.  Husband sick at home, had fever of 102, was tachypneic tachycardic in the ED. sepsis protocol initiated and she is being admitted for further management evaluation for sepsis due to pneumonia.  Plan: Sepsis due to pneumonia Sepsis criteria include heart rate greater than 90, respiratory to greater than 20, hypoxia, chest x-ray finding of infiltrate, lactic acid 3.4. Check respiratory viral panel. Check sputum cultures, urine Legionella, strep. Follow blood cultures. Continue Rocephin, azithromycin therapy. Continue Mucinex, DuoNebs as needed. Continue supplemental oxygen to maintain saturation greater than 92%. Admit the patient to medical telemetry monitoring.  Atrial fibrillation: Patient had ablation last Thursday. Continue home medications digoxin, metoprolol, Eliquis therapy.  Hypertension: Continue beta-blocker, losartan therapy.  Type 2 diabetes mellitus: Home medications- Rybelsus, Jardiance, metformin. Hold oral hypoglycemics. Continue Accu-Cheks, sliding scale insulin as per floor protocol.  Anxiety and depression: Patient will be continued on BuSpar, venlafaxine, alprazolam as needed.  Obesity BMI 36- Encourage diet, exercise and weight reduction.  PT OT evaluation. Fall, aspiration  precautions. Nursing supportive care. DVT prophylaxis with Lovenox.  Advance Care Planning:   Code Status: Prior FULL CODE  Consults: none  Family Communication: I discussed the care plan with patient, she understands and agrees  Severity of Illness: The appropriate patient status for this patient is INPATIENT. Inpatient status is judged to be reasonable and necessary in order to provide the required intensity of service to ensure the patient's safety. The patient's presenting symptoms, physical exam findings, and initial radiographic and laboratory data in the context of their chronic comorbidities is felt to place them at high risk for further clinical deterioration. Furthermore, it is not anticipated that the patient will be medically stable for discharge from the hospital within 2 midnights of admission.   * I certify that at the point of admission it is my clinical judgment that the patient will require inpatient hospital care spanning beyond 2 midnights from the point of admission due to high intensity of service, high risk for further deterioration and high frequency of surveillance required.*  Author: Marcelino Duster, MD 05/09/2023 3:31 PM  For on call review [email or web address].

## 2023-05-09 NOTE — Consult Note (Signed)
CODE SEPSIS - PHARMACY COMMUNICATION  **Broad Spectrum Antibiotics should be administered within 1 hour of Sepsis diagnosis**  Time Code Sepsis Called/Page Received: 1432  Antibiotics Ordered: ceftriaxone, azithromycin  Time of 1st antibiotic administration: 1532  Additional action taken by pharmacy: n/a  If necessary, Name of Provider/Nurse Contacted: n/a    Bettey Costa ,PharmD Clinical Pharmacist  05/09/2023  2:40 PM

## 2023-05-09 NOTE — ED Provider Notes (Signed)
[facility] Provider Note    Event Date/Time   First MD Initiated Contact with Patient 05/09/23 1325     (approximate)   History   Shortness of Breath   HPI  Lisa Gutierrez is a 73 year old female presenting to the emergency department for evaluation of HTN, diabetes presenting to the emergency department for evaluation of shortness of breath.  Husband recently sick with upper respiratory symptoms.  I did give, patient had onset of congestion and runny nose with sore throat.  Later developed productive cough..  Has been using an albuterol inhaler that was previously prescribed with limited benefit.  Contacted her cardiologist who recommended ER presentation but patient declined.  Had primary care visit scheduled for today where she was noted to be hypoxic to 89% in the office and directed to the ER for further evaluation.  No history of asthma or COPD.  Subjective fevers at home.     Physical Exam   Triage Vital Signs: ED Triage Vitals  Encounter Vitals Group     BP 05/09/23 1156 (!) 158/76     Systolic BP Percentile --      Diastolic BP Percentile --      Pulse Rate 05/09/23 1156 96     Resp 05/09/23 1156 (!) 24     Temp 05/09/23 1156 98 F (36.7 C)     Temp src --      SpO2 05/09/23 1156 (!) 89 %     Weight --      Height --      Head Circumference --      Peak Flow --      Pain Score 05/09/23 1157 0     Pain Loc --      Pain Education --      Exclude from Growth Chart --     Most recent vital signs: Vitals:   05/09/23 1156  BP: (!) 158/76  Pulse: 96  Resp: (!) 24  Temp: 98 F (36.7 C)  SpO2: (!) 89%     General: Awake, interactive  CV:  Regular rate, good peripheral perfusion. Resp:  Unlabored respirations, satting in the mid 90s on nasal cannula, lung sounds mildly coarse Abd:  Nondistended. Neuro:  Symmetric facial movement, fluid speech   ED Results / Procedures / Treatments   Labs (all labs ordered are listed,  but only abnormal results are displayed) Labs Reviewed  LACTIC ACID, PLASMA - Abnormal; Notable for the following components:      Result Value   Lactic Acid, Venous 3.4 (*)    All other components within normal limits  CBC WITH DIFFERENTIAL/PLATELET - Abnormal; Notable for the following components:   Abs Immature Granulocytes 0.11 (*)    All other components within normal limits  COMPREHENSIVE METABOLIC PANEL - Abnormal; Notable for the following components:   Potassium 3.3 (*)    CO2 21 (*)    Glucose, Bld 211 (*)    All other components within normal limits  CULTURE, BLOOD (ROUTINE X 2)  CULTURE, BLOOD (ROUTINE X 2)  RESP PANEL BY RT-PCR (RSV, FLU A&B, COVID)  RVPGX2  PROTIME-INR  LACTIC ACID, PLASMA  CBC WITH DIFFERENTIAL/PLATELET     EKG EKG independently reviewed interpreted by myself (ER attending) demonstrates:  EKG demonstrates sinus rhythm a rate of 100, PR 184, QRS 88, QTc 472, no acute ST changes  RADIOLOGY Imaging independently reviewed and interpreted by myself demonstrates:  CXR with possible edema as well as opacity  over the left base concerning for infiltrate  PROCEDURES:  Critical Care performed: Yes, see critical care procedure note(s)  CRITICAL CARE Performed by: Trinna Post   Total critical care time: 32 minutes  Critical care time was exclusive of separately billable procedures and treating other patients.  Critical care was necessary to treat or prevent imminent or life-threatening deterioration.  Critical care was time spent personally by me on the following activities: development of treatment plan with patient and/or surrogate as well as nursing, discussions with consultants, evaluation of patient's response to treatment, examination of patient, obtaining history from patient or surrogate, ordering and performing treatments and interventions, ordering and review of laboratory studies, ordering and review of radiographic studies, pulse oximetry and  re-evaluation of patient's condition.   Procedures   MEDICATIONS ORDERED IN ED: Medications  sodium chloride 0.9 % bolus 1,000 mL (has no administration in time range)  cefTRIAXone (ROCEPHIN) 2 g in sodium chloride 0.9 % 100 mL IVPB (has no administration in time range)  azithromycin (ZITHROMAX) 500 mg in sodium chloride 0.9 % 250 mL IVPB (has no administration in time range)     IMPRESSION / MDM / ASSESSMENT AND PLAN / ED COURSE  I reviewed the triage vital signs and the nursing notes.  Differential diagnosis includes, but is not limited to, viral illness, pneumonia, lower suspicion CHF  Patient's presentation is most consistent with acute presentation with potential threat to life or bodily function.  73 year old female presenting with shortness of breath found to be hypoxic in the office and on presentation here.  Improved to the mid 90s on 2 L without significant increased work of breathing.  X-Lisa Gutierrez is concerning for pneumonia. Ordered for empiric Rocephin and azithromycin as well as a liter of fluid in the setting of lactic acidosis with lactate of 3.4.  Discussed patient and patient is agreeable.  Will reach out to hospitalist team.   3:15 PM Case discussed hospitalist team.  They will evaluate patient for anticipated admission.    FINAL CLINICAL IMPRESSION(S) / ED DIAGNOSES   Final diagnoses:  Community acquired pneumonia, unspecified laterality  Hypoxia     Rx / DC Orders   ED Discharge Orders     None        Note:  This document was prepared using Dragon voice recognition software and may include unintentional dictation errors.   Trinna Post, MD 05/09/23 [phone]

## 2023-05-09 NOTE — ED Notes (Signed)
Dr Rosalia Hammers notified lactic 3.4

## 2023-05-09 NOTE — Progress Notes (Signed)
Established Patient Office Visit  Subjective   Patient ID: Lisa Gutierrez, female    DOB: 06-25-1950  Age: 73 y.o. MRN: [id number]  Chief Complaint  Patient presents with   Cough    HPI Lisa Gutierrez is a 73 y.o. female presenting today initially scheduled as follow up of hypertension, hyperlipidemia, diabetes, but is now in need of an acute visit.  She reports about 5 days now of difficulty breathing and wheezing.  She contacted cardiology several times and was recommended to go to urgent care or the ER for evaluation.  She did not follow this recommendation, on 05/04/2023 stating that "I didn't want go to those places because of all the sickness going around I have been throwing up and some diarrhea ".  She called cardiology again with continue trouble breathing, albuterol improved the trouble breathing some but she still reported irritation in the back of her throat and temperature of 99.8 and 100.5 on 05/08/2023.  Again, cardiology recommended evaluation in the ER given difficulty breathing and fever.  Patient again declined, stating that she had an appointment with her PCP the next day. Today, she continues to experience shortness of breath, cough, and fever.  She has been using an albuterol inhaler with minimal improvement.  Outpatient Medications Prior to Visit  Medication Sig   acetaminophen (TYLENOL) 500 MG tablet Take 1,000 mg by mouth every 8 (eight) hours as needed for mild pain (pain score 1-3), moderate pain (pain score 4-6) or headache.   ALPRAZolam (XANAX) 0.5 MG tablet Take 1 tablet (0.5 mg total) by mouth at bedtime as needed for anxiety or sleep. (Patient taking differently: Take 0.5 mg by mouth at bedtime.)   apixaban (ELIQUIS) 5 MG TABS tablet Take 1 tablet (5 mg total) by mouth 2 (two) times daily.   BLACK ELDERBERRY PO Take 1 tablet by mouth daily.   Blood Glucose Monitoring Suppl (ONE TOUCH ULTRA 2) w/Device KIT CHECK IN THE MORNING, AT NOON, AND AT  BEDTIME. MAY SUBSTITUTE TO ANY MANUFACTURER COVERED BY PATIENT'S INSURANCE.   busPIRone (BUSPAR) 5 MG tablet Take 1 tablet (5 mg total) by mouth daily AND 2 tablets (10 mg total) every evening.   Calcium Carb-Cholecalciferol (CALCIUM 600/VITAMIN D3) 600-20 MG-MCG TABS Take 1 tablet by mouth 2 (two) times daily.   Cholecalciferol (VITAMIN D3) 50 MCG (2000 UT) capsule Take 2,000 Units by mouth daily.   clobetasol ointment (TEMOVATE) 0.05 % Apply topically 2 (two) times daily. Apply topically to affected twice daily until improved. (Patient taking differently: Apply 1 Application topically 2 (two) times daily as needed (progeria nodularis).)   digoxin (LANOXIN) 0.25 MG tablet Take 1 tablet (250 mcg total) by mouth daily.   Docusate Calcium (STOOL SOFTENER PO) Take 2 tablets by mouth at bedtime.   empagliflozin (JARDIANCE) 25 MG TABS tablet Take 1 tablet (25 mg total) by mouth daily before breakfast.   Flaxseed, Linseed, (FLAXSEED OIL) 1400 MG CAPS Take 1,400 mg by mouth 2 (two) times daily.   furosemide (LASIX) 20 MG tablet TAKE 1 TABLET BY MOUTH EVERY DAY (Patient taking differently: Take 20 mg by mouth daily as needed for fluid or edema.)   Glucose Blood (BLOOD GLUCOSE TEST STRIPS) STRP 1 each by In Vitro route in the morning, at noon, and at bedtime. May substitute to any manufacturer covered by patient's insurance.   glucose blood test strip Use as instructed   LINZESS 145 MCG CAPS capsule Take 1 capsule (145 mcg total) by mouth  daily.   losartan (COZAAR) 25 MG tablet Take 1 tablet (25 mg total) by mouth daily.   metFORMIN (GLUCOPHAGE-XR) 500 MG 24 hr tablet TAKE 1 TABLET BY MOUTH 2 TIMES DAILY WITH A MEAL.   metoprolol tartrate (LOPRESSOR) 50 MG tablet TAKE 1.5 TABLETS BY MOUTH 2 TIMES DAILY.   Multiple Vitamins-Minerals (MULTIVITAMIN ADULT PO) Take 1 tablet by mouth daily. One a day   pantoprazole (PROTONIX) 40 MG tablet TAKE 1 TABLET BY MOUTH TWICE A DAY   RYBELSUS 7 MG TABS Take 7 mg by mouth  daily.   venlafaxine XR (EFFEXOR-XR) 150 MG 24 hr capsule Take 1 capsule (150 mg total) by mouth daily with breakfast. Take total of 187.5 mg daily. Take along with 37.5 mg cap   venlafaxine XR (EFFEXOR-XR) 37.5 MG 24 hr capsule Take 1 capsule (37.5 mg total) by mouth daily with breakfast. Take total of 187.5 mg daily. Take along with 150 mg cap   colchicine 0.6 MG tablet Take 1 tablet (0.6 mg total) by mouth 2 (two) times daily for 5 days.   montelukast (SINGULAIR) 10 MG tablet Take 1 tablet (10 mg total) by mouth at bedtime. (Patient not taking: Reported on 05/09/2023)   No facility-administered medications prior to visit.    ROS Negative unless otherwise noted in HPI   Objective:     BP (!) 154/80   Pulse 73   Ht 5\' 5"  (1.651 m)   Wt 216 lb 12 oz (98.3 kg)   SpO2 (!) 89%   BMI 36.07 kg/m   Physical Exam Constitutional:      General: She is not in acute distress.    Appearance: Normal appearance.  HENT:     Head: Normocephalic and atraumatic.  Cardiovascular:     Rate and Rhythm: Normal rate and regular rhythm.     Heart sounds: No murmur heard.    No friction rub. No gallop.  Pulmonary:     Effort: Pulmonary effort is normal. No respiratory distress.     Breath sounds: Wheezing and rhonchi present. No rales.  Skin:    General: Skin is warm and dry.  Neurological:     Mental Status: She is alert and oriented to person, place, and time.  Psychiatric:        Mood and Affect: Mood is anxious. Affect is tearful.     Assessment & Plan:  Shortness of breath  Acute cough  Hypoxia  Fever, unspecified fever cause  Given current symptoms of shortness of breath, cough, and hypoxia with SpO2 of 89% in office, agree with recommendation to go to ER for evaluation and management.  We discussed that her symptoms indicate that she likely has some sort of infection.  We discussed that infection can be caused by pathogens that are spread from person to person either by direct  contact or indirect contact by touching the same surface.  Discussed that primary care does not have the adequate equipment for evaluation and management.  Patient verbalized understanding and is planning on going to the ER at [facility].  Return in about 6 months (around 11/06/2023) for follow-up for HTN, HLD, fasting labs 1 week before.   I spent 30 minutes on the day of the encounter to include pre-visit record review of recent cardioversion and messages with cardiology, face-to-face time with the patient, and post visit documentation.  [name], PA

## 2023-05-09 NOTE — Assessment & Plan Note (Addendum)
A1c remains elevated at 9.2.  Continue working with [facility] clinic endocrinology for improved glycemic control. She has been working with pharmacy to get assistance with medication management.  She is now taking metformin 500 mg twice daily, Jardiance 25 mg daily, and has a prescription for Rybelsus 7 mg daily.  Diabetes is now being managed by endocrinology with [facility] clinic.

## 2023-05-09 NOTE — ED Triage Notes (Signed)
Pt to ED For shob started last week worsening today. Reports started getting sick after ablation last week with runny nose and cold sx.  DOE noted.  89% on RA, placed on 2 L [place]

## 2023-05-09 NOTE — Sepsis Progress Note (Signed)
Elink will follow per sepsis protocol  

## 2023-05-09 NOTE — Assessment & Plan Note (Addendum)
History of statin intolerance,.  Last lipid panel: LDL 83, VLDL 56, HDL 24, triglycerides 340.  Recommend discussing starting fenofibrate to decrease triglyceride levels at next appointment.

## 2023-05-10 DIAGNOSIS — J189 Pneumonia, unspecified organism: Secondary | ICD-10-CM | POA: Diagnosis not present

## 2023-05-10 DIAGNOSIS — F411 Generalized anxiety disorder: Secondary | ICD-10-CM | POA: Diagnosis not present

## 2023-05-10 DIAGNOSIS — J121 Respiratory syncytial virus pneumonia: Secondary | ICD-10-CM

## 2023-05-10 DIAGNOSIS — A419 Sepsis, unspecified organism: Secondary | ICD-10-CM | POA: Diagnosis not present

## 2023-05-10 DIAGNOSIS — E1165 Type 2 diabetes mellitus with hyperglycemia: Secondary | ICD-10-CM | POA: Diagnosis not present

## 2023-05-10 DIAGNOSIS — E669 Obesity, unspecified: Secondary | ICD-10-CM | POA: Diagnosis not present

## 2023-05-10 DIAGNOSIS — I152 Hypertension secondary to endocrine disorders: Secondary | ICD-10-CM | POA: Diagnosis not present

## 2023-05-10 DIAGNOSIS — K219 Gastro-esophageal reflux disease without esophagitis: Secondary | ICD-10-CM | POA: Diagnosis not present

## 2023-05-10 DIAGNOSIS — E1159 Type 2 diabetes mellitus with other circulatory complications: Secondary | ICD-10-CM | POA: Diagnosis not present

## 2023-05-10 LAB — CBC WITH DIFFERENTIAL/PLATELET
Abs Immature Granulocytes: 0.07 10*3/uL (ref 0.00–0.07)
Basophils Absolute: 0 10*3/uL (ref 0.0–0.1)
Basophils Relative: 1 %
Eosinophils Absolute: 0 10*3/uL (ref 0.0–0.5)
Eosinophils Relative: 0 %
HCT: 37.7 % (ref 36.0–46.0)
Hemoglobin: 11.9 g/dL — ABNORMAL LOW (ref 12.0–15.0)
Immature Granulocytes: 1 %
Lymphocytes Relative: 19 %
Lymphs Abs: 1.2 10*3/uL (ref 0.7–4.0)
MCH: 30.1 pg (ref 26.0–34.0)
MCHC: 31.6 g/dL (ref 30.0–36.0)
MCV: 95.4 fL (ref 80.0–100.0)
Monocytes Absolute: 0.9 10*3/uL (ref 0.1–1.0)
Monocytes Relative: 14 %
Neutro Abs: 4.3 10*3/uL (ref 1.7–7.7)
Neutrophils Relative %: 65 %
Platelets: 228 10*3/uL (ref 150–400)
RBC: 3.95 MIL/uL (ref 3.87–5.11)
RDW: 14.7 % (ref 11.5–15.5)
WBC: 6.5 10*3/uL (ref 4.0–10.5)
nRBC: 0 % (ref 0.0–0.2)

## 2023-05-10 LAB — BASIC METABOLIC PANEL
Anion gap: 12 (ref 5–15)
BUN: 7 mg/dL — ABNORMAL LOW (ref 8–23)
CO2: 24 mmol/L (ref 22–32)
Calcium: 9.4 mg/dL (ref 8.9–10.3)
Chloride: 102 mmol/L (ref 98–111)
Creatinine, Ser: 0.55 mg/dL (ref 0.44–1.00)
GFR, Estimated: >60 mL/min (ref >60)
Glucose, Bld: 166 mg/dL — ABNORMAL HIGH (ref 70–99)
Potassium: 3.5 mmol/L (ref 3.5–5.1)
Sodium: 138 mmol/L (ref 135–145)

## 2023-05-10 LAB — EXPECTORATED SPUTUM ASSESSMENT W GRAM STAIN, RFLX TO RESP C

## 2023-05-10 LAB — STREP PNEUMONIAE URINARY ANTIGEN: Strep Pneumo Urinary Antigen: NEGATIVE

## 2023-05-10 MED ORDER — GUAIFENESIN 100 MG/5ML PO LIQD: 5.0000 mL | ORAL | 0 refills | Status: DC | PRN

## 2023-05-10 MED ORDER — BENZONATATE 100 MG PO CAPS
200.0000 mg | ORAL_CAPSULE | Freq: Once | ORAL | Status: AC
Start: 2023-05-10 — End: 2023-05-10
  Administered 2023-05-10: 200 mg via ORAL
  Filled 2023-05-10: qty 2

## 2023-05-10 MED ORDER — GUAIFENESIN ER 600 MG PO TB12: 600.0000 mg | ORAL_TABLET | Freq: Two times a day (BID) | ORAL | 2 refills | Status: DC

## 2023-05-10 NOTE — Discharge Summary (Signed)
Physician Discharge Summary   Patient: Lisa Gutierrez MRN: [id number] DOB: 1950/07/30  Admit date:     05/09/2023  Discharge date: 05/10/23  Discharge Physician: Marcelino Duster   PCP: [name], PA   Recommendations at discharge:  {Tip this will not be part of the note when signed- Example include specific recommendations for outpatient follow-up, pending tests to follow-up on. (Optional):26781}  ***  Discharge Diagnoses: Principal Problem:   Sepsis (HCC) Active Problems:   Hypertension associated with type 2 diabetes mellitus (HCC)   GAD (generalized anxiety disorder)   Gastroesophageal reflux disease without esophagitis   Type 2 diabetes mellitus with hyperglycemia, without long-term current use of insulin (HCC)   CAP (community acquired pneumonia)  Resolved Problems:   * No resolved hospital problems. [facility] Course: No notes on file  Assessment and Plan: No notes have been filed under this hospital service. Service: Hospitalist     {Tip this will not be part of the note when signed Body mass index is 35.94 kg/m. , ,  (Optional):26781}  {(NOTE) Pain control PDMP Statment (Optional):26782} Consultants: *** Procedures performed: ***  Disposition: {Plan; Disposition:26390} Diet recommendation:  Discharge Diet Orders (From admission, onward)     Start     Ordered   05/10/23 0000  Diet - low sodium heart healthy        05/10/23 1101           {Diet_Plan:26776} DISCHARGE MEDICATION: Allergies as of 05/10/2023       Reactions   Aspirin Other (See Comments)   GI upset   Ibuprofen Other (See Comments)   GERD   Levofloxacin Other (See Comments)   Weight gain   Meloxicam Other (See Comments)   GI upset   Morphine Other (See Comments)   Naprosyn  [naproxen] Other (See Comments)   GI upset   Nsaids Other (See Comments)   GI upset   Ozempic (0.25 Or 0.5 Mg-dose) [semaglutide(0.25 Or 0.5mg -dos)] Nausea And Vomiting   Propofol  Other (See Comments)   Had horrible nightmares   Quinolones    Robinul  [glycopyrrolate] Nausea And Vomiting   Penicillin V Potassium Rash   Sulfa Antibiotics Rash        Medication List     STOP taking these medications    colchicine 0.6 MG tablet       TAKE these medications    acetaminophen 500 MG tablet Commonly known as: TYLENOL Take 1,000 mg by mouth every 8 (eight) hours as needed for mild pain (pain score 1-3), moderate pain (pain score 4-6) or headache.   ALPRAZolam 0.5 MG tablet Commonly known as: XANAX Take 1 tablet (0.5 mg total) by mouth at bedtime as needed for anxiety or sleep. What changed: when to take this   apixaban 5 MG Tabs tablet Commonly known as: Eliquis Take 1 tablet (5 mg total) by mouth 2 (two) times daily.   BLACK ELDERBERRY PO Take 1 tablet by mouth daily.   busPIRone 5 MG tablet Commonly known as: BUSPAR Take 1 tablet (5 mg total) by mouth daily AND 2 tablets (10 mg total) every evening.   Calcium 600/Vitamin D3 600-20 MG-MCG Tabs Generic drug: Calcium Carb-Cholecalciferol Take 1 tablet by mouth 2 (two) times daily.   clobetasol ointment 0.05 % Commonly known as: TEMOVATE Apply topically 2 (two) times daily. Apply topically to affected twice daily until improved. What changed:  how much to take when to take this reasons to take this additional instructions  digoxin 0.25 MG tablet Commonly known as: LANOXIN Take 1 tablet (250 mcg total) by mouth daily.   empagliflozin 25 MG Tabs tablet Commonly known as: JARDIANCE Take 1 tablet (25 mg total) by mouth daily before breakfast.   Flaxseed Oil 1400 MG Caps Take 1,400 mg by mouth 2 (two) times daily.   furosemide 20 MG tablet Commonly known as: LASIX TAKE 1 TABLET BY MOUTH EVERY DAY What changed:  when to take this reasons to take this   glucose blood test strip Use as instructed   BLOOD GLUCOSE TEST STRIPS Strp 1 each by In Vitro route in the morning, at noon, and  at bedtime. May substitute to any manufacturer covered by patient's insurance.   guaiFENesin 100 MG/5ML liquid Commonly known as: ROBITUSSIN Take 5 mLs by mouth every 4 (four) hours as needed for cough or to loosen phlegm.   guaiFENesin 600 MG 12 hr tablet Commonly known as: Mucinex Take 1 tablet (600 mg total) by mouth 2 (two) times daily.   Linzess 145 MCG Caps capsule Generic drug: linaclotide Take 1 capsule (145 mcg total) by mouth daily.   losartan 25 MG tablet Commonly known as: COZAAR Take 1 tablet (25 mg total) by mouth daily.   metFORMIN 500 MG 24 hr tablet Commonly known as: GLUCOPHAGE-XR TAKE 1 TABLET BY MOUTH 2 TIMES DAILY WITH A MEAL.   metoprolol tartrate 50 MG tablet Commonly known as: LOPRESSOR TAKE 1.5 TABLETS BY MOUTH 2 TIMES DAILY.   montelukast 10 MG tablet Commonly known as: SINGULAIR Take 1 tablet (10 mg total) by mouth at bedtime.   MULTIVITAMIN ADULT PO Take 1 tablet by mouth daily. One a day   ONE TOUCH ULTRA 2 w/Device Kit CHECK IN THE MORNING, AT NOON, AND AT BEDTIME. MAY SUBSTITUTE TO ANY MANUFACTURER COVERED BY PATIENT'S INSURANCE.   pantoprazole 40 MG tablet Commonly known as: PROTONIX TAKE 1 TABLET BY MOUTH TWICE A DAY   Rybelsus 7 MG Tabs Generic drug: Semaglutide Take 7 mg by mouth daily.   STOOL SOFTENER PO Take 2 tablets by mouth at bedtime.   venlafaxine XR 37.5 MG 24 hr capsule Commonly known as: EFFEXOR-XR Take 1 capsule (37.5 mg total) by mouth daily with breakfast. Take total of 187.5 mg daily. Take along with 150 mg cap   venlafaxine XR 150 MG 24 hr capsule Commonly known as: EFFEXOR-XR Take 1 capsule (150 mg total) by mouth daily with breakfast. Take total of 187.5 mg daily. Take along with 37.5 mg cap   Vitamin D3 50 MCG (2000 UT) capsule Take 2,000 Units by mouth daily.        Discharge Exam: Filed Weights   05/09/23 1747  Weight: 98 kg   ***  Condition at discharge: {DC Condition:26389}  The results of  significant diagnostics from this hospitalization (including imaging, microbiology, ancillary and laboratory) are listed below for reference.   Imaging Studies: DG Chest 2 View Result Date: 05/09/2023 CLINICAL DATA:  Sepsis EXAM: CHEST - 2 VIEW COMPARISON:  X-ray 07/10/2022 FINDINGS: Enlarged cardiopericardial silhouette. Mild vascular congestion with some questionable interstitial edema. Subtle patchy opacity as well left mid lower lung. Atelectasis versus infiltrate. Recommend follow-up. No pneumothorax or effusion. Calcified aorta. Lungs are penetrated. IMPRESSION: Enlarged heart with some mild digestion interstitial changes. Subtle opacity is well left lung base. Atelectasis versus infiltrate. Recommend follow-up Electronically Signed   By: Karen Kays M.D.   On: 05/09/2023 13:05   EP STUDY Result Date: 05/02/2023 CONCLUSIONS: 1. Successful PVI  2. Successful ablation/isolation of the posterior wall 3. Intracardiac echo reveals trivial pericardial effusion, normal left atrial architecture 4. No early apparent complications. 5. Colchicine 0.6mg  PO BID x 5 days 6. Protonix 40mg  PO daily x 45 days   CT CARDIAC MORPH/PULM VEIN W/CM&W/O CA SCORE Addendum Date: 04/27/2023 ADDENDUM REPORT: 04/27/2023 17:51 EXAM: OVER-READ INTERPRETATION  CT CHEST The following report is an over-read performed by radiologist Dr. Aram Candela of [facility] on 04/27/2023. This over-read does not include interpretation of cardiac or coronary anatomy or pathology. The coronary calcium score/coronary CTA interpretation by the cardiologist is attached. COMPARISON:  None. FINDINGS: Cardiovascular: There are no significant extracardiac vascular findings. Mediastinum/Nodes: There are no enlarged lymph nodes within the visualized mediastinum. Lungs/Pleura: There is no pleural effusion. The visualized lungs appear clear. Upper abdomen: No significant findings in the visualized upper abdomen. Musculoskeletal/Chest wall:  No chest wall mass or suspicious osseous findings within the visualized chest. IMPRESSION: No significant extracardiac findings within the visualized chest. Electronically Signed   By: Aram Candela M.D.   On: 04/27/2023 17:51   Result Date: 04/27/2023 CLINICAL DATA:  Atrial fibrillation scheduled for an ablation. EXAM: Cardiac CT/CTA TECHNIQUE: The patient was scanned on a Siemens Somatom scanner. FINDINGS: A 120 kV prospective scan was triggered in the descending thoracic aorta at 111 HU's. Gantry rotation speed was 280 msecs and collimation was .9 mm. No beta blockade and no NTG was given. The 3D data set was reconstructed in 5% intervals of the 60-80 % of the R-R cycle. Diastolic phases were analyzed on a dedicated work station using MPR, MIP and VRT modes. The patient received 100 cc of contrast. There is normal pulmonary vein drainage into the left atrium (2 on the right and 2 on the left) with ostial measurements as follows: RUPV: 23 x 11 mm, Area 18 mm2 RLPV: 19 x 16 mm, Area 23 mm2 LUPV: 16 x 8 mm, Area 10 mm2 LLPV: 15 x 9 mm, Area 10 mm2 The left atrial appendage is a Windsock-cactus type with ostial size 29 x 23 mm and length 31 mm, Area 49 mm2. There is no thrombus in the left atrial appendage. The esophagus runs in the left atrial midline and is not in the proximity to any of the pulmonary veins. Aorta:  Normal caliber.  No dissection or calcifications. Aortic Valve:  Trileaflet.  No calcifications. Coronary Arteries: Normal coronary origin. Right dominance. The study was performed without use of NTG and insufficient for plaque evaluation. IMPRESSION: 1. There is normal pulmonary vein drainage into the left atrium. (2 on the right and 2 on the left) with ostial measurements as above. 2. The left atrial appendage is a Windsock-cactus type with ostial size 29 x 23 mm and length 31 mm, Area 49 mm2. There is no thrombus in the left atrial appendage. 3. The esophagus runs in the left atrial midline and  is not in the proximity to any of the pulmonary veins. 4. Coronary calcium score of 331. This was 85th percentile for age/gender. Electronically Signed: By: Debbe Odea M.D. On: 04/18/2023 11:48    Microbiology: Results for orders placed or performed during the hospital encounter of 05/09/23  Culture, blood (Routine x 2)     Status: None (Preliminary result)   Collection Time: 05/09/23 11:56 AM   Specimen: BLOOD  Result Value Ref Range Status   Specimen Description BLOOD BLOOD RIGHT FOREARM  Final   Special Requests   Final    BOTTLES DRAWN  AEROBIC AND ANAEROBIC Blood Culture results may not be optimal due to an inadequate volume of blood received in culture bottles   Culture   Final    NO GROWTH < 24 HOURS Performed at [facility], [address] [place], [place] [address]    Report Status PENDING  Incomplete  Resp panel by RT-PCR (RSV, Flu A&B, Covid) Anterior Nasal Swab     Status: Abnormal   Collection Time: 05/09/23  3:10 PM   Specimen: Anterior Nasal Swab  Result Value Ref Range Status   SARS Coronavirus 2 by RT PCR NEGATIVE NEGATIVE Final    Comment: (NOTE) SARS-CoV-2 target nucleic acids are NOT DETECTED.  The SARS-CoV-2 RNA is generally detectable in upper respiratory specimens during the acute phase of infection. The lowest concentration of SARS-CoV-2 viral copies this assay can detect is 138 copies/mL. A negative result does not preclude SARS-Cov-2 infection and should not be used as the sole basis for treatment or other patient management decisions. A negative result may occur with  improper specimen collection/handling, submission of specimen other than nasopharyngeal swab, presence of viral mutation(s) within the areas targeted by this assay, and inadequate number of viral copies(<138 copies/mL). A negative result must be combined with clinical observations, patient history, and epidemiological information. The expected result is Negative.  Fact  Sheet for Patients:  BloggerCourse.com  Fact Sheet for Healthcare Providers:  SeriousBroker.it  This test is no t yet approved or cleared by the [place] FDA and  has been authorized for detection and/or diagnosis of SARS-CoV-2 by FDA under an Emergency Use Authorization (EUA). This EUA will remain  in effect (meaning this test can be used) for the duration of the COVID-19 declaration under Section 564(b)(1) of the Act, 21 U.S.C.section 360bbb-3(b)(1), unless the authorization is terminated  or revoked sooner.       Influenza A by PCR NEGATIVE NEGATIVE Final   Influenza B by PCR NEGATIVE NEGATIVE Final    Comment: (NOTE) The Xpert Xpress SARS-CoV-2/FLU/RSV plus assay is intended as an aid in the diagnosis of influenza from Nasopharyngeal swab specimens and should not be used as a sole basis for treatment. Nasal washings and aspirates are unacceptable for Xpert Xpress SARS-CoV-2/FLU/RSV testing.  Fact Sheet for Patients: BloggerCourse.com  Fact Sheet for Healthcare Providers: SeriousBroker.it  This test is not yet approved or cleared by the [place] FDA and has been authorized for detection and/or diagnosis of SARS-CoV-2 by FDA under an Emergency Use Authorization (EUA). This EUA will remain in effect (meaning this test can be used) for the duration of the COVID-19 declaration under Section 564(b)(1) of the Act, 21 U.S.C. section 360bbb-3(b)(1), unless the authorization is terminated or revoked.     Resp Syncytial Virus by PCR POSITIVE (A) NEGATIVE Final    Comment: (NOTE) Fact Sheet for Patients: BloggerCourse.com  Fact Sheet for Healthcare Providers: SeriousBroker.it  This test is not yet approved or cleared by the [place] FDA and has been authorized for detection and/or diagnosis of SARS-CoV-2 by FDA  under an Emergency Use Authorization (EUA). This EUA will remain in effect (meaning this test can be used) for the duration of the COVID-19 declaration under Section 564(b)(1) of the Act, 21 U.S.C. section 360bbb-3(b)(1), unless the authorization is terminated or revoked.  Performed at [facility], [address] [place], [place] [address]   Culture, blood (Routine x 2)     Status: None (Preliminary result)   Collection Time: 05/09/23  3:11 PM  Specimen: BLOOD  Result Value Ref Range Status   Specimen Description BLOOD BLOOD LEFT ARM  Final   Special Requests   Final    BOTTLES DRAWN AEROBIC AND ANAEROBIC Blood Culture results may not be optimal due to an inadequate volume of blood received in culture bottles   Culture   Final    NO GROWTH < 24 HOURS Performed at [facility], [address], [place], [place] [address]    Report Status PENDING  Incomplete  Expectorated Sputum Assessment w Gram Stain, Rflx to Resp Cult     Status: None   Collection Time: 05/10/23  3:54 AM   Specimen: Sputum  Result Value Ref Range Status   Specimen Description SPU  Final   Special Requests NONE  Final   Sputum evaluation   Final    THIS SPECIMEN IS ACCEPTABLE FOR SPUTUM CULTURE Performed at [facility], [address] [place], [place] [address]    Report Status 05/10/2023 FINAL  Final    Labs: CBC: Recent Labs  Lab 05/09/23 1159 05/10/23 0354  WBC 5.9 6.5  NEUTROABS 4.1 4.3  HGB 13.0 11.9*  HCT 40.9 37.7  MCV 93.4 95.4  PLT 240 228   Basic Metabolic Panel: Recent Labs  Lab 05/09/23 1159 05/10/23 0354  NA 136 138  K 3.3* 3.5  CL 101 102  CO2 21* 24  GLUCOSE 211* 166*  BUN 8 7*  CREATININE 0.60 0.55  CALCIUM 9.5 9.4   Liver Function Tests: Recent Labs  Lab 05/09/23 1159  AST 40  ALT 27  ALKPHOS 51  BILITOT 0.8  PROT 7.5  ALBUMIN 3.7   CBG: No results for input(s): "GLUCAP" in the last 168 hours.  Discharge time spent: {LESS  THAN/GREATER HYQM:57846} 30 minutes.  Signed: Marcelino Duster, MD [facility] 05/10/2023

## 2023-05-10 NOTE — Care Management CC44 (Signed)
Condition Code 44 Documentation Completed  Patient Details  Name: Lisa Gutierrez MRN: [id number] Date of Birth: 09-10-1950   Condition Code 44 given:  Yes Patient signature on Condition Code 44 notice:  Yes Documentation of 2 MD's agreement:  Yes Code 44 added to claim:  Yes    Margarito Liner, LCSW 05/10/2023, 11:45 AM

## 2023-05-10 NOTE — ED Notes (Signed)
Called to room. Assisted patient getting up to use bathroom. Crackers and peanut butter provided. Pt denies any other needs. CB remains within reach. Pt encouraged to alert staff to any needs.

## 2023-05-10 NOTE — ED Notes (Signed)
Patient given a breakfast tray.

## 2023-05-10 NOTE — ED Notes (Signed)
Called to room. Pt assisted up to bathroom and back to bed without incident. Cb remains within reach. Bed changed due to patient spilling water. Encouraged to alert staff to any other needs.

## 2023-05-10 NOTE — ED Notes (Signed)
Pt reports nausea, up to bedside toilet. No PRN orders for nausea noted. Secure chat sent to Jon Billings, NP for orders.

## 2023-05-10 NOTE — ED Notes (Signed)
Pt agreeable to putting bed rail up to ensure she doesn't fall off the bed if she falls asleep. CB remains within reach. No further needs voiced at this time.

## 2023-05-10 NOTE — ED Notes (Signed)
Patient unhooked from the monitor, and ambulatory to the bathroom.

## 2023-05-10 NOTE — ED Notes (Signed)
Patient currently eating lunch, and to be discharged on completion.

## 2023-05-10 NOTE — Care Management Obs Status (Signed)
MEDICARE OBSERVATION STATUS NOTIFICATION   Patient Details  Name: Lisa Gutierrez MRN: [id number] Date of Birth: 1950-12-13   Medicare Observation Status Notification Given:  Yes    Margarito Liner, LCSW 05/10/2023, 11:45 AM

## 2023-05-11 DIAGNOSIS — J121 Respiratory syncytial virus pneumonia: Secondary | ICD-10-CM | POA: Insufficient documentation

## 2023-05-12 ENCOUNTER — Telehealth: Payer: Self-pay | Admitting: Cardiology

## 2023-05-12 LAB — CULTURE, RESPIRATORY W GRAM STAIN

## 2023-05-12 LAB — LEGIONELLA PNEUMOPHILA SEROGP 1 UR AG: L. pneumophila Serogp 1 Ur Ag: NEGATIVE

## 2023-05-12 NOTE — Telephone Encounter (Signed)
Patient called in reporting she had an afib ablation 1/16 and developed cough/congestion 2 days afterwards. Most recently admitted to [facility] with sepsis/RSV and discharged home on 1/24. She received IVFs during that admission and reports her legs are swollen, still with cough but unchanged from her hospital admission. Asking if it would be ok for her to use her PRN lasix 20mg  daily. I advised this was fine, and could even use for the next 2 days to help with edema. Reports raised fluid filled areas on her legs as well. No redness or pain at these sites. Advised to monitor these sites to ensure no concerns for infection. She voiced understanding and thanked me for callback.

## 2023-05-13 ENCOUNTER — Encounter: Payer: Self-pay | Admitting: *Deleted

## 2023-05-13 ENCOUNTER — Encounter: Payer: Self-pay | Admitting: Family Medicine

## 2023-05-14 ENCOUNTER — Encounter: Payer: Self-pay | Admitting: Family Medicine

## 2023-05-14 ENCOUNTER — Other Ambulatory Visit: Payer: Self-pay | Admitting: Family Medicine

## 2023-05-14 ENCOUNTER — Telehealth: Payer: PPO | Admitting: Family Medicine

## 2023-05-14 VITALS — Ht 65.0 in | Wt 216.0 lb

## 2023-05-14 DIAGNOSIS — J121 Respiratory syncytial virus pneumonia: Secondary | ICD-10-CM

## 2023-05-14 DIAGNOSIS — R0981 Nasal congestion: Secondary | ICD-10-CM

## 2023-05-14 DIAGNOSIS — K581 Irritable bowel syndrome with constipation: Secondary | ICD-10-CM

## 2023-05-14 LAB — CULTURE, BLOOD (ROUTINE X 2)
Culture: NO GROWTH
Culture: NO GROWTH

## 2023-05-14 MED ORDER — LINZESS 145 MCG PO CAPS: 145.0000 ug | ORAL_CAPSULE | Freq: Every day | ORAL | 2 refills | Status: DC

## 2023-05-14 MED ORDER — FLUTICASONE PROPIONATE 50 MCG/ACT NA SUSP: 2.0000 | Freq: Every day | NASAL | 6 refills | Status: DC

## 2023-05-14 NOTE — Telephone Encounter (Signed)
Copied from CRM [id number]. Topic: Clinical - Medication Refill >> May 14, 2023  9:28 AM Desma Mcgregor wrote: Most Recent Primary Care Visit:  Provider: [name] A  Department: [facility]  Visit Type: OFFICE VISIT 20  Date: 05/09/2023  Medication: LINZESS 145 MCG CAPS capsule  Has the patient contacted their pharmacy? Yes. Says there is no refills on file, however I see 2 left.  Is this the correct pharmacy for this prescription? Yes If no, delete pharmacy and type the correct one.  This is the patient's preferred pharmacy:  [id number] [place], [place] - [address] [place] [address] Phone: [phone] Fax: [phone]  Has the prescription been filled recently? No  Is the patient out of the medication? Yes  Has the patient been seen for an appointment in the last year OR does the patient have an upcoming appointment? Yes  Can we respond through MyChart? Yes  Agent: Please be advised that Rx refills may take up to 3 business days. We ask that you follow-up with your pharmacy.

## 2023-05-14 NOTE — Progress Notes (Signed)
Virtual Visit via Video Note  I connected with Lisa Gutierrez on 05/14/23 at  1:30 PM EST by a video enabled telemedicine application and verified that I am speaking with the correct person using two identifiers.  Patient Location: Home Provider Location: Office/clinic  I discussed the limitations, risks, security, and privacy concerns of performing an evaluation and management service by video and the availability of in person appointments. I also discussed with the patient that there may be a patient responsible charge related to this service. The patient expressed understanding and agreed to proceed.    Subjective   Patient ID: Lisa Gutierrez, female    DOB: 1951-01-14  Age: 73 y.o. MRN: [id number]  Chief Complaint  Patient presents with   Hospitalization Follow-up    HPI Lisa Gutierrez is a 73 y.o. female presenting today for follow up of ED visit on 05/09/2023.  She was hypoxic when she initially presented, chest x-ray demonstrated possible edema and infiltrate of left lung base.  Tested positive for RSV as well.  Lactate 3.4, was given a liter of fluid.  She was given Rocephin and azithromycin while in the ED.  Hospitalist was consulted and she was admitted with recommendations to continue Rocephin and azithromycin, Mucinex and DuoNebs as needed, supplemental oxygen, and telemetry monitoring.  Recommendation per hospitalist was inpatient admission.  Lactic acid improved, cough and shortness of breath improved so she was discharged home on 05/10/2023.  She was advised to continue her albuterol inhaler and Mucinex for cough.  She states that her breathing has gradually been improving, but she is still experiencing cough, nasal congestion, and headache.  She would like clarification on what she should be doing to improve her symptoms.  ROS Negative unless otherwise noted in HPI  Outpatient Medications Prior to Visit  Medication Sig   acetaminophen (TYLENOL) 500  MG tablet Take 1,000 mg by mouth every 8 (eight) hours as needed for mild pain (pain score 1-3), moderate pain (pain score 4-6) or headache.   ALPRAZolam (XANAX) 0.5 MG tablet Take 1 tablet (0.5 mg total) by mouth at bedtime as needed for anxiety or sleep. (Patient taking differently: Take 0.5 mg by mouth at bedtime.)   apixaban (ELIQUIS) 5 MG TABS tablet Take 1 tablet (5 mg total) by mouth 2 (two) times daily.   BLACK ELDERBERRY PO Take 1 tablet by mouth daily.   Blood Glucose Monitoring Suppl (ONE TOUCH ULTRA 2) w/Device KIT CHECK IN THE MORNING, AT NOON, AND AT BEDTIME. MAY SUBSTITUTE TO ANY MANUFACTURER COVERED BY PATIENT'S INSURANCE.   busPIRone (BUSPAR) 5 MG tablet Take 1 tablet (5 mg total) by mouth daily AND 2 tablets (10 mg total) every evening.   Calcium Carb-Cholecalciferol (CALCIUM 600/VITAMIN D3) 600-20 MG-MCG TABS Take 1 tablet by mouth 2 (two) times daily.   Cholecalciferol (VITAMIN D3) 50 MCG (2000 UT) capsule Take 2,000 Units by mouth daily.   clobetasol ointment (TEMOVATE) 0.05 % Apply topically 2 (two) times daily. Apply topically to affected twice daily until improved. (Patient taking differently: Apply 1 Application topically 2 (two) times daily as needed (progeria nodularis).)   digoxin (LANOXIN) 0.25 MG tablet Take 1 tablet (250 mcg total) by mouth daily.   Docusate Calcium (STOOL SOFTENER PO) Take 2 tablets by mouth at bedtime.   empagliflozin (JARDIANCE) 25 MG TABS tablet Take 1 tablet (25 mg total) by mouth daily before breakfast.   Flaxseed, Linseed, (FLAXSEED OIL) 1400 MG CAPS Take 1,400 mg by mouth 2 (two)  times daily.   furosemide (LASIX) 20 MG tablet TAKE 1 TABLET BY MOUTH EVERY DAY (Patient taking differently: Take 20 mg by mouth daily as needed for fluid or edema.)   Glucose Blood (BLOOD GLUCOSE TEST STRIPS) STRP 1 each by In Vitro route in the morning, at noon, and at bedtime. May substitute to any manufacturer covered by patient's insurance.   glucose blood test  strip Use as instructed   guaiFENesin (MUCINEX) 600 MG 12 hr tablet Take 1 tablet (600 mg total) by mouth 2 (two) times daily.   guaiFENesin (ROBITUSSIN) 100 MG/5ML liquid Take 5 mLs by mouth every 4 (four) hours as needed for cough or to loosen phlegm.   LINZESS 145 MCG CAPS capsule Take 1 capsule (145 mcg total) by mouth daily.   losartan (COZAAR) 25 MG tablet Take 1 tablet (25 mg total) by mouth daily.   metFORMIN (GLUCOPHAGE-XR) 500 MG 24 hr tablet TAKE 1 TABLET BY MOUTH 2 TIMES DAILY WITH A MEAL.   metoprolol tartrate (LOPRESSOR) 50 MG tablet TAKE 1.5 TABLETS BY MOUTH 2 TIMES DAILY.   Multiple Vitamins-Minerals (MULTIVITAMIN ADULT PO) Take 1 tablet by mouth daily. One a day   pantoprazole (PROTONIX) 40 MG tablet TAKE 1 TABLET BY MOUTH TWICE A DAY   RYBELSUS 7 MG TABS Take 7 mg by mouth daily.   venlafaxine XR (EFFEXOR-XR) 150 MG 24 hr capsule Take 1 capsule (150 mg total) by mouth daily with breakfast. Take total of 187.5 mg daily. Take along with 37.5 mg cap   venlafaxine XR (EFFEXOR-XR) 37.5 MG 24 hr capsule Take 1 capsule (37.5 mg total) by mouth daily with breakfast. Take total of 187.5 mg daily. Take along with 150 mg cap   montelukast (SINGULAIR) 10 MG tablet Take 1 tablet (10 mg total) by mouth at bedtime. (Patient not taking: Reported on 05/09/2023)   No facility-administered medications prior to visit.     Objective:     Physical Exam General: Speaking clearly in complete sentences without any shortness of breath.  Alert and oriented x3.  Normal judgment. No apparent acute distress.   Assessment & Plan:  Nasal congestion -     Fluticasone Propionate; Place 2 sprays into both nostrils daily.  Dispense: 16 g; Refill: 6  RSV (respiratory syncytial virus pneumonia)  Recommend to continue with symptomatic management.  Continue taking Mucinex, may take Tylenol as well.  Sending prescription for fluticasone nasal spray for nasal congestion.  Otherwise recommend increase hydration  and rest.  Patient verbalized understanding and is agreeable to this plan.  Return if symptoms worsen or fail to improve.   I discussed the assessment and treatment plan with the patient. The patient was provided an opportunity to ask questions, and all were answered. The patient agreed with the plan and demonstrated an understanding of the instructions.   The patient was advised to call back or seek an in-person evaluation if the symptoms worsen or if the condition fails to improve as anticipated.  The above assessment and management plan was discussed with the patient. The patient verbalized understanding of and has agreed to the management plan.   I spent 30 minutes on the day of the encounter to include pre-visit record review, virtual face-to-face time with the patient, and post visit documentation and medication ordering.   [name], PA

## 2023-05-15 ENCOUNTER — Telehealth: Payer: Self-pay | Admitting: *Deleted

## 2023-05-15 ENCOUNTER — Encounter: Payer: Self-pay | Admitting: Family Medicine

## 2023-05-15 ENCOUNTER — Ambulatory Visit: Payer: Self-pay | Admitting: *Deleted

## 2023-05-15 NOTE — Telephone Encounter (Signed)
Contacted pt to give her the below message.      Please contact patient to let her know that we received a letter from the [employer] patient assistance program stating that they need proof of income added to her application.

## 2023-05-15 NOTE — Patient Outreach (Signed)
  Care Coordination   05/15/2023 Name: Lisa Gutierrez MRN: [id number] DOB: 1950-07-17   Care Coordination Outreach Attempts:  An unsuccessful outreach was attempted for an appointment today.  Follow Up Plan:  Additional outreach attempts will be made to offer the patient complex care management information and services.   Encounter Outcome:  No Answer. Left HIPAA compliant VM.   Care Coordination Interventions:  No, not indicated    Demetrios Loll, RN, BSN [facility]  [facility], [facility] RN Care Manager Direct Dial: [phone]

## 2023-05-15 NOTE — Progress Notes (Signed)
Please contact patient to let her know that we received a letter from the [employer] patient assistance program stating that they need proof of income added to her application.

## 2023-05-16 ENCOUNTER — Encounter: Payer: Self-pay | Admitting: Family Medicine

## 2023-05-16 ENCOUNTER — Other Ambulatory Visit: Payer: Self-pay | Admitting: Pharmacist

## 2023-05-16 DIAGNOSIS — R051 Acute cough: Secondary | ICD-10-CM

## 2023-05-16 NOTE — Progress Notes (Signed)
   05/16/2023  Patient ID: Lisa Gutierrez, female   DOB: Oct 24, 1950, 73 y.o.   MRN: [id number]  Called and spoke with the patient on the phone today. She reports getting over a cold currently and having several of her medications switch around right now. Would prefer to hold off on discussing pre-packaged medications for another month. Is able to manage her medications with assistance of the pill box for now.  Appointment re-scheduled for 06/14/23.   Marlowe Aschoff, PharmD [facility] Phone Number: [phone]

## 2023-05-17 ENCOUNTER — Telehealth: Payer: Self-pay | Admitting: *Deleted

## 2023-05-17 MED ORDER — DEXTROMETHORPHAN HBR 15 MG PO CAPS: 15.0000 mg | ORAL_CAPSULE | ORAL | 0 refills | Status: DC | PRN

## 2023-05-17 NOTE — Progress Notes (Signed)
Complex Care Management Care Guide Note  05/17/2023 Name: Lisa Gutierrez MRN: [id number] DOB: [birth year]  Lisa Gutierrez is a 73 y.o. year old female who is a primary care patient of [name], [place] and is actively engaged with the care management team. I reached out to [name] by phone today to assist with re-scheduling  with the RN Case Manager.  Follow up plan: Unsuccessful telephone outreach attempt made. A HIPAA compliant phone message was left for the patient providing contact information and requesting a return call.  Gwenevere Ghazi  [facility]  [facility], [facility] Guide  Direct Dial: [phone]  Fax [phone]

## 2023-05-18 NOTE — Progress Notes (Deleted)
 [facility] MD/PA/NP OP Progress Note  05/18/2023 9:00 AM Lisa Gutierrez  MRN:  [id number]  Chief Complaint: No chief complaint on file.  HPI:  - since the last visit, she was admitted due to sepsis secondary to pneumonia. RSV positive.  - she underwent Afib ablation.   Daily routine: takes care of her dogs, cats, house chores Exercise: unable to do due to knee pain Employment:  Support: none (talks with her oldest granddaughter, who has left to college) Household: husband Marital status: married for 52 years  Number of children: 2 sons.   Visit Diagnosis: No diagnosis found.  Past Psychiatric History: Please see initial evaluation for full details. I have reviewed the history. No updates at this time.     Past Medical History:  Past Medical History:  Diagnosis Date   Anxiety    Atrial fibrillation (HCC)    Depression    Depression    Phreesia 07/02/2020   Diabetes mellitus without complication (HCC)    GERD (gastroesophageal reflux disease)    Hypertension    IBS (irritable bowel syndrome)    Neuromuscular disorder (HCC)     Past Surgical History:  Procedure Laterality Date   ATRIAL FIBRILLATION ABLATION N/A 05/02/2023   Procedure: ATRIAL FIBRILLATION ABLATION;  Surgeon: Cindie Ole DASEN, MD;  Location: [facility];  Service: Cardiovascular;  Laterality: N/A;   BREAST EXCISIONAL BIOPSY     CARDIOVERSION N/A 09/03/2022   Procedure: CARDIOVERSION;  Surgeon: Darron Deatrice LABOR, MD;  Location: [facility];  Service: Cardiovascular;  Laterality: N/A;   CATARACT EXTRACTION W/PHACO Right 07/24/2022   Procedure: CATARACT EXTRACTION PHACO AND INTRAOCULAR LENS PLACEMENT (IOC) RIGHT DIABETIC  5.93  00:42.5;  Surgeon: Jaye Fallow, MD;  Location: [facility];  Service: Ophthalmology;  Laterality: Right;   CATARACT EXTRACTION W/PHACO Left 08/07/2022   Procedure: CATARACT EXTRACTION PHACO AND INTRAOCULAR LENS PLACEMENT (IOC) LEFT DIABETIC  12.33  00:58.3;  Surgeon:  Jaye Fallow, MD;  Location: [facility];  Service: Ophthalmology;  Laterality: Left;   CESAREAN SECTION N/A    Phreesia 07/02/2020   CHOLECYSTECTOMY     EXCISION / BIOPSY BREAST / NIPPLE / DUCT Right 1973   duct removed   SPINE SURGERY N/A    Phreesia 07/02/2020   TUBAL LIGATION N/A    Phreesia 07/02/2020    Family Psychiatric History: Please see initial evaluation for full details. I have reviewed the history. No updates at this time.     Family History:  Family History  Problem Relation Age of Onset   Diabetes Mother    Hypertension Mother    Coronary artery disease Father    Glaucoma Father    Breast cancer Neg Hx     Social History:  Social History   Socioeconomic History   Marital status: Married    Spouse name: Not on file   Number of children: Not on file   Years of education: Not on file   Highest education level: Not on file  Occupational History   Not on file  Tobacco Use   Smoking status: Former    Passive exposure: Past   Smokeless tobacco: Never  Vaping Use   Vaping status: Never Used  Substance and Sexual Activity   Alcohol use: No    Alcohol/week: 0.0 standard drinks of alcohol   Drug use: Never   Sexual activity: Not Currently    Partners: Male  Other Topics Concern   Not on file  Social History Narrative  Not on file   Social Drivers of Health   Financial Resource Strain: Low Risk  (10/15/2022)   Overall Financial Resource Strain (CARDIA)    Difficulty of Paying Living Expenses: Not very hard  Food Insecurity: No Food Insecurity (10/15/2022)   Hunger Vital Sign    Worried About Running Out of Food in the Last Year: Never true    Ran Out of Food in the Last Year: Never true  Transportation Needs: No Transportation Needs (08/09/2022)   PRAPARE - Administrator, Civil Service (Medical): No    Lack of Transportation (Non-Medical): No  Physical Activity: Inactive (08/09/2022)   Exercise Vital Sign    Days of Exercise  per Week: 0 days    Minutes of Exercise per Session: 0 min  Stress: Stress Concern Present (10/15/2022)   [employer] of Occupational Health - Occupational Stress Questionnaire    Feeling of Stress : Rather much  Social Connections: Socially Integrated (08/09/2022)   Social Connection and Isolation Panel [NHANES]    Frequency of Communication with Friends and Family: More than three times a week    Frequency of Social Gatherings with Friends and Family: More than three times a week    Attends Religious Services: More than 4 times per year    Active Member of [employer] or Organizations: Yes    Attends Engineer, Structural: More than 4 times per year    Marital Status: Married    Allergies:  Allergies  Allergen Reactions   Aspirin Other (See Comments)    GI upset   Ibuprofen Other (See Comments)    GERD   Levofloxacin Other (See Comments)    Weight gain   Meloxicam Other (See Comments)    GI upset   Morphine Other (See Comments)   Naprosyn  [Naproxen] Other (See Comments)    GI upset   Nsaids Other (See Comments)    GI upset   Ozempic  (0.25 Or 0.5 Mg-Dose) [Semaglutide (0.25 Or 0.5mg -Dos)] Nausea And Vomiting   Propofol  Other (See Comments)    Had horrible nightmares   Quinolones    Robinul  [Glycopyrrolate] Nausea And Vomiting   Penicillin V Potassium Rash   Sulfa Antibiotics Rash    Metabolic Disorder Labs: Lab Results  Component Value Date   HGBA1C 9.2 (H) 03/26/2023   MPG 169 07/10/2022   No results found for: PROLACTIN Lab Results  Component Value Date   CHOL 163 03/26/2023   TRIG 340 (H) 03/26/2023   HDL 24 (L) 03/26/2023   CHOLHDL 6.8 (H) 03/26/2023   LDLCALC 83 03/26/2023   LDLCALC 123 (H) 08/24/2021   Lab Results  Component Value Date   TSH 0.814 03/26/2023   TSH 0.908 07/10/2022    Therapeutic Level Labs: No results found for: LITHIUM No results found for: VALPROATE No results found for: CBMZ  Current Medications: Current  Outpatient Medications  Medication Sig Dispense Refill   acetaminophen  (TYLENOL ) 500 MG tablet Take 1,000 mg by mouth every 8 (eight) hours as needed for mild pain (pain score 1-3), moderate pain (pain score 4-6) or headache.     ALPRAZolam  (XANAX ) 0.5 MG tablet Take 1 tablet (0.5 mg total) by mouth at bedtime as needed for anxiety or sleep. (Patient taking differently: Take 0.5 mg by mouth at bedtime.) 30 tablet 2   apixaban  (ELIQUIS ) 5 MG TABS tablet Take 1 tablet (5 mg total) by mouth 2 (two) times daily. 60 tablet 0   BLACK ELDERBERRY PO Take 1  tablet by mouth daily.     Blood Glucose Monitoring Suppl (ONE TOUCH ULTRA 2) w/Device KIT CHECK IN THE MORNING, AT NOON, AND AT BEDTIME. MAY SUBSTITUTE TO ANY MANUFACTURER COVERED BY PATIENT'S INSURANCE. 300 kit 3   busPIRone  (BUSPAR ) 5 MG tablet Take 1 tablet (5 mg total) by mouth daily AND 2 tablets (10 mg total) every evening. 90 tablet 1   Calcium  Carb-Cholecalciferol  (CALCIUM  600/VITAMIN D3) 600-20 MG-MCG TABS Take 1 tablet by mouth 2 (two) times daily.     Cholecalciferol  (VITAMIN D3) 50 MCG (2000 UT) capsule Take 2,000 Units by mouth daily.     clobetasol  ointment (TEMOVATE ) 0.05 % Apply topically 2 (two) times daily. Apply topically to affected twice daily until improved. (Patient taking differently: Apply 1 Application topically 2 (two) times daily as needed (progeria nodularis).) 30 g 2   Dextromethorphan  HBr (ROBITUSSIN LINGERING COUGHGELS) 15 MG CAPS Take 1 capsule (15 mg total) by mouth every 4 (four) hours as needed (cough). 28 capsule 0   digoxin  (LANOXIN ) 0.25 MG tablet Take 1 tablet (250 mcg total) by mouth daily. 90 tablet 0   Docusate Calcium  (STOOL SOFTENER PO) Take 2 tablets by mouth at bedtime.     empagliflozin  (JARDIANCE ) 25 MG TABS tablet Take 1 tablet (25 mg total) by mouth daily before breakfast. 90 tablet 1   Flaxseed, Linseed, (FLAXSEED OIL) 1400 MG CAPS Take 1,400 mg by mouth 2 (two) times daily.     fluticasone  (FLONASE ) 50  MCG/ACT nasal spray Place 2 sprays into both nostrils daily. 16 g 6   furosemide  (LASIX ) 20 MG tablet TAKE 1 TABLET BY MOUTH EVERY DAY (Patient taking differently: Take 20 mg by mouth daily as needed for fluid or edema.) 30 tablet 1   Glucose Blood (BLOOD GLUCOSE TEST STRIPS) STRP 1 each by In Vitro route in the morning, at noon, and at bedtime. May substitute to any manufacturer covered by patient's insurance. 100 strip 9   glucose blood test strip Use as instructed 100 each 12   guaiFENesin  (MUCINEX ) 600 MG 12 hr tablet Take 1 tablet (600 mg total) by mouth 2 (two) times daily. 60 tablet 2   guaiFENesin  (ROBITUSSIN) 100 MG/5ML liquid Take 5 mLs by mouth every 4 (four) hours as needed for cough or to loosen phlegm. 120 mL 0   LINZESS  145 MCG CAPS capsule Take 1 capsule (145 mcg total) by mouth daily. 30 capsule 2   losartan  (COZAAR ) 25 MG tablet Take 1 tablet (25 mg total) by mouth daily. 90 tablet 3   metFORMIN  (GLUCOPHAGE -XR) 500 MG 24 hr tablet TAKE 1 TABLET BY MOUTH 2 TIMES DAILY WITH A MEAL. 60 tablet 1   metoprolol  tartrate (LOPRESSOR ) 50 MG tablet TAKE 1.5 TABLETS BY MOUTH 2 TIMES DAILY. 90 tablet 0   montelukast  (SINGULAIR ) 10 MG tablet Take 1 tablet (10 mg total) by mouth at bedtime. (Patient not taking: Reported on 05/09/2023) 90 tablet 3   Multiple Vitamins-Minerals (MULTIVITAMIN ADULT PO) Take 1 tablet by mouth daily. One a day     pantoprazole  (PROTONIX ) 40 MG tablet TAKE 1 TABLET BY MOUTH TWICE A DAY 180 tablet 1   RYBELSUS  7 MG TABS Take 7 mg by mouth daily.     venlafaxine  XR (EFFEXOR -XR) 150 MG 24 hr capsule Take 1 capsule (150 mg total) by mouth daily with breakfast. Take total of 187.5 mg daily. Take along with 37.5 mg cap 90 capsule 0   venlafaxine  XR (EFFEXOR -XR) 37.5 MG 24 hr capsule  Take 1 capsule (37.5 mg total) by mouth daily with breakfast. Take total of 187.5 mg daily. Take along with 150 mg cap 90 capsule 0   No current facility-administered medications for this visit.      Musculoskeletal: Strength & Muscle Tone:  N/A Gait & Station:  N/A Patient leans: N/A  Psychiatric Specialty Exam: Review of Systems  There were no vitals taken for this visit.There is no height or weight on file to calculate BMI.  General Appearance: {Appearance:22683}  Eye Contact:  {BHH EYE CONTACT:22684}  Speech:  Clear and Coherent  Volume:  Normal  Mood:  {BHH MOOD:22306}  Affect:  {Affect (PAA):22687}  Thought Process:  Coherent  Orientation:  Full (Time, Place, and Person)  Thought Content: Logical   Suicidal Thoughts:  {ST/HT (PAA):22692}  Homicidal Thoughts:  {ST/HT (PAA):22692}  Memory:  Immediate;   Good  Judgement:  {Judgement (PAA):22694}  Insight:  {Insight (PAA):22695}  Psychomotor Activity:  Normal  Concentration:  Concentration: Good and Attention Span: Good  Recall:  Good  Fund of Knowledge: Good  Language: Good  Akathisia:  No  Handed:  Right  AIMS (if indicated): not done  Assets:  Communication Skills Desire for Improvement  ADL's:  Intact  Cognition: WNL  Sleep:  {BHH GOOD/FAIR/POOR:22877}   Screenings: GAD-7    Flowsheet Row Office Visit from 05/09/2023 in [facility] at [facility] Visit from 12/27/2022 in [facility] at [facility] Video Visit from 11/08/2022 in [facility] at [facility] Visit from 09/25/2022 in [facility] Care at [facility] Erroneous Encounter from 07/18/2022 in [facility] Care at [facility]  Total GAD-7 Score 15 17 15 12 13       Mini-Mental    Flowsheet Row Clinical Support from 02/12/2020 in [facility] Clinical Support from 02/06/2018 in [facility]  Total Score (max 30 points ) 29 30      PHQ2-9    Flowsheet Row Office Visit from 05/09/2023 in [facility] at [facility] Office Visit from 12/27/2022 in [facility] Care at [facility] Video Visit from 11/08/2022 in [facility] Primary Care  at [facility] Coordination from 10/15/2022 in [facility] Office Visit from 09/25/2022 in [facility] at [facility]  PHQ-2 Total Score 4 6 6 5 5   PHQ-9 Total Score 14 24 22 19 19       Flowsheet Row ED from 05/09/2023 in [facility] Emergency Department at [facility] Admission (Discharged) from 05/02/2023 in [facility] Admission (Discharged) from 08/07/2022 in [facility] SURGICAL CENTER PERIOP  C-SSRS RISK CATEGORY No Risk No Risk No Risk        Assessment and Plan:  Lisa Gutierrez is a 73 y.o. year old female with a history of  depression, hypertension, diabetes, GERD, newly diagnosed Afib with RVR, who presents for follow up appointment for below.    1. MDD (major depressive disorder), recurrent episode, moderate (HCC) 2. GAD (generalized anxiety disorder) Acute stressors include: pending cataract surgery, fall  Other stressors include: marital conflict, conflict with her children, financial strain  History:     Exam is notable for tearful affect, and she reports worsening in depressive symptoms, anxiety in the setting of feeling loneliness while waiting for upcoming ablation.  Will uptitrate buspirone  to optimize treatment for anxiety.  Will titrate the dose at  night given she reports good benefit for insomnia.  She was advised to contact the office if she experiences any worsening in dizziness.  Will continue venlafaxine  to target depression and anxiety.  She is willing to consider therapy referral after checking with her insurance company.    # insomnia - sleep eval long time ago, reportedly normal.  Overall improved since starting Buspar .  She complains of middle insomnia and daytime fatigue. Although she was strongly encouraged to consider a sleep evaluation, she declined at this.  Will make the above intervention as outlined above.    Plan Continue venlafaxine  187.5 mg  daily- monitor weight gain Increase Buspar  5 mg daily, 10 mg in PM - monitor drowsiness Next appointment : 2/5 at 3 pm, video.  She declined in person visit due to her physical condition   Past trials of medication: sertraline, citalopram, lexapro , fluoxetine , bupropion  (headache)   The patient demonstrates the following risk factors for suicide: Chronic risk factors for suicide include: psychiatric disorder of depression and chronic pain. Acute risk factors for suicide include: family or marital conflict. Protective factors for this patient include: hope for the future. Considering these factors, the overall suicide risk at this point appe    Collaboration of Care: Collaboration of Care: [facility] OP Collaboration of Rjmz:[id number]}  Patient/Guardian was advised Release of Information must be obtained prior to any record release in order to collaborate their care with an outside provider. Patient/Guardian was advised if they have not already done so to contact the registration department to sign all necessary forms in order for us  to release information regarding their care.   Consent: Patient/Guardian gives verbal consent for treatment and assignment of benefits for services provided during this visit. Patient/Guardian expressed understanding and agreed to proceed.    Katheren Sleet, MD 05/18/2023, 9:00 AM

## 2023-05-19 ENCOUNTER — Other Ambulatory Visit: Payer: Self-pay | Admitting: Family Medicine

## 2023-05-19 DIAGNOSIS — E1165 Type 2 diabetes mellitus with hyperglycemia: Secondary | ICD-10-CM

## 2023-05-22 ENCOUNTER — Telehealth: Payer: Self-pay | Admitting: Psychiatry

## 2023-05-23 ENCOUNTER — Encounter: Payer: Self-pay | Admitting: Family Medicine

## 2023-05-24 NOTE — Progress Notes (Signed)
 Complex Care Management Care Guide Note  05/24/2023 Name: Lisa Gutierrez MRN: [id number] DOB: 15-Mar-1951  [name] is a 73 y.o. year old female who is a primary care patient of Wallace Joesph LABOR, [place] and is actively engaged with the care management team. I reached out to [name] by phone today to assist with re-scheduling  with the RN Case Manager.  Follow up plan: Unsuccessful telephone outreach attempt made. A HIPAA compliant phone message was left for the patient providing contact information and requesting a return call.  [name]  [facility], [facility] Guide  Direct Dial: [phone]  Fax [phone]
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
## 2023-05-30 ENCOUNTER — Encounter: Payer: Self-pay | Admitting: Cardiology

## 2023-05-30 ENCOUNTER — Telehealth: Payer: Self-pay | Admitting: Cardiology

## 2023-05-30 ENCOUNTER — Ambulatory Visit: Payer: PPO | Attending: Cardiology | Admitting: Cardiology

## 2023-05-30 VITALS — BP 130/78 | HR 108 | Ht 66.0 in | Wt 210.0 lb

## 2023-05-30 DIAGNOSIS — F419 Anxiety disorder, unspecified: Secondary | ICD-10-CM

## 2023-05-30 DIAGNOSIS — I480 Paroxysmal atrial fibrillation: Secondary | ICD-10-CM | POA: Diagnosis not present

## 2023-05-30 DIAGNOSIS — D6869 Other thrombophilia: Secondary | ICD-10-CM | POA: Diagnosis not present

## 2023-05-30 NOTE — Telephone Encounter (Signed)
Per chart review, cardioversion instructions were sent via MyChart. The patient was made aware and confirmed that she is able to access MyChart to review the instructions

## 2023-05-30 NOTE — Patient Instructions (Addendum)
Medication Instructions:  The current medical regimen is effective;  continue present plan and medications.  *If you need a refill on your cardiac medications before your next appointment, please call your pharmacy*   Lab Work: Your provider would like for you to have following labs drawn today CBC, BMET, MAG.   If you have labs (blood work) drawn today and your tests are completely normal, you will receive your results only by: MyChart Message (if you have MyChart) OR A paper copy in the mail If you have any lab test that is abnormal or we need to change your treatment, we will call you to review the results.   Testing/Procedures: Your physician has requested that you have a Cardioversion. Electrical Cardioversion uses a jolt of electricity to your heart either through paddles or wired patches attached to your chest. This is a controlled, usually prescheduled, procedure. This procedure is done at the hospital and you are not awake during the procedure. You usually go home the day of the procedure. Please see the instruction sheet given to you today for more information.    Follow-Up: At [facility], you and your health needs are our priority.  As part of our continuing mission to provide you with exceptional heart care, we have created designated Provider Care Teams.  These Care Teams include your primary Cardiologist (physician) and Advanced Practice Providers (APPs -  Physician Assistants and Nurse Practitioners) who all work together to provide you with the care you need, when you need it.  We recommend signing up for the patient portal called "MyChart".  Sign up information is provided on this After Visit Summary.  MyChart is used to connect with patients for Virtual Visits (Telemedicine).  Patients are able to view lab/test results, encounter notes, upcoming appointments, etc.  Non-urgent messages can be sent to your provider as well.   To learn more about what you can do with  MyChart, go to [email or web address].    Your next appointment:   1 week(s) post DCCV   Provider:   Sherie Don, NP    Other Instructions      Dear Lisa Gutierrez  You are scheduled for a Cardioversion on Monday, February 17 with Dr. Azucena Cecil.  Please arrive at the [facility] Entrance of [facility], [address], [place] [address] at 12:00 PM (This is 1 hour(s) prior to your procedure time).  Proceed to the Check-In Desk directly inside the entrance.  Procedure Parking: Use the entrance off of the [address] side of the hospital. Turn right upon entering and follow the driveway to parking that is directly in front of the [facility] Center. There is no valet parking available at this entrance, however there is an awning directly in front of the [facility]ascular Center for drop off/ pick up for patients.    DIET:  Nothing to eat or drink after midnight except a sip of water with medications (see medication instructions below)  MEDICATION INSTRUCTIONS: !!IF ANY NEW MEDICATIONS ARE STARTED AFTER TODAY, PLEASE NOTIFY YOUR PROVIDER AS SOON AS POSSIBLE!!  FYI: Medications such as Semaglutide (Ozempic, [place]), Tirzepatide (Mounjaro, Zepbound), Dulaglutide (Trulicity), etc ("GLP1 agonists") AND Canagliflozin (Invokana), Dapagliflozin (Farxiga), Empagliflozin (Jardiance), Ertugliflozin (Steglatro), Bexagliflozin [employer]) or any combination with one of these drugs such as Invokamet (Canagliflozin/Metformin), Synjardy (Empagliflozin/Metformin), etc ("SGLT2 inhibitors") must be held around the time of a procedure. This is not a comprehensive list of all of these drugs. Please review all of  your medications and talk to your provider if you take any one of these. If you are not sure, ask your provider.  HOLD: Semaglutide (Ozempic, Rybelsus, [place]) for 1 day prior to the procedure. Last dose on Saturday, February 15 HOLD: Empagliflozin [place]) for 3 days prior  to the procedure. Last dose on Thursday, February 13.  Hold: Metformin the morning of procedure. Continue taking your anticoagulant (blood thinner): Apixaban (Eliquis).  You will need to continue this after your procedure until you are told by your provider that it is safe to stop.    LABS: CBC, BMET, MAG today-   FYI:  For your safety, and to allow [place] to monitor your vital signs accurately during the surgery/procedure we request: If you have artificial nails, gel coating, SNS etc, please have those removed prior to your surgery/procedure. Not having the nail coverings /polish removed may result in cancellation or delay of your surgery/procedure.  Your support person will be asked to wait in the waiting room during your procedure.  It is OK to have someone drop you off and come back when you are ready to be discharged.  You cannot drive after the procedure and will need someone to drive you home.  Bring your insurance cards.  *Special Note: Every effort is made to have your procedure done on time. Occasionally there are emergencies that occur at the hospital that may cause delays. Please be patient if a delay does occur.

## 2023-05-30 NOTE — Telephone Encounter (Signed)
New Message:      Patient was here today to see Lisa Gutierrez. She left all of her papers in the lab. She wants to know if her instructions for her Cardioversion on Monday(06-03-23) were in those papers If so, she will need a copy of those instructions please.

## 2023-05-31 ENCOUNTER — Other Ambulatory Visit: Payer: Self-pay | Admitting: Psychiatry

## 2023-05-31 DIAGNOSIS — H47323 Drusen of optic disc, bilateral: Secondary | ICD-10-CM | POA: Diagnosis not present

## 2023-05-31 DIAGNOSIS — H35379 Puckering of macula, unspecified eye: Secondary | ICD-10-CM | POA: Diagnosis not present

## 2023-05-31 DIAGNOSIS — Z961 Presence of intraocular lens: Secondary | ICD-10-CM | POA: Diagnosis not present

## 2023-05-31 DIAGNOSIS — E119 Type 2 diabetes mellitus without complications: Secondary | ICD-10-CM | POA: Diagnosis not present

## 2023-05-31 LAB — BASIC METABOLIC PANEL
BUN/Creatinine Ratio: 12 (ref 12–28)
BUN: 8 mg/dL (ref 8–27)
CO2: 20 mmol/L (ref 20–29)
Calcium: 10.1 mg/dL (ref 8.7–10.3)
Chloride: 99 mmol/L (ref 96–106)
Creatinine, Ser: 0.66 mg/dL (ref 0.57–1.00)
Glucose: 147 mg/dL — ABNORMAL HIGH (ref 70–99)
Potassium: 4.4 mmol/L (ref 3.5–5.2)
Sodium: 137 mmol/L (ref 134–144)
eGFR: 93 mL/min/{1.73_m2} (ref >59)

## 2023-05-31 LAB — CBC
Hematocrit: 46.5 % (ref 34.0–46.6)
Hemoglobin: 14.9 g/dL (ref 11.1–15.9)
MCH: 29.4 pg (ref 26.6–33.0)
MCHC: 32 g/dL (ref 31.5–35.7)
MCV: 92 fL (ref 79–97)
Platelets: 238 10*3/uL (ref 150–450)
RBC: 5.07 x10E6/uL (ref 3.77–5.28)
RDW: 13.3 % (ref 11.7–15.4)
WBC: 8.3 10*3/uL (ref 3.4–10.8)

## 2023-05-31 LAB — MAGNESIUM: Magnesium: 1.9 mg/dL (ref 1.6–2.3)

## 2023-05-31 NOTE — Progress Notes (Signed)
Complex Care Management Care Guide Note  05/31/2023 Name: Lisa Gutierrez MRN: [id number] DOB: October 12, 1950  Lisa Gutierrez is a 73 y.o. year old female who is a primary care patient of [name], [place] and is actively engaged with the care management team. I reached out to [name] by phone today to assist with re-scheduling  with the RN Case Manager.  Follow up plan: Telephone appointment with complex care management team member scheduled for:  2/27  Gwenevere Ghazi  [facility]  [facility], [facility] Guide  Direct Dial: [phone]  Fax [phone]

## 2023-06-03 ENCOUNTER — Ambulatory Visit: Payer: PPO | Admitting: Anesthesiology

## 2023-06-03 ENCOUNTER — Encounter
Admission: RE | Disposition: A | Discharge status: Home / Self Care | Payer: Self-pay | Source: Home / Self Care | Attending: Cardiology

## 2023-06-03 ENCOUNTER — Encounter: Payer: Self-pay | Admitting: Cardiology

## 2023-06-03 ENCOUNTER — Ambulatory Visit
Admission: RE | Admit: 2023-06-03 | Discharge: 2023-06-03 | Disposition: A | Discharge status: Home / Self Care | Payer: PPO | Attending: Cardiology | Admitting: Cardiology

## 2023-06-03 DIAGNOSIS — F419 Anxiety disorder, unspecified: Secondary | ICD-10-CM | POA: Insufficient documentation

## 2023-06-03 DIAGNOSIS — I4819 Other persistent atrial fibrillation: Secondary | ICD-10-CM

## 2023-06-03 DIAGNOSIS — I480 Paroxysmal atrial fibrillation: Secondary | ICD-10-CM | POA: Diagnosis not present

## 2023-06-03 DIAGNOSIS — I4891 Unspecified atrial fibrillation: Secondary | ICD-10-CM | POA: Diagnosis not present

## 2023-06-03 DIAGNOSIS — Z87891 Personal history of nicotine dependence: Secondary | ICD-10-CM | POA: Diagnosis not present

## 2023-06-03 DIAGNOSIS — Z7901 Long term (current) use of anticoagulants: Secondary | ICD-10-CM | POA: Insufficient documentation

## 2023-06-03 DIAGNOSIS — K589 Irritable bowel syndrome without diarrhea: Secondary | ICD-10-CM | POA: Insufficient documentation

## 2023-06-03 DIAGNOSIS — I10 Essential (primary) hypertension: Secondary | ICD-10-CM | POA: Insufficient documentation

## 2023-06-03 DIAGNOSIS — E119 Type 2 diabetes mellitus without complications: Secondary | ICD-10-CM | POA: Diagnosis not present

## 2023-06-03 HISTORY — PX: CARDIOVERSION: SHX1299

## 2023-06-03 LAB — GLUCOSE, CAPILLARY: Glucose-Capillary: 150 mg/dL — ABNORMAL HIGH (ref 70–99)

## 2023-06-03 SURGERY — CARDIOVERSION
Anesthesia: General

## 2023-06-03 MED ORDER — SODIUM CHLORIDE 0.9 % IV SOLN: INTRAVENOUS | Status: DC

## 2023-06-03 MED ORDER — PROPOFOL 10 MG/ML IV BOLUS
INTRAVENOUS | Status: DC | PRN
  Administered 2023-06-03: 50 mg via INTRAVENOUS
  Administered 2023-06-03: 10 mg via INTRAVENOUS

## 2023-06-03 NOTE — Procedures (Signed)
 Cardioversion procedure note For atrial fibrillation.  Procedure Details:  Consent: Risks of procedure as well as the alternatives and risks of each were explained to the (patient/caregiver).  Consent for procedure obtained.  Time Out: Verified patient identification, verified procedure, site/side was marked, verified correct patient position, special equipment/implants available, medications/allergies/relevent history reviewed, required imaging and test results available.  Performed  Patient placed on cardiac monitor, pulse oximetry, supplemental oxygen as necessary.   Sedation given: propofol IV per anesthesia team  Pacer pads placed anterior and posterior chest.   Cardioverted 1 time(s).   Cardioverted at  200J. Synchronized biphasic Converted to NSR   Evaluation: Findings: Post procedure EKG shows: NSR Complications: None Patient did tolerate procedure well.  Time Spent Directly with the Patient:  25 minutes   Debbe Odea, M.D.

## 2023-06-03 NOTE — Interval H&P Note (Signed)
History and Physical Interval Note:  06/03/2023 1:42 PM  Lisa Gutierrez  has presented today for surgery, with the diagnosis of persistent Afib.  The various methods of treatment have been discussed with the patient and family. After consideration of risks, benefits and other options for treatment, the patient has consented to  Procedure(s): CARDIOVERSION (N/A) as a surgical intervention.  The patient's history has been reviewed, patient examined, no change in status, stable for surgery.  I have reviewed the patient's chart and labs.  Questions were answered to the patient's satisfaction.     [name]

## 2023-06-03 NOTE — Transfer of Care (Signed)
Immediate Anesthesia Transfer of Care Note  Patient: Lisa Gutierrez  Procedure(s) Performed: CARDIOVERSION  Patient Location: PACU  Anesthesia Type:General  Level of Consciousness: awake, alert , and oriented  Airway & Oxygen Therapy: Patient Spontanous Breathing and Patient connected to nasal cannula oxygen  Post-op Assessment: Report given to RN, Post -op Vital signs reviewed and stable, and Patient moving all extremities X 4  Post vital signs: Reviewed and stable  Last Vitals:  Vitals Value Taken Time  BP 107/70 06/03/23 1342  Temp    Pulse 77 06/03/23 1343  Resp 17 06/03/23 1343  SpO2 95 % 06/03/23 1343    Last Pain:  Vitals:   06/03/23 1248  TempSrc: Oral  PainSc: 0-No pain         Complications: No notable events documented.

## 2023-06-03 NOTE — Anesthesia Preprocedure Evaluation (Signed)
Anesthesia Evaluation  Patient identified by MRN, date of birth, ID band Patient awake    Reviewed: Allergy & Precautions, NPO status , Patient's Chart, lab work & pertinent test results  History of Anesthesia Complications (+) Emergence Delirium and history of anesthetic complications  Airway Mallampati: III  TM Distance: >3 FB Neck ROM: full    Dental  (+) Chipped, Dental Advidsory Given   Pulmonary neg shortness of breath, neg COPD, Recent URI , Residual Cough, former smoker   Pulmonary exam normal        Cardiovascular hypertension, (-) angina (-) Past MI and (-) Cardiac Stents + dysrhythmias Atrial Fibrillation (-) Valvular Problems/Murmurs     Neuro/Psych  PSYCHIATRIC DISORDERS Anxiety Depression    negative neurological ROS     GI/Hepatic Neg liver ROS,GERD  ,,  Endo/Other  diabetes    Renal/GU negative Renal ROS  negative genitourinary   Musculoskeletal   Abdominal   Peds  Hematology negative hematology ROS (+)   Anesthesia Other Findings Past Medical History: No date: Anxiety No date: Atrial fibrillation (HCC) No date: Depression No date: Depression     Comment:  Phreesia 07/02/2020 No date: Diabetes mellitus without complication (HCC) No date: GERD (gastroesophageal reflux disease) No date: Hypertension No date: IBS (irritable bowel syndrome) No date: Neuromuscular disorder [facility])  Past Surgical History: No date: BREAST EXCISIONAL BIOPSY 07/24/2022: CATARACT EXTRACTION W/PHACO; Right     Comment:  Procedure: CATARACT EXTRACTION PHACO AND INTRAOCULAR               LENS PLACEMENT (IOC) RIGHT DIABETIC  5.93  00:42.5;                Surgeon: Galen Manila, MD;  Location: [facility] SURGERY              CNTR;  Service: Ophthalmology;  Laterality: Right; 08/07/2022: CATARACT EXTRACTION W/PHACO; Left     Comment:  Procedure: CATARACT EXTRACTION PHACO AND INTRAOCULAR               LENS PLACEMENT (IOC) LEFT  DIABETIC  12.33  00:58.3;                Surgeon: Galen Manila, MD;  Location: [facility] SURGERY              CNTR;  Service: Ophthalmology;  Laterality: Left; No date: CESAREAN SECTION; N/A     Comment:  Phreesia 07/02/2020 No date: CHOLECYSTECTOMY 1973: EXCISION / BIOPSY BREAST / NIPPLE / DUCT; Right     Comment:  duct removed No date: SPINE SURGERY; N/A     Comment:  Phreesia 07/02/2020 No date: TUBAL LIGATION; N/A     Comment:  Phreesia 07/02/2020     Reproductive/Obstetrics negative OB ROS                             Anesthesia Physical Anesthesia Plan  ASA: 3  Anesthesia Plan: General   Post-op Pain Management:    Induction: Intravenous  PONV Risk Score and Plan: 3 and Propofol infusion and TIVA  Airway Management Planned: Natural Airway and Nasal Cannula  Additional Equipment:   Intra-op Plan:   Post-operative Plan:   Informed Consent: I have reviewed the patients History and Physical, chart, labs and discussed the procedure including the risks, benefits and alternatives for the proposed anesthesia with the patient or authorized representative who has indicated his/her understanding and acceptance.     Dental Advisory Given  Plan  Discussed with: Anesthesiologist, CRNA and Surgeon  Anesthesia Plan Comments: (Patient reports emergence delirium with propofol after her colonoscopy. We discussed that anyone with such a history would be at risk of emergence delirium in the future. That substituting propofol for an anesthetic with benzodiazapines or ketamine would increase that risk even more.  That hopefully she will not have any problems because her exposure will be so low today but that she is still at risk. That we plan to use propofol today and she voiced assent.  Patient consented for risks of anesthesia including but not limited to:  - adverse reactions to medications - risk of airway placement if required - damage to eyes, teeth,  lips or other oral mucosa - nerve damage due to positioning  - sore throat or hoarseness - Damage to heart, brain, nerves, lungs, other parts of body or loss of life  Patient voiced understanding.)       Anesthesia Quick Evaluation

## 2023-06-04 ENCOUNTER — Encounter: Payer: Self-pay | Admitting: Cardiology

## 2023-06-04 NOTE — Anesthesia Postprocedure Evaluation (Signed)
Anesthesia Post Note  Patient: Lisa Gutierrez  Procedure(s) Performed: CARDIOVERSION  Patient location during evaluation: Specials Recovery Anesthesia Type: General Level of consciousness: awake and alert Pain management: pain level controlled Vital Signs Assessment: post-procedure vital signs reviewed and stable Respiratory status: spontaneous breathing, nonlabored ventilation, respiratory function stable and patient connected to nasal cannula oxygen Cardiovascular status: blood pressure returned to baseline and stable Postop Assessment: no apparent nausea or vomiting Anesthetic complications: no   No notable events documented.   Last Vitals:  Vitals:   06/03/23 1400 06/03/23 1415  BP: 109/73 120/62  Pulse: 70 68  Resp: 15 19  Temp:    SpO2: 92% 93%    Last Pain:  Vitals:   06/03/23 1415  TempSrc:   PainSc: 0-No pain                 Lenard Simmer

## 2023-06-06 ENCOUNTER — Other Ambulatory Visit: Payer: Self-pay | Admitting: Family Medicine

## 2023-06-06 DIAGNOSIS — F411 Generalized anxiety disorder: Secondary | ICD-10-CM

## 2023-06-08 ENCOUNTER — Other Ambulatory Visit: Payer: Self-pay | Admitting: Cardiology

## 2023-06-10 ENCOUNTER — Ambulatory Visit: Payer: Self-pay | Admitting: Family Medicine

## 2023-06-10 ENCOUNTER — Ambulatory Visit: Payer: PPO | Admitting: Family Medicine

## 2023-06-10 ENCOUNTER — Encounter: Payer: Self-pay | Admitting: Family Medicine

## 2023-06-10 NOTE — Telephone Encounter (Addendum)
 This RN made an outbound call to return the patient's call. Patient was triaged this morning and advised to see a provider within 24 hours. Per triage note, patient was not comfortable seeing a provider in person. Per patient's chart, her PCP advised that the patient could make a virtual appointment with another practice. The patient is extremely upset because she was able to get a virtual appointment scheduled and the copay was $200. Patient canceled the virtual appointment because of the copay. This RN attempted to schedule the patient for a virtual appointment at both another office and UC, and the copay was still listed as $200. Patient is beyond frustrated with the [facility] system and stated that she is not be treated appropriately. This RN offered to transfer patient directly to office and patient declined. This RN apologized for the inconvenience and advised that I would route this conversation to the office, for their discretion on scheduling. Patient would like an antibiotic to be prescribed and a call back.   Summary: Sinus infection   Copied From CRM [phone]. Reason for Triage: patient has sinus infection, ear ache eye are running  Reason for Disposition  [1] Follow-up call to recent contact AND [2] information only call, no triage required  Protocols used: Information Only Call - No Triage-A-AH

## 2023-06-10 NOTE — Telephone Encounter (Signed)
 Contacted pt and informed her that she should call the office and have them schedule her with a virtual visit at another office due to PCP and office not having any availability.  Per PCP she said that she could be evaluated virtually.

## 2023-06-10 NOTE — Telephone Encounter (Signed)
 Copied from CRM [id number]. Topic: Clinical - Red Word Triage >> Jun 10, 2023 12:07 PM Elle L wrote: Red Word that prompted transfer to Nurse Triage: The patient states that she has a sinus infection and she has discolored mucus with blood. She feels generally unwell. She had a heart ablation, RSV, Pneumonia, and the flu within the last month.   Chief Complaint: suspected sinus infection Symptoms: sinus pain right cheek radiating to teeth right side, discolored nasal discharge, nasal congestion, ear pain to right ear, postnasal drip, cough, fatigue, bad taste in mouth, "tired of feeling bad, can't get well, so mentally so horrible" Frequency: continual Pertinent Negatives: Patient denies fever, SOB, chest pain Disposition: [] 911 / [] ED /[] Urgent Care (no appt availability in office) / [x] Appointment(In office/virtual)/ []  [facility]/ [] Home Care/ [] Refused Recommended Disposition /[] [facility]/ [x]  Follow-up with PCP Additional Notes: Pt reporting concern for sinus infection following recent RSV, flu A, pneumonia, and heart ablation, pt reporting she "just feel rotten, tired of feeling bad, can't get well," reporting new acute symptoms for "probably about a week now" including nasal congestion, "green and bloody and rotten stuff coming out of my nose," post-nasal drip causing her to cough, and pressure to right cheek that is tender to touch and pain to teeth on right side, as well as pain in right ear, "bad taste in my mouth, everything tastes bad," and "all I wanna do is sleep." Pt reporting she has some mucinex, which pt states Edstrom PA recommended in response to pt's message earlier today. Pt confirms no SOB, chest pain, or fever. Pt reporting "don't have anything left, to be healthy, just wanna be healthy, know it's in God's hands but" pt states "all I've been through, thought I was going to die" making her feel "mentally so horrible." Empathized with pt. Advised pt be examined  in next 24 hours for symptoms, recommended in-person, pt reporting "just don't feel like going anywhere," states she has no one to drive her today, "don't wanna be around anyone else who's sick," and doesn't feel well, prefers virtual appt. No availability for virtual appt today with PCP office, earliest available is Thursday, advised that HP message would be sent to inquire for earlier appt for pt to receive call back to schedule. Advised call back if any worsening or need anything else. Pt verbalized understanding.  Reason for Disposition  Earache  Answer Assessment - Initial Assessment Questions 1. LOCATION: "Where does it hurt?"      Right cheek not bad 2. ONSET: "When did the sinus pain start?"  (e.g., hours, days)      Couple days ago, symptoms for probably about a week now 3. SEVERITY: "How bad is the pain?"   (Scale 1-10; mild, moderate or severe)   - MILD (1-3): doesn't interfere with normal activities    - MODERATE (4-7): interferes with normal activities (e.g., work or school) or awakens from sleep   - SEVERE (8-10): excruciating pain and patient unable to do any normal activities        Pressure and clogged right cheek sinus pain not bad but can feel it, touch it and it will hurt, teeth hurt on that side 5. NASAL CONGESTION: "Is the nose blocked?" If Yes, ask: "Can you open it or must you breathe through your mouth?"     Clogged up 6. NASAL DISCHARGE: "Do you have discharge from your nose?" If so ask, "What color?"     Green and bloody and  rotten stuff coming out of my nose 7. FEVER: "Do you have a fever?" If Yes, ask: "What is it, how was it measured, and when did it start?"      denies 8. OTHER SYMPTOMS: "Do you have any other symptoms?" (e.g., sore throat, cough, earache, difficulty breathing)     Pain in right ear, post nasal drip causing me to cough, bad taste in my mouth, all I wanna do is sleep  Protocols used: Sinus Pain or Congestion-A-AH

## 2023-06-10 NOTE — Telephone Encounter (Signed)
 Contacted pt and she is very upset, she said that she cancelled due to the $200.00 copay and she said that she just wanted to take care of this infection so it does not go to her heart.  I informed her that a provider would not just prescribe an antibiotic and that she would need to be evaluated due to all the things she has had going on. She ended the call and a few minutes later she called back and she allowed Morrie Sheldon to walk her through doing a MyChart urgent care virtual appointment, she also explained that she should request to be billed and then she should contact billing to take care of the copay if there is a problem.

## 2023-06-13 ENCOUNTER — Ambulatory Visit: Payer: Self-pay

## 2023-06-13 DIAGNOSIS — E1165 Type 2 diabetes mellitus with hyperglycemia: Secondary | ICD-10-CM | POA: Diagnosis not present

## 2023-06-13 DIAGNOSIS — E669 Obesity, unspecified: Secondary | ICD-10-CM | POA: Diagnosis not present

## 2023-06-13 DIAGNOSIS — E1159 Type 2 diabetes mellitus with other circulatory complications: Secondary | ICD-10-CM | POA: Diagnosis not present

## 2023-06-13 DIAGNOSIS — E1169 Type 2 diabetes mellitus with other specified complication: Secondary | ICD-10-CM | POA: Diagnosis not present

## 2023-06-13 DIAGNOSIS — I152 Hypertension secondary to endocrine disorders: Secondary | ICD-10-CM | POA: Diagnosis not present

## 2023-06-13 NOTE — Patient Outreach (Signed)
 Care Coordination   06/13/2023 Name: Lisa Gutierrez MRN: [id number] DOB: 15-Apr-1951   Care Coordination Outreach Attempts:  An unsuccessful telephone outreach was attempted today to offer the patient information about available complex care management services.  Follow Up Plan:  Additional outreach attempts will be made to offer the patient complex care management information and services.   Encounter Outcome:  Patient Visit Completed   Care Coordination Interventions:  No, not indicated    Lonzo Candy, BSN, [facility]  [facility], [facility]  Care Coordinator Phone: [phone]

## 2023-06-14 ENCOUNTER — Other Ambulatory Visit: Payer: Self-pay | Admitting: Pharmacist

## 2023-06-14 NOTE — Progress Notes (Signed)
   06/14/2023  Patient ID: Lisa Gutierrez, female   DOB: Aug 23, 1950, 73 y.o.   MRN: [id number]  Called and spoke with the patient on the phone today regarding her medications. She reports wanting to continue holding off for now since she has a Cardiology visit on 06/19/23 and is waiting to see how effective her changed medications are.   Also discussed that she gets Jardiance, Rybelsus, and Linzess from [facility], so these would not be added in her compliance packaging.   Advised that metformin appears due for a refill and she declined refill at the pharmacy due to having plenty leftover currently.  Requested to call again in 1 month to reassess again.    Marlowe Aschoff, PharmD [facility] Phone Number: [phone]

## 2023-06-18 NOTE — Progress Notes (Unsigned)
 Electrophysiology Clinic Note    Date:  06/19/2023  Patient ID:  Lisa Gutierrez, Lisa Gutierrez March 31, 1951, MRN [id number] PCP:  [name], PA  Cardiologist:  Lorine Bears, MD Electrophysiologist: Lanier Prude, MD    Discussed the use of AI scribe software for clinical note transcription with the patient, who gave verbal consent to proceed.   Patient Profile    Chief Complaint: AF ablation follow-up  History of Present Illness: Lisa Gutierrez is a 73 y.o. female with PMH notable for Afib, HTN, T2DM, anxiety, IBS; seen today for Lanier Prude, MD for routine electrophysiology followup.  She is s/p AFib ablation with PVI, posterior wall on 05/02/2023. She presented to ER 1/23 with SOB, hypoxic. CXR concerning for PNA. She was in sinus rhythm at presentation. She was admitted overnight.  I saw her 2/13 for routine 4 wk post-ablation appt where she was in AFib. She is s/p DCCV with return to SR.   On follow-up today, she is not aware of any AFib episodes. She does have rare "hiccup" or "catching" sensation in her chest. She brings BP long, most readings 110-130s / 70-80s.   She has a URI with increased nasal drainage and some facial tenderness and cough. She questions what medications she can safely take.  She is also battling constipation and questions what she can take for it.  Diligently takes eliquis BID, no missed doses, no bleeding concerns     AAD History: None     ROS:  Please see the history of present illness. All other systems are reviewed and otherwise negative.    Physical Exam    VS:  BP (!) 158/99 (BP Location: Left Arm, Patient Position: Sitting, Cuff Size: Normal)   Pulse 87   Ht 5\' 5"  (1.651 m)   Wt 208 lb 9.6 oz (94.6 kg)   SpO2 95%   BMI 34.71 kg/m  BMI: Body mass index is 34.71 kg/m.  Wt Readings from Last 3 Encounters:  06/19/23 208 lb 9.6 oz (94.6 kg)  06/03/23 210 lb 1.6 oz (95.3 kg)  05/30/23 210 lb (95.3 kg)      GEN- The patient is well appearing, alert and oriented x 3 today.   Lungs- Clear to ausculation bilaterally, normal work of breathing.  Heart- Regular rate and rhythm, no murmurs, rubs or gallops Extremities- No peripheral edema, warm, dry      Studies Reviewed   Previous EP, cardiology notes.    EKG is ordered. Personal review of EKG from today shows:    EKG Interpretation Date/Time:  Wednesday June 19 2023 11:23:15 EST Ventricular Rate:  87 PR Interval:  168 QRS Duration:  84 QT Interval:  364 QTC Calculation: 438 R Axis:   -26  Text Interpretation: Normal sinus rhythm Nonspecific ST and T wave abnormality Confirmed by Sherie Don [phone]) on 06/19/2023 11:24:11 AM    Cardiac CT, 04/18/2023 1. There is normal pulmonary vein drainage into the left atrium. (2 on the right and 2 on the left) with ostial measurements as above.  2. The left atrial appendage is a Windsock-cactus type with ostial size 29 x 23 mm and length 31 mm, Area 49 mm2. There is no thrombus in the left atrial appendage.  3. The esophagus runs in the left atrial midline and is not in the proximity to any of the pulmonary veins.  4. Coronary calcium score of 331. This was 85th percentile for age/gender.   TTE, 07/11/2022  1.  Left ventricular ejection fraction, by estimation, is 50 to 55%. The left ventricle has low normal function. The left ventricle has no regional wall motion abnormalities. There is mild left ventricular hypertrophy. Left ventricular diastolic  parameters are indeterminate.   2. Right ventricular systolic function is normal. The right ventricular size is normal. Tricuspid regurgitation signal is inadequate for assessing PA pressure.   3. Left atrial size was moderately dilated.   4. The mitral valve is normal in structure. No evidence of mitral valve regurgitation. No evidence of mitral stenosis.   5. The aortic valve is normal in structure. Aortic valve regurgitation is not visualized. No  aortic stenosis is present.     Assessment and Plan     #) parox AFib S/p AF ablation 04/2023 by Dr. Lalla Brothers Recurrence of AFib and now s/p DCCV with maintaining sinus rhythm Continue 75mg  lopressor BID   #) Hypercoag d/t parox afib CHA2DS2-VASc Score = at least 4 [CHF History: 0, HTN History: 1, Diabetes History: 1, Stroke History: 0, Vascular Disease History: 0, Age Score: 1, Gender Score: 1].  Therefore, the patient's annual risk of stroke is 4.8 %.    Stroke ppx - 5mg  eliquis BID, appropriately dosed No bleeding concerns  #) HTN Elevated in office, but very well controlled by home measurements Continue 25mg  losartan Continue lopressor as above   #) Upper Respiratory Infection Symptoms suggest viral infection. Advised on symptomatic management and avoiding decongestants affecting BP and heart rhythm. - Recommend Tylenol for facial pain. - Advise consulting pharmacist for decongestants, avoiding pseudoephedrine. - Suggest home remedies: chicken soup, hydration, Vicks VapoRub.  #) Constipation Persistent constipation despite Linzess. Discussed increasing water, fiber, exercise, and consulting primary care for Linzess adjustment. - Encourage increased water intake, dietary fiber, and regular exercise. - Advise consulting primary care for potential Linzess dosage adjustment.        Current medicines are reviewed at length with the patient today.   The patient does not have concerns regarding her medicines.  The following changes were made today:  none  Labs/ tests ordered today include:  Orders Placed This Encounter  Procedures   EKG 12-Lead     Disposition: Follow up with Dr. Lalla Brothers or EP APP  in ~6 weeks as scheduled    Signed, Sherie Don, NP  06/19/23  12:11 PM  Electrophysiology [facility]

## 2023-06-19 ENCOUNTER — Ambulatory Visit: Payer: PPO | Attending: Cardiology | Admitting: Cardiology

## 2023-06-19 VITALS — BP 158/99 | HR 87 | Ht 65.0 in | Wt 208.6 lb

## 2023-06-19 DIAGNOSIS — I4891 Unspecified atrial fibrillation: Secondary | ICD-10-CM | POA: Diagnosis not present

## 2023-06-19 DIAGNOSIS — I480 Paroxysmal atrial fibrillation: Secondary | ICD-10-CM

## 2023-06-19 DIAGNOSIS — J069 Acute upper respiratory infection, unspecified: Secondary | ICD-10-CM | POA: Diagnosis not present

## 2023-06-19 DIAGNOSIS — I10 Essential (primary) hypertension: Secondary | ICD-10-CM

## 2023-06-19 DIAGNOSIS — D6869 Other thrombophilia: Secondary | ICD-10-CM

## 2023-06-19 MED ORDER — APIXABAN 5 MG PO TABS: 5.0000 mg | ORAL_TABLET | Freq: Two times a day (BID) | ORAL | 11 refills | Status: DC

## 2023-06-19 NOTE — Patient Instructions (Signed)
 Medication Instructions:  The current medical regimen is effective;  continue present plan and medications.  *If you need a refill on your cardiac medications before your next appointment, please call your pharmacy*   Follow-Up: At [facility] you and your health needs are our priority.  As part of our continuing mission to provide you with exceptional heart care, we have created designated Provider Care Teams.  These Care Teams include your primary Cardiologist (physician) and Advanced Practice Providers (APPs -  Physician Assistants and Nurse Practitioners) who all work together to provide you with the care you need, when you need it.  We recommend signing up for the patient portal called "MyChart".  Sign up information is provided on this After Visit Summary.  MyChart is used to connect with patients for Virtual Visits (Telemedicine).  Patients are able to view lab/test results, encounter notes, upcoming appointments, etc.  Non-urgent messages can be sent to your provider as well.   To learn more about what you can do with MyChart, go to [email or web address].    Your next appointment:   As scheduled

## 2023-06-22 ENCOUNTER — Other Ambulatory Visit: Payer: Self-pay | Admitting: Cardiology

## 2023-06-26 ENCOUNTER — Telehealth: Payer: Self-pay | Admitting: *Deleted

## 2023-06-26 NOTE — Progress Notes (Signed)
 Complex Care Management Care Guide Note  06/26/2023 Name: Lisa Gutierrez MRN: [id number] DOB: 1950-11-06  Lisa Gutierrez is a 73 y.o. year old female who is a primary care patient of [name], [place] and is actively engaged with the care management team. I reached out to [name] by phone today to assist with scheduling  with the RN Case Manager.  Follow up plan: Telephone appointment with complex care management team member scheduled for:  3/14  Gwenevere Ghazi  [facility]  [facility], [facility] Guide  Direct Dial: [phone]  Fax [phone]

## 2023-06-26 NOTE — Progress Notes (Signed)
 Complex Care Management Care Guide Note  06/26/2023 Name: Lisa Gutierrez MRN: [id number] DOB: 1950/12/14  Lisa Gutierrez is a 73 y.o. year old female who is a primary care patient of [name], [place] and is actively engaged with the care management team. I reached out to [name] by phone today to assist with re-scheduling  with the RN Case Manager.  Follow up plan: Unsuccessful telephone outreach attempt made. A HIPAA compliant phone message was left for the patient providing contact information and requesting a return call.  SIGNATURE

## 2023-06-28 ENCOUNTER — Ambulatory Visit: Payer: Self-pay

## 2023-06-28 ENCOUNTER — Encounter

## 2023-06-28 NOTE — Patient Outreach (Signed)
 Care Coordination   Follow Up Visit Note   06/28/2023 Name: Lisa Gutierrez MRN: [id number] DOB: 1950-04-17  Lisa Gutierrez is a 73 y.o. year old female who sees [name], [place] for primary care. I spoke with  [name] by phone today.  What matters to the patients health and wellness today?  I recently conducted a conversation with Lisa Gutierrez, during which she stated it was necessary for her to find a new physician following the departure of her Nurse practitioner. I provided her with a contact number for assistance in locating a suitable physician in proximity to her residence.  Furthermore, Lisa Gutierrez expressed challenges related to filling her pillbox. We discussed her concerns, and I talked about an alternative method for organizing her medications. She indicated her intention to implement this new approach to enhance her medication management.  In addition, Lisa Gutierrez reported that her blood pressure has been stable at 128/60. Her blood sugar level was recorded at 165 today. She also mentioned experiencing a mild headache, which she attributed to sinus issues.    Goals Addressed             This Visit's Progress    I want help to understand my conditions so I can prepare for the furture       Patient Goals/Self Care Activities: -Patient/Caregiver will self-administer medications as prescribed as evidenced by self-report/primary caregiver report  -Patient/Caregiver will attend all scheduled provider appointments as evidenced by clinician review of documented attendance to scheduled appointments and patient/caregiver report -Patient/Caregiver will call pharmacy for medication refills as evidenced by patient report and review of pharmacy fill history as appropriate -Patient/Caregiver will call provider office for new concerns or questions as evidenced by review of documented incoming telephone call notes and patient report -Patient/Caregiver will  focus on medication adherence by taking medications as prescribed  -Checks BP and records as discussed -check blood sugar at prescribed times -record values and write them down take them to all doctor visits   -Take the medications prescribed to control your heart rate and rhythm and reduce your risk of blood clot formation (anticoagulants or antiplatelet medications/"blood thinners"). -Learn to check your pulse and write down the number every day.   BP Readings from Last 3 Encounters:  06/19/23 (!) 158/99  06/03/23 120/62  05/30/23 130/78   Lab Results  Component Value Date   HGBA1C 9.2 (H) 03/26/2023             SDOH assessments and interventions completed:  No     Care Coordination Interventions:  Yes, provided   Interventions Today    Flowsheet Row Most Recent Value  Chronic Disease   Chronic disease during today's visit Hypertension (HTN), Diabetes  General Interventions   General Interventions Discussed/Reviewed General Interventions Discussed, General Interventions Reviewed  Education Interventions   Education Provided Provided Education  Provided Verbal Education On Medication  Pharmacy Interventions   Pharmacy Dicussed/Reviewed Pharmacy Topics Discussed  Safety Interventions   Safety Discussed/Reviewed Safety Discussed        Follow up plan: Follow up call scheduled for 07/31/23  130 pm    Encounter Outcome:  Patient Visit Completed   Juanell Fairly RN, BSN, [facility]  [facility], [facility]  Care Coordinator Phone: [phone]

## 2023-06-28 NOTE — Patient Instructions (Signed)
 Visit Information  Thank you for taking time to visit with me today. Please don't hesitate to contact me if I can be of assistance to you.   Following are the goals we discussed today:   Goals Addressed             This Visit's Progress    I want help to understand my conditions so I can prepare for the furture       Patient Goals/Self Care Activities: -Patient/Caregiver will self-administer medications as prescribed as evidenced by self-report/primary caregiver report  -Patient/Caregiver will attend all scheduled provider appointments as evidenced by clinician review of documented attendance to scheduled appointments and patient/caregiver report -Patient/Caregiver will call pharmacy for medication refills as evidenced by patient report and review of pharmacy fill history as appropriate -Patient/Caregiver will call provider office for new concerns or questions as evidenced by review of documented incoming telephone call notes and patient report -Patient/Caregiver will focus on medication adherence by taking medications as prescribed  -Checks BP and records as discussed -check blood sugar at prescribed times -record values and write them down take them to all doctor visits   -Take the medications prescribed to control your heart rate and rhythm and reduce your risk of blood clot formation (anticoagulants or antiplatelet medications/"blood thinners"). -Learn to check your pulse and write down the number every day.   BP Readings from Last 3 Encounters:  06/19/23 (!) 158/99  06/03/23 120/62  05/30/23 130/78   Lab Results  Component Value Date   HGBA1C 9.2 (H) 03/26/2023             Our next appointment is by telephone on 07/31/23 at 130 pm  Please call the care guide team at [phone] if you need to cancel or reschedule your appointment.   If you are experiencing a Mental Health or Behavioral Health Crisis or need someone to talk to, please call 1-800-273-TALK (toll free,  24 hour hotline)  Patient verbalizes understanding of instructions and care plan provided today and agrees to view in MyChart. Active MyChart status and patient understanding of how to access instructions and care plan via MyChart confirmed with patient.     Juanell Fairly RN, BSN, [facility]   [facility], [facility]  Care Coordinator Phone: [phone]

## 2023-07-06 ENCOUNTER — Other Ambulatory Visit: Payer: Self-pay | Admitting: Cardiology

## 2023-07-07 NOTE — Progress Notes (Unsigned)
 Virtual Visit via Video Note  I connected with Lisa Gutierrez on 07/12/23 at 11:30 AM EDT by a video enabled telemedicine application and verified that I am speaking with the correct person using two identifiers.  Location: Patient: home Provider: office Persons participated in the visit- patient, provider    I discussed the limitations of evaluation and management by telemedicine and the availability of in person appointments. The patient expressed understanding and agreed to proceed.    I discussed the assessment and treatment plan with the patient. The patient was provided an opportunity to ask questions and all were answered. The patient agreed with the plan and demonstrated an understanding of the instructions.   The patient was advised to call back or seek an in-person evaluation if the symptoms worsen or if the condition fails to improve as anticipated.   Lisa Hotter, MD    [facility] MD/PA/NP OP Progress Note  07/12/2023 12:14 PM Lisa Gutierrez  MRN:  [id number]  Chief Complaint:  Chief Complaint  Patient presents with   Follow-up   HPI:  According to the chart review, the following events have occurred since the last visit: The patient was admitted due to sepsis, secondary to pneumonia, and RSV positive.   This is a follow-up appointment for depression and anxiety.  She states that she feels the same.  She cries a lot.  She feels tired and weak.  She states that she has the same problem with her husband.  She is tired with it.  Although her husband was to bring her to the place, she got called for a work. He would not help her as he would do for other people.  She states that they are always getting into an argument. Although she wants to go out, she cannot do due to the dizziness. She feels frustrated and exhausted.  Although she may sleep for 8 hours, it is not restorative.  She has intense anxiety every day.  She also is scared of fall.  She did have a fall  when she missed step, although she did not injure herself.  She feels dizzy.  She does not feel stable.  She denies SI.  She is not sure if the medication is making her irritable, that she has been very irritable.  She agrees with the plan as outlined below.   Wt Readings from Last 3 Encounters:  06/19/23 208 lb 9.6 oz (94.6 kg)  06/03/23 210 lb 1.6 oz (95.3 kg)  05/30/23 210 lb (95.3 kg)      Daily routine: takes care of her dogs, cats, house chores Exercise: unable to do due to knee pain Employment:  Support: none (talks with her oldest granddaughter, who has left to college) Household: husband Marital status: married for 52 years  Number of children: 2 sons.   Visit Diagnosis:    ICD-10-CM   1. Insomnia, unspecified type  G47.00     2. MDD (major depressive disorder), recurrent episode, moderate (HCC)  F33.1 venlafaxine XR (EFFEXOR-XR) 150 MG 24 hr capsule    venlafaxine XR (EFFEXOR-XR) 37.5 MG 24 hr capsule    3. GAD (generalized anxiety disorder)  F41.1 venlafaxine XR (EFFEXOR-XR) 150 MG 24 hr capsule    venlafaxine XR (EFFEXOR-XR) 37.5 MG 24 hr capsule      Past Psychiatric History: Please see initial evaluation for full details. I have reviewed the history. No updates at this time.     Past Medical History:  Past Medical History:  Diagnosis  Date   Anxiety    Atrial fibrillation [facility])    Depression    Depression    Phreesia 07/02/2020   Diabetes mellitus without complication (HCC)    GERD (gastroesophageal reflux disease)    Hypertension    IBS (irritable bowel syndrome)    Neuromuscular disorder (HCC)     Past Surgical History:  Procedure Laterality Date   ATRIAL FIBRILLATION ABLATION N/A 05/02/2023   Procedure: ATRIAL FIBRILLATION ABLATION;  Surgeon: Lanier Prude, MD;  Location: [facility];  Service: Cardiovascular;  Laterality: N/A;   BREAST EXCISIONAL BIOPSY     CARDIOVERSION N/A 09/03/2022   Procedure: CARDIOVERSION;  Surgeon: Iran Ouch, MD;  Location: [facility] ORS;  Service: Cardiovascular;  Laterality: N/A;   CARDIOVERSION N/A 06/03/2023   Procedure: CARDIOVERSION;  Surgeon: Debbe Odea, MD;  Location: [facility] ORS;  Service: Cardiovascular;  Laterality: N/A;   CATARACT EXTRACTION W/PHACO Right 07/24/2022   Procedure: CATARACT EXTRACTION PHACO AND INTRAOCULAR LENS PLACEMENT (IOC) RIGHT DIABETIC  5.93  00:42.5;  Surgeon: Galen Manila, MD;  Location: [facility];  Service: Ophthalmology;  Laterality: Right;   CATARACT EXTRACTION W/PHACO Left 08/07/2022   Procedure: CATARACT EXTRACTION PHACO AND INTRAOCULAR LENS PLACEMENT (IOC) LEFT DIABETIC  12.33  00:58.3;  Surgeon: Galen Manila, MD;  Location: [facility];  Service: Ophthalmology;  Laterality: Left;   CESAREAN SECTION N/A    Phreesia 07/02/2020   CHOLECYSTECTOMY     EXCISION / BIOPSY BREAST / NIPPLE / DUCT Right 1973   duct removed   SPINE SURGERY N/A    Phreesia 07/02/2020   TUBAL LIGATION N/A    Phreesia 07/02/2020    Family Psychiatric History: Please see initial evaluation for full details. I have reviewed the history. No updates at this time.     Family History:  Family History  Problem Relation Age of Onset   Diabetes Mother    Hypertension Mother    Coronary artery disease Father    Glaucoma Father    Breast cancer Neg Hx     Social History:  Social History   Socioeconomic History   Marital status: Married    Spouse name: Not on file   Number of children: Not on file   Years of education: Not on file   Highest education level: Not on file  Occupational History   Not on file  Tobacco Use   Smoking status: Former    Passive exposure: Past   Smokeless tobacco: Never  Vaping Use   Vaping status: Never Used  Substance and Sexual Activity   Alcohol use: No    Alcohol/week: 0.0 standard drinks of alcohol   Drug use: Never   Sexual activity: Not Currently    Partners: Male  Other Topics Concern   Not on file  Social  History Narrative   Not on file   Social Drivers of Health   Financial Resource Strain: Low Risk  (10/15/2022)   Overall Financial Resource Strain (CARDIA)    Difficulty of Paying Living Expenses: Not very hard  Food Insecurity: No Food Insecurity (10/15/2022)   Hunger Vital Sign    Worried About Running Out of Food in the Last Year: Never true    Ran Out of Food in the Last Year: Never true  Transportation Needs: No Transportation Needs (08/09/2022)   PRAPARE - Administrator, Civil Service (Medical): No    Lack of Transportation (Non-Medical): No  Physical Activity: Inactive (08/09/2022)   Exercise Vital  Sign    Days of Exercise per Week: 0 days    Minutes of Exercise per Session: 0 min  Stress: Stress Concern Present (10/15/2022)   [employer] of Occupational Health - Occupational Stress Questionnaire    Feeling of Stress : Rather much  Social Connections: Socially Integrated (08/09/2022)   Social Connection and Isolation Panel [NHANES]    Frequency of Communication with Friends and Family: More than three times a week    Frequency of Social Gatherings with Friends and Family: More than three times a week    Attends Religious Services: More than 4 times per year    Active Member of [employer] or Organizations: Yes    Attends Engineer, structural: More than 4 times per year    Marital Status: Married    Allergies:  Allergies  Allergen Reactions   Aspirin Other (See Comments)    GI upset   Ibuprofen Other (See Comments)    GERD   Levofloxacin Other (See Comments)    Weight gain   Meloxicam Other (See Comments)    GI upset   Morphine Other (See Comments)   Naprosyn  [Naproxen] Other (See Comments)    GI upset   Nsaids Other (See Comments)    GI upset   Ozempic (0.25 Or 0.5 Mg-Dose) [Semaglutide(0.25 Or 0.5mg -Dos)] Nausea And Vomiting   Propofol Other (See Comments)    Had horrible nightmares   Quinolones    Robinul  [Glycopyrrolate] Nausea And Vomiting    Penicillin V Potassium Rash   Sulfa Antibiotics Rash    Metabolic Disorder Labs: Lab Results  Component Value Date   HGBA1C 9.2 (H) 03/26/2023   MPG 169 07/10/2022   No results found for: "PROLACTIN" Lab Results  Component Value Date   CHOL 163 03/26/2023   TRIG 340 (H) 03/26/2023   HDL 24 (L) 03/26/2023   CHOLHDL 6.8 (H) 03/26/2023   LDLCALC 83 03/26/2023   LDLCALC 123 (H) 08/24/2021   Lab Results  Component Value Date   TSH 0.814 03/26/2023   TSH 0.908 07/10/2022    Therapeutic Level Labs: No results found for: "LITHIUM" No results found for: "VALPROATE" No results found for: "CBMZ"  Current Medications: Current Outpatient Medications  Medication Sig Dispense Refill   acetaminophen (TYLENOL) 500 MG tablet Take 1,000 mg by mouth every 8 (eight) hours as needed for mild pain (pain score 1-3), moderate pain (pain score 4-6) or headache.     ALPRAZolam (XANAX) 0.5 MG tablet Take 1 tablet (0.5 mg total) by mouth at bedtime as needed. 30 tablet 1   apixaban (ELIQUIS) 5 MG TABS tablet Take 1 tablet (5 mg total) by mouth 2 (two) times daily. 60 tablet 11   BLACK ELDERBERRY PO Take 1 tablet by mouth daily.     Blood Glucose Monitoring Suppl (ONE TOUCH ULTRA 2) w/Device KIT CHECK IN THE MORNING, AT NOON, AND AT BEDTIME. MAY SUBSTITUTE TO ANY MANUFACTURER COVERED BY PATIENT'S INSURANCE. 300 kit 3   [START ON 07/30/2023] busPIRone (BUSPAR) 5 MG tablet Take 1 tablet (5 mg total) by mouth daily AND 2 tablets (10 mg total) every evening. 90 tablet 0   Calcium Carb-Cholecalciferol (CALCIUM 600/VITAMIN D3) 600-20 MG-MCG TABS Take 1 tablet by mouth 2 (two) times daily.     Cholecalciferol (VITAMIN D3) 50 MCG (2000 UT) capsule Take 2,000 Units by mouth daily.     clobetasol ointment (TEMOVATE) 0.05 % Apply topically 2 (two) times daily. Apply topically to affected twice daily until  improved. (Patient taking differently: Apply 1 Application topically 2 (two) times daily as needed (progeria  nodularis).) 30 g 2   digoxin (LANOXIN) 0.25 MG tablet TAKE 1 TABLET BY MOUTH EVERY DAY 90 tablet 3   Docusate Calcium (STOOL SOFTENER PO) Take 2 tablets by mouth at bedtime.     empagliflozin (JARDIANCE) 25 MG TABS tablet Take 1 tablet (25 mg total) by mouth daily before breakfast. 90 tablet 1   Flaxseed, Linseed, (FLAXSEED OIL) 1400 MG CAPS Take 1,400 mg by mouth 2 (two) times daily.     furosemide (LASIX) 20 MG tablet TAKE 1 TABLET BY MOUTH EVERY DAY 30 tablet 6   Glucose Blood (BLOOD GLUCOSE TEST STRIPS) STRP 1 each by In Vitro route in the morning, at noon, and at bedtime. May substitute to any manufacturer covered by patient's insurance. 100 strip 9   glucose blood test strip Use as instructed 100 each 12   LINZESS 145 MCG CAPS capsule Take 1 capsule (145 mcg total) by mouth daily. 30 capsule 2   losartan (COZAAR) 25 MG tablet Take 1 tablet (25 mg total) by mouth daily. 90 tablet 3   metFORMIN (GLUCOPHAGE-XR) 500 MG 24 hr tablet TAKE 1 TABLET BY MOUTH TWICE A DAY WITH FOOD 60 tablet 1   metoprolol tartrate (LOPRESSOR) 50 MG tablet TAKE 1.5 TABLETS BY MOUTH 2 TIMES DAILY. 90 tablet 3   Multiple Vitamins-Minerals (MULTIVITAMIN ADULT PO) Take 1 tablet by mouth daily. One a day     pantoprazole (PROTONIX) 40 MG tablet TAKE 1 TABLET BY MOUTH TWICE A DAY 180 tablet 1   RYBELSUS 7 MG TABS Take 7 mg by mouth daily.     [START ON 08/04/2023] venlafaxine XR (EFFEXOR-XR) 150 MG 24 hr capsule Take 1 capsule (150 mg total) by mouth daily with breakfast. Take total of 187.5 mg daily. Take along with 37.5 mg cap 90 capsule 0   [START ON 08/04/2023] venlafaxine XR (EFFEXOR-XR) 37.5 MG 24 hr capsule Take 1 capsule (37.5 mg total) by mouth daily with breakfast. Take total of 187.5 mg daily. Take along with 150 mg cap 90 capsule 0   No current facility-administered medications for this visit.     Musculoskeletal: Strength & Muscle Tone:  N/A Gait & Station:  N/A Patient leans: N/A  Psychiatric Specialty  Exam: Review of Systems  Psychiatric/Behavioral:  Positive for dysphoric mood and sleep disturbance. Negative for agitation, behavioral problems, confusion, decreased concentration, hallucinations, self-injury and suicidal ideas. The patient is nervous/anxious. The patient is not hyperactive.   All other systems reviewed and are negative.   There were no vitals taken for this visit.There is no height or weight on file to calculate BMI.  General Appearance: Well Groomed  Eye Contact:  Good  Speech:  Clear and Coherent  Volume:  Normal  Mood:  Anxious and Irritable  Affect:  Appropriate, Congruent, and Full Range  Thought Process:  Coherent  Orientation:  Full (Time, Place, and Person)  Thought Content: Logical   Suicidal Thoughts:  No  Homicidal Thoughts:  No  Memory:  Immediate;   Good  Judgement:  Good  Insight:  Good  Psychomotor Activity:  Normal  Concentration:  Concentration: Good and Attention Span: Good  Recall:  Good  Fund of Knowledge: Good  Language: Good  Akathisia:  No  Handed:  Right  AIMS (if indicated): not done  Assets:  Communication Skills Desire for Improvement  ADL's:  Intact  Cognition: WNL  Sleep:  Poor  Screenings: GAD-7    Flowsheet Row Office Visit from 05/09/2023 in [facility] at [facility] Visit from 12/27/2022 in [facility] at [facility] Video Visit from 11/08/2022 in [facility] Care at [facility] Visit from 09/25/2022 in [facility] Primary Care at [facility] Erroneous Encounter from 07/18/2022 in [facility] Care at [facility]  Total GAD-7 Score 15 17 15 12 13       Mini-Mental    Flowsheet Row Clinical Support from 02/12/2020 in [facility] Clinical Support from 02/06/2018 in [facility]  Total Score (max 30 points ) 29 30      PHQ2-9    Flowsheet Row Office Visit from 05/09/2023 in [facility] Care at [facility] Visit from  12/27/2022 in [facility] Primary Care at [facility] Video Visit from 11/08/2022 in [facility] at [facility] Coordination from 10/15/2022 in [facility] Care Coordination Office Visit from 09/25/2022 in [facility] Primary Care at [facility]  PHQ-2 Total Score 4 6 6 5 5   PHQ-9 Total Score 14 24 22 19 19       Flowsheet Row ED from 05/09/2023 in [facility] Emergency Department at [facility] Admission (Discharged) from 05/02/2023 in [facility] CARDIAC CATH LAB Admission (Discharged) from 08/07/2022 in [facility] SURGICAL CENTER PERIOP  C-SSRS RISK CATEGORY No Risk No Risk No Risk        Assessment and Plan:  Anusha Claus is a 73 y.o. year old female with a history of depression, hypertension, diabetes, GERD, newly diagnosed Afib with RVR, who presents for follow up appointment for below.   1. MDD (major depressive disorder), recurrent episode, moderate (HCC) 2. GAD (generalized anxiety disorder) Acute stressors include: pending cataract surgery, fall  Other stressors include: marital conflict, conflict with her children, financial strain  History:     The exam is notable for tense affect, and she reports worsening in depressive symptoms, anxiety, irritability since the last visit.  Discussed with the patient at length that it is hard to assess the effectiveness of the treatment if she is unable to keep the appointment as scheduled.  Given the additional concern of ongoing dizziness, an upcoming appointment with her cardiologist, she agrees to stay on the current medication regimen for now.  Will continue venlafaxine to target depression and anxiety.  Will continue BuSpar for anxiety.  She will greatly benefit from CBT; she is willing to reconnect with her previous therapist and/or establishing care on site. Will plan for sooner visit and adjust medication if indicated.   # dizziness She reports worsening in.  It is  difficult to discern whether medication related or not.  She has an upcoming appointment with cardiologist.  Will not make adjustment in her medication for now.    # insomnia - sleep eval long time ago, reportedly normal.  Slightly worsening.  She complains of middle insomnia and daytime fatigue. Although she was strongly encouraged to consider a sleep evaluation, she declined at this.  Will make the above intervention as outlined above.    Plan Continue venlafaxine 187.5 mg daily- monitor weight gain Continue Buspar 5 mg daily, 10 mg in PM - monitor drowsiness Send message to Ms. Hussami to see if she can do therapy with her again. If not, she is willing to try therapist onsite virtually Next appointment : 2/5 at 3 pm, video.  She declined in person visit due to her physical condition   Past trials of medication: sertraline, citalopram, lexapro, fluoxetine, bupropion (headache)   The patient demonstrates the following risk factors for suicide: Chronic risk factors for suicide include: psychiatric disorder of depression and chronic pain. Acute risk factors for suicide include: family or marital conflict. Protective factors for this patient include: hope for the future. Considering these factors, the overall suicide risk at this point appe  This visit involved a longitudinal and complex condition requiring extended medical decision-making, coordination of care, and management beyond what is typically captured in CPT 99214. The complexity of the patient's condition justifies the use of G2211.   Collaboration of Care: Collaboration of Care: Other reviewed notes in Epic, communication with her previous therapist, Ms. Langley Gauss  Patient/Guardian was advised Release of Information must be obtained prior to any record release in order to collaborate their care with an outside provider. Patient/Guardian was advised if they have not already done so to contact the registration department to sign all  necessary forms in order for [place] to release information regarding their care.   Consent: Patient/Guardian gives verbal consent for treatment and assignment of benefits for services provided during this visit. Patient/Guardian expressed understanding and agreed to proceed.    Lisa Hotter, MD 07/12/2023, 12:14 PM

## 2023-07-12 ENCOUNTER — Other Ambulatory Visit: Payer: Self-pay | Admitting: Pharmacist

## 2023-07-12 ENCOUNTER — Encounter: Payer: Self-pay | Admitting: Psychiatry

## 2023-07-12 ENCOUNTER — Telehealth: Payer: Self-pay | Admitting: Psychiatry

## 2023-07-12 DIAGNOSIS — F331 Major depressive disorder, recurrent, moderate: Secondary | ICD-10-CM | POA: Diagnosis not present

## 2023-07-12 DIAGNOSIS — G4700 Insomnia, unspecified: Secondary | ICD-10-CM

## 2023-07-12 DIAGNOSIS — R42 Dizziness and giddiness: Secondary | ICD-10-CM | POA: Diagnosis not present

## 2023-07-12 DIAGNOSIS — F411 Generalized anxiety disorder: Secondary | ICD-10-CM

## 2023-07-12 MED ORDER — VENLAFAXINE HCL ER 150 MG PO CP24: 150.0000 mg | ORAL_CAPSULE | Freq: Every day | ORAL | 0 refills | Status: DC

## 2023-07-12 MED ORDER — VENLAFAXINE HCL ER 37.5 MG PO CP24: 37.5000 mg | ORAL_CAPSULE | Freq: Every day | ORAL | 0 refills | Status: DC

## 2023-07-12 MED ORDER — BUSPIRONE HCL 5 MG PO TABS: ORAL_TABLET | ORAL | 0 refills | Status: AC

## 2023-07-16 ENCOUNTER — Telehealth: Payer: Self-pay

## 2023-07-16 NOTE — Patient Outreach (Signed)
 Care Coordination   Follow Up Visit Note   07/16/2023 Name: Lisa Gutierrez MRN: [id number] DOB: Sep 08, 1950  Lisa Gutierrez is a 73 y.o. year old female who sees [name], [place] for primary care. I spoke with  Lisa Gutierrez by phone today.  What matters to the patients health and wellness today?  I communicated with Lisa Gutierrez, who informed me about her recent conversation with Dr. Zenovia Jarred. He is currently attempting to locate a therapist for her. Furthermore, she indicated that she will be visiting her cardiologist due to experiencing a slight sense of fatigue. Lisa Gutierrez is also in the process of finding a physician who is more conveniently located and has assured me that she will keep me updated on her progress.       SDOH assessments and interventions completed:  No     Care Coordination Interventions:  No, not indicated   Follow up plan:  follow up at next scheduled interval    Encounter Outcome:  Patient Visit Completed   Juanell Fairly RN, BSN, [facility] [place]  [facility], [facility]  Care Coordinator Phone: [phone]

## 2023-07-18 ENCOUNTER — Other Ambulatory Visit: Payer: Self-pay | Admitting: Pharmacist

## 2023-07-18 NOTE — Progress Notes (Unsigned)
   07/18/2023  Patient ID: Lisa Gutierrez, female   DOB: March 23, 1951, 73 y.o.   MRN: [id number]  Called and spoke with the patient on the phone today.  Reports current concern is hemorrhoids causing worse than usual bleeding. Hemorrhoids are a constant problem, but bleeding every day now.  Currently taking: Ducolax (Bisacodyl) 5mg - took 2 yesterday due to severe constipation Docusate 250mg - 2 at night and 1 in the morning; usually takes 2 every night Linzess daily  Reports they worsened with Rybelsus 14mg  initiation. However, blood sugars have decreased significantly since starting this medication. BG readings: 200 prior, then 156, 165, 170, 160, 140, 134, 135. [place] Spaniel it makes her more hungry than the 7mg  did. Advised this is not a normal concern due to mechanism being to decrease appetite. Hopefully will go away with time.    Plan: - Keep Rybelsus for now- continue to monitor constipation severity - Increase Linzess to daily- need to submit PAP to [employer] for increase - Docusate should be 1 tablet daily - Bisacodyl is for emergency situations where it has been a few days - Add Miralax once daily instead while waiting for Linzess PAP to be approved and shipped - Will send suppository Rx to pharmacy depending on what PCP prefers    Ricka Burdock, PharmD [facility]  Phone Number: [phone]

## 2023-07-19 ENCOUNTER — Other Ambulatory Visit: Payer: Self-pay | Admitting: Family Medicine

## 2023-07-19 MED ORDER — HYDROCORTISONE ACETATE 25 MG RE SUPP: 25.0000 mg | Freq: Two times a day (BID) | RECTAL | 1 refills | Status: DC

## 2023-07-19 NOTE — Progress Notes (Signed)
 Thank you, I've sent in the anusol-hc suppository

## 2023-07-24 ENCOUNTER — Encounter: Payer: Self-pay | Admitting: Cardiology

## 2023-07-24 ENCOUNTER — Ambulatory Visit: Payer: PPO | Attending: Cardiology | Admitting: Cardiology

## 2023-07-24 VITALS — BP 138/72 | HR 85 | Ht 66.0 in | Wt 210.0 lb

## 2023-07-24 DIAGNOSIS — E669 Obesity, unspecified: Secondary | ICD-10-CM | POA: Diagnosis not present

## 2023-07-24 DIAGNOSIS — I480 Paroxysmal atrial fibrillation: Secondary | ICD-10-CM

## 2023-07-24 DIAGNOSIS — I10 Essential (primary) hypertension: Secondary | ICD-10-CM | POA: Diagnosis not present

## 2023-07-24 DIAGNOSIS — F419 Anxiety disorder, unspecified: Secondary | ICD-10-CM

## 2023-07-24 DIAGNOSIS — E1165 Type 2 diabetes mellitus with hyperglycemia: Secondary | ICD-10-CM | POA: Diagnosis not present

## 2023-07-24 NOTE — Progress Notes (Addendum)
 Cardiology Office Note:  .   Date:  07/24/2023  ID:  Lisa Gutierrez, DOB Mar 27, 1951, MRN [id number] PCP: Noreene Bearded, PA  [facility] Providers Cardiologist:  Antionette Kirks, MD Electrophysiologist:  Boyce Byes, MD    History of Present Illness: .   Lisa Gutierrez is a 73 y.o. female with a past medical history of essential hypertension, chronic electrolyte abnormalities, GERD, IBS with constipation, chronic superficial gastritis without bleeding, fatty liver disease, type 2 diabetes, GAD, insomnia, chronic history of statin intolerance, morbid obesity, persistent atrial fibrillation status post DES ECV x 2 and all ablation, who is here today for follow-up.   She had previously presented for cataract surgery on 07/10/2022 was found to be in atrial fibrillation with RVR by anesthesia.  Heart rate was 1 70-1 9 past time and procedure was subsequently canceled.  Echocardiogram revealed an LVEF of 50-55%, no RWMA, no valvular abnormalities were noted.  She was started on metoprolol , dig, and apixaban  and subsequently discharged on 07/04/2022.  She followed up in clinic on July 16, 2022 where she been tolerating her apixaban  and beta-blockers without incident she was scheduled for cardioversion procedure after being on anticoagulant with 4 weeks uninterrupted.  She did previously undergone cardioversion that was successful for generalized vision she was noted to be back in atrial fibrillation.   She was last seen in clinic 11/30/2022 she continued to be tearful with complaints of being depressed, weak, fatigued, anxious, with multiple questions about her medications.  She was continued on apixaban  for CHA2DS2-VASc of at least 5 for stroke prophylaxis.  EKG revealed she was still in atrial fibrillation.  She was apprehensive about repeat cardioversion she was referred to EP.  She was seen in clinic by EP on 02/27/2023 discussed treatment options for atrial fibrillation  including antiarrhythmic drug therapy and ablation.  After discussing risk, recovery, and likelihood of success with the treatment strategy she had opted for the ablation procedure.  This was to be scheduled for 05/02/2023.[name]  She presented to the [facility] emergency department 1/23 with shortness of breath, hypoxic.  PCR swab was positive for RSV.  Chest x-ray concerning for pneumonia.  She continued to remain in sinus rhythm on presentation.  She was admitted overnight.  Since discharge she had an ongoing cough that improved with Mucinex .  She noticed a catching sensation in her chest and that she was back in A-fib.  She states she was diligent about taking her apixaban  twice daily and was scheduled for repeat cardioversion.  She was seen in clinic by EP on 05/30/2023 for routine 4-week post ablation appointment where she was in A-fib.  She was status post DCCV with return to sinus rhythm.  She was then followed up and was not aware of any A-fib episodes.  She does not radiate, or catching sensation in her checks.  Blood pressure has been well-controlled.  Upper respiratory infection with increased nasal drainage and some facial tenderness and cough.  She questions what medications that she can safely take at the time.  Continue to diligently take her apixaban  with no missed doses.  She was to continue to maintain sinus rhythm continue on metoprolol  75 mg twice daily and apixaban  5 mg twice daily.  For blood pressure she was continued on losartan  25 mg daily.   She returns to clinic today stating that she has been doing well from a cardiac perspective.  She continues to have occasional sensation which feels like she is having  to catch her breath.  States that she has been compliant with her current medications.  Has not missed any of her apixaban  doses.  Stated that she did have some mild bleeding from potential hemorrhoids and was previously prescribed hydrocortisone  cream and suppositories by her PCP which has  resolved the issue.  No other concerning issues noted today.  No recent hospitalizations or visits to the emergency department since her last visit in January where she had pneumonia and RSV.  ROS: 10 point review of system has been reviewed and considered negative exception ones been listed in the HPI  Studies Reviewed: [name]   EKG Interpretation Date/Time:  Wednesday July 24 2023 11:48:36 EDT Ventricular Rate:  85 PR Interval:  178 QRS Duration:  84 QT Interval:  370 QTC Calculation: 440 R Axis:   -25  Text Interpretation: Normal sinus rhythm Normal ECG When compared with ECG of 19-Jun-2023 11:23, No significant change was found Confirmed by Ronald Cockayne ([id number]) on 07/24/2023 11:52:54 AM    Cardiac CT, 04/18/2023 1. There is normal pulmonary vein drainage into the left atrium. (2 on the right and 2 on the left) with ostial measurements as above.  2. The left atrial appendage is a Windsock-cactus type with ostial size 29 x 23 mm and length 31 mm, Area 49 mm2. There is no thrombus in the left atrial appendage.  3. The esophagus runs in the left atrial midline and is not in the proximity to any of the pulmonary veins.  4. Coronary calcium  score of 331. This was 85th percentile for age/gender.     TTE, 07/11/2022  1. Left ventricular ejection fraction, by estimation, is 50 to 55%. The left ventricle has low normal function. The left ventricle has no regional wall motion abnormalities. There is mild left ventricular hypertrophy. Left ventricular diastolic  parameters are indeterminate.   2. Right ventricular systolic function is normal. The right ventricular size is normal. Tricuspid regurgitation signal is inadequate for assessing PA pressure.   3. Left atrial size was moderately dilated.   4. The mitral valve is normal in structure. No evidence of mitral valve regurgitation. No evidence of mitral stenosis.   5. The aortic valve is normal in structure. Aortic valve regurgitation is not  visualized. No aortic stenosis is present.  Risk Assessment/Calculations:    CHA2DS2-VASc Score = 4   This indicates a 4.8% annual risk of stroke. The patient's score is based upon: CHF History: 0 HTN History: 1 Diabetes History: 1 Stroke History: 0 Vascular Disease History: 0 Age Score: 1 Gender Score: 1            Physical Exam:   VS:  BP 138/72   Pulse 85   Ht 5\' 6"  (1.676 m)   Wt 210 lb (95.3 kg)   SpO2 94%   BMI 33.89 kg/m    Wt Readings from Last 3 Encounters:  07/24/23 210 lb (95.3 kg)  06/19/23 208 lb 9.6 oz (94.6 kg)  06/03/23 210 lb 1.6 oz (95.3 kg)    GEN: Well nourished, well developed in no acute distress NECK: No JVD; No carotid bruits CARDIAC: RRR, no murmurs, rubs, gallops RESPIRATORY:  Clear to auscultation without rales, wheezing or rhonchi  ABDOMEN: Soft, non-tender, non-distended EXTREMITIES:  No edema; No deformity   ASSESSMENT AND PLAN: .   Persistent atrial fibrillation status post DCCV x 2 and ablation procedure where she is maintaining sinus rhythm today with rate 85 with no acute changes noted from prior studies.  She is continued on apixaban  5 mg twice daily for CHA2DS2-VASc score of at least 4 for stroke prophylaxis she is currently on digoxin  0.25 mg daily being seen for a digoxin  level today as well as continued on metoprolol  tartrate 75 mg twice daily.  Primary hypertension blood pressure today 138/72.  Blood pressures remain stable.  She is continued on metoprolol  75 mg twice daily.  Encouraged to continue to monitor pressure 1 to 2 hours postmedication administration as well.  Type 2 diabetes where she has been continued on Rybelsus and metformin  as well as Jardiance .  Ongoing management per PCP.  Obesity with a BMI of 33.89.  Encouraged to continue to increase activity to help with weight loss  GAD which she is on several different medications have been titrated by psychiatry.  She has been encouraged to continue to follow-up with  psychiatry and counseling and maintain all follow-ups.       Dispo: Patient return to clinic to see MD/APP in 3 months or sooner if needed she is also been encouraged to continue follow-ups with EP.  Signed, Siniyah Evangelist, NP

## 2023-07-24 NOTE — Patient Instructions (Signed)
 Medication Instructions:  Your physician recommends that you continue on your current medications as directed. Please refer to the Current Medication list given to you today.  *If you need a refill on your cardiac medications before your next appointment, please call your pharmacy*  Lab Work: Your provider would like for you to have following labs drawn today Digoxin level.   If you have labs (blood work) drawn today and your tests are completely normal, you will receive your results only by: MyChart Message (if you have MyChart) OR A paper copy in the mail If you have any lab test that is abnormal or we need to change your treatment, we will call you to review the results.  Testing/Procedures: No test ordered today   Follow-Up: At [facility], you and your health needs are our priority.  As part of our continuing mission to provide you with exceptional heart care, our providers are all part of one team.  This team includes your primary Cardiologist (physician) and Advanced Practice Providers or APPs (Physician Assistants and Nurse Practitioners) who all work together to provide you with the care you need, when you need it.  Your next appointment:   3 month(s)  Provider:   You may see Lorine Bears, MD] or one of the following Advanced Practice Providers on your designated Care Team:   Nicolasa Ducking, NP Ames Dura, PA-C Eula Listen, PA-C Cadence Hugo, PA-C Charlsie Quest, NP Carlos Levering, NP

## 2023-07-25 LAB — DIGOXIN LEVEL: Digoxin, Serum: 0.6 ng/mL (ref 0.5–0.9)

## 2023-07-26 ENCOUNTER — Telehealth: Payer: Self-pay | Admitting: Pharmacist

## 2023-07-26 DIAGNOSIS — K581 Irritable bowel syndrome with constipation: Secondary | ICD-10-CM

## 2023-07-26 NOTE — Progress Notes (Addendum)
   07/26/2023  Patient ID: Lisa Gutierrez, female   DOB: [birth year], 73 y.o.   MRN: [id number]  Received incoming call from patient today with medication concerns.  Reports constant dizziness after taking morning medications, nightmares, and worsened depression lately. Told Dr. Edda Goo who wanted to see if it was related to Afib. At Afib visit, they said her heart was in normal rhythm currently.  Advised I will complete a medication interaction check and get input regarding potential causes from providers. Will try to respond to patient by Monday afternoon.   Nighttime meds: Metformin, Xanax, Eliquis, Lopressor, 2 stool softeners, Buspirone  Update from 07/30/23 follow-up call: - Patient plans to call Cardiology and re-schedule her visit for tomorrow due to severe allergies currently  - Advised to request refills of Losartan when she calls as well - For dizziness, advised that the two medications likely to cause dizziness is Losartan and Jardiance potentially  - Suggested switching these to either midday or bedtime to see if this makes a difference - Labs for [facility] on 08/06/23, so we will see if any issues identified within the labwork - Still searching for a PCP - For depression, will need to discuss with Dr. Edda Goo if having concerns- could consider more consistent use of Xanax at bedtime; for nightmares, could be from metoprolol tartrate - Advised to [facility] in [facility] to get medications lined up  - Will have short-fills for a few months, but will help with using her pillbox (re-discussed compliance packaging, but declined) - Will send Rx for Linzess to the pharmacy for now while waiting on [employer] response   **Severe dizziness at [employer] earlier today- made her upset; advised Rybelsus 14mg  could cause dizziness with an increase, but would prefer to switch the timing of other medications first before doing too many things at once  Only addition concern is that O2  saturation has been 94 to 95% at the last 2 visits, but has not been assessed for respiratory conditions    Delvin File, PharmD [facility]  Phone Number: [phone]

## 2023-07-26 NOTE — Progress Notes (Signed)
 Digoxin level is normal continue current medication regimen without changes needed at this time.

## 2023-07-30 MED ORDER — LINACLOTIDE 290 MCG PO CAPS
290.0000 ug | ORAL_CAPSULE | Freq: Every day | ORAL | 2 refills | Status: DC
Start: 2023-07-30 — End: 2024-01-02
  Filled 2023-12-05: qty 30, 30d supply, fill #0

## 2023-07-31 ENCOUNTER — Ambulatory Visit: Payer: PPO | Admitting: Cardiology

## 2023-07-31 ENCOUNTER — Telehealth: Payer: Self-pay | Admitting: Pharmacist

## 2023-07-31 ENCOUNTER — Telehealth: Payer: Self-pay | Admitting: Cardiovascular Disease

## 2023-07-31 ENCOUNTER — Ambulatory Visit: Payer: Self-pay

## 2023-07-31 MED ORDER — LOSARTAN POTASSIUM 25 MG PO TABS: 25.0000 mg | ORAL_TABLET | Freq: Every day | ORAL | 0 refills | Status: DC

## 2023-07-31 NOTE — Patient Instructions (Signed)
 Visit Information  Thank you for taking time to visit with me today. Please don't hesitate to contact me if I can be of assistance to you before our next scheduled appointment.  Your next care management appointment is by telephone on 09/04/23   at 1130 am  Telephone follow-up in 1 month  Please call the care guide team at [phone] if you need to cancel, schedule, or reschedule an appointment.   Please call 1-800-273-TALK (toll free, 24 hour hotline) if you are experiencing a Mental Health or Behavioral Health Crisis or need someone to talk to.  Augustin Leber RN, BSN, [facility], [facility]  Care Coordinator Phone: [phone]

## 2023-07-31 NOTE — Progress Notes (Signed)
   07/31/2023  Patient ID: Lisa Gutierrez, female   DOB: [birth year], 73 y.o.   MRN: [id number]  Called [employer]. They report the patient was approved for Linzess 290mcg, but just needed to schedule the delivery. Delivery scheduled- should arrive in 5 to 7 business days. Approved until 04/15/24.    Delvin File, PharmD [facility]  Phone Number: [phone]

## 2023-07-31 NOTE — Telephone Encounter (Signed)
 *  STAT* If patient is at the pharmacy, call can be transferred to refill team.   1. Which medications need to be refilled? (please list name of each medication and dose if known)   losartan (COZAAR) 25 MG tablet     2. Would you like to learn more about the convenience, safety, & potential cost savings by using the [facility]? No      3. Are you open to using the [facility] (Type [facility]. ). No   4. Which pharmacy/location (including street and city if local pharmacy) is medication to be sent to?  [id number] [place], [place] - [address]     5. Do they need a 30 day or 90 day supply? 90 days   Pt is out of meds

## 2023-07-31 NOTE — Patient Outreach (Signed)
 Complex Care Management   Visit Note  07/31/2023  Name:  Lisa Gutierrez MRN: [id number] DOB: Aug 23, 1950  Situation: Referral received for Complex Care Management related to  Dizziness  I obtained verbal consent from Patient.  Visit completed with patient  on the phone  Background:   Past Medical History:  Diagnosis Date   Anxiety    Atrial fibrillation (HCC)    Depression    Depression    Phreesia 07/02/2020   Diabetes mellitus without complication (HCC)    GERD (gastroesophageal reflux disease)    Hypertension    IBS (irritable bowel syndrome)    Neuromuscular disorder [facility])     Assessment: Lisa Gutierrez was unable to address her other medical conditions during today's appointment. Her primary concern is the dizziness she has been experiencing for the past few weeks. She suspects that this may be related to her medication and is actively collaborating with the pharmacy to identify the underlying issue. Patient Reported Symptoms:    Cognitive Cognitive Status: Able to follow simple commands, Alert and oriented to person, place, and time, Normal speech and language skills, Insightful and able to interpret abstract concepts      Neurological   Neurological Self-Management Outcome: 3 (uncertain) Neurological Comment: Working with pharmacy  HEENT HEENT Symptoms Reported: Not assessed      Cardiovascular      Respiratory Respiratory Symptoms Reported: Not assesed    Endocrine Patient reports the following symptoms related to hypoglycemia or hyperglycemia : Not assessed    Gastrointestinal Gastrointestinal Symptoms Reported: Not assessed      Genitourinary      Integumentary Integumentary Symptoms Reported: Not assessed    Musculoskeletal Musculoskelatal Symptoms Reviewed: Not assessed        Psychosocial Psychosocial Symptoms Reported: Not assessed            05/09/2023   10:46 AM  Depression screen PHQ 2/9  Decreased Interest 2  Down, Depressed,  Hopeless 2  PHQ - 2 Score 4  Altered sleeping 2  Tired, decreased energy 2  Change in appetite 2  Feeling bad or failure about yourself  2  Trouble concentrating 2  Moving slowly or fidgety/restless 0  Suicidal thoughts 0  PHQ-9 Score 14    There were no vitals filed for this visit.  Medications Reviewed Today     Reviewed by Augustin Leber, RN (Registered Nurse) on 07/31/23 at 1348  Med List Status: <None>   Medication Order Taking? Sig Documenting Provider Last Dose Status Informant  acetaminophen (TYLENOL) 500 MG tablet [id number] Yes Take 1,000 mg by mouth every 8 (eight) hours as needed for mild pain (pain score 1-3), moderate pain (pain score 4-6) or headache. [provider] Taking Active Self, Pharmacy Records           Med Note Saintclair Crape May 09, 2023  8:42 PM) prn  ALPRAZolam (XANAX) 0.5 MG tablet [id number] Yes Take 1 tablet (0.5 mg total) by mouth at bedtime as needed. Noreene Bearded, PA Taking Active   apixaban (ELIQUIS) 5 MG TABS tablet [id number] Yes Take 1 tablet (5 mg total) by mouth 2 (two) times daily. Riddle, Suzann, NP Taking Active   BLACK ELDERBERRY PO [id number] No Take 1 tablet by mouth daily.  Patient not taking: Reported on 07/31/2023   [provider] Not Taking Active Self, Pharmacy Records  Blood Glucose Monitoring Suppl (ONE TOUCH ULTRA 2) w/Device KIT [id number] Yes CHECK IN THE MORNING, AT NOON, AND  AT BEDTIME. MAY SUBSTITUTE TO ANY MANUFACTURER COVERED BY PATIENT'S INSURANCE. Noreene Bearded, PA Taking Active Self, Pharmacy Records  busPIRone (BUSPAR) 5 MG tablet [id number] Yes Take 1 tablet (5 mg total) by mouth daily AND 2 tablets (10 mg total) every evening. Todd Fossa, MD Taking Active   Calcium Carb-Cholecalciferol (CALCIUM 600/VITAMIN D3) 600-20 MG-MCG TABS [id number] Yes Take 1 tablet by mouth 2 (two) times daily. [provider] Taking Active Self, Pharmacy Records  Cholecalciferol (VITAMIN D3) 50 MCG  (2000 UT) capsule [id number] Yes Take 2,000 Units by mouth daily. [provider] Taking Active Self, Pharmacy Records  clobetasol ointment (TEMOVATE) 0.05 % [id number] Yes Apply topically 2 (two) times daily. Apply topically to affected twice daily until improved.  Patient taking differently: Apply 1 Application topically 2 (two) times daily as needed (progeria nodularis).   Boscia, Heather E, NP Taking Active Self, Pharmacy Records           Med Note Saintclair Crape May 09, 2023  8:40 PM) prn  digoxin (LANOXIN) 0.25 MG tablet [id number] Yes TAKE 1 TABLET BY MOUTH EVERY DAY Hammock, Sheri, NP Taking Active   Docusate Calcium (STOOL SOFTENER PO) [id number] Yes Take 2 tablets by mouth at bedtime. [provider] Taking Active Self, Pharmacy Records  empagliflozin (JARDIANCE) 25 MG TABS tablet [id number] Yes Take 1 tablet (25 mg total) by mouth daily before breakfast. Noreene Bearded, PA Taking Active Self, Pharmacy Records           Med Note Carylon Claude, JEANNETTA   Tue Apr 30, 2023 10:35 AM) On hold for procedure  Flaxseed, Linseed, (FLAXSEED OIL) 1400 MG CAPS [id number]  Take 1,400 mg by mouth 2 (two) times daily. [provider]  Active Self, Pharmacy Records  furosemide (LASIX) 20 MG tablet [id number] Yes TAKE 1 TABLET BY MOUTH EVERY DAY Hammock, Sheri, NP Taking Active   Glucose Blood (BLOOD GLUCOSE TEST STRIPS) STRP [id number] Yes 1 each by In Vitro route in the morning, at noon, and at bedtime. May substitute to any manufacturer covered by patient's insurance. Noreene Bearded, PA Taking Active Self, Pharmacy Records  glucose blood test strip [id number] Yes Use as instructed Noreene Bearded, PA Taking Active Self, Pharmacy Records  hydrocortisone (ANUSOL-HC) 25 MG suppository [id number] Yes Place 1 suppository (25 mg total) rectally 2 (two) times daily. Laneta Pintos, MD Taking Active   linaclotide [facility] [place]) 290 MCG CAPS capsule [id number] No Take 1 capsule (290  mcg total) by mouth daily before breakfast. Laneta Pintos, MD Unknown Active   LINZESS 145 MCG CAPS capsule [id number] Yes Take 1 capsule (145 mcg total) by mouth daily. Noreene Bearded, PA Taking Active   losartan (COZAAR) 25 MG tablet [id number] Yes Take 1 tablet (25 mg total) by mouth daily. Ronald Cockayne, NP Taking Active Self, Pharmacy Records  metFORMIN (GLUCOPHAGE-XR) 500 MG 24 hr tablet [id number] Yes TAKE 1 TABLET BY MOUTH TWICE A DAY WITH FOOD Maryclare Smoke A, PA Taking Active   metoprolol tartrate (LOPRESSOR) 50 MG tablet [id number] Yes TAKE 1.5 TABLETS BY MOUTH 2 TIMES DAILY. Ronald Cockayne, NP Taking Active   Multiple Vitamins-Minerals (MULTIVITAMIN ADULT PO) [id number] Yes Take 1 tablet by mouth daily. One a day [provider] Taking Active Self, Pharmacy Records  pantoprazole (PROTONIX) 40 MG tablet [id number] Yes TAKE 1 TABLET BY MOUTH TWICE A DAY Noreene Bearded, PA Taking Active Self, Pharmacy Records  RYBELSUS 14 MG TABS [id number] Yes Take 1 tablet  by mouth every morning. [provider] Taking Active   venlafaxine XR (EFFEXOR-XR) 150 MG 24 hr capsule [id number] Yes Take 1 capsule (150 mg total) by mouth daily with breakfast. Take total of 187.5 mg daily. Take along with 37.5 mg cap Hisada, Ivan Marion, MD Taking Active   venlafaxine XR (EFFEXOR-XR) 37.5 MG 24 hr capsule [id number] Yes Take 1 capsule (37.5 mg total) by mouth daily with breakfast. Take total of 187.5 mg daily. Take along with 150 mg cap Hisada, Reina, MD Taking Active             Recommendation:   PCP Follow-up Follow up with Pharmacy  Follow Up Plan:   Telephone follow-up in 1 month  Augustin Leber RN, BSN, [facility]  [facility], [facility]  Care Coordinator Phone: [phone]

## 2023-08-05 ENCOUNTER — Other Ambulatory Visit: Payer: Self-pay | Admitting: Family Medicine

## 2023-08-05 DIAGNOSIS — E1165 Type 2 diabetes mellitus with hyperglycemia: Secondary | ICD-10-CM

## 2023-08-06 DIAGNOSIS — E1165 Type 2 diabetes mellitus with hyperglycemia: Secondary | ICD-10-CM | POA: Diagnosis not present

## 2023-08-08 ENCOUNTER — Other Ambulatory Visit: Payer: Self-pay | Admitting: Pharmacist

## 2023-08-08 NOTE — Progress Notes (Signed)
   08/08/2023  Patient ID: Lisa Gutierrez, female   DOB: 10/09/1950, 73 y.o.   MRN: [id number]  Called and spoke with the patient on the phone today for medication management follow-up.  Hemorrhoids are doing better.  - Working on bowel regimen still and taking Miralax nightly with Linzess  290mcg daily.  - Waiting on [employer] shipment to arrive- will call me when it does. Should be here by Monday technically.  For Rybelsus constipation, meets with Endo soon. Will see how next A1c looks and discuss concerns.  Dizziness is doing better since switching Jardiance  and Losartan  to midday instead.  - Still dizziness, but not as severe.   Main concern now is getting more anxious and "mean" at night, along with nightmares.  - Continues taking Alprazolam  half-tablet at bedtime- helps her sleep easier.  - Concerns Buspirone  or Venlafaxine  are causing nightmares- advised more likely the Venlafaxie causing nightmares  - Advised to discuss GeneSight test to see best antidepressant/anxiety med to use  - Could consider Prazosin to help with nightmares and cut back on Venlafaxine  dose- discuss with Dr. Edda Goo  - Said Escitalopram  worked well before, but was worried about weight gain so switched (did NOT report weight gain, but was concerned from what other people told her and has seen it from others taking it)  - If main concerns are orthostatic, agitation, and weight gain best options are: Fluvoxamine, Vilazidone, Vortioxetine (Trintellix and Vilazidone covered for $100/month)- could consider [employer] Help at Hand PAP for Trintellix  - Next best options are Citalopram or Escitalopram  (SNRI have more agitation risk)   No changes today.    Delvin File, PharmD [facility]  Phone Number: [phone]

## 2023-08-13 DIAGNOSIS — E1165 Type 2 diabetes mellitus with hyperglycemia: Secondary | ICD-10-CM | POA: Diagnosis not present

## 2023-08-13 DIAGNOSIS — I152 Hypertension secondary to endocrine disorders: Secondary | ICD-10-CM | POA: Diagnosis not present

## 2023-08-13 DIAGNOSIS — E1159 Type 2 diabetes mellitus with other circulatory complications: Secondary | ICD-10-CM | POA: Diagnosis not present

## 2023-08-13 DIAGNOSIS — E1169 Type 2 diabetes mellitus with other specified complication: Secondary | ICD-10-CM | POA: Diagnosis not present

## 2023-08-13 DIAGNOSIS — E669 Obesity, unspecified: Secondary | ICD-10-CM | POA: Diagnosis not present

## 2023-08-17 NOTE — Progress Notes (Signed)
 Virtual Visit via Video Note  I connected with Lisa Gutierrez on 08/23/23 at 11:00 AM EDT by a video enabled telemedicine application and verified that I am speaking with the correct person using two identifiers.  Location: Patient: home Provider: home office Persons participated in the visit- patient, provider    I discussed the limitations of evaluation and management by telemedicine and the availability of in person appointments. The patient expressed understanding and agreed to proceed.   I discussed the assessment and treatment plan with the patient. The patient was provided an opportunity to ask questions and all were answered. The patient agreed with the plan and demonstrated an understanding of the instructions.   The patient was advised to call back or seek an in-person evaluation if the symptoms worsen or if the condition fails to improve as anticipated.   Todd Fossa, MD    [facility] MD/PA/NP OP Progress Note  08/23/2023 11:32 AM Lisa Gutierrez  MRN:  [id number]  Chief Complaint:  Chief Complaint  Patient presents with   Follow-up   HPI:  This is a follow-up appointment for depression and anxiety.  She states that she is so angry and mean.  She also feels depressed and sad.  She does not want to be around with others.  She feels anxious, shaking, and feels so ill.  Although she used to have a good reason to feel this way, she feels this way without any reason.  She states that she is a very nice person, and this is not like her.  She asks if this is due to medication.  She spends time, sitting, the day after they was nobody.  She states that her family does not care about her.  When she is asked about her grand daughter, she states that they are hateful.  She cannot believe while she raised them.  She also states that nobody cared when she was admitted, and she thought she almost died from virus.  She feels hurt all through her life, although she tries to help  others.  She has dreams about death/losses of her family.  She has insomnia.  She denies SI/HI.  She denies alcohol use or drug use.  She agrees with the plans as outlined below.   Daily routine: takes care of her dogs, cats, house chores Exercise: unable to do due to knee pain Employment:  Support: none (talks with her oldest granddaughter, who has left to college) Household: husband Marital status: married for 52 years  Number of children: 2 sons.   Visit Diagnosis:    ICD-10-CM   1. MDD (major depressive disorder), recurrent episode, moderate (HCC)  F33.1     2. GAD (generalized anxiety disorder)  F41.1       Past Psychiatric History: Please see initial evaluation for full details. I have reviewed the history. No updates at this time.     Past Medical History:  Past Medical History:  Diagnosis Date   Anxiety    Atrial fibrillation (HCC)    Depression    Depression    Phreesia 07/02/2020   Diabetes mellitus without complication (HCC)    GERD (gastroesophageal reflux disease)    Hypertension    IBS (irritable bowel syndrome)    Neuromuscular disorder (HCC)     Past Surgical History:  Procedure Laterality Date   ATRIAL FIBRILLATION ABLATION N/A 05/02/2023   Procedure: ATRIAL FIBRILLATION ABLATION;  Surgeon: Boyce Byes, MD;  Location: [facility];  Service: Cardiovascular;  Laterality: N/A;   BREAST EXCISIONAL BIOPSY     CARDIOVERSION N/A 09/03/2022   Procedure: CARDIOVERSION;  Surgeon: Wenona Hamilton, MD;  Location: [facility] ORS;  Service: Cardiovascular;  Laterality: N/A;   CARDIOVERSION N/A 06/03/2023   Procedure: CARDIOVERSION;  Surgeon: Constancia Delton, MD;  Location: [facility] ORS;  Service: Cardiovascular;  Laterality: N/A;   CATARACT EXTRACTION W/PHACO Right 07/24/2022   Procedure: CATARACT EXTRACTION PHACO AND INTRAOCULAR LENS PLACEMENT (IOC) RIGHT DIABETIC  5.93  00:42.5;  Surgeon: Clair Crews, MD;  Location: [facility];  Service:  Ophthalmology;  Laterality: Right;   CATARACT EXTRACTION W/PHACO Left 08/07/2022   Procedure: CATARACT EXTRACTION PHACO AND INTRAOCULAR LENS PLACEMENT (IOC) LEFT DIABETIC  12.33  00:58.3;  Surgeon: Clair Crews, MD;  Location: [facility];  Service: Ophthalmology;  Laterality: Left;   CESAREAN SECTION N/A    Phreesia 07/02/2020   CHOLECYSTECTOMY     EXCISION / BIOPSY BREAST / NIPPLE / DUCT Right 1973   duct removed   SPINE SURGERY N/A    Phreesia 07/02/2020   TUBAL LIGATION N/A    Phreesia 07/02/2020    Family Psychiatric History: Please see initial evaluation for full details. I have reviewed the history. No updates at this time.     Family History:  Family History  Problem Relation Age of Onset   Diabetes Mother    Hypertension Mother    Coronary artery disease Father    Glaucoma Father    Breast cancer Neg Hx     Social History:  Social History   Socioeconomic History   Marital status: Married    Spouse name: Not on file   Number of children: Not on file   Years of education: Not on file   Highest education level: Not on file  Occupational History   Not on file  Tobacco Use   Smoking status: Former    Passive exposure: Past   Smokeless tobacco: Never  Vaping Use   Vaping status: Never Used  Substance and Sexual Activity   Alcohol use: No    Alcohol/week: 0.0 standard drinks of alcohol   Drug use: Never   Sexual activity: Not Currently    Partners: Male  Other Topics Concern   Not on file  Social History Narrative   Not on file   Social Drivers of Health   Financial Resource Strain: Low Risk  (10/15/2022)   Overall Financial Resource Strain (CARDIA)    Difficulty of Paying Living Expenses: Not very hard  Food Insecurity: No Food Insecurity (10/15/2022)   Hunger Vital Sign    Worried About Running Out of Food in the Last Year: Never true    Ran Out of Food in the Last Year: Never true  Transportation Needs: No Transportation Needs (08/09/2022)    PRAPARE - Administrator, Civil Service (Medical): No    Lack of Transportation (Non-Medical): No  Physical Activity: Inactive (08/09/2022)   Exercise Vital Sign    Days of Exercise per Week: 0 days    Minutes of Exercise per Session: 0 min  Stress: Stress Concern Present (10/15/2022)   [employer] of Occupational Health - Occupational Stress Questionnaire    Feeling of Stress : Rather much  Social Connections: Socially Integrated (08/09/2022)   Social Connection and Isolation Panel [NHANES]    Frequency of Communication with Friends and Family: More than three times a week    Frequency of Social Gatherings with Friends and Family: More than three times a week  Attends Religious Services: More than 4 times per year    Active Member of Clubs or Organizations: Yes    Attends Banker Meetings: More than 4 times per year    Marital Status: Married    Allergies:  Allergies  Allergen Reactions   Aspirin Other (See Comments)    GI upset   Ibuprofen Other (See Comments)    GERD   Levofloxacin Other (See Comments)    Weight gain   Meloxicam Other (See Comments)    GI upset   Morphine Other (See Comments)   Naprosyn  [Naproxen] Other (See Comments)    GI upset   Nsaids Other (See Comments)    GI upset   Ozempic  (0.25 Or 0.5 Mg-Dose) [Semaglutide (0.25 Or 0.5mg -Dos)] Nausea And Vomiting   Propofol  Other (See Comments)    Had horrible nightmares   Quinolones    Robinul  [Glycopyrrolate] Nausea And Vomiting   Penicillin V Potassium Rash   Sulfa Antibiotics Rash    Metabolic Disorder Labs: Lab Results  Component Value Date   HGBA1C 9.2 (H) 03/26/2023   MPG 169 07/10/2022   No results found for: "PROLACTIN" Lab Results  Component Value Date   CHOL 163 03/26/2023   TRIG 340 (H) 03/26/2023   HDL 24 (L) 03/26/2023   CHOLHDL 6.8 (H) 03/26/2023   LDLCALC 83 03/26/2023   LDLCALC 123 (H) 08/24/2021   Lab Results  Component Value Date   TSH 0.814  03/26/2023   TSH 0.908 07/10/2022    Therapeutic Level Labs: No results found for: "LITHIUM" No results found for: "VALPROATE" No results found for: "CBMZ"  Current Medications: Current Outpatient Medications  Medication Sig Dispense Refill   acetaminophen  (TYLENOL ) 500 MG tablet Take 1,000 mg by mouth every 8 (eight) hours as needed for mild pain (pain score 1-3), moderate pain (pain score 4-6) or headache.     ALPRAZolam  (XANAX ) 0.5 MG tablet Take 1 tablet (0.5 mg total) by mouth at bedtime as needed. 30 tablet 1   apixaban  (ELIQUIS ) 5 MG TABS tablet Take 1 tablet (5 mg total) by mouth 2 (two) times daily. 60 tablet 11   BLACK ELDERBERRY PO Take 1 tablet by mouth daily.     Blood Glucose Monitoring Suppl (ONE TOUCH ULTRA 2) w/Device KIT CHECK IN THE MORNING, AT NOON, AND AT BEDTIME. MAY SUBSTITUTE TO ANY MANUFACTURER COVERED BY PATIENT'S INSURANCE. 300 kit 3   busPIRone  (BUSPAR ) 5 MG tablet Take 1 tablet (5 mg total) by mouth daily AND 2 tablets (10 mg total) every evening. (Patient taking differently: 5 mg BID) 90 tablet 0   Calcium  Carb-Cholecalciferol  (CALCIUM  600/VITAMIN D3) 600-20 MG-MCG TABS Take 1 tablet by mouth 2 (two) times daily.     Cholecalciferol  (VITAMIN D3) 50 MCG (2000 UT) capsule Take 2,000 Units by mouth daily.     clobetasol  ointment (TEMOVATE ) 0.05 % Apply topically 2 (two) times daily. Apply topically to affected twice daily until improved. (Patient taking differently: Apply 1 Application topically 2 (two) times daily as needed (progeria nodularis).) 30 g 2   digoxin  (LANOXIN ) 0.25 MG tablet TAKE 1 TABLET BY MOUTH EVERY DAY 90 tablet 3   Docusate Calcium  (STOOL SOFTENER PO) Take 2 tablets by mouth at bedtime.     empagliflozin  (JARDIANCE ) 25 MG TABS tablet Take 1 tablet (25 mg total) by mouth daily before breakfast. 90 tablet 1   Flaxseed, Linseed, (FLAXSEED OIL) 1400 MG CAPS Take 1,400 mg by mouth 2 (two) times daily.  furosemide  (LASIX ) 20 MG tablet TAKE 1 TABLET  BY MOUTH EVERY DAY 30 tablet 6   Glucose Blood (BLOOD GLUCOSE TEST STRIPS) STRP 1 each by In Vitro route in the morning, at noon, and at bedtime. May substitute to any manufacturer covered by patient's insurance. 100 strip 9   glucose blood test strip Use as instructed 100 each 12   hydrocortisone  (ANUSOL -HC) 25 MG suppository Place 1 suppository (25 mg total) rectally 2 (two) times daily. 12 suppository 1   linaclotide  (LINZESS ) 290 MCG CAPS capsule Take 1 capsule (290 mcg total) by mouth daily before breakfast. 30 capsule 2   LINZESS  145 MCG CAPS capsule Take 1 capsule (145 mcg total) by mouth daily. 30 capsule 2   losartan  (COZAAR ) 25 MG tablet Take 1 tablet (25 mg total) by mouth daily. 90 tablet 0   metFORMIN  (GLUCOPHAGE -XR) 500 MG 24 hr tablet TAKE 1 TABLET BY MOUTH TWICE A DAY WITH FOOD 60 tablet 1   metoprolol  tartrate (LOPRESSOR ) 50 MG tablet TAKE 1.5 TABLETS BY MOUTH 2 TIMES DAILY. 90 tablet 3   Multiple Vitamins-Minerals (MULTIVITAMIN ADULT PO) Take 1 tablet by mouth daily. One a day     pantoprazole  (PROTONIX ) 40 MG tablet TAKE 1 TABLET BY MOUTH TWICE A DAY 180 tablet 1   RYBELSUS 14 MG TABS Take 1 tablet by mouth every morning.     venlafaxine  XR (EFFEXOR -XR) 150 MG 24 hr capsule Take 1 capsule (150 mg total) by mouth daily with breakfast. Take total of 187.5 mg daily. Take along with 37.5 mg cap 90 capsule 0   venlafaxine  XR (EFFEXOR -XR) 37.5 MG 24 hr capsule Take 1 capsule (37.5 mg total) by mouth daily with breakfast. Take total of 187.5 mg daily. Take along with 150 mg cap 90 capsule 0   No current facility-administered medications for this visit.     Musculoskeletal: Strength & Muscle Tone: N/A Gait & Station: N/A Patient leans: N/A  Psychiatric Specialty Exam: Review of Systems  Psychiatric/Behavioral:  Positive for dysphoric mood and sleep disturbance. Negative for agitation, behavioral problems, confusion, decreased concentration, hallucinations, self-injury and  suicidal ideas. The patient is nervous/anxious. The patient is not hyperactive.   All other systems reviewed and are negative.   There were no vitals taken for this visit.There is no height or weight on file to calculate BMI.  General Appearance: Well Groomed  Eye Contact:  Good  Speech:  Clear and Coherent  Volume:  Normal  Mood:  Irritable  Affect:  Appropriate, Congruent, and tense  Thought Process:  Coherent  Orientation:  Full (Time, Place, and Person)  Thought Content: Logical   Suicidal Thoughts:  No  Homicidal Thoughts:  No  Memory:  Immediate;   Good  Judgement:  Good  Insight:  Fair  Psychomotor Activity:  Normal  Concentration:  Concentration: Good and Attention Span: Good  Recall:  Good  Fund of Knowledge: Good  Language: Good  Akathisia:  No  Handed:  Right  AIMS (if indicated): not done  Assets:  Communication Skills Desire for Improvement  ADL's:  Intact  Cognition: WNL  Sleep:  Fair   Screenings: GAD-7    Flowsheet Row Office Visit from 05/09/2023 in [facility] Visit from 12/27/2022 in [facility] Video Visit from 11/08/2022 in [facility] Visit from 09/25/2022 in [facility] at [facility] Erroneous Encounter from 07/18/2022 in [facility]  [facility] at [facility]  Total GAD-7 Score 15 17 15 12 13       Mini-Mental    Flowsheet Row Clinical Support from 02/12/2020 in [facility] Clinical Support from 02/06/2018 in [facility]  Total Score (max 30 points ) 29 30      PHQ2-9    Flowsheet Row Office Visit from 05/09/2023 in [facility] Visit from 12/27/2022 in [facility] at [facility] Video Visit from 11/08/2022 in [facility] at [facility] Coordination from 10/15/2022 in [facility] Coord[facility] Office Visit from  09/25/2022 in [facility] at [facility] Total Score 4 6 6 5 5   PHQ-9 Total Score 14 24 22 19 19       Flowsheet Row ED from 05/09/2023 in [facility] at [facility] Admission (Discharged) from 05/02/2023 in [facility] Admission (Discharged) from 08/07/2022 in [facility] PERIOP  C-SSRS RISK CATEGORY No Risk No Risk No Risk        Assessment and Plan:  Lisa Gutierrez is a 73 y.o. year old female with a history of depression, hypertension, diabetes, GERD, newly diagnosed Afib with RVR, who presents for follow up appointment for below.   1. MDD (major depressive disorder), recurrent episode, moderate (HCC) 2. GAD (generalized anxiety disorder) The patient reports chronic joint pain. Psychologically, she describes significant emotional strain, feeling unsupported and isolated. Socially, she states that her husband, son, and grandchildren are mean and hateful toward her and lack empathy or sympathy, though she denies any history of abuse. She reports limited emotional support and struggles with coping, often feeling overwhelmed and alone in managing her physical and emotional needs. History:  Tx from Dr. Deborra Falter    She reports worsening in irritability and anxiety since the last visit.  The care has been challenging due to her inconsistency in keeping appointments.  However, will try lowering the dose of buspirone , which has been up titrated a few months ago to see if it mitigates any possible adverse reaction.  Will consider adding prazosin in the future if she continues to experience heightened anxiety as she is likely re experiencing trauma in the context of current family dynamics.  Will continue current dose of venlafaxine  to target depression and anxiety.  She will greatly benefit from CBT.  She is on the waiting list.  Although she was also recommended for IOP, she would like to hold this  for now.    # insomnia - sleep eval long time ago, reportedly normal.  Unstable.  She complains of middle insomnia and daytime fatigue. Although she was strongly encouraged to consider a sleep evaluation, she declined at this.  Will make the above intervention as outlined above.      Plan Continue venlafaxine  187.5 mg daily- monitor weight gain Decrease Buspar  5 mg twice a day-  reduced by 5 mg due to concern of possible adverse reaction. 10 mg BID caused drowsiness Next appointment : 5/16 at 11 am, video - on xanax  0.5 mg at night as needed for anxiety, prescribed by primary care   Past trials of medication: sertraline, citalopram, lexapro , fluoxetine , bupropion  (headache)   The patient demonstrates the following risk factors for suicide: Chronic risk factors for suicide include: psychiatric disorder of depression and chronic pain. Acute risk factors for suicide include: family or marital conflict.  Protective factors for this patient include: hope for the future. Considering these factors, the overall suicide risk at this point appe  Collaboration of Care: Collaboration of Care: Other reviewed notes in Epic  Patient/Guardian was advised Release of Information must be obtained prior to any record release in order to collaborate their care with an outside provider. Patient/Guardian was advised if they have not already done so to contact the registration department to sign all necessary forms in order for us  to release information regarding their care.   Consent: Patient/Guardian gives verbal consent for treatment and assignment of benefits for services provided during this visit. Patient/Guardian expressed understanding and agreed to proceed.    Todd Fossa, MD 08/23/2023, 11:32 AM

## 2023-08-21 ENCOUNTER — Ambulatory Visit: Attending: Cardiology | Admitting: Cardiology

## 2023-08-21 VITALS — BP 140/72 | HR 79 | Ht 66.0 in | Wt 207.0 lb

## 2023-08-21 DIAGNOSIS — I480 Paroxysmal atrial fibrillation: Secondary | ICD-10-CM

## 2023-08-21 DIAGNOSIS — D6869 Other thrombophilia: Secondary | ICD-10-CM

## 2023-08-21 DIAGNOSIS — I10 Essential (primary) hypertension: Secondary | ICD-10-CM

## 2023-08-21 NOTE — Patient Instructions (Signed)
 Medication Instructions:  The current medical regimen is effective;  continue present plan and medications as directed. Please refer to the Current Medication list given to you today.   *If you need a refill on your cardiac medications before your next appointment, please call your pharmacy*   Follow-Up: At [facility], you and your health needs are our priority.  As part of our continuing mission to provide you with exceptional heart care, our providers are all part of one team.  This team includes your primary Cardiologist (physician) and Advanced Practice Providers or APPs (Physician Assistants and Nurse Practitioners) who all work together to provide you with the care you need, when you need it.  Your next appointment:   As needed  Provider:   Suzann Riddle, NP    We recommend signing up for the patient portal called "MyChart".  Sign up information is provided on this After Visit Summary.  MyChart is used to connect with patients for Virtual Visits (Telemedicine).  Patients are able to view lab/test results, encounter notes, upcoming appointments, etc.  Non-urgent messages can be sent to your provider as well.   To learn more about what you can do with MyChart, go to [email or web address].

## 2023-08-21 NOTE — Progress Notes (Signed)
 Electrophysiology Clinic Note    Date:  08/21/2023  Patient ID:  Lisa, Gutierrez 23-Apr-1950, MRN [id number] PCP:  Noreene Bearded, PA  Cardiologist:  Antionette Kirks, MD Electrophysiologist: Boyce Byes, MD    Discussed the use of AI scribe software for clinical note transcription with the patient, who gave verbal consent to proceed.   Patient Profile    Chief Complaint: AF ablation follow-up  History of Present Illness: Lisa Gutierrez is a 72 y.o. female with PMH notable for Afib, HTN, T2DM, anxiety, IBS; seen today for Boyce Byes, MD for routine electrophysiology followup.  She is s/p AFib ablation with PVI, posterior wall on 05/02/2023. She presented to ER 1/23 with SOB, hypoxic. CXR concerning for PNA. She was in sinus rhythm at presentation. She was admitted overnight.  I saw her 2/13 for routine 4 wk post-ablation appt where she was in AFib. She is s/p DCCV with return to SR. I saw her for DCCV follow-up 3/5 where she was in sinus rhtyhm.   On follow-up today, she is doing well without cardiac complaints. She brings BP/pulse log where BP readings are 120-130s systolic, pulse in the 50-70s. She denies chest pain, chest pressure, palpations. NO increased swelling. Continues to take eliquis  BID, no bleeding concerns.   She is very thankful and appreciate of her care.    AAD History: None     ROS:  Please see the history of present illness. All other systems are reviewed and otherwise negative.    Physical Exam    VS:  BP (!) 140/72 (BP Location: Left Arm, Patient Position: Sitting, Cuff Size: Normal)   Pulse 79   Ht 5\' 6"  (1.676 m)   Wt 207 lb (93.9 kg)   SpO2 94%   BMI 33.41 kg/m  BMI: Body mass index is 33.41 kg/m.  Wt Readings from Last 3 Encounters:  08/21/23 207 lb (93.9 kg)  07/24/23 210 lb (95.3 kg)  06/19/23 208 lb 9.6 oz (94.6 kg)     GEN- The patient is well appearing, alert and oriented x 3 today.   Lungs- Clear  to ausculation bilaterally, normal work of breathing.  Heart- Regular rate and rhythm, no murmurs, rubs or gallops Extremities- No peripheral edema, warm, dry    Studies Reviewed   Previous EP, cardiology notes.    EKG is ordered. Personal review of EKG from today shows:    EKG Interpretation Date/Time:  Wednesday Aug 21 2023 13:21:20 EDT Ventricular Rate:  79 PR Interval:  176 QRS Duration:  86 QT Interval:  374 QTC Calculation: 428 R Axis:   -29  Text Interpretation: Normal sinus rhythm Normal ECG Confirmed by Abu Heavin [phone]) on 08/21/2023 1:24:04 PM    Cardiac CT, 04/18/2023 1. There is normal pulmonary vein drainage into the left atrium. (2 on the right and 2 on the left) with ostial measurements as above.  2. The left atrial appendage is a Windsock-cactus type with ostial size 29 x 23 mm and length 31 mm, Area 49 mm2. There is no thrombus in the left atrial appendage.  3. The esophagus runs in the left atrial midline and is not in the proximity to any of the pulmonary veins.  4. Coronary calcium  score of 331. This was 85th percentile for age/gender.   TTE, 07/11/2022  1. Left ventricular ejection fraction, by estimation, is 50 to 55%. The left ventricle has low normal function. The left ventricle has no regional wall motion  abnormalities. There is mild left ventricular hypertrophy. Left ventricular diastolic  parameters are indeterminate.   2. Right ventricular systolic function is normal. The right ventricular size is normal. Tricuspid regurgitation signal is inadequate for assessing PA pressure.   3. Left atrial size was moderately dilated.   4. The mitral valve is normal in structure. No evidence of mitral valve regurgitation. No evidence of mitral stenosis.   5. The aortic valve is normal in structure. Aortic valve regurgitation is not visualized. No aortic stenosis is present.     Assessment and Plan     #) parox AFib S/p AF ablation 04/2023 by Dr.  Marven Slimmer Maintaining sinus rhythm Continue 75mg  lopressor  BID   #) Hypercoag d/t parox afib CHA2DS2-VASc Score = at least 4 [CHF History: 0, HTN History: 1, Diabetes History: 1, Stroke History: 0, Vascular Disease History: 0, Age Score: 1, Gender Score: 1].  Therefore, the patient's annual risk of stroke is 4.8 %.    Stroke ppx - 5mg  eliquis  BID, appropriately dosed No bleeding concerns  #) HTN Well controlled by home measurements Continue 25mg  losartan  Continue lopressor  as above       Current medicines are reviewed at length with the patient today.   The patient does not have concerns regarding her medicines.  The following changes were made today:  none  Labs/ tests ordered today include:  Orders Placed This Encounter  Procedures   EKG 12-Lead     Disposition: Follow up with Dr. Marven Slimmer or EP APP  PRN   Continue regular follow-up with gen cards as scheduled   Signed, Lisa Grubb, NP  08/21/23  1:39 PM  Electrophysiology [facility]

## 2023-08-23 ENCOUNTER — Telehealth (INDEPENDENT_AMBULATORY_CARE_PROVIDER_SITE_OTHER): Payer: Self-pay | Admitting: Psychiatry

## 2023-08-23 ENCOUNTER — Encounter: Payer: Self-pay | Admitting: Psychiatry

## 2023-08-23 DIAGNOSIS — F411 Generalized anxiety disorder: Secondary | ICD-10-CM

## 2023-08-23 DIAGNOSIS — F331 Major depressive disorder, recurrent, moderate: Secondary | ICD-10-CM

## 2023-08-23 NOTE — Patient Instructions (Signed)
 Continue venlafaxine  187.5 mg daily Decrease buspar  5 mg twice a day Next appointment : 5/16 at 11 am

## 2023-08-25 NOTE — Progress Notes (Deleted)
 [facility] MD/PA/NP OP Progress Note  08/25/2023 10:51 AM Lisa Gutierrez  MRN:  [id number]  Chief Complaint: No chief complaint on file.  HPI: ***  Daily routine: takes care of her dogs, cats, house chores Exercise: unable to do due to knee pain Employment:  Support: none (talks with her oldest granddaughter, who has left to college) Household: husband Marital status: married for 52 years  Number of children: 2 sons.   Visit Diagnosis: No diagnosis found.  Past Psychiatric History: Please see initial evaluation for full details. I have reviewed the history. No updates at this time.     Past Medical History:  Past Medical History:  Diagnosis Date   Anxiety    Atrial fibrillation (HCC)    Depression    Depression    Phreesia 07/02/2020   Diabetes mellitus without complication (HCC)    GERD (gastroesophageal reflux disease)    Hypertension    IBS (irritable bowel syndrome)    Neuromuscular disorder (HCC)     Past Surgical History:  Procedure Laterality Date   ATRIAL FIBRILLATION ABLATION N/A 05/02/2023   Procedure: ATRIAL FIBRILLATION ABLATION;  Surgeon: Boyce Byes, MD;  Location: [facility];  Service: Cardiovascular;  Laterality: N/A;   BREAST EXCISIONAL BIOPSY     CARDIOVERSION N/A 09/03/2022   Procedure: CARDIOVERSION;  Surgeon: Wenona Hamilton, MD;  Location: [facility];  Service: Cardiovascular;  Laterality: N/A;   CARDIOVERSION N/A 06/03/2023   Procedure: CARDIOVERSION;  Surgeon: Constancia Delton, MD;  Location: [facility];  Service: Cardiovascular;  Laterality: N/A;   CATARACT EXTRACTION W/PHACO Right 07/24/2022   Procedure: CATARACT EXTRACTION PHACO AND INTRAOCULAR LENS PLACEMENT (IOC) RIGHT DIABETIC  5.93  00:42.5;  Surgeon: Clair Crews, MD;  Location: [facility];  Service: Ophthalmology;  Laterality: Right;   CATARACT EXTRACTION W/PHACO Left 08/07/2022   Procedure: CATARACT EXTRACTION PHACO AND INTRAOCULAR LENS PLACEMENT (IOC) LEFT  DIABETIC  12.33  00:58.3;  Surgeon: Clair Crews, MD;  Location: [facility];  Service: Ophthalmology;  Laterality: Left;   CESAREAN SECTION N/A    Phreesia 07/02/2020   CHOLECYSTECTOMY     EXCISION / BIOPSY BREAST / NIPPLE / DUCT Right 1973   duct removed   SPINE SURGERY N/A    Phreesia 07/02/2020   TUBAL LIGATION N/A    Phreesia 07/02/2020    Family Psychiatric History: Please see initial evaluation for full details. I have reviewed the history. No updates at this time.     Family History:  Family History  Problem Relation Age of Onset   Diabetes Mother    Hypertension Mother    Coronary artery disease Father    Glaucoma Father    Breast cancer Neg Hx     Social History:  Social History   Socioeconomic History   Marital status: Married    Spouse name: Not on file   Number of children: Not on file   Years of education: Not on file   Highest education level: Not on file  Occupational History   Not on file  Tobacco Use   Smoking status: Former    Passive exposure: Past   Smokeless tobacco: Never  Vaping Use   Vaping status: Never Used  Substance and Sexual Activity   Alcohol use: No    Alcohol/week: 0.0 standard drinks of alcohol   Drug use: Never   Sexual activity: Not Currently    Partners: Male  Other Topics Concern   Not on file  Social History Narrative  Not on file   Social Drivers of Health   Financial Resource Strain: Low Risk  (10/15/2022)   Overall Financial Resource Strain (CARDIA)    Difficulty of Paying Living Expenses: Not very hard  Food Insecurity: No Food Insecurity (10/15/2022)   Hunger Vital Sign    Worried About Running Out of Food in the Last Year: Never true    Ran Out of Food in the Last Year: Never true  Transportation Needs: No Transportation Needs (08/09/2022)   PRAPARE - Administrator, Civil Service (Medical): No    Lack of Transportation (Non-Medical): No  Physical Activity: Inactive (08/09/2022)    Exercise Vital Sign    Days of Exercise per Week: 0 days    Minutes of Exercise per Session: 0 min  Stress: Stress Concern Present (10/15/2022)   [employer] of Occupational Health - Occupational Stress Questionnaire    Feeling of Stress : Rather much  Social Connections: Socially Integrated (08/09/2022)   Social Connection and Isolation Panel [NHANES]    Frequency of Communication with Friends and Family: More than three times a week    Frequency of Social Gatherings with Friends and Family: More than three times a week    Attends Religious Services: More than 4 times per year    Active Member of [employer] or Organizations: Yes    Attends Engineer, structural: More than 4 times per year    Marital Status: Married    Allergies:  Allergies  Allergen Reactions   Aspirin Other (See Comments)    GI upset   Ibuprofen Other (See Comments)    GERD   Levofloxacin Other (See Comments)    Weight gain   Meloxicam Other (See Comments)    GI upset   Morphine Other (See Comments)   Naprosyn  [Naproxen] Other (See Comments)    GI upset   Nsaids Other (See Comments)    GI upset   Ozempic  (0.25 Or 0.5 Mg-Dose) [Semaglutide (0.25 Or 0.5mg -Dos)] Nausea And Vomiting   Propofol  Other (See Comments)    Had horrible nightmares   Quinolones    Robinul  [Glycopyrrolate] Nausea And Vomiting   Penicillin V Potassium Rash   Sulfa Antibiotics Rash    Metabolic Disorder Labs: Lab Results  Component Value Date   HGBA1C 9.2 (H) 03/26/2023   MPG 169 07/10/2022   No results found for: "PROLACTIN" Lab Results  Component Value Date   CHOL 163 03/26/2023   TRIG 340 (H) 03/26/2023   HDL 24 (L) 03/26/2023   CHOLHDL 6.8 (H) 03/26/2023   LDLCALC 83 03/26/2023   LDLCALC 123 (H) 08/24/2021   Lab Results  Component Value Date   TSH 0.814 03/26/2023   TSH 0.908 07/10/2022    Therapeutic Level Labs: No results found for: "LITHIUM" No results found for: "VALPROATE" No results found for:  "CBMZ"  Current Medications: Current Outpatient Medications  Medication Sig Dispense Refill   acetaminophen  (TYLENOL ) 500 MG tablet Take 1,000 mg by mouth every 8 (eight) hours as needed for mild pain (pain score 1-3), moderate pain (pain score 4-6) or headache.     ALPRAZolam  (XANAX ) 0.5 MG tablet Take 1 tablet (0.5 mg total) by mouth at bedtime as needed. 30 tablet 1   apixaban  (ELIQUIS ) 5 MG TABS tablet Take 1 tablet (5 mg total) by mouth 2 (two) times daily. 60 tablet 11   BLACK ELDERBERRY PO Take 1 tablet by mouth daily.     Blood Glucose Monitoring Suppl (ONE TOUCH  ULTRA 2) w/Device KIT CHECK IN THE MORNING, AT NOON, AND AT BEDTIME. MAY SUBSTITUTE TO ANY MANUFACTURER COVERED BY PATIENT'S INSURANCE. 300 kit 3   busPIRone  (BUSPAR ) 5 MG tablet Take 1 tablet (5 mg total) by mouth daily AND 2 tablets (10 mg total) every evening. (Patient taking differently: 5 mg BID) 90 tablet 0   Calcium  Carb-Cholecalciferol  (CALCIUM  600/VITAMIN D3) 600-20 MG-MCG TABS Take 1 tablet by mouth 2 (two) times daily.     Cholecalciferol  (VITAMIN D3) 50 MCG (2000 UT) capsule Take 2,000 Units by mouth daily.     clobetasol  ointment (TEMOVATE ) 0.05 % Apply topically 2 (two) times daily. Apply topically to affected twice daily until improved. (Patient taking differently: Apply 1 Application topically 2 (two) times daily as needed (progeria nodularis).) 30 g 2   digoxin  (LANOXIN ) 0.25 MG tablet TAKE 1 TABLET BY MOUTH EVERY DAY 90 tablet 3   Docusate Calcium  (STOOL SOFTENER PO) Take 2 tablets by mouth at bedtime.     empagliflozin  (JARDIANCE ) 25 MG TABS tablet Take 1 tablet (25 mg total) by mouth daily before breakfast. 90 tablet 1   Flaxseed, Linseed, (FLAXSEED OIL) 1400 MG CAPS Take 1,400 mg by mouth 2 (two) times daily.     furosemide  (LASIX ) 20 MG tablet TAKE 1 TABLET BY MOUTH EVERY DAY 30 tablet 6   Glucose Blood (BLOOD GLUCOSE TEST STRIPS) STRP 1 each by In Vitro route in the morning, at noon, and at bedtime. May  substitute to any manufacturer covered by patient's insurance. 100 strip 9   glucose blood test strip Use as instructed 100 each 12   hydrocortisone  (ANUSOL -HC) 25 MG suppository Place 1 suppository (25 mg total) rectally 2 (two) times daily. 12 suppository 1   linaclotide  (LINZESS ) 290 MCG CAPS capsule Take 1 capsule (290 mcg total) by mouth daily before breakfast. 30 capsule 2   LINZESS  145 MCG CAPS capsule Take 1 capsule (145 mcg total) by mouth daily. 30 capsule 2   losartan  (COZAAR ) 25 MG tablet Take 1 tablet (25 mg total) by mouth daily. 90 tablet 0   metFORMIN  (GLUCOPHAGE -XR) 500 MG 24 hr tablet TAKE 1 TABLET BY MOUTH TWICE A DAY WITH FOOD 60 tablet 1   metoprolol  tartrate (LOPRESSOR ) 50 MG tablet TAKE 1.5 TABLETS BY MOUTH 2 TIMES DAILY. 90 tablet 3   Multiple Vitamins-Minerals (MULTIVITAMIN ADULT PO) Take 1 tablet by mouth daily. One a day     pantoprazole  (PROTONIX ) 40 MG tablet TAKE 1 TABLET BY MOUTH TWICE A DAY 180 tablet 1   RYBELSUS 14 MG TABS Take 1 tablet by mouth every morning.     venlafaxine  XR (EFFEXOR -XR) 150 MG 24 hr capsule Take 1 capsule (150 mg total) by mouth daily with breakfast. Take total of 187.5 mg daily. Take along with 37.5 mg cap 90 capsule 0   venlafaxine  XR (EFFEXOR -XR) 37.5 MG 24 hr capsule Take 1 capsule (37.5 mg total) by mouth daily with breakfast. Take total of 187.5 mg daily. Take along with 150 mg cap 90 capsule 0   No current facility-administered medications for this visit.     Musculoskeletal: Strength & Muscle Tone: N/A Gait & Station: N/A Patient leans: N/A  Psychiatric Specialty Exam: Review of Systems  There were no vitals taken for this visit.There is no height or weight on file to calculate BMI.  General Appearance: {Appearance:22683}  Eye Contact:  {BHH EYE CONTACT:22684}  Speech:  Clear and Coherent  Volume:  Normal  Mood:  {BHH MOOD:22306}  Affect:  {Affect (PAA):22687}  Thought Process:  Coherent  Orientation:  Full (Time, Place,  and Person)  Thought Content: Logical   Suicidal Thoughts:  {ST/HT (PAA):22692}  Homicidal Thoughts:  {ST/HT (PAA):22692}  Memory:  Immediate;   Good  Judgement:  {Judgement (PAA):22694}  Insight:  {Insight (PAA):22695}  Psychomotor Activity:  Normal  Concentration:  Concentration: Good and Attention Span: Good  Recall:  Good  Fund of Knowledge: Good  Language: Good  Akathisia:  No  Handed:  Right  AIMS (if indicated): not done  Assets:  Communication Skills Desire for Improvement  ADL's:  Intact  Cognition: WNL  Sleep:  {BHH GOOD/FAIR/POOR:22877}   Screenings: GAD-7    Flowsheet Row Office Visit from 05/09/2023 in [facility] at [facility] Visit from 12/27/2022 in [facility] at [facility] Video Visit from 11/08/2022 in [facility] at [facility] Visit from 09/25/2022 in [facility] Care at [facility] Erroneous Encounter from 07/18/2022 in [facility] Primary [facility] at [facility]  Total GAD-7 Score 15 17 15 12 13       Mini-Mental    Flowsheet Row Clinical Support from 02/12/2020 in [facility] Clinical Support from 02/06/2018 in [facility], [facility]  Total Score (max 30 points ) 29 30      PHQ2-9    Flowsheet Row Office Visit from 05/09/2023 in [facility] Primary [facility] at [facility] Office Visit from 12/27/2022 in [facility] Primary Care at [facility] Video Visit from 11/08/2022 in [facility] Primary Care at [facility] Coordination from 10/15/2022 in [facility] Care Coordination Office Visit from 09/25/2022 in [facility] Care at [facility]  PHQ-2 Total Score 4 6 6 5 5   PHQ-9 Total Score 14 24 22 19 19       Flowsheet Row ED from 05/09/2023 in [facility] Emergency Department at [facility] Admission (Discharged) from 05/02/2023 in [facility] Admission (Discharged) from 08/07/2022 in [facility] SURGICAL  CENTER PERIOP  C-SSRS RISK CATEGORY No Risk No Risk No Risk        Assessment and Plan:  Lisa Gutierrez is a 73 y.o. year old female with a history of depression, hypertension, diabetes, GERD, newly diagnosed Afib with RVR, who presents for follow up appointment for below.    1. MDD (major depressive disorder), recurrent episode, moderate (HCC) 2. GAD (generalized anxiety disorder) The patient reports chronic joint pain. Psychologically, she describes significant emotional strain, feeling unsupported and isolated. Socially, she states that her husband, son, and grandchildren are mean and hateful toward her and lack empathy or sympathy, though she denies any history of abuse. She reports limited emotional support and struggles with coping, often feeling overwhelmed and alone in managing her physical and emotional needs. History:  Tx from Dr. Deborra Falter    She reports worsening in irritability and anxiety since the last visit.  The care has been challenging due to her inconsistency in keeping appointments.  However, will try lowering the dose of buspirone , which has been up titrated a few months ago to see if it mitigates any possible adverse reaction.  Will consider adding prazosin in the future if she continues to experience heightened anxiety as she is likely re experiencing trauma in the context of current family dynamics.  Will continue current dose of venlafaxine  to target depression and anxiety.  She will greatly benefit from CBT.  She is on the waiting list.  Although she was also recommended for IOP, she would like to hold this for now.    # insomnia - sleep eval long time ago, reportedly normal.  Unstable.  She complains of middle insomnia and daytime fatigue. Although she was strongly encouraged to consider a sleep evaluation, she declined at this.  Will make the above intervention as outlined above.        Plan Continue venlafaxine  187.5 mg daily- monitor weight gain Decrease  Buspar  5 mg twice a day-  reduced by 5 mg due to concern of possible adverse reaction. 10 mg BID caused drowsiness Next appointment : 5/16 at 11 am, video - on xanax  0.5 mg at night as needed for anxiety, prescribed by primary care    Past trials of medication: sertraline, citalopram, lexapro , fluoxetine , bupropion  (headache)   The patient demonstrates the following risk factors for suicide: Chronic risk factors for suicide include: psychiatric disorder of depression and chronic pain. Acute risk factors for suicide include: family or marital conflict. Protective factors for this patient include: hope for the future. Considering these factors, the overall suicide risk at this point appe  Collaboration of Care: Collaboration of Care: [facility] OP Collaboration of LKGM:[id number]}  Patient/Guardian was advised Release of Information must be obtained prior to any record release in order to collaborate their care with an outside provider. Patient/Guardian was advised if they have not already done so to contact the registration department to sign all necessary forms in order for us  to release information regarding their care.   Consent: Patient/Guardian gives verbal consent for treatment and assignment of benefits for services provided during this visit. Patient/Guardian expressed understanding and agreed to proceed.    Todd Fossa, MD 08/25/2023, 10:51 AM

## 2023-08-27 DIAGNOSIS — I480 Paroxysmal atrial fibrillation: Secondary | ICD-10-CM

## 2023-08-28 ENCOUNTER — Ambulatory Visit: Attending: Cardiology

## 2023-08-28 DIAGNOSIS — I480 Paroxysmal atrial fibrillation: Secondary | ICD-10-CM

## 2023-08-29 ENCOUNTER — Telehealth: Payer: Self-pay | Admitting: Emergency Medicine

## 2023-08-29 NOTE — Telephone Encounter (Signed)
 I just got off he phone with pt. She says her car is in the shop and wants to know if Arlyce Berger has any other availability later today or tomorrow. I told her that Arlyce Berger "made" this 2 pm slot today for her but I'll check. Pt she if she has to she can call her husband today to get off work or pt reports she will have her car tomorrow.

## 2023-08-29 NOTE — Telephone Encounter (Signed)
 Pt called in stating she is returning a call to [employer]

## 2023-08-29 NOTE — Telephone Encounter (Signed)
 Called and spoke with patient. Patient states that she has already spoken with someone and all questions were answered.

## 2023-08-29 NOTE — Telephone Encounter (Signed)
 Pt is asking for a call back

## 2023-08-29 NOTE — Telephone Encounter (Signed)
 Pt called back and appt made for 5/27 10:55 per [facility] instruction

## 2023-08-30 ENCOUNTER — Telehealth: Admitting: Psychiatry

## 2023-08-31 NOTE — Progress Notes (Signed)
 Virtual Visit via Video Note  I connected with Lisa Gutierrez on 09/04/23 at  2:00 PM EDT by a video enabled telemedicine application and verified that I am speaking with the correct person using two identifiers.  Location: Patient: home Provider: home office Persons participated in the visit- patient, provider    I discussed the limitations of evaluation and management by telemedicine and the availability of in person appointments. The patient expressed understanding and agreed to proceed.    I discussed the assessment and treatment plan with the patient. The patient was provided an opportunity to ask questions and all were answered. The patient agreed with the plan and demonstrated an understanding of the instructions.   The patient was advised to call back or seek an in-person evaluation if the symptoms worsen or if the condition fails to improve as anticipated.   Todd Fossa, MD    [facility] MD/PA/NP OP Progress Note  09/04/2023 2:28 PM Lisa Gutierrez  MRN:  [id number]  Chief Complaint:  Chief Complaint  Patient presents with   Follow-up   HPI:  This is a follow-up appointment for depression, anxiety and PTSD.  She states that her mood has been up and down.  She continues to cry.  Although she wants to help animals, who are hurt, she cannot take them due to the way she is doing.  Although she continues to feel angry and short tempered, it is not as bad compared to before.  She tends to feel more anxious at night.  She has a fear that what is going to be dropped next.  She feels that she was traumatized, referring to the recent admission secondary to A-fib.  She was told she could not have cataract surgery due to palpitation.  She did not understand what Afib is, and why she has that condition.  She thought she was going to die.  Although her dizziness and weakness has improved, it has come back again.  She will be seen by cardiologist in a few days.  She has nightmares  of having stroke, and nobody cares for her, and she dies.  This reminds her of her mother, who died on her lap.  It was horrible experience, and she feels traumatic.  Although she reports frustration against her family, she agrees to try focusing on her care at this time.  She attends church online.  Although some people might contact her, she was to be by herself.  She does not want others to be sorry for her, as there are other people who needs help.  She enjoys taking care of her animals, and smiles. She denies SI, HI.  She agrees with the plans as outlined below.   Daily routine: takes care of her 3 dogs, 5 cats, house chores Exercise: unable to do due to knee pain Employment:  Support: none (talks with her oldest granddaughter, who has left to college) Household: husband Marital status: married for 52 years  Number of children: 2 sons.   Visit Diagnosis:    ICD-10-CM   1. PTSD (post-traumatic stress disorder)  F43.10     2. MDD (major depressive disorder), recurrent episode, moderate (HCC)  F33.1     3. GAD (generalized anxiety disorder)  F41.1       Past Psychiatric History: Please see initial evaluation for full details. I have reviewed the history. No updates at this time.     Past Medical History:  Past Medical History:  Diagnosis Date   Anxiety  Atrial fibrillation (HCC)    Depression    Depression    Phreesia 07/02/2020   Diabetes mellitus without complication (HCC)    GERD (gastroesophageal reflux disease)    Hypertension    IBS (irritable bowel syndrome)    Neuromuscular disorder (HCC)     Past Surgical History:  Procedure Laterality Date   ATRIAL FIBRILLATION ABLATION N/A 05/02/2023   Procedure: ATRIAL FIBRILLATION ABLATION;  Surgeon: Boyce Byes, MD;  Location: [facility];  Service: Cardiovascular;  Laterality: N/A;   BREAST EXCISIONAL BIOPSY     CARDIOVERSION N/A 09/03/2022   Procedure: CARDIOVERSION;  Surgeon: Wenona Hamilton, MD;   Location: [facility] ORS;  Service: Cardiovascular;  Laterality: N/A;   CARDIOVERSION N/A 06/03/2023   Procedure: CARDIOVERSION;  Surgeon: Constancia Delton, MD;  Location: [facility] ORS;  Service: Cardiovascular;  Laterality: N/A;   CATARACT EXTRACTION W/PHACO Right 07/24/2022   Procedure: CATARACT EXTRACTION PHACO AND INTRAOCULAR LENS PLACEMENT (IOC) RIGHT DIABETIC  5.93  00:42.5;  Surgeon: Clair Crews, MD;  Location: [facility];  Service: Ophthalmology;  Laterality: Right;   CATARACT EXTRACTION W/PHACO Left 08/07/2022   Procedure: CATARACT EXTRACTION PHACO AND INTRAOCULAR LENS PLACEMENT (IOC) LEFT DIABETIC  12.33  00:58.3;  Surgeon: Clair Crews, MD;  Location: [facility];  Service: Ophthalmology;  Laterality: Left;   CESAREAN SECTION N/A    Phreesia 07/02/2020   CHOLECYSTECTOMY     EXCISION / BIOPSY BREAST / NIPPLE / DUCT Right 1973   duct removed   SPINE SURGERY N/A    Phreesia 07/02/2020   TUBAL LIGATION N/A    Phreesia 07/02/2020    Family Psychiatric History: Please see initial evaluation for full details. I have reviewed the history. No updates at this time.     Family History:  Family History  Problem Relation Age of Onset   Diabetes Mother    Hypertension Mother    Coronary artery disease Father    Glaucoma Father    Breast cancer Neg Hx     Social History:  Social History   Socioeconomic History   Marital status: Married    Spouse name: Not on file   Number of children: Not on file   Years of education: Not on file   Highest education level: Not on file  Occupational History   Not on file  Tobacco Use   Smoking status: Former    Passive exposure: Past   Smokeless tobacco: Never  Vaping Use   Vaping status: Never Used  Substance and Sexual Activity   Alcohol use: No    Alcohol/week: 0.0 standard drinks of alcohol   Drug use: Never   Sexual activity: Not Currently    Partners: Male  Other Topics Concern   Not on file  Social History  Narrative   Not on file   Social Drivers of Health   Financial Resource Strain: Low Risk  (10/15/2022)   Overall Financial Resource Strain (CARDIA)    Difficulty of Paying Living Expenses: Not very hard  Food Insecurity: No Food Insecurity (10/15/2022)   Hunger Vital Sign    Worried About Running Out of Food in the Last Year: Never true    Ran Out of Food in the Last Year: Never true  Transportation Needs: No Transportation Needs (08/09/2022)   PRAPARE - Administrator, Civil Service (Medical): No    Lack of Transportation (Non-Medical): No  Physical Activity: Inactive (08/09/2022)   Exercise Vital Sign    Days of Exercise  per Week: 0 days    Minutes of Exercise per Session: 0 min  Stress: Stress Concern Present (10/15/2022)   [employer] of Occupational Health - Occupational Stress Questionnaire    Feeling of Stress : Rather much  Social Connections: Socially Integrated (08/09/2022)   Social Connection and Isolation Panel [NHANES]    Frequency of Communication with Friends and Family: More than three times a week    Frequency of Social Gatherings with Friends and Family: More than three times a week    Attends Religious Services: More than 4 times per year    Active Member of [employer] or Organizations: Yes    Attends Engineer, structural: More than 4 times per year    Marital Status: Married    Allergies:  Allergies  Allergen Reactions   Aspirin Other (See Comments)    GI upset   Ibuprofen Other (See Comments)    GERD   Levofloxacin Other (See Comments)    Weight gain   Meloxicam Other (See Comments)    GI upset   Morphine Other (See Comments)   Naprosyn  [Naproxen] Other (See Comments)    GI upset   Nsaids Other (See Comments)    GI upset   Ozempic  (0.25 Or 0.5 Mg-Dose) [Semaglutide (0.25 Or 0.5mg -Dos)] Nausea And Vomiting   Propofol  Other (See Comments)    Had horrible nightmares   Quinolones    Robinul  [Glycopyrrolate] Nausea And Vomiting    Penicillin V Potassium Rash   Sulfa Antibiotics Rash    Metabolic Disorder Labs: Lab Results  Component Value Date   HGBA1C 9.2 (H) 03/26/2023   MPG 169 07/10/2022   No results found for: "PROLACTIN" Lab Results  Component Value Date   CHOL 163 03/26/2023   TRIG 340 (H) 03/26/2023   HDL 24 (L) 03/26/2023   CHOLHDL 6.8 (H) 03/26/2023   LDLCALC 83 03/26/2023   LDLCALC 123 (H) 08/24/2021   Lab Results  Component Value Date   TSH 0.814 03/26/2023   TSH 0.908 07/10/2022    Therapeutic Level Labs: No results found for: "LITHIUM" No results found for: "VALPROATE" No results found for: "CBMZ"  Current Medications: Current Outpatient Medications  Medication Sig Dispense Refill   acetaminophen  (TYLENOL ) 500 MG tablet Take 1,000 mg by mouth every 8 (eight) hours as needed for mild pain (pain score 1-3), moderate pain (pain score 4-6) or headache.     ALPRAZolam  (XANAX ) 0.5 MG tablet Take 1 tablet (0.5 mg total) by mouth at bedtime as needed. 30 tablet 1   apixaban  (ELIQUIS ) 5 MG TABS tablet Take 1 tablet (5 mg total) by mouth 2 (two) times daily. 60 tablet 11   BLACK ELDERBERRY PO Take 1 tablet by mouth daily.     Blood Glucose Monitoring Suppl (ONE TOUCH ULTRA 2) w/Device KIT CHECK IN THE MORNING, AT NOON, AND AT BEDTIME. MAY SUBSTITUTE TO ANY MANUFACTURER COVERED BY PATIENT'S INSURANCE. 300 kit 3   Calcium  Carb-Cholecalciferol  (CALCIUM  600/VITAMIN D3) 600-20 MG-MCG TABS Take 1 tablet by mouth 2 (two) times daily.     Cholecalciferol  (VITAMIN D3) 50 MCG (2000 UT) capsule Take 2,000 Units by mouth daily.     clobetasol  ointment (TEMOVATE ) 0.05 % Apply topically 2 (two) times daily. Apply topically to affected twice daily until improved. (Patient taking differently: Apply 1 Application topically 2 (two) times daily as needed (progeria nodularis).) 30 g 2   digoxin  (LANOXIN ) 0.25 MG tablet TAKE 1 TABLET BY MOUTH EVERY DAY 90 tablet 3  Docusate Calcium  (STOOL SOFTENER PO) Take 2 tablets  by mouth at bedtime.     empagliflozin  (JARDIANCE ) 25 MG TABS tablet Take 1 tablet (25 mg total) by mouth daily before breakfast. 90 tablet 1   Flaxseed, Linseed, (FLAXSEED OIL) 1400 MG CAPS Take 1,400 mg by mouth 2 (two) times daily.     furosemide  (LASIX ) 20 MG tablet TAKE 1 TABLET BY MOUTH EVERY DAY 30 tablet 6   Glucose Blood (BLOOD GLUCOSE TEST STRIPS) STRP 1 each by In Vitro route in the morning, at noon, and at bedtime. May substitute to any manufacturer covered by patient's insurance. 100 strip 9   glucose blood test strip Use as instructed 100 each 12   hydrocortisone  (ANUSOL -HC) 25 MG suppository Place 1 suppository (25 mg total) rectally 2 (two) times daily. 12 suppository 1   linaclotide  (LINZESS ) 290 MCG CAPS capsule Take 1 capsule (290 mcg total) by mouth daily before breakfast. 30 capsule 2   LINZESS  145 MCG CAPS capsule Take 1 capsule (145 mcg total) by mouth daily. 30 capsule 2   losartan  (COZAAR ) 25 MG tablet Take 1 tablet (25 mg total) by mouth daily. 90 tablet 0   metFORMIN  (GLUCOPHAGE -XR) 500 MG 24 hr tablet TAKE 1 TABLET BY MOUTH TWICE A DAY WITH FOOD 60 tablet 1   metoprolol  tartrate (LOPRESSOR ) 50 MG tablet TAKE 1.5 TABLETS BY MOUTH 2 TIMES DAILY. 90 tablet 3   Multiple Vitamins-Minerals (MULTIVITAMIN ADULT PO) Take 1 tablet by mouth daily. One a day     pantoprazole  (PROTONIX ) 40 MG tablet TAKE 1 TABLET BY MOUTH TWICE A DAY 180 tablet 1   RYBELSUS 14 MG TABS Take 1 tablet by mouth every morning.     venlafaxine  XR (EFFEXOR -XR) 150 MG 24 hr capsule Take 1 capsule (150 mg total) by mouth daily with breakfast. Take total of 187.5 mg daily. Take along with 37.5 mg cap 90 capsule 0   venlafaxine  XR (EFFEXOR -XR) 37.5 MG 24 hr capsule Take 1 capsule (37.5 mg total) by mouth daily with breakfast. Take total of 187.5 mg daily. Take along with 150 mg cap 90 capsule 0   No current facility-administered medications for this visit.     Musculoskeletal: Strength & Muscle Tone:  N/A Gait & Station: N/A Patient leans: N/A  Psychiatric Specialty Exam: Review of Systems  Psychiatric/Behavioral:  Positive for dysphoric mood and sleep disturbance. Negative for agitation, behavioral problems, confusion, decreased concentration, hallucinations, self-injury and suicidal ideas. The patient is nervous/anxious. The patient is not hyperactive.   All other systems reviewed and are negative.   There were no vitals taken for this visit.There is no height or weight on file to calculate BMI.  General Appearance: Well Groomed  Eye Contact:  Good  Speech:  Clear and Coherent  Volume:  Normal  Mood:  Anxious  Affect:  Appropriate, Congruent, and Restricted  Thought Process:  Coherent  Orientation:  Full (Time, Place, and Person)  Thought Content: Logical   Suicidal Thoughts:  No  Homicidal Thoughts:  No  Memory:  Immediate;   Good  Judgement:  Good  Insight:  Good  Psychomotor Activity:  Normal  Concentration:  Concentration: Good and Attention Span: Good  Recall:  Good  Fund of Knowledge: Good  Language: Good  Akathisia:  No  Handed:  Right  AIMS (if indicated): not done  Assets:  Communication Skills Desire for Improvement  ADL's:  Intact  Cognition: WNL  Sleep:  Poor   Screenings: GAD-7  Flowsheet Row Office Visit from 05/09/2023 in [facility] Visit from 12/27/2022 in [facility] at [facility] Video Visit from 11/08/2022 in [facility] at [facility] Visit from 09/25/2022 in [facility] Primary Care at [facility] Erroneous Encounter from 07/18/2022 in [facility] Primary Care at [facility]  Total GAD-7 Score 15 17 15 12 13       Mini-Mental    Flowsheet Row Clinical Support from 02/12/2020 [facility] Clinical Support from 02/06/2018 in [facility]  Total Score (max 30 points ) 29 30      PHQ2-9    Flowsheet Row Office Visit from 05/09/2023 in [facility] at [facility] Visit from 12/27/2022 in [facility] Care at [facility] Video Visit from 11/08/2022 in [facility] Primary Care at [facility] Coordination from 10/15/2022 in [facility] Care Coordination Office Visit from 09/25/2022 in [facility] Primary Care at [facility]  PHQ-2 Total Score 4 6 6 5 5   PHQ-9 Total Score 14 24 22 19 19       Flowsheet Row ED from 05/09/2023 in [facility] Emergency Department at [facility] Admission (Discharged) from 05/02/2023 in [facility] Admission (Discharged) from 08/07/2022 in [facility] PERIOP  C-SSRS RISK CATEGORY No Risk No Risk No Risk        Assessment and Plan:  Ladajah Soltys is a 73 y.o. year old female with a history of depression, hypertension, diabetes, GERD, newly diagnosed Afib with RVR, who presents for follow up appointment for below.   1. PTSD (post-traumatic stress disorder) 2. MDD (major depressive disorder), recurrent episode, moderate (HCC) 3. GAD (generalized anxiety disorder) The patient reports chronic joint pain. Psychologically, she describes significant emotional strain, feeling unsupported and isolated. Socially, she states that her husband, son, and grandchildren are mean and hateful toward her and lack empathy or sympathy, though she denies any history of abuse. She reports limited emotional support and struggles with coping, often feeling overwhelmed and alone in managing her physical and emotional needs. History:  Tx from Dr. Deborra Falter     The exam is notable for less irritability, lability since tapering down BuSpar .  She now experiences PTSD symptoms from recent admission, including Afib, and intrusive memories of her mother's passing, which occurred in her lap.  Although she may benefit from prazosin in the future, she experiences dizziness, which may be secondary to A-fib.  She agrees to stay on the  current medication regimen until she completes cardiac evaluation.  Will continue venlafaxine  to target PTSD, depression and anxiety.  Will continue BuSpar  for anxiety. She will greatly benefit from CBT.  She is on the waiting list.  Although she was also recommended for IOP, she would like to hold this for now.   # insomnia - sleep eval long time ago, reportedly normal.  Unstable.  She complains of middle insomnia and daytime fatigue. Although she was strongly encouraged to consider a sleep evaluation, she declined at this.  Will make the above intervention as outlined above.      Plan Continue venlafaxine  187.5 mg daily- monitor weight gain Continue Buspar  5 mg twice a day-  reduced by 5 mg in May 2025 due to concern of possible adverse reaction.  Next appointment : 6/24 at 1 pm, video - on xanax  0.5 mg at night as  needed for anxiety, prescribed by primary care    Past trials of medication: sertraline, citalopram, lexapro , fluoxetine , bupropion  (headache)   The patient demonstrates the following risk factors for suicide: Chronic risk factors for suicide include: psychiatric disorder of depression and chronic pain. Acute risk factors for suicide include: family or marital conflict. Protective factors for this patient include: hope for the future. Considering these factors, the overall suicide risk at this point appe  Collaboration of Care: Collaboration of Care: Other reviewed notes in Epic  Patient/Guardian was advised Release of Information must be obtained prior to any record release in order to collaborate their care with an outside provider. Patient/Guardian was advised if they have not already done so to contact the registration department to sign all necessary forms in order for us  to release information regarding their care.   Consent: Patient/Guardian gives verbal consent for treatment and assignment of benefits for services provided during this visit. Patient/Guardian expressed  understanding and agreed to proceed.    Todd Fossa, MD 09/04/2023, 2:28 PM

## 2023-09-01 ENCOUNTER — Other Ambulatory Visit: Payer: Self-pay | Admitting: Psychiatry

## 2023-09-04 ENCOUNTER — Encounter: Payer: Self-pay | Admitting: Psychiatry

## 2023-09-04 ENCOUNTER — Telehealth (INDEPENDENT_AMBULATORY_CARE_PROVIDER_SITE_OTHER): Admitting: Psychiatry

## 2023-09-04 ENCOUNTER — Telehealth: Payer: Self-pay

## 2023-09-04 DIAGNOSIS — F4310 Post-traumatic stress disorder, unspecified: Secondary | ICD-10-CM

## 2023-09-04 DIAGNOSIS — F331 Major depressive disorder, recurrent, moderate: Secondary | ICD-10-CM | POA: Diagnosis not present

## 2023-09-04 DIAGNOSIS — F411 Generalized anxiety disorder: Secondary | ICD-10-CM

## 2023-09-04 NOTE — Patient Instructions (Signed)
 Continue venlafaxine  187.5 mg daily  Continue Buspar  5 mg twice a day  Next appointment : 6/24 at 1 pm

## 2023-09-05 ENCOUNTER — Other Ambulatory Visit: Payer: Self-pay | Admitting: Pharmacist

## 2023-09-05 NOTE — Progress Notes (Signed)
   09/05/2023  Patient ID: Lisa Gutierrez, female   DOB: [birth year], 73 y.o.   MRN: [id number]  Called and spoke with the patient on the phone today regarding medications changes and concerns. Still looking for new PCP at this time.  Main concern currently is Afib with increased HR. Has appointment on Tuesday to discuss concerns. Sent monitor back as she was worried it will cause more anxiety wearing at night and wondering what it says.   Received Linzess  highest dose shipment from [employer]. Linzess  and Miralax at night has helped with constipation, that was potentially related to the Rybelsus.  A1c came back better at 7.8% with Endocrinology. Will continue medications as they are currently.  For anxiety, decreased the Buspirone  from 2 tablets at bedtime to 1 tablet at bedtime. Still having anxiety and dizziness. Switched Jardiance  and Metoprolol  back to the morning for now as well. Feels confused all the time and mood swings from angry to sad- doesn't "feel like herself" anymore. Will focus on these changes after Cardiology visit.  Briefly discussed Neuriva for memory. Advised she can try it for 1 month and see if there is any benefit, but can be expensive. Will likely hold off for now.   Plan: - Follow-up on 10/16/23 for med review + comprehensive check-in - Get answers with Cardiology regarding tachycardia monitored - Will consider using heart monitor again if they strongly suggest it - Psychiatry will work on med changes after heart concerns addressed- Afib can be exacerbating concerns   Delvin File, PharmD [facility]  Phone Number: [phone]

## 2023-09-10 ENCOUNTER — Ambulatory Visit: Attending: Cardiology | Admitting: Cardiology

## 2023-09-10 ENCOUNTER — Encounter: Payer: Self-pay | Admitting: Cardiology

## 2023-09-10 ENCOUNTER — Ambulatory Visit: Payer: Self-pay

## 2023-09-10 VITALS — BP 140/78 | HR 84 | Ht 65.0 in | Wt 208.0 lb

## 2023-09-10 DIAGNOSIS — D6869 Other thrombophilia: Secondary | ICD-10-CM | POA: Diagnosis not present

## 2023-09-10 DIAGNOSIS — I10 Essential (primary) hypertension: Secondary | ICD-10-CM

## 2023-09-10 DIAGNOSIS — I480 Paroxysmal atrial fibrillation: Secondary | ICD-10-CM | POA: Diagnosis not present

## 2023-09-10 DIAGNOSIS — E1165 Type 2 diabetes mellitus with hyperglycemia: Secondary | ICD-10-CM

## 2023-09-10 DIAGNOSIS — I4891 Unspecified atrial fibrillation: Secondary | ICD-10-CM | POA: Diagnosis not present

## 2023-09-10 DIAGNOSIS — F411 Generalized anxiety disorder: Secondary | ICD-10-CM

## 2023-09-10 DIAGNOSIS — E669 Obesity, unspecified: Secondary | ICD-10-CM | POA: Diagnosis not present

## 2023-09-10 MED ORDER — METOPROLOL TARTRATE 100 MG PO TABS
100.0000 mg | ORAL_TABLET | Freq: Two times a day (BID) | ORAL | 3 refills | Status: DC
Start: 2023-09-10 — End: 2023-11-15

## 2023-09-10 NOTE — Patient Instructions (Addendum)
 Medication Instructions:  Your physician recommends the following medication changes.   INCREASE: Metoprolol  to 100 mg twice daily  *If you need a refill on your cardiac medications before your next appointment, please call your pharmacy*  Lab Work: Your provider would like for you to have following labs drawn today CBC, BMP, MAG, TSH.   If you have labs (blood work) drawn today and your tests are completely normal, you will receive your results only by: MyChart Message (if you have MyChart) OR A paper copy in the mail If you have any lab test that is abnormal or we need to change your treatment, we will call you to review the results.  Testing/Procedures: No test ordered today   Follow-Up: At [facility], you and your health needs are our priority.  As part of our continuing mission to provide you with exceptional heart care, our providers are all part of one team.  This team includes your primary Cardiologist (physician) and Advanced Practice Providers or APPs (Physician Assistants and Nurse Practitioners) who all work together to provide you with the care you need, when you need it.  Your next appointment:   Keep July Appointment  Provider:   Antionette Kirks, MD or Ronald Cockayne, NP    We recommend signing up for the patient portal called "MyChart".  Sign up information is provided on this After Visit Summary.  MyChart is used to connect with patients for Virtual Visits (Telemedicine).  Patients are able to view lab/test results, encounter notes, upcoming appointments, etc.  Non-urgent messages can be sent to your provider as well.   To learn more about what you can do with MyChart, go to [email or web address].   Other Instructions Ambulatory referral to [facility]

## 2023-09-10 NOTE — Progress Notes (Signed)
 Cardiology Office Note:  .   Date:  09/10/2023  ID:  Lisa Gutierrez, DOB 05-07-1950, MRN [id number] PCP: Noreene Bearded, PA  [facility] Providers Cardiologist:  Antionette Kirks, MD Electrophysiologist:  Boyce Byes, MD    History of Present Illness: .   Lisa Gutierrez is a 73 y.o. female with a past medical history of essential hypertension, chronic electrolyte abnormalities, GERD, IBS with constipation, chronic superficial gastritis without bleeding, fatty liver disease, type 2 diabetes, GAD, insomnia, chronic history of statin intolerance, morbid obesity, persistent atrial fibrillation status post DCCV x 2 and ablation, who is here today for follow-up.   She had previously presented for cataract surgery on 07/10/2022 was found to be in atrial fibrillation with RVR by anesthesia.  Heart rate was 1 70-1 9 past time and procedure was subsequently canceled.  Echocardiogram revealed an LVEF of 50-55%, no RWMA, no valvular abnormalities were noted.  She was started on metoprolol , dig, and apixaban  and subsequently discharged on 07/04/2022.  She followed up in clinic on July 16, 2022 where she been tolerating her apixaban  and beta-blockers without incident she was scheduled for cardioversion procedure after being on anticoagulant with 4 weeks uninterrupted.  She did previously undergone cardioversion that was successful for generalized vision she was noted to be back in atrial fibrillation.   She was last seen in clinic 11/30/2022 she continued to be tearful with complaints of being depressed, weak, fatigued, anxious, with multiple questions about her medications.  She was continued on apixaban  for CHA2DS2-VASc of at least 5 for stroke prophylaxis.  EKG revealed she was still in atrial fibrillation.  She was apprehensive about repeat cardioversion she was referred to EP.  She was seen in clinic by EP on 02/27/2023 discussed treatment options for atrial fibrillation including  antiarrhythmic drug therapy and ablation.  After discussing risk, recovery, and likelihood of success with the treatment strategy she had opted for the ablation procedure.  This was to be scheduled for 05/02/2023.[name]  She presented to the [name] emergency department 1/23 with shortness of breath, hypoxic.  PCR swab was positive for RSV.  Chest x-ray concerning for pneumonia.  She continued to remain in sinus rhythm on presentation.  She was admitted overnight.  Since discharge she had an ongoing cough that improved with Mucinex .  She noticed a catching sensation in her chest and that she was back in A-fib.  She states she was diligent about taking her apixaban  twice daily and was scheduled for repeat cardioversion.  She was seen in clinic by EP on 05/30/2023 for routine 4-week post ablation appointment where she was in A-fib.  She was status post DCCV with return to sinus rhythm.  She was then followed up and was not aware of any A-fib episodes.  She does not radiate, or catching sensation in her checks.  Blood pressure has been well-controlled.  Upper respiratory infection with increased nasal drainage and some facial tenderness and cough.  She questions what medications that she can safely take at the time.  Continue to diligently take her apixaban  with no missed doses.  She was to continue to maintain sinus rhythm continue on metoprolol  75 mg twice daily and apixaban  5 mg twice daily.  For blood pressure she was continued on losartan  25 mg daily.  She was seen in clinic/9/25 states she been doing well from a car perspective.  She continues to have occasional sensation which only she was unable to catch her breath.  States  she been compliant with her current medication regimen.  She was sent for an updated digoxin  level and was continued on her current medication regimen with no further testing that was ordered at that time.   She was last seen in clinic 08/21/2023 by EP following up for A-fib ablation.  At that  time she was doing well with no cardiac complaints.  Blood pressure low blood pressure ranged 120-130 systolic pulse 50-70.  She continues to take her apixaban  twice daily with no issues of bleeding.  There were no medication changes that were made and no further testing that was ordered.  She returns to clinic today tearful and upset.  She states that she has called the triage several times with elevated blood pressures and heart rates and having increasing fatigue and shortness of breath as well as palpitations.  Patient recently had undergone 2 successful cardioversions and then had ablation procedure and was previously maintaining sinus rhythm at the beginning of the month at her EP appointment.  She states that she notified that she had converted back to atrial fibrillation when all of her symptoms started again.  She is saying that her anxiety is worse.  Is hard to differentiate with anxiety triggered her A-fib or if A-fib triggered her anxiety.  She has been suffering from both for an extended period of time.  Unfortunately she has not had any of her medications prior to her visit today.  She denies any chest pain, peripheral edema, lightheadedness or dizziness.  Denies any bleeding.  States that she has not missed any doses of her apixaban .  There is no blood noted in her urine or stool.  She also states that she has been compliant with her current medication regimen without any undue side effects.  Denies any hospitalizations or visits to the emergency department.  ROS: 10 point review of system has been reviewed and considered negative except ones were listed in the HPI  Studies Reviewed: [name]   EKG Interpretation Date/Time:  Tuesday Sep 10 2023 11:43:29 EDT Ventricular Rate:  125 PR Interval:    QRS Duration:  80 QT Interval:  322 QTC Calculation: 464 R Axis:   -86  Text Interpretation: Atrial fibrillation with rapid ventricular response Left axis deviation Pulmonary disease pattern  Nonspecific ST abnormality When compared with ECG of 21-Aug-2023 13:21, Atrial fibrillation has replaced Sinus rhythm Vent. rate has increased BY  46 BPM Confirmed by Ronald Cockayne ([id number]) on 09/10/2023 11:44:15 AM    Cardiac CT, 04/18/2023 1. There is normal pulmonary vein drainage into the left atrium. (2 on the right and 2 on the left) with ostial measurements as above.  2. The left atrial appendage is a Windsock-cactus type with ostial size 29 x 23 mm and length 31 mm, Area 49 mm2. There is no thrombus in the left atrial appendage.  3. The esophagus runs in the left atrial midline and is not in the proximity to any of the pulmonary veins.  4. Coronary calcium  score of 331. This was 85th percentile for age/gender.     TTE, 07/11/2022  1. Left ventricular ejection fraction, by estimation, is 50 to 55%. The left ventricle has low normal function. The left ventricle has no regional wall motion abnormalities. There is mild left ventricular hypertrophy. Left ventricular diastolic  parameters are indeterminate.   2. Right ventricular systolic function is normal. The right ventricular size is normal. Tricuspid regurgitation signal is inadequate for assessing PA pressure.   3. Left atrial size  was moderately dilated.   4. The mitral valve is normal in structure. No evidence of mitral valve regurgitation. No evidence of mitral stenosis.   5. The aortic valve is normal in structure. Aortic valve regurgitation is not visualized. No aortic stenosis is present. Risk Assessment/Calculations:    CHA2DS2-VASc Score = 4   This indicates a 4.8% annual risk of stroke. The patient's score is based upon: CHF History: 0 HTN History: 1 Diabetes History: 1 Stroke History: 0 Vascular Disease History: 0 Age Score: 1 Gender Score: 1         Physical Exam:   VS:  BP (!) 140/78 (BP Location: Left Arm)   Pulse 84   Ht 5\' 5"  (1.651 m)   Wt 208 lb (94.3 kg)   SpO2 96%   BMI 34.61 kg/m    Wt Readings from  Last 3 Encounters:  09/10/23 208 lb (94.3 kg)  08/21/23 207 lb (93.9 kg)  07/24/23 210 lb (95.3 kg)    GEN: Well nourished, well developed in no acute distress NECK: No JVD; No carotid bruits CARDIAC: IR IR, no murmurs, rubs, gallops RESPIRATORY:  Clear to auscultation without rales, wheezing or rhonchi  ABDOMEN: Soft, non-tender, non-distended EXTREMITIES:  No edema; No deformity   ASSESSMENT AND PLAN: .   Persistent atrial fibrillation status post DCCV x 2 and ablation procedure.  She called in with complaints of fatigue, palpitations, shortness of breath and EKG today reveals she is back in atrial fibrillation with a rate of 125 left intrafascicular block nonspecific ST and T wave changes.  Patient recently undergone DCCV x 2 and ablation where she had previously been maintaining sinus rhythm echo 08/21/2023.  Her metoprolol  has been increased to 100 mg twice daily.  She is continued on apixaban  5 mg twice daily for CHA2DS2-VASc score of at least 4 for stroke prophylaxis as well as digoxin  0.25 mg with the last sentence level being stable.  She is also being sent for labs today of CBC to rule out anemia, BMP to rule out electrolyte and kidney dysfunction, magnesium , and TSH.  Primary hypertension with blood pressure today 140/78.  Patient should not have any of her medications today so she is slightly elevated.  Metoprolol  was been increased to 100 mg twice daily and she is continued on furosemide  20 mg as needed.  She has been encouraged to continue to monitor pressure 1 to 2 hours postmedication administration as well.  Type 2 diabetes where she is continued on Rybelsus, metformin , and Jardiance .  Ongoing management per PCP.  Obesity with a BMI of 34.61.  Makes prognosis difficult.  Encouraged to continue with increasing activity and dietary modifications for weight loss.  GAD which she is on several different medications has been treated by psychiatry.  Atrial fibrillation could be  exacerbating her anxiety as she is extremely symptomatic.  She has been encouraged to continue to follow-up with psychiatry and counseling maintain all follow-ups and take medications as prescribed.       Dispo: Patient return to clinic as previously scheduled appointment in July and an updated appointment is being made with EP.  Patient will need to discuss antiarrhythmics versus repeat ablation procedure with EP as she is extremely symptomatic  Signed, Eusebio Blazejewski, NP

## 2023-09-11 LAB — BASIC METABOLIC PANEL WITH GFR
BUN/Creatinine Ratio: 20 (ref 12–28)
BUN: 12 mg/dL (ref 8–27)
CO2: 20 mmol/L (ref 20–29)
Calcium: 10.8 mg/dL — ABNORMAL HIGH (ref 8.7–10.3)
Chloride: 99 mmol/L (ref 96–106)
Creatinine, Ser: 0.59 mg/dL (ref 0.57–1.00)
Glucose: 152 mg/dL — ABNORMAL HIGH (ref 70–99)
Potassium: 4.8 mmol/L (ref 3.5–5.2)
Sodium: 136 mmol/L (ref 134–144)
eGFR: 96 mL/min/{1.73_m2} (ref >59)

## 2023-09-11 LAB — CBC
Hematocrit: 44.6 % (ref 34.0–46.6)
Hemoglobin: 14.3 g/dL (ref 11.1–15.9)
MCH: 29.4 pg (ref 26.6–33.0)
MCHC: 32.1 g/dL (ref 31.5–35.7)
MCV: 92 fL (ref 79–97)
Platelets: 242 10*3/uL (ref 150–450)
RBC: 4.87 x10E6/uL (ref 3.77–5.28)
RDW: 13.4 % (ref 11.7–15.4)
WBC: 9 10*3/uL (ref 3.4–10.8)

## 2023-09-11 LAB — TSH: TSH: 0.758 u[IU]/mL (ref 0.450–4.500)

## 2023-09-11 LAB — MAGNESIUM: Magnesium: 2.1 mg/dL (ref 1.6–2.3)

## 2023-09-11 NOTE — Progress Notes (Signed)
 Kidney function remains stable, potassium is stable, blood glucose remains elevated (recommend discussing with PCP), blood counts stable on current anticoagulation, magnesium  and thyroid  levels are stable. No changes to current medication regimen at this time.

## 2023-09-18 DIAGNOSIS — L249 Irritant contact dermatitis, unspecified cause: Secondary | ICD-10-CM | POA: Diagnosis not present

## 2023-09-18 DIAGNOSIS — L0101 Non-bullous impetigo: Secondary | ICD-10-CM | POA: Diagnosis not present

## 2023-09-24 NOTE — Progress Notes (Unsigned)
  Electrophysiology Office Follow up Visit Note:    Date:  09/25/2023   ID:  Lisa Gutierrez, DOB [birth year], MRN [id number]  PCP:  Noreene Bearded, PA  [facility] Cardiologist:  Antionette Kirks, MD  [facility] Electrophysiologist:  Boyce Byes, MD    Interval History:     Lisa Gutierrez is a 73 y.o. female who presents for a follow up visit.   She had a catheter ablation 05/02/2023 during which the veins and posterior wall were ablated.   Unfortunately, she had experienced recurrence of symptomatic AF.  With her heart rhythm, she is doing okay.  She is having some shortness of breath still while in atrial fibrillation.  Her biggest complaint today is about her anxiety.  She is interested in establishing with a family care provider and would like one closer to her home in [place].  She previously was established with 1 in [place].       Past medical, surgical, social and family history were reviewed.  ROS:   Please see the history of present illness.    All other systems reviewed and are negative.  EKGs/Labs/Other Studies Reviewed:    The following studies were reviewed today:  09/10/2023 ECG shows AF w/ RVR 08/21/2023 ECG shows sinus 07/24/2023 ECG shows sinus 06/19/2023 ECG shows sinus 06/03/2023 ECG shows sinus 06/03/2023 ECG shows AF  ECGs show QTc .   EKG Interpretation Date/Time:  Wednesday September 25 2023 12:11:20 EDT Ventricular Rate:  80 PR Interval:    QRS Duration:  84 QT Interval:  358 QTC Calculation: 412 R Axis:   -43  Text Interpretation: Atrial fibrillation Left axis deviation Confirmed by Harvie Liner [phone]) on 09/25/2023 12:12:40 PM    Physical Exam:    VS:  BP 110/64 (BP Location: Left Arm)   Pulse 80   Ht 5' 5 (1.651 m)   Wt 205 lb (93 kg)   SpO2 95%   BMI 34.11 kg/m     Wt Readings from Last 3 Encounters:  09/25/23 205 lb (93 kg)  09/10/23 208 lb (94.3 kg)  08/21/23 207 lb (93.9 kg)     GEN:  no distress, anxious CARD: Irregularly irregular, No MRG RESP: No IWOB. CTAB.      ASSESSMENT:    1. Paroxysmal atrial fibrillation (HCC)   2. Essential hypertension    PLAN:    In order of problems listed above:  #Persistent AF Recurrent after prior catheter ablation. I discussed treatment options including redo catheter ablation and AAD.  She is interested in avoiding another catheter ablation which I think is reasonable. I discussed antiarrhythmic options today including amiodarone and Tikosyn. We will start with Tikosyn.  I discussed the medication and its need for inpatient hospitalization.  I discussed the risks associate with Tikosyn.  I discussed drug interactions with Tikosyn.  She wishes to proceed.  We will get this scheduled for her.  I will also put in a referral for her to see Dr. Madelon Scheuermann in primary care.  Continue Eliquis .  #Hypertension Controlled goal today.  Recommend checking blood pressures 1-2 times per week at home and recording the values.  Recommend bringing these recordings to the primary care physician.    Signed, Harvie Liner, MD, [facility], [facility] 09/25/2023 12:27 PM    Electrophysiology [facility]

## 2023-09-25 ENCOUNTER — Ambulatory Visit: Attending: Cardiology | Admitting: Cardiology

## 2023-09-25 ENCOUNTER — Other Ambulatory Visit: Payer: Self-pay | Admitting: Psychiatry

## 2023-09-25 VITALS — BP 110/64 | HR 80 | Ht 65.0 in | Wt 205.0 lb

## 2023-09-25 DIAGNOSIS — I10 Essential (primary) hypertension: Secondary | ICD-10-CM | POA: Diagnosis not present

## 2023-09-25 DIAGNOSIS — I480 Paroxysmal atrial fibrillation: Secondary | ICD-10-CM

## 2023-09-25 NOTE — Telephone Encounter (Signed)
 Dose was reduced to 5 mg twice a day. Please ask her if she needs a refill at this time

## 2023-09-25 NOTE — Patient Instructions (Addendum)
 Medication Instructions:  Your physician recommends that you continue on your current medications as directed. Please refer to the Current Medication list given to you today.  *If you need a refill on your cardiac medications before your next appointment, please call your pharmacy*  Follow-Up: At [facility], you and your health needs are our priority.  As part of our continuing mission to provide you with exceptional heart care, our providers are all part of one team.  This team includes your primary Cardiologist (physician) and Advanced Practice Providers or APPs (Physician Assistants and Nurse Practitioners) who all work together to provide you with the care you need, when you need it.  Tikosyn (Dofetilide) Hospital Admission   Prior to day of admission:  Check with drug insurance company for cost of drug to ensure affordability --- Dofetilide 500 mcg twice a day.  GoodRx is an option if insurance copay is unaffordable.    No Benadryl  is allowed 3 days prior to admission.   Please ensure no missed doses of your anticoagulation (blood thinner) for 3 weeks prior to admission. If a dose is missed please notify our office immediately.   A pharmacist will review all your medications for potential interactions with Tikosyn. If any medication changes are needed prior to admission we will be in touch with you.   If any new medications are started AFTER your admission date is set with Radio [employer]. Please notify our office immediately so your medication list can be updated and reviewed by our pharmacist again.  On day of admission:  Tikosyn initiation requires a 3 night/4 day hospital stay with constant telemetry monitoring. You will have an EKG after each dose of Tikosyn as well as daily lab draws.   If the drug does not convert you to normal rhythm a cardioversion after the 4th dose of Tikosyn.   Afib Clinic office visit on the morning of admission is needed for preliminary labs/ekg.    Time of admission is dependent on bed availability in the hospital. In some instances, you will be sent home until bed is available. Rarely admission can be delayed to the following day if hospital census prevents available beds.   You may bring personal belongings/clothing with you to the hospital. Please leave your suitcase in the car until you arrive in admissions.   Questions please call our office at [phone]

## 2023-09-26 ENCOUNTER — Telehealth: Payer: Self-pay | Admitting: Pharmacist

## 2023-09-26 ENCOUNTER — Telehealth: Payer: Self-pay

## 2023-09-26 NOTE — Telephone Encounter (Signed)
 I responded to her MyChart message regarding the possible cardiac procedure as below. For her symptoms of shortness of breath and fatigue, please advise her to reach out to her cardiologist or primary care provider, as her underlying cardiac condition may be contributing to these symptoms. Let me know if there are any additional concerns.  Copied MyChart message: Venlafaxine  can potentially interact with Tikosyn, as both can affect heart rhythm (QTc prolongation). Since your cardiologist is aware of your medications and monitoring your heart, I'm comfortable continuing for now. Just to be safe, please double-check that your cardiologist knows you're taking venlafaxine , as it may require some monitoring.

## 2023-09-26 NOTE — Telephone Encounter (Signed)
 Spoke to patient she is wanting to talk to you about a procedure she is going to have with her cardiologist who is asking about some of the medications she is currently taking she stated that her heart is out of rhythm she is having problems breathing she has to gasp for breath and she gets tired just from walking please advise

## 2023-09-26 NOTE — Telephone Encounter (Signed)
 Medication list reviewed in anticipation of upcoming Tikosyn initiation. Patient is no taking any contraindicated medications. She is taking a few potentially interacting or QTc prolonging medications.   Furosemide : please monitor electrolytes closely. Please ensure that K is at least 4 and Mag at least 2.  Metformin : MetFORMIN  may increase serum concentration of Dofetilide. Monitor for s/sx of toxicity. Monitor QTc  Alprazolam : may increase serum concentration of Dofetilide. . Monitor for s/sx of toxicity. Monitor QTc  Patient is anticoagulated on Eliquis  on the appropriate dose. Please ensure that patient has not missed any anticoagulation doses in the 3 weeks prior to Tikosyn initiation.   Patient will need to be counseled to avoid use of Benadryl while on Tikosyn and in the 2-3 days prior to Tikosyn initiation.

## 2023-09-27 ENCOUNTER — Telehealth: Payer: Self-pay | Admitting: Cardiology

## 2023-09-27 NOTE — Telephone Encounter (Signed)
 Dr Marven Slimmer recommended someone as a PCP- but she cannot remember the name. Needs the name of the PCP.   Also, she has not heard from [facility] (someone at the hospital) to set up Tikosyn at the hospital.    Wondering if Ottie Blonder and [place] like their jam?

## 2023-09-27 NOTE — Telephone Encounter (Signed)
 Patient requests to speak directly with Lisa Gutierrez if possible. She says she has a few questions, but declines discussing with me. Please advise.

## 2023-09-30 ENCOUNTER — Other Ambulatory Visit: Payer: Self-pay | Admitting: Psychiatry

## 2023-09-30 MED ORDER — BUSPIRONE HCL 5 MG PO TABS: 5.0000 mg | ORAL_TABLET | Freq: Two times a day (BID) | ORAL | 0 refills | Status: DC

## 2023-10-01 ENCOUNTER — Encounter (HOSPITAL_COMMUNITY): Payer: Self-pay | Admitting: *Deleted

## 2023-10-02 ENCOUNTER — Telehealth (HOSPITAL_COMMUNITY): Payer: Self-pay

## 2023-10-02 NOTE — Telephone Encounter (Signed)
 Initiated prior authorization for Tikosyn admission. Date of service: 10/21/2023 [employer] advised me to fax notes for clinical review before proceeding with pending authorization # Faxed clinical notes for review. Fax # [phone]

## 2023-10-03 NOTE — Progress Notes (Unsigned)
 [facility] MD/PA/NP OP Progress Note  10/03/2023 9:06 AM Lisa Gutierrez  MRN:  [id number]  Chief Complaint: No chief complaint on file.  HPI:  - since the last visit, she was seen by Dr. Marven Slimmer, cardiologist.  #Persistent AF Recurrent after prior catheter ablation. I discussed treatment options including redo catheter ablation and AAD.  She is interested in avoiding another catheter ablation which I think is reasonable. I discussed antiarrhythmic options today including amiodarone and Tikosyn. We will start with Tikosyn.  I discussed the medication and its need for inpatient hospitalization.  I discussed the risks associate with Tikosyn.  I discussed drug interactions with Tikosyn.  She wishes to proceed.  We will get this scheduled for her.  Wt Readings from Last 3 Encounters:  09/25/23 205 lb (93 kg)  09/10/23 208 lb (94.3 kg)  08/21/23 207 lb (93.9 kg)     Daily routine: takes care of her 3 dogs, 5 cats, house chores Exercise: unable to do due to knee pain Employment:  Support: none (talks with her oldest granddaughter, who has left to college) Household: husband Marital status: married for 52 years  Number of children: 2 sons.    Visit Diagnosis: No diagnosis found.  Past Psychiatric History: Please see initial evaluation for full details. I have reviewed the history. No updates at this time.     Past Medical History:  Past Medical History:  Diagnosis Date   Anxiety    Atrial fibrillation (HCC)    Depression    Depression    Phreesia 07/02/2020   Diabetes mellitus without complication (HCC)    GERD (gastroesophageal reflux disease)    Hypertension    IBS (irritable bowel syndrome)    Neuromuscular disorder (HCC)     Past Surgical History:  Procedure Laterality Date   ATRIAL FIBRILLATION ABLATION N/A 05/02/2023   Procedure: ATRIAL FIBRILLATION ABLATION;  Surgeon: Boyce Byes, MD;  Location: [facility];  Service: Cardiovascular;  Laterality: N/A;    BREAST EXCISIONAL BIOPSY     CARDIOVERSION N/A 09/03/2022   Procedure: CARDIOVERSION;  Surgeon: Wenona Hamilton, MD;  Location: [facility] ORS;  Service: Cardiovascular;  Laterality: N/A;   CARDIOVERSION N/A 06/03/2023   Procedure: CARDIOVERSION;  Surgeon: Constancia Delton, MD;  Location: [facility] ORS;  Service: Cardiovascular;  Laterality: N/A;   CATARACT EXTRACTION W/PHACO Right 07/24/2022   Procedure: CATARACT EXTRACTION PHACO AND INTRAOCULAR LENS PLACEMENT (IOC) RIGHT DIABETIC  5.93  00:42.5;  Surgeon: Clair Crews, MD;  Location[facility];  Service: Ophthalmology;  Laterality: Right;   CATARACT EXTRACTION W/PHACO Left 08/07/2022   Procedure: CATARACT EXTRACTION PHACO AND INTRAOCULAR LENS PLACEMENT (IOC) LEFT DIABETIC  12.33  00:58.3;  Surgeon: Clair Crews, MD;  Location: [facility];  Service: Ophthalmology;  Laterality: Left;   CESAREAN SECTION N/A    Phreesia 07/02/2020   CHOLECYSTECTOMY     EXCISION / BIOPSY BREAST / NIPPLE / DUCT Right 1973   duct removed   SPINE SURGERY N/A    Phreesia 07/02/2020   TUBAL LIGATION N/A    Phreesia 07/02/2020    Family Psychiatric History: Please see initial evaluation for full details. I have reviewed the history. No updates at this time.     Family History:  Family History  Problem Relation Age of Onset   Diabetes Mother    Hypertension Mother    Coronary artery disease Father    Glaucoma Father    Breast cancer Neg Hx     Social History:  Social History   Socioeconomic History   Marital status: Married    Spouse name: Not on file   Number of children: Not on file   Years of education: Not on file   Highest education level: Not on file  Occupational History   Not on file  Tobacco Use   Smoking status: Former    Passive exposure: Past   Smokeless tobacco: Never  Vaping Use   Vaping status: Never Used  Substance and Sexual Activity   Alcohol use: No    Alcohol/week: 0.0 standard drinks of alcohol    Drug use: Never   Sexual activity: Not Currently    Partners: Male  Other Topics Concern   Not on file  Social History Narrative   Not on file   Social Drivers of Health   Financial Resource Strain: Low Risk  (10/15/2022)   Overall Financial Resource Strain (CARDIA)    Difficulty of Paying Living Expenses: Not very hard  Food Insecurity: No Food Insecurity (10/15/2022)   Hunger Vital Sign    Worried About Running Out of Food in the Last Year: Never true    Ran Out of Food in the Last Year: Never true  Transportation Needs: No Transportation Needs (08/09/2022)   PRAPARE - Administrator, Civil Service (Medical): No    Lack of Transportation (Non-Medical): No  Physical Activity: Inactive (08/09/2022)   Exercise Vital Sign    Days of Exercise per Week: 0 days    Minutes of Exercise per Session: 0 min  Stress: Stress Concern Present (10/15/2022)   [employer] of Occupational Health - Occupational Stress Questionnaire    Feeling of Stress : Rather much  Social Connections: Socially Integrated (08/09/2022)   Social Connection and Isolation Panel    Frequency of Communication with Friends and Family: More than three times a week    Frequency of Social Gatherings with Friends and Family: More than three times a week    Attends Religious Services: More than 4 times per year    Active Member of [employer] or Organizations: Yes    Attends Engineer, structural: More than 4 times per year    Marital Status: Married    Allergies:  Allergies  Allergen Reactions   Aspirin Other (See Comments)    GI upset   Ibuprofen Other (See Comments)    GERD   Levofloxacin Other (See Comments)    Weight gain   Meloxicam Other (See Comments)    GI upset   Morphine Other (See Comments)   Naprosyn  [Naproxen] Other (See Comments)    GI upset   Nsaids Other (See Comments)    GI upset   Ozempic  (0.25 Or 0.5 Mg-Dose) [Semaglutide (0.25 Or 0.5mg -Dos)] Nausea And Vomiting   Propofol  Other  (See Comments)    Had horrible nightmares   Quinolones    Robinul  [Glycopyrrolate] Nausea And Vomiting   Penicillin V Potassium Rash   Sulfa Antibiotics Rash    Metabolic Disorder Labs: Lab Results  Component Value Date   HGBA1C 9.2 (H) 03/26/2023   MPG 169 07/10/2022   No results found for: PROLACTIN Lab Results  Component Value Date   CHOL 163 03/26/2023   TRIG 340 (H) 03/26/2023   HDL 24 (L) 03/26/2023   CHOLHDL 6.8 (H) 03/26/2023   LDLCALC 83 03/26/2023   LDLCALC 123 (H) 08/24/2021   Lab Results  Component Value Date   TSH 0.758 09/10/2023   TSH 0.814 03/26/2023  Therapeutic Level Labs: No results found for: LITHIUM No results found for: VALPROATE No results found for: CBMZ  Current Medications: Current Outpatient Medications  Medication Sig Dispense Refill   acetaminophen  (TYLENOL ) 500 MG tablet Take 1,000 mg by mouth every 8 (eight) hours as needed for mild pain (pain score 1-3), moderate pain (pain score 4-6) or headache.     ALPRAZolam  (XANAX ) 0.5 MG tablet Take 1 tablet (0.5 mg total) by mouth at bedtime as needed. 30 tablet 1   apixaban  (ELIQUIS ) 5 MG TABS tablet Take 1 tablet (5 mg total) by mouth 2 (two) times daily. 60 tablet 11   BLACK ELDERBERRY PO Take 1 tablet by mouth daily.     Blood Glucose Monitoring Suppl (ONE TOUCH ULTRA 2) w/Device KIT CHECK IN THE MORNING, AT NOON, AND AT BEDTIME. MAY SUBSTITUTE TO ANY MANUFACTURER COVERED BY PATIENT'S INSURANCE. 300 kit 3   busPIRone  (BUSPAR ) 5 MG tablet Take 5 mg by mouth 2 (two) times daily.     busPIRone  (BUSPAR ) 5 MG tablet Take 1 tablet (5 mg total) by mouth 2 (two) times daily. 180 tablet 0   Calcium  Carb-Cholecalciferol  (CALCIUM  600/VITAMIN D3) 600-20 MG-MCG TABS Take 1 tablet by mouth 2 (two) times daily.     Cholecalciferol  (VITAMIN D3) 50 MCG (2000 UT) capsule Take 2,000 Units by mouth daily.     clobetasol  ointment (TEMOVATE ) 0.05 % Apply topically 2 (two) times daily. Apply topically to  affected twice daily until improved. (Patient taking differently: Apply 1 Application topically 2 (two) times daily as needed (progeria nodularis).) 30 g 2   digoxin  (LANOXIN ) 0.25 MG tablet TAKE 1 TABLET BY MOUTH EVERY DAY 90 tablet 3   Docusate Calcium  (STOOL SOFTENER PO) Take 2 tablets by mouth at bedtime.     empagliflozin  (JARDIANCE ) 25 MG TABS tablet Take 1 tablet (25 mg total) by mouth daily before breakfast. 90 tablet 1   Flaxseed, Linseed, (FLAXSEED OIL) 1400 MG CAPS Take 1,400 mg by mouth 2 (two) times daily.     furosemide  (LASIX ) 20 MG tablet TAKE 1 TABLET BY MOUTH EVERY DAY 30 tablet 6   Glucose Blood (BLOOD GLUCOSE TEST STRIPS) STRP 1 each by In Vitro route in the morning, at noon, and at bedtime. May substitute to any manufacturer covered by patient's insurance. 100 strip 9   glucose blood test strip Use as instructed 100 each 12   hydrocortisone  (ANUSOL -HC) 25 MG suppository Place 1 suppository (25 mg total) rectally 2 (two) times daily. 12 suppository 1   linaclotide  (LINZESS ) 290 MCG CAPS capsule Take 1 capsule (290 mcg total) by mouth daily before breakfast. 30 capsule 2   losartan  (COZAAR ) 25 MG tablet Take 1 tablet (25 mg total) by mouth daily. 90 tablet 0   metFORMIN  (GLUCOPHAGE -XR) 500 MG 24 hr tablet TAKE 1 TABLET BY MOUTH TWICE A DAY WITH FOOD 60 tablet 1   metoprolol  tartrate (LOPRESSOR ) 100 MG tablet Take 1 tablet (100 mg total) by mouth 2 (two) times daily. 180 tablet 3   Multiple Vitamins-Minerals (MULTIVITAMIN ADULT PO) Take 1 tablet by mouth daily. One a day     mupirocin ointment (BACTROBAN) 2 % Apply topically 2 (two) times daily.     pantoprazole  (PROTONIX ) 40 MG tablet TAKE 1 TABLET BY MOUTH TWICE A DAY 180 tablet 1   RYBELSUS 14 MG TABS Take 1 tablet by mouth every morning.     venlafaxine  XR (EFFEXOR -XR) 150 MG 24 hr capsule Take 1 capsule (150 mg total)  by mouth daily with breakfast. Take total of 187.5 mg daily. Take along with 37.5 mg cap 90 capsule 0    venlafaxine  XR (EFFEXOR -XR) 37.5 MG 24 hr capsule Take 1 capsule (37.5 mg total) by mouth daily with breakfast. Take total of 187.5 mg daily. Take along with 150 mg cap 90 capsule 0   No current facility-administered medications for this visit.     Musculoskeletal: Strength & Muscle Tone: N/A Gait & Station: N/A Patient leans: N/A  Psychiatric Specialty Exam: Review of Systems  There were no vitals taken for this visit.There is no height or weight on file to calculate BMI.  General Appearance: {Appearance:22683}  Eye Contact:  {BHH EYE CONTACT:22684}  Speech:  Clear and Coherent  Volume:  Normal  Mood:  {BHH MOOD:22306}  Affect:  {Affect (PAA):22687}  Thought Process:  Coherent  Orientation:  Full (Time, Place, and Person)  Thought Content: Logical   Suicidal Thoughts:  {ST/HT (PAA):22692}  Homicidal Thoughts:  {ST/HT (PAA):22692}  Memory:  Immediate;   Good  Judgement:  {Judgement (PAA):22694}  Insight:  {Insight (PAA):22695}  Psychomotor Activity:  Normal  Concentration:  Concentration: Good and Attention Span: Good  Recall:  Good  Fund of Knowledge: Good  Language: Good  Akathisia:  No  Handed:  Right  AIMS (if indicated): not done  Assets:  Communication Skills Desire for Improvement  ADL's:  Intact  Cognition: WNL  Sleep:  {BHH GOOD/FAIR/POOR:22877}   Screenings: GAD-7    Flowsheet Row Office Visit from 05/09/2023 in [facility] at [facility] Visit from 12/27/2022 in [facility] at [facility] Video Visit from 11/08/2022 in [facility] Visit from 09/25/2022 in [facility] at [facility] Erroneous Encounter from 07/18/2022 in [facility] Care at [facility]  Total GAD-7 Score 15 17 15 12 13    Mini-Mental    Flowsheet Row Clinical Support from 02/12/2020 in [facility] Clinical Support from 02/06/2018 in [facility], [facility]  Total Score (max 30 points )  29 30   PHQ2-9    Flowsheet Row Office Visit from 05/09/2023 in [facility] Primary Care at [facility] Office Visit from 12/27/2022 in [facility] Primary Care at [facility] Video Visit from 11/08/2022 in [facility] Primary Care at [facility] Coordination from 10/15/2022 in [facility] Care Coordination Office Visit from 09/25/2022 in [facility] at [facility]  PHQ-2 Total Score 4 6 6 5 5   PHQ-9 Total Score 14 24 22 19 19    Flowsheet Row ED from 05/09/2023 in [facility] Department at [facility] Admission (Discharged) from 05/02/2023 in [facility] Admission (Discharged) from 08/07/2022 in [facility] PERIOP  C-SSRS RISK CATEGORY No Risk No Risk No Risk     Assessment and Plan:  Taliah Porche is a 73 y.o. year old female with a history of depression, hypertension, diabetes, GERD, newly diagnosed Afib with RVR, who presents for follow up appointment for below.    1. PTSD (post-traumatic stress disorder) 2. MDD (major depressive disorder), recurrent episode, moderate (HCC) 3. GAD (generalized anxiety disorder) The patient reports chronic joint pain. Psychologically, she describes significant emotional strain, feeling unsupported and isolated. Socially, she states that her husband, son, and grandchildren are mean and hateful toward her and lack empathy or sympathy, though she denies any history of abuse. She reports  limited emotional support and struggles with coping, often feeling overwhelmed and alone in managing her physical and emotional needs. History:  Tx from Dr. Deborra Falter     The exam is notable for less irritability, lability since tapering down BuSpar .  She now experiences PTSD symptoms from recent admission, including Afib, and intrusive memories of her mother's passing, which occurred in her lap.  Although she may benefit from prazosin in the future, she experiences  dizziness, which may be secondary to A-fib.  She agrees to stay on the current medication regimen until she completes cardiac evaluation.  Will continue venlafaxine  to target PTSD, depression and anxiety.  Will continue BuSpar  for anxiety. She will greatly benefit from CBT.  She is on the waiting list.  Although she was also recommended for IOP, she would like to hold this for now.    # insomnia - sleep eval long time ago, reportedly normal.  Unstable.  She complains of middle insomnia and daytime fatigue. Although she was strongly encouraged to consider a sleep evaluation, she declined at this.  Will make the above intervention as outlined above.      Plan Continue venlafaxine  187.5 mg daily- monitor weight gain Continue Buspar  5 mg twice a day-  reduced by 5 mg in May 2025 due to concern of possible adverse reaction.  Next appointment : 6/24 at 1 pm, video - on xanax  0.5 mg at night as needed for anxiety, prescribed by primary care    Past trials of medication: sertraline, citalopram, lexapro , fluoxetine , bupropion  (headache)   The patient demonstrates the following risk factors for suicide: Chronic risk factors for suicide include: psychiatric disorder of depression and chronic pain. Acute risk factors for suicide include: family or marital conflict. Protective factors for this patient include: hope for the future. Considering these factors, the overall suicide risk at this point appe  Collaboration of Care: Collaboration of Care: [facility] OP Collaboration of UYQI:[id number]}  Patient/Guardian was advised Release of Information must be obtained prior to any record release in order to collaborate their care with an outside provider. Patient/Guardian was advised if they have not already done so to contact the registration department to sign all necessary forms in order for us  to release information regarding their care.   Consent: Patient/Guardian gives verbal consent for treatment and assignment of  benefits for services provided during this visit. Patient/Guardian expressed understanding and agreed to proceed.    Todd Fossa, MD 10/03/2023, 9:06 AM

## 2023-10-04 ENCOUNTER — Other Ambulatory Visit: Payer: Self-pay | Admitting: Family Medicine

## 2023-10-04 DIAGNOSIS — E1165 Type 2 diabetes mellitus with hyperglycemia: Secondary | ICD-10-CM

## 2023-10-07 ENCOUNTER — Encounter: Payer: Self-pay | Admitting: Cardiology

## 2023-10-08 ENCOUNTER — Encounter: Payer: Self-pay | Admitting: Psychiatry

## 2023-10-08 ENCOUNTER — Telehealth: Admitting: Psychiatry

## 2023-10-08 DIAGNOSIS — F331 Major depressive disorder, recurrent, moderate: Secondary | ICD-10-CM | POA: Diagnosis not present

## 2023-10-08 DIAGNOSIS — F411 Generalized anxiety disorder: Secondary | ICD-10-CM | POA: Diagnosis not present

## 2023-10-08 DIAGNOSIS — F4310 Post-traumatic stress disorder, unspecified: Secondary | ICD-10-CM

## 2023-10-08 MED ORDER — VENLAFAXINE HCL ER 37.5 MG PO CP24
37.5000 mg | ORAL_CAPSULE | Freq: Every day | ORAL | 0 refills | Status: DC
  Filled 2023-12-09: qty 30, 30d supply, fill #0

## 2023-10-08 MED ORDER — VENLAFAXINE HCL ER 150 MG PO CP24
150.0000 mg | ORAL_CAPSULE | Freq: Every day | ORAL | 0 refills | Status: DC
  Filled 2023-12-09 – 2023-12-23 (×2): qty 30, 30d supply, fill #0
  Filled 2023-12-23: qty 90, 90d supply, fill #0

## 2023-10-10 ENCOUNTER — Other Ambulatory Visit (HOSPITAL_COMMUNITY): Payer: Self-pay

## 2023-10-11 NOTE — Telephone Encounter (Signed)
 Notified [employer] regarding Tikosyn admission. Date of service has changed from 7/7 to 10/29/2023. Admission has been approved.  Authorization # [id number]

## 2023-10-16 ENCOUNTER — Other Ambulatory Visit: Payer: Self-pay | Admitting: Pharmacist

## 2023-10-16 NOTE — Progress Notes (Signed)
   10/16/2023  Patient ID: Lisa Gutierrez, female   DOB: 01-14-1951, 73 y.o.   MRN: [id number]  Called and spoke with the patient on the phone regarding medication updates.   Regarding Tikosyn, she reports having her hospital stay start on 10/29/23 due to accidentally missing an anticoagulation pill.   Advised about 11% of patients get headaches on Tikosyn and that most reviews state it occurs with the first few doses. Should go away over time. If still present after a few weeks, need to switch medications. Reports she does not frequently get headaches. No other common side effects to note.  Advised Cardiology pharmacist already left medication recommendations. They are not changing any meds at this time and will monitor for reactions while in the hospital. Tikosyn is renally adjusted for the dose.   For cost, will be $73 monthly with insurance for generic Dofetilide. Should qualify for Pfizer PAP based on income.   Called office for Dr. Tullo as recommended, but is not currently taking patients. Spoke with pharmacist at [facility] and Dr. Abbey is her recommendation at this time. Patient would like something closer to home than current location at [facility]. Will send info through MyChart.  Follow-up on 11/04/23 for next steps review + referral to [facility] for Pfizer PAP for Tikosyn if needed.   Aloysius Lewis, PharmD [facility]  Phone Number: [phone]

## 2023-10-18 ENCOUNTER — Other Ambulatory Visit: Payer: Self-pay | Admitting: Cardiology

## 2023-10-22 ENCOUNTER — Ambulatory Visit (HOSPITAL_COMMUNITY): Admitting: Physician Assistant

## 2023-10-22 ENCOUNTER — Encounter: Payer: Self-pay | Admitting: Cardiology

## 2023-10-23 ENCOUNTER — Other Ambulatory Visit (HOSPITAL_COMMUNITY): Payer: Self-pay

## 2023-10-23 ENCOUNTER — Telehealth: Payer: Self-pay

## 2023-10-23 ENCOUNTER — Telehealth: Payer: Self-pay | Admitting: Pharmacy Technician

## 2023-10-23 DIAGNOSIS — Z5986 Financial insecurity: Secondary | ICD-10-CM

## 2023-10-23 NOTE — Telephone Encounter (Signed)
 Pharmacy Patient Advocate Encounter  Insurance verification completed.   The patient is insured through [facility] ADVANTAGE/RX ADVANCE   Ran test claim for dofetilide. Currently a quantity of 60 is a 30 day supply and the co-pay is $0 .   This test claim was processed through [facility]- copay amounts may vary at other pharmacies due to pharmacy/plan contracts, or as the patient moves through the different stages of their insurance plan.

## 2023-10-23 NOTE — Progress Notes (Addendum)
   10/23/2023  Patient ID: Lisa Gutierrez, female   DOB: 1950/12/26, 73 y.o.   MRN: [id number]  Incoming call received from patient in regard to Tikosyn . HIPAA verified.  Patient informs she is going to hospital on 10/29/23 and will be placed on Tikosyn . She informs she called the insurance and the medication is not covered. Patient informs she will need assistance with cost of medication.  In basket message sent to Christopher Pavero, PharmD and Chick Centers, PharmD for assistance.  ADDENDUM Received the following in basket message back from PharmD: Pavero, Christopher, RPH  Lisa Gutierrez, Lisa Gutierrez; Centers Lisa Gutierrez, Lisa Gutierrez No problem Kate, we will check into this for you  Patient was informed.  Patient also requested assistance with Xanax  refill. Advised patient to call prescriber's office to request refill and inform she will be out of medication and informed patient to make appointment at clinic if necessary.  Patient verbalized understanding.  Lisa Gutierrez, Lisa Gutierrez [facility] Office: [phone] Email: [email or web address]

## 2023-10-24 ENCOUNTER — Encounter: Payer: Self-pay | Admitting: Cardiology

## 2023-10-24 ENCOUNTER — Ambulatory Visit: Attending: Cardiology | Admitting: Cardiology

## 2023-10-24 VITALS — BP 120/84 | HR 90 | Ht 65.0 in | Wt 207.6 lb

## 2023-10-24 DIAGNOSIS — E669 Obesity, unspecified: Secondary | ICD-10-CM | POA: Diagnosis not present

## 2023-10-24 DIAGNOSIS — E1165 Type 2 diabetes mellitus with hyperglycemia: Secondary | ICD-10-CM

## 2023-10-24 DIAGNOSIS — I10 Essential (primary) hypertension: Secondary | ICD-10-CM | POA: Diagnosis not present

## 2023-10-24 DIAGNOSIS — I4819 Other persistent atrial fibrillation: Secondary | ICD-10-CM | POA: Diagnosis not present

## 2023-10-24 DIAGNOSIS — F411 Generalized anxiety disorder: Secondary | ICD-10-CM | POA: Diagnosis not present

## 2023-10-24 NOTE — Progress Notes (Signed)
 Cardiology Office Note   Date:  10/24/2023  ID:  Derinda Bartus, DOB 16-May-1950, MRN [id number] PCP: Wallace Joesph LABOR, PA  [facility] Providers Cardiologist:  Deatrice Cage, MD Cardiology APP:  Gerard Frederick, NP  Electrophysiologist:  [name], MD     History of Present Illness Lisa Gutierrez is a 73 y.o. female with a past medical history of essential hypertension, chronic electrolyte abnormalities, GERD, IBS with constipation, chronic superficial gastritis without bleeding, fatty liver disease, type 2 diabetes, GAD, insomnia, chronic history of statin intolerance, morbid obesity, persistent atrial fibrillation status post DCCV x 2 with ablation, who is here today for follow-up.   She had previously presented for cataract surgery on 07/10/2022 was found to be in atrial fibrillation with RVR by anesthesia.  Heart rate was 1 70-1 9 past time and procedure was subsequently canceled.  Echocardiogram revealed an LVEF of 50-55%, no RWMA, no valvular abnormalities were noted.  She was started on metoprolol , dig, and apixaban  and subsequently discharged on 07/04/2022.  She followed up in clinic on July 16, 2022 where she been tolerating her apixaban  and beta-blockers without incident she was scheduled for cardioversion procedure after being on anticoagulant with 4 weeks uninterrupted.  She did previously undergone cardioversion that was successful for generalized vision she was noted to be back in atrial fibrillation.   She was last seen in clinic 11/30/2022 she continued to be tearful with complaints of being depressed, weak, fatigued, anxious, with multiple questions about her medications.  She was continued on apixaban  for CHA2DS2-VASc of at least 5 for stroke prophylaxis.  EKG revealed she was still in atrial fibrillation.  She was apprehensive about repeat cardioversion she was referred to EP.  She was seen in clinic by EP on 02/27/2023 discussed treatment options  for atrial fibrillation including antiarrhythmic drug therapy and ablation.  After discussing risk, recovery, and likelihood of success with the treatment strategy she had opted for the ablation procedure.  This was to be scheduled for 05/02/2023.Lisa Gutierrez  She presented to the [facility] emergency department 1/23 with shortness of breath, hypoxic.  PCR swab was positive for RSV.  Chest x-ray concerning for pneumonia.  She continued to remain in sinus rhythm on presentation.  She was admitted overnight.  Since discharge she had an ongoing cough that improved with Mucinex .  She noticed a catching sensation in her chest and that she was back in A-fib.  She states she was diligent about taking her apixaban  twice daily and was scheduled for repeat cardioversion.  She was seen in clinic by EP on 05/30/2023 for routine 4-week post ablation appointment where she was in A-fib.  She was status post DCCV with return to sinus rhythm.  She was then followed up and was not aware of any A-fib episodes.  She does not radiate, or catching sensation in her checks.  Blood pressure has been well-controlled.  Upper respiratory infection with increased nasal drainage and some facial tenderness and cough.  She questions what medications that she can safely take at the time.  Continue to diligently take her apixaban  with no missed doses.  She was to continue to maintain sinus rhythm continue on metoprolol  75 mg twice daily and apixaban  5 mg twice daily.  For blood pressure she was continued on losartan  25 mg daily.  She was seen in clinic/9/25 states she been doing well from a car perspective.  She continues to have occasional sensation which only she was unable to catch her  breath.  States she been compliant with her current medication regimen.  She was sent for an updated digoxin  level and was continued on her current medication regimen with no further testing that was ordered at that time.    She was last seen in clinic 09/25/2023 by Dr.  Cindie.  Unfortunately she had experienced recurrence of symptomatic A-fib.  With her heart rhythm she was doing okay.  She was continued to have shortness of breath while in atrial fibrillation.  Biggest complaint was anxiety.  They discussed redo ablation and the patient was interested in avoiding another catheter ablation which was reasonable.  I discussed antiarrhythmic options including amiodarone and Tikosyn.  She was to start with Tikosyn therapy and was advised that it would require hospitalization.  She was to be admitted to the hospital on 10/29/2023 for initiation of Tikosyn.  She returns to clinic today stating that overall she has been doing well.  She continues to be easily fatigued and tired all the time with increasing symptoms of depression and she feels like she is starting up from the very beginning after going through the various procedures that she has gone through.  She states she has been compliant with her current medication regimen.  She states that there was an issue with a blood thinner when she dropped on the floor which she has not missed any since that time.  She has had no adverse side effects from her medication regimen.  She denies any bleeding with no blood noted in her urine or stool.  She is very appreciative of Glade she talked on the phone about her hospital admission and Tikosyn.  She denies any prior hospitalizations or visits to the emergency department and is prepared for upcoming admission on 10/29/2023.  ROS: 10 point review of systems has been reviewed and considered negative the exception was been listed in HPI  Studies Reviewed EKG Interpretation Date/Time:  Thursday October 24 2023 10:48:33 EDT Ventricular Rate:  90 PR Interval:    QRS Duration:  84 QT Interval:  340 QTC Calculation: 415 R Axis:   -36  Text Interpretation: Atrial fibrillation Left axis deviation When compared with ECG of 25-Sep-2023 12:11, Confirmed by Gerard Frederick ([id number]) on 10/24/2023  10:53:38 AM    Cardiac CT, 04/18/2023 1. There is normal pulmonary vein drainage into the left atrium. (2 on the right and 2 on the left) with ostial measurements as above.  2. The left atrial appendage is a Windsock-cactus type with ostial size 29 x 23 mm and length 31 mm, Area 49 mm2. There is no thrombus in the left atrial appendage.  3. The esophagus runs in the left atrial midline and is not in the proximity to any of the pulmonary veins.  4. Coronary calcium  score of 331. This was 85th percentile for age/gender.     TTE, 07/11/2022  1. Left ventricular ejection fraction, by estimation, is 50 to 55%. The left ventricle has low normal function. The left ventricle has no regional wall motion abnormalities. There is mild left ventricular hypertrophy. Left ventricular diastolic  parameters are indeterminate.   2. Right ventricular systolic function is normal. The right ventricular size is normal. Tricuspid regurgitation signal is inadequate for assessing PA pressure.   3. Left atrial size was moderately dilated.   4. The mitral valve is normal in structure. No evidence of mitral valve regurgitation. No evidence of mitral stenosis.   5. The aortic valve is normal in structure. Aortic valve regurgitation is  not visualized. No aortic stenosis is present. Risk Assessment/Calculations  CHA2DS2-VASc Score = 4   This indicates a 4.8% annual risk of stroke. The patient's score is based upon: CHF History: 0 HTN History: 1 Diabetes History: 1 Stroke History: 0 Vascular Disease History: 0 Age Score: 1 Gender Score: 1            Physical Exam VS:  BP 120/84 (BP Location: Left Arm, Patient Position: Sitting)   Pulse 90   Ht 5' 5 (1.651 m)   Wt 207 lb 9.6 oz (94.2 kg)   SpO2 96%   BMI 34.55 kg/m        Wt Readings from Last 3 Encounters:  10/24/23 207 lb 9.6 oz (94.2 kg)  09/25/23 205 lb (93 kg)  09/10/23 208 lb (94.3 kg)    GEN: Well nourished, well developed in no acute  distress NECK: No JVD; No carotid bruits CARDIAC: IR IR, no murmurs, rubs, gallops RESPIRATORY:  Clear to auscultation without rales, wheezing or rhonchi  ABDOMEN: Soft, non-tender, non-distended EXTREMITIES: Trace pretibial edema; No deformity   ASSESSMENT AND PLAN Persistent atrial fibrillation status post DCCV x 2 with ablation.  She continues to be symptomatic with complaints of fatigue, palpitation, shortness of breath.  EKG today reveals rate controlled atrial fibrillation at a rate of 90 with left axis deviation with no ischemic changes noted from prior studies.  She is continued on apixaban  5 mg twice daily for CHA2DS2-VASc score of at least 4 for stroke prophylaxis, digoxin , metoprolol  tartrate 100 mg twice daily.  Questions were answered about her Tikosyn initiation during hospitalization today at her visit.  Primary hypertension with a blood pressure of 120/84.  Blood pressures remain stable.  She is continued on furosemide  20 mg daily, losartan  25 daily, metoprolol  25 mg twice daily.  She has been encouraged to continue to monitor pressures 1 to 2 hours postmedication administration as well.  Type 2 diabetes with last hemoglobin A1c of 7.8.  She is continued on Rybelsus, metformin , and Jardiance .  Ongoing management per PCP.  Obesity with a BMI of 34.5.  She would benefit from weight loss and has been attempting but is under a lot of stress at this time.  She is advised to continue with her dietary modification and increase her activity as tolerated.  GAD where she is on several different medications being treated by psychiatry.  She has been encouraged to continue to maintain all follow-ups and maintain her current prescribed medication regimen.       Dispo: Patient return to clinic to see MD/APP in 3 months or sooner needed.  Signed, Emerita Berkemeier, NP

## 2023-10-24 NOTE — Patient Instructions (Signed)
 Medication Instructions:  Your physician recommends that you continue on your current medications as directed. Please refer to the Current Medication list given to you today.   *If you need a refill on your cardiac medications before your next appointment, please call your pharmacy*  Lab Work: No labs ordered today  If you have labs (blood work) drawn today and your tests are completely normal, you will receive your results only by: MyChart Message (if you have MyChart) OR A paper copy in the mail If you have any lab test that is abnormal or we need to change your treatment, we will call you to review the results.  Testing/Procedures: No test ordered today   Follow-Up: At [facility], you and your health needs are our priority.  As part of our continuing mission to provide you with exceptional heart care, our providers are all part of one team.  This team includes your primary Cardiologist (physician) and Advanced Practice Providers or APPs (Physician Assistants and Nurse Practitioners) who all work together to provide you with the care you need, when you need it.  Your next appointment:   3 month(s)  Provider:   Deatrice Cage, MD or Tylene Lunch, NP    Primary Care at [facility] Phone : [phone]

## 2023-10-25 ENCOUNTER — Encounter (HOSPITAL_COMMUNITY): Payer: Self-pay

## 2023-10-26 ENCOUNTER — Other Ambulatory Visit: Payer: Self-pay | Admitting: Family Medicine

## 2023-10-26 ENCOUNTER — Other Ambulatory Visit: Payer: Self-pay | Admitting: Cardiology

## 2023-10-26 DIAGNOSIS — K219 Gastro-esophageal reflux disease without esophagitis: Secondary | ICD-10-CM

## 2023-10-26 DIAGNOSIS — F411 Generalized anxiety disorder: Secondary | ICD-10-CM

## 2023-10-28 ENCOUNTER — Other Ambulatory Visit: Payer: Self-pay | Admitting: *Deleted

## 2023-10-28 ENCOUNTER — Other Ambulatory Visit: Payer: Self-pay | Admitting: Psychiatry

## 2023-10-28 NOTE — Telephone Encounter (Signed)
 Could you contact the pharmacy and provide her with an update? She should have enough of all her medications for the next few months.

## 2023-10-28 NOTE — Telephone Encounter (Signed)
 Called patients pharmacy spoke to Angie she stated that the patient does not have any refills on file please advise

## 2023-10-28 NOTE — Telephone Encounter (Signed)
 I only inquired about the Alprazolam  nothing else I spoke to Angie and she gave me that information about the Alprazolam  only

## 2023-10-28 NOTE — Telephone Encounter (Signed)
 Sent patient a [employer] geologist. She needs to scheduled for medication refill.

## 2023-10-28 NOTE — Telephone Encounter (Signed)
 I do not prescribe Xanax  to her. Please ask her which medication she is referring to.

## 2023-10-28 NOTE — Telephone Encounter (Signed)
 I spoke with the pharmacy -- they confirmed there are active orders for both Venlafaxine  150 mg daily and 37.5 mg daily. However, the insurance declined the refill due to it being too soon.  They also noted that Buspar  was filled on 6/16 for a 90-day supply, so the patient should have enough to last until the next scheduled visit. Please provider her with an update, and ask if she has any additional questions.

## 2023-10-29 ENCOUNTER — Encounter (HOSPITAL_COMMUNITY): Payer: Self-pay | Admitting: Physician Assistant

## 2023-10-29 ENCOUNTER — Ambulatory Visit (HOSPITAL_COMMUNITY)
Admission: RE | Admit: 2023-10-29 | Discharge: 2023-10-29 | Disposition: A | Discharge status: Home / Self Care | Source: Ambulatory Visit | Attending: Physician Assistant | Admitting: Physician Assistant

## 2023-10-29 ENCOUNTER — Inpatient Hospital Stay (HOSPITAL_COMMUNITY)
Admission: AD | Admit: 2023-10-29 | Discharge: 2023-11-01 | DRG: 309 | Disposition: A | Discharge status: Home / Self Care | Attending: Cardiology | Admitting: Cardiology

## 2023-10-29 VITALS — BP 148/98 | HR 95 | Ht 65.0 in | Wt 207.6 lb

## 2023-10-29 DIAGNOSIS — K589 Irritable bowel syndrome without diarrhea: Secondary | ICD-10-CM | POA: Diagnosis present

## 2023-10-29 DIAGNOSIS — I10 Essential (primary) hypertension: Secondary | ICD-10-CM | POA: Diagnosis present

## 2023-10-29 DIAGNOSIS — Z79899 Other long term (current) drug therapy: Secondary | ICD-10-CM

## 2023-10-29 DIAGNOSIS — F32A Depression, unspecified: Secondary | ICD-10-CM | POA: Diagnosis present

## 2023-10-29 DIAGNOSIS — E119 Type 2 diabetes mellitus without complications: Secondary | ICD-10-CM | POA: Diagnosis present

## 2023-10-29 DIAGNOSIS — F419 Anxiety disorder, unspecified: Secondary | ICD-10-CM | POA: Diagnosis not present

## 2023-10-29 DIAGNOSIS — E669 Obesity, unspecified: Secondary | ICD-10-CM | POA: Diagnosis not present

## 2023-10-29 DIAGNOSIS — D6869 Other thrombophilia: Secondary | ICD-10-CM | POA: Diagnosis present

## 2023-10-29 DIAGNOSIS — E876 Hypokalemia: Secondary | ICD-10-CM | POA: Diagnosis not present

## 2023-10-29 DIAGNOSIS — F418 Other specified anxiety disorders: Secondary | ICD-10-CM | POA: Diagnosis not present

## 2023-10-29 DIAGNOSIS — I4819 Other persistent atrial fibrillation: Principal | ICD-10-CM

## 2023-10-29 DIAGNOSIS — Z87891 Personal history of nicotine dependence: Secondary | ICD-10-CM | POA: Diagnosis not present

## 2023-10-29 DIAGNOSIS — Z6835 Body mass index (BMI) 35.0-35.9, adult: Secondary | ICD-10-CM | POA: Diagnosis not present

## 2023-10-29 DIAGNOSIS — Z7901 Long term (current) use of anticoagulants: Secondary | ICD-10-CM | POA: Diagnosis not present

## 2023-10-29 DIAGNOSIS — K219 Gastro-esophageal reflux disease without esophagitis: Secondary | ICD-10-CM | POA: Diagnosis present

## 2023-10-29 DIAGNOSIS — I4891 Unspecified atrial fibrillation: Secondary | ICD-10-CM | POA: Diagnosis not present

## 2023-10-29 LAB — BASIC METABOLIC PANEL WITH GFR
BUN/Creatinine Ratio: 20 (ref 12–28)
BUN: 11 mg/dL (ref 8–27)
CO2: 24 mmol/L (ref 20–29)
Calcium: 9.9 mg/dL (ref 8.7–10.3)
Chloride: 99 mmol/L (ref 96–106)
Creatinine, Ser: 0.56 mg/dL — ABNORMAL LOW (ref 0.57–1.00)
Glucose: 168 mg/dL — ABNORMAL HIGH (ref 70–99)
Potassium: 4.5 mmol/L (ref 3.5–5.2)
Sodium: 135 mmol/L (ref 134–144)
eGFR: 97 mL/min/1.73 (ref >59)

## 2023-10-29 LAB — MAGNESIUM: Magnesium: 1.9 mg/dL (ref 1.6–2.3)

## 2023-10-29 MED ORDER — ADULT MULTIVITAMIN W/MINERALS CH
1.0000 | ORAL_TABLET | Freq: Every day | ORAL | Status: DC
  Administered 2023-10-30 – 2023-11-01 (×3): 1 via ORAL
  Filled 2023-10-29 (×3): qty 1

## 2023-10-29 MED ORDER — PANTOPRAZOLE SODIUM 40 MG PO TBEC
40.0000 mg | DELAYED_RELEASE_TABLET | Freq: Two times a day (BID) | ORAL | Status: DC
  Administered 2023-10-29 – 2023-11-01 (×6): 40 mg via ORAL
  Filled 2023-10-29 (×6): qty 1

## 2023-10-29 MED ORDER — DOFETILIDE 500 MCG PO CAPS
500.0000 ug | ORAL_CAPSULE | Freq: Two times a day (BID) | ORAL | Status: DC
  Administered 2023-10-29 – 2023-11-01 (×6): 500 ug via ORAL
  Filled 2023-10-29 (×6): qty 1

## 2023-10-29 MED ORDER — CLOBETASOL PROPIONATE 0.05 % EX OINT
TOPICAL_OINTMENT | Freq: Two times a day (BID) | CUTANEOUS | Status: DC
  Filled 2023-10-29: qty 15

## 2023-10-29 MED ORDER — METOPROLOL TARTRATE 100 MG PO TABS
100.0000 mg | ORAL_TABLET | Freq: Two times a day (BID) | ORAL | Status: DC
  Administered 2023-10-29 – 2023-11-01 (×6): 100 mg via ORAL
  Filled 2023-10-29 (×6): qty 1

## 2023-10-29 MED ORDER — FUROSEMIDE 20 MG PO TABS: 20.0000 mg | ORAL_TABLET | Freq: Every day | ORAL | Status: DC | PRN

## 2023-10-29 MED ORDER — BUSPIRONE HCL 5 MG PO TABS
5.0000 mg | ORAL_TABLET | Freq: Two times a day (BID) | ORAL | Status: DC
  Administered 2023-10-29 – 2023-11-01 (×6): 5 mg via ORAL
  Filled 2023-10-29 (×6): qty 1

## 2023-10-29 MED ORDER — SODIUM CHLORIDE 0.9% FLUSH
3.0000 mL | INTRAVENOUS | Status: DC | PRN
Start: 2023-10-29 — End: 2023-11-01

## 2023-10-29 MED ORDER — ALPRAZOLAM 0.5 MG PO TABS
0.5000 mg | ORAL_TABLET | Freq: Once | ORAL | Status: AC | PRN
  Administered 2023-10-30: 0.5 mg via ORAL
  Filled 2023-10-29: qty 1

## 2023-10-29 MED ORDER — SEMAGLUTIDE 14 MG PO TABS
1.0000 | ORAL_TABLET | Freq: Every morning | ORAL | Status: DC
  Administered 2023-10-31: 14 mg via ORAL
  Filled 2023-10-29 (×2): qty 1

## 2023-10-29 MED ORDER — ACETAMINOPHEN 500 MG PO TABS
1000.0000 mg | ORAL_TABLET | Freq: Three times a day (TID) | ORAL | Status: DC | PRN
  Administered 2023-10-29 – 2023-10-31 (×2): 1000 mg via ORAL
  Filled 2023-10-29 (×2): qty 2

## 2023-10-29 MED ORDER — METFORMIN HCL ER 500 MG PO TB24
500.0000 mg | ORAL_TABLET | Freq: Two times a day (BID) | ORAL | Status: DC
  Administered 2023-10-29 – 2023-11-01 (×6): 500 mg via ORAL
  Filled 2023-10-29 (×7): qty 1

## 2023-10-29 MED ORDER — VENLAFAXINE HCL ER 37.5 MG PO CP24
37.5000 mg | ORAL_CAPSULE | Freq: Every day | ORAL | Status: DC
  Administered 2023-10-29 – 2023-10-31 (×3): 37.5 mg via ORAL
  Filled 2023-10-29 (×4): qty 1

## 2023-10-29 MED ORDER — OYSTER SHELL CALCIUM/D3 500-5 MG-MCG PO TABS
1.0000 | ORAL_TABLET | Freq: Two times a day (BID) | ORAL | Status: DC
  Administered 2023-10-29 – 2023-11-01 (×6): 1 via ORAL
  Filled 2023-10-29 (×9): qty 1

## 2023-10-29 MED ORDER — VITAMIN D 25 MCG (1000 UNIT) PO TABS
2000.0000 [IU] | ORAL_TABLET | Freq: Every day | ORAL | Status: DC
  Administered 2023-10-30 – 2023-11-01 (×3): 2000 [IU] via ORAL
  Filled 2023-10-29 (×4): qty 2

## 2023-10-29 MED ORDER — SODIUM CHLORIDE 0.9% FLUSH
3.0000 mL | Freq: Two times a day (BID) | INTRAVENOUS | Status: DC
  Administered 2023-10-30 – 2023-11-01 (×5): 3 mL via INTRAVENOUS

## 2023-10-29 MED ORDER — VENLAFAXINE HCL ER 150 MG PO CP24
150.0000 mg | ORAL_CAPSULE | Freq: Every day | ORAL | Status: DC
  Administered 2023-10-30 – 2023-11-01 (×3): 150 mg via ORAL
  Filled 2023-10-29 (×3): qty 1

## 2023-10-29 MED ORDER — LOSARTAN POTASSIUM 25 MG PO TABS
25.0000 mg | ORAL_TABLET | Freq: Every day | ORAL | Status: DC
  Administered 2023-10-30 – 2023-11-01 (×3): 25 mg via ORAL
  Filled 2023-10-29 (×3): qty 1

## 2023-10-29 MED ORDER — LINACLOTIDE 145 MCG PO CAPS
290.0000 ug | ORAL_CAPSULE | Freq: Every day | ORAL | Status: DC
  Administered 2023-10-31 – 2023-11-01 (×2): 290 ug via ORAL
  Filled 2023-10-29 (×2): qty 2

## 2023-10-29 MED ORDER — MUPIROCIN 2 % EX OINT
TOPICAL_OINTMENT | Freq: Two times a day (BID) | CUTANEOUS | Status: DC
  Filled 2023-10-29: qty 22

## 2023-10-29 MED ORDER — DIGOXIN 125 MCG PO TABS
250.0000 ug | ORAL_TABLET | Freq: Every day | ORAL | Status: DC
  Administered 2023-10-30 – 2023-11-01 (×3): 250 ug via ORAL
  Filled 2023-10-29 (×3): qty 2

## 2023-10-29 MED ORDER — MAGNESIUM SULFATE 2 GM/50ML IV SOLN
2.0000 g | Freq: Once | INTRAVENOUS | Status: AC
  Administered 2023-10-29: 2 g via INTRAVENOUS
  Filled 2023-10-29: qty 50

## 2023-10-29 MED ORDER — DOCUSATE SODIUM 100 MG PO CAPS
200.0000 mg | ORAL_CAPSULE | Freq: Every day | ORAL | Status: DC
  Administered 2023-10-29 – 2023-10-31 (×3): 200 mg via ORAL
  Filled 2023-10-29 (×3): qty 2

## 2023-10-29 MED ORDER — SODIUM CHLORIDE 0.9 % IV SOLN: 250.0000 mL | INTRAVENOUS | Status: AC | PRN

## 2023-10-29 MED ORDER — APIXABAN 5 MG PO TABS
5.0000 mg | ORAL_TABLET | Freq: Two times a day (BID) | ORAL | Status: DC
  Administered 2023-10-29 – 2023-11-01 (×6): 5 mg via ORAL
  Filled 2023-10-29 (×6): qty 1

## 2023-10-29 NOTE — Progress Notes (Signed)
 Post Tikosyn  EKG showing AFIB HR 80 Qtc 442

## 2023-10-29 NOTE — Progress Notes (Signed)
 Primary Care Physician: Wallace Joesph LABOR, PA Primary Cardiologist: Deatrice Cage, MD Electrophysiologist: [name], MD  Referring Physician: Dr HOLTS [place] Lisa Gutierrez is a 73 y.o. female with a history of HTN, DM, atrial fibrillation who presents for follow up in the [facility].  The patient is s/p afib ablation 05/02/23. She had afib post ablation and underwent DCCV 06/03/23. However, she was seen by Dr HOLTS and was back in afib again, dofetilide  recommended. Patient is on Eliquis  for stroke prevention.    Patient presents today for follow up for atrial fibrillation and dofetilide  admission. She remains in afib with symptoms of fatigue and intermittent dizziness. She denies any missed doses of anticoagulation in the past 3 weeks.   Today, she denies symptoms of palpitations, chest pain, shortness of breath, orthopnea, PND, lower extremity edema, presyncope, syncope, snoring, daytime somnolence, bleeding, or neurologic sequela. The patient is tolerating medications without difficulties and is otherwise without complaint today.    Atrial Fibrillation Risk Factors:  she does not have symptoms or diagnosis of sleep apnea. she does not have a history of rheumatic fever.   Atrial Fibrillation Management history:  Previous antiarrhythmic drugs: none Previous cardioversions: 09/03/22, 06/03/23 Previous ablations: 05/02/23 Anticoagulation history: Eliquis   ROS- All systems are reviewed and negative except as per the HPI above.  Past Medical History:  Diagnosis Date   Anxiety    Atrial fibrillation (HCC)    Depression    Depression    [employer]reesia 07/02/2020   Diabetes mellitus without complication (HCC)    GERD (gastroesophageal reflux disease)    Hypertension    IBS (irritable bowel syndrome)    Neuromuscular disorder (HCC)     Current Outpatient Medications  Medication Sig Dispense Refill   acetaminophen  (TYLENOL ) 500 MG tablet  Take 1,000 mg by mouth every 8 (eight) hours as needed for mild pain (pain score 1-3), moderate pain (pain score 4-6) or headache.     ALPRAZolam  (XANAX ) 0.5 MG tablet Take 1 tablet (0.5 mg total) by mouth at bedtime as needed. 30 tablet 1   apixaban  (ELIQUIS ) 5 MG TABS tablet Take 1 tablet (5 mg total) by mouth 2 (two) times daily. 60 tablet 11   BLACK ELDERBERRY PO Take 1 tablet by mouth daily.     Blood Glucose Monitoring Suppl (ONE TOUCH ULTRA 2) w/Device KIT CHECK IN THE MORNING, AT NOON, AND AT BEDTIME. MAY SUBSTITUTE TO ANY MANUFACTURER COVERED BY PATIENT'S INSURANCE. 300 kit 3   busPIRone  (BUSPAR ) 5 MG tablet Take 5 mg by mouth 2 (two) times daily.     busPIRone  (BUSPAR ) 5 MG tablet Take 1 tablet (5 mg total) by mouth 2 (two) times daily. 180 tablet 0   Calcium  Carb-Cholecalciferol  (CALCIUM  600/VITAMIN D3) 600-20 MG-MCG TABS Take 1 tablet by mouth 2 (two) times daily.     Cholecalciferol  (VITAMIN D3) 50 MCG (2000 UT) capsule Take 2,000 Units by mouth daily.     clobetasol  ointment (TEMOVATE ) 0.05 % Apply topically 2 (two) times daily. Apply topically to affected twice daily until improved. (Patient taking differently: Apply 1 Application topically 2 (two) times daily as needed (progeria nodularis).) 30 g 2   digoxin  (LANOXIN ) 0.25 MG tablet TAKE 1 TABLET BY MOUTH EVERY DAY 90 tablet 3   Docusate Calcium  (STOOL SOFTENER PO) Take 2 tablets by mouth at bedtime.     empagliflozin  (JARDIANCE ) 25 MG TABS tablet Take 1 tablet (25 mg total) by mouth daily before breakfast.  90 tablet 1   Flaxseed, Linseed, (FLAXSEED OIL) 1400 MG CAPS Take 1,400 mg by mouth 2 (two) times daily.     furosemide  (LASIX ) 20 MG tablet TAKE 1 TABLET BY MOUTH EVERY DAY 30 tablet 6   Glucose Blood (BLOOD GLUCOSE TEST STRIPS) STRP 1 each by In Vitro route in the morning, at noon, and at bedtime. May substitute to any manufacturer covered by patient's insurance. 100 strip 9   hydrocortisone  (ANUSOL -HC) 25 MG suppository Place 1  suppository (25 mg total) rectally 2 (two) times daily. (Patient taking differently: Place 25 mg rectally 2 (two) times daily as needed.) 12 suppository 1   linaclotide  (LINZESS ) 290 MCG CAPS capsule Take 1 capsule (290 mcg total) by mouth daily before breakfast. 30 capsule 2   losartan  (COZAAR ) 25 MG tablet Take 1 tablet (25 mg total) by mouth daily. 90 tablet 3   metFORMIN  (GLUCOPHAGE -XR) 500 MG 24 hr tablet TAKE 1 TABLET BY MOUTH TWICE A DAY WITH FOOD 60 tablet 1   metoprolol  tartrate (LOPRESSOR ) 100 MG tablet Take 1 tablet (100 mg total) by mouth 2 (two) times daily. 180 tablet 3   Multiple Vitamins-Minerals (MULTIVITAMIN ADULT PO) Take 1 tablet by mouth daily. One a day     mupirocin  ointment (BACTROBAN ) 2 % Apply topically 2 (two) times daily.     pantoprazole  (PROTONIX ) 40 MG tablet TAKE 1 TABLET BY MOUTH TWICE A DAY 180 tablet 1   RYBELSUS  14 MG TABS Take 1 tablet by mouth every morning.     [START ON 11/02/2023] venlafaxine  XR (EFFEXOR -XR) 150 MG 24 hr capsule Take 1 capsule (150 mg total) by mouth daily with breakfast. Take total of 187.5 mg daily. Take along with 37.5 mg cap 90 capsule 0   [START ON 11/02/2023] venlafaxine  XR (EFFEXOR -XR) 37.5 MG 24 hr capsule Take 1 capsule (37.5 mg total) by mouth daily with breakfast. Take total of 187.5 mg daily. Take along with 150 mg cap 90 capsule 0   glucose blood test strip Use as instructed 100 each 12   No current facility-administered medications for this encounter.    Physical Exam: BP (!) 148/98   Pulse 95   Ht 5' 5 (1.651 m)   Wt 94.2 kg   BMI 34.55 kg/m   GEN: Well nourished, well developed in no acute distress NECK: No JVD CARDIAC: Irregularly irregular rate and rhythm, no murmurs, rubs, gallops RESPIRATORY:  Clear to auscultation without rales, wheezing or rhonchi  ABDOMEN: Soft, non-tender, non-distended EXTREMITIES:  No edema; No deformity   Wt Readings from Last 3 Encounters:  10/29/23 94.2 kg  10/24/23 94.2 kg   09/25/23 93 kg     EKG today demonstrates  Afib Vent. rate 95 BPM PR interval * ms QRS duration 84 ms QT/QTcB 364/457 ms   Echo 07/10/22 demonstrated   1. Left ventricular ejection fraction, by estimation, is 50 to 55%. The  left ventricle has low normal function. The left ventricle has no regional  wall motion abnormalities. There is mild left ventricular hypertrophy.  Left ventricular diastolic parameters are indeterminate.   2. Right ventricular systolic function is normal. The right ventricular  size is normal. Tricuspid regurgitation signal is inadequate for assessing  PA pressure.   3. Left atrial size was moderately dilated.   4. The mitral valve is normal in structure. No evidence of mitral valve  regurgitation. No evidence of mitral stenosis.   5. The aortic valve is normal in structure. Aortic valve regurgitation is  not visualized. No aortic stenosis is present.    CHA2DS2-VASc Score = 4  The patient's score is based upon: CHF History: 0 HTN History: 1 Diabetes History: 1 Stroke History: 0 Vascular Disease History: 0 Age Score: 1 Gender Score: 1       ASSESSMENT AND PLAN: Persistent Atrial Fibrillation (ICD10:  I48.19) The patient's CHA2DS2-VASc score is 4, indicating a 4.8% annual risk of stroke.   Patient remains in persistent afib Patient presents for dofetilide  admission Continue Eliquis  5 mg BID, states no missed doses in the last 3 weeks. No recent benadryl use PharmD has screened medications for QT prolonging agents.  QTc in SR 428 ms  Secondary Hypercoagulable State (ICD10:  D68.69) The patient is at significant risk for stroke/thromboembolism based upon her CHA2DS2-VASc Score of 4.  Continue Apixaban  (Eliquis ). No bleeding issues.   HTN Stable on current regimen  Obesity Body mass index is 34.55 kg/m.  Encouraged lifestyle modification   To be admitted later today once a bed becomes available.     [facility] [address] [place],  [address] [phone]

## 2023-10-29 NOTE — H&P (Signed)
 Primary Care Physician: Wallace Joesph LABOR, PA Primary Cardiologist: Deatrice Cage, MD Electrophysiologist: [name], MD  Referring Physician: Dr HOLTS [place] Lisa Gutierrez is a 73 y.o. female with a history of HTN, DM, atrial fibrillation who presents for follow up in the [facility].  The patient is s/p afib ablation 05/02/23. She had afib post ablation and underwent DCCV 06/03/23. However, she was seen by Dr HOLTS and was back in afib again, dofetilide  recommended. Patient is on Eliquis  for stroke prevention.    Patient presents today for follow up for atrial fibrillation and dofetilide  admission. She remains in afib with symptoms of fatigue and intermittent dizziness. She denies any missed doses of anticoagulation in the past 3 weeks.   Today, she denies symptoms of palpitations, chest pain, shortness of breath, orthopnea, PND, lower extremity edema, presyncope, syncope, snoring, daytime somnolence, bleeding, or neurologic sequela. The patient is tolerating medications without difficulties and is otherwise without complaint today.    Atrial Fibrillation Risk Factors:  she does not have symptoms or diagnosis of sleep apnea. she does not have a history of rheumatic fever.   Atrial Fibrillation Management history:  Previous antiarrhythmic drugs: none Previous cardioversions: 09/03/22, 06/03/23 Previous ablations: 05/02/23 Anticoagulation history: Eliquis   ROS- All systems are reviewed and negative except as per the HPI above.  Past Medical History:  Diagnosis Date   Anxiety    Atrial fibrillation (HCC)    Depression    Depression    Phreesia 07/02/2020   Diabetes mellitus without complication (HCC)    GERD (gastroesophageal reflux disease)    Hypertension    IBS (irritable bowel syndrome)    Neuromuscular disorder (HCC)     Current Facility-Administered Medications  Medication Dose Route Frequency Provider Last Rate Last Admin    0.9 %  sodium chloride  infusion  250 mL Intravenous PRN HOLTS OLE ONEIDA, MD       acetaminophen  (TYLENOL ) tablet 1,000 mg  1,000 mg Oral Q8H PRN Fenton, Clint R, PA       apixaban  (ELIQUIS ) tablet 5 mg  5 mg Oral BID HOLTS OLE ONEIDA, MD       busPIRone  (BUSPAR ) tablet 5 mg  5 mg Oral BID Fenton, Clint R, PA       calcium -vitamin D  (OSCAL WITH D) 500-5 MG-MCG per tablet 1 tablet  1 tablet Oral BID Fenton, Clint R, PA       [START ON 10/30/2023] cholecalciferol  (VITAMIN D3) tablet 2,000 Units  2,000 Units Oral Daily Fenton, Clint R, PA       clobetasol  ointment (TEMOVATE ) 0.05 %   Topical BID Fenton, Clint R, PA       [START ON 10/30/2023] digoxin  (LANOXIN ) tablet 250 mcg  250 mcg Oral Daily Fenton, Clint R, PA       docusate sodium  (COLACE) capsule 200 mg  200 mg Oral QHS Fenton, Clint R, PA       dofetilide  (TIKOSYN ) capsule 500 mcg  500 mcg Oral BID HOLTS OLE ONEIDA, MD       furosemide  (LASIX ) tablet 20 mg  20 mg Oral Daily PRN Fenton, Clint R, PA       [START ON 10/31/2023] linaclotide  (LINZESS ) capsule 290 mcg  290 mcg Oral QAC breakfast Fenton, Clint R, PA       [START ON 10/30/2023] losartan  (COZAAR ) tablet 25 mg  25 mg Oral Daily Fenton, Clint R, PA       metFORMIN  (GLUCOPHAGE -XR) 24 hr  tablet 500 mg  500 mg Oral BID WC Fenton, Clint R, PA       metoprolol  tartrate (LOPRESSOR ) tablet 100 mg  100 mg Oral BID Fenton, Clint R, PA       [START ON 10/30/2023] Multivitamin Adult TABS   Oral Daily Fenton, Clint R, PA       mupirocin  ointment (BACTROBAN ) 2 %   Topical BID Fenton, Clint R, PA       pantoprazole  (PROTONIX ) EC tablet 40 mg  40 mg Oral BID Fenton, Clint R, PA       [START ON 10/30/2023] Semaglutide  TABS 14 mg  1 tablet Oral q morning Fenton, Clint R, PA       sodium chloride  flush (NS) 0.9 % injection 3 mL  3 mL Intravenous Q12H Cindie Ole DASEN, MD       sodium chloride  flush (NS) 0.9 % injection 3 mL  3 mL Intravenous PRN Cindie Ole DASEN, MD       [START ON 10/30/2023]  venlafaxine  XR (EFFEXOR -XR) 24 hr capsule 150 mg  150 mg Oral Q breakfast Fenton, Clint R, PA       venlafaxine  XR (EFFEXOR -XR) 24 hr capsule 37.5 mg  37.5 mg Oral QHS Fenton, Clint R, PA        Physical Exam: There were no vitals taken for this visit.  GEN: Well nourished, well developed in no acute distress NECK: No JVD CARDIAC: Irregularly irregular rate and rhythm, no murmurs, rubs, gallops RESPIRATORY:  Clear to auscultation without rales, wheezing or rhonchi  ABDOMEN: Soft, non-tender, non-distended EXTREMITIES:  No edema; No deformity   Wt Readings from Last 3 Encounters:  10/29/23 94.2 kg  10/24/23 94.2 kg  09/25/23 93 kg     EKG today demonstrates  Afib Vent. rate 95 BPM PR interval * ms QRS duration 84 ms QT/QTcB 364/457 ms   Echo 07/10/22 demonstrated   1. Left ventricular ejection fraction, by estimation, is 50 to 55%. The  left ventricle has low normal function. The left ventricle has no regional  wall motion abnormalities. There is mild left ventricular hypertrophy.  Left ventricular diastolic parameters are indeterminate.   2. Right ventricular systolic function is normal. The right ventricular  size is normal. Tricuspid regurgitation signal is inadequate for assessing  PA pressure.   3. Left atrial size was moderately dilated.   4. The mitral valve is normal in structure. No evidence of mitral valve  regurgitation. No evidence of mitral stenosis.   5. The aortic valve is normal in structure. Aortic valve regurgitation is  not visualized. No aortic stenosis is present.    CHA2DS2-VASc Score = 4  The patient's score is based upon: CHF History: 0 HTN History: 1 Diabetes History: 1 Stroke History: 0 Vascular Disease History: 0 Age Score: 1 Gender Score: 1       ASSESSMENT AND PLAN: Persistent Atrial Fibrillation (ICD10:  I48.19) The patient's CHA2DS2-VASc score is 4, indicating a 4.8% annual risk of stroke.   Patient remains in persistent  afib Patient presents for dofetilide  admission Continue Eliquis  5 mg BID, states no missed doses in the last 3 weeks. No recent benadryl use PharmD has screened medications for QT prolonging agents.  QTc in SR 428 ms  Secondary Hypercoagulable State (ICD10:  D68.69) The patient is at significant risk for stroke/thromboembolism based upon her CHA2DS2-VASc Score of 4.  Continue Apixaban  (Eliquis ). No bleeding issues.   HTN Stable on current regimen  Obesity There is no height  or weight on file to calculate BMI.  Encouraged lifestyle modification   To be admitted later today once a bed becomes available.     Daril Kicks [facility] [address] [place], [place] [address] [phone]  I have seen and examined this patient with Daril Kicks.  Agree with above, note added to reflect my findings.  Patient with a past medical history as above.  She has had an atrial fibrillation ablation January 2025.  After ablation, she had recurrence of her atrial fibrillation.  She was seen in clinic and discussed rhythm control and opted for dofetilide .  Today, she feels well other than mild fatigue and intermittent dizziness.  She remains in atrial fibrillation.  GEN: No acute distress.   Neck: No JVD Cardiac: Irregular Respiratory: Normal work of breathing GI: Soft, nontender, non-distended  MS: No edema; No deformity. Neuro:  Nonfocal  Skin: warm and dry Psych: Normal affect    Persistent atrial fibrillation: Post ablation.  She has had recurrence of her atrial fibrillation.  She presents to the hospital for admission for dofetilide  load.  QTc acceptable.  Krisna Omar plan for 500 mcg twice daily starting tonight.  Check potassium and magnesium  daily and keep potassium> 2, magnesium > 4.  If she does not convert, Silverio Hagan plan for cardioversion Thursday. Secondary hypercoagulable state: Continue Eliquis  hypertension: Continue home medications Diabetes: Continue home medications   Gara Kincade M.  Uriyah Raska MD 10/29/2023 5:07 PM

## 2023-10-29 NOTE — Progress Notes (Signed)
 Pharmacy: Dofetilide  (Tikosyn ) - Initial Consult Assessment and Electrolyte Replacement  Pharmacy consulted to assist in monitoring and replacing electrolytes in this 73 y.o. female admitted on 10/29/2023 undergoing dofetilide  initiation. First dofetilide  dose: 7/15 2000  Assessment:  Patient Exclusion Criteria: If any screening criteria checked as Yes, then  patient  should NOT receive dofetilide  until criteria item is corrected.  If "Yes" please indicate correction plan.  YES  NO Patient  Exclusion Criteria Correction Plan/Comments   []   [x]   Baseline QTc interval is greater than or equal to 440 msec. IF above YES box checked dofetilide  contraindicated unless patient has ICD; then may proceed if QTc 500-550 msec or with known ventricular conduction abnormalities may proceed with QTc 550-600 msec. QTc =   428 in sinus rhythm    []   [x]   Patient is known or suspected to have a digoxin  level greater than 2 ng/ml: Lab Results  Component Value Date   DIGOXIN  0.6 (L) 07/12/2022       []   [x]   Creatinine clearance less than 20 ml/min (calculated using Cockcroft-Gault, actual body weight and serum creatinine): Estimated Creatinine Clearance: 72.1 mL/min (A) (by C-G formula based on SCr of 0.56 mg/dL (L)).     []   [x]  Patient has received drugs known to prolong the QT intervals within the last 48 hour (examples: phenothiazines, tricyclics or tetracyclic antidepressants, macrolides, 1st generation H-1 antihistamines (especially diphenhydramine), fluoroquinolones, azoles, ondansetron , metoclopramide, promethazine).   Updated information on QT prolonging agents is available to be searched on the following database:QT prolonging agents -If SSRI or antihistamine needed, preferred options are sertraline and loratadine respectively     []   [x]  Patient received a dose of a thiazide diuretic in the last 48 hours [including hydrochlorothiazide (Oretic) alone or in any combination including  triamterene (Dyazide, Maxzide)].    []   [x]  Patient received a medication known to increase dofetilide  plasma concentrations prior to initial dofetilide  dose:  Trimethoprim (Primsol, Proloprim) in the last 36 hours Verapamil (Calan, Verelan) in the last 36 hours or a sustained release dose in the last 72 hours Megestrol (Megace) in the last 5 days  Cimetidine (Tagamet) in the last 6 hours Ketoconazole (Nizoral) in the last 24 hours Itraconazole (Sporanox) in the last 48 hours  Prochlorperazine (Compazine) in the last 36 hours     []   [x]   Patient is known to have a history of torsades de pointes; congenital or acquired long QT syndromes.    []   [x]   Patient has received a Class 1 and Class 3 antiarrhythmic with less than 2 half-lives since last dose. (Disopyramide, Quinidine, Procainamide, Lidocaine , Mexiletine, Flecainide, Propafenone, Sotalol, Dronedarone)    []   [x]   Patient has received amiodarone therapy in the past 3 months or amiodarone level is greater than 0.3 ng/ml.    Labs:    Component Value Date/Time   K 4.5 10/29/2023 1000   K 3.5 08/03/2013 1542   MG 1.9 10/29/2023 1000     Plan: Select One Calculated CrCl  Dose q12h  [x]  > 60 ml/min 500 mcg  []  40-60 ml/min 250 mcg  []  20-40 ml/min 125 mcg   [x]   Physician selected initial dose within range recommended for patients level of renal function - will monitor for response.  []   Physician selected initial dose outside of range recommended for patients level of renal function - will discuss if the dose should be altered at this time.   Patient has been appropriately anticoagulated with apixaban .  No doses misseed in past 3 weeks.  Potassium: K >/= 4: Appropriate to initiate Tikosyn , no replacement needed    Magnesium : Mg 1.8-2: Give Mg 2 gm IV x1 to prevent Mg from dropping below 1.8 - do not need to recheck Mg. Appropriate to initiate Tikosyn   Thank you for allowing pharmacy to participate in this patient's  care   Randall Call, PharmD, BCPS 10/29/23 5:06 PM

## 2023-10-29 NOTE — Telephone Encounter (Signed)
 Patient apologized she was confused about the provider she will call the prescribing provider and have her to refill the alprazolam 

## 2023-10-30 ENCOUNTER — Telehealth: Payer: Self-pay | Admitting: Pharmacist

## 2023-10-30 ENCOUNTER — Other Ambulatory Visit (HOSPITAL_COMMUNITY): Payer: Self-pay

## 2023-10-30 LAB — BASIC METABOLIC PANEL WITH GFR
Anion gap: 12 (ref 5–15)
BUN: 10 mg/dL (ref 8–23)
CO2: 25 mmol/L (ref 22–32)
Calcium: 10.2 mg/dL (ref 8.9–10.3)
Chloride: 101 mmol/L (ref 98–111)
Creatinine, Ser: 0.64 mg/dL (ref 0.44–1.00)
GFR, Estimated: >60 mL/min (ref >60)
Glucose, Bld: 192 mg/dL — ABNORMAL HIGH (ref 70–99)
Potassium: 3.9 mmol/L (ref 3.5–5.1)
Sodium: 138 mmol/L (ref 135–145)

## 2023-10-30 LAB — MAGNESIUM: Magnesium: 2.1 mg/dL (ref 1.7–2.4)

## 2023-10-30 MED ORDER — POTASSIUM CHLORIDE CRYS ER 20 MEQ PO TBCR
40.0000 meq | EXTENDED_RELEASE_TABLET | Freq: Once | ORAL | Status: AC
  Administered 2023-10-30: 40 meq via ORAL
  Filled 2023-10-30: qty 2

## 2023-10-30 MED ORDER — ALPRAZOLAM 0.5 MG PO TABS: 0.5000 mg | ORAL_TABLET | Freq: Every evening | ORAL | Status: DC | PRN

## 2023-10-30 MED ORDER — ALPRAZOLAM 0.5 MG PO TABS
0.5000 mg | ORAL_TABLET | Freq: Every day | ORAL | Status: DC
  Administered 2023-10-30 – 2023-10-31 (×2): 0.5 mg via ORAL
  Filled 2023-10-30 (×2): qty 1

## 2023-10-30 NOTE — Progress Notes (Signed)
 Pharmacy: Dofetilide  (Tikosyn ) - Follow Up Assessment and Electrolyte Replacement  Pharmacy consulted to assist in monitoring and replacing electrolytes in this 73 y.o. female admitted on 10/29/2023 undergoing dofetilide  initiation. First dofetilide  dose: 7/15  Labs:    Component Value Date/Time   K 3.9 10/30/2023 0405   K 3.5 08/03/2013 1542   MG 2.1 10/30/2023 0405     Plan: Potassium: K 3.8-3.9:  Give KCl 40 mEq po x1   Magnesium : Mg > 2: No additional supplementation needed   Thank you for allowing pharmacy to participate in this patient's care   Amon Rocher, PharmD, RPh PGY-1 Pharmacy Resident [facility] 10/30/2023 11:23 AM

## 2023-10-30 NOTE — Progress Notes (Unsigned)
   10/30/2023  Patient ID: Lisa Gutierrez, female   DOB: 05-25-1950, 73 y.o.   MRN: [id number]  Received call from patient today regarding need for a refill on Xanax . Requesting one-time refill since patient's old PCP, Joesph Sear, is no longer with the office. Has been meeting with Cardiology frequently and is currently in the hospital for Tikosyn  initiation currently.   Behavioral health does not prescribe this and denied request.  Working on getting a new PCP at a closer office location. Regarding Tikosyn , cost should be $0. Kate has been notified regarding PAP initiation if cost does come back higher.    Aloysius Lewis, PharmD [facility]  Phone Number: [phone]

## 2023-10-30 NOTE — TOC CM/SW Note (Signed)
 Transition of Care [facility]) - Inpatient Brief Assessment   Patient Details  Name: Lisa Gutierrez MRN: [id number] Date of Birth: Dec 29, 1950  Transition of Care [facility]) CM/SW Contact:    Sudie Erminio Deems, RN Phone Number: 10/30/2023, 1:31 PM   Clinical Narrative: Patient presented for Tikosyn  Load. Case Manager spoke with the patient regarding co pay cost. Patient is agreeable to cost and would like to have the initial Rx filled via [facility] and the Rx refills 9- day supply escribed to [facility] [address] [place]. Patient had questions regarding Alprazolam  stating that she no longer has a provide to write the Rx. Patient asked Case Manager to look for a PCP in [place] that is accepting new patients. Case Manager has asked CMA to see if they can schedule a new patient appointment.  No further needs identified at this time.   Transition of Care Asessment: Insurance and Status: Insurance coverage has been reviewed Patient has primary care physician: No (CMA working on PCP in [place]) Home environment has been reviewed: reviewed Prior level of function:: independent Prior/Current Home Services: No current home services Social Drivers of Health Review: SDOH reviewed no interventions necessary Readmission risk has been reviewed: Yes Transition of care needs: no transition of care needs at this time

## 2023-10-30 NOTE — H&P (View-Only) (Signed)
 Morning EKG reviewed     Shows pt remains in afib with stable QTc at 424 ms.  Continue  Tikosyn  500 mcg BID.   Potassium3.9 (07/16 0405) Magnesium   2.1 (07/16 0405) Creatinine, ser  0.64 (07/16 0405)  Pt will be NPO after midnight for DCCV if remains in afib. Orders placed.   Daphne Barrack, NP-C, AGACNP-BC [facility] - Electrophysiology  10/30/2023, 12:26 PM

## 2023-10-30 NOTE — Progress Notes (Signed)
 Electrophysiology Rounding Note  Patient Name: Lisa Gutierrez Date of Encounter: 10/30/2023  Primary Cardiologist: Deatrice Cage, MD  Electrophysiologist: [name], MD    Subjective   Pt remains in afib on Tikosyn  500 mcg BID   QTc from EKG last pm shows stable QTc at 442 ms  The patient is doing well today.  At this time, the patient denies chest pain, shortness of breath, or any new concerns.  Inpatient Medications    Scheduled Meds:  apixaban   5 mg Oral BID   busPIRone   5 mg Oral BID   calcium -vitamin D   1 tablet Oral BID   cholecalciferol   2,000 Units Oral Daily   clobetasol  ointment   Topical BID   digoxin   250 mcg Oral Daily   docusate sodium   200 mg Oral QHS   dofetilide   500 mcg Oral BID   [START ON 10/31/2023] linaclotide   290 mcg Oral QAC breakfast   losartan   25 mg Oral Daily   metFORMIN   500 mg Oral BID WC   metoprolol  tartrate  100 mg Oral BID   multivitamin with minerals  1 tablet Oral Daily   mupirocin  ointment   Topical BID   pantoprazole   40 mg Oral BID   Semaglutide   1 tablet Oral q morning   sodium chloride  flush  3 mL Intravenous Q12H   venlafaxine  XR  150 mg Oral Q breakfast   venlafaxine  XR  37.5 mg Oral QHS   Continuous Infusions:  sodium chloride      PRN Meds: sodium chloride , acetaminophen , furosemide , sodium chloride  flush   Vital Signs    Vitals:   10/29/23 1745 10/29/23 2049 10/29/23 2326 10/30/23 0405  BP: 127/70 (!) 126/98 131/63 110/83  Pulse: 89 82 90 77  Resp: 18 19 18 20   Temp: 98.1 F (36.7 C) 98.3 F (36.8 C) 98.3 F (36.8 C) 98.6 F (37 C)  TempSrc: Oral Oral Oral Oral  SpO2: 95%     Weight: 95.6 kg     Height: 5' 5 (1.651 m)       Intake/Output Summary (Last 24 hours) at 10/30/2023 0703 Last data filed at 10/29/2023 2300 Gross per 24 hour  Intake 480 ml  Output --  Net 480 ml   Filed Weights   10/29/23 1745  Weight: 95.6 kg    Physical Exam    GEN- NAD, A&O x 3. Normal affect.   Lungs- CTAB, Normal effort.  Heart- Regular rate and rhythm. No M/G/R GI- Soft, NT, ND Extremities- No clubbing, cyanosis, or edema Skin- no rash or lesion  Labs    CBC No results for input(s): WBC, NEUTROABS, HGB, HCT, MCV, PLT in the last 72 hours. Basic Metabolic Panel Recent Labs    92/84/74 1000 10/30/23 0405  NA 135 138  K 4.5 3.9  CL 99 101  CO2 24 25  GLUCOSE 168* 192*  BUN 11 10  CREATININE 0.56* 0.64  CALCIUM  9.9 10.2  MG 1.9 2.1    Telemetry    AF 80-120's (personally reviewed)  Patient Profile     Lisa Gutierrez is a 73 y.o. female with a past medical history significant for HTN, persistent atrial fibrillation s/p PVI/posterior wall ablation 05/02/23. They were admitted for tikosyn  load.   Assessment & Plan    Persistent Atrial Fibrillation Pt remains in afib on Tikosyn  500 mcg BID  Continue Eliquis  Creatinine, ser  0.64 (07/16 0405) Magnesium   2.1 (07/16 0405) Potassium3.9 (07/16 0405) Supplement K  If pt does not convert chemically, plan on DCCV Thursday    For questions or updates, please contact [facility] Please consult [email or web address] for contact info under Cardiology/STEMI.  Signed, Daphne Barrack, NP-C, AGACNP-BC [facility] - Electrophysiology  10/30/2023, 7:06 AM

## 2023-10-30 NOTE — Progress Notes (Signed)
 Morning EKG reviewed     Shows pt remains in afib with stable QTc at 424 ms.  Continue  Tikosyn  500 mcg BID.   Potassium3.9 (07/16 0405) Magnesium   2.1 (07/16 0405) Creatinine, ser  0.64 (07/16 0405)  Pt will be NPO after midnight for DCCV if remains in afib. Orders placed.   Daphne Barrack, NP-C, AGACNP-BC [facility] - Electrophysiology  10/30/2023, 12:26 PM

## 2023-10-31 ENCOUNTER — Inpatient Hospital Stay (HOSPITAL_COMMUNITY): Admitting: Certified Registered Nurse Anesthetist

## 2023-10-31 ENCOUNTER — Encounter (HOSPITAL_COMMUNITY): Payer: Self-pay | Admitting: Cardiology

## 2023-10-31 ENCOUNTER — Encounter (HOSPITAL_COMMUNITY)
Admission: AD | Disposition: A | Discharge status: Home / Self Care | Payer: Self-pay | Source: Home / Self Care | Attending: Cardiology

## 2023-10-31 DIAGNOSIS — I4891 Unspecified atrial fibrillation: Secondary | ICD-10-CM | POA: Diagnosis not present

## 2023-10-31 DIAGNOSIS — I10 Essential (primary) hypertension: Secondary | ICD-10-CM

## 2023-10-31 DIAGNOSIS — Z87891 Personal history of nicotine dependence: Secondary | ICD-10-CM

## 2023-10-31 DIAGNOSIS — F418 Other specified anxiety disorders: Secondary | ICD-10-CM

## 2023-10-31 HISTORY — PX: CARDIOVERSION: EP1203

## 2023-10-31 LAB — BASIC METABOLIC PANEL WITH GFR
Anion gap: 12 (ref 5–15)
BUN: 10 mg/dL (ref 8–23)
CO2: 24 mmol/L (ref 22–32)
Calcium: 10.2 mg/dL (ref 8.9–10.3)
Chloride: 101 mmol/L (ref 98–111)
Creatinine, Ser: 0.63 mg/dL (ref 0.44–1.00)
GFR, Estimated: >60 mL/min (ref >60)
Glucose, Bld: 173 mg/dL — ABNORMAL HIGH (ref 70–99)
Potassium: 4 mmol/L (ref 3.5–5.1)
Sodium: 137 mmol/L (ref 135–145)

## 2023-10-31 LAB — GLUCOSE, CAPILLARY
Glucose-Capillary: 215 mg/dL — ABNORMAL HIGH (ref 70–99)
Glucose-Capillary: 155 mg/dL — ABNORMAL HIGH (ref 70–99)
Glucose-Capillary: 182 mg/dL — ABNORMAL HIGH (ref 70–99)

## 2023-10-31 LAB — MAGNESIUM: Magnesium: 1.8 mg/dL (ref 1.7–2.4)

## 2023-10-31 SURGERY — CARDIOVERSION (CATH LAB)
Anesthesia: General

## 2023-10-31 MED ORDER — SODIUM CHLORIDE 0.9 % IV SOLN: INTRAVENOUS | Status: DC | PRN

## 2023-10-31 MED ORDER — MAGNESIUM SULFATE 2 GM/50ML IV SOLN
2.0000 g | Freq: Once | INTRAVENOUS | Status: AC
  Administered 2023-10-31: 2 g via INTRAVENOUS
  Filled 2023-10-31: qty 50

## 2023-10-31 MED ORDER — BISACODYL 5 MG PO TBEC
5.0000 mg | DELAYED_RELEASE_TABLET | Freq: Once | ORAL | Status: AC
  Administered 2023-10-31: 5 mg via ORAL
  Filled 2023-10-31 (×2): qty 1

## 2023-10-31 MED ORDER — PROPOFOL 10 MG/ML IV BOLUS
INTRAVENOUS | Status: DC | PRN
  Administered 2023-10-31: 60 mg via INTRAVENOUS

## 2023-10-31 MED ORDER — BISACODYL 5 MG PO TBEC: 5.0000 mg | DELAYED_RELEASE_TABLET | Freq: Every day | ORAL | Status: DC | PRN

## 2023-10-31 MED ORDER — LIDOCAINE 2% (20 MG/ML) 5 ML SYRINGE
INTRAMUSCULAR | Status: DC | PRN
  Administered 2023-10-31: 60 mg via INTRAVENOUS

## 2023-10-31 SURGICAL SUPPLY — 1 items: PAD DEFIB RADIO PHYSIO CONN (PAD) ×1 IMPLANT

## 2023-10-31 NOTE — Plan of Care (Signed)
   Problem: Education: Goal: Knowledge of General Education information will improve Description Including pain rating scale, medication(s)/side effects and non-pharmacologic comfort measures Outcome: Progressing

## 2023-10-31 NOTE — Anesthesia Preprocedure Evaluation (Addendum)
 Anesthesia Evaluation  Patient identified by MRN, date of birth, ID band Patient awake    Reviewed: Allergy & Precautions, NPO status , Patient's Chart, lab work & pertinent test results, reviewed documented beta blocker date and time   Airway Mallampati: III  TM Distance: >3 FB Neck ROM: Full    Dental  (+) Teeth Intact, Dental Advisory Given   Pulmonary former smoker   Pulmonary exam normal breath sounds clear to auscultation       Cardiovascular hypertension, Pt. on medications and Pt. on home beta blockers + dysrhythmias Atrial Fibrillation  Rhythm:Irregular Rate:Abnormal     Neuro/Psych  PSYCHIATRIC DISORDERS Anxiety Depression    negative neurological ROS     GI/Hepatic Neg liver ROS,GERD  Medicated,,  Endo/Other  diabetes, Type 2, Oral Hypoglycemic Agents  Obesity   Renal/GU negative Renal ROS     Musculoskeletal negative musculoskeletal ROS (+)    Abdominal   Peds  Hematology  (+) Blood dyscrasia (eliquis )   Anesthesia Other Findings Day of surgery medications reviewed with the patient.  Reproductive/Obstetrics                              Anesthesia Physical Anesthesia Plan  ASA: 3  Anesthesia Plan: General   Post-op Pain Management: Minimal or no pain anticipated   Induction: Intravenous  PONV Risk Score and Plan: 3 and TIVA  Airway Management Planned: Mask and Natural Airway  Additional Equipment:   Intra-op Plan:   Post-operative Plan:   Informed Consent: I have reviewed the patients History and Physical, chart, labs and discussed the procedure including the risks, benefits and alternatives for the proposed anesthesia with the patient or authorized representative who has indicated his/her understanding and acceptance.     Dental advisory given  Plan Discussed with: CRNA  Anesthesia Plan Comments:          Anesthesia Quick Evaluation

## 2023-10-31 NOTE — Progress Notes (Signed)
 Pharmacy: Dofetilide  (Tikosyn ) - Follow Up Assessment and Electrolyte Replacement  Pharmacy consulted to assist in monitoring and replacing electrolytes in this 73 y.o. female admitted on 10/29/2023 undergoing dofetilide  initiation. First dofetilide  dose: 7/15  Labs:    Component Value Date/Time   K 4.0 10/31/2023 0444   K 3.5 08/03/2013 1542   MG 1.8 10/31/2023 0444     Plan: Potassium: K >/= 4: No additional supplementation needed  Magnesium : Mg 1.8-2: Give Mg 2 gm IV x1    As patient has required 1 dose of 40 mEq of potassium replacement on 7/16, I do not recommend potassium supplementation at discharge.  Thank you for allowing pharmacy to participate in this patient's care   Amon Rocher, PharmD, RPh PGY-1 Pharmacy Resident [facility] 10/31/2023 10:38 AM

## 2023-10-31 NOTE — Progress Notes (Signed)
 Morning EKG reviewed     Shows is in NSR s/p DCC with stable QTc at 471 ms.  Continue  Tikosyn  500 mcg BID.   Potassium4.0 (07/17 0444) Magnesium   1.8 (07/17 0444) Creatinine, ser  0.63 (07/17 0444)  Plan for home Friday if QTc remains stable     Daphne Barrack, NP-C, AGACNP-BC [facility] - Electrophysiology  10/31/2023, 1:36 PM

## 2023-10-31 NOTE — Progress Notes (Signed)
 Electrophysiology Rounding Note  Patient Name: Lisa Gutierrez Date of Encounter: 10/31/2023  Primary Cardiologist: Deatrice Cage, MD  Electrophysiologist: [name], MD    Subjective   Pt remains in afib on Tikosyn  500 mcg BID   QTc from EKG last pm shows stable QTc at 461  The patient is doing well today.  At this time, the patient denies chest pain, shortness of breath, or any new concerns.  Inpatient Medications    Scheduled Meds:  ALPRAZolam   0.5 mg Oral QHS   apixaban   5 mg Oral BID   busPIRone   5 mg Oral BID   calcium -vitamin D   1 tablet Oral BID   cholecalciferol   2,000 Units Oral Daily   clobetasol  ointment   Topical BID   digoxin   250 mcg Oral Daily   docusate sodium   200 mg Oral QHS   dofetilide   500 mcg Oral BID   linaclotide   290 mcg Oral QAC breakfast   losartan   25 mg Oral Daily   metFORMIN   500 mg Oral BID WC   metoprolol  tartrate  100 mg Oral BID   multivitamin with minerals  1 tablet Oral Daily   mupirocin  ointment   Topical BID   pantoprazole   40 mg Oral BID   Semaglutide   1 tablet Oral q morning   sodium chloride  flush  3 mL Intravenous Q12H   venlafaxine  XR  150 mg Oral Q breakfast   venlafaxine  XR  37.5 mg Oral QHS   Continuous Infusions:  PRN Meds: acetaminophen , furosemide , sodium chloride  flush   Vital Signs    Vitals:   10/30/23 2026 10/30/23 2300 10/31/23 0022 10/31/23 0554  BP: 116/76 (!) 132/94 120/83 (!) 137/95  Pulse: 64  (!) 125 (!) 103  Resp: 18  18 15   Temp: 98.4 F (36.9 C)  98.3 F (36.8 C) 98.3 F (36.8 C)  TempSrc: Oral  Oral Oral  SpO2: 96%  95% 93%  Weight:      Height:        Intake/Output Summary (Last 24 hours) at 10/31/2023 9357 Last data filed at 10/30/2023 2026 Gross per 24 hour  Intake 243 ml  Output --  Net 243 ml   Filed Weights   10/29/23 1745  Weight: 95.6 kg    Physical Exam    GEN- NAD, A&O x 3. Normal affect.  Lungs- CTAB, Normal effort.  Heart- Irregularly irregular  rate and rhythm. No M/G/R GI- Soft, NT, ND Extremities- No clubbing, cyanosis, or edema Skin- no rash or lesion  Labs    CBC No results for input(s): WBC, NEUTROABS, HGB, HCT, MCV, PLT in the last 72 hours. Basic Metabolic Panel Recent Labs    92/83/74 0405 10/31/23 0444  NA 138 137  K 3.9 4.0  CL 101 101  CO2 25 24  GLUCOSE 192* 173*  BUN 10 10  CREATININE 0.64 0.63  CALCIUM  10.2 10.2  MG 2.1 1.8    Telemetry    AF 80-90's (personally reviewed)  Patient Profile     Lisa Gutierrez is a 73 y.o. female with a past medical history significant forHTN, persistent atrial fibrillation s/p PVI/posterior wall ablation 05/02/23.  They were admitted for tikosyn  load.   Assessment & Plan    Persistent Atrial Fibrillation Pt remains in afib on Tikosyn  500 mcg BID  Continue Eliquis  Creatinine, ser  0.63 (07/17 0444) Magnesium   1.8 (07/17 0444) Potassium4.0 (07/17 0444) Supplement Mg  If pt does not convert chemically, plan  on DCCV Thursday    For questions or updates, please contact [facility] Please consult [email or web address] for contact info under Cardiology/STEMI.  Signed, Daphne Barrack, NP-C, AGACNP-BC  [facility] - Elect[facility]iology  10/31/2023, 6:42 AM

## 2023-10-31 NOTE — Interval H&P Note (Signed)
 History and Physical Interval Note:  10/31/2023 8:50 AM  Lisa Gutierrez [name]  has presented today for surgery, with the diagnosis of afib.  The various methods of treatment have been discussed with the patient and family. After consideration of risks, benefits and other options for treatment, the patient has consented to  Procedure(s): CARDIOVERSION (N/A) as a surgical intervention.  The patient's history has been reviewed, patient examined, no change in status, stable for surgery.  I have reviewed the patient's chart and labs.  Questions were answered to the patient's satisfaction.     [name]

## 2023-10-31 NOTE — Transfer of Care (Signed)
 Immediate Anesthesia Transfer of Care Note  Patient: Lisa Gutierrez  Procedure(s) Performed: CARDIOVERSION  Patient Location: Cath Lab  Anesthesia Type:General  Level of Consciousness: drowsy and patient cooperative  Airway & Oxygen Therapy: Patient Spontanous Breathing and Patient connected to nasal cannula oxygen  Post-op Assessment: Report given to RN, Post -op Vital signs reviewed and stable, and Patient moving all extremities X 4  Post vital signs: Reviewed and stable  Last Vitals:  Vitals Value Taken Time  BP 104/88 10/31/23 08:54  Temp    Pulse 81 10/31/23 08:56  Resp 24 10/31/23 08:56  SpO2 95% 10/31/23 08:56    Last Pain:  Vitals:   10/31/23 0832  TempSrc: Temporal  PainSc:       Patients Stated Pain Goal: 0 (10/29/23 1745)  Complications: No notable events documented.

## 2023-10-31 NOTE — CV Procedure (Signed)
 Procedure:   DCCV  Indication:  Symptomatic atrial fibrillation  Procedure Note:  The patient signed informed consent.  They have had had therapeutic anticoagulation with apixaban  greater than 3 weeks.  Anesthesia was administered by Dr. Corinne.  Patient received 60 mg IV lidocaine  and 60 mg IV propofol .Adequate airway was maintained throughout and vital followed per protocol.  They were cardioverted x 1 with 200J of biphasic synchronized energy.  They converted to NSR.  There were no apparent complications.  The patient had normal neuro status and respiratory status post procedure with vitals stable as recorded elsewhere.    Follow up:  They will continue on current medical therapy and follow up with cardiology as scheduled.  Shelda Bruckner, MD PhD 10/31/2023 9:00 AM

## 2023-11-01 ENCOUNTER — Other Ambulatory Visit (HOSPITAL_COMMUNITY): Payer: Self-pay

## 2023-11-01 ENCOUNTER — Other Ambulatory Visit: Payer: Self-pay | Admitting: Family Medicine

## 2023-11-01 LAB — BASIC METABOLIC PANEL WITH GFR
Anion gap: 11 (ref 5–15)
BUN: 9 mg/dL (ref 8–23)
CO2: 25 mmol/L (ref 22–32)
Calcium: 9.6 mg/dL (ref 8.9–10.3)
Chloride: 101 mmol/L (ref 98–111)
Creatinine, Ser: 0.64 mg/dL (ref 0.44–1.00)
GFR, Estimated: >60 mL/min (ref >60)
Glucose, Bld: 176 mg/dL — ABNORMAL HIGH (ref 70–99)
Potassium: 3.9 mmol/L (ref 3.5–5.1)
Sodium: 137 mmol/L (ref 135–145)

## 2023-11-01 LAB — GLUCOSE, CAPILLARY: Glucose-Capillary: 222 mg/dL — ABNORMAL HIGH (ref 70–99)

## 2023-11-01 LAB — MAGNESIUM: Magnesium: 2 mg/dL (ref 1.7–2.4)

## 2023-11-01 MED ORDER — POTASSIUM CHLORIDE CRYS ER 20 MEQ PO TBCR
40.0000 meq | EXTENDED_RELEASE_TABLET | Freq: Once | ORAL | Status: AC
  Administered 2023-11-01: 40 meq via ORAL
  Filled 2023-11-01: qty 2

## 2023-11-01 MED ORDER — MAGNESIUM OXIDE 400 MG PO TABS
400.0000 mg | ORAL_TABLET | Freq: Every day | ORAL | 6 refills | Status: DC
  Filled 2023-11-01 – 2023-12-06 (×3): qty 30, 30d supply, fill #0

## 2023-11-01 MED ORDER — POTASSIUM CHLORIDE CRYS ER 20 MEQ PO TBCR
20.0000 meq | EXTENDED_RELEASE_TABLET | Freq: Every day | ORAL | 6 refills | Status: DC
  Filled 2023-11-01 – 2023-12-06 (×3): qty 30, 30d supply, fill #0

## 2023-11-01 MED ORDER — DOFETILIDE 500 MCG PO CAPS
500.0000 ug | ORAL_CAPSULE | Freq: Two times a day (BID) | ORAL | 6 refills | Status: DC
  Filled 2023-11-01: qty 60, 30d supply, fill #0

## 2023-11-01 NOTE — Telephone Encounter (Signed)
 Refill sent in

## 2023-11-01 NOTE — Progress Notes (Signed)
 Pharmacy: Dofetilide  (Tikosyn ) - Follow Up Assessment and Electrolyte Replacement  Pharmacy consulted to assist in monitoring and replacing electrolytes in this 73 y.o. female admitted on 10/29/2023 undergoing dofetilide  initiation. First dofetilide  dose: 7/15  Labs:    Component Value Date/Time   K 3.9 11/01/2023 0317   K 3.5 08/03/2013 1542   MG 2.0 11/01/2023 0317     Plan: Potassium: K 3.8-3.9:  Give KCl 40 mEq po x1   Magnesium : Mg > 2: No additional supplementation needed   Thank you for allowing pharmacy to participate in this patient's care   Lisa Gutierrez 11/01/2023  7:14 AM

## 2023-11-01 NOTE — Discharge Instructions (Signed)
 Dofetilide  Capsules What is this medication? DOFETILIDE  (doe FET il ide) treats a fast or irregular heartbeat (arrhythmia). It works by slowing down overactive electric signals in the heart, which stabilizes your heart rhythm. It belongs to a group of medications called antiarrhythmics. This medicine may be used for other purposes; ask your health care provider or pharmacist if you have questions. COMMON BRAND NAME(S): Tikosyn  What should I tell my care team before I take this medication? They need to know if you have any of these conditions: Heart disease History of irregular heartbeat History of low levels of potassium or magnesium  in the blood Kidney disease Liver disease An unusual or allergic reaction to dofetilide , other medications, foods, dyes, or preservatives Pregnant or trying to get pregnant Breast-feeding How should I use this medication? Take this medication by mouth with a glass of water. Follow the directions on the prescription label. Do not take with grapefruit juice. You can take it with or without food. If it upsets your stomach, take it with food. Take your medication at regular intervals. Do not take it more often than directed. Do not stop taking except on your care team's advice. A special MedGuide will be given to you by the pharmacist with each prescription and refill. Be sure to read this information carefully each time. Talk to your care team about the use of this medication in children. Special care may be needed. Overdosage: If you think you have taken too much of this medicine contact a poison control center or emergency room at once. NOTE: This medicine is only for you. Do not share this medicine with others. What if I miss a dose? If you miss a dose, skip it. Take your next dose at the normal time. Do not take extra or 2 doses at the same time to make up for the missed dose. What may interact with this medication? Do not take this medication with any of  the following: Benadryl (Diphenhydramine) Cimetidine (Tagamet) Cisapride Dolutegravir Dronedarone Erdafitinib Hydrochlorothiazide Immodium Ketoconazole Megestrol Pimozide Prochlorperazine Thioridazine Trimethoprim Verapamil This medication may also interact with the following: Amiloride Cannabinoids Certain antibiotics like erythromycin or clarithromycin  Certain antiviral medications for HIV or hepatitis Certain medications for depression, anxiety, or psychotic disorders Digoxin  Diltiazem  Grapefruit juice Metformin  Nefazodone Other medications that prolong the QT interval (an abnormal heart rhythm) Quinine Triamterene Zafirlukast Ziprasidone This list may not describe all possible interactions. Give your health care provider a list of all the medicines, herbs, non-prescription drugs, or dietary supplements you use. Also tell them if you smoke, drink alcohol, or use illegal drugs. Some items may interact with your medicine. What should I watch for while using this medication? Your condition will be monitored carefully while you are receiving this medication. What side effects may I notice from receiving this medication? Side effects that you should report to your care team as soon as possible: Allergic reactions--skin rash, itching, hives, swelling of the face, lips, tongue, or throat Chest pain Heart rhythm changes--fast or irregular heartbeat, dizziness, feeling faint or lightheaded, chest pain, trouble breathing Side effects that usually do not require medical attention (report to your care team if they continue or are bothersome): Dizziness Headache Nausea Stomach pain Trouble sleeping This list may not describe all possible side effects. Call your doctor for medical advice about side effects. You may report side effects to FDA at 1-800-FDA-1088. Where should I keep my medication? Keep out of the reach of children. Store at room temperature between 15  and 30  degrees C (59 and 86 degrees F). Throw away any unused medication after the expiration date. NOTE: This sheet is a summary. It may not cover all possible information. If you have questions about this medicine, talk to your doctor, pharmacist, or health care provider.  2024 Elsevier/Gold Standard (2021-03-03 00:00:00)

## 2023-11-01 NOTE — Progress Notes (Signed)
 Briefly spoke with patient yesterday and again today for returned calls. Advised I have still not heard anything about the Xanax  refill. Advised she has an appt on 7/24 with [place]- note added to discuss refill at this visit.   Also advised Lang Sieve is the pharmacist with the office now. I will send him a message on Monday to follow-up with her if needed. For Xanax  currently, advised to request a refill from the hospital to hold her over until follow-up visit on 6/24 as she states she will be  a nervous wreck until then.

## 2023-11-01 NOTE — Discharge Summary (Signed)
 ELECTROPHYSIOLOGY DISCHARGE SUMMARY    Patient ID: Lisa Gutierrez,  MRN: [id number], DOB/AGE: 08-26-1950 73 y.o.  Admit date: 10/29/2023 Discharge date: 11/01/2023  Primary Care Physician: Wallace Joesph LABOR, PA  Primary Cardiologist: Deatrice Cage, MD  Electrophysiologist: Dr. Cindie   Primary Discharge Diagnosis:  1.  Persistent atrial fibrillation status post Tikosyn  loading this admission  Secondary Discharge Diagnosis:  Hypokalemia  Hypomagnesemia  HTN   Allergies  Allergen Reactions   Aspirin Other (See Comments)    GI upset   Ibuprofen Other (See Comments)    GERD   Levofloxacin Other (See Comments)    Weight gain   Meloxicam Other (See Comments)    GI upset   Morphine Other (See Comments)   Naprosyn  [Naproxen] Other (See Comments)    GI upset   Nsaids Other (See Comments)    GI upset   Ozempic  (0.25 Or 0.5 Mg-Dose) [Semaglutide (0.25 Or 0.5mg -Dos)] Nausea And Vomiting   Propofol  Other (See Comments)    Had horrible nightmares   Quinolones    Robinul  [Glycopyrrolate] Nausea And Vomiting   Penicillin V Potassium Rash   Sulfa Antibiotics Rash     Procedures This Admission:  1.  Tikosyn  loading  2.  Direct current cardioversion on Thursday October 31, 2023 by Dr. Lonni which successfully restored SR.  There were no early apparent complications.   Brief HPI: Lisa Gutierrez is a 73 y.o. female with a past medical history as noted above.  They were referred to EP for treatment options of atrial fibrillation.  Risks, benefits, and alternatives to Tikosyn  were reviewed with the patient who wished to proceed with admission for loading.  Hospital Course:  The patient was admitted and Tikosyn  was initiated.  Renal function and electrolytes were followed during the hospitalization.  Their QTc remained stable. On July 17th, 2025  they underwent direct current cardioversion which restored sinus rhythm. The patients QTc remained stable. She  required K+ & Mg+ supplementation. They were monitored on telemetry up to discharge. On the day of discharge, they were examined by Dr. Cindie who considered them stable for discharge to home.  Follow-up has been arranged with the Atrial Fibrillation clinic in approximately 1 week.    Follow up BMP, may ned to adjust K+/Mg+ supplementation pending labs.   Physical Exam: Vitals:   10/31/23 2038 11/01/23 0100 11/01/23 0554 11/01/23 0805  BP: 115/76 109/73 119/78 115/62  Pulse: 73 68 63 67  Resp:  18 17 16   Temp:  97.8 F (36.6 C) 98.4 F (36.9 C) 98.6 F (37 C)  TempSrc:  Oral Oral Oral  SpO2:  94% 93% 95%  Weight:      Height:        GEN- pleasant elderly female in NAD, A&O x 3. Normal affect.  Lungs- CTAB, Normal effort.  Heart- Regular rate and rhythm. No M/G/R GI- Soft, NT, ND Extremities- No clubbing, cyanosis, or edema Skin- no rash or lesion.  Slightly pink area over anterior check from DCCV  Labs:   Lab Results  Component Value Date   WBC 9.0 09/10/2023   HGB 14.3 09/10/2023   HCT 44.6 09/10/2023   MCV 92 09/10/2023   PLT 242 09/10/2023    Recent Labs  Lab 11/01/23 0317  NA 137  K 3.9  CL 101  CO2 25  BUN 9  CREATININE 0.64  CALCIUM  9.6  GLUCOSE 176*    Discharge Medications:  Allergies as of 11/01/2023  Reactions   Aspirin Other (See Comments)   GI upset   Ibuprofen Other (See Comments)   GERD   Levofloxacin Other (See Comments)   Weight gain   Meloxicam Other (See Comments)   GI upset   Morphine Other (See Comments)   Naprosyn  [naproxen] Other (See Comments)   GI upset   Nsaids Other (See Comments)   GI upset   Ozempic  (0.25 Or 0.5 Mg-dose) [semaglutide (0.25 Or 0.5mg -dos)] Nausea And Vomiting   Propofol  Other (See Comments)   Had horrible nightmares   Quinolones    Robinul  [glycopyrrolate] Nausea And Vomiting   Penicillin V Potassium Rash   Sulfa Antibiotics Rash        Medication List     TAKE these medications     acetaminophen  500 MG tablet Commonly known as: TYLENOL  Take 1,000 mg by mouth every 8 (eight) hours as needed for mild pain (pain score 1-3), moderate pain (pain score 4-6) or headache.   ALPRAZolam  0.5 MG tablet Commonly known as: XANAX  Take 1 tablet (0.5 mg total) by mouth at bedtime as needed. What changed: reasons to take this   apixaban  5 MG Tabs tablet Commonly known as: Eliquis  Take 1 tablet (5 mg total) by mouth 2 (two) times daily.   busPIRone  5 MG tablet Commonly known as: BUSPAR  Take 5 mg by mouth 2 (two) times daily.   Calcium  600/Vitamin D3 600-20 MG-MCG Tabs Generic drug: Calcium  Carb-Cholecalciferol  Take 1 tablet by mouth 2 (two) times daily.   clobetasol  ointment 0.05 % Commonly known as: TEMOVATE  Apply topically 2 (two) times daily. Apply topically to affected twice daily until improved. What changed:  how much to take when to take this reasons to take this additional instructions   digoxin  0.25 MG tablet Commonly known as: LANOXIN  TAKE 1 TABLET BY MOUTH EVERY DAY   dofetilide  500 MCG capsule Commonly known as: TIKOSYN  Take 1 capsule (500 mcg total) by mouth 2 (two) times daily.   empagliflozin  25 MG Tabs tablet Commonly known as: JARDIANCE  Take 1 tablet (25 mg total) by mouth daily before breakfast.   Flaxseed Oil 1400 MG Caps Take 1,400 mg by mouth 2 (two) times daily.   furosemide  20 MG tablet Commonly known as: LASIX  TAKE 1 TABLET BY MOUTH EVERY DAY What changed:  when to take this reasons to take this   glucose blood test strip Use as instructed   BLOOD GLUCOSE TEST STRIPS Strp 1 each by In Vitro route in the morning, at noon, and at bedtime. May substitute to any manufacturer covered by patient's insurance.   linaclotide  290 MCG Caps capsule Commonly known as: LINZESS  Take 1 capsule (290 mcg total) by mouth daily before breakfast.   losartan  25 MG tablet Commonly known as: COZAAR  Take 1 tablet (25 mg total) by mouth daily.    magnesium  oxide 400 MG tablet Commonly known as: MAG-OX Take 1 tablet (400 mg total) by mouth daily.   metFORMIN  500 MG 24 hr tablet Commonly known as: GLUCOPHAGE -XR TAKE 1 TABLET BY MOUTH TWICE A DAY WITH FOOD   metoprolol  tartrate 100 MG tablet Commonly known as: LOPRESSOR  Take 1 tablet (100 mg total) by mouth 2 (two) times daily.   MULTIVITAMIN ADULT PO Take 1 tablet by mouth daily. One a day   ONE TOUCH ULTRA 2 w/Device Kit CHECK IN THE MORNING, AT NOON, AND AT BEDTIME. MAY SUBSTITUTE TO ANY MANUFACTURER COVERED BY PATIENT'S INSURANCE.   pantoprazole  40 MG tablet Commonly known as: PROTONIX   TAKE 1 TABLET BY MOUTH TWICE A DAY   potassium chloride  SA 20 MEQ tablet Commonly known as: KLOR-CON  M Take 1 tablet (20 mEq total) by mouth daily.   Rybelsus  14 MG Tabs Generic drug: Semaglutide  Take 1 tablet by mouth every morning.   STOOL SOFTENER PO Take 2 tablets by mouth at bedtime.   venlafaxine  XR 150 MG 24 hr capsule Commonly known as: EFFEXOR -XR Take 1 capsule (150 mg total) by mouth daily with breakfast. Take total of 187.5 mg daily. Take along with 37.5 mg cap Start taking on: November 02, 2023   venlafaxine  XR 37.5 MG 24 hr capsule Commonly known as: EFFEXOR -XR Take 1 capsule (37.5 mg total) by mouth daily with breakfast. Take total of 187.5 mg daily. Take along with 150 mg cap Start taking on: November 02, 2023   Vitamin D3 50 MCG (2000 UT) capsule Take 2,000 Units by mouth daily.        Disposition:  Home with follow up in AF clinic in 1 week as in AVS.   Duration of Discharge Encounter:  APP time: 36 minutes   Signed, Daphne Barrack, NP-C, AGACNP-BC [facility] - Electrophysiology  11/01/2023, 11:05 AM

## 2023-11-01 NOTE — Progress Notes (Signed)
 EKG from yesterday evening 10/31/2023 reviewed     Shows is in NSR s/p DCC with stable QTc at 464 ms.  Continue  Tikosyn  500 mcg BID.   Potassium3.9 (07/18 0317) Magnesium   2.0 (07/18 0317) Creatinine, ser  0.64 (07/18 0317)  KCL replacement   Plan for home Friday if QTc remains stable    Daphne Barrack, NP-C, AGACNP-BC [facility] - Electrophysiology  11/01/2023, 6:56 AM

## 2023-11-01 NOTE — Progress Notes (Signed)
Discharge instructions provided to patient. All medications, follow up appointments, and discharge instructions discussed. IV out. Monitor off CCMD notified. Discharging home 

## 2023-11-02 ENCOUNTER — Encounter: Payer: Self-pay | Admitting: Cardiology

## 2023-11-02 NOTE — Anesthesia Postprocedure Evaluation (Signed)
 Anesthesia Post Note  Patient: Lisa Gutierrez  Procedure(s) Performed: CARDIOVERSION     Patient location during evaluation: PACU Anesthesia Type: General Level of consciousness: awake and alert Pain management: pain level controlled Vital Signs Assessment: post-procedure vital signs reviewed and stable Respiratory status: spontaneous breathing, nonlabored ventilation, respiratory function stable and patient connected to nasal cannula oxygen Cardiovascular status: blood pressure returned to baseline and stable Postop Assessment: no apparent nausea or vomiting Anesthetic complications: no   No notable events documented.  Last Vitals:    Last Pain:   Pain Goal: Patients Stated Pain Goal: 0 (10/31/23 1312)                 Garnette FORBES Skillern

## 2023-11-04 ENCOUNTER — Other Ambulatory Visit: Payer: Self-pay | Admitting: Pharmacist

## 2023-11-04 ENCOUNTER — Telehealth: Payer: Self-pay

## 2023-11-04 NOTE — Telephone Encounter (Signed)
 Copied from CRM [id number]. Topic: Appointments - Appointment Info/Confirmation >> Nov 04, 2023 10:42 AM [name] wrote: Patient/patient representative is calling for information regarding an appointment.  Patient was returning your call please follow up with patient   Pharmacist called patient Lisa Gutierrez

## 2023-11-04 NOTE — Progress Notes (Signed)
   11/04/2023  Patient ID: Lisa Gutierrez [name], female   DOB: 09/18/1950, 73 y.o.   MRN: [id number]  Attempted to reach patient - I plan to review medication-related concerns with her Monday 11/11/2023 and have set up a telephone visit 10am. Has upcoming visits 11/08/2023, I am on PAL 7/23-7/25. Should any urgent needs arise during that time, there will be a covering pharmacist to address.   Lang Sieve, PharmD, BCGP

## 2023-11-04 NOTE — Progress Notes (Addendum)
 Attempted patient once more regarding returned call. No answer. Patient can reach back out to me via mychart if that is preferred to her. Can transfer to me or provide number below if calling back.   Lang Sieve, PharmD, BCGP Clinical Pharmacist  [phone]

## 2023-11-05 ENCOUNTER — Inpatient Hospital Stay
Admission: EM | Admit: 2023-11-05 | Discharge: 2023-11-07 | DRG: 309 | Disposition: A | Discharge status: Home / Self Care | Attending: Internal Medicine | Admitting: Internal Medicine

## 2023-11-05 ENCOUNTER — Emergency Department

## 2023-11-05 ENCOUNTER — Other Ambulatory Visit: Payer: Self-pay

## 2023-11-05 ENCOUNTER — Encounter: Payer: Self-pay | Admitting: Emergency Medicine

## 2023-11-05 DIAGNOSIS — Z881 Allergy status to other antibiotic agents status: Secondary | ICD-10-CM | POA: Diagnosis not present

## 2023-11-05 DIAGNOSIS — E66812 Obesity, class 2: Secondary | ICD-10-CM | POA: Diagnosis present

## 2023-11-05 DIAGNOSIS — I4891 Unspecified atrial fibrillation: Secondary | ICD-10-CM | POA: Diagnosis present

## 2023-11-05 DIAGNOSIS — R7402 Elevation of levels of lactic acid dehydrogenase (LDH): Secondary | ICD-10-CM | POA: Diagnosis present

## 2023-11-05 DIAGNOSIS — Z886 Allergy status to analgesic agent status: Secondary | ICD-10-CM | POA: Diagnosis not present

## 2023-11-05 DIAGNOSIS — E1169 Type 2 diabetes mellitus with other specified complication: Secondary | ICD-10-CM | POA: Diagnosis present

## 2023-11-05 DIAGNOSIS — Z880 Allergy status to penicillin: Secondary | ICD-10-CM | POA: Diagnosis not present

## 2023-11-05 DIAGNOSIS — E872 Acidosis, unspecified: Secondary | ICD-10-CM | POA: Diagnosis not present

## 2023-11-05 DIAGNOSIS — A419 Sepsis, unspecified organism: Secondary | ICD-10-CM | POA: Diagnosis not present

## 2023-11-05 DIAGNOSIS — Z7901 Long term (current) use of anticoagulants: Secondary | ICD-10-CM

## 2023-11-05 DIAGNOSIS — I4819 Other persistent atrial fibrillation: Principal | ICD-10-CM | POA: Diagnosis present

## 2023-11-05 DIAGNOSIS — J069 Acute upper respiratory infection, unspecified: Secondary | ICD-10-CM | POA: Diagnosis present

## 2023-11-05 DIAGNOSIS — R6510 Systemic inflammatory response syndrome (SIRS) of non-infectious origin without acute organ dysfunction: Secondary | ICD-10-CM

## 2023-11-05 DIAGNOSIS — F3341 Major depressive disorder, recurrent, in partial remission: Secondary | ICD-10-CM | POA: Diagnosis present

## 2023-11-05 DIAGNOSIS — E785 Hyperlipidemia, unspecified: Secondary | ICD-10-CM | POA: Diagnosis present

## 2023-11-05 DIAGNOSIS — K219 Gastro-esophageal reflux disease without esophagitis: Secondary | ICD-10-CM | POA: Diagnosis not present

## 2023-11-05 DIAGNOSIS — Z794 Long term (current) use of insulin: Secondary | ICD-10-CM | POA: Diagnosis not present

## 2023-11-05 DIAGNOSIS — Z882 Allergy status to sulfonamides status: Secondary | ICD-10-CM | POA: Diagnosis not present

## 2023-11-05 DIAGNOSIS — Z885 Allergy status to narcotic agent status: Secondary | ICD-10-CM

## 2023-11-05 DIAGNOSIS — Z888 Allergy status to other drugs, medicaments and biological substances status: Secondary | ICD-10-CM

## 2023-11-05 DIAGNOSIS — I10 Essential (primary) hypertension: Secondary | ICD-10-CM | POA: Diagnosis present

## 2023-11-05 DIAGNOSIS — Z87891 Personal history of nicotine dependence: Secondary | ICD-10-CM

## 2023-11-05 DIAGNOSIS — Z8249 Family history of ischemic heart disease and other diseases of the circulatory system: Secondary | ICD-10-CM | POA: Diagnosis not present

## 2023-11-05 DIAGNOSIS — F411 Generalized anxiety disorder: Secondary | ICD-10-CM | POA: Diagnosis present

## 2023-11-05 DIAGNOSIS — E1165 Type 2 diabetes mellitus with hyperglycemia: Secondary | ICD-10-CM | POA: Diagnosis present

## 2023-11-05 DIAGNOSIS — Z6835 Body mass index (BMI) 35.0-35.9, adult: Secondary | ICD-10-CM

## 2023-11-05 DIAGNOSIS — Z789 Other specified health status: Secondary | ICD-10-CM | POA: Diagnosis present

## 2023-11-05 DIAGNOSIS — K760 Fatty (change of) liver, not elsewhere classified: Secondary | ICD-10-CM | POA: Diagnosis present

## 2023-11-05 DIAGNOSIS — Z83511 Family history of glaucoma: Secondary | ICD-10-CM

## 2023-11-05 DIAGNOSIS — Z79899 Other long term (current) drug therapy: Secondary | ICD-10-CM

## 2023-11-05 DIAGNOSIS — Z7984 Long term (current) use of oral hypoglycemic drugs: Secondary | ICD-10-CM

## 2023-11-05 DIAGNOSIS — Z833 Family history of diabetes mellitus: Secondary | ICD-10-CM

## 2023-11-05 DIAGNOSIS — Z0389 Encounter for observation for other suspected diseases and conditions ruled out: Secondary | ICD-10-CM | POA: Diagnosis not present

## 2023-11-05 DIAGNOSIS — I700 Atherosclerosis of aorta: Secondary | ICD-10-CM | POA: Diagnosis not present

## 2023-11-05 DIAGNOSIS — Z1152 Encounter for screening for COVID-19: Secondary | ICD-10-CM

## 2023-11-05 LAB — CBC WITH DIFFERENTIAL/PLATELET
Abs Immature Granulocytes: 0.1 K/uL — ABNORMAL HIGH (ref 0.00–0.07)
Basophils Absolute: 0.1 K/uL (ref 0.0–0.1)
Basophils Relative: 1 %
Eosinophils Absolute: 0.1 K/uL (ref 0.0–0.5)
Eosinophils Relative: 1 %
HCT: 42 % (ref 36.0–46.0)
Hemoglobin: 13.8 g/dL (ref 12.0–15.0)
Immature Granulocytes: 1 %
Lymphocytes Relative: 21 %
Lymphs Abs: 2 K/uL (ref 0.7–4.0)
MCH: 29.7 pg (ref 26.0–34.0)
MCHC: 32.9 g/dL (ref 30.0–36.0)
MCV: 90.3 fL (ref 80.0–100.0)
Monocytes Absolute: 1.1 K/uL — ABNORMAL HIGH (ref 0.1–1.0)
Monocytes Relative: 11 %
Neutro Abs: 6.2 K/uL (ref 1.7–7.7)
Neutrophils Relative %: 65 %
Platelets: 240 K/uL (ref 150–400)
RBC: 4.65 MIL/uL (ref 3.87–5.11)
RDW: 14.1 % (ref 11.5–15.5)
WBC: 9.5 K/uL (ref 4.0–10.5)
nRBC: 0 % (ref 0.0–0.2)

## 2023-11-05 LAB — COMPREHENSIVE METABOLIC PANEL WITH GFR
ALT: 33 U/L (ref 0–44)
AST: 45 U/L — ABNORMAL HIGH (ref 15–41)
Albumin: 3.6 g/dL (ref 3.5–5.0)
Alkaline Phosphatase: 54 U/L (ref 38–126)
Anion gap: 13 (ref 5–15)
BUN: 13 mg/dL (ref 8–23)
CO2: 21 mmol/L — ABNORMAL LOW (ref 22–32)
Calcium: 10.4 mg/dL — ABNORMAL HIGH (ref 8.9–10.3)
Chloride: 102 mmol/L (ref 98–111)
Creatinine, Ser: 0.56 mg/dL (ref 0.44–1.00)
GFR, Estimated: >60 mL/min (ref >60)
Glucose, Bld: 168 mg/dL — ABNORMAL HIGH (ref 70–99)
Potassium: 3.9 mmol/L (ref 3.5–5.1)
Sodium: 136 mmol/L (ref 135–145)
Total Bilirubin: 0.5 mg/dL (ref 0.0–1.2)
Total Protein: 7.6 g/dL (ref 6.5–8.1)

## 2023-11-05 LAB — RESP PANEL BY RT-PCR (RSV, FLU A&B, COVID)  RVPGX2
Influenza A by PCR: NEGATIVE
Influenza B by PCR: NEGATIVE
Resp Syncytial Virus by PCR: NEGATIVE
SARS Coronavirus 2 by RT PCR: NEGATIVE

## 2023-11-05 LAB — LACTIC ACID, PLASMA
Lactic Acid, Venous: 3.5 mmol/L (ref 0.5–1.9)
Lactic Acid, Venous: 3 mmol/L (ref 0.5–1.9)

## 2023-11-05 LAB — CBG MONITORING, ED: Glucose-Capillary: 176 mg/dL — ABNORMAL HIGH (ref 70–99)

## 2023-11-05 LAB — PROTIME-INR
INR: 1.1 (ref 0.8–1.2)
Prothrombin Time: 14.4 s (ref 11.4–15.2)

## 2023-11-05 LAB — TROPONIN I (HIGH SENSITIVITY): Troponin I (High Sensitivity): 8 ng/L (ref <18)

## 2023-11-05 LAB — DIGOXIN LEVEL: Digoxin Level: 0.4 ng/mL — ABNORMAL LOW (ref 0.8–2.0)

## 2023-11-05 LAB — MAGNESIUM: Magnesium: 2.1 mg/dL (ref 1.7–2.4)

## 2023-11-05 MED ORDER — ACETAMINOPHEN 650 MG RE SUPP: 650.0000 mg | Freq: Four times a day (QID) | RECTAL | Status: DC | PRN

## 2023-11-05 MED ORDER — HYDRALAZINE HCL 20 MG/ML IJ SOLN: 5.0000 mg | Freq: Four times a day (QID) | INTRAMUSCULAR | Status: DC | PRN

## 2023-11-05 MED ORDER — SODIUM CHLORIDE 0.9 % IV SOLN
2.0000 g | Freq: Once | INTRAVENOUS | Status: AC
  Administered 2023-11-05: 2 g via INTRAVENOUS
  Filled 2023-11-05: qty 20

## 2023-11-05 MED ORDER — LACTATED RINGERS IV BOLUS
1000.0000 mL | Freq: Once | INTRAVENOUS | Status: AC
  Administered 2023-11-05: 1000 mL via INTRAVENOUS

## 2023-11-05 MED ORDER — SODIUM CHLORIDE 0.9 % IV SOLN: 2.0000 g | INTRAVENOUS | Status: DC

## 2023-11-05 MED ORDER — APIXABAN 5 MG PO TABS
5.0000 mg | ORAL_TABLET | Freq: Two times a day (BID) | ORAL | Status: DC
Start: 2023-11-05 — End: 2023-11-07
  Administered 2023-11-06 – 2023-11-07 (×4): 5 mg via ORAL
  Filled 2023-11-05 (×4): qty 1

## 2023-11-05 MED ORDER — VENLAFAXINE HCL ER 37.5 MG PO CP24: 37.5000 mg | ORAL_CAPSULE | Freq: Every day | ORAL | Status: DC

## 2023-11-05 MED ORDER — DOCUSATE SODIUM 100 MG PO CAPS
100.0000 mg | ORAL_CAPSULE | Freq: Every day | ORAL | Status: DC
  Administered 2023-11-06 (×2): 100 mg via ORAL
  Filled 2023-11-05 (×2): qty 1

## 2023-11-05 MED ORDER — DIGOXIN 250 MCG PO TABS
250.0000 ug | ORAL_TABLET | Freq: Every day | ORAL | Status: DC
  Administered 2023-11-06 – 2023-11-07 (×2): 250 ug via ORAL
  Filled 2023-11-05 (×3): qty 1

## 2023-11-05 MED ORDER — DOFETILIDE 250 MCG PO CAPS
500.0000 ug | ORAL_CAPSULE | Freq: Two times a day (BID) | ORAL | Status: DC
  Administered 2023-11-06 – 2023-11-07 (×3): 500 ug via ORAL
  Filled 2023-11-05 (×5): qty 2

## 2023-11-05 MED ORDER — BUSPIRONE HCL 5 MG PO TABS
5.0000 mg | ORAL_TABLET | Freq: Two times a day (BID) | ORAL | Status: DC
  Administered 2023-11-06 – 2023-11-07 (×4): 5 mg via ORAL
  Filled 2023-11-05 (×4): qty 1

## 2023-11-05 MED ORDER — INSULIN ASPART 100 UNIT/ML IJ SOLN
0.0000 [IU] | Freq: Three times a day (TID) | INTRAMUSCULAR | Status: DC
  Administered 2023-11-06: 2 [IU] via SUBCUTANEOUS
  Administered 2023-11-06: 3 [IU] via SUBCUTANEOUS
  Administered 2023-11-06: 2 [IU] via SUBCUTANEOUS
  Administered 2023-11-07: 3 [IU] via SUBCUTANEOUS
  Administered 2023-11-07: 2 [IU] via SUBCUTANEOUS
  Filled 2023-11-05 (×5): qty 1

## 2023-11-05 MED ORDER — SODIUM CHLORIDE 0.9 % IV SOLN: 500.0000 mg | Freq: Once | INTRAVENOUS | Status: DC

## 2023-11-05 MED ORDER — ACETAMINOPHEN 325 MG PO TABS
650.0000 mg | ORAL_TABLET | Freq: Four times a day (QID) | ORAL | Status: DC | PRN
  Administered 2023-11-06 – 2023-11-07 (×3): 650 mg via ORAL
  Filled 2023-11-05 (×3): qty 2

## 2023-11-05 MED ORDER — LOSARTAN POTASSIUM 50 MG PO TABS
25.0000 mg | ORAL_TABLET | Freq: Every day | ORAL | Status: DC
  Administered 2023-11-06 – 2023-11-07 (×2): 25 mg via ORAL
  Filled 2023-11-05 (×2): qty 1

## 2023-11-05 MED ORDER — INSULIN ASPART 100 UNIT/ML IJ SOLN
0.0000 [IU] | Freq: Every day | INTRAMUSCULAR | Status: DC
  Administered 2023-11-06: 2 [IU] via SUBCUTANEOUS
  Filled 2023-11-05: qty 1

## 2023-11-05 MED ORDER — ALPRAZOLAM 0.5 MG PO TABS: 0.5000 mg | ORAL_TABLET | Freq: Every evening | ORAL | Status: DC | PRN

## 2023-11-05 MED ORDER — VENLAFAXINE HCL ER 150 MG PO CP24: 150.0000 mg | ORAL_CAPSULE | Freq: Every day | ORAL | Status: DC

## 2023-11-05 MED ORDER — VENLAFAXINE HCL ER 37.5 MG PO CP24
187.5000 mg | ORAL_CAPSULE | Freq: Every day | ORAL | Status: DC
  Administered 2023-11-06 – 2023-11-07 (×2): 187.5 mg via ORAL
  Filled 2023-11-05 (×3): qty 1

## 2023-11-05 MED ORDER — DOFETILIDE 250 MCG PO CAPS
500.0000 ug | ORAL_CAPSULE | Freq: Once | ORAL | Status: AC
  Administered 2023-11-05: 500 ug via ORAL
  Filled 2023-11-05: qty 2

## 2023-11-05 MED ORDER — PANTOPRAZOLE SODIUM 40 MG PO TBEC
40.0000 mg | DELAYED_RELEASE_TABLET | Freq: Two times a day (BID) | ORAL | Status: DC
  Administered 2023-11-06 – 2023-11-07 (×4): 40 mg via ORAL
  Filled 2023-11-05 (×4): qty 1

## 2023-11-05 MED ORDER — SODIUM CHLORIDE 0.9 % IV SOLN: INTRAVENOUS | Status: AC

## 2023-11-05 MED ORDER — ONDANSETRON HCL 4 MG PO TABS: 4.0000 mg | ORAL_TABLET | Freq: Four times a day (QID) | ORAL | Status: DC | PRN

## 2023-11-05 MED ORDER — SODIUM CHLORIDE 0.9 % IV SOLN
500.0000 mg | INTRAVENOUS | Status: DC
  Administered 2023-11-06: 500 mg via INTRAVENOUS
  Filled 2023-11-05: qty 5

## 2023-11-05 MED ORDER — ONDANSETRON HCL 4 MG/2ML IJ SOLN: 4.0000 mg | Freq: Four times a day (QID) | INTRAMUSCULAR | Status: DC | PRN

## 2023-11-05 MED ORDER — METOPROLOL TARTRATE 25 MG PO TABS
100.0000 mg | ORAL_TABLET | Freq: Two times a day (BID) | ORAL | Status: DC
  Administered 2023-11-06 – 2023-11-07 (×4): 100 mg via ORAL
  Filled 2023-11-05 (×4): qty 4

## 2023-11-05 MED ORDER — ALPRAZOLAM 0.5 MG PO TABS
0.5000 mg | ORAL_TABLET | Freq: Every evening | ORAL | Status: DC | PRN
  Administered 2023-11-06 (×2): 0.5 mg via ORAL
  Filled 2023-11-05 (×2): qty 1

## 2023-11-05 NOTE — Assessment & Plan Note (Addendum)
 PDMP reviewed Home buspirone  5 mg p.o. twice daily, venlafaxine  150 and 37.5 mg daily resumed Home alprazolam  0.5 mg nightly as needed for sleep

## 2023-11-05 NOTE — Assessment & Plan Note (Signed)
 workup in progress, differentials include infectious etiology Blood cultures x 2 are in process, UA has been ordered and pending collection at this time Patient is status post ceftriaxone  2 g IV one-time dose Admitted to telemetry cardiac, inpatient

## 2023-11-05 NOTE — Hospital Course (Signed)
 [name] is a 73 year old female with history of GERD, IBS, fatty liver, chronic gastritis, insomnia, atrial fibrillation status post DCCV x 2 and Tikosyn  on 10/29/2023, hypertension, morbid obesity, who presents emergency department for chief concerns of dizziness, and falling today. She is found to have atrial fibrillation with RVR.  Cardiology consult obtained. Patient is seen by cardiology, restarted Tikosyn , beta-blocker as well as Eliquis . Patient atrial fibrillation was triggered by recent upper resp infection.  Cardiology follow-up as outpatient for possible cardioversion; cleared patient for discharge.  Cardiology will arrange.

## 2023-11-05 NOTE — Assessment & Plan Note (Signed)
 Home PPI resumed

## 2023-11-05 NOTE — Assessment & Plan Note (Signed)
 Blood cultures x 2 are in process

## 2023-11-05 NOTE — ED Triage Notes (Addendum)
 Pt recently hospitalized for cardiac conversion and ablation. While in hospital pt diagnosed with RSV and Influenza. Pt has had fever, weakness and tachycardia since Saturday 11/02/23. Tylenol  taken 1 hour prior to arrival to ED. Pt c/o dizziness and a fall today without LOC, hitting her nose. Pt recently started on Tickosyn and has not had her dose tonight.

## 2023-11-05 NOTE — ED Provider Notes (Signed)
 [facility] Provider Note    Event Date/Time   First MD Initiated Contact with Patient 11/05/23 1950     (approximate)   History   Chief Complaint Fever and Post-op Problem   HPI  Lisa Gutierrez is a 73 y.o. female with past medical history of hypertension, hyperlipidemia, diabetes, and atrial fibrillation on Eliquis  who presents to the ED complaining of fever.  Patient was admitted to the hospital for 3 days for initiation of Tikosyn  as well as cardioversion last week, discharged home 4 days ago.  She states that shortly after leaving the hospital she began to feel ill with fevers, cough, and some difficulty breathing.  She states her fevers have gotten as high as 101, but she did take a dose of Tylenol  just prior to arrival.  She has had a productive cough with some soreness in the left side of her chest, also states that her breathing feels off.  She has had some diarrhea but denies any nausea or vomiting.  She also denies any abdominal pain or dysuria.  She has felt like her heart is racing and is worried she is back in atrial fibrillation.     Physical Exam   Triage Vital Signs: ED Triage Vitals  Encounter Vitals Group     BP 11/05/23 1951 (!) 142/114     Girls Systolic BP Percentile --      Girls Diastolic BP Percentile --      Boys Systolic BP Percentile --      Boys Diastolic BP Percentile --      Pulse Rate 11/05/23 1951 (!) 136     Resp 11/05/23 1951 (!) 22     Temp 11/05/23 1951 98.9 F (37.2 C)     Temp Source 11/05/23 1951 Oral     SpO2 11/05/23 1951 96 %     Weight 11/05/23 1952 211 lb 10.3 oz (96 kg)     Height 11/05/23 1952 5' 5 (1.651 m)     Head Circumference --      Peak Flow --      Pain Score 11/05/23 1951 6     Pain Loc --      Pain Education --      Exclude from Growth Chart --     Most recent vital signs: Vitals:   11/05/23 1951 11/05/23 2100  BP: (!) 142/114 109/76  Pulse: (!) 136 100  Resp: (!) 22 (!) 25   Temp: 98.9 F (37.2 C)   SpO2: 96% 95%    Constitutional: Alert and oriented. Eyes: Conjunctivae are normal. Head: Atraumatic. Nose: No congestion/rhinnorhea. Mouth/Throat: Mucous membranes are moist.  Cardiovascular: Tachycardic, irregularly irregular rhythm. Grossly normal heart sounds.  2+ radial pulses bilaterally. Respiratory: Normal respiratory effort.  No retractions. Lungs CTAB. Gastrointestinal: Soft and nontender. No distention. Musculoskeletal: No lower extremity tenderness nor edema.  Neurologic:  Normal speech and language. No gross focal neurologic deficits are appreciated.    ED Results / Procedures / Treatments   Labs (all labs ordered are listed, but only abnormal results are displayed) Labs Reviewed  LACTIC ACID, PLASMA - Abnormal; Notable for the following components:      Result Value   Lactic Acid, Venous 3.5 (*)    All other components within normal limits  COMPREHENSIVE METABOLIC PANEL WITH GFR - Abnormal; Notable for the following components:   CO2 21 (*)    Glucose, Bld 168 (*)    Calcium  10.4 (*)  AST 45 (*)    All other components within normal limits  CBC WITH DIFFERENTIAL/PLATELET - Abnormal; Notable for the following components:   Monocytes Absolute 1.1 (*)    Abs Immature Granulocytes 0.10 (*)    All other components within normal limits  DIGOXIN  LEVEL - Abnormal; Notable for the following components:   Digoxin  Level 0.4 (*)    All other components within normal limits  RESP PANEL BY RT-PCR (RSV, FLU A&B, COVID)  RVPGX2  CULTURE, BLOOD (ROUTINE X 2)  CULTURE, BLOOD (ROUTINE X 2)  PROTIME-INR  MAGNESIUM   LACTIC ACID, PLASMA  URINALYSIS, W/ REFLEX TO CULTURE (INFECTION SUSPECTED)  TROPONIN I (HIGH SENSITIVITY)  TROPONIN I (HIGH SENSITIVITY)     EKG  ED ECG REPORT I, Carlin Palin, the attending physician, personally viewed and interpreted this ECG.   Date: 11/05/2023  EKG Time: 19:46  Rate: 108  Rhythm: atrial  fibrillation  Axis: Normal  Intervals:none  ST&T Change: None  RADIOLOGY Chest x-ray reviewed and interpreted by me with no infiltrate, edema, or effusion.  PROCEDURES:  Critical Care performed: Yes, see critical care procedure note(s)  .Critical Care  Performed by: Palin Carlin, MD Authorized by: Palin Carlin, MD   Critical care provider statement:    Critical care time (minutes):  30   Critical care time was exclusive of:  Separately billable procedures and treating other patients and teaching time   Critical care was necessary to treat or prevent imminent or life-threatening deterioration of the following conditions:  Sepsis and cardiac failure (AF RVR)   Critical care was time spent personally by me on the following activities:  Development of treatment plan with patient or surrogate, discussions with consultants, evaluation of patient's response to treatment, examination of patient, ordering and review of laboratory studies, ordering and review of radiographic studies, ordering and performing treatments and interventions, pulse oximetry, re-evaluation of patient's condition and review of old charts   I assumed direction of critical care for this patient from another provider in my specialty: no     Care discussed with: admitting provider      MEDICATIONS ORDERED IN ED: Medications  cefTRIAXone  (ROCEPHIN ) 2 g in sodium chloride  0.9 % 100 mL IVPB (0 g Intravenous Stopped 11/05/23 2110)  lactated ringers  bolus 1,000 mL (1,000 mLs Intravenous New Bag/Given 11/05/23 2038)  dofetilide  (TIKOSYN ) capsule 500 mcg (500 mcg Oral Given 11/05/23 2039)     IMPRESSION / MDM / ASSESSMENT AND PLAN / ED COURSE  I reviewed the triage vital signs and the nursing notes.                              73 y.o. female with past medical history of hypertension, hyperlipidemia, diabetes, and atrial fibrillation on Eliquis  who presents to the ED with fevers, cough, chest pain, shortness of breath,  and diarrhea for the past 4 days.  Patient's presentation is most consistent with acute presentation with potential threat to life or bodily function.  Differential diagnosis includes, but is not limited to, sepsis, pneumonia, UTI, viral syndrome, arrhythmia, dehydration, electrolyte abnormality, AKI.  Patient nontoxic-appearing and in no acute distress, vital signs remarkable for tachycardia and mild tachypnea.  Patient afebrile but did take a dose of Tylenol  just prior to arrival.  Presentation concerning for sepsis and we will start on antibiotics for possible pneumonia.  EKG does show atrial fibrillation with elevated rate, will give IV fluids and hold off on rate  control medication for now but give patient's usual dose of Tikosyn .  Lab results and urinalysis are pending, will also check viral panel.  Chest x-ray without focal infiltrate or other acute finding, viral panel also negative.  Patient was given IV Rocephin  due to concern for sepsis with suspected respiratory source, but no clear source of infection at this time.  Would consider viral illness, heart rate improved following IV fluids.  She does have elevated lactic acid which we will trend, no significant anemia, leukocytosis, electrolyte abnormality, or AKI noted.  LFTs are unremarkable, magnesium  level within normal limits.  Case discussed with hospitalist for admission.      FINAL CLINICAL IMPRESSION(S) / ED DIAGNOSES   Final diagnoses:  Sepsis without acute organ dysfunction, due to unspecified organism [facility])  Atrial fibrillation with RVR (HCC)     Rx / DC Orders   ED Discharge Orders     None        Note:  This document was prepared using Dragon voice recognition software and may include unintentional dictation errors.   Willo Dunnings, MD 11/05/23 2216

## 2023-11-05 NOTE — Assessment & Plan Note (Signed)
 Home digoxin  250 mcg, Tikosyn  home dosing, metoprolol  tartrate 100 mg p.o. twice daily were resumed on admission Home Eliquis  5 mg p.o. twice daily resume

## 2023-11-05 NOTE — Assessment & Plan Note (Addendum)
 With increased heart rate, respiration rate Patient reported fever at home Initial lactic acid 3.5 Blood cultures x 2 are in process, check MRSA PCR, UA has been ordered and pending collection, check procalcitonin Unclear if there is an infectious source, however given elevated lactic acid and patient endorsing cough, fever, chills I have initiated azithromycin  500 mg IV daily and ceftriaxone  2 g IV daily to complete a 5-day course Incentive spirometry, flutter valve

## 2023-11-05 NOTE — Sepsis Progress Note (Signed)
 Following for sepsis monitoring ?

## 2023-11-05 NOTE — Assessment & Plan Note (Signed)
 -  This complicates overall care and prognosis.

## 2023-11-05 NOTE — Consult Note (Signed)
 CODE SEPSIS - PHARMACY COMMUNICATION  **Broad Spectrum Antibiotics should be administered within 1 hour of Sepsis diagnosis**  Time Code Sepsis Called/Page Received: 2007  Antibiotics Ordered: Ceftriaxone  & Azithromycin    Time of 1st antibiotic administration: 2040  Additional action taken by pharmacy: N/A  If necessary, Name of Provider/Nurse Contacted: N/A  Evonnie Nieves ,PharmD Clinical Pharmacist  11/05/2023  8:07 PM

## 2023-11-05 NOTE — Assessment & Plan Note (Signed)
 Insulin SSI with at bedtime coverage ordered

## 2023-11-05 NOTE — H&P (Addendum)
 History and Physical   Lisa Gutierrez FMW:[id number] DOB: 1950-12-31 DOA: 11/05/2023  PCP: Pcp, No  Outpatient Specialists: Dr. Darron Patient coming from: home via EMS  I have personally briefly reviewed patient's old medical records in [facility] EMR.  Chief Concern: Dizziness, fall, fever  HPI: [name] is a 73 year old female with history of GERD, IBS, fatty liver, chronic gastritis, insomnia, atrial fibrillation status post DCCV x 2 and Tikosyn  on 10/29/2023, hypertension, morbid obesity, who presents emergency department for chief concerns of dizziness, and falling today.  Vitals in the ED showed temperature of 98.9, respiration 22, heart rate 136, blood pressure 142/114, SpO2 of 96% on room air.  Serum sodium is 136, potassium 3.9, chloride 102, bicarb 21, BUN of 13, serum creatinine 0.56, EGFR greater than 60, nonfasting glucose 168, WBC 9.5, hemoglobin 13.8, platelet 240  COVID/Influenza A/influenza B/RSV PCR were negative.  Lactic acid elevated at 3.5.  HS troponin is 8.  ED treatment: Ceftriaxone  2 g IV one-time dose, Tikosyn  500 mcg p.o. one-time dose ---------------------------------- At bedside, patient was able to tell me her first and last name, age, location, current calendar year.  She reports that she has been feeling poorly since she was discharged from the hospital after her Tikosyn  admission.  She reports a fever and does not know the exact temperature.  She endorses increasing weakness and falling today.  She did not lose consciousness but did hit her nose a little bit.  She denies hitting her head.  She denies nausea, fever, vomiting, chest pain, dysuria, hematuria, diarrhea, blood in her stool, swelling of her lower extremities.  She endorses increased cough that is nonproductive.  Social history: She is at home with her husband.  She denies tobacco, EtOH, recreational drug use.  She is retired.  ROS: Constitutional: no weight change, no  fever ENT/Mouth: no sore throat, no rhinorrhea Eyes: no eye pain, no vision changes Cardiovascular: no chest pain, no dyspnea,  no edema, no palpitations Respiratory: + cough, no sputum, no wheezing Gastrointestinal: no nausea, no vomiting, no diarrhea, no constipation Genitourinary: no urinary incontinence, no dysuria, no hematuria Musculoskeletal: no arthralgias, no myalgias Skin: no skin lesions, no pruritus, Neuro: + weakness, no loss of consciousness, no syncope Psych: no anxiety, no depression, + decrease appetite Heme/Lymph: no bruising, no bleeding  ED Course: Discussed with EDP, patient requiring hospitalization for chief concerns of atrial fibrillation with RVR.  Assessment/Plan  Principal Problem:   Atrial fibrillation with RVR (HCC) Active Problems:   SIRS (systemic inflammatory response syndrome) (HCC)   Morbid obesity (HCC)   GAD (generalized anxiety disorder)   Gastroesophageal reflux disease without esophagitis   Type 2 diabetes mellitus with hyperglycemia, without long-term current use of insulin  (HCC)   MDD (major depressive disorder), recurrent, in partial remission (HCC)   Statin intolerance   Hyperlipidemia associated with type 2 diabetes mellitus (HCC)   Persistent atrial fibrillation (HCC)   Lactic acid increased   Assessment and Plan:  * Atrial fibrillation with RVR (HCC) workup in progress, differentials include infectious etiology Blood cultures x 2 are in process, UA has been ordered and pending collection at this time Patient is status post ceftriaxone  2 g IV one-time dose Admitted to telemetry cardiac, inpatient  SIRS (systemic inflammatory response syndrome) (HCC) With increased heart rate, respiration rate Patient reported fever at home Initial lactic acid 3.5 Blood cultures x 2 are in process, check MRSA PCR, UA has been ordered and pending collection, check procalcitonin Unclear if  there is an infectious source, however given elevated  lactic acid and patient endorsing cough, fever, chills I have initiated azithromycin  500 mg IV daily and ceftriaxone  2 g IV daily to complete a 5-day course Incentive spirometry, flutter valve  Morbid obesity (HCC) This complicates overall care and prognosis.   Lactic acid increased Blood cultures x 2 are in process  Persistent atrial fibrillation (HCC) Home digoxin  250 mcg, Tikosyn  home dosing, metoprolol  tartrate 100 mg p.o. twice daily were resumed on admission Home Eliquis  5 mg p.o. twice daily resume  Type 2 diabetes mellitus with hyperglycemia, without long-term current use of insulin  (HCC) Insulin  SSI with at bedtime coverage ordered  Gastroesophageal reflux disease without esophagitis Home PPI resumed  GAD (generalized anxiety disorder) PDMP reviewed Home buspirone  5 mg p.o. twice daily, venlafaxine  150 and 37.5 mg daily resumed Home alprazolam  0.5 mg nightly as needed for sleep  Chart reviewed.   Hospitalization 10/29/2023 to 11/01/2023: For persistent atrial fibrillation requiring loading on Tikosyn   DVT prophylaxis: Eliquis  Code Status: full code  Diet: Heart healthy/carb modified Family Communication: no  Disposition Plan: Pending clinical course Consults called: Cardiology, [facility] Admission status: Telemetry cardiac, inpatient  Past Medical History:  Diagnosis Date   Anxiety    Atrial fibrillation (HCC)    Depression    Depression    Phreesia 07/02/2020   Diabetes mellitus without complication (HCC)    GERD (gastroesophageal reflux disease)    Hypertension    IBS (irritable bowel syndrome)    Neuromuscular disorder (HCC)    Past Surgical History:  Procedure Laterality Date   ATRIAL FIBRILLATION ABLATION N/A 05/02/2023   Procedure: ATRIAL FIBRILLATION ABLATION;  Surgeon: Cindie Ole DASEN, MD;  Location: [facility] INVASIVE CV LAB;  Service: Cardiovascular;  Laterality: N/A;   BREAST EXCISIONAL BIOPSY     CARDIOVERSION N/A 09/03/2022   Procedure: CARDIOVERSION;   Surgeon: Darron Deatrice LABOR, MD;  Location: [facility] ORS;  Service: Cardiovascular;  Laterality: N/A;   CARDIOVERSION N/A 06/03/2023   Procedure: CARDIOVERSION;  Surgeon: Darliss Rogue, MD;  Location: [facility] ORS;  Service: Cardiovascular;  Laterality: N/A;   CARDIOVERSION N/A 10/31/2023   Procedure: CARDIOVERSION;  Surgeon: Lonni Slain, MD;  Location: [facility] INVASIVE CV LAB;  Service: Cardiovascular;  Laterality: N/A;   CATARACT EXTRACTION W/PHACO Right 07/24/2022   Procedure: CATARACT EXTRACTION PHACO AND INTRAOCULAR LENS PLACEMENT (IOC) RIGHT DIABETIC  5.93  00:42.5;  Surgeon: Jaye Fallow, MD;  Location: [facility];  Service: Ophthalmology;  Laterality: Right;   CATARACT EXTRACTION W/PHACO Left 08/07/2022   Procedure: CATARACT EXTRACTION PHACO AND INTRAOCULAR LENS PLACEMENT (IOC) LEFT DIABETIC  12.33  00:58.3;  Surgeon: Jaye Fallow, MD;  Location: [facility];  Service: Ophthalmology;  Laterality: Left;   CESAREAN SECTION N/A    Phreesia 07/02/2020   CHOLECYSTECTOMY     EXCISION / BIOPSY BREAST / NIPPLE / DUCT Right 1973   duct removed   SPINE SURGERY N/A    Phreesia 07/02/2020   TUBAL LIGATION N/A    Phreesia 07/02/2020   Social History:  reports that she has quit smoking. She has been exposed to tobacco smoke. She has never used smokeless tobacco. She reports that she does not drink alcohol and does not use drugs.  Allergies  Allergen Reactions   Aspirin Other (See Comments)    GI upset   Ibuprofen Other (See Comments)    GERD   Levofloxacin Other (See Comments)    Weight gain   Meloxicam Other (See Comments)    GI  upset   Morphine Other (See Comments)   Naprosyn  [Naproxen] Other (See Comments)    GI upset   Nsaids Other (See Comments)    GI upset   Ozempic  (0.25 Or 0.5 Mg-Dose) [Semaglutide (0.25 Or 0.5mg -Dos)] Nausea And Vomiting   Propofol  Other (See Comments)    Had horrible nightmares   Quinolones    Robinul  [Glycopyrrolate] Nausea And  Vomiting   Penicillin V Potassium Rash   Sulfa Antibiotics Rash   Family History  Problem Relation Age of Onset   Diabetes Mother    Hypertension Mother    Coronary artery disease Father    Glaucoma Father    Breast cancer Neg Hx    Family history: Family history reviewed and not pertinent.  Prior to Admission medications   Medication Sig Start Date End Date Taking? Authorizing Provider  acetaminophen  (TYLENOL ) 500 MG tablet Take 1,000 mg by mouth every 8 (eight) hours as needed for mild pain (pain score 1-3), moderate pain (pain score 4-6) or headache.    [provider]  ALPRAZolam  (XANAX ) 0.5 MG tablet TAKE 1 TABLET (0.5 MG TOTAL) BY MOUTH AT BEDTIME AS NEEDED. 11/01/23   Chandra Toribio POUR, MD  apixaban  (ELIQUIS ) 5 MG TABS tablet Take 1 tablet (5 mg total) by mouth 2 (two) times daily. 06/19/23   Riddle, Suzann, NP  Blood Glucose Monitoring Suppl (ONE TOUCH ULTRA 2) w/Device KIT CHECK IN THE MORNING, AT NOON, AND AT BEDTIME. MAY SUBSTITUTE TO ANY MANUFACTURER COVERED BY PATIENT'S INSURANCE. 03/04/23   Wallace Joesph LABOR, PA  busPIRone  (BUSPAR ) 5 MG tablet Take 5 mg by mouth 2 (two) times daily.    [provider]  Calcium  Carb-Cholecalciferol  (CALCIUM  600/VITAMIN D3) 600-20 MG-MCG TABS Take 1 tablet by mouth 2 (two) times daily.    [provider]  Cholecalciferol  (VITAMIN D3) 50 MCG (2000 UT) capsule Take 2,000 Units by mouth daily.    [provider]  clobetasol  ointment (TEMOVATE ) 0.05 % Apply topically 2 (two) times daily. Apply topically to affected twice daily until improved. Patient taking differently: Apply 1 Application topically 2 (two) times daily as needed (progeria nodularis). 09/18/22   Boscia, Heather E, NP  digoxin  (LANOXIN ) 0.25 MG tablet TAKE 1 TABLET BY MOUTH EVERY DAY 06/24/23   Hammock, Tylene, NP  Docusate Calcium  (STOOL SOFTENER PO) Take 2 tablets by mouth at bedtime.    [provider]  dofetilide  (TIKOSYN ) 500 MCG capsule Take  1 capsule (500 mcg total) by mouth 2 (two) times daily. 11/01/23   Aniceto Daphne CROME, NP  empagliflozin  (JARDIANCE ) 25 MG TABS tablet Take 1 tablet (25 mg total) by mouth daily before breakfast. 02/06/23   Wallace Joesph A, PA  Flaxseed, Linseed, (FLAXSEED OIL) 1400 MG CAPS Take 1,400 mg by mouth 2 (two) times daily.    [provider]  furosemide  (LASIX ) 20 MG tablet TAKE 1 TABLET BY MOUTH EVERY DAY Patient taking differently: Take 20 mg by mouth daily as needed for fluid. 07/08/23   Gerard Tylene, NP  Glucose Blood (BLOOD GLUCOSE TEST STRIPS) STRP 1 each by In Vitro route in the morning, at noon, and at bedtime. May substitute to any manufacturer covered by patient's insurance. 02/11/23   Wallace Joesph A, PA  glucose blood test strip Use as instructed 02/11/23   Wallace Joesph LABOR, PA  linaclotide  (LINZESS ) 290 MCG CAPS capsule Take 1 capsule (290 mcg total) by mouth daily before breakfast. 07/30/23   Chandra Toribio POUR, MD  losartan  (COZAAR )  25 MG tablet Take 1 tablet (25 mg total) by mouth daily. 10/28/23   Gerard Frederick, NP  magnesium  oxide (MAG-OX) 400 MG tablet Take 1 tablet (400 mg total) by mouth daily. 11/01/23   Aniceto Daphne CROME, NP  metFORMIN  (GLUCOPHAGE -XR) 500 MG 24 hr tablet TAKE 1 TABLET BY MOUTH TWICE A DAY WITH FOOD 10/04/23   Gayle Numbers F, PA-C  metoprolol  tartrate (LOPRESSOR ) 100 MG tablet Take 1 tablet (100 mg total) by mouth 2 (two) times daily. 09/10/23 09/09/24  Gerard Frederick, NP  Multiple Vitamins-Minerals (MULTIVITAMIN ADULT PO) Take 1 tablet by mouth daily. One a day    [provider]  pantoprazole  (PROTONIX ) 40 MG tablet TAKE 1 TABLET BY MOUTH TWICE A DAY 11/01/23   Chandra Toribio POUR, MD  potassium chloride  SA (KLOR-CON  M) 20 MEQ tablet Take 1 tablet (20 mEq total) by mouth daily. 11/01/23   Aniceto Daphne CROME, NP  RYBELSUS  14 MG TABS Take 1 tablet by mouth every morning.    [provider]  venlafaxine  XR (EFFEXOR -XR) 150 MG 24 hr capsule Take 1 capsule (150  mg total) by mouth daily with breakfast. Take total of 187.5 mg daily. Take along with 37.5 mg cap 11/02/23 01/31/24  Vickey Mettle, MD  venlafaxine  XR (EFFEXOR -XR) 37.5 MG 24 hr capsule Take 1 capsule (37.5 mg total) by mouth daily with breakfast. Take total of 187.5 mg daily. Take along with 150 mg cap 11/02/23 01/31/24  Vickey Mettle, MD   Physical Exam: Vitals:   11/05/23 1951 11/05/23 1952 11/05/23 2100  BP: (!) 142/114  109/76  Pulse: (!) 136  100  Resp: (!) 22  (!) 25  Temp: 98.9 F (37.2 C)    TempSrc: Oral    SpO2: 96%  95%  Weight:  96 kg   Height:  5' 5 (1.651 m)    Constitutional: appears stated age, anxious, calm Eyes: PERRL, lids and conjunctivae normal ENMT: Mucous membranes are moist. Posterior pharynx clear of any exudate or lesions. Age-appropriate dentition. Hearing appropriate/ Neck: normal, supple, no masses, no thyromegaly Respiratory: clear to auscultation bilaterally, no wheezing, no crackles. Normal respiratory effort. No accessory muscle use.  Cardiovascular: Regular rate and rhythm, no murmurs / rubs / gallops. No extremity edema. 2+ pedal pulses. No carotid bruits.  Abdomen: Morbidly obese abdomen, no tenderness, no masses palpated, no hepatosplenomegaly. Bowel sounds positive.  Musculoskeletal: no clubbing / cyanosis. No joint deformity upper and lower extremities. Good ROM, no contractures, no atrophy. Normal muscle tone.  Skin: no rashes, lesions, ulcers. No induration Neurologic: Sensation intact. Strength 5/5 in all 4.  Psychiatric: Normal judgment and insight. Alert and oriented x 3.  Depressed mood.  Anxious mood  EKG: independently reviewed, showing atrial fibrillation with RVR rate of 108, QTc 369  Chest x-ray on Admission: I personally reviewed and I agree with radiologist reading as below.  DG Chest Port 1 View Result Date: 11/05/2023 CLINICAL DATA:  Questionable sepsis - evaluate for abnormality EXAM: PORTABLE CHEST - 1 VIEW COMPARISON:   05/09/2023. FINDINGS: Cardiac silhouette enlarged. No evidence of pneumothorax or pleural effusion. No evidence of pulmonary edema. Aorta is calcified. No osseous abnormalities identified. IMPRESSION: Enlarged cardiac silhouette.  Lungs are clear. Electronically Signed   By: Fonda Field M.D.   On: 11/05/2023 20:44   Labs on Admission: I have personally reviewed following labs  CBC: Recent Labs  Lab 11/05/23 2015  WBC 9.5  NEUTROABS 6.2  HGB 13.8  HCT 42.0  MCV 90.3  PLT 240   Basic Metabolic Panel: Recent Labs  Lab 10/30/23 0405 10/31/23 0444 11/01/23 0317 11/05/23 2015  NA 138 137 137 136  K 3.9 4.0 3.9 3.9  CL 101 101 101 102  CO2 25 24 25  21*  GLUCOSE 192* 173* 176* 168*  BUN 10 10 9 13   CREATININE 0.64 0.63 0.64 0.56  CALCIUM  10.2 10.2 9.6 10.4*  MG 2.1 1.8 2.0 2.1   GFR: Estimated Creatinine Clearance: 72.9 mL/min (by C-G formula based on SCr of 0.56 mg/dL).  Liver Function Tests: Recent Labs  Lab 11/05/23 2015  AST 45*  ALT 33  ALKPHOS 54  BILITOT 0.5  PROT 7.6  ALBUMIN 3.6   Coagulation Profile: Recent Labs  Lab 11/05/23 2015  INR 1.1   CBG: Recent Labs  Lab 10/31/23 1036 10/31/23 1701 10/31/23 2137 11/01/23 0804  GLUCAP 215* 155* 182* 222*   Urine analysis:    Component Value Date/Time   COLORURINE AMBER (A) 12/07/2016 1758   APPEARANCEUR Clear 01/19/2022 1311   LABSPEC 1.020 12/07/2016 1758   LABSPEC 1.017 07/16/2012 1129   PHURINE 6.0 12/07/2016 1758   GLUCOSEU 3+ (A) 01/19/2022 1311   GLUCOSEU NEGATIVE 07/16/2012 1129   HGBUR NEGATIVE 12/07/2016 1758   BILIRUBINUR negative 09/25/2022 1059   BILIRUBINUR Negative 01/19/2022 1311   BILIRUBINUR NEGATIVE 07/16/2012 1129   KETONESUR small (15) (A) 09/25/2022 1059   KETONESUR NEGATIVE 12/07/2016 1758   PROTEINUR negative 06/26/2022 1953   PROTEINUR Negative 01/19/2022 1311   PROTEINUR NEGATIVE 12/07/2016 1758   UROBILINOGEN 0.2 09/25/2022 1059   NITRITE Negative 09/25/2022 1059    NITRITE Negative 01/19/2022 1311   NITRITE NEGATIVE 12/07/2016 1758   LEUKOCYTESUR Negative 09/25/2022 1059   LEUKOCYTESUR 1+ (A) 01/19/2022 1311   LEUKOCYTESUR 3+ 07/16/2012 1129   This document was prepared using Dragon Voice Recognition software and may include unintentional dictation errors.  Dr. Sherre [facility]  If 7PM-7AM, please contact overnight-coverage provider If 7AM-7PM, please contact day attending provider [email or web address]  11/05/2023, 10:58 PM

## 2023-11-06 DIAGNOSIS — I4819 Other persistent atrial fibrillation: Secondary | ICD-10-CM

## 2023-11-06 DIAGNOSIS — E1165 Type 2 diabetes mellitus with hyperglycemia: Secondary | ICD-10-CM

## 2023-11-06 DIAGNOSIS — I4891 Unspecified atrial fibrillation: Secondary | ICD-10-CM | POA: Diagnosis not present

## 2023-11-06 DIAGNOSIS — R6510 Systemic inflammatory response syndrome (SIRS) of non-infectious origin without acute organ dysfunction: Secondary | ICD-10-CM

## 2023-11-06 LAB — BASIC METABOLIC PANEL WITH GFR
Anion gap: 14 (ref 5–15)
BUN: 10 mg/dL (ref 8–23)
CO2: 20 mmol/L — ABNORMAL LOW (ref 22–32)
Calcium: 9.4 mg/dL (ref 8.9–10.3)
Chloride: 103 mmol/L (ref 98–111)
Creatinine, Ser: 0.59 mg/dL (ref 0.44–1.00)
GFR, Estimated: >60 mL/min (ref >60)
Glucose, Bld: 186 mg/dL — ABNORMAL HIGH (ref 70–99)
Potassium: 3.7 mmol/L (ref 3.5–5.1)
Sodium: 137 mmol/L (ref 135–145)

## 2023-11-06 LAB — CBC
HCT: 39.2 % (ref 36.0–46.0)
Hemoglobin: 13 g/dL (ref 12.0–15.0)
MCH: 30.2 pg (ref 26.0–34.0)
MCHC: 33.2 g/dL (ref 30.0–36.0)
MCV: 91.2 fL (ref 80.0–100.0)
Platelets: 228 K/uL (ref 150–400)
RBC: 4.3 MIL/uL (ref 3.87–5.11)
RDW: 14 % (ref 11.5–15.5)
WBC: 9.4 K/uL (ref 4.0–10.5)
nRBC: 0 % (ref 0.0–0.2)

## 2023-11-06 LAB — MRSA NEXT GEN BY PCR, NASAL: MRSA by PCR Next Gen: NOT DETECTED

## 2023-11-06 LAB — CBG MONITORING, ED
Glucose-Capillary: 143 mg/dL — ABNORMAL HIGH (ref 70–99)
Glucose-Capillary: 150 mg/dL — ABNORMAL HIGH (ref 70–99)
Glucose-Capillary: 159 mg/dL — ABNORMAL HIGH (ref 70–99)
Glucose-Capillary: 234 mg/dL — ABNORMAL HIGH (ref 70–99)

## 2023-11-06 LAB — MAGNESIUM: Magnesium: 1.9 mg/dL (ref 1.7–2.4)

## 2023-11-06 LAB — HEMOGLOBIN A1C
Hgb A1c MFr Bld: 7.6 % — ABNORMAL HIGH (ref 4.8–5.6)
Mean Plasma Glucose: 171.42 mg/dL

## 2023-11-06 LAB — URINALYSIS, W/ REFLEX TO CULTURE (INFECTION SUSPECTED)
Bacteria, UA: NONE SEEN
Bilirubin Urine: NEGATIVE
Glucose, UA: >=500 mg/dL — AB
Hgb urine dipstick: NEGATIVE
Ketones, ur: NEGATIVE mg/dL
Leukocytes,Ua: NEGATIVE
Nitrite: NEGATIVE
Protein, ur: NEGATIVE mg/dL
Specific Gravity, Urine: 1.009 (ref 1.005–1.030)
pH: 6 (ref 5.0–8.0)

## 2023-11-06 LAB — PROCALCITONIN: Procalcitonin: <0.1 ng/mL

## 2023-11-06 LAB — TROPONIN I (HIGH SENSITIVITY): Troponin I (High Sensitivity): 7 ng/L (ref <18)

## 2023-11-06 MED ORDER — POTASSIUM CHLORIDE CRYS ER 20 MEQ PO TBCR: 40.0000 meq | EXTENDED_RELEASE_TABLET | Freq: Once | ORAL | Status: DC

## 2023-11-06 MED ORDER — LINACLOTIDE 145 MCG PO CAPS
145.0000 ug | ORAL_CAPSULE | Freq: Every day | ORAL | Status: DC
  Administered 2023-11-07: 145 ug via ORAL
  Filled 2023-11-06: qty 1

## 2023-11-06 MED ORDER — POTASSIUM CHLORIDE CRYS ER 20 MEQ PO TBCR
60.0000 meq | EXTENDED_RELEASE_TABLET | Freq: Once | ORAL | Status: AC
  Administered 2023-11-06: 60 meq via ORAL
  Filled 2023-11-06: qty 3

## 2023-11-06 MED ORDER — MAGNESIUM SULFATE 2 GM/50ML IV SOLN
2.0000 g | Freq: Once | INTRAVENOUS | Status: AC
  Administered 2023-11-06: 2 g via INTRAVENOUS
  Filled 2023-11-06: qty 50

## 2023-11-06 NOTE — Consult Note (Signed)
 Cardiology Consultation   Patient ID: Lisa Gutierrez MRN: [id number]; DOB: 1951/04/01  Admit date: 11/05/2023 Date of Consult: 11/06/2023  PCP:  Freddrick Johns   [facility] Providers Cardiologist:  Deatrice Cage, MD  Cardiology APP:  Gerard Frederick, NP  Electrophysiologist:  [name], MD  Physician requesting consult Dr. Laurita Reason for consult: Atrial fibrillation  Patient Profile: Lisa Gutierrez is a 73 y.o. female with a hx of persistent atrial fibrillation, severe anxiety/depression, IBS, diabetes type 2, obesity, hypertension presenting with low-grade fevers, general malaise weakness, shortness of breath, recurrent atrial fibrillation  History of Present Illness: Lisa Gutierrez was recently discharged from [facility] on the July 18 after cardioversion for atrial fibrillation, dofetilide  loading.  Discharged in normal sinus rhythm She reports after 24 hours at home she developed shortness of breath consistent with recurrent atrial fibrillation, also had low-grade fevers -Symptoms have persisted over the past several days with fever, weakness as well as palpitations and shortness of breath Reports having a fall yesterday from leg weakness but did not lose consciousness Reports compliance with her medications On arrival noted to be in atrial fibrillation with rate in the 130s  Lactic acid 3.5 Chest x-ray nonacute Viral panel negative Concern for sepsis, started on Rocephin  On rounds this morning heart rate 100 up to 110 A-fib, tearful at times, reports feeling weak, very stressed with medical issues over the past 2 years   Past Medical History:  Diagnosis Date   Anxiety    Atrial fibrillation (HCC)    Depression    Depression    Phreesia 07/02/2020   Diabetes mellitus without complication (HCC)    GERD (gastroesophageal reflux disease)    Hypertension    IBS (irritable bowel syndrome)    Neuromuscular disorder (HCC)     Past Surgical  History:  Procedure Laterality Date   ATRIAL FIBRILLATION ABLATION N/A 05/02/2023   Procedure: ATRIAL FIBRILLATION ABLATION;  Surgeon: HOLTS OLE ONEIDA, MD;  Location: [facility] INVASIVE CV LAB;  Service: Cardiovascular;  Laterality: N/A;   BREAST EXCISIONAL BIOPSY     CARDIOVERSION N/A 09/03/2022   Procedure: CARDIOVERSION;  Surgeon: Cage Deatrice LABOR, MD;  Location: [facility] ORS;  Service: Cardiovascular;  Laterality: N/A;   CARDIOVERSION N/A 06/03/2023   Procedure: CARDIOVERSION;  Surgeon: Darliss Rogue, MD;  Location: [facility] ORS;  Service: Cardiovascular;  Laterality: N/A;   CARDIOVERSION N/A 10/31/2023   Procedure: CARDIOVERSION;  Surgeon: Lonni Slain, MD;  Location: [facility] INVASIVE CV LAB;  Service: Cardiovascular;  Laterality: N/A;   CATARACT EXTRACTION W/PHACO Right 07/24/2022   Procedure: CATARACT EXTRACTION PHACO AND INTRAOCULAR LENS PLACEMENT (IOC) RIGHT DIABETIC  5.93  00:42.5;  Surgeon: Jaye Fallow, MD;  Location: [facility] SURGERY [facility];  Service: Ophthalmology;  Laterality: Right;   CATARACT EXTRACTION W/PHACO Left 08/07/2022   Procedure: CATARACT EXTRACTION PHACO AND INTRAOCULAR LENS PLACEMENT (IOC) LEFT DIABETIC  12.33  00:58.3;  Surgeon: Jaye Fallow, MD;  Location: [facility];  Service: Ophthalmology;  Laterality: Left;   CESAREAN SECTION N/A    Phreesia 07/02/2020   CHOLECYSTECTOMY     EXCISION / BIOPSY BREAST / NIPPLE / DUCT Right 1973   duct removed   SPINE SURGERY N/A    Phreesia 07/02/2020   TUBAL LIGATION N/A    Phreesia 07/02/2020     Home Medications:  Prior to Admission medications   Medication Sig Start Date End Date Taking? Authorizing Provider  acetaminophen  (TYLENOL ) 500 MG tablet Take 1,000 mg by mouth every 8 (eight) hours as  needed for mild pain (pain score 1-3), moderate pain (pain score 4-6) or headache.   Yes [provider]  ALPRAZolam  (XANAX ) 0.5 MG tablet TAKE 1 TABLET (0.5 MG TOTAL) BY MOUTH AT BEDTIME AS NEEDED. 11/01/23  Yes  Chandra Toribio POUR, MD  apixaban  (ELIQUIS ) 5 MG TABS tablet Take 1 tablet (5 mg total) by mouth 2 (two) times daily. 06/19/23  Yes Riddle, Suzann, NP  busPIRone  (BUSPAR ) 5 MG tablet Take 5 mg by mouth 2 (two) times daily.   Yes [provider]  Calcium  Carb-Cholecalciferol  (CALCIUM  600/VITAMIN D3) 600-20 MG-MCG TABS Take 1 tablet by mouth 2 (two) times daily.   Yes [provider]  Cholecalciferol  (VITAMIN D3) 50 MCG (2000 UT) capsule Take 2,000 Units by mouth daily.   Yes [provider]  clobetasol  ointment (TEMOVATE ) 0.05 % Apply topically 2 (two) times daily. Apply topically to affected twice daily until improved. Patient taking differently: Apply 1 Application topically 2 (two) times daily as needed (progeria nodularis). 09/18/22  Yes Boscia, Heather E, NP  digoxin  (LANOXIN ) 0.25 MG tablet TAKE 1 TABLET BY MOUTH EVERY DAY 06/24/23  Yes Hammock, Sheri, NP  Docusate Calcium  (STOOL SOFTENER PO) Take 2 tablets by mouth at bedtime.   Yes [provider]  dofetilide  (TIKOSYN ) 500 MCG capsule Take 1 capsule (500 mcg total) by mouth 2 (two) times daily. 11/01/23  Yes Aniceto Daphne CROME, NP  empagliflozin  (JARDIANCE ) 25 MG TABS tablet Take 1 tablet (25 mg total) by mouth daily before breakfast. 02/06/23  Yes Edstrom, Morgan A, PA  Flaxseed, Linseed, (FLAXSEED OIL) 1400 MG CAPS Take 1,400 mg by mouth 2 (two) times daily.   Yes [provider]  furosemide  (LASIX ) 20 MG tablet TAKE 1 TABLET BY MOUTH EVERY DAY Patient taking differently: Take 20 mg by mouth daily as needed for fluid. 07/08/23  Yes Gerard Frederick, NP  linaclotide  (LINZESS ) 290 MCG CAPS capsule Take 1 capsule (290 mcg total) by mouth daily before breakfast. 07/30/23  Yes Chandra Toribio POUR, MD  losartan  (COZAAR ) 25 MG tablet Take 1 tablet (25 mg total) by mouth daily. 10/28/23  Yes Hammock, Sheri, NP  magnesium  oxide (MAG-OX) 400 MG tablet Take 1 tablet (400 mg total) by mouth daily. 11/01/23  Yes Aniceto Daphne CROME, NP   metFORMIN  (GLUCOPHAGE -XR) 500 MG 24 hr tablet TAKE 1 TABLET BY MOUTH TWICE A DAY WITH FOOD 10/04/23  Yes Clapp, Kara F, PA-C  metoprolol  tartrate (LOPRESSOR ) 100 MG tablet Take 1 tablet (100 mg total) by mouth 2 (two) times daily. 09/10/23 09/09/24 Yes Hammock, Frederick, NP  Multiple Vitamins-Minerals (MULTIVITAMIN ADULT PO) Take 1 tablet by mouth daily. One a day   Yes [provider]  pantoprazole  (PROTONIX ) 40 MG tablet TAKE 1 TABLET BY MOUTH TWICE A DAY 11/01/23  Yes Chandra Toribio POUR, MD  potassium chloride  SA (KLOR-CON  M) 20 MEQ tablet Take 1 tablet (20 mEq total) by mouth daily. 11/01/23  Yes Ollis, Brandi L, NP  RYBELSUS  14 MG TABS Take 1 tablet by mouth every morning.   Yes [provider]  venlafaxine  XR (EFFEXOR -XR) 150 MG 24 hr capsule Take 1 capsule (150 mg total) by mouth daily with breakfast. Take total of 187.5 mg daily. Take along with 37.5 mg cap 11/02/23 01/31/24 Yes Hisada, Katheren, MD  venlafaxine  XR (EFFEXOR -XR) 37.5 MG 24 hr capsule Take 1 capsule (37.5 mg total) by mouth daily with breakfast. Take total of 187.5 mg daily. Take along with 150 mg cap 11/02/23 01/31/24 Yes  Vickey Mettle, MD  Blood Glucose Monitoring Suppl (ONE TOUCH ULTRA 2) w/Device KIT CHECK IN THE MORNING, AT NOON, AND AT BEDTIME. MAY SUBSTITUTE TO ANY MANUFACTURER COVERED BY PATIENT'S INSURANCE. 03/04/23   Wallace Search A, PA  Glucose Blood (BLOOD GLUCOSE TEST STRIPS) STRP 1 each by In Vitro route in the morning, at noon, and at bedtime. May substitute to any manufacturer covered by patient's insurance. 02/11/23   Wallace Search A, PA  glucose blood test strip Use as instructed 02/11/23   Wallace Search A, PA    Scheduled Meds:  apixaban   5 mg Oral BID   busPIRone   5 mg Oral BID   digoxin   250 mcg Oral Daily   docusate sodium   100 mg Oral QHS   dofetilide   500 mcg Oral BID   insulin  aspart  0-15 Units Subcutaneous TID WC   insulin  aspart  0-5 Units Subcutaneous QHS   losartan   25 mg Oral Daily    metoprolol  tartrate  100 mg Oral BID   pantoprazole   40 mg Oral BID   venlafaxine  XR  187.5 mg Oral Q breakfast   Continuous Infusions:  sodium chloride  150 mL/hr at 11/06/23 0803   azithromycin  Stopped (11/06/23 0132)   cefTRIAXone  (ROCEPHIN )  IV     magnesium  sulfate bolus IVPB 2 g (11/06/23 1329)   PRN Meds: acetaminophen  **OR** acetaminophen , ALPRAZolam , hydrALAZINE , ondansetron  **OR** ondansetron  (ZOFRAN ) IV  Allergies:    Allergies  Allergen Reactions   Aspirin Other (See Comments)    GI upset   Ibuprofen Other (See Comments)    GERD   Levofloxacin Other (See Comments)    Weight gain   Meloxicam Other (See Comments)    GI upset   Naprosyn  [Naproxen] Other (See Comments)    GI upset   Nsaids Other (See Comments)    GI upset   Ozempic  (0.25 Or 0.5 Mg-Dose) [Semaglutide (0.25 Or 0.5mg -Dos)] Nausea And Vomiting   Propofol  Other (See Comments)    Had horrible nightmares   Quinolones    Robinul  [Glycopyrrolate] Nausea And Vomiting   Morphine Swelling, Rash and Other (See Comments)   Penicillin V Potassium Rash   Sulfa Antibiotics Rash    Social History:   Social History   Socioeconomic History   Marital status: Married    Spouse name: Not on file   Number of children: Not on file   Years of education: Not on file   Highest education level: Not on file  Occupational History   Not on file  Tobacco Use   Smoking status: Former    Passive exposure: Past   Smokeless tobacco: Never   Tobacco comments:    Former smoker 10/29/23  Vaping Use   Vaping status: Never Used  Substance and Sexual Activity   Alcohol use: No    Alcohol/week: 0.0 standard drinks of alcohol   Drug use: Never   Sexual activity: Not Currently    Partners: Male  Other Topics Concern   Not on file  Social History Narrative   Not on file   Social Drivers of Health   Financial Resource Strain: Low Risk  (10/15/2022)   Overall Financial Resource Strain (CARDIA)    Difficulty of Paying  Living Expenses: Not very hard  Food Insecurity: No Food Insecurity (11/06/2023)   Hunger Vital Sign    Worried About Running Out of Food in the Last Year: Never true    Ran Out of Food in the Last Year: Never true  Transportation  Needs: No Transportation Needs (11/06/2023)   PRAPARE - Administrator, Civil Service (Medical): No    Lack of Transportation (Non-Medical): No  Physical Activity: Inactive (08/09/2022)   Exercise Vital Sign    Days of Exercise per Week: 0 days    Minutes of Exercise per Session: 0 min  Stress: Stress Concern Present (10/15/2022)   [employer] of Occupational Health - Occupational Stress Questionnaire    Feeling of Stress : Rather much  Social Connections: Moderately Integrated (11/06/2023)   Social Connection and Isolation Panel    Frequency of Communication with Friends and Family: More than three times a week    Frequency of Social Gatherings with Friends and Family: More than three times a week    Attends Religious Services: 1 to 4 times per year    Active Member of [employer] or Organizations: No    Attends Banker Meetings: Never    Marital Status: Married  Catering manager Violence: Not At Risk (11/06/2023)   Humiliation, Afraid, Rape, and Kick questionnaire    Fear of Current or Ex-Partner: No    Emotionally Abused: No    Physically Abused: No    Sexually Abused: No    Family History:    Family History  Problem Relation Age of Onset   Diabetes Mother    Hypertension Mother    Coronary artery disease Father    Glaucoma Father    Breast cancer Neg Hx      ROS:  Please see the history of present illness.  Review of Systems  Constitutional: Negative.   HENT: Negative.    Respiratory:  Positive for shortness of breath.   Cardiovascular:  Positive for palpitations.  Gastrointestinal: Negative.   Musculoskeletal: Negative.   Neurological: Negative.   Psychiatric/Behavioral:  The patient is nervous/anxious.   All other  systems reviewed and are negative.   Physical Exam/Data: Vitals:   11/06/23 0730 11/06/23 0844 11/06/23 1230 11/06/23 1242  BP: 112/74  117/84   Pulse: 80 63 (!) 117   Resp: (!) 31 15 (!) 27   Temp:  98.3 F (36.8 C)  99 F (37.2 C)  TempSrc:  Oral  Oral  SpO2: 95% 97% 95%   Weight:      Height:        Intake/Output Summary (Last 24 hours) at 11/06/2023 1337 Last data filed at 11/05/2023 2110 Gross per 24 hour  Intake 99.18 ml  Output --  Net 99.18 ml      11/05/2023    7:52 PM 10/29/2023    5:45 PM 10/29/2023    9:44 AM  Last 3 Weights  Weight (lbs) 211 lb 10.3 oz 210 lb 11.2 oz 207 lb 9.6 oz  Weight (kg) 96 kg 95.573 kg 94.167 kg     Body mass index is 35.22 kg/m.  General:  Well nourished, well developed, in no acute distress HEENT: normal Neck: no JVD Vascular: No carotid bruits; Distal pulses 2+ bilaterally Cardiac: Irregularly irregular RRR; no murmur  Lungs:  clear to auscultation bilaterally, no wheezing, rhonchi or rales  Abd: soft, nontender, no hepatomegaly  Ext: no edema Musculoskeletal:  No deformities, BUE and BLE strength normal and equal Skin: warm and dry  Neuro:  CNs 2-12 intact, no focal abnormalities noted Psych:  Normal affect   EKG:  The EKG was personally reviewed and demonstrates: Atrial fibrillation rate 108 bpm nonspecific ST-T wave abnormality  Telemetry:  Telemetry was personally reviewed and demonstrates: Atrial fibrillation  rate 110 bpm  Relevant CV Studies: Echo March 2024 Left ventricular ejection fraction, by estimation, is 50 to 55%. The  left ventricle has low normal function. The left ventricle has no regional  wall motion abnormalities. There is mild left ventricular hypertrophy.  Left ventricular diastolic  parameters are indeterminate.   2. Right ventricular systolic function is normal. The right ventricular  size is normal. Tricuspid regurgitation signal is inadequate for assessing  PA pressure.   3. Left atrial size  was moderately dilated.   4. The mitral valve is normal in structure. No evidence of mitral valve  regurgitation. No evidence of mitral stenosis.   5. The aortic valve is normal in structure. Aortic valve regurgitation is  not visualized. No aortic stenosis is present.   Laboratory Data: High Sensitivity Troponin:   Recent Labs  Lab 11/05/23 2015 11/05/23 2319  TROPONINIHS 8 7     Chemistry Recent Labs  Lab 11/01/23 0317 11/05/23 2015 11/06/23 0232  NA 137 136 137  K 3.9 3.9 3.7  CL 101 102 103  CO2 25 21* 20*  GLUCOSE 176* 168* 186*  BUN 9 13 10   CREATININE 0.64 0.56 0.59  CALCIUM  9.6 10.4* 9.4  MG 2.0 2.1 1.9  GFRNONAA >60 >60 >60  ANIONGAP 11 13 14     Recent Labs  Lab 11/05/23 2015  PROT 7.6  ALBUMIN 3.6  AST 45*  ALT 33  ALKPHOS 54  BILITOT 0.5   Lipids No results for input(s): CHOL, TRIG, HDL, LABVLDL, LDLCALC, CHOLHDL in the last 168 hours.  Hematology Recent Labs  Lab 11/05/23 2015 11/06/23 0232  WBC 9.5 9.4  RBC 4.65 4.30  HGB 13.8 13.0  HCT 42.0 39.2  MCV 90.3 91.2  MCH 29.7 30.2  MCHC 32.9 33.2  RDW 14.1 14.0  PLT 240 228   Thyroid  No results for input(s): TSH, FREET4 in the last 168 hours.  BNPNo results for input(s): BNP, PROBNP in the last 168 hours.  DDimer No results for input(s): DDIMER in the last 168 hours.  Radiology/Studies:  DG Chest Port 1 View Result Date: 11/05/2023 CLINICAL DATA:  Questionable sepsis - evaluate for abnormality EXAM: PORTABLE CHEST - 1 VIEW COMPARISON:  05/09/2023. FINDINGS: Cardiac silhouette enlarged. No evidence of pneumothorax or pleural effusion. No evidence of pulmonary edema. Aorta is calcified. No osseous abnormalities identified. IMPRESSION: Enlarged cardiac silhouette.  Lungs are clear. Electronically Signed   By: Fonda Field M.D.   On: 11/05/2023 20:44     Assessment and Plan: --Persistent atrial fibrillation with RVR Exacerbated by underlying infection, suspected viral  syndrome with fever malaise diarrhea weakness -Supportive care for now for infection - If she remains in atrial fibrillation we will need to consider repeat cardioversion - EP to follow - Continue Eliquis , dofetilide , metoprolol , digoxin  - Close monitoring of electrolytes  Viral syndrome/SIRS Weakness, low-grade fever, diarrhea -Viral workup negative - Blood cultures pending, supportive care for now  Generalized anxiety disorder Reports she has had issues over the past 2 years Will defer to medicine service, options may be somewhat limited in effort to avoid any QTc prolonging medication  Diabetes type 2 On metformin , Jardiance   For questions or updates, please contact [facility] Please consult [email or web address] for contact info under    Signed, Modesty Rudy, MD  11/06/2023 1:37 PM

## 2023-11-06 NOTE — Consult Note (Signed)
 ELECTROPHYSIOLOGY CONSULT NOTE    Patient ID: Lisa Gutierrez MRN: [id number], DOB/AGE: [birth year] 73 y.o.  Admit date: 11/05/2023 Date of Consult: 11/06/2023  Primary Physician: Pcp, No Primary Cardiologist: Deatrice Cage, MD  Electrophysiologist: Dr. Cindie   Referring Provider: @ATTENDING @  Patient Profile: Lisa Gutierrez is a 73 y.o. female with a history of AFib, HTN, T2DM, IBS, severe anxiety who is being seen today for the evaluation of Afib w RVR at the request of Dr. Laurita.  HPI:  Lisa Gutierrez is a 73 y.o. female with PMH as above who is well known to EP team.  She is s/p AF abaltion 04/2023, had recurrence of AFib and was recently admitted for tikosyn  loading earlier this month.   She presented to [facility] ER after feeling poorly at home for past several weeks with chills, body aches, subjective fevers, non-productive cough. She also noticed her HR was variable.   She denies chest pain, chest pressure, increased edema. She has been taking tikosyn  q12h without missing doses. She continues to take eliquis  BID without bleeding concerns.   At admission, her lactate was > 3. Covid, flu, rsv all negative. Urine cx, blood cultures pending.    Labs Potassium3.7 (07/23 0232) Magnesium   1.9 (07/23 0232) Creatinine, ser  0.59 (07/23 0232) PLT  228 (07/23 0232) HGB  13.0 (07/23 0232) WBC 9.4 (07/23 0232) Troponin I (High Sensitivity)7 (07/22 2319).    Past Medical History:  Diagnosis Date   Anxiety    Atrial fibrillation (HCC)    Depression    Depression    Phreesia 07/02/2020   Diabetes mellitus without complication (HCC)    GERD (gastroesophageal reflux disease)    Hypertension    IBS (irritable bowel syndrome)    Neuromuscular disorder (HCC)      Surgical History:  Past Surgical History:  Procedure Laterality Date   ATRIAL FIBRILLATION ABLATION N/A 05/02/2023   Procedure: ATRIAL FIBRILLATION ABLATION;  Surgeon: Cindie Ole DASEN, MD;   Location: [facility] INVASIVE CV LAB;  Service: Cardiovascular;  Laterality: N/A;   BREAST EXCISIONAL BIOPSY     CARDIOVERSION N/A 09/03/2022   Procedure: CARDIOVERSION;  Surgeon: Cage Deatrice LABOR, MD;  Location: [facility] ORS;  Service: Cardiovascular;  Laterality: N/A;   CARDIOVERSION N/A 06/03/2023   Procedure: CARDIOVERSION;  Surgeon: Darliss Rogue, MD;  Location: [facility] ORS;  Service: Cardiovascular;  Laterality: N/A;   CARDIOVERSION N/A 10/31/2023   Procedure: CARDIOVERSION;  Surgeon: Lonni Slain, MD;  Location: [facility] INVASIVE CV LAB;  Service: Cardiovascular;  Laterality: N/A;   CATARACT EXTRACTION W/PHACO Right 07/24/2022   Procedure: CATARACT EXTRACTION PHACO AND INTRAOCULAR LENS PLACEMENT (IOC) RIGHT DIABETIC  5.93  00:42.5;  Surgeon: Jaye Fallow, MD;  Location: [facility];  Service: Ophthalmology;  Laterality: Right;   CATARACT EXTRACTION W/PHACO Left 08/07/2022   Procedure: CATARACT EXTRACTION PHACO AND INTRAOCULAR LENS PLACEMENT (IOC) LEFT DIABETIC  12.33  00:58.3;  Surgeon: Jaye Fallow, MD;  Location: [facility];  Service: Ophthalmology;  Laterality: Left;   CESAREAN SECTION N/A    Phreesia 07/02/2020   CHOLECYSTECTOMY     EXCISION / BIOPSY BREAST / NIPPLE / DUCT Right 1973   duct removed   SPINE SURGERY N/A    Phreesia 07/02/2020   TUBAL LIGATION N/A    Phreesia 07/02/2020     (Not in a hospital admission)   Inpatient Medications:   apixaban   5 mg Oral BID   busPIRone   5 mg Oral BID   digoxin   250 mcg  Oral Daily   docusate sodium   100 mg Oral QHS   dofetilide   500 mcg Oral BID   insulin  aspart  0-15 Units Subcutaneous TID WC   insulin  aspart  0-5 Units Subcutaneous QHS   losartan   25 mg Oral Daily   metoprolol  tartrate  100 mg Oral BID   pantoprazole   40 mg Oral BID   venlafaxine  XR  187.5 mg Oral Q breakfast    Allergies:  Allergies  Allergen Reactions   Aspirin Other (See Comments)    GI upset   Ibuprofen Other (See Comments)     GERD   Levofloxacin Other (See Comments)    Weight gain   Meloxicam Other (See Comments)    GI upset   Naprosyn  [Naproxen] Other (See Comments)    GI upset   Nsaids Other (See Comments)    GI upset   Ozempic  (0.25 Or 0.5 Mg-Dose) [Semaglutide (0.25 Or 0.5mg -Dos)] Nausea And Vomiting   Propofol  Other (See Comments)    Had horrible nightmares   Quinolones    Robinul  [Glycopyrrolate] Nausea And Vomiting   Morphine Swelling, Rash and Other (See Comments)   Penicillin V Potassium Rash   Sulfa Antibiotics Rash    Family History  Problem Relation Age of Onset   Diabetes Mother    Hypertension Mother    Coronary artery disease Father    Glaucoma Father    Breast cancer Neg Hx      Physical Exam: Vitals:   11/06/23 0730 11/06/23 0844 11/06/23 1230 11/06/23 1242  BP: 112/74  117/84   Pulse: 80 63 (!) 117   Resp: (!) 31 15 (!) 27   Temp:  98.3 F (36.8 C)  99 F (37.2 C)  TempSrc:  Oral  Oral  SpO2: 95% 97% 95%   Weight:      Height:        GEN- standing in room brushing teeth. Teary HEENT: Normocephalic, atraumatic Lungs- CTAB, Normal effort.  Heart- irregular rate and rhythm, No M/G/R.  GI- Soft, NT, ND.  Extremities- No clubbing, cyanosis, or edema   Radiology/Studies: DG Chest Port 1 View Result Date: 11/05/2023 CLINICAL DATA:  Questionable sepsis - evaluate for abnormality EXAM: PORTABLE CHEST - 1 VIEW COMPARISON:  05/09/2023. FINDINGS: Cardiac silhouette enlarged. No evidence of pneumothorax or pleural effusion. No evidence of pulmonary edema. Aorta is calcified. No osseous abnormalities identified. IMPRESSION: Enlarged cardiac silhouette.  Lungs are clear. Electronically Signed   By: Fonda Field M.D.   On: 11/05/2023 20:44   EP STUDY Result Date: 10/31/2023 See surgical note for result.   EKG: AFib at 108, low voltage QT 276, QTC 369 (personally reviewed)  TELEMETRY: afib 100-130s (personally reviewed)   Assessment/Plan: #) persis AFib #) tikosyn    #) body aches, fever, cough  Patient presented to [facility] ER with AFib w RVR associated with infectious etiology as yet unidentified.  HR variable 100-130 with activty/anxiety Anticipate as her infection resolves, her ventricular rates will normalize Consider DCCV outpatient if remains in afib  Continue 500mcg tikosyn  q12h Continue 5mg  eliquis  BID Keep K > 4, Mag > 2 -- K repleted this AM    EP will sign off at this time, but remains available.  She is scheduled to see AF clinic in [place] 7/24, will reschedule to early next week    Signed, Shawnell Dykes, NP  11/06/2023 1:31 PM

## 2023-11-06 NOTE — Progress Notes (Signed)
 Pharmacy: Dofetilide  (Tikosyn ) - Follow Up Assessment and Electrolyte Replacement  74 y.o. female admitted on 11/05/2023 for syncope. Patient's home tikosyn  has been resumed on admission and pharmacy has been consulted to to assist in monitoring and replacing electrolytes.  Labs:    Component Value Date/Time   K 3.7 11/06/2023 0232   K 3.5 08/03/2013 1542   MG 2.1 11/05/2023 2015     Plan: Potassium: K 3.5-3.7:  Give KCl 60 mEq po x1   Magnesium : Mg 1.8-2: Give Mg 2 gm IV x1    Thank you for involving pharmacy in this patient's care.   Damien Napoleon, PharmD Clinical Pharmacist 11/06/2023 11:18 AM

## 2023-11-06 NOTE — Progress Notes (Signed)
  Progress Note   Patient: Lisa Gutierrez FMW:[id number] DOB: [birth year] DOA: 11/05/2023     1 DOS: the patient was seen and examined on 11/06/2023   Brief hospital course: Ms. Mozel Burdett is a 73 year old female with history of GERD, IBS, fatty liver, chronic gastritis, insomnia, atrial fibrillation status post DCCV x 2 and Tikosyn  on 10/29/2023, hypertension, morbid obesity, who presents emergency department for chief concerns of dizziness, and falling today. She is found to have atrial fibrillation with RVR.  Cardiology consult obtained.   Principal Problem:   Atrial fibrillation with RVR (HCC) Active Problems:   SIRS (systemic inflammatory response syndrome) (HCC)   Morbid obesity (HCC)   GAD (generalized anxiety disorder)   Gastroesophageal reflux disease without esophagitis   Type 2 diabetes mellitus with hyperglycemia, without long-term current use of insulin  (HCC)   MDD (major depressive disorder), recurrent, in partial remission (HCC)   Statin intolerance   Hyperlipidemia associated with type 2 diabetes mellitus (HCC)   Sepsis without acute organ dysfunction (HCC)   Persistent atrial fibrillation (HCC)   Lactic acid increased   Assessment and Plan: *Persistent atrial fibrillation with RVR (HCC) Patient is followed by cardiology, he recently was started on Tikosyn , had a cardioversion about a week ago.  Came back again with atrial fibrillation.  Cardiology consult obtained, patient was restarted on Tikosyn , beta-blocker and digoxin , also on Eliquis .  Will continue to follow.  SIRS (systemic inflammatory response syndrome) (HCC) Upper respiratory symptoms. Recently had a fever, upper resp symptoms, some loose stools.  However, chest x-ray did not show pneumonia, UA does not suggest UTI.  Procalcitonin level less than 0.1.  Peers that the patient had a viral syndrome.  COVID and influenza was negative. I will discontinue antibiotics.  Cultures so far negative.  Class  II obesity with BMI 35.22. This complicates overall care and prognosis.    Type 2 diabetes mellitus with hyperglycemia, without long-term current use of insulin  (HCC) Continue sliding scale insulin .  Gastroesophageal reflux disease without esophagitis Home PPI resumed  GAD (generalized anxiety disorder) Resume home treatment.       Subjective:  Patient feels tired with some palpitation.  No short of breath today.  Physical Exam: Vitals:   11/06/23 1230 11/06/23 1242 11/06/23 1500 11/06/23 1634  BP: 117/84  (!) 134/93   Pulse: (!) 117  (!) 101   Resp: (!) 27  (!) 22   Temp:  99 F (37.2 C)  98.1 F (36.7 C)  TempSrc:  Oral  Oral  SpO2: 95%  96%   Weight:      Height:       General exam: Appears calm and comfortable  Respiratory system: Clear to auscultation. Respiratory effort normal. Cardiovascular system: Irregular and tachycardic. No JVD, murmurs, rubs, gallops or clicks. No pedal edema. Gastrointestinal system: Abdomen is nondistended, soft and nontender. No organomegaly or masses felt. Normal bowel sounds heard. Central nervous system: Alert and oriented. No focal neurological deficits. Extremities: Symmetric 5 x 5 power. Skin: No rashes, lesions or ulcers Psychiatry: Judgement and insight appear normal. Mood & affect appropriate.    Data Reviewed:  The telemetry, EKG, lab results.  Family Communication: None  Disposition: Status is: Inpatient Remains inpatient appropriate because: Severity of disease.     Time spent: 35 minutes  Author: Murvin Mana, MD 11/06/2023 4:59 PM  For on call review [email or web address].

## 2023-11-07 ENCOUNTER — Inpatient Hospital Stay

## 2023-11-07 ENCOUNTER — Ambulatory Visit (HOSPITAL_COMMUNITY): Admitting: Physician Assistant

## 2023-11-07 DIAGNOSIS — F411 Generalized anxiety disorder: Secondary | ICD-10-CM

## 2023-11-07 DIAGNOSIS — I4891 Unspecified atrial fibrillation: Secondary | ICD-10-CM | POA: Diagnosis not present

## 2023-11-07 DIAGNOSIS — E1165 Type 2 diabetes mellitus with hyperglycemia: Secondary | ICD-10-CM | POA: Diagnosis not present

## 2023-11-07 DIAGNOSIS — I4819 Other persistent atrial fibrillation: Secondary | ICD-10-CM | POA: Diagnosis not present

## 2023-11-07 DIAGNOSIS — F3341 Major depressive disorder, recurrent, in partial remission: Secondary | ICD-10-CM

## 2023-11-07 DIAGNOSIS — R6510 Systemic inflammatory response syndrome (SIRS) of non-infectious origin without acute organ dysfunction: Secondary | ICD-10-CM | POA: Diagnosis not present

## 2023-11-07 LAB — DIGOXIN LEVEL: Digoxin Level: 0.7 ng/mL — ABNORMAL LOW (ref 0.8–2.0)

## 2023-11-07 LAB — BASIC METABOLIC PANEL WITH GFR
Anion gap: 11 (ref 5–15)
BUN: 8 mg/dL (ref 8–23)
CO2: 24 mmol/L (ref 22–32)
Calcium: 9.5 mg/dL (ref 8.9–10.3)
Chloride: 104 mmol/L (ref 98–111)
Creatinine, Ser: 0.41 mg/dL — ABNORMAL LOW (ref 0.44–1.00)
GFR, Estimated: >60 mL/min (ref >60)
Glucose, Bld: 167 mg/dL — ABNORMAL HIGH (ref 70–99)
Potassium: 4.1 mmol/L (ref 3.5–5.1)
Sodium: 139 mmol/L (ref 135–145)

## 2023-11-07 LAB — CBG MONITORING, ED
Glucose-Capillary: 169 mg/dL — ABNORMAL HIGH (ref 70–99)
Glucose-Capillary: 146 mg/dL — ABNORMAL HIGH (ref 70–99)

## 2023-11-07 LAB — MAGNESIUM: Magnesium: 2.2 mg/dL (ref 1.7–2.4)

## 2023-11-07 NOTE — H&P (View-Only) (Signed)
  Progress Note  Patient Name: Lisa Gutierrez Date of Encounter: 11/07/2023 [facility] Cardiologist: Deatrice Cage, MD   Interval Summary    She remains in Afib with improved rates in the 90s. Patient is feeling better this AM.   Vital Signs Vitals:   11/07/23 0000 11/07/23 0200 11/07/23 0600 11/07/23 0605  BP: 119/70 113/67 118/68   Pulse:  99 73   Resp: (!) 24 (!) 25 (!) 21   Temp:  98.9 F (37.2 C)  98.6 F (37 C)  TempSrc:  Oral  Oral  SpO2: 90% 90% 93%   Weight:      Height:        Intake/Output Summary (Last 24 hours) at 11/07/2023 0756 Last data filed at 11/06/2023 1534 Gross per 24 hour  Intake 50 ml  Output --  Net 50 ml      11/05/2023    7:52 PM 10/29/2023    5:45 PM 10/29/2023    9:44 AM  Last 3 Weights  Weight (lbs) 211 lb 10.3 oz 210 lb 11.2 oz 207 lb 9.6 oz  Weight (kg) 96 kg 95.573 kg 94.167 kg      Telemetry/ECG  Afib HR 90s - Personally Reviewed  Physical Exam  GEN: No acute distress.   Neck: No JVD Cardiac: Irreg Irreg, no murmurs, rubs, or gallops.  Respiratory: Clear to auscultation bilaterally. GI: Soft, nontender, non-distended  MS: No edema  Assessment & Plan   Persistent Afib - Afib RVR in the setting of viral infection - EP saw and recommend to wait on DCCV and allow illness to pass with possible conversion to NSR - HR improved to 90s - she has Afib clinic follow-up next week to determine if she requires DCCV - continue Eliquis , Tikosyn , metoprolol , digoxin  - dig level 0.7 - Qtc , which is stable  Viral illness/SIRS - per IM - abx stopped     For questions or updates, please contact [facility] Please consult [email or web address] for contact info under       Signed, Madina Galati VEAR Fishman, PA-C

## 2023-11-07 NOTE — ED Notes (Signed)
 EKG performed and signed off by ED MD. QTc interval- 420.

## 2023-11-07 NOTE — Progress Notes (Deleted)
 Established Patient Office Visit  Subjective   Patient ID: Lisa Gutierrez, female    DOB: [birth year]  Age: 73 y.o. MRN: [id number]  No chief complaint on file.   HPI  Lisa Gutierrez is a 73 y.o. y/o female who presents to the clinic today for hospital follow up.   Patient was seen and discharged from the hospital on 11/01/2023 with diagnosis of atrial fibrillation with RVR.  At that time, Tikosyn  was administered and patient underwent direct cardioversion which restored normal sinus rhythm.  She was monitored on telemetry for 24 hours before discharge, on Eliquis , and instructed to undergo close follow-up with atrial fibrillation clinic afterward. Patient was then seen again in the ED on 11/05/2023 for complaints of fever, cough/congestion and racing heartbeat.  Workup in the ED revealed that patient was back in atrial fibrillation.  Blood culture negative, procalcitonin less than 0.1, urinalysis unremarkable, chest x-ray also unremarkable, COVID/flu swab negative. However, patient did meet SIRS criteria, so she was admitted for 2 days with IV fluids and discharged on 11/07/2023.  Did not require antibiotics during this hospital stay.  During her second hospital course, it was discovered that she was again in atrial fibrillation with RVR.  Tikosyn  was restarted and patient was also administered with her regular beta-blocker and Eliquis  during her stay.  Once stabilized for discharge, it was recommended that she follow-up with PCP in 1 week and follow with outpatient cardiology/atrial fibrillation clinic for outpatient cardioversion. Today she reports ***  {History (Optional):23778}  ROS Per HPI.    Objective:     There were no vitals taken for this visit. {Vitals History (Optional):23777}  Physical Exam   Results for orders placed or performed during the hospital encounter of 11/05/23  Resp panel by RT-PCR (RSV, Flu A&B, Covid) Anterior Nasal Swab   Specimen: Anterior  Nasal Swab  Result Value Ref Range   SARS Coronavirus 2 by RT PCR NEGATIVE NEGATIVE   Influenza A by PCR NEGATIVE NEGATIVE   Influenza B by PCR NEGATIVE NEGATIVE   Resp Syncytial Virus by PCR NEGATIVE NEGATIVE  Blood Culture (routine x 2)   Specimen: BLOOD  Result Value Ref Range   Specimen Description BLOOD BLOOD RIGHT FOREARM    Special Requests      BOTTLES DRAWN AEROBIC AND ANAEROBIC Blood Culture adequate volume   Culture      NO GROWTH 2 DAYS Performed at [facility], [address] [place], [place] [address]    Report Status PENDING   Blood Culture (routine x 2)   Specimen: BLOOD  Result Value Ref Range   Specimen Description BLOOD BLOOD LEFT FOREARM    Special Requests      BOTTLES DRAWN AEROBIC AND ANAEROBIC Blood Culture adequate volume   Culture      NO GROWTH 2 DAYS Performed at [facility], [address] [place], [place] [address]    Report Status PENDING   MRSA Next Gen by PCR, Nasal   Specimen: Nasal Mucosa; Nasal Swab  Result Value Ref Range   MRSA by PCR Next Gen NOT DETECTED NOT DETECTED  Lactic acid, plasma  Result Value Ref Range   Lactic Acid, Venous 3.5 (HH) 0.5 - 1.9 mmol/L  Lactic acid, plasma  Result Value Ref Range   Lactic Acid, Venous 3.0 (HH) 0.5 - 1.9 mmol/L  Comprehensive metabolic panel  Result Value Ref Range   Sodium 136 135 - 145 mmol/L   Potassium 3.9 3.5 - 5.1  mmol/L   Chloride 102 98 - 111 mmol/L   CO2 21 (L) 22 - 32 mmol/L   Glucose, Bld 168 (H) 70 - 99 mg/dL   BUN 13 8 - 23 mg/dL   Creatinine, Ser 9.43 0.44 - 1.00 mg/dL   Calcium  10.4 (H) 8.9 - 10.3 mg/dL   Total Protein 7.6 6.5 - 8.1 g/dL   Albumin 3.6 3.5 - 5.0 g/dL   AST 45 (H) 15 - 41 U/L   ALT 33 0 - 44 U/L   Alkaline Phosphatase 54 38 - 126 U/L   Total Bilirubin 0.5 0.0 - 1.2 mg/dL   GFR, Estimated >39 >39 mL/min   Anion gap 13 5 - 15  CBC with Differential  Result Value Ref Range   WBC 9.5 4.0 - 10.5 K/uL   RBC 4.65 3.87 - 5.11 MIL/uL    Hemoglobin 13.8 12.0 - 15.0 g/dL   HCT 57.9 63.9 - 53.9 %   MCV 90.3 80.0 - 100.0 fL   MCH 29.7 26.0 - 34.0 pg   MCHC 32.9 30.0 - 36.0 g/dL   RDW 85.8 88.4 - 84.4 %   Platelets 240 150 - 400 K/uL   nRBC 0.0 0.0 - 0.2 %   Neutrophils Relative % 65 %   Neutro Abs 6.2 1.7 - 7.7 K/uL   Lymphocytes Relative 21 %   Lymphs Abs 2.0 0.7 - 4.0 K/uL   Monocytes Relative 11 %   Monocytes Absolute 1.1 (H) 0.1 - 1.0 K/uL   Eosinophils Relative 1 %   Eosinophils Absolute 0.1 0.0 - 0.5 K/uL   Basophils Relative 1 %   Basophils Absolute 0.1 0.0 - 0.1 K/uL   Immature Granulocytes 1 %   Abs Immature Granulocytes 0.10 (H) 0.00 - 0.07 K/uL  Protime-INR  Result Value Ref Range   Prothrombin Time 14.4 11.4 - 15.2 seconds   INR 1.1 0.8 - 1.2  Urinalysis, w/ Reflex to Culture (Infection Suspected) -Urine, Clean Catch  Result Value Ref Range   Specimen Source URINE, CLEAN CATCH    Color, Urine STRAW (A) YELLOW   APPearance CLEAR (A) CLEAR   Specific Gravity, Urine 1.009 1.005 - 1.030   pH 6.0 5.0 - 8.0   Glucose, UA >=500 (A) NEGATIVE mg/dL   Hgb urine dipstick NEGATIVE NEGATIVE   Bilirubin Urine NEGATIVE NEGATIVE   Ketones, ur NEGATIVE NEGATIVE mg/dL   Protein, ur NEGATIVE NEGATIVE mg/dL   Nitrite NEGATIVE NEGATIVE   Leukocytes,Ua NEGATIVE NEGATIVE   RBC / HPF 0-5 0 - 5 RBC/hpf   WBC, UA 0-5 0 - 5 WBC/hpf   Bacteria, UA NONE SEEN NONE SEEN   Squamous Epithelial / HPF 0-5 0 - 5 /HPF  Digoxin  level  Result Value Ref Range   Digoxin  Level 0.4 (L) 0.8 - 2.0 ng/mL  Magnesium   Result Value Ref Range   Magnesium  2.1 1.7 - 2.4 mg/dL  Basic metabolic panel  Result Value Ref Range   Sodium 137 135 - 145 mmol/L   Potassium 3.7 3.5 - 5.1 mmol/L   Chloride 103 98 - 111 mmol/L   CO2 20 (L) 22 - 32 mmol/L   Glucose, Bld 186 (H) 70 - 99 mg/dL   BUN 10 8 - 23 mg/dL   Creatinine, Ser 9.40 0.44 - 1.00 mg/dL   Calcium  9.4 8.9 - 10.3 mg/dL   GFR, Estimated >39 >39 mL/min   Anion gap 14 5 - 15  CBC   Result Value Ref Range   WBC  9.4 4.0 - 10.5 K/uL   RBC 4.30 3.87 - 5.11 MIL/uL   Hemoglobin 13.0 12.0 - 15.0 g/dL   HCT 60.7 63.9 - 53.9 %   MCV 91.2 80.0 - 100.0 fL   MCH 30.2 26.0 - 34.0 pg   MCHC 33.2 30.0 - 36.0 g/dL   RDW 85.9 88.4 - 84.4 %   Platelets 228 150 - 400 K/uL   nRBC 0.0 0.0 - 0.2 %  Hemoglobin A1c  Result Value Ref Range   Hgb A1c MFr Bld 7.6 (H) 4.8 - 5.6 %   Mean Plasma Glucose 171.42 mg/dL  Procalcitonin  Result Value Ref Range   Procalcitonin <0.10 ng/mL  Magnesium   Result Value Ref Range   Magnesium  1.9 1.7 - 2.4 mg/dL  Digoxin  level  Result Value Ref Range   Digoxin  Level 0.7 (L) 0.8 - 2.0 ng/mL  Basic metabolic panel with GFR  Result Value Ref Range   Sodium 139 135 - 145 mmol/L   Potassium 4.1 3.5 - 5.1 mmol/L   Chloride 104 98 - 111 mmol/L   CO2 24 22 - 32 mmol/L   Glucose, Bld 167 (H) 70 - 99 mg/dL   BUN 8 8 - 23 mg/dL   Creatinine, Ser 9.58 (L) 0.44 - 1.00 mg/dL   Calcium  9.5 8.9 - 10.3 mg/dL   GFR, Estimated >39 >39 mL/min   Anion gap 11 5 - 15  Magnesium   Result Value Ref Range   Magnesium  2.2 1.7 - 2.4 mg/dL  CBG monitoring, ED  Result Value Ref Range   Glucose-Capillary 176 (H) 70 - 99 mg/dL  CBG monitoring, ED  Result Value Ref Range   Glucose-Capillary 143 (H) 70 - 99 mg/dL   Comment 1 Notify RN   CBG monitoring, ED  Result Value Ref Range   Glucose-Capillary 150 (H) 70 - 99 mg/dL   Comment 1 Notify RN    Comment 2 Document in Chart   CBG monitoring, ED  Result Value Ref Range   Glucose-Capillary 159 (H) 70 - 99 mg/dL  CBG monitoring, ED  Result Value Ref Range   Glucose-Capillary 234 (H) 70 - 99 mg/dL  CBG monitoring, ED  Result Value Ref Range   Glucose-Capillary 169 (H) 70 - 99 mg/dL  CBG monitoring, ED  Result Value Ref Range   Glucose-Capillary 146 (H) 70 - 99 mg/dL  Troponin I (High Sensitivity)  Result Value Ref Range   Troponin I (High Sensitivity) 8 <18 ng/L  Troponin I (High Sensitivity)  Result Value Ref  Range   Troponin I (High Sensitivity) 7 <18 ng/L    {Labs (Optional):23779}  The 10-year ASCVD risk score (Arnett DK, et al., 2019) is: 27.4%    Assessment & Plan:   There are no diagnoses linked to this encounter.  No follow-ups on file.    Saddie JULIANNA Sacks, PA-C

## 2023-11-07 NOTE — Progress Notes (Signed)
  Progress Note  Patient Name: Lisa Gutierrez Date of Encounter: 11/07/2023 [facility] Cardiologist: Deatrice Cage, MD   Interval Summary    She remains in Afib with improved rates in the 90s. Patient is feeling better this AM.   Vital Signs Vitals:   11/07/23 0000 11/07/23 0200 11/07/23 0600 11/07/23 0605  BP: 119/70 113/67 118/68   Pulse:  99 73   Resp: (!) 24 (!) 25 (!) 21   Temp:  98.9 F (37.2 C)  98.6 F (37 C)  TempSrc:  Oral  Oral  SpO2: 90% 90% 93%   Weight:      Height:        Intake/Output Summary (Last 24 hours) at 11/07/2023 0756 Last data filed at 11/06/2023 1534 Gross per 24 hour  Intake 50 ml  Output --  Net 50 ml      11/05/2023    7:52 PM 10/29/2023    5:45 PM 10/29/2023    9:44 AM  Last 3 Weights  Weight (lbs) 211 lb 10.3 oz 210 lb 11.2 oz 207 lb 9.6 oz  Weight (kg) 96 kg 95.573 kg 94.167 kg      Telemetry/ECG  Afib HR 90s - Personally Reviewed  Physical Exam  GEN: No acute distress.   Neck: No JVD Cardiac: Irreg Irreg, no murmurs, rubs, or gallops.  Respiratory: Clear to auscultation bilaterally. GI: Soft, nontender, non-distended  MS: No edema  Assessment & Plan   Persistent Afib - Afib RVR in the setting of viral infection - EP saw and recommend to wait on DCCV and allow illness to pass with possible conversion to NSR - HR improved to 90s - she has Afib clinic follow-up next week to determine if she requires DCCV - continue Eliquis , Tikosyn , metoprolol , digoxin  - dig level 0.7 - Qtc , which is stable  Viral illness/SIRS - per IM - abx stopped     For questions or updates, please contact [facility] Please consult [email or web address] for contact info under       Signed, Madina Galati VEAR Fishman, PA-C

## 2023-11-07 NOTE — Discharge Summary (Addendum)
 Physician Discharge Summary   Patient: Lisa Gutierrez MRN: [id number] DOB: 1950/05/16  Admit date:     11/05/2023  Discharge date: 11/07/23  Discharge Physician: Murvin Mana   PCP: Pcp, No   Recommendations at discharge:   Patient is advised to follow-up with PCP in 1 week. Cardiology will arrange follow-up for outpatient cardioversion.  Follow-up with cardiology in 1 week.  Discharge Diagnoses: Principal Problem:   Atrial fibrillation with RVR (HCC) Active Problems:   SIRS (systemic inflammatory response syndrome) (HCC)   Morbid obesity (HCC)   GAD (generalized anxiety disorder)   Gastroesophageal reflux disease without esophagitis   Type 2 diabetes mellitus with hyperglycemia, without long-term current use of insulin  (HCC)   MDD (major depressive disorder), recurrent, in partial remission (HCC)   Statin intolerance   Hyperlipidemia associated with type 2 diabetes mellitus (HCC)   Sepsis without acute organ dysfunction (HCC)   Persistent atrial fibrillation (HCC)   Lactic acid increased  Resolved Problems:   * No resolved hospital problems. [facility]: Lisa Gutierrez is a 73 year old female with history of GERD, IBS, fatty liver, chronic gastritis, insomnia, atrial fibrillation status post DCCV x 2 and Tikosyn  on 10/29/2023, hypertension, morbid obesity, who presents emergency department for chief concerns of dizziness, and falling today. She is found to have atrial fibrillation with RVR.  Cardiology consult obtained. Patient is seen by cardiology, restarted Tikosyn , beta-blocker as well as Eliquis . Patient atrial fibrillation was triggered by recent upper resp infection.  Cardiology follow-up as outpatient for possible cardioversion; cleared patient for discharge.  Cardiology will arrange.  Assessment and Plan: *Persistent atrial fibrillation with RVR (HCC) Patient is followed by cardiology, he recently was started on Tikosyn , had a cardioversion about a  week ago.  Came back again with atrial fibrillation.  Cardiology consult obtained, patient was restarted on Tikosyn , beta-blocker and digoxin , also on Eliquis .   Discussed with Dr. Gollan, patient wished to go home today.  Cardiology recommended follow-up with atrial fibrillation clinic for outpatient cardioversion if not covered.  Currently, he is not a good time for cardioversion due to concurrent upper respiratory infection.   SIRS (systemic inflammatory response syndrome) (HCC) Upper respiratory symptoms. Recently had a fever, upper resp symptoms, some loose stools.  However, chest x-ray did not show pneumonia, UA does not suggest UTI.  Procalcitonin level less than 0.1.  It appears that the patient had a viral syndrome.  COVID and influenza was negative. Blood culture negative, no need for antibiotics.   Class II obesity with BMI 35.22. This complicates overall care and prognosis.      Type 2 diabetes mellitus with hyperglycemia, without long-term current use of insulin  (HCC) Resume home treatment.   Gastroesophageal reflux disease without esophagitis Home PPI resumed   GAD (generalized anxiety disorder) with depression. Resume home treatment. May consider changing to Paxil if it has not been tried before.  It appears to have less effect on QT interval.  Will let PCP to decide on it.             Consultants: Cardiology Procedures performed: None  Disposition: Home Diet recommendation:  Discharge Diet Orders (From admission, onward)     Start     Ordered   11/07/23 0000  Diet - low sodium heart healthy        11/07/23 1047           Cardiac diet DISCHARGE MEDICATION: Allergies as of 11/07/2023       Reactions  Aspirin Other (See Comments)   GI upset   Ibuprofen Other (See Comments)   GERD   Levofloxacin Other (See Comments)   Weight gain   Meloxicam Other (See Comments)   GI upset   Naprosyn  [naproxen] Other (See Comments)   GI upset   Nsaids Other (See  Comments)   GI upset   Ozempic  (0.25 Or 0.5 Mg-dose) [semaglutide (0.25 Or 0.5mg -dos)] Nausea And Vomiting   Propofol  Other (See Comments)   Had horrible nightmares   Quinolones    Robinul  [glycopyrrolate] Nausea And Vomiting   Morphine Swelling, Rash, Other (See Comments)   Penicillin V Potassium Rash   Sulfa Antibiotics Rash        Medication List     TAKE these medications    acetaminophen  500 MG tablet Commonly known as: TYLENOL  Take 1,000 mg by mouth every 8 (eight) hours as needed for mild pain (pain score 1-3), moderate pain (pain score 4-6) or headache.   ALPRAZolam  0.5 MG tablet Commonly known as: XANAX  TAKE 1 TABLET (0.5 MG TOTAL) BY MOUTH AT BEDTIME AS NEEDED.   apixaban  5 MG Tabs tablet Commonly known as: Eliquis  Take 1 tablet (5 mg total) by mouth 2 (two) times daily.   busPIRone  5 MG tablet Commonly known as: BUSPAR  Take 5 mg by mouth 2 (two) times daily.   Calcium  600/Vitamin D3 600-20 MG-MCG Tabs Generic drug: Calcium  Carb-Cholecalciferol  Take 1 tablet by mouth 2 (two) times daily.   clobetasol  ointment 0.05 % Commonly known as: TEMOVATE  Apply topically 2 (two) times daily. Apply topically to affected twice daily until improved. What changed:  how much to take when to take this reasons to take this additional instructions   digoxin  0.25 MG tablet Commonly known as: LANOXIN  TAKE 1 TABLET BY MOUTH EVERY DAY   dofetilide  500 MCG capsule Commonly known as: TIKOSYN  Take 1 capsule (500 mcg total) by mouth 2 (two) times daily.   empagliflozin  25 MG Tabs tablet Commonly known as: JARDIANCE  Take 1 tablet (25 mg total) by mouth daily before breakfast.   Flaxseed Oil 1400 MG Caps Take 1,400 mg by mouth 2 (two) times daily.   furosemide  20 MG tablet Commonly known as: LASIX  TAKE 1 TABLET BY MOUTH EVERY DAY What changed:  when to take this reasons to take this   glucose blood test strip Use as instructed   BLOOD GLUCOSE TEST STRIPS Strp 1  each by In Vitro route in the morning, at noon, and at bedtime. May substitute to any manufacturer covered by patient's insurance.   linaclotide  290 MCG Caps capsule Commonly known as: LINZESS  Take 1 capsule (290 mcg total) by mouth daily before breakfast.   losartan  25 MG tablet Commonly known as: COZAAR  Take 1 tablet (25 mg total) by mouth daily.   magnesium  oxide 400 MG tablet Commonly known as: MAG-OX Take 1 tablet (400 mg total) by mouth daily.   metFORMIN  500 MG 24 hr tablet Commonly known as: GLUCOPHAGE -XR TAKE 1 TABLET BY MOUTH TWICE A DAY WITH FOOD   metoprolol  tartrate 100 MG tablet Commonly known as: LOPRESSOR  Take 1 tablet (100 mg total) by mouth 2 (two) times daily.   MULTIVITAMIN ADULT PO Take 1 tablet by mouth daily. One a day   ONE TOUCH ULTRA 2 w/Device Kit CHECK IN THE MORNING, AT NOON, AND AT BEDTIME. MAY SUBSTITUTE TO ANY MANUFACTURER COVERED BY PATIENT'S INSURANCE.   pantoprazole  40 MG tablet Commonly known as: PROTONIX  TAKE 1 TABLET BY MOUTH TWICE A  DAY   potassium chloride  SA 20 MEQ tablet Commonly known as: KLOR-CON  M Take 1 tablet (20 mEq total) by mouth daily.   Rybelsus  14 MG Tabs Generic drug: Semaglutide  Take 1 tablet by mouth every morning.   STOOL SOFTENER PO Take 2 tablets by mouth at bedtime.   venlafaxine  XR 150 MG 24 hr capsule Commonly known as: EFFEXOR -XR Take 1 capsule (150 mg total) by mouth daily with breakfast. Take total of 187.5 mg daily. Take along with 37.5 mg cap   venlafaxine  XR 37.5 MG 24 hr capsule Commonly known as: EFFEXOR -XR Take 1 capsule (37.5 mg total) by mouth daily with breakfast. Take total of 187.5 mg daily. Take along with 150 mg cap   Vitamin D3 50 MCG (2000 UT) capsule Take 2,000 Units by mouth daily.        Discharge Exam: Filed Weights   11/05/23 1952  Weight: 96 kg   General exam: Appears calm and comfortable  Respiratory system: Clear to auscultation. Respiratory effort  normal. Cardiovascular system: Irregular. No JVD, murmurs, rubs, gallops or clicks. No pedal edema. Gastrointestinal system: Abdomen is nondistended, soft and nontender. No organomegaly or masses felt. Normal bowel sounds heard. Central nervous system: Alert and oriented. No focal neurological deficits. Extremities: Symmetric 5 x 5 power. Skin: No rashes, lesions or ulcers Psychiatry: Judgement and insight appear normal. Mood & affect appropriate.    Condition at discharge: good  The results of significant diagnostics from this hospitalization (including imaging, microbiology, ancillary and laboratory) are listed below for reference.   Imaging Studies: DG Chest Port 1 View Result Date: 11/05/2023 CLINICAL DATA:  Questionable sepsis - evaluate for abnormality EXAM: PORTABLE CHEST - 1 VIEW COMPARISON:  05/09/2023. FINDINGS: Cardiac silhouette enlarged. No evidence of pneumothorax or pleural effusion. No evidence of pulmonary edema. Aorta is calcified. No osseous abnormalities identified. IMPRESSION: Enlarged cardiac silhouette.  Lungs are clear. Electronically Signed   By: Fonda Field M.D.   On: 11/05/2023 20:44   EP STUDY Result Date: 10/31/2023 See surgical note for result.   Microbiology: Results for orders placed or performed during the hospital encounter of 11/05/23  Resp panel by RT-PCR (RSV, Flu A&B, Covid) Anterior Nasal Swab     Status: None   Collection Time: 11/05/23  8:15 PM   Specimen: Anterior Nasal Swab  Result Value Ref Range Status   SARS Coronavirus 2 by RT PCR NEGATIVE NEGATIVE Final    Comment: (NOTE) SARS-CoV-2 target nucleic acids are NOT DETECTED.  The SARS-CoV-2 RNA is generally detectable in upper respiratory specimens during the acute phase of infection. The lowest concentration of SARS-CoV-2 viral copies this assay can detect is 138 copies/mL. A negative result does not preclude SARS-Cov-2 infection and should not be used as the sole basis for  treatment or other patient management decisions. A negative result may occur with  improper specimen collection/handling, submission of specimen other than nasopharyngeal swab, presence of viral mutation(s) within the areas targeted by this assay, and inadequate number of viral copies(<138 copies/mL). A negative result must be combined with clinical observations, patient history, and epidemiological information. The expected result is Negative.  Fact Sheet for Patients:  BloggerCourse.com  Fact Sheet for Healthcare Providers:  SeriousBroker.it  This test is no t yet approved or cleared by the [place]  FDA and  has been authorized for detection and/or diagnosis of SARS-CoV-2 by FDA under an Emergency Use Authorization (EUA). This EUA will remain  in effect (meaning this test can be used) for  the duration of the COVID-19 declaration under Section 564(b)(1) of the Act, 21 U.S.C.section 360bbb-3(b)(1), unless the authorization is terminated  or revoked sooner.       Influenza A by PCR NEGATIVE NEGATIVE Final   Influenza B by PCR NEGATIVE NEGATIVE Final    Comment: (NOTE) The Xpert Xpress SARS-CoV-2/FLU/RSV plus assay is intended as an aid in the diagnosis of influenza from Nasopharyngeal swab specimens and should not be used as a sole basis for treatment. Nasal washings and aspirates are unacceptable for Xpert Xpress SARS-CoV-2/FLU/RSV testing.  Fact Sheet for Patients: BloggerCourse.com  Fact Sheet for Healthcare Providers: SeriousBroker.it  This test is not yet approved or cleared by the [place]  FDA and has been authorized for detection and/or diagnosis of SARS-CoV-2 by FDA under an Emergency Use Authorization (EUA). This EUA will remain in effect (meaning this test can be used) for the duration of the COVID-19 declaration under Section 564(b)(1) of the Act, 21  U.S.C. section 360bbb-3(b)(1), unless the authorization is terminated or revoked.     Resp Syncytial Virus by PCR NEGATIVE NEGATIVE Final    Comment: (NOTE) Fact Sheet for Patients: BloggerCourse.com  Fact Sheet for Healthcare Providers: SeriousBroker.it  This test is not yet approved or cleared by the [place]  FDA and has been authorized for detection and/or diagnosis of SARS-CoV-2 by FDA under an Emergency Use Authorization (EUA). This EUA will remain in effect (meaning this test can be used) for the duration of the COVID-19 declaration under Section 564(b)(1) of the Act, 21 U.S.C. section 360bbb-3(b)(1), unless the authorization is terminated or revoked.  Performed at [facility], [address] [place], [place] [address]   Blood Culture (routine x 2)     Status: None (Preliminary result)   Collection Time: 11/05/23  8:19 PM   Specimen: BLOOD  Result Value Ref Range Status   Specimen Description BLOOD BLOOD RIGHT FOREARM  Final   Special Requests   Final    BOTTLES DRAWN AEROBIC AND ANAEROBIC Blood Culture adequate volume   Culture   Final    NO GROWTH 2 DAYS Performed at [facility], [address], [place], [place] [address]    Report Status PENDING  Incomplete  Blood Culture (routine x 2)     Status: None (Preliminary result)   Collection Time: 11/05/23  8:19 PM   Specimen: BLOOD  Result Value Ref Range Status   Specimen Description BLOOD BLOOD LEFT FOREARM  Final   Special Requests   Final    BOTTLES DRAWN AEROBIC AND ANAEROBIC Blood Culture adequate volume   Culture   Final    NO GROWTH 2 DAYS Performed at [facility], [address], [place], [place] [address]    Report Status PENDING  Incomplete  MRSA Next Gen by PCR, Nasal     Status: None   Collection Time: 11/06/23  1:38 AM   Specimen: Nasal Mucosa; Nasal Swab  Result Value Ref Range Status   MRSA by PCR Next Gen  NOT DETECTED NOT DETECTED Final    Comment: (NOTE) The GeneXpert MRSA Assay (FDA approved for NASAL specimens only), is one component of a comprehensive MRSA colonization surveillance program. It is not intended to diagnose MRSA infection nor to guide or monitor treatment for MRSA infections. Test performance is not FDA approved in patients less than 14 years old. Performed at [facility], [address] [place], [place] [address]     Labs: CBC: Recent Labs  Lab 11/05/23 2015  11/06/23 0232  WBC 9.5 9.4  NEUTROABS 6.2  --   HGB 13.8 13.0  HCT 42.0 39.2  MCV 90.3 91.2  PLT 240 228   Basic Metabolic Panel: Recent Labs  Lab 11/01/23 0317 11/05/23 2015 11/06/23 0232 11/07/23 0613  NA 137 136 137 139  K 3.9 3.9 3.7 4.1  CL 101 102 103 104  CO2 25 21* 20* 24  GLUCOSE 176* 168* 186* 167*  BUN 9 13 10 8   CREATININE 0.64 0.56 0.59 0.41*  CALCIUM  9.6 10.4* 9.4 9.5  MG 2.0 2.1 1.9 2.2   Liver Function Tests: Recent Labs  Lab 11/05/23 2015  AST 45*  ALT 33  ALKPHOS 54  BILITOT 0.5  PROT 7.6  ALBUMIN 3.6   CBG: Recent Labs  Lab 11/06/23 0748 11/06/23 1124 11/06/23 1633 11/06/23 2112 11/07/23 0750  GLUCAP 143* 150* 159* 234* 169*    Discharge time spent: greater than 30 minutes.  Signed: Murvin Mana, MD [facility] 11/07/2023

## 2023-11-07 NOTE — Progress Notes (Signed)
 Pharmacy: Dofetilide  (Tikosyn ) - Follow Up Assessment and Electrolyte Replacement  73 y.o. female admitted on 11/05/2023 for syncope. Patient's home tikosyn  has been resumed on admission and pharmacy has been consulted to to assist in monitoring and replacing electrolytes.  Labs:    Component Value Date/Time   K 4.1 11/07/2023 0613   K 3.5 08/03/2013 1542   MG 2.2 11/07/2023 9386     Plan: Potassium: K = 4.1, no replacement indicated at this time  Magnesium : Mg = 2.2, no replacement indicated at this time   Follow-up labs tomorrow AM.   Thank you for involving pharmacy in this patient's care.   Kayla JULIANNA Blew, PharmD Clinical Pharmacist 11/07/2023 7:05 AM

## 2023-11-10 LAB — CULTURE, BLOOD (ROUTINE X 2)
Culture: NO GROWTH
Culture: NO GROWTH
Special Requests: ADEQUATE
Special Requests: ADEQUATE

## 2023-11-11 ENCOUNTER — Other Ambulatory Visit: Payer: Self-pay

## 2023-11-11 ENCOUNTER — Encounter (HOSPITAL_COMMUNITY): Payer: Self-pay | Admitting: Physician Assistant

## 2023-11-11 ENCOUNTER — Ambulatory Visit (HOSPITAL_COMMUNITY)
Admit: 2023-11-11 | Discharge: 2023-11-11 | Disposition: A | Discharge status: Home / Self Care | Attending: Physician Assistant | Admitting: Physician Assistant

## 2023-11-11 ENCOUNTER — Encounter (HOSPITAL_COMMUNITY): Payer: Self-pay

## 2023-11-11 VITALS — BP 116/80 | HR 105 | Ht 65.0 in | Wt 205.2 lb

## 2023-11-11 DIAGNOSIS — I4819 Other persistent atrial fibrillation: Secondary | ICD-10-CM | POA: Diagnosis not present

## 2023-11-11 DIAGNOSIS — D6869 Other thrombophilia: Secondary | ICD-10-CM | POA: Diagnosis not present

## 2023-11-11 DIAGNOSIS — Z5181 Encounter for therapeutic drug level monitoring: Secondary | ICD-10-CM

## 2023-11-11 DIAGNOSIS — Z79899 Other long term (current) drug therapy: Secondary | ICD-10-CM

## 2023-11-11 NOTE — Progress Notes (Signed)
 Primary Care Physician: Gayle Saddie FALCON, PA-C Primary Cardiologist: Deatrice Cage, MD Electrophysiologist: [name], MD  Referring Physician: Dr HOLTS [place] Lisa Gutierrez is a 73 y.o. female with a history of HTN, DM, atrial fibrillation who presents for follow up in the [facility].  The patient is s/p afib ablation 05/02/23. She had afib post ablation and underwent DCCV 06/03/23. However, she was seen by Dr HOLTS and was back in afib again, dofetilide  recommended. Patient is on Eliquis  for stroke prevention. She is s/p dofetilide  loading 7/15-7/18/25 with DCCV on 10/31/23. She presented back to the ED 11/05/23 with fever, weakness, and diarrhea. She was also back in afib at that time. DCCV was deferred to allow time to recover from her acute illness.    Patient returns for follow up for atrial fibrillation and dofetilide  monitoring. She remains in afib with symptoms of fatigue and SOB. No bleeding issues on anticoagulation.   Today, she  denies symptoms of palpitations, chest pain, orthopnea, PND, lower extremity edema, dizziness, presyncope, syncope, snoring, daytime somnolence, bleeding, or neurologic sequela. The patient is tolerating medications without difficulties and is otherwise without complaint today.    Atrial Fibrillation Risk Factors:  she does not have symptoms or diagnosis of sleep apnea. she does not have a history of rheumatic fever.   Atrial Fibrillation Management history:  Previous antiarrhythmic drugs: dofetilide   Previous cardioversions: 09/03/22, 06/03/23, 10/31/23 Previous ablations: 05/02/23 Anticoagulation history: Eliquis   ROS- All systems are reviewed and negative except as per the HPI above.  Past Medical History:  Diagnosis Date   Anxiety    Atrial fibrillation (HCC)    Depression    Depression    Phreesia 07/02/2020   Diabetes mellitus without complication (HCC)    GERD (gastroesophageal reflux disease)     Hypertension    IBS (irritable bowel syndrome)    Neuromuscular disorder (HCC)     Current Outpatient Medications  Medication Sig Dispense Refill   acetaminophen  (TYLENOL ) 500 MG tablet Take 1,000 mg by mouth every 8 (eight) hours as needed for mild pain (pain score 1-3), moderate pain (pain score 4-6) or headache.     ALPRAZolam  (XANAX ) 0.5 MG tablet TAKE 1 TABLET (0.5 MG TOTAL) BY MOUTH AT BEDTIME AS NEEDED. 30 tablet 0   apixaban  (ELIQUIS ) 5 MG TABS tablet Take 1 tablet (5 mg total) by mouth 2 (two) times daily. 60 tablet 11   Blood Glucose Monitoring Suppl (ONE TOUCH ULTRA 2) w/Device KIT CHECK IN THE MORNING, AT NOON, AND AT BEDTIME. MAY SUBSTITUTE TO ANY MANUFACTURER COVERED BY PATIENT'S INSURANCE. 300 kit 3   busPIRone  (BUSPAR ) 5 MG tablet Take 5 mg by mouth 2 (two) times daily.     Calcium  Carb-Cholecalciferol  (CALCIUM  600/VITAMIN D3) 600-20 MG-MCG TABS Take 1 tablet by mouth 2 (two) times daily.     Cholecalciferol  (VITAMIN D3) 50 MCG (2000 UT) capsule Take 2,000 Units by mouth daily.     clobetasol  ointment (TEMOVATE ) 0.05 % Apply topically 2 (two) times daily. Apply topically to affected twice daily until improved. (Patient taking differently: Apply 1 Application topically 2 (two) times daily as needed (progeria nodularis).) 30 g 2   digoxin  (LANOXIN ) 0.25 MG tablet TAKE 1 TABLET BY MOUTH EVERY DAY 90 tablet 3   Docusate Calcium  (STOOL SOFTENER PO) Take 2 tablets by mouth at bedtime.     dofetilide  (TIKOSYN ) 500 MCG capsule Take 1 capsule (500 mcg total) by mouth 2 (two) times daily.  60 capsule 6   empagliflozin  (JARDIANCE ) 25 MG TABS tablet Take 1 tablet (25 mg total) by mouth daily before breakfast. 90 tablet 1   Flaxseed, Linseed, (FLAXSEED OIL) 1400 MG CAPS Take 1,400 mg by mouth 2 (two) times daily.     furosemide  (LASIX ) 20 MG tablet TAKE 1 TABLET BY MOUTH EVERY DAY (Patient taking differently: Take 20 mg by mouth daily as needed for fluid.) 30 tablet 6   Glucose Blood (BLOOD  GLUCOSE TEST STRIPS) STRP 1 each by In Vitro route in the morning, at noon, and at bedtime. May substitute to any manufacturer covered by patient's insurance. 100 strip 9   glucose blood test strip Use as instructed 100 each 12   linaclotide  (LINZESS ) 290 MCG CAPS capsule Take 1 capsule (290 mcg total) by mouth daily before breakfast. 30 capsule 2   losartan  (COZAAR ) 25 MG tablet Take 1 tablet (25 mg total) by mouth daily. 90 tablet 3   magnesium  oxide (MAG-OX) 400 MG tablet Take 1 tablet (400 mg total) by mouth daily. 30 tablet 6   metFORMIN  (GLUCOPHAGE -XR) 500 MG 24 hr tablet TAKE 1 TABLET BY MOUTH TWICE A DAY WITH FOOD 60 tablet 1   metoprolol  tartrate (LOPRESSOR ) 100 MG tablet Take 1 tablet (100 mg total) by mouth 2 (two) times daily. 180 tablet 3   Multiple Vitamins-Minerals (MULTIVITAMIN ADULT PO) Take 1 tablet by mouth daily. One a day     pantoprazole  (PROTONIX ) 40 MG tablet TAKE 1 TABLET BY MOUTH TWICE A DAY 180 tablet 1   potassium chloride  SA (KLOR-CON  M) 20 MEQ tablet Take 1 tablet (20 mEq total) by mouth daily. 30 tablet 6   RYBELSUS  14 MG TABS Take 1 tablet by mouth every morning.     venlafaxine  XR (EFFEXOR -XR) 150 MG 24 hr capsule Take 1 capsule (150 mg total) by mouth daily with breakfast. Take total of 187.5 mg daily. Take along with 37.5 mg cap 90 capsule 0   venlafaxine  XR (EFFEXOR -XR) 37.5 MG 24 hr capsule Take 1 capsule (37.5 mg total) by mouth daily with breakfast. Take total of 187.5 mg daily. Take along with 150 mg cap 90 capsule 0   No current facility-administered medications for this encounter.    Physical Exam: BP 116/80   Pulse (!) 105   Ht 5' 5 (1.651 m)   Wt 93.1 kg   BMI 34.15 kg/m   GEN: Well nourished, well developed in no acute distress NECK: No JVD CARDIAC: Irregularly irregular rate and rhythm, no murmurs, rubs, gallops RESPIRATORY:  Clear to auscultation without rales, wheezing or rhonchi  ABDOMEN: Soft, non-tender, non-distended EXTREMITIES:  No  edema; No deformity    Wt Readings from Last 3 Encounters:  11/11/23 93.1 kg  11/05/23 96 kg  10/29/23 95.6 kg     EKG today demonstrates  Afib Vent. rate 105 BPM PR interval * ms QRS duration 82 ms QT/QTcB 334/441 ms   Echo 07/10/22 demonstrated   1. Left ventricular ejection fraction, by estimation, is 50 to 55%. The  left ventricle has low normal function. The left ventricle has no regional  wall motion abnormalities. There is mild left ventricular hypertrophy.  Left ventricular diastolic parameters are indeterminate.   2. Right ventricular systolic function is normal. The right ventricular  size is normal. Tricuspid regurgitation signal is inadequate for assessing  PA pressure.   3. Left atrial size was moderately dilated.   4. The mitral valve is normal in structure. No evidence of  mitral valve  regurgitation. No evidence of mitral stenosis.   5. The aortic valve is normal in structure. Aortic valve regurgitation is  not visualized. No aortic stenosis is present.    CHA2DS2-VASc Score = 4  The patient's score is based upon: CHF History: 0 HTN History: 1 Diabetes History: 1 Stroke History: 0 Vascular Disease History: 0 Age Score: 1 Gender Score: 1       ASSESSMENT AND PLAN: Persistent Atrial Fibrillation (ICD10:  I48.19) The patient's CHA2DS2-VASc score is 4, indicating a 4.8% annual risk of stroke.   S/p dofetilide  loading 10/2023. Had early recurrence due to acute infection.  We discussed rhythm control options. Will plan for DCCV. Recent labs reviewed.  Continue Eliquis  5 mg BID Continue dofetilide  500 mcg BID Continue digoxin  0.25 mg daily Continue Lopressor  100 mg BID  Secondary Hypercoagulable State (ICD10:  D68.69) The patient is at significant risk for stroke/thromboembolism based upon her CHA2DS2-VASc Score of 4.  Continue Apixaban  (Eliquis ). No bleeding issues.   High Risk Medication Monitoring (ICD 10: Z79.899) QT interval on ECG acceptable for  dofetilide  monitoring. Recent labs reviewed.   HTN Stable on current regimen  Obesity Body mass index is 34.15 kg/m.  Encouraged lifestyle modification   Follow up with Charlies Arthur as scheduled post DCCV.   Informed Consent   Shared Decision Making/Informed Consent The risks (stroke, cardiac arrhythmias rarely resulting in the need for a temporary or permanent pacemaker, skin irritation or burns and complications associated with conscious sedation including aspiration, arrhythmia, respiratory failure and death), benefits (restoration of normal sinus rhythm) and alternatives of a direct current cardioversion were explained in detail to Ms. Mellinger and she agrees to proceed.       [facility] [address] [place], [place] [address] [phone]

## 2023-11-11 NOTE — Progress Notes (Unsigned)
   11/11/2023 Name: Lisa Gutierrez MRN: [id number] DOB: 10/09/1950  No chief complaint on file.   {Visit Ubez:73349}   Subjective:  Care Team: Primary Care Provider: [name] ; Next Scheduled Visit: ***  Future Appointments  Date Time Provider Department Center  11/11/2023  2:30 PM Nellene Quita SAUNDERS, [place] [facility] H&V  11/20/2023  2:00 PM Vickey Mettle, MD [facility] None  11/28/2023 10:05 AM Leverne Charlies Helling, PA-C [facility] H&V  01/28/2024 11:20 AM Gerard Frederick, NP [facility] None    Medication Access/Adherence  Current Pharmacy:  [facility] [phone] [place], [place] - [address] [place] [address] Phone: [phone] Fax: [phone]  [facility] - [place], [place] - [address], [address], [address] [place] [address] Phone: [phone] Fax: [phone]  [name], [place] - [address] [place] [address] Phone: [phone] Fax: [phone]   - [facility] [address] [place] [address] Phone: [phone] Fax: [phone]  [name] [address] [place] [address] Phone: [phone] Fax: [phone]   Patient reports affordability concerns with their medications: {YES/NO:21197} Patient reports access/transportation concerns to their pharmacy: {YES/NO:21197} Patient reports adherence concerns with their medications:  {YES/NO:21197} ***   {Pharmacy S/O Choices:26420}   Objective:  Lab Results  Component Value Date   HGBA1C 7.6 (H) 11/06/2023   Lab Results  Component Value Date   HGBA1C 7.6 (H) 11/06/2023   HGBA1C 9.2 (H) 03/26/2023   HGBA1C 9.1 12/27/2022   HGBA1C 7.3 09/25/2022   HGBA1C 7.5 (H) 07/10/2022   HGBA1C 7.7 (H) 08/24/2021   HGBA1C 7.8 (A) 01/03/2021   HGBA1C 7.1 (A) 07/05/2020   HGBA1C 6.6 (A) 02/12/2020   HGBA1C 6.2 (A) 02/10/2019    HGBA1C 6.4 (A) 06/19/2018   HGBA1C 5.9 (A) 12/06/2017    Lab Results  Component Value Date   CREATININE 0.41 (L) 11/07/2023   BUN 8 11/07/2023   NA 139 11/07/2023   K 4.1 11/07/2023   CL 104 11/07/2023   CO2 24 11/07/2023    Lab Results  Component Value Date   CHOL 163 03/26/2023   HDL 24 (L) 03/26/2023   LDLCALC 83 03/26/2023   TRIG 340 (H) 03/26/2023   CHOLHDL 6.8 (H) 03/26/2023    Medications Reviewed Today   Medications were not reviewed in this encounter       Assessment/Plan:   {Pharmacy A/P Choices:26421}  Follow Up Plan: ***  [facility]   [phone]

## 2023-11-11 NOTE — Patient Instructions (Addendum)
 Hold Jardiance  7/29 until procedure then may resume   Cardioversion scheduled for: 12/16/23 Friday at 10:00am   - Arrive at the Main Entr[facility] ([address]  and check in with ADMITTING at 10:00 am    - Do not eat or drink anything after midnight the night prior to your procedure.   - Take all your morning medication (except diabetic medications) with a sip of water prior to arrival.  - Do NOT miss any doses of your blood thinner - if you should miss a dose or take a dose more than 4 hours late -- please notify our office immediately.  - You will not be able to drive home after your procedure. Please ensure you have a responsible adult to drive you home. You will need someone with you for 24 hours post procedure.     - Expect to be in the procedural area approximately 2 hours.   - If you feel as if you go back into normal rhythm prior to scheduled cardioversion, please notify our office immediately.   If your procedure is canceled in the cardioversion suite you will be charged a cancellation fee.      Hold below medications 72 hours prior to scheduled procedure/anesthesia. Restart medication on the following day after scheduled procedure/anesthesia  Dapagliflozin (Farxiga)     For those patients who have a scheduled procedure/anesthesia on the same day of the week as their dose, hold the medication on the day of surgery.  They can take their scheduled dose the week before.  **Patients on the above medications scheduled for elective procedures that have not held the medication for the appropriate amount of time are at risk of cancellation or change in the anesthetic plan.

## 2023-11-12 ENCOUNTER — Telehealth: Payer: Self-pay | Admitting: *Deleted

## 2023-11-12 ENCOUNTER — Telehealth: Payer: Self-pay

## 2023-11-12 NOTE — Progress Notes (Unsigned)
 Complex Care Management Care Guide Note  11/12/2023 Name: Lisa Gutierrez MRN: [id number] DOB: Sep 03, 1950  Lisa Gutierrez is a 73 y.o. year old female who is a primary care patient of Clapp, Kara F, PA-C and is actively engaged with the care management team. I reached out to [name] by phone today to assist with re-scheduling  with the RN Case Manager.  Follow up plan: Unsuccessful telephone outreach attempt made. A HIPAA compliant phone message was left for the patient providing contact information and requesting a return call.  [name]  [facility]  Value-Based Care Institute, [facility] Guide  Direct Dial: [phone]  Fax [phone]

## 2023-11-12 NOTE — Progress Notes (Signed)
   11/12/2023  Patient ID: Lisa Gutierrez, female   DOB: 09-05-1950, 73 y.o.   MRN: [id number]  Patient had question regarding klor con m administration - reviewed tips in detail with her. Also requesting me to reach out to cardiology to confirm she should be on the potassium still.   Lab Results  Component Value Date   K 4.1 11/07/2023   K 3.7 11/06/2023   K 3.9 11/05/2023   K 3.9 11/01/2023   K 4.0 10/31/2023   K 3.9 10/30/2023   K 4.5 10/29/2023   K 4.8 09/10/2023   K 4.4 05/30/2023   K 3.5 05/10/2023   Lang Sieve, PharmD, BCGP Clinical Pharmacist  [phone]

## 2023-11-12 NOTE — Progress Notes (Signed)
   11/12/2023  Patient ID: Lisa Gutierrez [name], female   DOB: [birth year], 73 y.o.   MRN: [id number]  Patient informed of cardiology's recommendation to continue therapy as is. No further questions at this time.   Lang Sieve, PharmD, BCGP Clinical Pharmacist  [phone]

## 2023-11-12 NOTE — Addendum Note (Signed)
 Encounter addended by: Janel Nancy SAUNDERS, RN on: 11/12/2023 4:19 PM  Actions taken: Clinical Note Signed

## 2023-11-13 NOTE — Progress Notes (Signed)
 Complex Care Management Care Guide Note  11/13/2023 Name: Lisa Gutierrez MRN: [id number] DOB: January 25, 1951  Lisa Gutierrez is a 73 y.o. year old female who is a primary care patient of Clapp, Kara F, PA-C and is actively engaged with the care management team. I reached out to [name] by phone today to assist with scheduling  with the RN Case Manager.  Follow up plan: Telephone appointment with complex care management team member scheduled for:  7/31  [name]  [facility]  Value-Based Care Institute, [facility] Guide  Direct Dial: [phone]  Fax [phone]

## 2023-11-14 ENCOUNTER — Other Ambulatory Visit: Payer: Self-pay

## 2023-11-14 ENCOUNTER — Telehealth: Payer: Self-pay | Admitting: Pharmacy Technician

## 2023-11-14 NOTE — Progress Notes (Signed)
 VM left for return call to review instructions for appointment 11/15/23.

## 2023-11-14 NOTE — Patient Instructions (Signed)
 Visit Information  Thank you for taking time to visit with me today. Please don't hesitate to contact me if I can be of assistance to you before our next scheduled appointment.  Your next care management appointment is by telephone on 11/29/23 at 10 AM with Channing Larry  Please call the care guide team at [phone] if you need to cancel, schedule, or reschedule an appointment.   Please call the Suicide and Crisis Lifeline: 988 call 1-800-273-TALK (toll free, 24 hour hotline) if you are experiencing a Mental Health or Behavioral Health Crisis or need someone to talk to.  Rosaline Finlay, RN MSN [place]  [facility] Population Health RN Care Manager Direct Dial: [phone]  Fax: [phone]

## 2023-11-14 NOTE — Patient Outreach (Signed)
 Complex Care Management   Visit Note  11/14/2023  Name:  Lisa Gutierrez MRN: [id number] DOB: December 29, 1950  Situation: Referral received for Complex Care Management related to Atrial Fibrillation I obtained verbal consent from Patient.  Visit completed with Jon [name]  on the phone  Background:   Past Medical History:  Diagnosis Date   Anxiety    Atrial fibrillation (HCC)    Depression    Depression    Phreesia 07/02/2020   Diabetes mellitus without complication (HCC)    GERD (gastroesophageal reflux disease)    Hypertension    IBS (irritable bowel syndrome)    Neuromuscular disorder (HCC)     Assessment: Patient Reported Symptoms:  Cognitive Cognitive Status: Able to follow simple commands, Alert and oriented to person, place, and time, Normal speech and language skills Cognitive/Intellectual Conditions Management [RPT]: None reported or documented in medical history or problem list      Neurological Neurological Review of Symptoms: Dizziness, Weakness (dizziness and weakness r/t A Fib) Neurological Management Strategies: Adequate rest, Coping strategies  HEENT HEENT Symptoms Reported: No symptoms reported HEENT Management Strategies: Routine screening    Cardiovascular Cardiovascular Symptoms Reported: Other: Other Cardiovascular Symptoms: Patient reports that when she is in A Fib she has this distinctive breathing, fatigue, weakness, dizziness Does patient have uncontrolled Hypertension?: No (Confirmed patient does have BP monitor) Cardiovascular Management Strategies: Medication therapy, Routine screening Cardiovascular Comment: Patient is scheduled for a cardioversion 11/15/23.  Respiratory Respiratory Symptoms Reported: Shortness of breath (Shortness of breath when in A Fib) Respiratory Management Strategies: Adequate rest, Coping strategies  Endocrine Endocrine Symptoms Reported: Weakness or fatigue Is patient diabetic?: Yes Is patient checking blood  sugars at home?: Yes List most recent blood sugar readings, include date and time of day: Twice a day (morning and night). 150 fasting this morning Endocrine Comment: Patient notes she has an upcoming visit with endocrinology  Gastrointestinal Gastrointestinal Symptoms Reported: Change in appetite, Unintentional weight loss Additional Gastrointestinal Details: Patient reports her appetite has been lousy. She reports a 50-60 lb weight loss over the past 4-5 months, although per chart review weight has been stable. Last BM yesterday. Gastrointestinal Management Strategies: Medication therapy    Genitourinary Genitourinary Symptoms Reported: No symptoms reported    Integumentary Integumentary Symptoms Reported: Other Other Integumentary Symptoms: Patient reports she was prescribed Mupirocin  ointment by dermatology, she uses twice a day. She reports spots begin as an itchy area, and then she will scratch it and they become sores. Sores on are her ankles and arms. She reports ointment does seem to be helping. Skin Management Strategies: Medication therapy, Routine screening  Musculoskeletal Musculoskelatal Symptoms Reviewed: Weakness (weakness r/t A Fib) Musculoskeletal Management Strategies: Coping strategies, Adequate rest Musculoskeletal Comment: Patient reports she is too weak to any regular exercise Falls in the past year?: Yes Number of falls in past year: 2 or more Was there an injury with Fall?: No Fall Risk Category Calculator: 2 Patient Fall Risk Level: Moderate Fall Risk Patient at Risk for Falls Due to: History of fall(s) Fall risk Follow up: Falls evaluation completed, Education provided, Falls prevention discussed  Psychosocial Psychosocial Symptoms Reported: Depression - if selected complete PHQ 2-9, Anxiety - if selected complete GAD Behavioral Management Strategies: Counseling, Medication therapy, Support system Behavioral Health Comment: Patient has an upcoming appointment with  psych. She reports they are working to find her a Veterinary surgeon, as her previous one left. Patient declines LCSW referral at this time Major Change/Loss/Stressor/Fears (CP): Medical condition, self Techniques to  Cope with Loss/Stress/Change: Spiritual practice(s), Counseling, Medication Quality of Family Relationships: helpful, involved, supportive Do you feel physically threatened by others?: No      11/14/2023   12:27 PM  Depression screen PHQ 2/9  Decreased Interest 3  Down, Depressed, Hopeless 3  PHQ - 2 Score 6  Altered sleeping 3  Tired, decreased energy 3  Change in appetite 3  Feeling bad or failure about yourself  2  Trouble concentrating 1  Moving slowly or fidgety/restless 1  Suicidal thoughts 0  PHQ-9 Score 19  Difficult doing work/chores Very difficult    There were no vitals filed for this visit.  Medications Reviewed Today     Reviewed by Arno Rosaline SQUIBB, RN (Registered Nurse) on 11/14/23 at 1215  Med List Status: <None>   Medication Order Taking? Sig Documenting Provider Last Dose Status Informant  acetaminophen  (TYLENOL ) 500 MG tablet [id number] Yes Take 1,000 mg by mouth every 8 (eight) hours as needed for mild pain (pain score 1-3), moderate pain (pain score 4-6) or headache. [provider]  Active Self           Med Note ELSWORTH, JACQUELINE L   Tue Sep 10, 2023 11:23 AM)    ALPRAZolam  (XANAX ) 0.5 MG tablet [id number] Yes TAKE 1 TABLET (0.5 MG TOTAL) BY MOUTH AT BEDTIME AS NEEDED. Chandra Toribio POUR, MD  Active Self  apixaban  (ELIQUIS ) 5 MG TABS tablet [id number] Yes Take 1 tablet (5 mg total) by mouth 2 (two) times daily. Riddle, Suzann, NP  Active Self  Blood Glucose Monitoring Suppl (ONE TOUCH ULTRA 2) w/Device KIT [id number] Yes CHECK IN THE MORNING, AT NOON, AND AT BEDTIME. MAY SUBSTITUTE TO ANY MANUFACTURER COVERED BY PATIENT'S INSURANCE. Wallace Joesph LABOR, PA  Active Self  busPIRone  (BUSPAR ) 5 MG tablet [id number] Yes Take 5 mg by mouth 2 (two) times  daily. [provider]  Active Self  Calcium  Carb-Cholecalciferol  (CALCIUM  600/VITAMIN D3) 600-20 MG-MCG TABS [id number] Yes Take 1 tablet by mouth 2 (two) times daily. [provider]  Active Self  Cholecalciferol  (VITAMIN D3) 50 MCG (2000 UT) capsule [id number] Yes Take 2,000 Units by mouth daily. [provider]  Active Self  clobetasol  ointment (TEMOVATE ) 0.05 % [id number] Yes Apply topically 2 (two) times daily. Apply topically to affected twice daily until improved.  Patient taking differently: Apply 1 Application topically 2 (two) times daily as needed (progeria nodularis).   Boscia, Heather E, NP  Active Self           Med Note ELSWORTH, JACQUELINE L   Tue Sep 10, 2023 11:23 AM)    digoxin  (LANOXIN ) 0.25 MG tablet [id number] Yes TAKE 1 TABLET BY MOUTH EVERY DAY Hammock, Sheri, NP  Active Self  Docusate Calcium  (STOOL SOFTENER PO) [id number] Yes Take 2 tablets by mouth at bedtime. [provider]  Active Self  dofetilide  (TIKOSYN ) 500 MCG capsule [id number] Yes Take 1 capsule (500 mcg total) by mouth 2 (two) times daily. Aniceto Daphne CROME, NP  Active Self  empagliflozin  (JARDIANCE ) 25 MG TABS tablet [id number] Yes Take 1 tablet (25 mg total) by mouth daily before breakfast. Wallace Joesph LABOR, PA  Active Self           Med Note SIMMIE, RAVEN M   Thu Sep 05, 2023 11:20 AM)    Flaxseed, Linseed, (FLAXSEED OIL) 1400 MG CAPS [id number] Yes Take 1,400 mg by mouth 2 (two) times daily. [provider]  Active Self  furosemide  (LASIX ) 20 MG tablet [id number]  Yes TAKE 1 TABLET BY MOUTH EVERY DAY  Patient taking differently: Take 20 mg by mouth daily as needed for fluid.   Gerard Frederick, NP  Active Self  Glucose Blood (BLOOD GLUCOSE TEST STRIPS) STRP [id number] Yes 1 each by In Vitro route in the morning, at noon, and at bedtime. May substitute to any manufacturer covered by patient's insurance. Wallace Joesph LABOR, PA  Active Self  glucose blood test strip [id number]  Yes Use as instructed Wallace Joesph LABOR, PA  Active Self  linaclotide  (LINZESS ) 290 MCG CAPS capsule [id number] Yes Take 1 capsule (290 mcg total) by mouth daily before breakfast. Chandra Toribio POUR, MD  Active Self           Med Note (Rogene Meth P   Thu Nov 14, 2023 12:14 PM)    losartan  (COZAAR ) 25 MG tablet [id number] Yes Take 1 tablet (25 mg total) by mouth daily. Gerard Frederick, NP  Active Self  magnesium  oxide (MAG-OX) 400 MG tablet [id number] Yes Take 1 tablet (400 mg total) by mouth daily. Aniceto Daphne CROME, NP  Active Self  metFORMIN  (GLUCOPHAGE -XR) 500 MG 24 hr tablet [id number] Yes TAKE 1 TABLET BY MOUTH TWICE A DAY WITH FOOD Gayle Kara F, PA-C  Active Self  metoprolol  tartrate (LOPRESSOR ) 100 MG tablet [id number] Yes Take 1 tablet (100 mg total) by mouth 2 (two) times daily. Gerard Frederick, NP  Active Self  Multiple Vitamins-Minerals (MULTIVITAMIN ADULT PO) [id number] Yes Take 1 tablet by mouth daily. One a day [provider]  Active Self  pantoprazole  (PROTONIX ) 40 MG tablet [id number] Yes TAKE 1 TABLET BY MOUTH TWICE A DAY Chandra Toribio POUR, MD  Active Self  potassium chloride  SA (KLOR-CON  M) 20 MEQ tablet [id number] Yes Take 1 tablet (20 mEq total) by mouth daily. Aniceto Daphne CROME, NP  Active Self  RYBELSUS  14 MG TABS [id number]  Take 1 tablet by mouth every morning. [provider]  Active Self  venlafaxine  XR (EFFEXOR -XR) 150 MG 24 hr capsule [id number]  Take 1 capsule (150 mg total) by mouth daily with breakfast. Take total of 187.5 mg daily. Take along with 37.5 mg cap Hisada, Katheren, MD  Active Self  venlafaxine  XR (EFFEXOR -XR) 37.5 MG 24 hr capsule [id number]  Take 1 capsule (37.5 mg total) by mouth daily with breakfast. Take total of 187.5 mg daily. Take along with 150 mg cap Hisada, Reina, MD  Active Self            Recommendation:   Specialty provider follow-up cardiology/A Fib clinic Continue Current Plan of Care  Follow Up Plan:   Telephone follow up  appointment date/time:  11/29/23 at 10 AM with Channing Larry (pod 7)  Rosaline Finlay, RN MSN [place]  [facility] RN Care Manager Direct Dial: [phone]  Fax: [phone]

## 2023-11-14 NOTE — Progress Notes (Signed)
   11/14/2023  Patient ID: Lisa Gutierrez, female   DOB: 1950/04/19, 73 y.o.   MRN: [id number]  Incoming call received from patient. HIPAA verified.  Patient informs she needs a refill for Linzess  from patient assistance program.  Provided patient phone number to [employer] at [phone]. Patient informs she will call and if any issues she will call me or Lang Sieve, PharmD back.  Kemiya Batdorf, CPhT [place] Office: [phone] Email: [email or web address]

## 2023-11-15 ENCOUNTER — Encounter (HOSPITAL_COMMUNITY)
Admission: RE | Disposition: A | Discharge status: Home / Self Care | Payer: Self-pay | Source: Home / Self Care | Attending: Cardiovascular Disease

## 2023-11-15 ENCOUNTER — Ambulatory Visit (HOSPITAL_BASED_OUTPATIENT_CLINIC_OR_DEPARTMENT_OTHER)

## 2023-11-15 ENCOUNTER — Ambulatory Visit (HOSPITAL_COMMUNITY)

## 2023-11-15 ENCOUNTER — Ambulatory Visit (HOSPITAL_COMMUNITY)
Admission: RE | Admit: 2023-11-15 | Discharge: 2023-11-15 | Disposition: A | Discharge status: Home / Self Care | Attending: Cardiovascular Disease | Admitting: Cardiovascular Disease

## 2023-11-15 DIAGNOSIS — I10 Essential (primary) hypertension: Secondary | ICD-10-CM | POA: Diagnosis not present

## 2023-11-15 DIAGNOSIS — Z7984 Long term (current) use of oral hypoglycemic drugs: Secondary | ICD-10-CM | POA: Diagnosis not present

## 2023-11-15 DIAGNOSIS — F411 Generalized anxiety disorder: Secondary | ICD-10-CM | POA: Diagnosis not present

## 2023-11-15 DIAGNOSIS — E1165 Type 2 diabetes mellitus with hyperglycemia: Secondary | ICD-10-CM | POA: Diagnosis not present

## 2023-11-15 DIAGNOSIS — Z7901 Long term (current) use of anticoagulants: Secondary | ICD-10-CM | POA: Diagnosis not present

## 2023-11-15 DIAGNOSIS — Z87891 Personal history of nicotine dependence: Secondary | ICD-10-CM

## 2023-11-15 DIAGNOSIS — K219 Gastro-esophageal reflux disease without esophagitis: Secondary | ICD-10-CM | POA: Diagnosis not present

## 2023-11-15 DIAGNOSIS — F418 Other specified anxiety disorders: Secondary | ICD-10-CM

## 2023-11-15 DIAGNOSIS — Z79899 Other long term (current) drug therapy: Secondary | ICD-10-CM | POA: Insufficient documentation

## 2023-11-15 DIAGNOSIS — I4819 Other persistent atrial fibrillation: Secondary | ICD-10-CM | POA: Insufficient documentation

## 2023-11-15 DIAGNOSIS — I4891 Unspecified atrial fibrillation: Secondary | ICD-10-CM

## 2023-11-15 DIAGNOSIS — E119 Type 2 diabetes mellitus without complications: Secondary | ICD-10-CM | POA: Insufficient documentation

## 2023-11-15 HISTORY — PX: CARDIOVERSION: EP1203

## 2023-11-15 LAB — GLUCOSE, CAPILLARY: Glucose-Capillary: 168 mg/dL — ABNORMAL HIGH (ref 70–99)

## 2023-11-15 SURGERY — CARDIOVERSION (CATH LAB)
Anesthesia: Monitor Anesthesia Care

## 2023-11-15 MED ORDER — PROPOFOL 10 MG/ML IV BOLUS
INTRAVENOUS | Status: DC | PRN
  Administered 2023-11-15: 20 mg via INTRAVENOUS
  Administered 2023-11-15: 50 mg via INTRAVENOUS
  Administered 2023-11-15: 20 mg via INTRAVENOUS

## 2023-11-15 MED ORDER — METOPROLOL TARTRATE 100 MG PO TABS
100.0000 mg | ORAL_TABLET | Freq: Two times a day (BID) | ORAL | 3 refills | Status: DC
  Filled 2023-12-05: qty 60, 30d supply, fill #0
  Filled ????-??-??: fill #0

## 2023-11-15 SURGICAL SUPPLY — 1 items: PAD DEFIB RADIO PHYSIO CONN (PAD) ×1 IMPLANT

## 2023-11-15 NOTE — Transfer of Care (Signed)
 Immediate Anesthesia Transfer of Care Note  Patient: Lisa Gutierrez  Procedure(s) Performed: CARDIOVERSION  Patient Location: Cath Lab  Anesthesia Type:General  Level of Consciousness: drowsy  Airway & Oxygen Therapy: Patient Spontanous Breathing and Patient connected to nasal cannula oxygen  Post-op Assessment: Report given to RN and Post -op Vital signs reviewed and stable  Post vital signs: Reviewed and stable  Last Vitals:  Vitals Value Taken Time  BP    Temp    Pulse 94 11/15/23 10:29  Resp 23 11/15/23 10:29  SpO2 94 % 11/15/23 10:29  Vitals shown include unfiled device data.  Last Pain:  Vitals:   11/15/23 1003  TempSrc: Temporal         Complications: No notable events documented.

## 2023-11-15 NOTE — Anesthesia Postprocedure Evaluation (Signed)
 Anesthesia Post Note  Patient: Lisa Gutierrez  Procedure(s) Performed: CARDIOVERSION     Patient location during evaluation: Cath Lab Anesthesia Type: MAC Level of consciousness: awake and alert Pain management: pain level controlled Vital Signs Assessment: post-procedure vital signs reviewed and stable Respiratory status: spontaneous breathing, nonlabored ventilation, respiratory function stable and patient connected to nasal cannula oxygen Cardiovascular status: stable and blood pressure returned to baseline Postop Assessment: no apparent nausea or vomiting Anesthetic complications: no   No notable events documented.  Last Vitals:  Vitals:   11/15/23 1003 11/15/23 1015  BP: 130/89 116/85  Pulse: (!) 108 91  Resp: 18 20  Temp: 36.8 C   SpO2: 94% 94%    Last Pain:  Vitals:   11/15/23 1003  TempSrc: Temporal                 [name]

## 2023-11-15 NOTE — Anesthesia Preprocedure Evaluation (Addendum)
 Anesthesia Evaluation  Patient identified by MRN, date of birth, ID band Patient awake    Reviewed: Allergy & Precautions, NPO status , Patient's Chart, lab work & pertinent test results  History of Anesthesia Complications Negative for: history of anesthetic complications  Airway Mallampati: II  TM Distance: >3 FB Neck ROM: Full    Dental no notable dental hx. (+) Teeth Intact   Pulmonary neg sleep apnea, neg COPD, Patient abstained from smoking.Not current smoker, former smoker   Pulmonary exam normal breath sounds clear to auscultation       Cardiovascular Exercise Tolerance: Good METShypertension, Pt. on medications (-) CAD and (-) Past MI + dysrhythmias Atrial Fibrillation  Rhythm:Irregular Rate:Normal - Systolic murmurs    Neuro/Psych  PSYCHIATRIC DISORDERS Anxiety Depression       GI/Hepatic ,GERD  Medicated,,(+)     (-) substance abuse    Endo/Other  diabetes  Oral daily GLP1 rybelsus  last taken yesterday. Denies gi symptoms today  Renal/GU negative Renal ROS     Musculoskeletal   Abdominal  (+) + obese  Peds  Hematology   Anesthesia Other Findings Past Medical History: No date: Anxiety No date: Atrial fibrillation (HCC) No date: Depression No date: Depression     Comment:  Phreesia 07/02/2020 No date: Diabetes mellitus without complication (HCC) No date: GERD (gastroesophageal reflux disease) No date: Hypertension No date: IBS (irritable bowel syndrome) No date: Neuromuscular disorder (HCC)  Reproductive/Obstetrics                              Anesthesia Physical Anesthesia Plan  ASA: 3  Anesthesia Plan: MAC and General   Post-op Pain Management: Minimal or no pain anticipated   Induction: Intravenous  PONV Risk Score and Plan: 3 and Propofol  infusion, TIVA and Ondansetron   Airway Management Planned: Nasal Cannula  Additional Equipment: None  Intra-op Plan:    Post-operative Plan:   Informed Consent: I have reviewed the patients History and Physical, chart, labs and discussed the procedure including the risks, benefits and alternatives for the proposed anesthesia with the patient or authorized representative who has indicated his/her understanding and acceptance.     Dental advisory given  Plan Discussed with: CRNA and Surgeon  Anesthesia Plan Comments: (Discussed risks of anesthesia with patient, including possibility of difficulty with spontaneous ventilation under anesthesia necessitating airway intervention, PONV, and rare risks such as cardiac or respiratory or neurological events, and allergic reactions. Discussed the role of CRNA in patient's perioperative care. Patient understands.)         Anesthesia Quick Evaluation

## 2023-11-15 NOTE — Telephone Encounter (Signed)
 Called patient and left message for call back.

## 2023-11-15 NOTE — Interval H&P Note (Signed)
 History and Physical Interval Note:  11/15/2023 10:34 AM  Lisa Gutierrez [name]  has presented today for surgery, with the diagnosis of AFIB.  The various methods of treatment have been discussed with the patient and family. After consideration of risks, benefits and other options for treatment, the patient has consented to  Procedure(s): CARDIOVERSION (N/A) as a surgical intervention.  The patient's history has been reviewed, patient examined, no change in status, stable for surgery.  I have reviewed the patient's chart and labs.  Questions were answered to the patient's satisfaction.     Delmo Matty

## 2023-11-15 NOTE — Discharge Instructions (Signed)

## 2023-11-15 NOTE — Op Note (Addendum)
 Procedure: Electrical Cardioversion Indications:  Atrial Fibrillation  Procedure Details:  Consent: Risks of procedure as well as the alternatives and risks of each were explained to the (patient/caregiver).  Consent for procedure obtained.  Time Out: Verified patient identification, verified procedure, site/side was marked, verified correct patient position, special equipment/implants available, medications/allergies/relevent history reviewed, required imaging and test results available.  Performed  Patient placed on cardiac monitor, pulse oximetry, supplemental oxygen as necessary.  Sedation given: propofol  100 mg IV, Dr. Boone Pacer pads placed anterior and posterior chest.  Cardioverted 1 time(s).  Cardioversion with synchronized biphasic 200J shock.  Evaluation: Findings: Post procedure EKG shows: NSR. Immediately after cardioversion, there were two lengthy pauses, each approximately 5 long, followed by junctional beats and then sinus bradycardia 50 bpm. Complications: None Patient did tolerate procedure well. Reduced the metoprolol  to 50 mg twice daily until follow up.  Time Spent Directly with the Patient:  30 minutes   [name] 11/15/2023, 10:41 AM

## 2023-11-16 ENCOUNTER — Encounter (HOSPITAL_COMMUNITY): Payer: Self-pay | Admitting: Cardiovascular Disease

## 2023-11-16 NOTE — Progress Notes (Unsigned)
 Virtual Visit via Video Note  I connected with Lisa Gutierrez on 11/20/23 at  2:00 PM EDT by a video enabled telemedicine application and verified that I am speaking with the correct person using two identifiers.  Location: Patient: home Provider: home office Persons participated in the visit- patient, provider    I discussed the limitations of evaluation and management by telemedicine and the availability of in person appointments. The patient expressed understanding and agreed to proceed.   I discussed the assessment and treatment plan with the patient. The patient was provided an opportunity to ask questions and all were answered. The patient agreed with the plan and demonstrated an understanding of the instructions.   The patient was advised to call back or seek an in-person evaluation if the symptoms worsen or if the condition fails to improve as anticipated.   Katheren Sleet, MD    [facility] MD/PA/NP OP Progress Note  11/20/2023 2:34 PM Lisa Gutierrez  MRN:  [id number]  Chief Complaint:  Chief Complaint  Patient presents with   Follow-up   HPI:  - since the last visit, tikosyn  was initiated. She was admitted for Afib with RVR, triggered by URI.  - Since the last visit, this writer communicated with Dr. Cindie regarding the potential uptitration of venlafaxine . According to the team/pharmacist who was consulted, Ok to increase her venlafaxine . Venlafaxine  is generally thought to not increase QTc.   This is a follow-up appointment for PTSD, depression and anxiety.  She states that it has been nightmares.  She was admitted twice.  She was started on Tikosyn .  Although she was feeling better, she had some infection, and had A-fib again.  She wakes up with nightmares, and not feeling herself.  She has been a little more emotional, which she noticed when she watches TV.  She wants to get better, and get back to herself.  She reports significant frustration that she was  informed that she can have Afib if she were to have any type of infection.  She states that that cannot be right.  She feels very anxious and depressed about this.  She has insomnia.  She has decreased appetite due to dysgeusia.  She denies SI, HI, hallucinations.  She denies alcohol use or substance use.  She thinks her mood has been worse since lowering the dose of BuSpar .  However, she is willing to maintain on the current medication regimen and prioritize treatment for stability in rhythm.   Daily routine: takes care of her 3 dogs, 5 cats, house chores Exercise: unable to do due to knee pain Employment:  Support: none (talks with her oldest granddaughter, who has left to college) Household: husband Marital status: married for 52 years  Number of children: 2 sons.   Visit Diagnosis:    ICD-10-CM   1. PTSD (post-traumatic stress disorder)  F43.10     2. MDD (major depressive disorder), recurrent episode, moderate (HCC)  F33.1     3. GAD (generalized anxiety disorder)  F41.1       Past Psychiatric History: Please see initial evaluation for full details. I have reviewed the history. No updates at this time.     Past Medical History:  Past Medical History:  Diagnosis Date   Anxiety    Atrial fibrillation (HCC)    Depression    Depression    Phreesia 07/02/2020   Diabetes mellitus without complication (HCC)    GERD (gastroesophageal reflux disease)    Hypertension    IBS (  irritable bowel syndrome)    Neuromuscular disorder [facility])     Past Surgical History:  Procedure Laterality Date   ATRIAL FIBRILLATION ABLATION N/A 05/02/2023   Procedure: ATRIAL FIBRILLATION ABLATION;  Surgeon: Cindie Ole DASEN, MD;  Location: [facility] INVASIVE CV LAB;  Service: Cardiovascular;  Laterality: N/A;   BREAST EXCISIONAL BIOPSY     CARDIOVERSION N/A 09/03/2022   Procedure: CARDIOVERSION;  Surgeon: Darron Deatrice LABOR, MD;  Location: [facility] ORS;  Service: Cardiovascular;  Laterality: N/A;   CARDIOVERSION  N/A 06/03/2023   Procedure: CARDIOVERSION;  Surgeon: Darliss Rogue, MD;  Location: [facility] ORS;  Service: Cardiovascular;  Laterality: N/A;   CARDIOVERSION N/A 10/31/2023   Procedure: CARDIOVERSION;  Surgeon: Lonni Slain, MD;  Location: [facility] INVASIVE CV LAB;  Service: Cardiovascular;  Laterality: N/A;   CARDIOVERSION N/A 11/15/2023   Procedure: CARDIOVERSION;  Surgeon: Francyne Headland, MD;  Location: [facility] INVASIVE CV LAB;  Service: Cardiovascular;  Laterality: N/A;   CATARACT EXTRACTION W/PHACO Right 07/24/2022   Procedure: CATARACT EXTRACTION PHACO AND INTRAOCULAR LENS PLACEMENT (IOC) RIGHT DIABETIC  5.93  00:42.5;  Surgeon: Jaye Fallow, MD;  Location: [facility];  Service: Ophthalmology;  Laterality: Right;   CATARACT EXTRACTION W/PHACO Left 08/07/2022   Procedure: CATARACT EXTRACTION PHACO AND INTRAOCULAR LENS PLACEMENT (IOC) LEFT DIABETIC  12.33  00:58.3;  Surgeon: Jaye Fallow, MD;  Location: [facility];  Service: Ophthalmology;  Laterality: Left;   CESAREAN SECTION N/A    Phreesia 07/02/2020   CHOLECYSTECTOMY     EXCISION / BIOPSY BREAST / NIPPLE / DUCT Right 1973   duct removed   SPINE SURGERY N/A    Phreesia 07/02/2020   TUBAL LIGATION N/A    Phreesia 07/02/2020    Family Psychiatric History: Please see initial evaluation for full details. I have reviewed the history. No updates at this time.     Family History:  Family History  Problem Relation Age of Onset   Diabetes Mother    Hypertension Mother    Coronary artery disease Father    Glaucoma Father    Breast cancer Neg Hx     Social History:  Social History   Socioeconomic History   Marital status: Married    Spouse name: Not on file   Number of children: Not on file   Years of education: Not on file   Highest education level: Not on file  Occupational History   Not on file  Tobacco Use   Smoking status: Former    Passive exposure: Past   Smokeless tobacco: Never   Tobacco  comments:    Former smoker 10/29/23  Vaping Use   Vaping status: Never Used  Substance and Sexual Activity   Alcohol use: No    Alcohol/week: 0.0 standard drinks of alcohol   Drug use: Never   Sexual activity: Not Currently    Partners: Male  Other Topics Concern   Not on file  Social History Narrative   Not on file   Social Drivers of Health   Financial Resource Strain: Low Risk  (10/15/2022)   Overall Financial Resource Strain (CARDIA)    Difficulty of Paying Living Expenses: Not very hard  Food Insecurity: No Food Insecurity (11/14/2023)   Hunger Vital Sign    Worried About Running Out of Food in the Last Year: Never true    Ran Out of Food in the Last Year: Never true  Transportation Needs: No Transportation Needs (11/14/2023)   PRAPARE - Administrator, Civil Service (Medical): No  Lack of Transportation (Non-Medical): No  Physical Activity: Inactive (08/09/2022)   Exercise Vital Sign    Days of Exercise per Week: 0 days    Minutes of Exercise per Session: 0 min  Stress: Stress Concern Present (10/15/2022)   [employer] of Occupational Health - Occupational Stress Questionnaire    Feeling of Stress : Rather much  Social Connections: Moderately Integrated (11/06/2023)   Social Connection and Isolation Panel    Frequency of Communication with Friends and Family: More than three times a week    Frequency of Social Gatherings with Friends and Family: More than three times a week    Attends Religious Services: 1 to 4 times per year    Active Member of [employer] or Organizations: No    Attends Banker Meetings: Never    Marital Status: Married    Allergies:  Allergies  Allergen Reactions   Aspirin Other (See Comments)    GI upset   Ibuprofen Other (See Comments)    GERD   Levofloxacin Other (See Comments)    Weight gain   Meloxicam Other (See Comments)    GI upset   Naprosyn  [Naproxen] Other (See Comments)    GI upset   Nsaids Other (See  Comments)    GI upset   Ozempic  (0.25 Or 0.5 Mg-Dose) [Semaglutide (0.25 Or 0.5mg -Dos)] Nausea And Vomiting   Propofol  Other (See Comments)    Had horrible nightmares   Quinolones    Robinul  [Glycopyrrolate] Nausea And Vomiting   Morphine Swelling, Rash and Other (See Comments)   Penicillin V Potassium Rash   Sulfa Antibiotics Rash    Metabolic Disorder Labs: Lab Results  Component Value Date   HGBA1C 7.6 (H) 11/06/2023   MPG 171.42 11/06/2023   MPG 169 07/10/2022   No results found for: PROLACTIN Lab Results  Component Value Date   CHOL 163 03/26/2023   TRIG 340 (H) 03/26/2023   HDL 24 (L) 03/26/2023   CHOLHDL 6.8 (H) 03/26/2023   LDLCALC 83 03/26/2023   LDLCALC 123 (H) 08/24/2021   Lab Results  Component Value Date   TSH 0.758 09/10/2023   TSH 0.814 03/26/2023    Therapeutic Level Labs: No results found for: LITHIUM No results found for: VALPROATE No results found for: CBMZ  Current Medications: Current Outpatient Medications  Medication Sig Dispense Refill   acetaminophen  (TYLENOL ) 500 MG tablet Take 1,000 mg by mouth every 8 (eight) hours as needed for mild pain (pain score 1-3), moderate pain (pain score 4-6) or headache.     ALPRAZolam  (XANAX ) 0.5 MG tablet TAKE 1 TABLET (0.5 MG TOTAL) BY MOUTH AT BEDTIME AS NEEDED. 30 tablet 0   apixaban  (ELIQUIS ) 5 MG TABS tablet Take 1 tablet (5 mg total) by mouth 2 (two) times daily. 60 tablet 11   Blood Glucose Monitoring Suppl (ONE TOUCH ULTRA 2) w/Device KIT CHECK IN THE MORNING, AT NOON, AND AT BEDTIME. MAY SUBSTITUTE TO ANY MANUFACTURER COVERED BY PATIENT'S INSURANCE. 300 kit 3   busPIRone  (BUSPAR ) 5 MG tablet Take 5 mg by mouth 2 (two) times daily.     Calcium  Carb-Cholecalciferol  (CALCIUM  600/VITAMIN D3) 600-20 MG-MCG TABS Take 1 tablet by mouth 2 (two) times daily.     Cholecalciferol  (VITAMIN D3) 50 MCG (2000 UT) capsule Take 2,000 Units by mouth daily.     clobetasol  ointment (TEMOVATE ) 0.05 % Apply  topically 2 (two) times daily. Apply topically to affected twice daily until improved. (Patient taking differently: Apply 1 Application topically  2 (two) times daily as needed (progeria nodularis).) 30 g 2   digoxin  (LANOXIN ) 0.25 MG tablet TAKE 1 TABLET BY MOUTH EVERY DAY 90 tablet 3   Docusate Calcium  (STOOL SOFTENER PO) Take 2 tablets by mouth at bedtime.     dofetilide  (TIKOSYN ) 500 MCG capsule Take 1 capsule (500 mcg total) by mouth 2 (two) times daily. 60 capsule 6   empagliflozin  (JARDIANCE ) 25 MG TABS tablet Take 1 tablet (25 mg total) by mouth daily before breakfast. 90 tablet 1   Flaxseed, Linseed, (FLAXSEED OIL) 1400 MG CAPS Take 1,400 mg by mouth 2 (two) times daily.     furosemide  (LASIX ) 20 MG tablet TAKE 1 TABLET BY MOUTH EVERY DAY (Patient taking differently: Take 20 mg by mouth daily as needed for fluid.) 30 tablet 6   Glucose Blood (BLOOD GLUCOSE TEST STRIPS) STRP 1 each by In Vitro route in the morning, at noon, and at bedtime. May substitute to any manufacturer covered by patient's insurance. 100 strip 9   glucose blood test strip Use as instructed 100 each 12   linaclotide  (LINZESS ) 290 MCG CAPS capsule Take 1 capsule (290 mcg total) by mouth daily before breakfast. 30 capsule 2   losartan  (COZAAR ) 25 MG tablet Take 1 tablet (25 mg total) by mouth daily. 90 tablet 3   magnesium  oxide (MAG-OX) 400 MG tablet Take 1 tablet (400 mg total) by mouth daily. 30 tablet 6   metFORMIN  (GLUCOPHAGE -XR) 500 MG 24 hr tablet TAKE 1 TABLET BY MOUTH TWICE A DAY WITH FOOD 60 tablet 1   metoprolol  tartrate (LOPRESSOR ) 100 MG tablet Take 0.5 tablets (50 mg total) by mouth 2 (two) times daily. 180 tablet 3   Multiple Vitamins-Minerals (MULTIVITAMIN ADULT PO) Take 1 tablet by mouth daily. One a day     pantoprazole  (PROTONIX ) 40 MG tablet TAKE 1 TABLET BY MOUTH TWICE A DAY 180 tablet 1   potassium chloride  SA (KLOR-CON  M) 20 MEQ tablet Take 1 tablet (20 mEq total) by mouth daily. 30 tablet 6    RYBELSUS  14 MG TABS Take 1 tablet by mouth every morning.     venlafaxine  XR (EFFEXOR -XR) 150 MG 24 hr capsule Take 1 capsule (150 mg total) by mouth daily with breakfast. Take total of 187.5 mg daily. Take along with 37.5 mg cap 90 capsule 0   venlafaxine  XR (EFFEXOR -XR) 37.5 MG 24 hr capsule Take 1 capsule (37.5 mg total) by mouth daily with breakfast. Take total of 187.5 mg daily. Take along with 150 mg cap 90 capsule 0   No current facility-administered medications for this visit.     Musculoskeletal: Strength & Muscle Tone: N/A Gait & Station: N/A Patient leans: N/A  Psychiatric Specialty Exam: Review of Systems  Psychiatric/Behavioral:  Positive for dysphoric mood and sleep disturbance. Negative for agitation, behavioral problems, confusion, decreased concentration, hallucinations, self-injury and suicidal ideas. The patient is nervous/anxious. The patient is not hyperactive.   All other systems reviewed and are negative.   There were no vitals taken for this visit.There is no height or weight on file to calculate BMI.  General Appearance: Well Groomed  Eye Contact:  Good  Speech:  Gutierrez and Coherent  Volume:  Normal  Mood:  Anxious  Affect:  Appropriate, Congruent, and Labile  Thought Process:  Coherent  Orientation:  Full (Time, Place, and Person)  Thought Content: Logical   Suicidal Thoughts:  No  Homicidal Thoughts:  No  Memory:  Immediate;   Good  Judgement:  Good  Insight:  Good  Psychomotor Activity:  Normal  Concentration:  Concentration: Good and Attention Span: Good  Recall:  Good  Fund of Knowledge: Good  Language: Good  Akathisia:  No  Handed:  Right  AIMS (if indicated): not done  Assets:  Communication Skills Desire for Improvement  ADL's:  Intact  Cognition: WNL  Sleep:  Poor   Screenings: GAD-7    Flowsheet Row Patient Outreach Telephone from 11/14/2023 in [place] [facility] Office Visit from 05/09/2023 in [facility] Primary  Care at [facility] Office Visit from 12/27/2022 in [facility] Primary Care at [facility] Video Visit from 11/08/2022 in [facility] Primary Care at [facility] Visit from 09/25/2022 in [facility] Primary Care at [facility]  Total GAD-7 Score 15 15 17 15 12    Mini-Mental    Flowsheet Row Clinical Support from 02/12/2020 in [facility], [facility] Clinical Support from 02/06/2018 in [facility]  Total Score (max 30 points ) 29 30   PHQ2-9    Flowsheet Row Patient Outreach Telephone from 11/14/2023 in [place] [facility] Office Visit from 05/09/2023 in [facility] Primary Care at [facility] Office Visit from 12/27/2022 in [facility] Primary Care at [facility] Video Visit from 11/08/2022 in [facility] Primary Care at [facility] Coordination from 10/15/2022 in [facility] Care Coordination  PHQ-2 Total Score 6 4 6 6 5   PHQ-9 Total Score 19 14 24 22 19    Flowsheet Row Admission (Discharged) from 11/15/2023 in [facility] CARDIAC CATH LAB ED to Hosp-Admission (Discharged) from 11/05/2023 in [facility] Emergency Department at [facility] ED from 05/09/2023 in [facility] Emergency Department at [facility]  C-SSRS RISK CATEGORY No Risk No Risk No Risk     Assessment and Plan:  Yvana Samonte is a 74 y.o. year old female with a history of depression, hypertension, diabetes, GERD, newly diagnosed Afib with RVR, who presents for follow up appointment for below.   1. PTSD (post-traumatic stress disorder) 2. MDD (major depressive disorder), recurrent episode, moderate (HCC) 3. GAD (generalized anxiety disorder) The patient reports chronic joint pain. Psychologically, she describes significant emotional strain, feeling unsupported and isolated. Socially, she states that her husband, son, and grandchildren are mean and hateful toward her and lack empathy or sympathy, though she denies any  history of abuse. She reports limited emotional support and struggles with coping, often feeling overwhelmed and alone in managing her physical and emotional needs. History:  Tx from Dr. Vincente     Notable for less emotional lability, although she understandably has heightened sense of anxiety related to A-fib, and recent admissions.  According to the pharmacist, Dr. Cindie team, it is safe to uptitrate venlafaxine .  While this will be considered in the future, it may not be the best timing until we observe relative stability in her rhythm, in order to avoid complicating the clinical picture.  She agrees to maintain on the current medication regimen.  Will continue venlafaxine  to target PTSD, depression and anxiety.  Will continue BuSpar  for anxiety.  Noted that while she reports worsening sense of her mood symptoms in the context of tapering down this medication, her irritability appears to be more manageable.  Will continue to assess this.  She will greatly benefit from CBT; referral was made and she is on the waiting list.   # insomnia - sleep eval long time ago, reportedly normal.  Unstable.  She complains of middle insomnia and daytime fatigue. Although she was strongly encouraged to consider a sleep evaluation, she declined at this.  Will make the above intervention as outlined above.      Plan Continue venlafaxine  187.5 mg daily- monitor weight gain Continue Buspar  5 mg twice a day-  reduced by 5 mg in May 2025 due to concern of possible adverse reaction.  Next appointment : 9/26 at 9 30, video Referred for therapy, onsite - sent a message to Dr. Cindie whether it is appropriate to uptitrate venlafaxine  prior to this visit - on xanax  0.5 mg at night as needed for anxiety, prescribed by primary care    Past trials of medication: sertraline, citalopram, lexapro , fluoxetine , bupropion  (headache)   The patient demonstrates the following risk factors for suicide: Chronic risk factors for suicide  include: psychiatric disorder of depression and chronic pain. Acute risk factors for suicide include: family or marital conflict. Protective factors for this patient include: hope for the future. Considering these factors, the overall suicide risk at this point appe    Collaboration of Care: Collaboration of Care: Other reviewed notes in Epic  Patient/Guardian was advised Release of Information must be obtained prior to any record release in order to collaborate their care with an outside provider. Patient/Guardian was advised if they have not already done so to contact the registration department to sign all necessary forms in order for us  to release information regarding their care.   Consent: Patient/Guardian gives verbal consent for treatment and assignment of benefits for services provided during this visit. Patient/Guardian expressed understanding and agreed to proceed.    Katheren Sleet, MD 11/20/2023, 2:34 PM

## 2023-11-18 ENCOUNTER — Telehealth: Payer: Self-pay

## 2023-11-18 ENCOUNTER — Other Ambulatory Visit: Payer: Self-pay

## 2023-11-18 ENCOUNTER — Other Ambulatory Visit (HOSPITAL_COMMUNITY): Payer: Self-pay

## 2023-11-18 NOTE — Telephone Encounter (Signed)
-----   Message from Lonni Bolk sent at 11/18/2023  2:21 PM EDT ----- Regarding: FW: tikosyn  question Can someone reach out to see if patient was able to start her Tikosyn  (dofetilide )? ----- Message ----- From: Marzette Kate SQUIBB, CPhT Sent: 10/23/2023   3:50 PM EDT To: Dorothyann ONEIDA Centers, RPH-CPP; Christopher Pav# Subject: tikosyn  question                               Hi!  Catie Centers PharmD gave me your contact name.  I have this patient who outreached me about the cost of Tikosym/Dofetilide . She is coming in on 7/15 Tikosyn  Hospital Admission.  She is Extremely concerned about the cost of the medication after discharge. She informs she called her insurance and spoke to someone who said the medicaiton is note covered. The PharmD at PCP clinic noted it would be $73/month which is cost prohibitive for her and the PharmD also mentioned PAP thru [employer].  Would someone be able to assist her with this to avoid a delay in her therapy?  Thanks,  Jill Simcox, CPhT [facility] Office: [phone] Email: [email or web address]

## 2023-11-18 NOTE — Telephone Encounter (Signed)
 Per details in [facility] a 30 day supply was delivered to patient on 11/01/23. (Fill also confirmed on dispense history report in Epic)

## 2023-11-18 NOTE — Progress Notes (Unsigned)
 11/18/2023 Name: Lisa Gutierrez MRN: [id number] DOB: [birth year]  No chief complaint on file.   Lisa Gutierrez is a 73 y.o. year old female who presented for a telephone visit.   They were referred to the pharmacist by their PCP for assistance in managing diabetes and complex medication management.    Subjective:  Care Team: Primary Care Provider: Gayle Saddie JULIANNA Gutierrez ; Next Scheduled Visit:   Future Appointments  Date Time Provider Department Center  11/18/2023  1:30 PM Pandora Cadet, [facility] CHL-POPH None  11/20/2023  2:00 PM Vickey Mettle, MD ARPA-ARPA None  11/28/2023 10:05 AM Leverne Charlies Helling, PA-C CVD-MAGST H&V  11/29/2023 10:00 AM Gordy Channing LABOR, RN CHL-POPH None  01/28/2024 11:20 AM Gerard Frederick, NP CVD-BURL None    Medication Access/Adherence  [facility] Pharmacy[facility]  [phone] [place], [place] - [address] [place] [address] Phone: [phone] Fax: [phone]  [facility] - [place], [place] - [address], [address], [address] [place] [address] Phone: [phone] Fax: [phone]  [name], [place] - [address] [place] [address] Phone: [phone] Fax: [phone]  [facility] [address] [place] [address] Phone: [phone] Fax: [phone]  [name] [address] [place] [address] Phone: [phone] Fax: [phone]   Patient reports affordability concerns with their medications: No  Patient reports access/transportation concerns to their pharmacy: No  Patient reports adherence concerns with their medications:  Yes  - understanding what medications are for and potential side effects.    Main concerns are understanding the risks of her medications and what each one is for. Has difficulty taking potassium tablets. Would be interested in pill  packaging at some point.    Objective:  Lab Results  Component Value Date   HGBA1C 7.6 (H) 11/06/2023   Lab Results  Component Value Date   HGBA1C 7.6 (H) 11/06/2023   HGBA1C 9.2 (H) 03/26/2023   HGBA1C 9.1 12/27/2022   HGBA1C 7.3 09/25/2022   HGBA1C 7.5 (H) 07/10/2022   HGBA1C 7.7 (H) 08/24/2021   HGBA1C 7.8 (A) 01/03/2021   HGBA1C 7.1 (A) 07/05/2020   HGBA1C 6.6 (A) 02/12/2020   HGBA1C 6.2 (A) 02/10/2019   HGBA1C 6.4 (A) 06/19/2018   HGBA1C 5.9 (A) 12/06/2017    Lab Results  Component Value Date   CREATININE 0.41 (L) 11/07/2023   BUN 8 11/07/2023   NA 139 11/07/2023   K 4.1 11/07/2023   CL 104 11/07/2023   CO2 24 11/07/2023    Lab Results  Component Value Date   CHOL 163 03/26/2023   HDL 24 (L) 03/26/2023   LDLCALC 83 03/26/2023   TRIG 340 (H) 03/26/2023   CHOLHDL 6.8 (H) 03/26/2023    Medications Reviewed Today   Medications were not reviewed in this encounter       Assessment/Plan:   Diabetes: - Currently uncontrolled - Reviewed long term cardiovascular and renal outcomes of uncontrolled blood sugar - Reviewed goal A1c, goal fasting, and goal 2 hour post prandial glucose - Reviewed dietary modifications including low carb diet - Reviewed lifestyle modifications including: exercise as tolerated  - Recommend to check glucose daily first thing in the morning     Medication Management: - Currently strategy insufficient to maintain appropriate adherence to prescribed medication regimen - Suggested use of weekly pill box to organize medications - Discussed  collaboration with local pharmacies for adherence packaging. Reviewed local pharmacies with adherence packaging options. Patient elects to - anticipates eventually going with [facility] pharmacy - we are needing to confirm additional administration times/accuracy of current meds. Agreed on f/u next Monday   - interested in seeing a therapist once more - provided information for [facility]. [phone]  Follow Up Plan: 1wk telephone follow-up  Lang Sieve, PharmD, BCGP Clinical Pharmacist  [phone]

## 2023-11-18 NOTE — Progress Notes (Signed)
   11/18/2023  Patient ID: Lisa Gutierrez, female   DOB: 01-16-1951, 73 y.o.   MRN: [id number]  Attempted to contact patient for scheduled appointment for medication management. Left HIPAA compliant message for patient to return my call at their convenience.   Lang Sieve, PharmD, BCGP Clinical Pharmacist  [phone]

## 2023-11-20 ENCOUNTER — Encounter: Payer: Self-pay | Admitting: Psychiatry

## 2023-11-20 ENCOUNTER — Other Ambulatory Visit (HOSPITAL_COMMUNITY): Payer: Self-pay

## 2023-11-20 ENCOUNTER — Telehealth: Admitting: Psychiatry

## 2023-11-20 DIAGNOSIS — F331 Major depressive disorder, recurrent, moderate: Secondary | ICD-10-CM

## 2023-11-20 DIAGNOSIS — I152 Hypertension secondary to endocrine disorders: Secondary | ICD-10-CM | POA: Diagnosis not present

## 2023-11-20 DIAGNOSIS — E669 Obesity, unspecified: Secondary | ICD-10-CM | POA: Diagnosis not present

## 2023-11-20 DIAGNOSIS — F411 Generalized anxiety disorder: Secondary | ICD-10-CM

## 2023-11-20 DIAGNOSIS — F4310 Post-traumatic stress disorder, unspecified: Secondary | ICD-10-CM

## 2023-11-20 DIAGNOSIS — E1169 Type 2 diabetes mellitus with other specified complication: Secondary | ICD-10-CM | POA: Diagnosis not present

## 2023-11-20 DIAGNOSIS — E1159 Type 2 diabetes mellitus with other circulatory complications: Secondary | ICD-10-CM | POA: Diagnosis not present

## 2023-11-20 DIAGNOSIS — E1165 Type 2 diabetes mellitus with hyperglycemia: Secondary | ICD-10-CM | POA: Diagnosis not present

## 2023-11-20 NOTE — Telephone Encounter (Signed)
 The fill delivered on 11/01/23 was $0.

## 2023-11-21 ENCOUNTER — Telehealth: Payer: Self-pay

## 2023-11-21 NOTE — Progress Notes (Unsigned)
   11/21/2023 Name: Lisa Gutierrez MRN: [id number] DOB: 07-14-1950  Chief Complaint  Patient presents with   Medication Management    Spoke with patient regarding missed appointment and possibly considering pill packaging. Appt RS to 8/11 11am.   Lang Sieve, PharmD, BCGP Clinical Pharmacist  [phone]

## 2023-11-24 ENCOUNTER — Encounter: Payer: Self-pay | Admitting: Cardiology

## 2023-11-25 ENCOUNTER — Other Ambulatory Visit: Payer: Self-pay

## 2023-11-25 ENCOUNTER — Telehealth: Payer: Self-pay | Admitting: *Deleted

## 2023-11-25 NOTE — Telephone Encounter (Signed)
 Contacted pt and scheduled a hospital follow up, pt was not sure if she could make the appt on the 19th and was going to call back and let me know.  We went on and scheduled it so the spot would not get taken.

## 2023-11-25 NOTE — Progress Notes (Signed)
 11/27/2023 Name: Lisa Gutierrez MRN: [id number] DOB: 01-14-1951  Chief Complaint  Patient presents with   Medication Management    Lisa Gutierrez is a 73 y.o. year old female who presented for a telephone visit.   They were referred to the pharmacist by their PCP for assistance in managing medication access and complex medication management.    Subjective:  Care Team: Primary Care Provider: Gayle Saddie JULIANNA Gutierrez ; Next Scheduled Visit:  Future Appointments  Date Time Provider Department Center  11/28/2023 10:05 AM Lisa Charlies Helling, PA-C CVD-MAGST H&V  11/29/2023 10:00 AM Lisa Gutierrez LABOR, RN CHL-POPH None  11/29/2023 11:00 AM Lisa Gutierrez, RPH CHL-POPH None  12/03/2023  1:50 PM Lisa Saddie JULIANNA, PA-C PCFO-PCFO None  01/10/2024  9:30 AM Lisa Mettle, MD ARPA-ARPA None  01/28/2024 11:20 AM Lisa Frederick, NP CVD-BURL None    Medication Access/Adherence  [facility]  [facility] [address] [place], [place] - [address] [place] [address] Phone: [phone] Fax: [phone]  [facility] - [place], [place] - [address], [address], [address] [place] [address] Phone: [phone] Fax: [phone]  [name] - [address] [place] [address] Phone: [phone] Fax: [phone]  [place] [address] [place] [address] Phone: [phone] Fax: [phone]  [name] [address] [place] [address] Phone: [phone] Fax: [phone]   Patient reports affordability concerns with their medications: No  Patient reports access/transportation concerns to their pharmacy: No  Patient reports adherence concerns with their medications:  Yes  remembering when to take   Medication Management:  Current adherence strategy: using local pharmacy, does not feel it has  been a good fit d/t lack of packaging, [facility]   Patient reports Fair adherence to medications  Patient reports the following barriers to adherence: remembering when to take meds/organizing meds. Also has to give two dogs medications, and at times has mixed up meds and taken theirs   Recent fill dates:    Objective:  Lab Results  Component Value Date   HGBA1C 7.6 (H) 11/06/2023    Lab Results  Component Value Date   CREATININE 0.41 (Gutierrez) 11/07/2023   BUN 8 11/07/2023   NA 139 11/07/2023   K 4.1 11/07/2023   CL 104 11/07/2023   CO2 24 11/07/2023    Lab Results  Component Value Date   CHOL 163 03/26/2023   HDL 24 (Gutierrez) 03/26/2023   LDLCALC 83 03/26/2023   TRIG 340 (H) 03/26/2023   CHOLHDL 6.8 (H) 03/26/2023    Medications Reviewed Today     Reviewed by Lisa Gutierrez, [facility] (Pharmacist) on 11/25/23 at 1148  Med List Status: <None>   Medication Order Taking? Sig Documenting Provider Last Dose Status Informant  acetaminophen  (TYLENOL ) 500 MG tablet [id number]  Take 1,000 mg by mouth every 8 (eight) hours as needed for mild pain (pain score 1-3), moderate pain (pain score 4-6) or headache. [provider]  Active Self           Med Note ELSWORTH, Lisa Gutierrez   Tue Sep 10, 2023 11:23 AM)    ALPRAZolam  (XANAX ) 0.5 MG tablet [id number] Yes TAKE 1 TABLET (0.5 MG TOTAL) BY MOUTH AT BEDTIME AS NEEDED. Lisa Toribio POUR, MD  Active Self  apixaban  (ELIQUIS )  5 MG TABS tablet [id number]  Take 1 tablet (5 mg total) by mouth 2 (two) times daily. Riddle, Suzann, NP  Active Self  Blood Glucose Monitoring Suppl (ONE TOUCH ULTRA 2) w/Device KIT [id number]  CHECK IN THE MORNING, AT NOON, AND AT BEDTIME. MAY SUBSTITUTE TO ANY MANUFACTURER COVERED BY PATIENT'S INSURANCE. Lisa Lisa LABOR, PA  Active Self  busPIRone  (BUSPAR ) 5 MG tablet [id number] Yes Take 5 mg by mouth 2 (two) times daily. [provider]  Active Self   Calcium  Carb-Cholecalciferol  (CALCIUM  600/VITAMIN D3) 600-20 MG-MCG TABS [id number]  Take 1 tablet by mouth 2 (two) times daily. [provider]  Active Self  Cholecalciferol  (VITAMIN D3) 50 MCG (2000 UT) capsule [id number]  Take 2,000 Units by mouth daily. [provider]  Active Self  clobetasol  ointment (TEMOVATE ) 0.05 % [id number] Yes Apply topically 2 (two) times daily. Apply topically to affected twice daily until improved.  Patient taking differently: Apply 1 Application topically 2 (two) times daily as needed (progeria nodularis).   Lisa Gutierrez, Lisa E, NP  Active Self           Med Note ELSWORTH, Lisa Gutierrez   Tue Sep 10, 2023 11:23 AM)    digoxin  (LANOXIN ) 0.25 MG tablet [id number] Yes TAKE 1 TABLET BY MOUTH EVERY DAY Hammock, Sheri, NP  Active Self  Docusate Calcium  (STOOL SOFTENER PO) [id number]  Take 2 tablets by mouth at bedtime. [provider]  Active Self  dofetilide  (TIKOSYN ) 500 MCG capsule [id number] Yes Take 1 capsule (500 mcg total) by mouth 2 (two) times daily. Lisa Lisa CROME, NP  Active Self  empagliflozin  (JARDIANCE ) 25 MG TABS tablet [id number] Yes Take 1 tablet (25 mg total) by mouth daily before breakfast. Lisa Lisa LABOR, PA  Active Self           Med Note SIMMIE, RAVEN M   Thu Sep 05, 2023 11:20 AM)    Flaxseed, Linseed, (FLAXSEED OIL) 1400 MG CAPS [id number]  Take 1,400 mg by mouth 2 (two) times daily. [provider]  Active Self  furosemide  (LASIX ) 20 MG tablet [id number] Yes TAKE 1 TABLET BY MOUTH EVERY DAY  Patient taking differently: Take 20 mg by mouth daily as needed for fluid.   Lisa Frederick, NP  Active Self  Glucose Blood (BLOOD GLUCOSE TEST STRIPS) STRP [id number] Yes 1 each by In Vitro route in the morning, at noon, and at bedtime. May substitute to any manufacturer covered by patient's insurance.  Patient taking differently: 1 each by In Vitro route 2 (two) times daily. May substitute to any manufacturer covered by  patient's insurance.   Lisa Lisa LABOR, PA  Active Self  glucose blood test strip [id number]  Use as instructed Lisa Lisa LABOR, PA  Active Self  linaclotide  (LINZESS ) 290 MCG CAPS capsule [id number] Yes Take 1 capsule (290 mcg total) by mouth daily before breakfast. Lisa Toribio POUR, MD  Active Self           Med Note (STEFFENS, MICHELLE P   Thu Nov 14, 2023 12:14 PM)    losartan  (COZAAR ) 25 MG tablet [id number] Yes Take 1 tablet (25 mg total) by mouth daily. Lisa Frederick, NP  Active Self  magnesium  oxide (MAG-OX) 400 MG tablet [id number] Yes Take 1 tablet (400 mg total) by mouth daily. Lisa Lisa CROME, NP  Active Self  metFORMIN  (GLUCOPHAGE -XR) 500 MG 24 hr tablet [id number] Yes TAKE 1 TABLET BY MOUTH TWICE A DAY WITH FOOD Lisa Saddie FALCON, [place]  Active Self  metoprolol  tartrate (LOPRESSOR ) 100 MG tablet [id number] Yes Take 0.5 tablets (50 mg total) by mouth 2 (two) times daily. Croitoru, Mihai, MD  Active   Multiple Vitamins-Minerals (MULTIVITAMIN ADULT PO) [id number]  Take 1 tablet by mouth daily. One a day [provider]  Active Self  pantoprazole  (PROTONIX ) 40 MG tablet [id number] Yes TAKE 1 TABLET BY MOUTH TWICE A DAY Lisa Toribio POUR, MD  Active Self  potassium chloride  SA (KLOR-CON  M) 20 MEQ tablet [id number] Yes Take 1 tablet (20 mEq total) by mouth daily. Lisa Lisa CROME, NP  Active Self  RYBELSUS  14 MG TABS [id number] Yes Take 1 tablet by mouth every morning. [provider]  Active Self  venlafaxine  XR (EFFEXOR -XR) 150 MG 24 hr capsule [id number] Yes Take 1 capsule (150 mg total) by mouth daily with breakfast. Take total of 187.5 mg daily. Take along with 37.5 mg cap Hisada, Katheren, MD  Active Self  venlafaxine  XR (EFFEXOR -XR) 37.5 MG 24 hr capsule [id number] Yes Take 1 capsule (37.5 mg total) by mouth daily with breakfast. Take total of 187.5 mg daily. Take along with 150 mg cap Hisada, Reina, MD  Active Self              Assessment/Plan:  Performed comprehensive  medication review. Obtained administration times for all maintenance meds. We agreed to hold off on transition to [facility] OP pharmacy packing and mail order until cardiology appt later this week due to possibility of med changes. Agreed to followup day after appt.   Patient requested other PCP sites to see if there was a provider closer to her home - one noted is [facility]) - phone number and address listed below.   [address] [place],  [place]  [address] Main: [phone]  Lang Sieve, PharmD, BCGP Clinical Pharmacist  [phone]

## 2023-11-25 NOTE — Telephone Encounter (Signed)
 Copied from CRM #[id number]. Topic: Appointments - Scheduling Inquiry for Clinic >> Nov 25, 2023 10:42 AM Emylou G wrote: Reason for CRM: [facility] called?  Said we are suppose f/u on her no show appt?  She needs to be seen?  Pls review?

## 2023-11-27 NOTE — Telephone Encounter (Signed)
 LVM to confirm that pt will be able to make her appt on 8/19, requested her to call back or send a message to confirm

## 2023-11-28 ENCOUNTER — Ambulatory Visit: Attending: Physician Assistant | Admitting: Physician Assistant

## 2023-11-28 VITALS — BP 120/80 | HR 118 | Ht 65.0 in | Wt 205.0 lb

## 2023-11-28 DIAGNOSIS — I4819 Other persistent atrial fibrillation: Secondary | ICD-10-CM | POA: Diagnosis not present

## 2023-11-28 DIAGNOSIS — D6869 Other thrombophilia: Secondary | ICD-10-CM | POA: Diagnosis not present

## 2023-11-28 DIAGNOSIS — I10 Essential (primary) hypertension: Secondary | ICD-10-CM | POA: Diagnosis not present

## 2023-11-28 DIAGNOSIS — Z79899 Other long term (current) drug therapy: Secondary | ICD-10-CM

## 2023-11-28 DIAGNOSIS — Z5181 Encounter for therapeutic drug level monitoring: Secondary | ICD-10-CM

## 2023-11-28 MED ORDER — AMIODARONE HCL 200 MG PO TABS
200.0000 mg | ORAL_TABLET | Freq: Every day | ORAL | 3 refills | Status: DC
  Filled 2023-12-05: qty 90, 90d supply, fill #0

## 2023-11-28 NOTE — H&P (View-Only) (Signed)
 Cardiology Office Note:  .   Date:  11/28/2023  ID:  [name], DOB 04-25-1950, MRN [id number] PCP: [name]  [facility] Providers Cardiologist:  Deatrice Cage, MD Cardiology APP:  Gerard Frederick, NP  Electrophysiologist:  [name], MD {  History of Present Illness: .   Lisa Gutierrez is a 73 y.o. female w/PMHx of  Anxiety/depression HTN, DM AFib  July 2025, loaded on Tikosyn , did require DCCV, labs/QT stable  ERAF and admitted 11/06/23, felt triggered by viral illness, recommended to allow recovery from her illness and f/u outpt  Saw the AFib clinic team 11/11/23, still in AFib, planned for DCCV  11/15/23: DCCV was successful >>> long pauses > junctional > SB Her metoprolol  dose was reduced to 50mg  BID  Today's visit is scheduled as her 73mo tikosyn  visit ROS:   She thinks she is having side effect from Tikosyn , feels like, since starting it feels off, a bit of brain fog, say dizzy, but not.  She thinks she felt herself go into AFib sometime yesterday or the day prior, noted her HR up to 90's-110s, usually 70's She can tell, makes her tired  She is very emotional today and quite anxious Comments that her mother died in her arms and she is very fearful of dying, and just wants to feel good again.  Seems though perhaps never feeling really well Somewhat hard to narrow in wether or not she felt better post DCCV or even if she felt different. She is a bit tangential and skips around  She has a hard time sleeping Peeling skin fingertips with small wounds on a few, says she saw a dermatologist a while back (at least several months perhaps a year, and told her was likely one her heart medications, and to follow up with her cardiologist, at did not give her any guidance into what medication he felt was the culprit, no treatment recommendations given  Had discussed with Dr. HOLTS and felt to be at this juncture a tikosyn   failure Recommended that we pursue amiodarone  She is weary of amiodarone , has heard some many things and told that prior mediations were used in order to avoid amiodarone  We discussed at length amiodarone , potential off target effects and surveillance strategies to monitor for them  She struggles with anxiety/depression > sees a therapist and doesn't think her current meds are helping her much Also struggles with constipation Coming off Tikosyn  will make managing these less complicated as well for her team   Arrhythmia/AAD hx Tikosyn  started July 2025 AFib ablation 05/02/23  Studies Reviewed: [name]    EKG done today and reviewed by myself:  AFib 118bpm   05/02/23 CONCLUSIONS: 1. Successful PVI 2. Successful ablation/isolation of the posterior wall 3. Intracardiac echo reveals trivial pericardial effusion, normal left atrial architecture 4. No early apparent complications. 5. Colchicine  0.6mg  PO BID x 5 days 6. Protonix  40mg  PO daily x 45 days   Echo 07/10/22   1. Left ventricular ejection fraction, by estimation, is 50 to 55%. The  left ventricle has low normal function. The left ventricle has no regional  wall motion abnormalities. There is mild left ventricular hypertrophy.  Left ventricular diastolic parameters are indeterminate.   2. Right ventricular systolic function is normal. The right ventricular  size is normal. Tricuspid regurgitation signal is inadequate for assessing  PA pressure.   3. Left atrial size was moderately dilated.   4. The mitral valve is normal in structure. No  evidence of mitral valve  regurgitation. No evidence of mitral stenosis.   5. The aortic valve is normal in structure. Aortic valve regurgitation is  not visualized. No aortic stenosis is present.        Risk Assessment/Calculations:    Physical Exam:   VS:  There were no vitals taken for this visit.   Wt Readings from Last 3 Encounters:  11/11/23 205 lb 3.2 oz (93.1 kg)  11/05/23 211 lb  10.3 oz (96 kg)  10/29/23 210 lb 11.2 oz (95.6 kg)    GEN: Well nourished, well developed in no acute distress NECK: No JVD; No carotid bruits CARDIAC: irreg-irreg, no murmurs, rubs, gallops RESPIRATORY:  CTA b/l without rales, wheezing or rhonchi  ABDOMEN: Soft, non-tender, non-distended EXTREMITIES:  No edema; No deformity   ASSESSMENT AND PLAN: .    persistent AFib CHA2DS2Vasc is 4, on Eliquis , appropriately dosed Tikosyn  failure  She reports excellent medication compliance without missing doses Will stop tikosyn  Start amiodarone  on Sunday 8/17 (not clearly noted on her AVS > I called her after she left and she was clear on the instruction from out discussion) 200mg  BID for 2 weeks then daily  Rate is a bit fast here, though the last couple days 90's-110s She does not want to pursue DCCV, ablation, procedures Discussed I think we would need to pursue DCCV She will let us  know how her BP/HR is doing early next week  Baseline LFTs/TSH  HTN Looks ok  Secondary hypercoagulable state 2/2 AFib   Dispo: back in clinic in a month, sooner if needed, pending her home HRs  Signed, Charlies Macario Arthur, PA-C

## 2023-11-28 NOTE — Progress Notes (Signed)
 Cardiology Office Note:  .   Date:  11/28/2023  ID:  Lisa Gutierrez, DOB 04-25-1950, MRN [id number] PCP: Gayle Saddie Lisa Gutierrez  [facility] Providers Cardiologist:  Deatrice Cage, MD Cardiology APP:  Gerard Frederick, NP  Electrophysiologist:  [name], MD {  History of Present Illness: .   Lisa Gutierrez is a 73 y.o. female w/PMHx of  Anxiety/depression HTN, DM AFib  July 2025, loaded on Tikosyn , did require DCCV, labs/QT stable  ERAF and admitted 11/06/23, felt triggered by viral illness, recommended to allow recovery from her illness and f/u outpt  Saw the AFib clinic team 11/11/23, still in AFib, planned for DCCV  11/15/23: DCCV was successful >>> long pauses > junctional > SB Her metoprolol  dose was reduced to 50mg  BID  Today's visit is scheduled as her 73mo tikosyn  visit ROS:   She thinks she is having side effect from Tikosyn , feels like, since starting it feels off, a bit of brain fog, say dizzy, but not.  She thinks she felt herself go into AFib sometime yesterday or the day prior, noted her HR up to 90's-110s, usually 70's She can tell, makes her tired  She is very emotional today and quite anxious Comments that her mother died in her arms and she is very fearful of dying, and just wants to feel good again.  Seems though perhaps never feeling really well Somewhat hard to narrow in wether or not she felt better post DCCV or even if she felt different. She is a bit tangential and skips around  She has a hard time sleeping Peeling skin fingertips with small wounds on a few, says she saw a dermatologist a while back (at least several months perhaps a year, and told her was likely one her heart medications, and to follow up with her cardiologist, at did not give her any guidance into what medication he felt was the culprit, no treatment recommendations given  Had discussed with Dr. HOLTS and felt to be at this juncture a tikosyn   failure Recommended that we pursue amiodarone  She is weary of amiodarone , has heard some many things and told that prior mediations were used in order to avoid amiodarone  We discussed at length amiodarone , potential off target effects and surveillance strategies to monitor for them  She struggles with anxiety/depression > sees a therapist and doesn't think her current meds are helping her much Also struggles with constipation Coming off Tikosyn  will make managing these less complicated as well for her team   Arrhythmia/AAD hx Tikosyn  started July 2025 AFib ablation 05/02/23  Studies Reviewed: [name]    EKG done today and reviewed by myself:  AFib 118bpm   05/02/23 CONCLUSIONS: 1. Successful PVI 2. Successful ablation/isolation of the posterior wall 3. Intracardiac echo reveals trivial pericardial effusion, normal left atrial architecture 4. No early apparent complications. 5. Colchicine  0.6mg  PO BID x 5 days 6. Protonix  40mg  PO daily x 45 days   Echo 07/10/22   1. Left ventricular ejection fraction, by estimation, is 50 to 55%. The  left ventricle has low normal function. The left ventricle has no regional  wall motion abnormalities. There is mild left ventricular hypertrophy.  Left ventricular diastolic parameters are indeterminate.   2. Right ventricular systolic function is normal. The right ventricular  size is normal. Tricuspid regurgitation signal is inadequate for assessing  PA pressure.   3. Left atrial size was moderately dilated.   4. The mitral valve is normal in structure. No  evidence of mitral valve  regurgitation. No evidence of mitral stenosis.   5. The aortic valve is normal in structure. Aortic valve regurgitation is  not visualized. No aortic stenosis is present.        Risk Assessment/Calculations:    Physical Exam:   VS:  There were no vitals taken for this visit.   Wt Readings from Last 3 Encounters:  11/11/23 205 lb 3.2 oz (93.1 kg)  11/05/23 211 lb  10.3 oz (96 kg)  10/29/23 210 lb 11.2 oz (95.6 kg)    GEN: Well nourished, well developed in no acute distress NECK: No JVD; No carotid bruits CARDIAC: irreg-irreg, no murmurs, rubs, gallops RESPIRATORY:  CTA b/l without rales, wheezing or rhonchi  ABDOMEN: Soft, non-tender, non-distended EXTREMITIES:  No edema; No deformity   ASSESSMENT AND PLAN: .    persistent AFib CHA2DS2Vasc is 4, on Eliquis , appropriately dosed Tikosyn  failure  She reports excellent medication compliance without missing doses Will stop tikosyn  Start amiodarone  on Sunday 8/17 (not clearly noted on her AVS > I called her after she left and she was clear on the instruction from out discussion) 200mg  BID for 2 weeks then daily  Rate is a bit fast here, though the last couple days 90's-110s She does not want to pursue DCCV, ablation, procedures Discussed I think we would need to pursue DCCV She will let us  know how her BP/HR is doing early next week  Baseline LFTs/TSH  HTN Looks ok  Secondary hypercoagulable state 2/2 AFib   Dispo: back in clinic in a month, sooner if needed, pending her home HRs  Signed, Charlies Macario Arthur, PA-C

## 2023-11-28 NOTE — Patient Instructions (Signed)
 Medication Instructions:   START TAKING:  AMIODARONE   200 MG  TWICE A DAY FOR 2 WEEKS   THEN RESUME TAKING:   200 MG ONCE A DAY   STOP TAKING AND REMOVE THIS MEDICATION FROM YOUR MEDICATION LIST:  TIKOSYN    *If you need a refill on your cardiac medications before your next appointment, please call your pharmacy*   Lab Work:   PLEASE GO DOWN STAIRS  [facility]  FIRST FLOOR   ( GET OFF ELEVATORS WALK TOWARDS WAITING AREA LAB LOCATED BY PHARMACY): CMET AND TSH TODAY    If you have labs (blood work) drawn today and your tests are completely normal, you will receive your results only by: MyChart Message (if you have MyChart) OR A paper copy in the mail If you have any lab test that is abnormal or we need to change your treatment, we will call you to review the results.  Testing/Procedures:  NONE ORDERED  TODAY    Follow-Up: At [facility], you and your health needs are our priority.  As part of our continuing mission to provide you with exceptional heart care, our providers are all part of one team.  This team includes your primary Cardiologist (physician) and Advanced Practice Providers or APPs (Physician Assistants and Nurse Practitioners) who all work together to provide you with the care you need, when you need it.  Your next appointment:  4 week(s)  Provider:  Daphne Barrack, NP or Charlies Arthur, PA-C ( CONTACT  CASSIE HALL/ ANGELINE HAMMER FOR EP SCHEDULING ISSUES )   We recommend signing up for the patient portal called MyChart.  Sign up information is provided on this After Visit Summary.  MyChart is used to connect with patients for Virtual Visits (Telemedicine).  Patients are able to view lab/test results, encounter notes, upcoming appointments, etc.  Non-urgent messages can be sent to your provider as well.   To learn more about what you can do with MyChart, go to [email or web address].   Other Instructions

## 2023-11-29 ENCOUNTER — Other Ambulatory Visit: Payer: Self-pay

## 2023-11-29 ENCOUNTER — Telehealth: Payer: Self-pay

## 2023-11-29 ENCOUNTER — Ambulatory Visit: Payer: Self-pay | Admitting: Physician Assistant

## 2023-11-29 DIAGNOSIS — E1165 Type 2 diabetes mellitus with hyperglycemia: Secondary | ICD-10-CM

## 2023-11-29 LAB — COMPREHENSIVE METABOLIC PANEL WITH GFR
ALT: 35 IU/L — ABNORMAL HIGH (ref 0–32)
AST: 35 IU/L (ref 0–40)
Albumin: 4.2 g/dL (ref 3.8–4.8)
Alkaline Phosphatase: 75 IU/L (ref 44–121)
BUN/Creatinine Ratio: 14 (ref 12–28)
BUN: 9 mg/dL (ref 8–27)
Bilirubin Total: 0.4 mg/dL (ref 0.0–1.2)
CO2: 20 mmol/L (ref 20–29)
Calcium: 10.3 mg/dL (ref 8.7–10.3)
Chloride: 99 mmol/L (ref 96–106)
Creatinine, Ser: 0.63 mg/dL (ref 0.57–1.00)
Globulin, Total: 3.1 g/dL (ref 1.5–4.5)
Glucose: 162 mg/dL — ABNORMAL HIGH (ref 70–99)
Potassium: 4.7 mmol/L (ref 3.5–5.2)
Sodium: 136 mmol/L (ref 134–144)
Total Protein: 7.3 g/dL (ref 6.0–8.5)
eGFR: 94 mL/min/1.73 (ref >59)

## 2023-11-29 LAB — TSH: TSH: 0.819 u[IU]/mL (ref 0.450–4.500)

## 2023-12-02 ENCOUNTER — Telehealth: Payer: Self-pay | Admitting: Physician Assistant

## 2023-12-02 ENCOUNTER — Encounter: Payer: Self-pay | Admitting: Cardiology

## 2023-12-02 NOTE — Progress Notes (Signed)
   12/02/2023  Patient ID: Lisa Gutierrez, female   DOB: 1950-08-25, 73 y.o.   MRN: [id number]  Finished reviewed of all medications. Would like five in packs through [facility] pharmacy: eliquis , metformin , pantoprazole , rybelsus , losartan . Given frequent changes in cardiology meds and desired changes in meds from psychiatrist, would like to get those in vials for time being.  Message sent to pharmacy to get patient set up with this service.   Lang Sieve, PharmD, BCGP Clinical Pharmacist  [phone]   Future Appointments  Date Time Provider Department Center  12/06/2023 11:00 AM Sieve Lang, [facility] CHL-POPH None  12/10/2023 10:10 AM Gayle Saddie FALCON, PA-C PCFO-PCFO None  01/10/2024  9:30 AM Vickey Mettle, MD ARPA-[facility]PA None  01/28/2024 11:20 AM Gerard Frederick, NP CVD-BURL None

## 2023-12-02 NOTE — Telephone Encounter (Signed)
 Called to f/u on BP/HRs with her  She has monitored BP and HRs 203x/day since our visit HRs 80's-90's, none >100 BPs as well look very good 120-130's/80's (106/77 and 108/73 outliers)  She remains tearful Urged communication with her therapist and PMD She started amiodarone  yesterday as directed  Charlies Arthur, PA-C

## 2023-12-03 ENCOUNTER — Inpatient Hospital Stay

## 2023-12-04 ENCOUNTER — Other Ambulatory Visit (HOSPITAL_COMMUNITY): Payer: Self-pay

## 2023-12-04 ENCOUNTER — Other Ambulatory Visit: Payer: Self-pay

## 2023-12-05 ENCOUNTER — Other Ambulatory Visit: Payer: Self-pay

## 2023-12-05 ENCOUNTER — Other Ambulatory Visit (HOSPITAL_COMMUNITY): Payer: Self-pay

## 2023-12-05 MED ORDER — METOPROLOL TARTRATE 100 MG PO TABS
50.0000 mg | ORAL_TABLET | Freq: Two times a day (BID) | ORAL | 4 refills | Status: DC
Start: 2023-09-15 — End: 2024-01-02
  Filled 2023-12-05: qty 180, 180d supply, fill #0

## 2023-12-05 MED ORDER — HYDROCORTISONE ACETATE 25 MG RE SUPP
25.0000 mg | Freq: Two times a day (BID) | RECTAL | 0 refills | Status: AC
  Filled 2023-12-05: qty 12, 6d supply, fill #0

## 2023-12-05 MED ORDER — RYBELSUS 14 MG PO TABS
14.0000 mg | ORAL_TABLET | Freq: Every day | ORAL | 3 refills | Status: AC
  Filled 2023-12-05: qty 90, 90d supply, fill #0
  Filled 2023-12-09: qty 30, 30d supply, fill #0
  Filled 2024-01-01: qty 90, 90d supply, fill #0
  Filled 2024-02-10 – 2024-05-04 (×3): qty 90, 90d supply, fill #1

## 2023-12-05 MED ORDER — METFORMIN HCL 500 MG PO TABS
500.0000 mg | ORAL_TABLET | Freq: Two times a day (BID) | ORAL | 4 refills | Status: DC
  Filled 2023-12-05: qty 60, 30d supply, fill #0
  Filled 2023-12-09: qty 180, 90d supply, fill #0

## 2023-12-05 MED FILL — Pantoprazole Sodium EC Tab 40 MG (Base Equiv): ORAL | 90 days supply | Qty: 180 | Fill #0 | Status: CN

## 2023-12-05 MED FILL — Losartan Potassium Tab 25 MG: ORAL | 90 days supply | Qty: 90 | Fill #0 | Status: CN

## 2023-12-05 MED FILL — Furosemide Tab 20 MG: ORAL | 30 days supply | Qty: 30 | Fill #0 | Status: CN

## 2023-12-05 MED FILL — Digoxin Tab 250 MCG (0.25 MG): ORAL | 30 days supply | Qty: 30 | Fill #0 | Status: CN

## 2023-12-05 MED FILL — Metformin HCl Tab ER 24HR 500 MG: ORAL | 30 days supply | Qty: 60 | Fill #0 | Status: CN

## 2023-12-06 ENCOUNTER — Other Ambulatory Visit: Payer: Self-pay

## 2023-12-06 ENCOUNTER — Other Ambulatory Visit (HOSPITAL_COMMUNITY): Payer: Self-pay

## 2023-12-06 NOTE — Telephone Encounter (Signed)
 Called patient with the following recommendations: Unusual but not impossible to get some GI intolerance/nausea from amiodarone  though usually on much larger dosing. Please reduce amiodarone  to once daily. Agree, no Zofran  please.   Have her see the AFib clinic or EP APP in 65mo please, I dont see she got an appt yet   Renee  I informed patient I will route message to our scheduling team to set appointment up. Patient verbalized understanding.  Josie RN

## 2023-12-06 NOTE — Progress Notes (Signed)
   12/06/2023 Name: Lisa Gutierrez MRN: [id number] DOB: 1950-06-08  Chief Complaint  Patient presents with   Medication Management   Called on patient to check in medications and transition to [facility] OP pharmacy for pill packaging and mail order. Ensure that patient had appropriate contact information and she plans to reach out to them this morning.   No medication changes noted since last visit. Has ongoing n/v after starting amiodarone , awaiting response from cardiology to see if there is something that might be safe considering going arrhythmias and risk of Qtc prolongation.   Confirmed that patient has my contact information as well as pharmacy's moving forward should she have any medication related questions.   Future Appointments  Date Time Provider Department Center  12/06/2023 11:00 AM Pandora Cadet, [facility] CHL-POPH None  12/10/2023 10:10 AM Gayle Saddie FALCON, PA-C PCFO-PCFO None  01/10/2024  9:30 AM Vickey Mettle, MD [facility] None  01/28/2024 11:20 AM Gerard Frederick, NP CVD-BURL None     Cadet Pandora, PharmD, BCGP Clinical Pharmacist  [phone]

## 2023-12-09 ENCOUNTER — Other Ambulatory Visit: Payer: Self-pay

## 2023-12-09 ENCOUNTER — Other Ambulatory Visit (HOSPITAL_COMMUNITY): Payer: Self-pay

## 2023-12-09 ENCOUNTER — Other Ambulatory Visit: Payer: Self-pay | Admitting: Cardiology

## 2023-12-09 DIAGNOSIS — I4891 Unspecified atrial fibrillation: Secondary | ICD-10-CM

## 2023-12-09 MED ORDER — APIXABAN 5 MG PO TABS
5.0000 mg | ORAL_TABLET | Freq: Two times a day (BID) | ORAL | 5 refills | Status: DC
  Filled ????-??-??: fill #0

## 2023-12-09 MED FILL — Metformin HCl Tab ER 24HR 500 MG: ORAL | 30 days supply | Qty: 60 | Fill #0 | Status: CN

## 2023-12-09 MED FILL — Losartan Potassium Tab 25 MG: ORAL | 30 days supply | Qty: 30 | Fill #0 | Status: CN

## 2023-12-09 MED FILL — Pantoprazole Sodium EC Tab 40 MG (Base Equiv): ORAL | 30 days supply | Qty: 60 | Fill #0 | Status: CN

## 2023-12-09 NOTE — Telephone Encounter (Signed)
 Prescription refill request for Eliquis  received. Indication: PAF Last office visit: 11/28/23 JONELLE Arthur PA-C Scr: 0.63 on 11/28/23  Epic Age: 73 Weight: 93kg  Based on above findings Eliquis  5mg  twice daily is the appropriate dose.  Refill approved.

## 2023-12-10 ENCOUNTER — Inpatient Hospital Stay

## 2023-12-11 ENCOUNTER — Telehealth: Payer: Self-pay | Admitting: *Deleted

## 2023-12-11 NOTE — Telephone Encounter (Signed)
 Lvm to call direct number [phone] for reccomendations

## 2023-12-11 NOTE — Telephone Encounter (Signed)
 Call x1; lvmtcb and MyChart message sent to pt, to schedule w/ AFC.   Stacy - anything we can do this week per Renee's request?

## 2023-12-11 NOTE — Telephone Encounter (Signed)
 Spoke w/ patient after she returned my call - she hasn't stopped the amiodarone  yet, but states after 3 days of the itching, the itching stopped. Patient was very emotional, stating that she hasn't felt well in 2 years, just wants to know when she will feel well. She states that physically and mentally, it has been hard. She states she just wants to clean her house, go do things like eat lunch, etc. She states that she is just really tired of it all. I advised of Ricky's first available on 9/5 at 130 - she took the appt but requested something tomorrow as her husband is off on Tues/Thurs. Advised I would pass along the message but no promises on an opening tomorrow.

## 2023-12-13 ENCOUNTER — Other Ambulatory Visit: Payer: Self-pay

## 2023-12-13 NOTE — Progress Notes (Signed)
   12/13/2023  Patient ID: Lisa Gutierrez, female   DOB: 1951-01-19, 73 y.o.   MRN: [id number]  Medication management f/u: At this point has everything confirmed with [facility] OP pharmacy, reviewed contact information for pharmacy/process to get things into packaging. Once its is understood that medication will be long term, she will let the pharmacy know.   Continues to feel fatigued and not like herself. Has appt with afib clinic 12/20/2023 to discuss next steps. Had some itchiness on amiodarone , remains on for the time being.    Future Appointments  Date Time Provider Department Center  12/20/2023  1:30 PM Nellene Quita SAUNDERS, [place] [facility]-AFIB H&V  12/23/2023 11:00 AM Pandora Cadet, [facility] CHL-POPH None  01/02/2024 10:50 AM [name] [facility]  01/10/2024  9:30 AM Vickey Mettle, MD [facility] None  01/28/2024 11:20 AM Gerard Frederick, NP CVD-BURL None   Cadet Pandora, PharmD, BCGP Clinical Pharmacist  [phone]

## 2023-12-20 ENCOUNTER — Ambulatory Visit (HOSPITAL_COMMUNITY)
Admission: RE | Admit: 2023-12-20 | Discharge: 2023-12-20 | Disposition: A | Discharge status: Home / Self Care | Source: Ambulatory Visit | Attending: Physician Assistant | Admitting: Physician Assistant

## 2023-12-20 ENCOUNTER — Encounter (HOSPITAL_COMMUNITY): Payer: Self-pay | Admitting: Physician Assistant

## 2023-12-20 VITALS — BP 116/90 | HR 74 | Ht 65.0 in | Wt 205.8 lb

## 2023-12-20 DIAGNOSIS — D6869 Other thrombophilia: Secondary | ICD-10-CM | POA: Diagnosis not present

## 2023-12-20 DIAGNOSIS — Z5181 Encounter for therapeutic drug level monitoring: Secondary | ICD-10-CM | POA: Diagnosis not present

## 2023-12-20 DIAGNOSIS — I4819 Other persistent atrial fibrillation: Secondary | ICD-10-CM | POA: Diagnosis not present

## 2023-12-20 DIAGNOSIS — Z79899 Other long term (current) drug therapy: Secondary | ICD-10-CM | POA: Diagnosis not present

## 2023-12-20 NOTE — Progress Notes (Signed)
 Primary Care Physician: Gayle Saddie FALCON, PA-C Primary Cardiologist: Deatrice Cage, MD Electrophysiologist: [name], MD  Referring Physician: Dr HOLTS Lisa Gutierrez is a 73 y.o. female with a history of HTN, DM, atrial fibrillation who presents for follow up in the [facility]ibr[facility].  The patient is s/p afib ablation 05/02/23. She had afib post ablation and underwent DCCV 06/03/23. However, she was seen by Dr HOLTS and was back in afib again, dofetilide  recommended. Patient is on Eliquis  for stroke prevention. She is s/p dofetilide  loading 7/15-7/18/25 with DCCV on 10/31/23. She presented back to the ED 11/05/23 with fever, weakness, and diarrhea. She was also back in afib at that time. DCCV was deferred to allow time to recover from her acute illness. She had DCCV on 8/1 but had quick return of afib again. Considered dofetilide  failure, loaded on amiodarone .    Patient returns for follow up for atrial fibrillation and amiodarone  monitoring. She did have systemic itching after starting amiodarone  but this subsided after 3 days. She continues to have fatigue and intermittent dizziness despite rate control. No bleeding issues.   Today, she  denies symptoms of palpitations, chest pain, shortness of breath, orthopnea, PND, lower extremity edema, presyncope, syncope, snoring, daytime somnolence, bleeding, or neurologic sequela. The patient is tolerating medications without difficulties and is otherwise without complaint today.    Atrial Fibrillation Risk Factors:  she does not have symptoms or diagnosis of sleep apnea. she does not have a history of rheumatic fever.   Atrial Fibrillation Management history:  Previous antiarrhythmic drugs: dofetilide , amiodarone  Previous cardioversions: 09/03/22, 06/03/23, 10/31/23, 11/15/23 Previous ablations: 05/02/23 Anticoagulation history: Eliquis   ROS- All systems are reviewed and negative except as per the HPI  above.  Past Medical History:  Diagnosis Date   Anxiety    Atrial fibrillation (HCC)    Depression    Depression    Phreesia 07/02/2020   Diabetes mellitus without complication (HCC)    GERD (gastroesophageal reflux disease)    Hypertension    IBS (irritable bowel syndrome)    Neuromuscular disorder (HCC)     Current Outpatient Medications  Medication Sig Dispense Refill   acetaminophen  (TYLENOL ) 500 MG tablet Take 1,000 mg by mouth every 8 (eight) hours as needed for mild pain (pain score 1-3), moderate pain (pain score 4-6) or headache.     ALPRAZolam  (XANAX ) 0.5 MG tablet TAKE 1 TABLET (0.5 MG TOTAL) BY MOUTH AT BEDTIME AS NEEDED. 30 tablet 0   amiodarone  (PACERONE ) 200 MG tablet Take 1 tablet (200 mg total) by mouth daily. 90 tablet 3   apixaban  (ELIQUIS ) 5 MG TABS tablet Take 1 tablet (5 mg total) by mouth 2 (two) times daily. 60 tablet 5   Blood Glucose Monitoring Suppl (ONE TOUCH ULTRA 2) w/Device KIT CHECK IN THE MORNING, AT NOON, AND AT BEDTIME. MAY SUBSTITUTE TO ANY MANUFACTURER COVERED BY PATIENT'S INSURANCE. 300 kit 3   busPIRone  (BUSPAR ) 5 MG tablet Take 5 mg by mouth 2 (two) times daily.     Calcium  Carb-Cholecalciferol  (CALCIUM  600/VITAMIN D3) 600-20 MG-MCG TABS Take 1 tablet by mouth 2 (two) times daily.     Cholecalciferol  (VITAMIN D3) 50 MCG (2000 UT) capsule Take 2,000 Units by mouth daily.     clobetasol  ointment (TEMOVATE ) 0.05 % Apply twice daily to affected areas on hands until improved then as needed for flares. 30 g 2   digoxin  (LANOXIN ) 0.25 MG tablet Take 1 tablet (250 mcg total)  by mouth daily. 90 tablet 2   Docusate Calcium  (STOOL SOFTENER PO) Take 2 tablets by mouth at bedtime.     empagliflozin  (JARDIANCE ) 25 MG TABS tablet Take 1 tablet (25 mg total) by mouth daily before breakfast. 90 tablet 1   Flaxseed, Linseed, (FLAXSEED OIL) 1400 MG CAPS Take 1,400 mg by mouth 2 (two) times daily.     furosemide  (LASIX ) 20 MG tablet Take 1 tablet (20 mg total) by  mouth daily. 30 tablet 6   Glucose Blood (BLOOD GLUCOSE TEST STRIPS) STRP 1 each by In Vitro route in the morning, at noon, and at bedtime. May substitute to any manufacturer covered by patient's insurance. (Patient taking differently: 1 each by In Vitro route 2 (two) times daily. May substitute to any manufacturer covered by patient's insurance.) 100 strip 9   glucose blood test strip Use in the morning, at noon, and at bedtime. 100 each 12   hydrocortisone  (ANUSOL -HC) 25 MG suppository Place 1 suppository (25 mg total) rectally 2 (two) times daily. 12 suppository 0   linaclotide  (LINZESS ) 290 MCG CAPS capsule Take 1 capsule (290 mcg total) by mouth daily before breakfast. 30 capsule 2   losartan  (COZAAR ) 25 MG tablet Take 1 tablet (25 mg total) by mouth daily. 90 tablet 3   magnesium  oxide (MAG-OX) 400 MG tablet Take 1 tablet (400 mg total) by mouth daily. 30 tablet 6   metFORMIN  (GLUCOPHAGE ) 500 MG tablet Take 1 tablet (500 mg total) by mouth 2 (two) times daily with a meal. 180 tablet 4   metFORMIN  (GLUCOPHAGE -XR) 500 MG 24 hr tablet Take 1 tablet (500 mg total) by mouth 2 (two) times daily with food. 60 tablet 1   metoprolol  tartrate (LOPRESSOR ) 100 MG tablet Take 1 tablet (100 mg total) by mouth 2 (two) times daily. 180 tablet 3   metoprolol  tartrate (LOPRESSOR ) 100 MG tablet Take 0.5 tablets (50 mg total) by mouth 2 (two) times daily. 180 tablet 4   Multiple Vitamins-Minerals (MULTIVITAMIN ADULT PO) Take 1 tablet by mouth daily. One a day     pantoprazole  (PROTONIX ) 40 MG tablet Take 1 tablet (40 mg total) by mouth 2 (two) times daily. 180 tablet 1   potassium chloride  SA (KLOR-CON  M) 20 MEQ tablet Take 1 tablet (20 mEq total) by mouth daily. 30 tablet 6   RYBELSUS  14 MG TABS Take 1 tablet by mouth every morning.     Semaglutide  (RYBELSUS ) 14 MG TABS Take 1 tablet (14 mg total) by mouth daily. Do not cut, crush, or chew. 90 tablet 3   venlafaxine  XR (EFFEXOR -XR) 150 MG 24 hr capsule Take 1  capsule (150 mg total) by mouth daily with breakfast. Take total of 187.5 mg daily. Take along with 37.5 mg cap 90 capsule 0   venlafaxine  XR (EFFEXOR -XR) 37.5 MG 24 hr capsule Take 1 capsule (37.5 mg total) by mouth daily with breakfast. Take total of 187.5 mg daily. Take along with 150 mg cap 90 capsule 0   No current facility-administered medications for this encounter.    Physical Exam: BP (!) 116/90   Pulse 74   Ht 5' 5 (1.651 m)   Wt 93.4 kg   BMI 34.25 kg/m   GEN: Well nourished, well developed in no acute distress CARDIAC: Irregularly irregular rate and rhythm, no murmurs, rubs, gallops RESPIRATORY:  Clear to auscultation without rales, wheezing or rhonchi  ABDOMEN: Soft, non-tender, non-distended EXTREMITIES:  No edema; No deformity    Wt Readings from Last  3 Encounters:  12/20/23 93.4 kg  11/28/23 93 kg  11/11/23 93.1 kg     EKG today demonstrates  Coarse afib vs atypical atrial flutter with variable block Vent. rate 74 BPM PR interval * ms QRS duration 86 ms QT/QTcB 360/399 ms   Echo 07/10/22 demonstrated   1. Left ventricular ejection fraction, by estimation, is 50 to 55%. The  left ventricle has low normal function. The left ventricle has no regional  wall motion abnormalities. There is mild left ventricular hypertrophy.  Left ventricular diastolic parameters are indeterminate.   2. Right ventricular systolic function is normal. The right ventricular  size is normal. Tricuspid regurgitation signal is inadequate for assessing  PA pressure.   3. Left atrial size was moderately dilated.   4. The mitral valve is normal in structure. No evidence of mitral valve  regurgitation. No evidence of mitral stenosis.   5. The aortic valve is normal in structure. Aortic valve regurgitation is  not visualized. No aortic stenosis is present.    CHA2DS2-VASc Score = 4  The patient's score is based upon: CHF History: 0 HTN History: 1 Diabetes History: 1 Stroke  History: 0 Vascular Disease History: 0 Age Score: 1 Gender Score: 1       ASSESSMENT AND PLAN: Persistent Atrial Fibrillation (ICD10:  I48.19) The patient's CHA2DS2-VASc score is 4, indicating a 4.8% annual risk of stroke.   S/p afib ablation 05/02/23 Failed to maintain SR with dofetilide , loaded on amiodarone . We discussed rhythm control options today. Will plan for DCCV. Check bmet/cbc. Long term, she is considering repeat ablation, will have her f/u with Dr Cindie after DCCV to discuss.  Continue amiodarone  200 mg daily Continue Eliquis  5 mg BID Continue digoxin  0.25 mg daily Continue Lopressor  100 mg BID  Secondary Hypercoagulable State (ICD10:  D68.69) The patient is at significant risk for stroke/thromboembolism based upon her CHA2DS2-VASc Score of 4.  Continue Apixaban  (Eliquis ). No bleeding issues.   High Risk Medication Monitoring (ICD 10: J342684) Patient requires ongoing monitoring for anti-arrhythmic medication which has the potential to cause life threatening arrhythmias. Intervals on ECG acceptable for amiodarone  monitoring.   HTN Stable on current regimen  Obesity Body mass index is 34.25 kg/m.  Encouraged lifestyle modification   Follow up in the AF clinic post DCCV and then with Dr Cindie.    Informed Consent   Shared Decision Making/Informed Consent The risks (stroke, cardiac arrhythmias rarely resulting in the need for a temporary or permanent pacemaker, skin irritation or burns and complications associated with conscious sedation including aspiration, arrhythmia, respiratory failure and death), benefits (restoration of normal sinus rhythm) and alternatives of a direct current cardioversion were explained in detail to Ms. Jim and she agrees to proceed.       [facility] [address] [place], [place] [address] [phone]

## 2023-12-20 NOTE — Patient Instructions (Addendum)
   Hold below medications 72 hours prior to scheduled procedure/anesthesia. Restart medication on the following day after scheduled procedure/anesthesia  Empagliflozin  (Jardiance )- HOLD AS OF SATURDAY SEPT 6th     Hold below medications 24 hours prior to scheduled procedure/anesthesia.   Restart medication on the following day after scheduled procedure/anesthesia  Semaglutide  (Rybelsus ) - HOLD AS OF MONDAY SEPT 8th     Cardioversion scheduled for: Tuesday, September 9th   - Arrive at the [facility] ([address]  and check in with ADMITTING at 10:30 AM   - Do not eat or drink anything after midnight the night prior to your procedure.   - Take all your morning medication (except diabetic medications) with a sip of water prior to arrival.  - Do NOT miss any doses of your blood thinner - if you should miss a dose or take a dose more than 4 hours late -- please notify our office immediately.  - You will not be able to drive home after your procedure. Please ensure you have a responsible adult to drive you home. You will need someone with you for 24 hours post procedure.     - Expect to be in the procedural area approximately 2 hours.   - If you feel as if you go back into normal rhythm prior to scheduled cardioversion, please notify our office immediately.   If your procedure is canceled in the cardioversion suite you will be charged a cancellation fee.

## 2023-12-21 LAB — CBC
Hematocrit: 41.6 % (ref 34.0–46.6)
Hemoglobin: 13.5 g/dL (ref 11.1–15.9)
MCH: 29.7 pg (ref 26.6–33.0)
MCHC: 32.5 g/dL (ref 31.5–35.7)
MCV: 92 fL (ref 79–97)
Platelets: 250 x10E3/uL (ref 150–450)
RBC: 4.54 x10E6/uL (ref 3.77–5.28)
RDW: 13 % (ref 11.7–15.4)
WBC: 9 x10E3/uL (ref 3.4–10.8)

## 2023-12-23 ENCOUNTER — Ambulatory Visit (HOSPITAL_COMMUNITY): Payer: Self-pay | Admitting: Physician Assistant

## 2023-12-23 ENCOUNTER — Other Ambulatory Visit: Payer: Self-pay

## 2023-12-23 ENCOUNTER — Telehealth: Payer: Self-pay

## 2023-12-23 ENCOUNTER — Other Ambulatory Visit (HOSPITAL_COMMUNITY): Payer: Self-pay | Admitting: *Deleted

## 2023-12-23 NOTE — Telephone Encounter (Signed)
 Cardioversion has been rescheduled. Patient instructions reviewed regarding holding jardiance  and rybelsus  prior to procedure.

## 2023-12-23 NOTE — Progress Notes (Signed)
   12/23/2023  Patient ID: Lisa Gutierrez, female   DOB: 29-Aug-1950, 73 y.o.   MRN: [id number]  Reviewed medication related needs for patient - no changes other than hold instructions for jardiance  (3 days), Rybelsus  (1 day) prior to upcoming DCCV, now scheduled for Thursday 12/26/23.  She is inquiring on venlafaxine  ER 150mg , last sold 09/08/23. I sent message to [facility].   Future Appointments  Date Time Provider Department Center  12/30/2023  2:00 PM Pandora Cadet, [facility]OP[facility] None  01/02/2024 10:50 AM [name] [facility]  01/10/2024  9:30 AM Vickey Mettle, MD [facility] None  01/15/2024  3:00 PM Cindie Ole DASEN, MD [facility] None  01/28/2024 11:20 AM Gerard Frederick, NP [facility] None  03/26/2024  1:00 PM PCFO-ANNUAL WELLNESS VISIT [facility]   Agreed on 1 week f/u to assist in any medication related needs.   Cadet Pandora, PharmD, BCGP Clinical Pharmacist  [phone]

## 2023-12-23 NOTE — Progress Notes (Signed)
   12/23/2023  Patient ID: [name], female   DOB: 06-03-1950, 73 y.o.   MRN: [id number]  Spoke with pharmacy and spoke with patient regarding venlafaxine  ER 150mg  - fill in progress, expected to receive tomorrow 12/24/2023. Patient aware and okay with plan. No other medication related needs at this time.   Lang Sieve, PharmD, BCGP Clinical Pharmacist  [phone]

## 2023-12-24 DIAGNOSIS — I4819 Other persistent atrial fibrillation: Secondary | ICD-10-CM

## 2023-12-25 NOTE — Progress Notes (Signed)
 Spoke to patient and instructed them to come at 0800  and to be NPO after 0000.     Confirmed that patient will have a ride home and someone to stay with them for 24 hours after the procedure.   Medications reviewed.  Confirmed blood thinner.  Confirmed no breaks in taking blood thinner for 3+ weeks prior to procedure. Confirmed patient stopped all GLP-1s and GLP-2s for at least one week before procedure.

## 2023-12-26 ENCOUNTER — Ambulatory Visit (HOSPITAL_COMMUNITY)
Admission: RE | Admit: 2023-12-26 | Discharge: 2023-12-26 | Disposition: A | Discharge status: Home / Self Care | Attending: Internal Medicine | Admitting: Internal Medicine

## 2023-12-26 ENCOUNTER — Ambulatory Visit (HOSPITAL_COMMUNITY): Admitting: Anesthesiology

## 2023-12-26 ENCOUNTER — Other Ambulatory Visit: Payer: Self-pay

## 2023-12-26 ENCOUNTER — Encounter (HOSPITAL_COMMUNITY): Payer: Self-pay | Admitting: Internal Medicine

## 2023-12-26 ENCOUNTER — Encounter (HOSPITAL_COMMUNITY)
Admission: RE | Disposition: A | Discharge status: Home / Self Care | Payer: Self-pay | Source: Home / Self Care | Attending: Internal Medicine

## 2023-12-26 DIAGNOSIS — Z87891 Personal history of nicotine dependence: Secondary | ICD-10-CM | POA: Insufficient documentation

## 2023-12-26 DIAGNOSIS — F418 Other specified anxiety disorders: Secondary | ICD-10-CM | POA: Diagnosis not present

## 2023-12-26 DIAGNOSIS — I4891 Unspecified atrial fibrillation: Secondary | ICD-10-CM | POA: Diagnosis not present

## 2023-12-26 DIAGNOSIS — I119 Hypertensive heart disease without heart failure: Secondary | ICD-10-CM | POA: Diagnosis not present

## 2023-12-26 DIAGNOSIS — Z7901 Long term (current) use of anticoagulants: Secondary | ICD-10-CM | POA: Diagnosis not present

## 2023-12-26 DIAGNOSIS — I4819 Other persistent atrial fibrillation: Secondary | ICD-10-CM | POA: Diagnosis not present

## 2023-12-26 DIAGNOSIS — I10 Essential (primary) hypertension: Secondary | ICD-10-CM | POA: Diagnosis not present

## 2023-12-26 DIAGNOSIS — E119 Type 2 diabetes mellitus without complications: Secondary | ICD-10-CM | POA: Insufficient documentation

## 2023-12-26 DIAGNOSIS — Z79899 Other long term (current) drug therapy: Secondary | ICD-10-CM | POA: Insufficient documentation

## 2023-12-26 DIAGNOSIS — D6869 Other thrombophilia: Secondary | ICD-10-CM | POA: Diagnosis not present

## 2023-12-26 DIAGNOSIS — K219 Gastro-esophageal reflux disease without esophagitis: Secondary | ICD-10-CM | POA: Insufficient documentation

## 2023-12-26 DIAGNOSIS — Z006 Encounter for examination for normal comparison and control in clinical research program: Secondary | ICD-10-CM

## 2023-12-26 HISTORY — PX: CARDIOVERSION: EP1203

## 2023-12-26 MED ORDER — PROPOFOL 10 MG/ML IV BOLUS
INTRAVENOUS | Status: DC | PRN
Start: 2023-12-26 — End: 2023-12-26
  Administered 2023-12-26: 90 mg via INTRAVENOUS

## 2023-12-26 MED ORDER — LIDOCAINE 2% (20 MG/ML) 5 ML SYRINGE
INTRAMUSCULAR | Status: DC | PRN
  Administered 2023-12-26: 100 mg via INTRAVENOUS

## 2023-12-26 MED ORDER — SODIUM CHLORIDE 0.9 % IV SOLN: INTRAVENOUS | Status: DC

## 2023-12-26 NOTE — Transfer of Care (Signed)
 Immediate Anesthesia Transfer of Care Note  Patient: Lisa Gutierrez  Procedure(s) Performed: CARDIOVERSION  Patient Location: PACU  Anesthesia Type:General  Level of Consciousness: drowsy  Airway & Oxygen Therapy: Patient Spontanous Breathing and Patient connected to nasal cannula oxygen  Post-op Assessment: Report given to RN and Post -op Vital signs reviewed and stable  Post vital signs: Reviewed and stable  Last Vitals:  Vitals Value Taken Time  BP    Temp    Pulse    Resp    SpO2      Last Pain:  Vitals:   12/26/23 0852  TempSrc:   PainSc: 0-No pain         Complications: No notable events documented.

## 2023-12-26 NOTE — Interval H&P Note (Signed)
 History and Physical Interval Note:  12/26/2023 9:01 AM  Lisa Gutierrez  has presented today for surgery, with the diagnosis of AFIB.  The various methods of treatment have been discussed with the patient and family. After consideration of risks, benefits and other options for treatment, the patient has consented to  Procedure(s): CARDIOVERSION (N/A) as a surgical intervention.  The patient's history has been reviewed, patient examined, no change in status, stable for surgery.  I have reviewed the patient's chart and labs.  Questions were answered to the patient's satisfaction.  Did not take metoprolol  today.  Rest as below.   Fatih Stalvey A Maleya Leever

## 2023-12-26 NOTE — Anesthesia Preprocedure Evaluation (Signed)
 Anesthesia Evaluation  Patient identified by MRN, date of birth, ID band Patient awake    Reviewed: Allergy & Precautions, NPO status , Patient's Chart, lab work & pertinent test results  History of Anesthesia Complications Negative for: history of anesthetic complications  Airway Mallampati: II  TM Distance: >3 FB Neck ROM: Full    Dental no notable dental hx. (+) Teeth Intact   Pulmonary neg sleep apnea, neg COPD, Patient abstained from smoking.Not current smoker, former smoker   Pulmonary exam normal breath sounds clear to auscultation       Cardiovascular Exercise Tolerance: Good METShypertension, Pt. on medications (-) CAD and (-) Past MI + dysrhythmias Atrial Fibrillation  Rhythm:Irregular Rate:Normal - Systolic murmurs    Neuro/Psych  PSYCHIATRIC DISORDERS Anxiety Depression       GI/Hepatic ,GERD  Medicated,,(+)     (-) substance abuse    Endo/Other  diabetes  Oral daily GLP1 rybelsus  last taken yesterday. Denies gi symptoms today  Renal/GU negative Renal ROS     Musculoskeletal   Abdominal  (+) + obese  Peds  Hematology   Anesthesia Other Findings Past Medical History: No date: Anxiety No date: Atrial fibrillation (HCC) No date: Depression No date: Depression     Comment:  [employer]rees[employer] 07/02/2020 No date: Diabetes mellitus without complication (HCC) No date: GERD (gastroesophageal reflux disease) No date: Hypertension No date: IBS (irritable bowel syndrome) No date: Neuromuscular disorder (HCC)  Reproductive/Obstetrics                              Anesthesia Physical Anesthesia Plan  ASA: 3  Anesthesia Plan: MAC and General   Post-op Pain Management: Minimal or no pain anticipated   Induction: Intravenous  PONV Risk Score and Plan: 3 and Propofol  infusion, TIVA and Ondansetron   Airway Management Planned: Nasal Cannula  Additional Equipment: None  Intra-op Plan:    Post-operative Plan:   Informed Consent: I have reviewed the patients History and Physical, chart, labs and discussed the procedure including the risks, benefits and alternatives for the proposed anesthesia with the patient or authorized representative who has indicated his/her understanding and acceptance.     Dental advisory given  Plan Discussed with: CRNA and Surgeon  Anesthesia Plan Comments: (Discussed risks of anesthesia with patient, including possibility of difficulty with spontaneous ventilation under anesthesia necessitating airway intervention, PONV, and rare risks such as cardiac or respiratory or neurological events, and allergic reactions. Discussed the role of CRNA in patient's perioperative care. Patient understands.)         Anesthesia Quick Evaluation

## 2023-12-26 NOTE — CV Procedure (Signed)
    Electrical Cardioversion Procedure Note Lisa Gutierrez [id number] 1951/03/17  Procedure: Electrical Cardioversion Indications:  Atrial Fibrillation  Time Out: Verified patient identification, verified procedure,medications/allergies/relevent history reviewed, required imaging and test results available.  Performed  Procedure Details  The patient was NPO after midnight. Anesthesia was administered at the beside  by Dr.Zak.  Cardioversion was done with synchronized biphasic defibrillation with AP pads with 200 Joules.  The patient converted to sinus rhythm with first degree heart block. The patient tolerated the procedure well.  IMPRESSION:  Successful cardioversion of atrial fibrillation.   Stanly Leavens, MD FASE [facility] Cardiologist [facility]  [address] [place], [address] [place], [place] [address] [phone]  9:24 AM

## 2023-12-26 NOTE — Anesthesia Postprocedure Evaluation (Signed)
 Anesthesia Post Note  Patient: Lisa Gutierrez  Procedure(s) Performed: CARDIOVERSION     Patient location during evaluation: Cath Lab Anesthesia Type: General Level of consciousness: awake and alert Pain management: pain level controlled Vital Signs Assessment: post-procedure vital signs reviewed and stable Respiratory status: spontaneous breathing, nonlabored ventilation, respiratory function stable and patient connected to nasal cannula oxygen Cardiovascular status: blood pressure returned to baseline and stable Postop Assessment: no apparent nausea or vomiting Anesthetic complications: no   No notable events documented.  Last Vitals:  Vitals:   12/26/23 0843  BP: (!) 156/104  Pulse: 87  Resp: 15  Temp: 36.6 C  SpO2: 96%    Last Pain:  Vitals:   12/26/23 0852  TempSrc:   PainSc: 0-No pain                 [name]

## 2023-12-26 NOTE — Research (Signed)
 [facility] Cardioversion Informed Consent   Subject Name: Lisa Gutierrez  Subject met inclusion and exclusion criteria.  The informed consent form, study requirements and expectations were reviewed with the subject and questions and concerns were addressed prior to the signing of the consent form.  The subject verbalized understanding of the trial requirements.  The subject agreed to participate in the [facility] Cardioversion trial and signed the informed consent at 0840 on 11/Sep/2025.  The informed consent was obtained prior to performance of any protocol-specific procedures for the subject.  A copy of the signed informed consent was given to the subject and a copy was placed in the subject's medical record.   [name]

## 2023-12-27 ENCOUNTER — Other Ambulatory Visit: Payer: Self-pay

## 2023-12-27 ENCOUNTER — Telehealth: Payer: Self-pay

## 2023-12-27 ENCOUNTER — Other Ambulatory Visit (HOSPITAL_COMMUNITY): Payer: Self-pay

## 2023-12-27 ENCOUNTER — Encounter (HOSPITAL_COMMUNITY): Payer: Self-pay | Admitting: Internal Medicine

## 2023-12-27 NOTE — Progress Notes (Signed)
   12/27/2023  Patient ID: [name], female   DOB: 1950-11-18, 73 y.o.   MRN: [id number]  Received incoming call from patient regarding receipt of venlafaxine  xr 150mg , states she had already talked with Dylan today. Had mentioned question of why the velfafaxine was not in packaging. During last discussion we mentioned holding off on packaging d/t her wanting to get the med changed when talking with psychiatrist, which she had recalled after leaving me a VM.  No further questions or concerns. Will next review meds Monday 9/15 2pm telephone.   Lang Sieve, PharmD, BCGP Clinical Pharmacist  [phone]

## 2023-12-30 ENCOUNTER — Telehealth: Payer: Self-pay | Admitting: *Deleted

## 2023-12-30 ENCOUNTER — Other Ambulatory Visit: Payer: Self-pay

## 2023-12-30 NOTE — Progress Notes (Unsigned)
 Complex Care Management Care Guide Note  12/30/2023 Name: Lisa Gutierrez MRN: [id number] DOB: January 12, 1951  Lisa Gutierrez is a 73 y.o. year old female who is a primary care patient of Clapp, Kara F, PA-C and is actively engaged with the care management team. I reached out to [name] by phone today to assist with re-scheduling  with the RN Case Manager.  Follow up plan: Unsuccessful telephone outreach attempt made. A HIPAA compliant phone message was left for the patient providing contact information and requesting a return call.  Thedford Franks, CMA [place]  [facility], [facility] Guide Direct Dial: [phone]  Fax: [phone] Website: [email or web address]

## 2023-12-30 NOTE — Progress Notes (Unsigned)
   12/30/2023  Patient ID: Lisa Gutierrez [name], female   DOB: Dec 11, 1950, 73 y.o.   MRN: [id number]   Medication Management: - Notes stomach upset following cardioversion got sick when coming home.  - Medication

## 2023-12-31 ENCOUNTER — Other Ambulatory Visit (HOSPITAL_COMMUNITY): Payer: Self-pay

## 2023-12-31 NOTE — Progress Notes (Signed)
 Complex Care Management Care Guide Note  12/31/2023 Name: Lisa Gutierrez MRN: [id number] DOB: 1950-10-29  Lisa Gutierrez is a 73 y.o. year old female who is a primary care patient of Clapp, Kara F, PA-C and is actively engaged with the care management team. I reached out to [name] by phone today to assist with re-scheduling  with the RN Case Manager.  Follow up plan: pt declined to reschedule at this time   Thedford Franks, CMA [facility]  [facility], [facility] Guide Direct Dial: [phone]  Fax: [phone] Website: [email or web address]

## 2024-01-01 ENCOUNTER — Other Ambulatory Visit (HOSPITAL_COMMUNITY): Payer: Self-pay

## 2024-01-01 ENCOUNTER — Other Ambulatory Visit: Payer: Self-pay

## 2024-01-01 ENCOUNTER — Other Ambulatory Visit (HOSPITAL_BASED_OUTPATIENT_CLINIC_OR_DEPARTMENT_OTHER): Payer: Self-pay

## 2024-01-01 MED FILL — Digoxin Tab 250 MCG (0.25 MG): ORAL | 90 days supply | Qty: 90 | Fill #0 | Status: CN

## 2024-01-02 ENCOUNTER — Other Ambulatory Visit: Payer: Self-pay

## 2024-01-02 ENCOUNTER — Ambulatory Visit (INDEPENDENT_AMBULATORY_CARE_PROVIDER_SITE_OTHER)

## 2024-01-02 ENCOUNTER — Other Ambulatory Visit (HOSPITAL_COMMUNITY): Payer: Self-pay

## 2024-01-02 VITALS — BP 135/59 | HR 68 | Temp 97.7°F | Ht 65.0 in | Wt 206.0 lb

## 2024-01-02 DIAGNOSIS — F411 Generalized anxiety disorder: Secondary | ICD-10-CM

## 2024-01-02 DIAGNOSIS — Z23 Encounter for immunization: Secondary | ICD-10-CM

## 2024-01-02 DIAGNOSIS — L237 Allergic contact dermatitis due to plants, except food: Secondary | ICD-10-CM

## 2024-01-02 DIAGNOSIS — K219 Gastro-esophageal reflux disease without esophagitis: Secondary | ICD-10-CM

## 2024-01-02 DIAGNOSIS — I480 Paroxysmal atrial fibrillation: Secondary | ICD-10-CM

## 2024-01-02 DIAGNOSIS — Z7984 Long term (current) use of oral hypoglycemic drugs: Secondary | ICD-10-CM

## 2024-01-02 DIAGNOSIS — I10 Essential (primary) hypertension: Secondary | ICD-10-CM

## 2024-01-02 DIAGNOSIS — I4891 Unspecified atrial fibrillation: Secondary | ICD-10-CM

## 2024-01-02 DIAGNOSIS — M25542 Pain in joints of left hand: Secondary | ICD-10-CM | POA: Diagnosis not present

## 2024-01-02 DIAGNOSIS — I152 Hypertension secondary to endocrine disorders: Secondary | ICD-10-CM | POA: Diagnosis not present

## 2024-01-02 DIAGNOSIS — M1712 Unilateral primary osteoarthritis, left knee: Secondary | ICD-10-CM | POA: Diagnosis not present

## 2024-01-02 DIAGNOSIS — E1159 Type 2 diabetes mellitus with other circulatory complications: Secondary | ICD-10-CM | POA: Diagnosis not present

## 2024-01-02 DIAGNOSIS — F3341 Major depressive disorder, recurrent, in partial remission: Secondary | ICD-10-CM | POA: Diagnosis not present

## 2024-01-02 DIAGNOSIS — F331 Major depressive disorder, recurrent, moderate: Secondary | ICD-10-CM | POA: Diagnosis not present

## 2024-01-02 DIAGNOSIS — K581 Irritable bowel syndrome with constipation: Secondary | ICD-10-CM

## 2024-01-02 DIAGNOSIS — E876 Hypokalemia: Secondary | ICD-10-CM | POA: Diagnosis not present

## 2024-01-02 DIAGNOSIS — M720 Palmar fascial fibromatosis [Dupuytren]: Secondary | ICD-10-CM | POA: Diagnosis not present

## 2024-01-02 DIAGNOSIS — M1812 Unilateral primary osteoarthritis of first carpometacarpal joint, left hand: Secondary | ICD-10-CM | POA: Diagnosis not present

## 2024-01-02 DIAGNOSIS — E1169 Type 2 diabetes mellitus with other specified complication: Secondary | ICD-10-CM

## 2024-01-02 DIAGNOSIS — M25562 Pain in left knee: Secondary | ICD-10-CM | POA: Diagnosis not present

## 2024-01-02 DIAGNOSIS — E1165 Type 2 diabetes mellitus with hyperglycemia: Secondary | ICD-10-CM

## 2024-01-02 MED ORDER — EMPAGLIFLOZIN 25 MG PO TABS
25.0000 mg | ORAL_TABLET | Freq: Every day | ORAL | 1 refills | Status: AC
  Filled 2024-01-02: qty 90, 90d supply, fill #0
  Filled 2024-01-09 – 2024-01-10 (×2): qty 30, 30d supply, fill #0
  Filled 2024-02-10: qty 30, 30d supply, fill #1
  Filled 2024-03-04: qty 30, 30d supply, fill #2
  Filled 2024-04-01: qty 30, 30d supply, fill #3
  Filled 2024-05-06 – 2024-05-07 (×2): qty 30, 30d supply, fill #4

## 2024-01-02 MED ORDER — METFORMIN HCL ER 500 MG PO TB24
500.0000 mg | ORAL_TABLET | Freq: Two times a day (BID) | ORAL | 1 refills | Status: DC
  Filled 2024-01-09 – 2024-01-10 (×2): qty 60, 30d supply, fill #0
  Filled 2024-02-10: qty 60, 30d supply, fill #1

## 2024-01-02 MED ORDER — VENLAFAXINE HCL ER 150 MG PO CP24
150.0000 mg | ORAL_CAPSULE | Freq: Every day | ORAL | 0 refills | Status: DC
  Filled 2024-01-02: qty 90, 90d supply, fill #0
  Filled 2024-01-27: qty 18, 18d supply, fill #0
  Filled 2024-01-27: qty 90, 90d supply, fill #0
  Filled 2024-02-10: qty 30, 30d supply, fill #1
  Filled 2024-03-04: qty 30, 30d supply, fill #2

## 2024-01-02 MED ORDER — DOCUSATE SODIUM 100 MG PO CAPS
100.0000 mg | ORAL_CAPSULE | Freq: Two times a day (BID) | ORAL | 2 refills | Status: AC | PRN
  Filled 2024-01-02: qty 60, 30d supply, fill #0

## 2024-01-02 MED ORDER — BUSPIRONE HCL 10 MG PO TABS
10.0000 mg | ORAL_TABLET | Freq: Two times a day (BID) | ORAL | 2 refills | Status: AC
  Filled 2024-01-02: qty 180, 90d supply, fill #0
  Filled 2024-01-06: qty 14, 7d supply, fill #0
  Filled 2024-01-09: qty 14, 7d supply, fill #1
  Filled 2024-01-10: qty 60, 30d supply, fill #1
  Filled 2024-02-10: qty 60, 30d supply, fill #2
  Filled 2024-03-04: qty 60, 30d supply, fill #3
  Filled 2024-04-01: qty 60, 30d supply, fill #4
  Filled 2024-05-06 – 2024-05-07 (×2): qty 60, 30d supply, fill #5

## 2024-01-02 MED ORDER — METOPROLOL TARTRATE 100 MG PO TABS
50.0000 mg | ORAL_TABLET | Freq: Two times a day (BID) | ORAL | 4 refills | Status: DC
  Filled 2024-01-02: qty 30, 30d supply, fill #0

## 2024-01-02 MED ORDER — AMIODARONE HCL 200 MG PO TABS
200.0000 mg | ORAL_TABLET | Freq: Every day | ORAL | 3 refills | Status: DC
  Filled 2024-01-02 – 2024-02-10 (×2): qty 90, 90d supply, fill #0
  Filled 2024-02-10: qty 30, 30d supply, fill #0
  Filled 2024-03-04: qty 30, 30d supply, fill #1
  Filled 2024-04-01: qty 30, 30d supply, fill #2
  Filled ????-??-??: fill #2

## 2024-01-02 MED ORDER — MAGNESIUM OXIDE 400 MG PO TABS
400.0000 mg | ORAL_TABLET | Freq: Every day | ORAL | 6 refills | Status: AC
  Filled 2024-01-02 – 2024-01-10 (×3): qty 30, 30d supply, fill #0
  Filled 2024-02-10: qty 30, 30d supply, fill #1
  Filled 2024-03-04 – 2024-04-01 (×2): qty 30, 30d supply, fill #2
  Filled 2024-05-06 – 2024-05-07 (×2): qty 30, 30d supply, fill #3

## 2024-01-02 MED ORDER — CLOBETASOL PROPIONATE 0.05 % EX OINT
TOPICAL_OINTMENT | Freq: Two times a day (BID) | CUTANEOUS | 2 refills | Status: AC
  Filled 2024-01-02: qty 30, 14d supply, fill #0
  Filled 2024-01-30: qty 30, 14d supply, fill #1

## 2024-01-02 MED ORDER — DIGOXIN 250 MCG PO TABS
250.0000 ug | ORAL_TABLET | Freq: Every day | ORAL | 2 refills | Status: AC
  Filled 2024-01-02 (×2): qty 30, 30d supply, fill #0
  Filled 2024-01-02: qty 90, 90d supply, fill #0
  Filled 2024-01-27: qty 30, 30d supply, fill #1
  Filled 2024-01-27: qty 18, 18d supply, fill #1
  Filled 2024-02-10: qty 30, 30d supply, fill #2
  Filled 2024-03-04: qty 30, 30d supply, fill #3
  Filled 2024-04-01: qty 30, 30d supply, fill #4
  Filled 2024-05-06 – 2024-05-07 (×2): qty 30, 30d supply, fill #5

## 2024-01-02 MED ORDER — PANTOPRAZOLE SODIUM 40 MG PO TBEC
40.0000 mg | DELAYED_RELEASE_TABLET | Freq: Two times a day (BID) | ORAL | 1 refills | Status: AC
  Filled 2024-01-09 – 2024-01-10 (×2): qty 60, 30d supply, fill #0
  Filled 2024-02-10: qty 60, 30d supply, fill #1
  Filled 2024-03-04 – 2024-03-09 (×2): qty 60, 30d supply, fill #2

## 2024-01-02 MED ORDER — APIXABAN 5 MG PO TABS
5.0000 mg | ORAL_TABLET | Freq: Two times a day (BID) | ORAL | 5 refills | Status: AC
  Filled 2024-01-09 – 2024-01-10 (×2): qty 60, 30d supply, fill #0
  Filled 2024-02-10 – 2024-03-04 (×3): qty 60, 30d supply, fill #1
  Filled 2024-04-01: qty 60, 30d supply, fill #2
  Filled 2024-05-06 – 2024-05-07 (×2): qty 60, 30d supply, fill #3

## 2024-01-02 MED ORDER — LOSARTAN POTASSIUM 25 MG PO TABS
25.0000 mg | ORAL_TABLET | Freq: Every day | ORAL | 3 refills | Status: AC
  Filled 2024-01-09 – 2024-01-10 (×2): qty 30, 30d supply, fill #0
  Filled 2024-02-10: qty 30, 30d supply, fill #1
  Filled 2024-03-04: qty 30, 30d supply, fill #2
  Filled 2024-04-01: qty 30, 30d supply, fill #3
  Filled 2024-05-06 – 2024-05-07 (×2): qty 30, 30d supply, fill #4

## 2024-01-02 MED ORDER — FREESTYLE LIBRE 3 PLUS SENSOR MISC
2 refills | Status: AC
  Filled 2024-01-02: qty 1, 15d supply, fill #0
  Filled 2024-01-30 (×2): qty 1, 15d supply, fill #1

## 2024-01-02 MED ORDER — LINACLOTIDE 290 MCG PO CAPS
290.0000 ug | ORAL_CAPSULE | Freq: Every day | ORAL | 2 refills | Status: AC
  Filled 2024-01-02: qty 30, 30d supply, fill #0
  Filled 2024-03-06 – 2024-03-09 (×2): qty 30, 30d supply, fill #1

## 2024-01-02 MED ORDER — ALPRAZOLAM 0.5 MG PO TABS
0.5000 mg | ORAL_TABLET | Freq: Every evening | ORAL | 0 refills | Status: DC | PRN
  Filled 2024-01-02 (×2): qty 30, 30d supply, fill #0

## 2024-01-02 MED ORDER — VENLAFAXINE HCL ER 37.5 MG PO CP24
37.5000 mg | ORAL_CAPSULE | Freq: Every day | ORAL | 0 refills | Status: DC
  Filled 2024-01-02 – 2024-01-09 (×2): qty 90, 90d supply, fill #0

## 2024-01-02 MED ORDER — RYBELSUS 14 MG PO TABS
14.0000 mg | ORAL_TABLET | Freq: Every day | ORAL | 3 refills | Status: DC
  Filled 2024-03-25 (×2): qty 90, 90d supply, fill #0

## 2024-01-02 MED ORDER — POTASSIUM CHLORIDE CRYS ER 20 MEQ PO TBCR
20.0000 meq | EXTENDED_RELEASE_TABLET | Freq: Every day | ORAL | 6 refills | Status: AC
  Filled 2024-01-02 – 2024-01-10 (×3): qty 30, 30d supply, fill #0
  Filled 2024-02-10: qty 30, 30d supply, fill #1
  Filled 2024-03-04: qty 30, 30d supply, fill #2
  Filled 2024-04-01: qty 30, 30d supply, fill #3
  Filled 2024-05-06 – 2024-05-07 (×2): qty 30, 30d supply, fill #4

## 2024-01-02 NOTE — Assessment & Plan Note (Signed)
 Hypokalemia and hypomagnesemia managed with supplements. Reports difficulty swallowing large tablets. - Continue potassium and magnesium  supplements. - Advise on splitting tablets for easier ingestion.

## 2024-01-02 NOTE — Assessment & Plan Note (Signed)
 BP goal <130/80.  Continue losartan 25 mg daily, metoprolol tartrate 50 mg daily and follow-up with cardiology.  Will continue to monitor and coordinate care with cardiology.

## 2024-01-02 NOTE — Assessment & Plan Note (Signed)
 Continue pantoprazole  BID

## 2024-01-02 NOTE — Progress Notes (Signed)
 Established Patient Office Visit  Subjective   Patient ID: Lisa Gutierrez, female    DOB: Jun 07, 1950  Age: 73 y.o. MRN: [id number]  Chief Complaint  Patient presents with   Hospitalization Follow-up    HPI   History of Present Illness   Lisa Gutierrez is a 73 year old female with atrial fibrillation and diabetes who presents for medication management and follow-up after recent hospitalizations.  Medication management and polypharmacy - Managing a complex medication regimen including amiodarone , venlafaxine  (Effexor ) 150 mg and 37.5 mg, Rybelsus , Jardiance , potassium supplement, pantoprazole , metoprolol  tartrate 50 mg twice daily, metformin  500 mg twice daily, magnesium , losartan  25 mg, Linzess , digoxin , alprazolam  daily, and docusate as needed for constipation - Concerned about the number of medications and potential drug interactions, especially with atrial fibrillation - Difficulty swallowing large potassium and magnesium  tablets - Currently takes Rybelsus  in the morning without food and Jardiance  at varying times  Recent hospitalizations and adverse drug reactions - Multiple hospitalizations over the past few months, most recent discharge on December 26, 2023 - Last hospitalization described as 'horrible' with feeling 'awful' for four days - Attributes part of the issue to taking Rybelsus  and Jardiance  together  Atrial fibrillation and cardiac history - History of atrial fibrillation, status post ablation complicated by blood clot - Currently on amiodarone  and Digoxin  for rhythm control and Eliquis  for anticoagulation - History of COVID-19 infections with cardiac involvement - Concerned about medication interactions with atrial fibrillation  Diabetes mellitus and glycemic control - Finger-prick method for blood glucose monitoring resulting in fingertip infections - Last hemoglobin A1c was 7.6 in August 2025 - Acknowledges need to lower A1c  further  Neuropsychiatric symptoms - Significant anxiety and depression, feeling 'depressed all the time' and crying frequently - Currently on Buspar , previously reduced in dosage, seeking medication change due to persistent symptoms - On waitlist for therapist for six months after previous therapist left - Experiencing guilt over mood swings affecting her husband  Gastrointestinal symptoms - Takes docusate as needed for constipation - Takes Linzess  for bowel regulation  Grief and psychosocial stressors - Recent loss of 45 year old cat - Has three granddaughters (ages 6, 69, and 40) whom she helped raise - Lives in [place] with supportive husband          ROS Per HPI.    Objective:     BP (!) 135/59   Pulse 68   Temp 97.7 F (36.5 C) (Oral)   Ht 5' 5 (1.651 m)   Wt 206 lb (93.4 kg)   SpO2 96%   BMI 34.28 kg/m    Physical Exam   No results found for any visits on 01/02/24.    The 10-year ASCVD risk score (Arnett DK, et al., 2019) is: 31.7%    Assessment & Plan:   Type 2 diabetes mellitus with hyperglycemia, unspecified whether long term insulin  use (HCC)  Atrial fibrillation with RVR (HCC) Assessment & Plan: Patient is s/p several cardioversion and recent ablation. Currently on Eliquis , Digoxin , Metoprolol , and Amiodarone .  Following with cardiology. She is now successfully off Tikosyn . Tolerating the additional of Amiodarone  well.  Has an upcoming appt with cardiology on 01/15/24 and 01/28/24. Continue regular follow up with cardiology and will plan to continue coordinating care with them per their recommendations.  Orders: -     Apixaban ; Take 1 tablet (5 mg total) by mouth 2 (two) times daily.  Dispense: 60 tablet; Refill: 5 -     VITAMIN D  25 Hydroxy (Vit-D  Deficiency, Fractures); Future -     TSH; Future -     Lipid panel; Future -     Comprehensive metabolic panel with GFR; Future -     CBC with Differential/Platelet; Future  Allergic  contact dermatitis due to plants, except food -     Clobetasol  Propionate; Apply twice daily to affected areas on hands until improved then as needed for flares.  Dispense: 30 g; Refill: 2  Type 2 diabetes mellitus with hyperglycemia, without long-term current use of insulin  (HCC) Assessment & Plan: Managed with Metformin  XR 500 mg BID, Rybelsus  14 mg daily, and Jardiance  25 mg daily. Last A1c was 7.6%. Given that she has been in and out of the hospital for the last 3 months, will hold off on adjusting medications at this time. At follow up in 3 months, will recheck A1c and consider increasing Metformin  to 750 XR BID. Will also update foot exam and UACR at next visit.  Orders: -     Empagliflozin ; Take 1 tablet (25 mg total) by mouth daily before breakfast.  Dispense: 90 tablet; Refill: 1 -     metFORMIN  HCl ER; Take 1 tablet (500 mg total) by mouth 2 (two) times daily with food.  Dispense: 60 tablet; Refill: 1  GAD (generalized anxiety disorder) -     ALPRAZolam ; Take 1 tablet (0.5 mg total) by mouth at bedtime as needed.  Dispense: 30 tablet; Refill: 0 -     Venlafaxine  HCl ER; Take 1 capsule (150 mg total) by mouth daily with breakfast. Take total of 187.5 mg daily. Take along with 37.5 mg cap  Dispense: 90 capsule; Refill: 0 -     Venlafaxine  HCl ER; Take 1 capsule (37.5 mg total) by mouth daily with breakfast. Take total of 187.5 mg daily. Take along with 150 mg cap  Dispense: 90 capsule; Refill: 0  Irritable bowel syndrome with constipation Assessment & Plan: Managed with Linzess  and stool softener.  Orders: -     linaCLOtide ; Take 1 capsule (290 mcg total) by mouth daily before breakfast.  Dispense: 30 capsule; Refill: 2  Gastroesophageal reflux disease without esophagitis Assessment & Plan: Continue pantoprazole  40 mg BID.  Orders: -     Pantoprazole  Sodium; Take 1 tablet (40 mg total) by mouth 2 (two) times daily.  Dispense: 180 tablet; Refill: 1  MDD (major depressive  disorder), recurrent episode, moderate (HCC) -     Venlafaxine  HCl ER; Take 1 capsule (150 mg total) by mouth daily with breakfast. Take total of 187.5 mg daily. Take along with 37.5 mg cap  Dispense: 90 capsule; Refill: 0 -     Venlafaxine  HCl ER; Take 1 capsule (37.5 mg total) by mouth daily with breakfast. Take total of 187.5 mg daily. Take along with 150 mg cap  Dispense: 90 capsule; Refill: 0  Encounter for vaccination -     Flu vaccine trivalent PF, 6mos and older(Flulaval,Afluria,Fluarix,Fluzone) -     Tdap vaccine greater than or equal to 7yo IM  Paroxysmal atrial fibrillation (HCC) -     CBC -     Basic metabolic panel with GFR  Essential hypertension -     CBC -     Basic metabolic panel with GFR  MDD (major depressive disorder), recurrent, in partial remission (HCC) Assessment & Plan: Persistent depression and anxiety with current treatment. Discussed increasing Buspar  dosage and virtual therapy options. Desires medication change due to lack of improvement and therapist availability. - Increase Buspar  to 10  mg twice daily. - Continue venlafaxine  187.5 mg. - Continue alprazolam  as needed. - Provide resources for virtual therapy. - Encourage follow-up with psychiatrist for medication management.   Hyperlipidemia associated with type 2 diabetes mellitus (HCC) Assessment & Plan: Updating lipid panel today. Will f/u with results and treat as indicated.    Hypertension associated with type 2 diabetes mellitus (HCC) Assessment & Plan: BP goal <130/80. Continue losartan  25 mg daily, metoprolol  tartrate 50 mg daily and follow-up with cardiology. Will continue to monitor and coordinate care with cardiology.    Hypokalemia Assessment & Plan: Hypokalemia and hypomagnesemia managed with supplements. Reports difficulty swallowing large tablets. - Continue potassium and magnesium  supplements. - Advise on splitting tablets for easier ingestion.   Other orders -     FreeStyle  Libre 3 Plus Sensor; Change sensor every 15 days.  Dispense: 1 each; Refill: 2 -     busPIRone  HCl; Take 1 tablet (10 mg total) by mouth 2 (two) times daily.  Dispense: 180 tablet; Refill: 2 -     Amiodarone  HCl; Take 1 tablet (200 mg total) by mouth daily.  Dispense: 90 tablet; Refill: 3 -     Digoxin ; Take 1 tablet (250 mcg total) by mouth daily.  Dispense: 90 tablet; Refill: 2 -     Docusate Sodium ; Take 1 capsule (100 mg total) by mouth 2 (two) times daily as needed.  Dispense: 60 capsule; Refill: 2 -     Losartan  Potassium; Take 1 tablet (25 mg total) by mouth daily.  Dispense: 90 tablet; Refill: 3 -     Magnesium  Oxide; Take 1 tablet (400 mg total) by mouth daily.  Dispense: 30 tablet; Refill: 6 -     Metoprolol  Tartrate; Take 0.5 tablets (50 mg total) by mouth 2 (two) times daily.  Dispense: 180 tablet; Refill: 4 -     Potassium Chloride  Crys ER; Take 1 tablet (20 mEq total) by mouth daily.  Dispense: 30 tablet; Refill: 6 -     Rybelsus ; Take 1 tablet (14 mg total) by mouth daily. Do not cut, crush, or chew.  Dispense: 90 tablet; Refill: 3    Return in about 3 months (around 04/02/2024) for Mood, HTN, DM, HLD.    Saddie JULIANNA Sacks, PA-C

## 2024-01-02 NOTE — Patient Instructions (Signed)
 VISIT SUMMARY: Today, we reviewed your current medications and addressed your concerns about potential drug interactions, especially with your atrial fibrillation. We also discussed your recent hospitalizations, diabetes management, and mental health. Additionally, we talked about your gastrointestinal symptoms, peripheral edema, dizziness, and recent grief.  YOUR PLAN: -ATRIAL FIBRILLATION: Atrial fibrillation is an irregular and often rapid heart rate that can increase the risk of strokes, heart failure, and other heart-related complications. Continue taking amiodarone  and Eliquis  as prescribed to manage your heart rhythm and prevent blood clots. Ensure you take your medications as directed.  -TYPE 2 DIABETES MELLITUS: Type 2 diabetes is a condition that affects the way your body processes blood sugar. We discussed using a continuous glucose monitor (CGM) for better blood sugar management. You will be prescribed a Freestyle Libre CGM, and we will check your insurance coverage for it. Continue taking Rybelsus  and Jardiance  as prescribed and monitor your blood sugar levels regularly.  -ESSENTIAL HYPERTENSION: Essential hypertension is high blood pressure with no identifiable cause. Continue taking losartan  and metoprolol  as prescribed to manage your blood pressure.  -HYPOKALEMIA AND HYPOMAGNESEMIA: Hypokalemia and hypomagnesemia are conditions where you have low levels of potassium and magnesium  in your blood. Continue taking your potassium and magnesium  supplements, and you can split the tablets to make them easier to swallow.  -CONSTIPATION: Constipation is a condition where you have difficulty passing stools. Continue taking docusate 100 mg, two tablets at night, and monitor your bowel movements. Adjust treatment as needed.  -GASTROESOPHAGEAL REFLUX DISEASE (GERD): GERD is a digestive disorder where stomach acid irritates the food pipe lining. Continue taking pantoprazole  as needed to manage your  symptoms.  -MAJOR DEPRESSIVE DISORDER AND GENERALIZED ANXIETY DISORDER: These are mental health conditions that cause persistent feelings of sadness and anxiety. We will increase your Buspar  dosage to 10 mg twice daily and continue alprazolam  as needed. We also provided resources for virtual therapy and encourage you to follow up with a psychiatrist for medication management.  -DERMATITIS OF HANDS: Dermatitis is a condition that causes inflammation of the skin. Continue using clobetasol  as needed to manage your symptoms.  -GENERAL HEALTH MAINTENANCE: We discussed vaccinations and general health maintenance. You will receive flu and tetanus vaccinations today. We will reassess the need for a COVID-19 vaccination at a later date.  INSTRUCTIONS: Please follow up with the renal function tests as ordered. Continue m onitoring your blood glucose levels and ensure proper medication adherence. Consider the virtual therapy resources provided and follow up with a psychiatrist for your mental health management. If you have any concerns or experience any new symptoms, please contact our office.  If you have any problems before your next visit feel free to message me via MyChart (minor issues or questions) or call the office, otherwise you may reach out to schedule an office visit.  Thank you! Saddie Sacks, PA-C

## 2024-01-02 NOTE — Assessment & Plan Note (Signed)
 Persistent depression and anxiety with current treatment. Discussed increasing Buspar  dosage and virtual therapy options. Desires medication change due to lack of improvement and therapist availability. - Increase Buspar  to 10 mg twice daily. - Continue venlafaxine  187.5 mg. - Continue alprazolam  as needed. - Provide resources for virtual therapy. - Encourage follow-up with psychiatrist for medication management.

## 2024-01-02 NOTE — Assessment & Plan Note (Signed)
 Updating lipid panel today. Will f/u with results and treat as indicated.

## 2024-01-02 NOTE — Assessment & Plan Note (Signed)
 Managed with Linzess  and stool softener.

## 2024-01-02 NOTE — Assessment & Plan Note (Signed)
 Patient is s/p several cardioversion and recent ablation. Currently on Eliquis , Digoxin , Metoprolol , and Amiodarone .  Following with cardiology. She is now successfully off Tikosyn . Tolerating the additional of Amiodarone  well.  Has an upcoming appt with cardiology on 01/15/24 and 01/28/24. Continue regular follow up with cardiology and will plan to continue coordinating care with them per their recommendations.

## 2024-01-02 NOTE — Assessment & Plan Note (Signed)
 Managed with Metformin  XR 500 mg BID, Rybelsus  14 mg daily, and Jardiance  25 mg daily. Last A1c was 7.6%. Given that she has been in and out of the hospital for the last 3 months, will hold off on adjusting medications at this time. At follow up in 3 months, will recheck A1c and consider increasing Metformin  to 750 XR BID. Will also update foot exam and UACR at next visit.

## 2024-01-03 LAB — COMPREHENSIVE METABOLIC PANEL WITH GFR
ALT: 35 IU/L — ABNORMAL HIGH (ref 0–32)
AST: 34 IU/L (ref 0–40)
Albumin: 4.1 g/dL (ref 3.8–4.8)
Alkaline Phosphatase: 74 IU/L (ref 49–135)
BUN/Creatinine Ratio: 13 (ref 12–28)
BUN: 9 mg/dL (ref 8–27)
Bilirubin Total: 0.4 mg/dL (ref 0.0–1.2)
CO2: 20 mmol/L (ref 20–29)
Calcium: 10.2 mg/dL (ref 8.7–10.3)
Chloride: 101 mmol/L (ref 96–106)
Creatinine, Ser: 0.67 mg/dL (ref 0.57–1.00)
Globulin, Total: 2.9 g/dL (ref 1.5–4.5)
Glucose: 138 mg/dL — ABNORMAL HIGH (ref 70–99)
Potassium: 4.3 mmol/L (ref 3.5–5.2)
Sodium: 140 mmol/L (ref 134–144)
Total Protein: 7 g/dL (ref 6.0–8.5)
eGFR: 93 mL/min/1.73 (ref >59)

## 2024-01-03 LAB — LIPID PANEL
Chol/HDL Ratio: 6.1 ratio — ABNORMAL HIGH (ref 0.0–4.4)
Cholesterol, Total: 159 mg/dL (ref 100–199)
HDL: 26 mg/dL — ABNORMAL LOW (ref >39)
LDL Chol Calc (NIH): 93 mg/dL (ref 0–99)
Triglycerides: 237 mg/dL — ABNORMAL HIGH (ref 0–149)
VLDL Cholesterol Cal: 40 mg/dL (ref 5–40)

## 2024-01-03 LAB — CBC WITH DIFFERENTIAL/PLATELET
Basophils Absolute: 0.1 x10E3/uL (ref 0.0–0.2)
Basos: 1 %
EOS (ABSOLUTE): 0 x10E3/uL (ref 0.0–0.4)
Eos: 1 %
Hematocrit: 41.3 % (ref 34.0–46.6)
Hemoglobin: 13.4 g/dL (ref 11.1–15.9)
Immature Grans (Abs): 0.1 x10E3/uL (ref 0.0–0.1)
Immature Granulocytes: 1 %
Lymphocytes Absolute: 1.6 x10E3/uL (ref 0.7–3.1)
Lymphs: 20 %
MCH: 29.8 pg (ref 26.6–33.0)
MCHC: 32.4 g/dL (ref 31.5–35.7)
MCV: 92 fL (ref 79–97)
Monocytes Absolute: 0.8 x10E3/uL (ref 0.1–0.9)
Monocytes: 10 %
Neutrophils Absolute: 5.4 x10E3/uL (ref 1.4–7.0)
Neutrophils: 67 %
Platelets: 235 x10E3/uL (ref 150–450)
RBC: 4.5 x10E6/uL (ref 3.77–5.28)
RDW: 13.1 % (ref 11.7–15.4)
WBC: 8 x10E3/uL (ref 3.4–10.8)

## 2024-01-03 LAB — VITAMIN D 25 HYDROXY (VIT D DEFICIENCY, FRACTURES): Vit D, 25-Hydroxy: 33.3 ng/mL (ref 30.0–100.0)

## 2024-01-03 LAB — TSH: TSH: 0.982 u[IU]/mL (ref 0.450–4.500)

## 2024-01-05 ENCOUNTER — Ambulatory Visit: Payer: Self-pay

## 2024-01-06 ENCOUNTER — Other Ambulatory Visit: Payer: Self-pay

## 2024-01-06 ENCOUNTER — Other Ambulatory Visit (HOSPITAL_COMMUNITY): Payer: Self-pay

## 2024-01-06 ENCOUNTER — Telehealth: Payer: Self-pay

## 2024-01-06 MED ORDER — EZETIMIBE 10 MG PO TABS
10.0000 mg | ORAL_TABLET | Freq: Every day | ORAL | 3 refills | Status: AC
  Filled 2024-01-06: qty 90, 90d supply, fill #0
  Filled 2024-02-10: qty 90, 90d supply, fill #1
  Filled 2024-03-06 – 2024-04-01 (×2): qty 30, 30d supply, fill #1
  Filled 2024-05-06 – 2024-05-07 (×2): qty 30, 30d supply, fill #2

## 2024-01-06 NOTE — Progress Notes (Signed)
   01/06/2024  Patient ID: Lisa Gutierrez, female   DOB: 09/05/1950, 73 y.o.   MRN: [id number]  01/06/2024 med mgmt f/u - notes recently being advised by PCP to possibly start on zetia  as alternative to statin therapy, which she is intolerant to. Counseled on Rx and is agreeable to starting. Will message PCP.  - buspar  bumped up to 10mg  BID by PCP - messaged Dylan CPhT at pharmacy to have filled, set to align with packaging fill dates.  - reviewed potential side effects of cardiovascular medications - reviewed CGM recently prescribed - opts to hold of d/t being a possible additional stressor, doing ok with finger sticks - recent FBG 130s-140s.   Future Appointments  Date Time Provider Department Center  01/10/2024  9:30 AM Vickey Mettle, MD [facility] None  01/15/2024  3:00 PM Cindie Ole DASEN, MD [facility] None  01/17/2024 11:30 AM Pandora Cadet, RPH [facility] None  01/28/2024 11:20 AM Gerard Frederick, NP [facility] None  03/26/2024  1:00 PM PCFO-ANNUAL WELLNESS VISIT [facility]    Cadet Pandora, PharmD, [facility] Clinical Pharmacist  [phone]

## 2024-01-06 NOTE — Progress Notes (Signed)
   01/06/2024  Patient ID: [name], female   DOB: 03-13-1951, 73 y.o.   MRN: [id number]  Incoming message from [facility], is requesting Rx for Zetia  to be sent along with new Buspar  Rx. I messaged Dylan to request. Patient made aware that I have requested this. Confirmed that patient has pharmacy contact information should she need to reach out for additional updates.   Lang Sieve, PharmD, [facility] Clinical Pharmacist  [phone]

## 2024-01-07 ENCOUNTER — Other Ambulatory Visit (HOSPITAL_COMMUNITY): Payer: Self-pay

## 2024-01-07 NOTE — Progress Notes (Deleted)
 [facility] MD/PA/NP OP Progress Note  01/07/2024 9:05 AM Lisa Gutierrez  MRN:  [id number]  Chief Complaint: No chief complaint on file.  HPI:  - since the last visit, Tikosyn  was discontinued, and amiodarone  was started. She underwent cardioversion.  - buspar  was uptitrated to 10 mg BID by her primary care, Saddie JULIANNA Sacks, PA-C     Visit Diagnosis: No diagnosis found.  Past Psychiatric History: Please see initial evaluation for full details. I have reviewed the history. No updates at this time.     Past Medical History:  Past Medical History:  Diagnosis Date   Anxiety    Atrial fibrillation (HCC)    Depression    Depression    Phreesia 07/02/2020   Diabetes mellitus without complication (HCC)    GERD (gastroesophageal reflux disease)    Hypertension    IBS (irritable bowel syndrome)    Neuromuscular disorder (HCC)     Past Surgical History:  Procedure Laterality Date   ATRIAL FIBRILLATION ABLATION N/A 05/02/2023   Procedure: ATRIAL FIBRILLATION ABLATION;  Surgeon: Cindie Ole DASEN, MD;  Location: [facility];  Service: Cardiovascular;  Laterality: N/A;   BREAST EXCISIONAL BIOPSY     CARDIOVERSION N/A 09/03/2022   Procedure: CARDIOVERSION;  Surgeon: Darron Deatrice LABOR, MD;  Location: [facility] ORS;  Service: Cardiovascular;  Laterality: N/A;   CARDIOVERSION N/A 06/03/2023   Procedure: CARDIOVERSION;  Surgeon: Darliss Rogue, MD;  Location: [facility] ORS;  Service: Cardiovascular;  Laterality: N/A;   CARDIOVERSION N/A 10/31/2023   Procedure: CARDIOVERSION;  Surgeon: Lonni Slain, MD;  Location: [facility] INVASIVE CV LAB;  Service: Cardiovascular;  Laterality: N/A;   CARDIOVERSION N/A 11/15/2023   Procedure: CARDIOVERSION;  Surgeon: Francyne Headland, MD;  Location: [facility];  Service: Cardiovascular;  Laterality: N/A;   CARDIOVERSION N/A 12/26/2023   Procedure: CARDIOVERSION;  Surgeon: Santo Stanly LABOR, MD;  Location: [facility];  Service: Cardiovascular;   Laterality: N/A;   CATARACT EXTRACTION W/PHACO Right 07/24/2022   Procedure: CATARACT EXTRACTION PHACO AND INTRAOCULAR LENS PLACEMENT (IOC) RIGHT DIABETIC  5.93  00:42.5;  Surgeon: Jaye Fallow, MD;  Location: [facility];  Service: Ophthalmology;  Laterality: Right;   CATARACT EXTRACTION W/PHACO Left 08/07/2022   Procedure: CATARACT EXTRACTION PHACO AND INTRAOCULAR LENS PLACEMENT (IOC) LEFT DIABETIC  12.33  00:58.3;  Surgeon: Jaye Fallow, MD;  Location: [facility];  Service: Ophthalmology;  Laterality: Left;   CESAREAN SECTION N/A    Phreesia 07/02/2020   CHOLECYSTECTOMY     EXCISION / BIOPSY BREAST / NIPPLE / DUCT Right 1973   duct removed   SPINE SURGERY N/A    Phreesia 07/02/2020   TUBAL LIGATION N/A    Phreesia 07/02/2020    Family Psychiatric History: Please see initial evaluation for full details. I have reviewed the history. No updates at this time.     Family History:  Family History  Problem Relation Age of Onset   Diabetes Mother    Hypertension Mother    Coronary artery disease Father    Glaucoma Father    Breast cancer Neg Hx     Social History:  Social History   Socioeconomic History   Marital status: Married    Spouse name: Not on file   Number of children: Not on file   Years of education: Not on file   Highest education level: Not on file  Occupational History   Not on file  Tobacco Use   Smoking status: Former  Passive exposure: Past   Smokeless tobacco: Never   Tobacco comments:    Former smoker 10/29/23  Vaping Use   Vaping status: Never Used  Substance and Sexual Activity   Alcohol use: No    Alcohol/week: 0.0 standard drinks of alcohol   Drug use: Never   Sexual activity: Not Currently    Partners: Male  Other Topics Concern   Not on file  Social History Narrative   Not on file   Social Drivers of Health   Financial Resource Strain: Low Risk  (01/02/2024)   Received from [facility]    Overall Financial Resource Strain (CARDIA)    Difficulty of Paying Living Expenses: Not hard at all  Food Insecurity: No Food Insecurity (01/02/2024)   Received from [facility]   Hunger Vital Sign    Within the past 12 months, you worried that your food would run out before you got the money to buy more.: Never true    Within the past 12 months, the food you bought just didn't last and you didn't have money to get more.: Never true  Transportation Needs: No Transportation Needs (01/02/2024)   Received from [facility] - Transportation    In the past 12 months, has lack of transportation kept you from medical appointments or from getting medications?: No    Lack of Transportation (Non-Medical): No  Physical Activity: Inactive (08/09/2022)   Exercise Vital Sign    Days of Exercise per Week: 0 days    Minutes of Exercise per Session: 0 min  Stress: Stress Concern Present (10/15/2022)   [employer] of Occupational Health - Occupational Stress Questionnaire    Feeling of Stress : Rather much  Social Connections: Moderately Integrated (11/06/2023)   Social Connection and Isolation Panel    Frequency of Communication with Friends and Family: More than three times a week    Frequency of Social Gatherings with Friends and Family: More than three times a week    Attends Religious Services: 1 to 4 times per year    Active Member of [employer] or Organizations: No    Attends Banker Meetings: Never    Marital Status: Married    Allergies:  Allergies  Allergen Reactions   Aspirin Other (See Comments)    GI upset   Lexapro  [Escitalopram ] Other (See Comments)    Weight gain   Nsaids Other (See Comments)    GI upset   Ozempic  (0.25 Or 0.5 Mg-Dose) [Semaglutide (0.25 Or 0.5mg -Dos)] Nausea And Vomiting   Quinolones     Denies any known reaction to this.   Robinul  [Glycopyrrolate] Nausea And Vomiting   Morphine Swelling and Rash    Penicillin V Potassium Swelling and Rash    As a child   Sulfa Antibiotics Swelling and Rash    As a child    Metabolic Disorder Labs: Lab Results  Component Value Date   HGBA1C 7.6 (H) 11/06/2023   MPG 171.42 11/06/2023   MPG 169 07/10/2022   No results found for: PROLACTIN Lab Results  Component Value Date   CHOL 159 01/02/2024   TRIG 237 (H) 01/02/2024   HDL 26 (L) 01/02/2024   CHOLHDL 6.1 (H) 01/02/2024   LDLCALC 93 01/02/2024   LDLCALC 83 03/26/2023   Lab Results  Component Value Date   TSH 0.982 01/02/2024   TSH 0.819 11/28/2023    Therapeutic Level Labs: No results found for: LITHIUM No results found  for: VALPROATE No results found for: CBMZ  Current Medications: Current Outpatient Medications  Medication Sig Dispense Refill   acetaminophen  (TYLENOL ) 500 MG tablet Take 1,000 mg by mouth every 8 (eight) hours as needed for mild pain (pain score 1-3), moderate pain (pain score 4-6) or headache.     ALPRAZolam  (XANAX ) 0.5 MG tablet Take 1 tablet (0.5 mg total) by mouth at bedtime as needed. 30 tablet 0   amiodarone  (PACERONE ) 200 MG tablet Take 1 tablet (200 mg total) by mouth daily. 90 tablet 3   apixaban  (ELIQUIS ) 5 MG TABS tablet Take 1 tablet (5 mg total) by mouth 2 (two) times daily. 60 tablet 5   busPIRone  (BUSPAR ) 10 MG tablet Take 1 tablet (10 mg total) by mouth 2 (two) times daily. 180 tablet 2   Calcium  Carb-Cholecalciferol  (CALCIUM  600/VITAMIN D3) 600-20 MG-MCG TABS Take 1 tablet by mouth daily.     Cholecalciferol  (VITAMIN D3) 50 MCG (2000 UT) capsule Take 2,000 Units by mouth daily.     clobetasol  ointment (TEMOVATE ) 0.05 % Apply twice daily to affected areas on hands until improved then as needed for flares. 30 g 2   Continuous Glucose Sensor (FREESTYLE LIBRE 3 PLUS SENSOR) MISC Change sensor every 15 days. 1 each 2   digoxin  (LANOXIN ) 0.25 MG tablet Take 1 tablet (250 mcg total) by mouth daily. 90 tablet 2   docusate sodium  (STOOL SOFTENER)  100 MG capsule Take 1 capsule (100 mg total) by mouth 2 (two) times daily as needed. 60 capsule 2   empagliflozin  (JARDIANCE ) 25 MG TABS tablet Take 1 tablet (25 mg total) by mouth daily before breakfast. 90 tablet 1   ezetimibe  (ZETIA ) 10 MG tablet Take 1 tablet (10 mg total) by mouth daily. 90 tablet 3   Flaxseed, Linseed, (FLAXSEED OIL) 1400 MG CAPS Take 1,400 mg by mouth 2 (two) times daily.     furosemide  (LASIX ) 20 MG tablet Take 1 tablet (20 mg total) by mouth daily. 30 tablet 6   glucose blood test strip Use in the morning, at noon, and at bedtime. 100 each 12   hydrocortisone  (ANUSOL -HC) 25 MG suppository Place 1 suppository (25 mg total) rectally 2 (two) times daily. (Patient taking differently: Place 25 mg rectally 2 (two) times daily as needed for hemorrhoids.) 12 suppository 0   linaclotide  (LINZESS ) 290 MCG CAPS capsule Take 1 capsule (290 mcg total) by mouth daily before breakfast. 30 capsule 2   losartan  (COZAAR ) 25 MG tablet Take 1 tablet (25 mg total) by mouth daily. 90 tablet 3   magnesium  oxide (MAG-OX) 400 MG tablet Take 1 tablet (400 mg total) by mouth daily. 30 tablet 6   metFORMIN  (GLUCOPHAGE -XR) 500 MG 24 hr tablet Take 1 tablet (500 mg total) by mouth 2 (two) times daily with food. 60 tablet 1   metoprolol  tartrate (LOPRESSOR ) 100 MG tablet Take 0.5 tablets (50 mg total) by mouth 2 (two) times daily. 180 tablet 4   Multiple Vitamins-Minerals (MULTIVITAMIN ADULT PO) Take 1 tablet by mouth daily. One a day     pantoprazole  (PROTONIX ) 40 MG tablet Take 1 tablet (40 mg total) by mouth 2 (two) times daily. 180 tablet 1   potassium chloride  SA (KLOR-CON  M) 20 MEQ tablet Take 1 tablet (20 mEq total) by mouth daily. 30 tablet 6   Semaglutide  (RYBELSUS ) 14 MG TABS Take 1 tablet (14 mg total) by mouth daily. Do not cut, crush, or chew. 90 tablet 3   Semaglutide  (RYBELSUS ) 14 MG  TABS Take 1 tablet (14 mg total) by mouth daily. Do not cut, crush, or chew. 90 tablet 3   venlafaxine  XR  (EFFEXOR -XR) 150 MG 24 hr capsule Take 1 capsule (150 mg total) by mouth daily with breakfast. Take total of 187.5 mg daily. Take along with 37.5 mg cap 90 capsule 0   venlafaxine  XR (EFFEXOR -XR) 37.5 MG 24 hr capsule Take 1 capsule (37.5 mg total) by mouth daily with breakfast. Take total of 187.5 mg daily. Take along with 150 mg cap 90 capsule 0   No current facility-administered medications for this visit.     Musculoskeletal: Strength & Muscle Tone: N/A Gait & Station: N/A Patient leans: N/A  Psychiatric Specialty Exam: Review of Systems  There were no vitals taken for this visit.There is no height or weight on file to calculate BMI.  General Appearance: {Appearance:22683}  Eye Contact:  {BHH EYE CONTACT:22684}  Speech:  Clear and Coherent  Volume:  Normal  Mood:  {BHH MOOD:22306}  Affect:  {Affect (PAA):22687}  Thought Process:  Coherent  Orientation:  Full (Time, Place, and Person)  Thought Content: Logical   Suicidal Thoughts:  {ST/HT (PAA):22692}  Homicidal Thoughts:  {ST/HT (PAA):22692}  Memory:  Immediate;   Good  Judgement:  {Judgement (PAA):22694}  Insight:  {Insight (PAA):22695}  Psychomotor Activity:  Normal  Concentration:  Concentration: Good and Attention Span: Good  Recall:  Good  Fund of Knowledge: Good  Language: Good  Akathisia:  No  Handed:  Right  AIMS (if indicated): not done  Assets:  Communication Skills  ADL's:  Intact  Cognition: WNL  Sleep:  {BHH GOOD/FAIR/POOR:22877}   Screenings: GAD-7    Flowsheet Row Office Visit from 01/02/2024 in [facility] at [facility] Patient Outreach Telephone from 11/14/2023 in [place] POPULATION [facility]ALTH DEPARTMENT Office Visit from 05/09/2023 in [facility] at [facility] Office Visit from 12/27/2022 in [facility] at [facility] Video Visit from 11/08/2022 in [facility] Primary Care at [facility]  Total GAD-7 Score 14 15 15 17 15    Mini-Mental    Flowsheet Row Clinical  Support from 02/12/2020 in [facility], [facility] Clinical Support from 02/06/2018 in [facility], [facility]  Total Score (max 30 points ) 29 30   PHQ2-9    Flowsheet Row Patient Outreach Telephone from 11/14/2023 in [place] POPULATION HEALTH DEPARTMENT Office Visit from 05/09/2023 in [facility] Primary Care at [facility] Office Visit from 12/27/2022 in [facility] Primary Care at [facility] Video Visit from 11/08/2022 in [facility] Primary Care at [facility] Coordination from 10/15/2022 in [facility] Coordination  PHQ-2 Total Score 6 4 6 6 5   PHQ-9 Total Score 19 14 24 22 19    Flowsheet Row Admission (Discharged) from 12/26/2023 in [facility] CARDIAC CATH LAB Admission (Discharged) from 11/15/2023 in [facility] CARDIAC CATH LAB ED to Hosp-Admission (Discharged) from 11/05/2023 in [facility] Emergency Department at [facility]  C-SSRS RISK CATEGORY No Risk No Risk No Risk     Assessment and Plan:  Jahmya Onofrio is a 73 y.o. year old female with a history of depression, hypertension, diabetes, GERD, newly diagnosed Afib with RVR, who presents for follow up appointment for below.    1. PTSD (post-traumatic stress disorder) 2. MDD (major depressive disorder), recurrent episode, moderate (HCC) 3. GAD (generalized anxiety disorder) The patient reports chronic joint pain. Psychologically, she describes significant emotional  strain, feeling unsupported and isolated. Socially, she states that her husband, son, and grandchildren are mean and hateful toward her and lack empathy or sympathy, though she denies any history of abuse. She reports limited emotional support and struggles with coping, often feeling overwhelmed and alone in managing her physical and emotional needs. History:  Tx from Dr. Vincente     Notable for less emotional lability, although she understandably has heightened sense of anxiety related  to A-fib, and recent admissions.  According to the pharmacist, Dr. Cindie team, it is safe to uptitrate venlafaxine .  While this will be considered in the future, it may not be the best timing until we observe relative stability in her rhythm, in order to avoid complicating the clinical picture.  She agrees to maintain on the current medication regimen.  Will continue venlafaxine  to target PTSD, depression and anxiety.  Will continue BuSpar  for anxiety.  Noted that while she reports worsening sense of her mood symptoms in the context of tapering down this medication, her irritability appears to be more manageable.  Will continue to assess this.  She will greatly benefit from CBT; referral was made and she is on the waiting list.    # insomnia - sleep eval long time ago, reportedly normal.  Unstable.  She complains of middle insomnia and daytime fatigue. Although she was strongly encouraged to consider a sleep evaluation, she declined at this.  Will make the above intervention as outlined above.      Plan Continue venlafaxine  187.5 mg daily- monitor weight gain Continue Buspar  5 mg twice a day-  reduced by 5 mg in May 2025 due to concern of possible adverse reaction.  Next appointment : 9/26 at 9 30, video Referred for therapy, onsite - sent a message to Dr. Cindie whether it is appropriate to uptitrate venlafaxine  prior to this visit - on xanax  0.5 mg at night as needed for anxiety, prescribed by primary care    Past trials of medication: sertraline, citalopram, lexapro , fluoxetine , bupropion  (headache)   The patient demonstrates the following risk factors for suicide: Chronic risk factors for suicide include: psychiatric disorder of depression and chronic pain. Acute risk factors for suicide include: family or marital conflict. Protective factors for this patient include: hope for the future. Considering these factors, the overall suicide risk at this point appe    Collaboration of Care:  Collaboration of Care: [facility] OP Collaboration of Rjmz:[id number]}  Patient/Guardian was advised Release of Information must be obtained prior to any record release in order to collaborate their care with an outside provider. Patient/Guardian was advised if they have not already done so to contact the registration department to sign all necessary forms in order for us  to release information regarding their care.   Consent: Patient/Guardian gives verbal consent for treatment and assignment of benefits for services provided during this visit. Patient/Guardian expressed understanding and agreed to proceed.    Katheren Sleet, MD 01/07/2024, 9:05 AM

## 2024-01-08 ENCOUNTER — Telehealth: Payer: Self-pay

## 2024-01-08 ENCOUNTER — Other Ambulatory Visit: Payer: Self-pay

## 2024-01-08 ENCOUNTER — Other Ambulatory Visit (HOSPITAL_COMMUNITY): Payer: Self-pay

## 2024-01-08 MED ORDER — VENLAFAXINE HCL ER 75 MG PO CP24
75.0000 mg | ORAL_CAPSULE | Freq: Every day | ORAL | 1 refills | Status: AC
  Filled 2024-01-08 – 2024-01-10 (×2): qty 30, 30d supply, fill #0
  Filled 2024-02-10: qty 30, 30d supply, fill #1
  Filled 2024-03-04: qty 30, 30d supply, fill #2
  Filled 2024-04-01: qty 30, 30d supply, fill #3
  Filled 2024-05-06 – 2024-05-07 (×2): qty 30, 30d supply, fill #4

## 2024-01-08 NOTE — Telephone Encounter (Signed)
 If her partner is fever free, they can see each other with masks on. Before non-masked contact, they should wait 10 days from the positive covid test to prevent exposure.

## 2024-01-08 NOTE — Telephone Encounter (Signed)
 Copied from CRM #[id number]. Topic: General - Other >> Jan 08, 2024 10:13 AM Olam RAMAN wrote: Reason for CRM: caller is asking to see how long she would have to stay away from her partner who has covid. Pt does not have any symptoms as of now. [phone] celll. NO HOME NUMBER

## 2024-01-08 NOTE — Telephone Encounter (Signed)
 Called and spoke with patient/ related message from provider - Saddie.

## 2024-01-09 ENCOUNTER — Other Ambulatory Visit: Payer: Self-pay

## 2024-01-09 ENCOUNTER — Other Ambulatory Visit (HOSPITAL_COMMUNITY): Payer: Self-pay

## 2024-01-10 ENCOUNTER — Telehealth: Admitting: Psychiatry

## 2024-01-10 ENCOUNTER — Other Ambulatory Visit: Payer: Self-pay

## 2024-01-13 ENCOUNTER — Other Ambulatory Visit: Payer: Self-pay

## 2024-01-13 NOTE — Telephone Encounter (Signed)
 Patient called in to r/s appt on 10/1 - she has been r/s to 10/15.

## 2024-01-15 ENCOUNTER — Ambulatory Visit: Admitting: Cardiology

## 2024-01-15 ENCOUNTER — Other Ambulatory Visit: Payer: Self-pay

## 2024-01-16 ENCOUNTER — Other Ambulatory Visit: Payer: Self-pay | Admitting: Psychiatry

## 2024-01-16 DIAGNOSIS — F411 Generalized anxiety disorder: Secondary | ICD-10-CM

## 2024-01-16 DIAGNOSIS — F3341 Major depressive disorder, recurrent, in partial remission: Secondary | ICD-10-CM

## 2024-01-17 ENCOUNTER — Other Ambulatory Visit: Payer: Self-pay

## 2024-01-17 NOTE — Progress Notes (Signed)
   01/17/2024 Name: Lisa Gutierrez MRN: [id number] DOB: [birth year]  Chief Complaint  Patient presents with   Medication Management    Spoke with patient regarding medication management.  - had questions regarding effexor  XR dosing - previously was taking 187.5 (150mg  + 37.5mg ), had been adjusted by PCP to 225 (150mg  + 72mg ) by PCP - medications are starting to get brought into packaging, provided clarification on which ones should and should not be in packs, understands amiodarone  and digoxin  should not be at this time.   Future Appointments  Date Time Provider Department Center  01/24/2024 11:00 AM Pandora Cadet, [facility] None  01/28/2024 11:20 AM Gerard Frederick, NP CVD-[facility] None  01/29/2024 11:00 AM Cindie Ole DASEN, MD [facility] None  03/26/2024  1:00 PM PCFO-ANNUAL WELLNESS VISIT [facility]FO-PC[facility]     Cadet Pandora, PharmD, [facility] Clinical Pharmacist  [phone]

## 2024-01-21 ENCOUNTER — Telehealth: Payer: Self-pay

## 2024-01-21 NOTE — Progress Notes (Signed)
   01/21/2024  Patient ID: Lisa Gutierrez, female   DOB: 07/21/1950, 73 y.o.   MRN: [id number]  Incoming call from patient regarding adherence packaging question. Was going out the door at time of our call so requested to call me back later. Patient stated that her metformin  had only been packaged as one singe dose rather than in two separate packs for BID dosing.   Will obtain any additional information/clarification upon patient's return call.   Lang Sieve, PharmD, [facility] Clinical Pharmacist  [phone]

## 2024-01-24 ENCOUNTER — Other Ambulatory Visit: Payer: Self-pay

## 2024-01-24 NOTE — Progress Notes (Signed)
   01/24/2024  Patient ID: Lisa Gutierrez, female   DOB: Jun 04, 1950, 73 y.o.   MRN: [id number]  Briefly spoke with patient regarding adherence packaging. Had several questions about packaging details so was encouraged to contact [facility] and call me this afternoon if needed. If patient is wanting to meet in person to review meds/understand what she should be taking, this was offered to her. She will just need to let me know.   Lang Sieve, PharmD, [facility] Clinical Pharmacist  [phone]

## 2024-01-27 ENCOUNTER — Other Ambulatory Visit: Payer: Self-pay

## 2024-01-27 ENCOUNTER — Other Ambulatory Visit (HOSPITAL_COMMUNITY): Payer: Self-pay

## 2024-01-28 ENCOUNTER — Other Ambulatory Visit: Payer: Self-pay

## 2024-01-28 ENCOUNTER — Encounter: Payer: Self-pay | Admitting: Cardiology

## 2024-01-28 ENCOUNTER — Ambulatory Visit: Attending: Cardiology | Admitting: Cardiology

## 2024-01-28 ENCOUNTER — Other Ambulatory Visit (HOSPITAL_COMMUNITY): Payer: Self-pay

## 2024-01-28 VITALS — BP 140/80 | Ht 66.0 in | Wt 205.4 lb

## 2024-01-28 DIAGNOSIS — E669 Obesity, unspecified: Secondary | ICD-10-CM | POA: Diagnosis not present

## 2024-01-28 DIAGNOSIS — I4819 Other persistent atrial fibrillation: Secondary | ICD-10-CM | POA: Diagnosis not present

## 2024-01-28 DIAGNOSIS — E1165 Type 2 diabetes mellitus with hyperglycemia: Secondary | ICD-10-CM

## 2024-01-28 DIAGNOSIS — D6869 Other thrombophilia: Secondary | ICD-10-CM

## 2024-01-28 DIAGNOSIS — F411 Generalized anxiety disorder: Secondary | ICD-10-CM

## 2024-01-28 DIAGNOSIS — I10 Essential (primary) hypertension: Secondary | ICD-10-CM

## 2024-01-28 NOTE — Progress Notes (Unsigned)
 Cardiology Office Note   Date:  01/29/2024  ID:  Lisa, Gutierrez 08/25/1950, MRN [id number] PCP: [name]  [facility] Providers Cardiologist:  Deatrice Cage, MD Cardiology APP:  Gerard Frederick, NP  Electrophysiologist:  [name], MD     History of Present Illness Lisa Gutierrez is a 73 y.o. female with a past medical history of essential hypertension, chronic electrolyte abnormalities, GERD, IBS constipation, chronic superficial gastritis without bleeding, fatty liver disease, type 2 diabetes, GAD, insomnia, chronic history of statin intolerance, morbid obesity, persistent atrial fibrillation status post multiple DCCV (09/03/22, 06/03/23, 10/31/23, 12/26/23) with ablation (05/02/2023) and multiple medication failures, who is here today for follow-up after recent DCCV.   She had previously presented for cataract surgery on 07/10/2022 was found to be in atrial fibrillation with RVR by anesthesia.  Heart rate was 1 70-1 9 past time and procedure was subsequently canceled.  Echocardiogram revealed an LVEF of 50-55%, no RWMA, no valvular abnormalities were noted.  She was started on metoprolol , dig, and apixaban  and subsequently discharged on 07/04/2022.  She followed up in clinic on July 16, 2022 where she been tolerating her apixaban  and beta-blockers without incident she was scheduled for cardioversion procedure after being on anticoagulant with 4 weeks uninterrupted.  She did previously undergone cardioversion that was successful for generalized vision she was noted to be back in atrial fibrillation.   She was last seen in clinic 11/30/2022 she continued to be tearful with complaints of being depressed, weak, fatigued, anxious, with multiple questions about her medications.  She was continued on apixaban  for CHA2DS2-VASc of at least 5 for stroke prophylaxis.  EKG revealed she was still in atrial fibrillation.  She was apprehensive about repeat cardioversion  she was referred to EP.  She was seen in clinic by EP on 02/27/2023 discussed treatment options for atrial fibrillation including antiarrhythmic drug therapy and ablation.  After discussing risk, recovery, and likelihood of success with the treatment strategy she had opted for the ablation procedure.  This was to be scheduled for 05/02/2023.Lisa Gutierrez  She [facility] to [facility] department 1/23 with shortness of breath, hypoxic.  PCR swab was positive for RSV.  Chest x-ray concerning for pneumonia.  She continued to remain in sinus rhythm on presentation.  She was admitted overnight.  Since discharge she had an ongoing cough that improved with Mucinex .  She noticed a catching sensation in her chest and that she was back in A-fib.  She states she was diligent about taking her apixaban  twice daily and was scheduled for repeat cardioversion.  She was seen in clinic by EP on 05/30/2023 for routine 4-week post ablation appointment where she was in A-fib.  She was status post DCCV with return to sinus rhythm.  She was then followed up and was not aware of any A-fib episodes.  She does not radiate, or catching sensation in her checks.  Blood pressure has been well-controlled.  Upper respiratory infection with increased nasal drainage and some facial tenderness and cough.  She questions what medications that she can safely take at the time.  Continue to diligently take her apixaban  with no missed doses.  She was to continue to maintain sinus rhythm continue on metoprolol  75 mg twice daily and apixaban  5 mg twice daily.  For blood pressure she was continued on losartan  25 mg daily.  She was seen in clinic 07/24/23 states she been doing well from a cardiac perspective.  She continues to  have occasional sensation which only she was unable to catch her breath.  States she been compliant with her current medication regimen.  She was sent for an updated digoxin  level and was continued on her current medication regimen with no  further testing that was ordered at that time.   She was seen by EP 08/21/2023.  She was doing well without cardiac complaints.  She brought in a BP and pulse log with blood pressure range 120/130 systolic.  Pulse was ranging 50-70.  She denies chest pain, chest pressure or palpitations.  No increased swelling.  She was continued on metoprolol  75 mg twice daily, losartan  25 mg daily and apixaban  5 mg twice daily.  There were no changes made to her medication regimen and no further testing that was ordered.  She was evaluated on 09/10/2023 in clinic after she contracted several times with elevated blood pressures and heart rates have increased fatigue and shortness of breath as well as palpitations.  She had recently undergone 2 successful cardioversions and then had an ablation procedure and was previously maintaining sinus rhythm at the beginning of the month her EP appointment.  She states that she had notified that she converted back into atrial fibrillation and all of her symptoms started again.  She stated that her anxiety was worse and she felt that difficult to differentiate between anxiety and her atrial fibrillation.  EKG was done and she was noted to be back in atrial fibrillation with rates of 120s to 130s.  She was being sent for updated labs and scheduled for an updated appointment with EP to discuss antiarrhythmics versus repeat ablation procedure with EP.  She was evaluated by Dr. Cindie on 09/25/2023 of and she was interested in avoiding another catheter ablation which was deemed reasonable.  She had opted for hospital admission and Tikosyn  initiation.  She was going to be admitted to the hospital 10/29/2023 5 to start Tikosyn  therapy.  She was hospitalized at [facility] 7/15/7/18 for persistent atrial fibrillation status post Tikosyn  loading.  She underwent successful direct-current cardioversion on 10/31/2023 by Dr. Bertrum which successfully restored sinus rhythm.  There were no  apparent early complications noted.  Renal function and electrolytes were followed during her hospitalization.  Her QTc remained stable.  She was considered stable and was able to be discharged home on 11/01/2023 with 1 week follow-up.  She was admitted to [facility] 7/22 through 11/03/2023 with atrial fibrillation with RVR that was likely triggered by recent upper respiratory infection.  She was restarted on her Tikosyn , beta-blocker and digoxin  as well as apixaban .  Recommended with outpatient cardioversion.  Chest x-ray did not show any pneumonia blood cultures were negative and COVID and influenza were negative.  She was discharged home with outpatient follow-up in 1 week.  She was evaluated in A-fib clinic on 11/11/2023.  She was scheduled for a cardioversion procedure and continued on apixaban , Tikosyn , dig, and Lopressor .  Patient underwent elective cardioversion and was shocked 1 time at 200 J and converted to normal sinus rhythm.  Metoprolol  was reduced to 50 mg twice daily due to sinus bradycardia and 50 bpm status post procedure.  There were no immediate postoperative complications noted.  She followed up with EP status post cardioversion procedure status post 1 month of Tikosyn  visit as well.  EKG revealed atrial fibrillation with 118 bpm.  She was considered to have Tikosyn  failure.  Tikosyn  was stopped and she was to start amiodarone  on Sunday 8/17 at 200 mg  twice daily for 2 weeks and then 200 mg daily.  Borderline LFTs and TSH.  Apixaban  was continued at 5 mg twice daily.  She returned to A-fib clinic on 12/20/2023 continue to deny symptoms of palpitations chest pain shortness of breath orthopnea.  Continued on amiodarone , apixaban , digoxin , and Lopressor .  Sent for updated labs.  In the long-term she was considering repeat ablation.  Discussed rhythm control options and discussed repeat cardioversion procedure after load on amiodarone .  She underwent direct-current cardioversion procedure on  12/26/2023 and shocked at 200 J and converted to sinus rhythm first-degree AV block.  There were no immediate postoperative complications noted.   She returns to clinic today extremely tearful and depressed and confused about her medications and if she has taken them as she is supposed to.  She states that she recently had COVID and continues to endorse extreme fatigue and brain fog.  Denies any shortness of breath, chest pain, and palpitations.  She states that she does not recognize her medications and pills changed and she has several questions today.  She states that she is unsure if she wants to continue taking the medications.  She is on several high risk medications including blood thinner and antiarrhythmic medications.  ROS: 10 point review of system has been reviewed and considered negative with exception was been listed in the HPI  Studies Reviewed EKG Interpretation Date/Time:  Tuesday January 28 2024 11:42:52 EDT Ventricular Rate:  70 PR Interval:  226 QRS Duration:  86 QT Interval:  396 QTC Calculation: 427 R Axis:   -19  Text Interpretation: Sinus rhythm with 1st degree A-V block Artifact When compared with ECG of 26-Dec-2023 09:56, No significant change was found Confirmed by Gerard Frederick ([id number]) on 01/28/2024 11:44:58 AM    Cardiac CT, 04/18/2023 1. There is normal pulmonary vein drainage into the left atrium. (2 on the right and 2 on the left) with ostial measurements as above.  2. The left atrial appendage is a Windsock-cactus type with ostial size 29 x 23 mm and length 31 mm, Area 49 mm2. There is no thrombus in the left atrial appendage.  3. The esophagus runs in the left atrial midline and is not in the proximity to any of the pulmonary veins.  4. Coronary calcium  score of 331. This was 85th percentile for age/gender.   TTE, 07/11/2022  1. Left ventricular ejection fraction, by estimation, is 50 to 55%. The left ventricle has low normal function. The left ventricle has no  regional wall motion abnormalities. There is mild left ventricular hypertrophy. Left ventricular diastolic  parameters are indeterminate.   2. Right ventricular systolic function is normal. The right ventricular size is normal. Tricuspid regurgitation signal is inadequate for assessing PA pressure.   3. Left atrial size was moderately dilated.   4. The mitral valve is normal in structure. No evidence of mitral valve regurgitation. No evidence of mitral stenosis.   5. The aortic valve is normal in structure. Aortic valve regurgitation is not visualized. No aortic stenosis is present.  Risk Assessment/Calculations  CHA2DS2-VASc Score = 4   This indicates a 4.8% annual risk of stroke. The patient's score is based upon: CHF History: 0 HTN History: 1 Diabetes History: 1 Stroke History: 0 Vascular Disease History: 0 Age Score: 1 Gender Score: 1        Physical Exam VS:  BP (!) 140/80 (BP Location: Left Arm, Patient Position: Sitting, Cuff Size: Normal)   Ht 5' 6 (1.676 m)  Wt 205 lb 6.4 oz (93.2 kg)   SpO2 98%   BMI 33.15 kg/m        Wt Readings from Last 3 Encounters:  01/28/24 205 lb 6.4 oz (93.2 kg)  01/02/24 206 lb (93.4 kg)  12/20/23 205 lb 12.8 oz (93.4 kg)    GEN: Well nourished, well developed in no acute distress NECK: No JVD; No carotid bruits CARDIAC: RRR, no murmurs, rubs, gallops RESPIRATORY:  Clear to auscultation without rales, wheezing or rhonchi  ABDOMEN: Soft, non-tender, non-distended EXTREMITIES:  No edema; No deformity   ASSESSMENT AND PLAN Persistent atrial fibrillation status post multiple direct-current cardioversion procedures and ablation procedure who also failed Tikosyn  and now currently on amiodarone .  She continues to complain of fatigue and shortness of breath and just feeling unwell.  EKG today reveals sinus rhythm with rate of 70 with first-degree AV block with no significant change.  Recent thyroid  chemistry was stable.  She is continued on  amiodarone  200 mg daily, metoprolol  tartrate 50 mg twice daily, digoxin  0.25 mg daily, and apixaban  5 mg twice daily for CHA2DS2-VASc score at least 4 for stroke prophylaxis.  Denies any bleeding with the blood noted in her urine or stool.  Secondary hypercoagulable state secondary to atrial fibrillation on oral anticoagulant therapy.  Primary hypertension with a blood pressure of 140/80.  Blood pressure slightly elevated today as patient is upset.  She is continued on metoprolol  tartrate 50 mg twice daily furosemide  20 mg daily.  She has been encouraged to continue to monitor her pressure 1 to 2 hours postmedication administration at home as well.  Type 2 diabetes which she is continued on Rybelsus , metformin , and Jardiance .  Ongoing management per PCP.  Generalized anxiety disorder which showed 7 of her medications been treated by psychiatry and a counselor.  She is encouraged to continue to maintain all follow-ups and maintain her current prescribed medication regimen.  She has been encouraged to continue to follow-up with her PCP about questions about medications today.  Obesity with a BMI of 33.15.  She would benefit from weight loss and has been under a lot of stress.  She has been advised to continue with dietary modification and increase her activity as tolerated.       Dispo: Patient to return to clinic to see me/APP in 4 weeks or sooner if needed for further evaluation  Signed, Elowen Debruyn, NP

## 2024-01-28 NOTE — Patient Instructions (Signed)
 Medication Instructions:  Your physician recommends that you continue on your current medications as directed. Please refer to the Current Medication list given to you today.   *If you need a refill on your cardiac medications before your next appointment, please call your pharmacy*  Lab Work: No labs ordered today  If you have labs (blood work) drawn today and your tests are completely normal, you will receive your results only by: MyChart Message (if you have MyChart) OR A paper copy in the mail If you have any lab test that is abnormal or we need to change your treatment, we will call you to review the results.  Testing/Procedures: No test ordered today   Follow-Up: At [facility], you and your health needs are our priority.  As part of our continuing mission to provide you with exceptional heart care, our providers are all part of one team.  This team includes your primary Cardiologist (physician) and Advanced Practice Providers or APPs (Physician Assistants and Nurse Practitioners) who all work together to provide you with the care you need, when you need it.  Your next appointment:   Reschedule Cindie to [facility] for 8 weeks Schedule OV follow up in 4 weeks w/ gen cards  Provider:   You may see Deatrice Cage, MD or one of the following Advanced Practice Providers on your designated Care Team:   Lonni Meager, NP Lesley Maffucci, PA-C Bernardino Bring, PA-C Cadence Meredosia, PA-C Tylene Lunch, NP Barnie Hila, NP

## 2024-01-28 NOTE — Progress Notes (Deleted)
  Electrophysiology Office Follow up Visit Note:    Date:  01/28/2024   ID:  Lisa Gutierrez, DOB [birth year], MRN [id number]  PCP:  Gayle Saddie FALCON, PA-C  [facility] Cardiologist:  Deatrice Cage, MD  [facility] Electrophysiologist:  [name], MD    Interval History:     Lisa Gutierrez is a 73 y.o. female who presents for a follow up visit.   The patient was last seen by [place] in the A-fib clinic on December 20, 2023.  She has a history of hypertension, diabetes and atrial fibrillation.  She had a prior catheter ablation in January 2025.  She did have a cardioversion in February.  She was subsequently loaded on Tikosyn  in July with a follow-up cardioversion on October 31, 2023.  Her Tikosyn  was later transition to amiodarone  and at the time of her last appointment with Daril she was taking 200 mg of amiodarone  daily.  She presents to discuss repeat ablation.  During her January 2025 ablation, the pulmonary veins and posterior wall were isolated.      Past medical, surgical, social and family history were reviewed.  ROS:   Please see the history of present illness.    All other systems reviewed and are negative.  EKGs/Labs/Other Studies Reviewed:    The following studies were reviewed today:          Physical Exam:    VS:  There were no vitals taken for this visit.    Wt Readings from Last 3 Encounters:  01/02/24 206 lb (93.4 kg)  12/20/23 205 lb 12.8 oz (93.4 kg)  11/28/23 205 lb (93 kg)     GEN: no distress CARD: RRR, No MRG RESP: No IWOB. CTAB.      ASSESSMENT:    No diagnosis found. PLAN:    In order of problems listed above:  #Persistent atrial fibrillation #High risk med monitoring-amiodarone  Symptomatic.  Persists despite treatment with amiodarone  and multiple cardioversions.  Prior catheter ablation in January 2025 targeted the pulmonary veins and posterior wall.  Now on Eliquis  5 mg by mouth twice daily for stroke  prophylaxis.  I discussed treatment options for her arrhythmia during today's clinic appointment including continuing with antiarrhythmic drug therapy and pursuing repeat catheter ablation.  Discussed treatment options today for AF including antiarrhythmic drug therapy and ablation. Discussed risks, recovery and likelihood of success with each treatment strategy. Risk, benefits, and alternatives to EP study and ablation for afib were discussed. These risks include but are not limited to stroke, bleeding, vascular damage, tamponade, perforation, damage to the esophagus, lungs, phrenic nerve and other structures, pulmonary vein stenosis, worsening renal function, coronary vasospasm and death.  Discussed potential need for repeat ablation procedures and antiarrhythmic drugs after an initial ablation. The patient understands these risk and wishes to proceed.  We will therefore proceed with catheter ablation at the next available time.  Carto, ICE, anesthesia are requested for the procedure.    We will obtain CT PV protocol prior to the procedure.  Recent blood work in September showed stable LFTs and TSH.     Signed, OLE HOLTS, MD, [facility], [facility] 01/28/2024 7:59 AM    Electrophysiology [facility]

## 2024-01-29 ENCOUNTER — Ambulatory Visit: Admitting: Cardiology

## 2024-01-29 ENCOUNTER — Ambulatory Visit: Payer: Self-pay

## 2024-01-29 ENCOUNTER — Other Ambulatory Visit (HOSPITAL_COMMUNITY): Payer: Self-pay

## 2024-01-29 NOTE — Telephone Encounter (Signed)
 FYI Only or Action Required?: FYI only for provider.  Patient was last seen in primary care on 01/02/2024 by Gayle Saddie FALCON, PA-C.  Called Nurse Triage reporting Medication Reaction.  Symptoms began several days ago.  Symptoms are: gradually worsening.  Triage Disposition: Go to ED Now (or PCP Triage)  Patient/caregiver understands and will follow disposition?: No, wishes to speak with PCP        Copied from CRM #[id number]. Topic: Clinical - Red Word Triage >> Jan 29, 2024 10:31 AM Winona R wrote:  Pt is in tears, confused and overwhelmed with her medications. Medications were pre packaged and she no longer recognize some of the medications. In result to her confusion she is feeling side effects of Dizziness, vurtigo, and headaches. Pt does not know if she's taking too much or not enough of a medication. Reason for Disposition  SEVERE dizziness (e.g., unable to stand, requires support to walk, feels like passing out now)  Answer Assessment - Initial Assessment Questions This RN recommends ED but pt refused. Pt wants a call from [place], [place]. This RN notified [facility] of pt refusal of ED disposition.   Pt states she has an empty bottle of venlafaxine  150 mg. Pt states she needs a refill of the effexor  150 mg. PT has the effexor  75 mg. Pt states she takes both. Pt complains of withdrawal symptoms from effexor - crying, brain zaps, intermittent headaches, dizziness (unable to drive due to this). Pt states her whole pill stuff is a mess. Pt states when she started doing the pill pocket thing a lot of the medication she doesn't recognize. Pt crying on phone.  Protocols used: Information Only Call - No Triage-A-AH, Dizziness - Lightheadedness-A-AH

## 2024-01-30 ENCOUNTER — Other Ambulatory Visit (HOSPITAL_COMMUNITY): Payer: Self-pay

## 2024-01-30 ENCOUNTER — Encounter (HOSPITAL_COMMUNITY): Payer: Self-pay | Admitting: Pharmacist

## 2024-01-31 ENCOUNTER — Other Ambulatory Visit (HOSPITAL_COMMUNITY): Payer: Self-pay

## 2024-02-03 ENCOUNTER — Other Ambulatory Visit (HOSPITAL_COMMUNITY): Payer: Self-pay

## 2024-02-03 ENCOUNTER — Other Ambulatory Visit: Payer: Self-pay

## 2024-02-04 ENCOUNTER — Other Ambulatory Visit (HOSPITAL_COMMUNITY): Payer: Self-pay

## 2024-02-04 ENCOUNTER — Other Ambulatory Visit: Payer: Self-pay

## 2024-02-06 ENCOUNTER — Ambulatory Visit: Admitting: Licensed Clinical Social Worker

## 2024-02-10 ENCOUNTER — Other Ambulatory Visit: Payer: Self-pay

## 2024-02-12 ENCOUNTER — Other Ambulatory Visit (HOSPITAL_COMMUNITY): Payer: Self-pay

## 2024-02-12 ENCOUNTER — Other Ambulatory Visit: Payer: Self-pay

## 2024-02-12 DIAGNOSIS — F411 Generalized anxiety disorder: Secondary | ICD-10-CM

## 2024-02-12 MED ORDER — ALPRAZOLAM 0.5 MG PO TABS
0.5000 mg | ORAL_TABLET | Freq: Every evening | ORAL | 0 refills | Status: DC | PRN
  Filled 2024-02-12: qty 30, 30d supply, fill #0

## 2024-02-13 ENCOUNTER — Other Ambulatory Visit: Payer: Self-pay

## 2024-02-13 ENCOUNTER — Other Ambulatory Visit (HOSPITAL_COMMUNITY): Payer: Self-pay

## 2024-02-13 ENCOUNTER — Other Ambulatory Visit (HOSPITAL_BASED_OUTPATIENT_CLINIC_OR_DEPARTMENT_OTHER): Payer: Self-pay

## 2024-02-14 ENCOUNTER — Ambulatory Visit (INDEPENDENT_AMBULATORY_CARE_PROVIDER_SITE_OTHER): Admitting: Licensed Clinical Social Worker

## 2024-02-14 DIAGNOSIS — Z91199 Patient's noncompliance with other medical treatment and regimen due to unspecified reason: Secondary | ICD-10-CM

## 2024-02-14 NOTE — Progress Notes (Signed)
  Clinician attempted session via telehealth, but Lisa Gutierrez did not join. Cln waited 15 minutes after appointment for start but patient never appeared. Per [employer], s/he will be charged a no-show fee.

## 2024-02-17 ENCOUNTER — Other Ambulatory Visit (HOSPITAL_COMMUNITY): Payer: Self-pay

## 2024-02-18 DIAGNOSIS — M67432 Ganglion, left wrist: Secondary | ICD-10-CM | POA: Diagnosis not present

## 2024-02-19 ENCOUNTER — Other Ambulatory Visit: Payer: Self-pay

## 2024-02-21 ENCOUNTER — Other Ambulatory Visit: Payer: Self-pay | Admitting: Family Medicine

## 2024-02-21 DIAGNOSIS — E1165 Type 2 diabetes mellitus with hyperglycemia: Secondary | ICD-10-CM

## 2024-02-24 ENCOUNTER — Ambulatory Visit (INDEPENDENT_AMBULATORY_CARE_PROVIDER_SITE_OTHER): Admitting: Licensed Clinical Social Worker

## 2024-02-24 ENCOUNTER — Other Ambulatory Visit (HOSPITAL_COMMUNITY): Payer: Self-pay

## 2024-02-24 DIAGNOSIS — F331 Major depressive disorder, recurrent, moderate: Secondary | ICD-10-CM | POA: Diagnosis not present

## 2024-02-24 DIAGNOSIS — F4310 Post-traumatic stress disorder, unspecified: Secondary | ICD-10-CM

## 2024-02-24 DIAGNOSIS — F411 Generalized anxiety disorder: Secondary | ICD-10-CM
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
## 2024-02-25 ENCOUNTER — Other Ambulatory Visit: Payer: Self-pay

## 2024-02-26 ENCOUNTER — Telehealth: Payer: Self-pay

## 2024-02-26 NOTE — Telephone Encounter (Signed)
 PAP: Patient assistance application for [employer] through [employer]) has been mailed to pt's home address on file. Provider portion of application will be faxed to provider's office.

## 2024-02-28 ENCOUNTER — Other Ambulatory Visit (HOSPITAL_COMMUNITY): Payer: Self-pay

## 2024-02-28 ENCOUNTER — Other Ambulatory Visit: Payer: Self-pay

## 2024-02-28 NOTE — Progress Notes (Signed)
 Lisa Gutierrez                                          MRN: [id number]   02/28/2024   The [facility] Quality Team Specialist reviewed this patient medical record for the purposes of chart review for care gap closure. The following were reviewed: chart review for care gap closure-kidney health evaluation for diabetes:eGFR  and uACR.    [facility] Quality Team

## 2024-03-03 ENCOUNTER — Other Ambulatory Visit: Payer: Self-pay

## 2024-03-03 ENCOUNTER — Other Ambulatory Visit (HOSPITAL_COMMUNITY): Payer: Self-pay

## 2024-03-04 ENCOUNTER — Other Ambulatory Visit: Payer: Self-pay

## 2024-03-04 DIAGNOSIS — E1165 Type 2 diabetes mellitus with hyperglycemia: Secondary | ICD-10-CM

## 2024-03-04 MED ORDER — METFORMIN HCL ER 500 MG PO TB24
500.0000 mg | ORAL_TABLET | Freq: Two times a day (BID) | ORAL | 1 refills | Status: DC
  Filled 2024-03-04: qty 60, 30d supply, fill #0
  Filled 2024-04-01: qty 60, 30d supply, fill #1

## 2024-03-05 ENCOUNTER — Ambulatory Visit: Attending: Cardiology | Admitting: Cardiology

## 2024-03-05 ENCOUNTER — Other Ambulatory Visit: Payer: Self-pay

## 2024-03-05 ENCOUNTER — Encounter: Payer: Self-pay | Admitting: Cardiology

## 2024-03-05 VITALS — BP 124/68 | HR 64 | Ht 65.0 in | Wt 203.0 lb

## 2024-03-05 DIAGNOSIS — E1165 Type 2 diabetes mellitus with hyperglycemia: Secondary | ICD-10-CM

## 2024-03-05 DIAGNOSIS — F411 Generalized anxiety disorder: Secondary | ICD-10-CM

## 2024-03-05 DIAGNOSIS — D6869 Other thrombophilia: Secondary | ICD-10-CM

## 2024-03-05 DIAGNOSIS — E669 Obesity, unspecified: Secondary | ICD-10-CM | POA: Diagnosis not present

## 2024-03-05 DIAGNOSIS — I10 Essential (primary) hypertension: Secondary | ICD-10-CM | POA: Diagnosis not present

## 2024-03-05 DIAGNOSIS — I4819 Other persistent atrial fibrillation: Secondary | ICD-10-CM | POA: Diagnosis not present

## 2024-03-05 NOTE — Telephone Encounter (Signed)
 Spoke to patient this morning offered her 3:10 this afternoon she declined stating she had another appt this morning. Also offered  Wednesday and she stated that she would call back if at another time.

## 2024-03-05 NOTE — Patient Instructions (Signed)
 Medication Instructions:  Your physician recommends that you continue on your current medications as directed. Please refer to the Current Medication list given to you today.   *If you need a refill on your cardiac medications before your next appointment, please call your pharmacy*  Lab Work: No labs ordered today  If you have labs (blood work) drawn today and your tests are completely normal, you will receive your results only by: MyChart Message (if you have MyChart) OR A paper copy in the mail If you have any lab test that is abnormal or we need to change your treatment, we will call you to review the results.  Testing/Procedures: No test ordered today   Follow-Up: At [facility], you and your health needs are our priority.  As part of our continuing mission to provide you with exceptional heart care, our providers are all part of one team.  This team includes your primary Cardiologist (physician) and Advanced Practice Providers or APPs (Physician Assistants and Nurse Practitioners) who all work together to provide you with the care you need, when you need it.  Your next appointment:   6 week(s)  Provider:   You may see Deatrice Cage, MD or one of the following Advanced Practice Providers on your designated Care Team:   Tylene Lunch, NP

## 2024-03-05 NOTE — Telephone Encounter (Signed)
 I am happy to see patient either today at 3:10 or next Wednesday at 11:10.

## 2024-03-05 NOTE — Telephone Encounter (Signed)
 Received provider portion PAP application for [employer]  marionette)

## 2024-03-05 NOTE — Progress Notes (Signed)
 Cardiology Office Note   Date:  03/05/2024  ID:  Lisa Gutierrez, Lisa Gutierrez Lisa Gutierrez 21, 1952, MRN [id number] Lisa Gutierrez: Lisa Gutierrez Lisa Gutierrez  [facility] Providers Cardiologist:  Lisa Cage, MD Cardiology APP:  Lisa Frederick, NP  Electrophysiologist:  Lisa ONEIDA HOLTS, MD     History of Present Illness Lisa Gutierrez is a 73 y.o. female with a past medical history of essential hypertension, chronic electrolyte abnormalities, GERD, IBS, chronic superficial gastritis without bleeding, fatty liver disease, type 2 diabetes, GAD, insomnia, chronic history of statin intolerance, morbid obesity, persistent atrial fibrillation status post multiple DCCV (08/2022, 06/03/2023, 10/31/2023, and 12/26/2023) with ablation (05/02/2023, and multiple medication failures, who is here today for follow-up.   She had previously presented for cataract surgery on 07/10/2022 was found to be in atrial fibrillation with RVR by anesthesia.  Heart rate was 1 70-1 9 past time and procedure was subsequently canceled.  Echocardiogram revealed an LVEF of 50-55%, no RWMA, no valvular abnormalities were noted.  She was started on metoprolol , dig, and apixaban  and subsequently discharged on 07/04/2022.  She followed up in clinic on July 16, 2022 where she been tolerating her apixaban  and beta-blockers without incident she was scheduled for cardioversion procedure after being on anticoagulant with 4 weeks uninterrupted.  She did previously undergone cardioversion that was successful for generalized vision she was noted to be back in atrial fibrillation.   She was last seen in clinic 11/30/2022 she continued to be tearful with complaints of being depressed, weak, fatigued, anxious, with multiple questions about her medications.  She was continued on apixaban  for CHA2DS2-VASc of at least 5 for stroke prophylaxis.  EKG revealed she was still in atrial fibrillation.  She was apprehensive about repeat cardioversion she was referred to  EP.  She was seen in clinic by EP on 02/27/2023 discussed treatment options for atrial fibrillation including antiarrhythmic drug therapy and ablation.  After discussing risk, recovery, and likelihood of success with the treatment strategy she had opted for the ablation procedure.  This was to be scheduled for 05/02/2023.Lisa [facility]  She presented to the [facility] emergency department 1/23 with shortness of breath, hypoxic.  PCR swab was positive for RSV.  Chest x-ray concerning for pneumonia.  She continued to remain in sinus rhythm on presentation.  She was admitted overnight.  Since discharge she had an ongoing cough that improved with Mucinex .  She noticed a catching sensation in her chest and that she was back in A-fib.  She states she was diligent about taking her apixaban  twice daily and was scheduled for repeat cardioversion.  She was seen in clinic by EP on 05/30/2023 for routine 4-week post ablation appointment where she was in A-fib.  She was status post DCCV with return to sinus rhythm.  She was then followed up and was not aware of any A-fib episodes.  She does not radiate, or catching sensation in her checks.  Blood pressure has been well-controlled.  Upper respiratory infection with increased nasal drainage and some facial tenderness and cough.  She questions what medications that she can safely take at the time.  Continue to diligently take her apixaban  with no missed doses.  She was to continue to maintain sinus rhythm continue on metoprolol  75 mg twice daily and apixaban  5 mg twice daily.  For blood pressure she was continued on losartan  25 mg daily.  She was seen in clinic 07/24/23 states she been doing well from a cardiac perspective.  She continues to have occasional sensation  which only she was unable to catch her breath.  States she been compliant with her current medication regimen.  She was sent for an updated digoxin  level and was continued on her current medication regimen with no further testing  that was ordered at that time.   She was seen by EP 08/21/2023.  She was doing well without cardiac complaints.  She brought in a BP and pulse log with blood pressure range 120/130 systolic.  Pulse was ranging 50-70.  She denies chest pain, chest pressure or palpitations.  No increased swelling.  She was continued on metoprolol  75 mg twice daily, losartan  25 mg daily and apixaban  5 mg twice daily.  There were no changes made to her medication regimen and no further testing that was ordered.  She was evaluated on 09/10/2023 in clinic after she contracted several times with elevated blood pressures and heart rates have increased fatigue and shortness of breath as well as palpitations.  She had recently undergone 2 successful cardioversions and then had an ablation procedure and was previously maintaining sinus rhythm at the beginning of the month her EP appointment.  She states that she had notified that she converted back into atrial fibrillation and all of her symptoms started again.  She stated that her anxiety was worse and she felt that difficult to differentiate between anxiety and her atrial fibrillation.  EKG was done and she was noted to be back in atrial fibrillation with rates of 120s to 130s.  She was being sent for updated labs and scheduled for an updated appointment with EP to discuss antiarrhythmics versus repeat ablation procedure with EP.  She was evaluated by [facility] on 09/25/2023 of and she was interested in avoiding another catheter ablation which was deemed reasonable.  She had opted for hospital admission and Tikosyn  initiation.  She was going to be admitted to the hospital 10/29/2023 5 to start Tikosyn  therapy.  She was hospitalized at [facility] for persistent atrial fibrillation status post Tikosyn  loading.  She underwent successful direct-current cardioversion on 10/31/2023 by Dr. Bertrum which successfully restored sinus rhythm.  There were no apparent early  complications noted.  Renal function and electrolytes were followed during her hospitalization.  Her QTc remained stable.  She was considered stable and was able to be discharged home on 11/01/2023 with 1 week follow-up.  She was admitted to [facility] 7/22 through 11/03/2023 with atrial fibrillation with RVR that was likely triggered by recent upper respiratory infection.  She was restarted on her Tikosyn , beta-blocker and digoxin  as well as apixaban .  Recommended with outpatient cardioversion.  Chest x-ray did not show any pneumonia blood cultures were negative and COVID and influenza were negative.  She was discharged home with outpatient follow-up in 1 week.  She was evaluated in A-fib clinic on 11/11/2023.  She was scheduled for a cardioversion procedure and continued on apixaban , Tikosyn , dig, and Lopressor .  Patient underwent elective cardioversion and was shocked 1 time at 200 J and converted to normal sinus rhythm.  Metoprolol  was reduced to 50 mg twice daily due to sinus bradycardia and 50 bpm status post procedure.  There were no immediate postoperative complications noted.  She followed up with EP status post cardioversion procedure status post 1 month of Tikosyn  visit as well.  EKG revealed atrial fibrillation with 118 bpm.  She was considered to have Tikosyn  failure.  Tikosyn  was stopped and she was to start amiodarone  on Sunday 8/17 at 200 mg twice daily for  2 weeks and then 200 mg daily.  Borderline LFTs and TSH.  Apixaban  was continued at 5 mg twice daily.  She returned to A-fib clinic on 12/20/2023 continue to deny symptoms of palpitations chest pain shortness of breath orthopnea.  Continued on amiodarone , apixaban , digoxin , and Lopressor .  Sent for updated labs.  In the long-term she was considering repeat ablation.  Discussed rhythm control options and discussed repeat cardioversion procedure after load on amiodarone .  She underwent direct-current cardioversion procedure on 12/26/2023 and shocked  at 200 J and converted to sinus rhythm first-degree AV block.  There were no immediate postoperative complications noted.  She was last seen in clinic 01/28/2024 at that time she was tearful depressed and confused about her medications.  She just recently had COVID and continues to endorse extreme fatigue and brain fog.  She denies any chest pain, palpitations or shortness of breath.  EKG revealed she had remained in sinus rhythm.  She was continued on her current medication regimen without any changes made and no further testing was ordered at the time.   She returns to clinic today in much better spirits than her prior visit.  States that she continues to have of brain fog and fatigue since her fourth episode of contracting COVID.  She denies any chest pain, chest pressure, peripheral edema shortness of breath.  She still is confused about some of her medications saying that she has been talking to the pharmacy about her pill pack medications and they have failed that she is missing several of them.  She states as far she knows she has been compliant with all of her medications at home has not missed any doses of her blood thinner.  She denies any bleeding with no blood noted in her urine or stool.  She denies any hospitalizations or visits to the emergency department.  ROS: 10 point review of system has been reviewed and considered negative the exception was been listed in the HPI  Studies Reviewed EKG Interpretation Date/Time:  Thursday March 05 2024 11:31:14 EST Ventricular Rate:  64 PR Interval:  194 QRS Duration:  86 QT Interval:  396 QTC Calculation: 408 R Axis:   -22  Text Interpretation: Normal sinus rhythm Normal ECG When compared with ECG of 28-Jan-2024 11:42, No significant change was found Confirmed by Lisa Gutierrez ([id number]) on 03/05/2024 11:34:25 AM    Cardiac CT, 04/18/2023 1. There is normal pulmonary vein drainage into the left atrium. (2 on the right and 2 on the left) with  ostial measurements as above.  2. The left atrial appendage is a Windsock-cactus type with ostial size 29 x 23 mm and length 31 mm, Area 49 mm2. There is no thrombus in the left atrial appendage.  3. The esophagus runs in the left atrial midline and is not in the proximity to any of the pulmonary veins.  4. Coronary calcium  score of 331. This was 85th percentile for age/gender.   TTE, 07/11/2022  1. Left ventricular ejection fraction, by estimation, is 50 to 55%. The left ventricle has low normal function. The left ventricle has no regional wall motion abnormalities. There is mild left ventricular hypertrophy. Left ventricular diastolic  parameters are indeterminate.   2. Right ventricular systolic function is normal. The right ventricular size is normal. Tricuspid regurgitation signal is inadequate for assessing PA pressure.   3. Left atrial size was moderately dilated.   4. The mitral valve is normal in structure. No evidence of mitral valve regurgitation. No  evidence of mitral stenosis.   5. The aortic valve is normal in structure. Aortic valve regurgitation is not visualized. No aortic stenosis is present.  Risk Assessment/Calculations  CHA2DS2-VASc Score = 4   This indicates a 4.8% annual risk of stroke. The patient's score is based upon: CHF History: 0 HTN History: 1 Diabetes History: 1 Stroke History: 0 Vascular Disease History: 0 Age Score: 1 Gender Score: 1            Physical Exam VS:  BP 124/68 (BP Location: Left Arm, Patient Position: Sitting, Cuff Size: Normal)   Pulse 64 Comment: 65 oximeter  Ht 5' 5 (1.651 m)   Wt 203 lb (92.1 kg)   SpO2 99%   BMI 33.78 kg/m        Wt Readings from Last 3 Encounters:  03/05/24 203 lb (92.1 kg)  01/28/24 205 lb 6.4 oz (93.2 kg)  01/02/24 206 lb (93.4 kg)    GEN: Well nourished, well developed in no acute distress NECK: No JVD; No carotid bruits CARDIAC: RRR, no murmurs, rubs, gallops RESPIRATORY:  Clear to auscultation  without rales, wheezing or rhonchi  ABDOMEN: Soft, non-tender, non-distended EXTREMITIES:  No edema; No deformity   ASSESSMENT AND PLAN Persistent atrial fibrillation status post multiple direct-current cardioversion procedures and ablation procedure has failed Tikosyn  and now currently on amiodarone .  She states that she continues to have fatigue since having COVID.  Her home blood pressure cuff often times and has to pump up and take her blood pressure twice and she was advised that could mean she is back in A-fib.  EKG today reveals sinus rhythm with a rate of 64 with no ischemic changes.  She is continued on amiodarone  200 mg daily, metoprolol  tartrate 50 mg twice daily, dig 0.25 mg daily, and apixaban  5 mg twice daily for CHA2DS2-VASc score of at least 4 for stroke prophylaxis.  She has done well with her blood thinner without any issues of bleeding.  Secondary hypercoagulable state related to atrial fibrillation on oral anticoagulant therapy.  Primary hypertension with a blood pressure of 120/68.  Blood pressure remains well-controlled.  She also brought her blood pressure log with her that shows well-regulated blood pressures.  She is continued on her current medication regimen without changes today.  She has been encouraged to continue to monitor pressures 1 to 2 hours postmedication administration as well.  Type 2 diabetes where she is continued on Rybelsus , metformin , Jardiance .  Ongoing management per [facility].  Generalized anxiety disorder which she continues to be treated by psychiatry and her counselor.  She feels as though she is not getting what she needs from the current providers that she has.  She has discussed her concerns with her [facility] and they are looking at possibly making medication changes.  Obesity with a BMI of 33.78.  She would benefit from weight loss and has been under a lot of stress.  She has continued to increase her activity and dietary modifications.       Dispo: Patient  to return to clinic see MD/APP in 6 weeks or sooner after visit with EP APP as she would like to discuss potentially changing some medications.  Medications were not changed today as she continues to maintain sinus rhythm.  Signed, Vauda Salvucci, NP

## 2024-03-05 NOTE — Telephone Encounter (Signed)
 Will call patient back. She had to get off the phone with me so she could go to her other doctor's appointment.

## 2024-03-06 ENCOUNTER — Other Ambulatory Visit: Payer: Self-pay

## 2024-03-06 ENCOUNTER — Other Ambulatory Visit (HOSPITAL_COMMUNITY): Payer: Self-pay

## 2024-03-06 MED ORDER — METOPROLOL TARTRATE 50 MG PO TABS
50.0000 mg | ORAL_TABLET | Freq: Two times a day (BID) | ORAL | 3 refills | Status: AC
Start: 2024-03-06 — End: ?
  Filled 2024-03-06: qty 60, 30d supply, fill #0
  Filled 2024-04-01: qty 60, 30d supply, fill #1
  Filled 2024-05-06 – 2024-05-07 (×2): qty 60, 30d supply, fill #2

## 2024-03-09 ENCOUNTER — Other Ambulatory Visit: Payer: Self-pay

## 2024-03-09 ENCOUNTER — Other Ambulatory Visit (HOSPITAL_COMMUNITY): Payer: Self-pay

## 2024-03-10 ENCOUNTER — Other Ambulatory Visit (HOSPITAL_COMMUNITY): Payer: Self-pay

## 2024-03-10 ENCOUNTER — Other Ambulatory Visit: Payer: Self-pay

## 2024-03-11 ENCOUNTER — Other Ambulatory Visit: Payer: Self-pay

## 2024-03-11 ENCOUNTER — Other Ambulatory Visit (HOSPITAL_COMMUNITY): Payer: Self-pay

## 2024-03-16 NOTE — Telephone Encounter (Signed)
 Do not recommend stopping Eliquis  for single dental extractions.

## 2024-03-23 NOTE — Progress Notes (Deleted)
  Electrophysiology Office Follow up Visit Note:    Date:  03/23/2024   ID:  Lisa Gutierrez, DOB [birth year], MRN [id number]  PCP:  Gayle Saddie FALCON, PA-C  [facility] Cardiologist:  Deatrice Cage, MD  [facility] Electrophysiologist:  [name], MD    Interval History:     Lisa Gutierrez is a 73 y.o. female who presents for a follow up visit.   The patient last saw [facility] December 20, 2023.  She has a history of atrial fibrillation, diabetes and hypertension.  She had an A-fib ablation May 02, 2023.  She had to have a cardioversion following the procedure.  She was loaded on amiodarone .  A cardioversion was scheduled at the appoint with Daril in September.  She takes Eliquis  for stroke prophylaxis. Her cardioversion took place December 26, 2023. She saw Joen in clinic January 28, 2024.  At that appointment she was in sinus rhythm.      Past medical, surgical, social and family history were reviewed.  ROS:   Please see the history of present illness.    All other systems reviewed and are negative.  EKGs/Labs/Other Studies Reviewed:    The following studies were reviewed today:          Physical Exam:    VS:  There were no vitals taken for this visit.    Wt Readings from Last 3 Encounters:  03/05/24 203 lb (92.1 kg)  01/28/24 205 lb 6.4 oz (93.2 kg)  01/02/24 206 lb (93.4 kg)     GEN: no distress CARD: RRR, No MRG RESP: No IWOB. CTAB.      ASSESSMENT:    No diagnosis found. PLAN:    In order of problems listed above:  #Persistent atrial fibrillation #High risk medication monitoring-amiodarone  Prior catheter ablation.  Failed dofetilide .  Now on amiodarone .  Maintaining sinus rhythm after September cardioversion.*** Continue Eliquis  for stroke prophylaxis Update CMP, TSH and free T4***  #Hypertension *** goal today.  Recommend checking blood pressures 1-2 times per week at home and recording the values.  Recommend  bringing these recordings to the primary care physician.  I discussed my upcoming departure from [name] during today's clinic appointment.  The patient will continue to follow-up with one of my EP partners moving forward.  Follow-up with EP APP in 6 months    Signed, Ole Holts, MD, [facility], [facility] 03/23/2024 2:36 PM    Electrophysiology [facility]

## 2024-03-24 ENCOUNTER — Ambulatory Visit: Admitting: Cardiology

## 2024-03-24 NOTE — Telephone Encounter (Signed)
 PAP: Application for [employer] has been submitted to [employer]), via fax

## 2024-03-25 ENCOUNTER — Ambulatory Visit: Admitting: Cardiology

## 2024-03-25 ENCOUNTER — Other Ambulatory Visit: Payer: Self-pay

## 2024-03-25 ENCOUNTER — Other Ambulatory Visit (HOSPITAL_COMMUNITY): Payer: Self-pay

## 2024-03-25 DIAGNOSIS — Z5181 Encounter for therapeutic drug level monitoring: Secondary | ICD-10-CM

## 2024-03-25 DIAGNOSIS — I4819 Other persistent atrial fibrillation: Secondary | ICD-10-CM

## 2024-03-25 DIAGNOSIS — F411 Generalized anxiety disorder: Secondary | ICD-10-CM

## 2024-03-25 DIAGNOSIS — I10 Essential (primary) hypertension: Secondary | ICD-10-CM

## 2024-03-25 NOTE — Therapy (Deleted)
 OUTPATIENT OCCUPATIONAL THERAPY ORTHO EVALUATION  Patient Name: Lisa Gutierrez MRN: [id number] DOB:1950/06/26, 73 y.o., female Today's Date: 03/25/2024  PCP:  Gayle PA REFERRING PROVIDER: Dr Ezra  END OF SESSION:   Past Medical History:  Diagnosis Date   Anxiety    Atrial fibrillation (HCC)    Depression    Depression    Phreesia 07/02/2020   Diabetes mellitus without complication (HCC)    GERD (gastroesophageal reflux disease)    Hypertension    IBS (irritable bowel syndrome)    Neuromuscular disorder (HCC)    Past Surgical History:  Procedure Laterality Date   ATRIAL FIBRILLATION ABLATION N/A 05/02/2023   Procedure: ATRIAL FIBRILLATION ABLATION;  Surgeon: Cindie Ole DASEN, MD;  Location: [facility];  Service: Cardiovascular;  Laterality: N/A;   BREAST EXCISIONAL BIOPSY     CARDIOVERSION N/A 09/03/2022   Procedure: CARDIOVERSION;  Surgeon: Darron Deatrice LABOR, MD;  Location: [facility];  Service: Cardiovascular;  Laterality: N/A;   CARDIOVERSION N/A 06/03/2023   Procedure: CARDIOVERSION;  Surgeon: Darliss Rogue, MD;  Location: [facility];  Service: Cardiovascular;  Laterality: N/A;   CARDIOVERSION N/A 10/31/2023   Procedure: CARDIOVERSION;  Surgeon: Lonni Slain, MD;  Location: [facility] CV [facility];  Service: Cardiovascular;  Laterality: N/A;   CARDIOVERSION N/A 11/15/2023   Procedure: CARDIOVERSION;  Surgeon: Francyne Headland, MD;  Location: [facility];  Service: Cardiovascular;  Laterality: N/A;   CARDIOVERSION N/A 12/26/2023   Procedure: CARDIOVERSION;  Surgeon: Santo Stanly LABOR, MD;  Location: [facility];  Service: Cardiovascular;  Laterality: N/A;   CATARACT EXTRACTION W/PHACO Right 07/24/2022   Procedure: CATARACT EXTRACTION PHACO AND INTRAOCULAR LENS PLACEMENT (IOC) RIGHT DIABETIC  5.93  00:42.5;  Surgeon: Jaye Fallow, MD;  Location: [facility];  Service: Ophthalmology;  Laterality: Right;   CATARACT EXTRACTION  W/PHACO Left 08/07/2022   Procedure: CATARACT EXTRACTION PHACO AND INTRAOCULAR LENS PLACEMENT (IOC) LEFT DIABETIC  12.33  00:58.3;  Surgeon: Jaye Fallow, MD;  Location: [facility];  Service: Ophthalmology;  Laterality: Left;   CESAREAN SECTION N/A    Phreesia 07/02/2020   CHOLECYSTECTOMY     EXCISION / BIOPSY BREAST / NIPPLE / DUCT Right 1973   duct removed   SPINE SURGERY N/A    Phreesia 07/02/2020   TUBAL LIGATION N/A    Phreesia 07/02/2020   Patient Active Problem List   Diagnosis Date Noted   Hypokalemia 01/02/2024   Lactic acid increased 11/05/2023   SIRS (systemic inflammatory response syndrome) (HCC) 11/05/2023   Morbid obesity (HCC) 11/05/2023   Persistent atrial fibrillation (HCC) 06/03/2023   Sepsis without acute organ dysfunction (HCC) 05/09/2023   Hyperlipidemia associated with type 2 diabetes mellitus (HCC) 12/27/2022   Early satiety 12/27/2022   Atrial fibrillation with RVR (HCC) 07/10/2022   Prurigo nodularis 01/01/2022   Chronic pain of left knee 01/03/2021   Cyst of right ovary 12/09/2020   Class 2 severe obesity due to excess calories with serious comorbidity and body mass index (BMI) of 38.0 to 38.9 in adult 10/03/2020   Statin intolerance 03/06/2020   Vertigo 05/22/2019   Chronic allergic rhinitis 03/18/2019   Marital relationship problem 01/30/2019   MDD (major depressive disorder), recurrent, in partial remission 01/30/2019   Primary insomnia 02/06/2018   Hypertension associated with type 2 diabetes mellitus (HCC) 12/06/2017   GAD (generalized anxiety disorder) 12/06/2017   Gastroesophageal reflux disease without esophagitis 12/06/2017   Type 2 diabetes mellitus with hyperglycemia, without long-term current use of insulin  (  HCC) 12/06/2017   Fatty liver disease, nonalcoholic 08/29/2017   Chronic superficial gastritis without bleeding 11/23/2016   Irritable bowel syndrome with constipation 11/06/2016   External hemorrhoids 11/06/2016     ONSET DATE: ***  REFERRING DIAG: ***  THERAPY DIAG:  No diagnosis found.  Rationale for Evaluation and Treatment: {HABREHAB:27488}  SUBJECTIVE:   SUBJECTIVE STATEMENT: *** Pt accompanied by: self  PERTINENT HISTORY: 03/16/24 Dr Ezra visit Left Upper Extremity:  Pain to palpation over the first dorsal extensor compartment. Pain palpation at the first [facility] joint. Positive Eichhoff's, equivocal Finkelstein's, pain with resisted thumb extension. Mildly positive thumb CMC grind test and thumb adduction test. No tenderness to the A1 pulley of the thumb. No active triggering of the thumb. Patient demonstrates allodynia with light touch over the dorsal radial aspect of the thumb and wrist causing significant pain. Sensation intact to light touch but subjectively limited over the SBRN distribution. WWP.  Imaging and Results: Radiographs of the left hand obtained on 03/06/2024 were personally interpreted. There is evidence of Eaton stage II first CMC arthritis with joint space narrowing at the first [facility] joint, osteophyte formation, mild metacarpal subsidence.  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 Discussed potential for steroid injection to alleviate symptoms, though not guaranteed to resolve all issues. Emphasized importance of therapy to address nerve hypersensitivity, though transportation challenges limit access. - Administered steroid injection into the tendon sheath. - Recommended OT to address nerve hypersensitivity, with emphasis on at least one session to learn home exercises. - Advised home exercises to desensitize the nerve, including gentle touching and rubbing of the affected area.  Left wrist/thumb ganglion cyst Presence of a ganglion cyst at the left wrist/thumb area, previously aspirated and injected with steroid. Persistent pain suggests possible contribution to  current symptoms, though not the primary issue.  Left thumb basal joint osteoarthritis Mild osteoarthritis at the base of the left thumb, contributing to pain but not the primary source of symptoms.  Procedure Note - First Dorsal Extensor Compartment Injection - left wrist After discussing the various treatment options for the condition, It was agreed that a DeQuervain's injection would be the next step in treatment. The nature of and the indications for a corticosteroid and / or local anaesthetic injection were reviewed in detail with the patient today. The inherent risks of injection including infection, allergic reaction, increased pain, incomplete relief or temporary relief of symptoms, alterations of blood glucose levels requiring careful monitoring and treatment as indicated, tendon, ligament or articular cartilage rupture or degeneration, nerve injury, skin depigmentation, and/or fat atrophy were discussed.  After the risks and benefits of the procedure were explained, consent was given, and time-out was performed. The dorsal aspect of the left first extensor compartment at the level of the radial styloid was prepped with alcohol solution. The site was anesthetized with ethyl chloride. The compartment was injected with 1 mL of Betamethasone  6 mg/ mL and 1 mL of 1% Lidocaine . The procedure was well tolerated.    PRECAUTIONS: None   WEIGHT BEARING RESTRICTIONS: No  PAIN:  Are you having pain? {OPRCPAIN:27236}  FALLS: Has patient fallen in last 6 months? {fallsyesno:27318}  LIVING ENVIRONMENT: Lives with: {OPRC lives with:25569::lives with their family}   PLOF: {PLOF:24004}  PATIENT GOALS: ***  NEXT MD VISIT: ***  OBJECTIVE:  Note: Objective measures were completed at Evaluation unless otherwise noted.  HAND DOMINANCE: Right  ADLs: {ADLs OT:31716}  FUNCTIONAL OUTCOME  MEASURES: PRWHE pain and function  UPPER EXTREMITY ROM:     Active ROM Right eval Left eval   Shoulder flexion    Shoulder abduction    Shoulder adduction    Shoulder extension    Shoulder internal rotation    Shoulder external rotation    Elbow flexion    Elbow extension    Wrist flexion    Wrist extension    Wrist ulnar deviation    Wrist radial deviation    Wrist pronation    Wrist supination    (Blank rows = not tested)  Active ROM Right eval Left eval  Thumb MCP (0-60)    Thumb IP (0-80)    Thumb Radial abd/add (0-55)     Thumb Palmar abd/add (0-45)     Thumb Opposition to Small Finger     Index MCP (0-90)     Index PIP (0-100)     Index DIP (0-70)      Long MCP (0-90)      Long PIP (0-100)      Long DIP (0-70)      Ring MCP (0-90)      Ring PIP (0-100)      Ring DIP (0-70)      Little MCP (0-90)      Little PIP (0-100)      Little DIP (0-70)      (Blank rows = not tested)   UPPER EXTREMITY MMT:     MMT Right eval Left eval  Shoulder flexion    Shoulder abduction    Shoulder adduction    Shoulder extension    Shoulder internal rotation    Shoulder external rotation    Middle trapezius    Lower trapezius    Elbow flexion    Elbow extension    Wrist flexion    Wrist extension    Wrist ulnar deviation    Wrist radial deviation    Wrist pronation    Wrist supination    (Blank rows = not tested)  HAND FUNCTION: Grip strength: Right: *** lbs; Left: *** lbs, Lateral pinch: Right: *** lbs, Left: *** lbs, and 3 point pinch: Right: *** lbs, Left: *** lbs  COORDINATION: {otcoordination:27237}  SENSATION: {sensation:27233}  EDEMA: ***  COGNITION: Overall cognitive status: {cognition:24006}      TREATMENT DATE: 03/26/24                                                                                                                            Modalities: {OPRCMODALITIES:31717}     PATIENT EDUCATION: Education details: findings of eval and HEP  Person educated: Patient Education method: Explanation, Demonstration, Tactile cues,  Verbal cues, and Handouts Education comprehension: verbalized understanding, returned demonstration, verbal cues required, and needs further education   GOALS: Goals reviewed with patient? Yes  SHORT TERM GOALS: Target date: ***  *** Baseline: Goal status: INITIAL  2.  *** Baseline:  Goal status: INITIAL  LONG TERM GOALS:  Target date: ***  *** Baseline:  Goal status: INITIAL  2.  *** Baseline:  Goal status: INITIAL  3.  *** Baseline:  Goal status: INITIAL  4.  *** Baseline:  Goal status: INITIAL  5.  *** Baseline:  Goal status: INITIAL  6.  *** Baseline:  Goal status: INITIAL  ASSESSMENT:  CLINICAL IMPRESSION: Patient seen today for occupational therapy evaluation for ***.   PERFORMANCE DEFICITS: in functional skills including ADLs, IADLs, ROM, strength, pain, flexibility, decreased knowledge of use of DME, and UE functional use,   and psychosocial skills including environmental adaptation and routines and behaviors.   IMPAIRMENTS: are limiting patient from ADLs, IADLs, rest and sleep, play, leisure, and social participation.   COMORBIDITIES: has no other co-morbidities that affects occupational performance. Patient will benefit from skilled OT to address above impairments and improve overall function.  MODIFICATION OR ASSISTANCE TO COMPLETE EVALUATION: No modification of tasks or assist necessary to complete an evaluation.  OT OCCUPATIONAL PROFILE AND HISTORY: Problem focused assessment: Including review of records relating to presenting problem.  CLINICAL DECISION MAKING: LOW - limited treatment options, no task modification necessary  REHAB POTENTIAL: Good for goals  EVALUATION COMPLEXITY: Low     PLAN:  OT FREQUENCY: {rehab frequency:25116}  OT DURATION: {rehab duration:25117}  PLANNED INTERVENTIONS: 97168 OT Re-evaluation, 97535 self care/ADL training, 02889 therapeutic exercise, 97530 therapeutic activity, 97112 neuromuscular re-education,  97140 manual therapy, 97018 paraffin, 02960 fluidotherapy, 97034 contrast bath, 97760 Orthotic Initial, [id number] Orthotic/Prosthetic subsequent, scar mobilization, passive range of motion, patient/family education, and DME and/or AE instructions    CONSULTED AND AGREED WITH PLAN OF CARE: Patient     Ancel Peters, OTR/L,CLT 03/25/2024, 6:49 PM

## 2024-03-26 ENCOUNTER — Ambulatory Visit: Admitting: Occupational Therapy

## 2024-03-26 ENCOUNTER — Other Ambulatory Visit: Payer: Self-pay

## 2024-03-26 ENCOUNTER — Ambulatory Visit

## 2024-03-26 ENCOUNTER — Other Ambulatory Visit (HOSPITAL_COMMUNITY): Payer: Self-pay

## 2024-03-26 DIAGNOSIS — Z0000 Encounter for general adult medical examination without abnormal findings: Secondary | ICD-10-CM

## 2024-03-26 MED ORDER — ALPRAZOLAM 0.5 MG PO TABS
0.5000 mg | ORAL_TABLET | Freq: Every evening | ORAL | 0 refills | Status: DC | PRN
  Filled 2024-03-26: qty 30, 30d supply, fill #0

## 2024-03-26 NOTE — Patient Instructions (Addendum)
 Lisa Gutierrez,  Thank you for taking the time for your Medicare Wellness Visit. I appreciate your continued commitment to your health goals. Please review the care plan we discussed, and feel free to reach out if I can assist you further.  Please note that Annual Wellness Visits do not include a physical exam. Some assessments may be limited, especially if the visit was conducted virtually. If needed, we may recommend an in-person follow-up with your provider.  Ongoing Care Seeing your primary care provider every 3 to 6 months helps us  monitor your health and provide consistent, personalized care.   Referrals If a referral was made during today's visit and you haven't received any updates within two weeks, please contact the referred provider directly to check on the status.  Recommended Screenings:  Health Maintenance  Topic Date Due   Screening for Lung Cancer  Never done   Yearly kidney health urinalysis for diabetes  09/25/2023   Breast Cancer Screening  09/29/2023   COVID-19 Vaccine (4 - 2025-26 season) 12/16/2023   Complete foot exam   12/27/2023   Colon Cancer Screening  02/12/2024   Hemoglobin A1C  05/08/2024   Yearly kidney function blood test for diabetes  01/01/2025   Medicare Annual Wellness Visit  03/26/2025   DTaP/Tdap/Td vaccine (2 - Td or Tdap) 01/01/2034   Pneumococcal Vaccine for age over 55  Completed   Flu Shot  Completed   Osteoporosis screening with Bone Density Scan  Completed   Hepatitis C Screening  Completed   Zoster (Shingles) Vaccine  Completed   Meningitis B Vaccine  Aged Out       03/26/2024    1:29 PM  Advanced Directives  Does Patient Have a Medical Advance Directive? No  Would patient like information on creating a medical advance directive? No - Patient declined    Vision: Annual vision screenings are recommended for early detection of glaucoma, cataracts, and diabetic retinopathy. These exams can also reveal signs of chronic conditions such  as diabetes and high blood pressure.  Dental: Annual dental screenings help detect early signs of oral cancer, gum disease, and other conditions linked to overall health, including heart disease and diabetes.  Please see the attached documents for additional preventive care recommendations.

## 2024-03-26 NOTE — Progress Notes (Signed)
 Chief Complaint  Patient presents with   Medicare Wellness     Subjective:   Lisa Gutierrez is a 73 y.o. female who presents for a Medicare Annual Wellness Visit.  Visit info / Clinical Intake: Medicare Wellness Visit Type:: Subsequent Annual Wellness Visit Persons participating in visit and providing information:: patient Medicare Wellness Visit Mode:: Video Since this visit was completed virtually, some vitals may be partially provided or unavailable. Missing vitals are due to the limitations of the virtual format.: Unable to obtain vitals - no equipment If Telephone or Video please confirm:: I connected with patient using audio/video enable telemedicine. I verified patient identity with two identifiers, discussed telehealth limitations, and patient agreed to proceed. Patient Location:: home Provider Location:: home office Interpreter Needed?: No Pre-visit prep was completed: yes AWV questionnaire completed by patient prior to visit?: no Living arrangements:: lives with spouse/significant other Patient's Overall Health Status Rating: (!) fair Typical amount of pain: some (mouth pain) Does pain affect daily life?: no Are you currently prescribed opioids?: no  Dietary Habits and Nutritional Risks How many meals a day?: (!) 1 Eats fruit and vegetables daily?: (!) no Most meals are obtained by: preparing own meals In the last 2 weeks, have you had any of the following?: (!) nausea, vomiting, diarrhea Diabetic:: (!) yes Any non-healing wounds?: no How often do you check your BS?: 2 Would you like to be referred to a Nutritionist or for Diabetic Management? : no  Functional Status Activities of Daily Living (to include ambulation/medication): Independent Ambulation: Independent Medication Administration: Independent Home Management (perform basic housework or laundry): Independent Manage your own finances?: yes Primary transportation is: family / friends Concerns  about vision?: no *vision screening is required for WTM* Concerns about hearing?: (!) yes (sometimes) Uses hearing aids?: no  Fall Screening Falls in the past year?: 1 (tripped fell in flower pot) Number of falls in past year: 0 Was there an injury with Fall?: 0 Fall Risk Category Calculator: 1 Patient Fall Risk Level: Low Fall Risk  Fall Risk Patient at Risk for Falls Due to: Medication side effect Fall risk Follow up: Falls prevention discussed; Falls evaluation completed  Home and Transportation Safety: All rugs have non-skid backing?: N/A, no rugs All stairs or steps have railings?: yes Grab bars in the bathtub or shower?: yes Have non-skid surface in bathtub or shower?: yes Good home lighting?: yes Regular seat belt use?: yes Hospital stays in the last year:: (!) yes How many hospital stays:: 2 Reason: a fib  Cognitive Assessment Difficulty concentrating, remembering, or making decisions? : yes Will 6CIT or Mini Cog be Completed: yes What year is it?: 0 points What month is it?: 0 points Give patient an address phrase to remember (5 components): [address] About what time is it?: 0 points Count backwards from 20 to 1: 0 points Say the months of the year in reverse: 4 points Repeat the address phrase from earlier: 2 points 6 CIT Score: 6 points  Advance Directives (For Healthcare) Does Patient Have a Medical Advance Directive?: No Type of Advance Directive: Healthcare Power of Attorney Would patient like information on creating a medical advance directive?: No - Patient declined  Reviewed/Updated  Reviewed/Updated: Reviewed All (Medical, Surgical, Family, Medications, Allergies, Care Teams, Patient Goals)    Allergies (verified) Aspirin, Lexapro  [escitalopram ], Nsaids, Ozempic  (0.25 or 0.5 mg-dose) [semaglutide (0.25 or 0.5mg -dos)], Quinolones, Robinul  [glycopyrrolate], Morphine, Penicillin v potassium, and Sulfa antibiotics   Current Medications  (verified) Outpatient  Encounter Medications as of 03/26/2024  Medication Sig   acetaminophen  (TYLENOL ) 500 MG tablet Take 1,000 mg by mouth every 8 (eight) hours as needed for mild pain (pain score 1-3), moderate pain (pain score 4-6) or headache.   ALPRAZolam  (XANAX ) 0.5 MG tablet Take 1 tablet (0.5 mg total) by mouth at bedtime as needed.   amiodarone  (PACERONE ) 200 MG tablet Take 1 tablet (200 mg total) by mouth daily.   apixaban  (ELIQUIS ) 5 MG TABS tablet Take 1 tablet (5 mg total) by mouth 2 (two) times daily.   busPIRone  (BUSPAR ) 10 MG tablet Take 1 tablet (10 mg total) by mouth 2 (two) times daily.   Calcium  Carb-Cholecalciferol  (CALCIUM  600/VITAMIN D3) 600-20 MG-MCG TABS Take 1 tablet by mouth daily.   Cholecalciferol  (VITAMIN D3) 50 MCG (2000 UT) capsule Take 2,000 Units by mouth daily.   clobetasol  ointment (TEMOVATE ) 0.05 % Apply twice daily to affected areas on hands until improved then as needed for flares.   digoxin  (LANOXIN ) 0.25 MG tablet Take 1 tablet (250 mcg total) by mouth daily.   docusate sodium  (STOOL SOFTENER) 100 MG capsule Take 1 capsule (100 mg total) by mouth 2 (two) times daily as needed.   empagliflozin  (JARDIANCE ) 25 MG TABS tablet Take 1 tablet (25 mg total) by mouth daily before breakfast.   ezetimibe  (ZETIA ) 10 MG tablet Take 1 tablet (10 mg total) by mouth daily.   Flaxseed, Linseed, (FLAXSEED OIL) 1400 MG CAPS Take 1,400 mg by mouth 2 (two) times daily.   furosemide  (LASIX ) 20 MG tablet Take 1 tablet (20 mg total) by mouth daily.   hydrocortisone  (ANUSOL -HC) 25 MG suppository Place 1 suppository (25 mg total) rectally 2 (two) times daily. (Patient taking differently: Place 25 mg rectally 2 (two) times daily as needed for hemorrhoids.)   linaclotide  (LINZESS ) 290 MCG CAPS capsule Take 1 capsule (290 mcg total) by mouth daily before breakfast.   losartan  (COZAAR ) 25 MG tablet Take 1 tablet (25 mg total) by mouth daily.   magnesium  oxide (MAG-OX) 400 MG tablet  Take 1 tablet (400 mg total) by mouth daily.   metFORMIN  (GLUCOPHAGE -XR) 500 MG 24 hr tablet Take 1 tablet (500 mg total) by mouth 2 (two) times daily with food.   metoprolol  tartrate (LOPRESSOR ) 50 MG tablet Take 1 tablet (50 mg total) by mouth 2 (two) times daily.   Multiple Vitamins-Minerals (MULTIVITAMIN ADULT PO) Take 1 tablet by mouth daily. One a day   ONETOUCH ULTRA test strip USE AS INSTRUCTED   pantoprazole  (PROTONIX ) 40 MG tablet Take 1 tablet (40 mg total) by mouth 2 (two) times daily.   potassium chloride  SA (KLOR-CON  M) 20 MEQ tablet Take 1 tablet (20 mEq total) by mouth daily.   Semaglutide  (RYBELSUS ) 14 MG TABS Take 1 tablet (14 mg total) by mouth daily. Do not cut, crush, or chew.   Semaglutide  (RYBELSUS ) 14 MG TABS Take 1 tablet (14 mg total) by mouth daily. Do not cut, crush, or chew.   venlafaxine  XR (EFFEXOR  XR) 75 MG 24 hr capsule Take 1 capsule (75 mg total) by mouth daily with breakfast.   venlafaxine  XR (EFFEXOR -XR) 150 MG 24 hr capsule Take 1 capsule (150 mg total) by mouth daily with breakfast.   Continuous Glucose Sensor (FREESTYLE LIBRE 3 PLUS SENSOR) MISC Change sensor every 15 days. (Patient not taking: Reported on 03/26/2024)   No facility-administered encounter medications on file as of 03/26/2024.    History: Past Medical History:  Diagnosis Date   Anxiety  Atrial fibrillation (HCC)    Depression    Depression    Phreesia 07/02/2020   Diabetes mellitus without complication (HCC)    GERD (gastroesophageal reflux disease)    Hypertension    IBS (irritable bowel syndrome)    Neuromuscular disorder (HCC)    Past Surgical History:  Procedure Laterality Date   ATRIAL FIBRILLATION ABLATION N/A 05/02/2023   Procedure: ATRIAL FIBRILLATION ABLATION;  Surgeon: Cindie Ole DASEN, MD;  Location: [facility];  Service: Cardiovascular;  Laterality: N/A;   BREAST EXCISIONAL BIOPSY     CARDIOVERSION N/A 09/03/2022   Procedure: CARDIOVERSION;  Surgeon:  Darron Deatrice LABOR, MD;  Location: [facility];  Service: Cardiovascular;  Laterality: N/A;   CARDIOVERSION N/A 06/03/2023   Procedure: CARDIOVERSION;  Surgeon: Darliss Rogue, MD;  Location: [facility];  Service: Cardiovascular;  Laterality: N/A;   CARDIOVERSION N/A 10/31/2023   Procedure: CARDIOVERSION;  Surgeon: Lonni Slain, MD;  Location: [facility] IN[facility] CV LAB;  Service: Cardiovascular;  Laterality: N/A;   CARDIOVERSION N/A 11/15/2023   Procedure: CARDIOVERSION;  Surgeon: Francyne Headland, MD;  Location: [facility];  Service: Cardiovascular;  Laterality: N/A;   CARDIOVERSION N/A 12/26/2023   Procedure: CARDIOVERSION;  Surgeon: Santo Stanly LABOR, MD;  Location: [facility];  Service: Cardiovascular;  Laterality: N/A;   CATARACT EXTRACTION W/PHACO Right 07/24/2022   Procedure: CATARACT EXTRACTION PHACO AND INTRAOCULAR LENS PLACEMENT (IOC) RIGHT DIABETIC  5.93  00:42.5;  Surgeon: Jaye Fallow, MD;  Location[facility];  Service: Ophthalmology;  Laterality: Right;   CATARACT EXTRACTION W/PHACO Left 08/07/2022   Procedure: CATARACT EXTRACTION PHACO AND INTRAOCULAR LENS PLACEMENT (IOC) LEFT DIABETIC  12.33  00:58.3;  Surgeon: Jaye Fallow, MD;  Location: [facility];  Service: Ophthalmology;  Laterality: Left;   CESAREAN SECTION N/A    Phreesia 07/02/2020   CHOLECYSTECTOMY     EXCISION / BIOPSY BREAST / NIPPLE / DUCT Right 1973   duct removed   SPINE SURGERY N/A    Phreesia 07/02/2020   TUBAL LIGATION N/A    Phreesia 07/02/2020   Family History  Problem Relation Age of Onset   Diabetes Mother    Hypertension Mother    Coronary artery disease Father    Glaucoma Father    Breast cancer Neg Hx    Social History   Occupational History   Not on file  Tobacco Use   Smoking status: Former    Passive exposure: Past   Smokeless tobacco: Never   Tobacco comments:    Former smoker 10/29/23  Vaping Use   Vaping status: Never Used  Substance  and Sexual Activity   Alcohol use: No    Alcohol/week: 0.0 standard drinks of alcohol   Drug use: Never   Sexual activity: Not Currently    Partners: Male   Tobacco Counseling Counseling given: Not Answered Tobacco comments: Former smoker 10/29/23  SDOH Screenings   Food Insecurity: No Food Insecurity (03/26/2024)  Housing: Unknown (03/26/2024)  Transportation Needs: No Transportation Needs (03/26/2024)  Utilities: Not At Risk (03/26/2024)  Alcohol Screen: Low Risk (03/26/2024)  Depression (PHQ2-9): Medium Risk (03/26/2024)  Financial Resource Strain: Low Risk (03/26/2024)  Physical Activity: Inactive (03/26/2024)  Social Connections: Moderately Isolated (03/26/2024)  Stress: Stress Concern Present (03/26/2024)  Tobacco Use: Medium Risk (03/26/2024)  Health Literacy: Adequate Health Literacy (03/26/2024)   See flowsheets for full screening details  Depression Screen PHQ 2 & 9 Depression Scale- Over the past 2 weeks, how often have you been bothered by  any of the following problems? Little interest or pleasure in doing things: 2 Feeling down, depressed, or hopeless (PHQ Adolescent also includes...irritable): 2 PHQ-2 Total Score: 4 Trouble falling or staying asleep, or sleeping too much: 2 Feeling tired or having little energy: 3 Poor appetite or overeating (PHQ Adolescent also includes...weight loss): 0 Feeling bad about yourself - or that you are a failure or have let yourself or your family down: 0 Trouble concentrating on things, such as reading the newspaper or watching television (PHQ Adolescent also includes...like school work): 0 Moving or speaking so slowly that other people could have noticed. Or the opposite - being so fidgety or restless that you have been moving around a lot more than usual: 0 Thoughts that you would be better off dead, or of hurting yourself in some way: 0 PHQ-9 Total Score: 9 If you checked off any problems, how difficult have these problems made  it for you to do your work, take care of things at home, or get along with other people?: Somewhat difficult  Depression Treatment Depression Interventions/Treatment : Currently on Treatment     Goals Addressed             This Visit's Progress    Patient Stated       03/26/2024, wants to get back to self             Objective:    Today's Vitals   There is no height or weight on file to calculate BMI.  Hearing/Vision screen Hearing Screening - Comments:: Sometimes trouble hearing Vision Screening - Comments:: Regular eye exams, [facility] Immunizations and Health Maintenance Health Maintenance  Topic Date Due   Lung Cancer Screening  Never done   Diabetic kidney evaluation - Urine ACR  09/25/2023   Mammogram  09/29/2023   COVID-19 Vaccine (4 - 2025-26 season) 12/16/2023   FOOT EXAM  12/27/2023   Colonoscopy  02/12/2024   HEMOGLOBIN A1C  05/08/2024   Diabetic kidney evaluation - eGFR measurement  01/01/2025   Medicare Annual Wellness (AWV)  03/26/2025   DTaP/Tdap/Td (2 - Td or Tdap) 01/01/2034   Pneumococcal Vaccine: 50+ Years  Completed   Influenza Vaccine  Completed   Bone Density Scan  Completed   Hepatitis C Screening  Completed   Zoster Vaccines- Shingrix  Completed   Meningococcal B Vaccine  Aged Out        Assessment/Plan:  This is a routine wellness examination for Lisa Gutierrez.  Patient Care Team: [name] as PCP - General (Physician Assistant) Darron Deatrice LABOR, MD as PCP - Cardiology (Cardiology) Cindie Ole DASEN, MD as PCP - Electrophysiology (Cardiology) Melanee Annah BROCKS, MD as Consulting Physician (Oncology) Gerard Frederick, NP as Nurse Practitioner (Cardiology) Pandora Cadet, [facility] as Pharmacist (Pharmacist) Jaye Fallow, MD as Referring Physician (Ophthalmology)  I have personally reviewed and noted the following in the patients chart:   Medical and social history Use of alcohol, tobacco or illicit drugs  Current  medications and supplements including opioid prescriptions. Functional ability and status Nutritional status Physical activity Advanced directives List of other physicians Hospitalizations, surgeries, and ER visits in previous 12 months Vitals Screenings to include cognitive, depression, and falls Referrals and appointments  No orders of the defined types were placed in this encounter.  In addition, I have reviewed and discussed with patient certain preventive protocols, quality metrics, and best practice recommendations. A written personalized care plan for preventive services as well as general preventive health recommendations were  provided to patient.   Ardella FORBES Dawn, LPN   [id number]/[id number]   Return in 1 year (on 03/26/2025).  After Visit Summary: (MyChart) Due to this being a telephonic visit, the after visit summary with patients personalized plan was offered to patient via MyChart   Nurse Notes: Has appointment with gastroenterology in January. Patient will schedule mammogram. Declines covid vaccine. Endocrinologist checks feet.

## 2024-03-30 ENCOUNTER — Telehealth (HOSPITAL_COMMUNITY): Payer: Self-pay | Admitting: Psychiatry

## 2024-03-30 ENCOUNTER — Ambulatory Visit (INDEPENDENT_AMBULATORY_CARE_PROVIDER_SITE_OTHER): Admitting: Licensed Clinical Social Worker

## 2024-03-30 ENCOUNTER — Other Ambulatory Visit (HOSPITAL_COMMUNITY): Payer: Self-pay

## 2024-03-30 ENCOUNTER — Other Ambulatory Visit: Payer: Self-pay

## 2024-03-30 DIAGNOSIS — F4310 Post-traumatic stress disorder, unspecified: Secondary | ICD-10-CM

## 2024-03-30 DIAGNOSIS — F411 Generalized anxiety disorder: Secondary | ICD-10-CM | POA: Diagnosis not present

## 2024-03-30 DIAGNOSIS — F331 Major depressive disorder, recurrent, moderate: Secondary | ICD-10-CM

## 2024-03-30 NOTE — Progress Notes (Signed)
 THERAPIST PROGRESS NOTE  Virtual Visit via Video Note  I connected with Lisa Gutierrez on 03/30/2024 at 10:02am by a video enabled telemedicine application and verified that I am speaking with the correct person using two identifiers.  Location: Patient: Address on file  Provider: Providers Address   I discussed the limitations of evaluation and management by telemedicine and the availability of in person appointments. The patient expressed understanding and agreed to proceed.   I discussed the assessment and treatment plan with the patient. The patient was provided an opportunity to ask questions and all were answered. The patient agreed with the plan and demonstrated an understanding of the instructions.   The patient was advised to call back or seek an in-person evaluation if the symptoms worsen or if the condition fails to improve as anticipated.  I provided 52 minutes of non-face-to-face time during this encounter.   Evalene KATHEE Husband, LCSW   Session Time: 10:02am-10:54am  Participation Level: Active  Behavioral Response: CasualAlertDepressed  Type of Therapy: Individual Therapy  Treatment Goals addressed:  Active     OP Depression     LTG: Reduce frequency, intensity, and duration of depression symptoms so that daily functioning is improved     Start:  02/24/24    Expected End:  05/26/24         LTG: Increase coping skills to manage depression and improve ability to perform daily activities     Start:  02/24/24    Expected End:  05/26/24         STG: Improve independence: I would like to go back to church.     Start:  02/24/24    Expected End:  05/26/24         STG - Other Self-Esteem Goal (Specify)     Start:  02/24/24    Expected End:  05/26/24      Improve self esteem       Work with Jon to identify the major components of a recent episode of depression: physical symptoms, major thoughts and images, and major behaviors they  experienced     Start:  02/24/24         Therapist will educate patient on cognitive distortions and the rationale for treatment of depression     Start:  02/24/24         Coping Skills      Start:  02/24/24       Will work with the pt using CBT/DBT techniques to help the pt verbalize an understanding of the cognitive, physiological, and behavioral components of depression and its treatment. This will be done by using worksheets, interactive activities, CBT/ABC thought logs, modeling, homework, role playing and journaling. Will work with pt to learn and implement coping skills that result in a reduction of depression and improve daily functioning per pt self-report 3 out of 5 documented sessions.          ProgressTowards Goals: Initial  Interventions: CBT, Supportive, and Reframing  Summary: Lisa Gutierrez is a 73 y.o. female who presents with Change in energy/activity; Weight gain/loss; Worthlessness; Difficulty Concentrating; Fatigue; Hopelessness; Increase/decrease in appetite; Irritability; Sleep (too much or little); Tearfulness; Worrying; Sleep; Restlessness; Fatigue; Difficulty concentrating;  Avoids reminders of event; Detachment from others; Emotional numbing; Guilt/shame; Re-experience of traumatic event; Difficulty staying/falling asleep; Irritability/anger. Pt was oriented times 5. Pt was cooperative and engaged. Pt denies SI/HI/AVH.    The patient reports that she feels her medications are not helpful. She states  that she dreams all night and goes to sleep feeling depressed, waking up with the same depressed feelings. She mentions that she does not leave her home due to dizziness caused by her medications. She feels isolated because she believes she does not have healthy friendships. The patient often catches herself overthinking social interactions, which leads to deeper depressive episodes.  She expresses feelings of failure in her role as a mother due to recent  interactions with her relatives. Although she has made efforts to communicate her feelings with her family, she feels disrespected. The patient describes herself as normally being an outgoing individual, but she no longer enjoys social engagements.  We worked together to challenge her belief that she is a failure, reflecting on her relationships with her mother and grandmother.  Introduced patient to IOP and a referral was sent on behalf of the patient.    Suicidal/Homicidal: Nowithout intent/plan  Therapist Response: Clinician utilized active and supportive reflection to create a safe space for patient to process recent life events. Assessed for safety, stressors, symptoms since last session. Worked with the patient through CBT to challenge the belief she is a failure. The clinician observed that the patient engages in mind-reading and uses emotions as facts when rationalizing social interactions.The clinician made numerous efforts to redirect the patient to focus on factors she can control. The patient demonstrated difficulties in understanding that she cannot control the behaviors of others and often personalizes their actions.  In future sessions, the clinician will educate the patient about cognitive distortions.   Plan: Return again in 2 weeks.  Diagnosis: PTSD (post-traumatic stress disorder)  MDD (major depressive disorder), recurrent episode, moderate (HCC)  GAD (generalized anxiety disorder)   Collaboration of Care: AEB psychiatrist can access notes and cln. Will review psychiatrists' notes. Check in with the patient and will see LCSW per availability. Patient agreed with treatment recommendations.   Patient/Guardian was advised Release of Information must be obtained prior to any record release in order to collaborate their care with an outside provider. Patient/Guardian was advised if they have not already done so to contact the registration department to sign all necessary forms  in order for us  to release information regarding their care.   Consent: Patient/Guardian gives verbal consent for treatment and assignment of benefits for services provided during this visit. Patient/Guardian expressed understanding and agreed to proceed.   Evalene KATHEE Husband, LCSW 03/30/2024

## 2024-03-30 NOTE — Telephone Encounter (Signed)
 D:  Evalene Husband, LCSW referred pt to virtual MH-IOP.  A:  The MH-IOP Case Mgr reached out to the pt but there was no answer.  Left vm requesting a call back.  Informed Kristina.

## 2024-04-01 ENCOUNTER — Other Ambulatory Visit: Payer: Self-pay

## 2024-04-01 ENCOUNTER — Other Ambulatory Visit (HOSPITAL_COMMUNITY): Payer: Self-pay

## 2024-04-01 DIAGNOSIS — F411 Generalized anxiety disorder: Secondary | ICD-10-CM

## 2024-04-01 DIAGNOSIS — F331 Major depressive disorder, recurrent, moderate: Secondary | ICD-10-CM

## 2024-04-01 MED ORDER — VENLAFAXINE HCL ER 150 MG PO CP24
150.0000 mg | ORAL_CAPSULE | Freq: Every day | ORAL | 0 refills | Status: AC
  Filled 2024-04-01: qty 30, 30d supply, fill #0
  Filled 2024-05-06 – 2024-05-07 (×2): qty 30, 30d supply, fill #1

## 2024-04-03 ENCOUNTER — Other Ambulatory Visit: Payer: Self-pay

## 2024-04-06 ENCOUNTER — Ambulatory Visit: Attending: Cardiology | Admitting: Cardiology

## 2024-04-06 ENCOUNTER — Other Ambulatory Visit: Payer: Self-pay

## 2024-04-06 VITALS — BP 112/60 | HR 83 | Ht 66.0 in | Wt 204.2 lb

## 2024-04-06 DIAGNOSIS — I10 Essential (primary) hypertension: Secondary | ICD-10-CM

## 2024-04-06 DIAGNOSIS — Z79899 Other long term (current) drug therapy: Secondary | ICD-10-CM | POA: Diagnosis not present

## 2024-04-06 DIAGNOSIS — I4819 Other persistent atrial fibrillation: Secondary | ICD-10-CM

## 2024-04-06 MED ORDER — AMIODARONE HCL 100 MG PO TABS
100.0000 mg | ORAL_TABLET | Freq: Every day | ORAL | 3 refills | Status: DC
  Filled 2024-05-06: qty 30, 30d supply, fill #0

## 2024-04-06 NOTE — Patient Instructions (Signed)
 Medication Instructions:  Your physician recommends the following medication changes.  DECREASE: Amiodarone  100 mg daily  *If you need a refill on your cardiac medications before your next appointment, please call your pharmacy*  Lab Work: Your provider would like for you to have following labs drawn today CMP, free T4, and TSH.   If you have labs (blood work) drawn today and your tests are completely normal, you will receive your results only by: MyChart Message (if you have MyChart) OR A paper copy in the mail If you have any lab test that is abnormal or we need to change your treatment, we will call you to review the results.  Follow-Up: At [facility], you and your health needs are our priority.  As part of our continuing mission to provide you with exceptional heart care, our providers are all part of one team.  This team includes your primary Cardiologist (physician) and Advanced Practice Providers or APPs (Physician Assistants and Nurse Practitioners) who all work together to provide you with the care you need, when you need it.  Your next appointment:   6 month(s)  Provider:   You may see SIDRA KITTY, MD or one of the following Advanced Practice Providers on your designated Care Team:   Charlies Arthur, [place] Ozell Jodie Passey, PA-C Suzann Riddle, NP Daphne Barrack, NP Artist Pouch, PA-C

## 2024-04-06 NOTE — Progress Notes (Signed)
" °  Electrophysiology Office Follow up Visit Note:    Date:  04/06/2024   ID:  Lisa Gutierrez, DOB 1950/09/02, MRN [id number]  PCP:  Gayle Saddie FALCON, PA-C  [facility] Cardiologist:  Deatrice Cage, MD  [facility] Electrophysiologist:  [name], MD    Interval History:     Lisa Gutierrez is a 73 y.o. female who presents for a follow up visit.   The patient last saw [facility] December 20, 2023.  She has a history of atrial fibrillation, diabetes and hypertension.  She had an A-fib ablation May 02, 2023.  She had to have a cardioversion following the procedure.  She was loaded on amiodarone .  A cardioversion was scheduled at the appoint with Daril in September.  She takes Eliquis  for stroke prophylaxis. Her cardioversion took place December 26, 2023. She saw Joen in clinic January 28, 2024.  At that appointment she was in sinus rhythm.  She is doing well today.  She does report some gait instability.  She also reports some agitation that she thinks may be caused by her venlafaxine  prescription.  Her psychiatrist prescribes that and her buspirone .      Past medical, surgical, social and family history were reviewed.  ROS:   Please see the history of present illness.    All other systems reviewed and are negative.  EKGs/Labs/Other Studies Reviewed:    The following studies were reviewed today:     EKG Interpretation Date/Time:  Monday April 06 2024 12:49:35 EST Ventricular Rate:  83 PR Interval:  192 QRS Duration:  94 QT Interval:  382 QTC Calculation: 448 R Axis:   -41  Text Interpretation: Normal sinus rhythm Confirmed by Holts Ole [phone]) on 04/06/2024 1:03:04 PM    Physical Exam:    VS:  BP 112/60 (BP Location: Left Arm, Patient Position: Sitting, Cuff Size: Large)   Pulse 83   Ht 5' 6 (1.676 m)   Wt 204 lb 4 oz (92.6 kg)   SpO2 97%   BMI 32.97 kg/m     Wt Readings from Last 3 Encounters:  04/06/24 204 lb 4 oz (92.6  kg)  03/05/24 203 lb (92.1 kg)  01/28/24 205 lb 6.4 oz (93.2 kg)     GEN: no distress CARD: RRR, No MRG RESP: No IWOB. CTAB.      ASSESSMENT:    1. Persistent atrial fibrillation (HCC)   2. Essential hypertension    PLAN:    In order of problems listed above:  #Persistent atrial fibrillation #High risk medication monitoring-amiodarone  Prior catheter ablation.  Failed dofetilide .  Now on amiodarone .  Maintaining sinus rhythm after September cardioversion. Continue Eliquis  for stroke prophylaxis Update CMP, TSH and free T4 Decrease amiodarone  to 100 mg by mouth daily  #Hypertension At goal today.  Recommend checking blood pressures 1-2 times per week at home and recording the values.  Recommend bringing these recordings to the primary care physician.  I discussed my upcoming departure from [name] during today's clinic appointment.  The patient will continue to follow-up with one of my EP partners moving forward.  Follow-up with EP APP in 6 months    Signed, Ole Holts, MD, [facility], [facility] 04/06/2024 1:09 PM    Electrophysiology [facility] "

## 2024-04-07 ENCOUNTER — Other Ambulatory Visit (HOSPITAL_COMMUNITY): Payer: Self-pay

## 2024-04-07 ENCOUNTER — Encounter: Payer: Self-pay | Admitting: Cardiology

## 2024-04-07 NOTE — Telephone Encounter (Signed)
 Contacted pt to see if she wanted to come in per the provider and she said she is not able to because she has a doctor appt today, she asked about having them done at [facility] and I told her I was not certain that they did those labs but she could check and I could send it to them if she gets their fax number, told her to call back with this information before 5 pm

## 2024-04-11 LAB — COMPREHENSIVE METABOLIC PANEL WITH GFR
ALT: 31 IU/L (ref 0–32)
AST: 30 IU/L (ref 0–40)
Albumin: 4.3 g/dL (ref 3.8–4.8)
Alkaline Phosphatase: 67 IU/L (ref 49–135)
BUN/Creatinine Ratio: 19 (ref 12–28)
BUN: 13 mg/dL (ref 8–27)
Bilirubin Total: 0.3 mg/dL (ref 0.0–1.2)
CO2: 21 mmol/L (ref 20–29)
Calcium: 10.3 mg/dL (ref 8.7–10.3)
Chloride: 100 mmol/L (ref 96–106)
Creatinine, Ser: 0.68 mg/dL (ref 0.57–1.00)
Globulin, Total: 2.7 g/dL (ref 1.5–4.5)
Glucose: 180 mg/dL — ABNORMAL HIGH (ref 70–99)
Potassium: 4.5 mmol/L (ref 3.5–5.2)
Sodium: 136 mmol/L (ref 134–144)
Total Protein: 7 g/dL (ref 6.0–8.5)
eGFR: 92 mL/min/1.73

## 2024-04-11 LAB — T4, FREE: Free T4: 1.47 ng/dL (ref 0.82–1.77)

## 2024-04-11 LAB — TSH: TSH: 0.419 u[IU]/mL — ABNORMAL LOW (ref 0.450–4.500)

## 2024-04-12 NOTE — Therapy (Deleted)
 " OUTPATIENT OCCUPATIONAL THERAPY ORTHO EVALUATION  Patient Name: Lisa Gutierrez MRN: [id number] DOB:[birth year], 73 y.o., female Today's Date: 04/12/2024  PCP: Saddie Sacks - PA REFERRING PROVIDER: Dr Ezra  END OF SESSION:   Past Medical History:  Diagnosis Date   Anxiety    Atrial fibrillation [facility])    Depression    Depression    Phreesia 07/02/2020   Diabetes mellitus without complication (HCC)    GERD (gastroesophageal reflux disease)    Hypertension    IBS (irritable bowel syndrome)    Neuromuscular disorder (HCC)    Past Surgical History:  Procedure Laterality Date   ATRIAL FIBRILLATION ABLATION N/A 05/02/2023   Procedure: ATRIAL FIBRILLATION ABLATION;  Surgeon: Cindie Ole DASEN, MD;  Location: [facility] INV[facility];  Service: Cardiovascular;  Laterality: N/A;   BREAST EXCISIONAL BIOPSY     CARDIOVERSION N/A 09/03/2022   Procedure: CARDIOVERSION;  Surgeon: Darron Deatrice LABOR, MD;  Location: [facility] ORS;  Service: Cardiovascular;  Laterality: N/A;   CARDIOVERSION N/A 06/03/2023   Procedure: CARDIOVERSION;  Surgeon: Darliss Rogue, MD;  Location: [facility] ORS;  Service: Cardiovascular;  Laterality: N/A;   CARDIOVERSION N/A 10/31/2023   Procedure: CARDIOVERSION;  Surgeon: Lonni Slain, MD;  Location: [facility] INVASIVE CV LAB;  Service: Cardiovascular;  Laterality: N/A;   CARDIOVERSION N/A 11/15/2023   Procedure: CARDIOVERSION;  Surgeon: Francyne Headland, MD;  Location: [facility] INVASIVE CV LAB;  Service: Cardiovascular;  Laterality: N/A;   CARDIOVERSION N/A 12/26/2023   Procedure: CARDIOVERSION;  Surgeon: Santo Stanly LABOR, MD;  Location: [facility] INVASIVE CV LAB;  Service: Cardiovascular;  Laterality: N/A;   CATARACT EXTRACTION W/PHACO Right 07/24/2022   Procedure: CATARACT EXTRACTION PHACO AND INTRAOCULAR LENS PLACEMENT (IOC) RIGHT DIABETIC  5.93  00:42.5;  Surgeon: Jaye Fallow, MD;  Location: [facility];  Service: Ophthalmology;  Laterality: Right;   CATARACT  EXTRACTION W/PHACO Left 08/07/2022   Procedure: CATARACT EXTRACTION PHACO AND INTRAOCULAR LENS PLACEMENT (IOC) LEFT DIABETIC  12.33  00:58.3;  Surgeon: Jaye Fallow, MD;  Location: [facility];  Service: Ophthalmology;  Laterality: Left;   CESAREAN SECTION N/A    Phreesia 07/02/2020   CHOLECYSTECTOMY     EXCISION / BIOPSY BREAST / NIPPLE / DUCT Right 1973   duct removed   SPINE SURGERY N/A    Phreesia 07/02/2020   TUBAL LIGATION N/A    Phreesia 07/02/2020   Patient Active Problem List   Diagnosis Date Noted   Hypokalemia 01/02/2024   Lactic acid increased 11/05/2023   SIRS (systemic inflammatory response syndrome) (HCC) 11/05/2023   Morbid obesity (HCC) 11/05/2023   Persistent atrial fibrillation (HCC) 06/03/2023   Sepsis without acute organ dysfunction (HCC) 05/09/2023   Hyperlipidemia associated with type 2 diabetes mellitus (HCC) 12/27/2022   Early satiety 12/27/2022   Atrial fibrillation with RVR (HCC) 07/10/2022   Prurigo nodularis 01/01/2022   Chronic pain of left knee 01/03/2021   Cyst of right ovary 12/09/2020   Class 2 severe obesity due to excess calories with serious comorbidity and body mass index (BMI) of 38.0 to 38.9 in adult 10/03/2020   Statin intolerance 03/06/2020   Vertigo 05/22/2019   Chronic allergic rhinitis 03/18/2019   Marital relationship problem 01/30/2019   MDD (major depressive disorder), recurrent, in partial remission 01/30/2019   Primary insomnia 02/06/2018   Hypertension associated with type 2 diabetes mellitus (HCC) 12/06/2017   GAD (generalized anxiety disorder) 12/06/2017   Gastroesophageal reflux disease without esophagitis 12/06/2017   Type 2 diabetes mellitus with hyperglycemia, without long-term current use  of insulin  (HCC) 12/06/2017   Fatty liver disease, nonalcoholic 08/29/2017   Chronic superficial gastritis without bleeding 11/23/2016   Irritable bowel syndrome with constipation 11/06/2016   External hemorrhoids  11/06/2016    ONSET DATE: Oct 25  REFERRING DIAG: L De Quervain tenosynovitis   THERAPY DIAG:  No diagnosis found.  Rationale for Evaluation and Treatment: Rehabilitation  SUBJECTIVE:   SUBJECTIVE STATEMENT: *** Pt accompanied by: self  PERTINENT HISTORY: 03/16/24 Dr Ezra visit Left wrist cyst  History of Present Illness: History of Present Illness Mercy Mehrer is a 73 year old female who presents with left thumb pain and hypersensitivity. She has been experiencing left thumb and wrist pain since being discharged from the hospital approximately two months ago. The pain began about a week after her hospital stay, where she received an intravenous line. The pain is severe and sensitive, radiating from the thumb to the wrist. She reports difficulty with movement such as bending the hand back or gripping objects.  She previously received treatment from Dr. Marene, who aspirated a cyst and injected a steroid into the area, providing relief for only three days. She has tried using a brace, which was uncomfortable and ineffective, and applied Voltaren gel without significant relief. She occasionally takes Tylenol  for pain management but cannot tolerate anti-inflammatory medications due to stomach upset.  The pain significantly impacts her daily activities, making it difficult to hold objects or bend her hand back. She experiences a catching sensation when attempting to move her thumb, and the pain is described as a numbing type along a specific line on her hand.  Her medical history includes atrial fibrillation, which prevents her from driving, and diabetes, with a recent A1c of 7.8. She is concerned about the potential impact of treatments on her blood sugar level   Left Upper Extremity:  Pain to palpation over the first dorsal extensor compartment. Pain palpation at the first [facility] joint. Positive Eichhoff's, equivocal Finkelstein's, pain with resisted thumb extension. Mildly positive  thumb CMC grind test and thumb adduction test. No tenderness to the A1 pulley of the thumb. No active triggering of the thumb. Patient demonstrates allodynia with light touch over the dorsal radial aspect of the thumb and wrist causing significant pain. Sensation intact to light touch but subjectively limited over the SBRN distribution. WWP.  Imaging and Results: Radiographs of the left hand obtained on 03/06/2024 were personally interpreted. There is evidence of Eaton stage II first CMC arthritis with joint space narrowing at the first [facility] joint, osteophyte formation, mild metacarpal subsidence.  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 Discussed potential for steroid injection to alleviate symptoms, though not guaranteed to resolve all issues. Emphasized importance of therapy to address nerve hypersensitivity, though transportation challenges limit access. - Administered steroid injection into the tendon sheath. - Recommended OT to address nerve hypersensitivity, with emphasis on at least one session to learn home exercises. - Advised home exercises to desensitize the nerve, including gentle touching and rubbing of the affected area.  Left wrist/thumb ganglion cyst Presence of a ganglion cyst at the left wrist/thumb area, previously aspirated and injected with steroid. Persistent pain suggests possible contribution to current symptoms, though not the primary issue.  Left thumb basal joint osteoarthritis Mild osteoarthritis at the base of the left thumb, contributing to pain but not the primary source of symptoms.   PRECAUTIONS: None    WEIGHT BEARING RESTRICTIONS: No  PAIN:  Are you having pain? {OPRCPAIN:27236}  FALLS: Has patient fallen in last 6 months? No  LIVING ENVIRONMENT: Lives with: {OPRC lives with:25569::lives with their family}   PLOF: {PLOF:24004}  PATIENT GOALS:  ***  NEXT MD VISIT: ***  OBJECTIVE:  Note: Objective measures were completed at Evaluation unless otherwise noted.  HAND DOMINANCE: Right  ADLs: {ADLs OT:31716}  FUNCTIONAL OUTCOME MEASURES:  PRWHE pain and function  UPPER EXTREMITY ROM:     Active ROM Right eval Left eval  Shoulder flexion    Shoulder abduction    Shoulder adduction    Shoulder extension    Shoulder internal rotation    Shoulder external rotation    Elbow flexion    Elbow extension    Wrist flexion    Wrist extension    Wrist ulnar deviation    Wrist radial deviation    Wrist pronation    Wrist supination    (Blank rows = not tested)  Active ROM Right eval Left eval  Thumb MCP (0-60)    Thumb IP (0-80)    Thumb Radial abd/add (0-55)     Thumb Palmar abd/add (0-45)     Thumb Opposition to Small Finger     Index MCP (0-90)     Index PIP (0-100)     Index DIP (0-70)      Long MCP (0-90)      Long PIP (0-100)      Long DIP (0-70)      Ring MCP (0-90)      Ring PIP (0-100)      Ring DIP (0-70)      Little MCP (0-90)      Little PIP (0-100)      Little DIP (0-70)      (Blank rows = not tested)   HAND FUNCTION: Grip strength: Right: *** lbs; Left: *** lbs, Lateral pinch: Right: *** lbs, Left: *** lbs, and 3 point pinch: Right: *** lbs, Left: *** lbs  COORDINATION: {otcoordination:27237}  SENSATION: {sensation:27233}  EDEMA: ***  COGNITION: Overall cognitive status: Within functional limits for tasks assessed      TREATMENT DATE: 04/13/24                                                                                                                            Modalities: {OPRCMODALITIES:31717}     PATIENT EDUCATION: Education details: findings of eval and HEP  Person educated: Patient Education method: Explanation, Demonstration, Tactile cues, Verbal cues, and Handouts Education comprehension: verbalized understanding, returned demonstration, verbal cues required, and  needs further education   GOALS: Goals reviewed with patient? Yes  SHORT TERM GOALS: Target date: ***  *** Baseline: Goal status: INITIAL  2.  *** Baseline:  Goal status: INITIAL  3.  *** Baseline:  Goal status: INITIAL  LONG TERM GOALS: Target date: ***  *** Baseline:  Goal status: INITIAL  2.  *** Baseline:  Goal status: INITIAL  3.  *** Baseline:  Goal status: INITIAL  4.  *** Baseline:  Goal status: INITIAL  5.  *** Baseline:  Goal status: INITIAL  6.  *** Baseline:  Goal status: INITIAL   ASSESSMENT:  CLINICAL IMPRESSION: Patient seen today for occupational therapy evaluation for ***.   PERFORMANCE DEFICITS: in functional skills including ADLs, IADLs, ROM, strength, pain, flexibility, decreased knowledge of use of DME, and UE functional use,   and psychosocial skills including environmental adaptation and routines and behaviors.   IMPAIRMENTS: are limiting patient from ADLs, IADLs, rest and sleep, play, leisure, and social participation.   COMORBIDITIES: has no other co-morbidities that affects occupational performance. Patient will benefit from skilled OT to address above impairments and improve overall function.  MODIFICATION OR ASSISTANCE TO COMPLETE EVALUATION: No modification of tasks or assist necessary to complete an evaluation.  OT OCCUPATIONAL PROFILE AND HISTORY: Problem focused assessment: Including review of records relating to presenting problem.  CLINICAL DECISION MAKING: LOW - limited treatment options, no task modification necessary  REHAB POTENTIAL: Good for goals  EVALUATION COMPLEXITY: Low   PLAN:  OT FREQUENCY: {rehab frequency:25116}  OT DURATION: {rehab duration:25117}  PLANNED INTERVENTIONS: {OT Interventions:25467}    CONSULTED AND AGREED WITH PLAN OF CARE: Patient    Ancel Deland NAMON LITTIE CARA 04/12/2024, 5:25 PM   "

## 2024-04-13 ENCOUNTER — Ambulatory Visit: Admitting: Licensed Clinical Social Worker

## 2024-04-13 ENCOUNTER — Ambulatory Visit: Admitting: Occupational Therapy

## 2024-04-18 ENCOUNTER — Ambulatory Visit: Payer: Self-pay | Admitting: Cardiology

## 2024-04-18 DIAGNOSIS — Z79899 Other long term (current) drug therapy: Secondary | ICD-10-CM

## 2024-04-18 DIAGNOSIS — I4819 Other persistent atrial fibrillation: Secondary | ICD-10-CM

## 2024-04-21 ENCOUNTER — Other Ambulatory Visit: Payer: Self-pay

## 2024-04-22 ENCOUNTER — Other Ambulatory Visit: Payer: Self-pay

## 2024-04-22 ENCOUNTER — Ambulatory Visit: Payer: Self-pay | Admitting: Pharmacist

## 2024-04-22 NOTE — Telephone Encounter (Signed)
 PAP: Patient assistance application for [employer]  has been approved by PAP Companies: [employer] from 04/16/2024 to 04/15/2025. Medication should be delivered to PAP Delivery: Home. For further shipping updates, please contact [employer] at [phone]. Patient ID is: not provided.

## 2024-04-23 NOTE — Progress Notes (Signed)
 Lisa Gutierrez                                          MRN: [id number]   04/23/2024   The [facility]BCI Quality Team Specialist reviewed this patient medical record for the purposes of chart review for care gap closure. The following were reviewed: chart review for care gap closure-kidney health evaluation for diabetes:eGFR  and uACR.    VBCI Quality Team

## 2024-04-24 NOTE — Telephone Encounter (Signed)
 Spoke with the patient who states that she saw Dr. Cindie on 12/22. She was having a lot of dizziness, weakness, and fatigue. He reduced her amiodarone  to 100 mg once daily. She started taking it at night time. She states that she felt somewhat better after this. She states that last night she checked her vitals and her heart rate was 80. She states that this morning she woke up and rechecked vitals and her heart rate was 100 with blood pressure 130/88. She is very anxious about this. She reports she is still dizzy and weak. She is concerned she may be back in afib. She has not rechecked her heart rate since this morning. Encouraged her to rest and recheck vitals in a bit.  Patient also reports that she does not have any motivation to do anything. She is tearful on the phone. Encouraged patient to reach out to her PCP. Advised to also discuss blood pressure concerns with PCP.

## 2024-04-27 ENCOUNTER — Ambulatory Visit (INDEPENDENT_AMBULATORY_CARE_PROVIDER_SITE_OTHER): Admitting: Licensed Clinical Social Worker

## 2024-04-27 DIAGNOSIS — F411 Generalized anxiety disorder: Secondary | ICD-10-CM

## 2024-04-27 DIAGNOSIS — F331 Major depressive disorder, recurrent, moderate: Secondary | ICD-10-CM | POA: Diagnosis not present

## 2024-04-27 DIAGNOSIS — F4310 Post-traumatic stress disorder, unspecified: Secondary | ICD-10-CM | POA: Diagnosis not present

## 2024-04-27 NOTE — Progress Notes (Incomplete)
 "  Primary Care Physician: Gayle Saddie FALCON, PA-C Primary Cardiologist: Deatrice Cage, MD Electrophysiologist: [name], MD (Inactive)  Referring Physician: Dr. Holts [place] Jakaria Lisa Gutierrez is a 74 y.o. female with a history of persistent AF, HTN, DM type II, obesity, anxiety, who presents for follow up in the [facility].  The patient was initially diagnosed with atrial fibrillation prior to cataract surgery in 2024 and underwent DCCV on 09/03/2022 with initiation of Eliquis .  2D echo was completed and showed LVEF of 50-55%, no regional wall motion abnormalities, no valvular abnormalities were noted.  She was referred to EP and evaluated by Dr. Holts on 02/27/2023 with recommendation of ablation. She underwent RF ablation procedure on 05/02/2023 and was seen in follow-up on 06/19/2023 with EKG showing sinus rhythm.  She was seen back in follow-up on 09/10/2023 and was in AF with RVR and was seen back in EP by Dr. Holts on 09/25/2023 with recommendation to begin Tikosyn .  She was admitted on 10/29/2023 and discharged on 11/01/2023 with Tikosyn  initiation.  She was seen back in the hospital for complaints of bodyaches, diarrhea and chills and was found to be back in atrial fibrillation with DCCV deferred at that time to allow for recovery of acute illness.  She was seen in follow-up on 12/20/2023 after failing to maintain sinus rhythm with dofetilide  and was loaded on amiodarone  with plan for repeat DCCV which was completed on 12/26/2023.  She was last seen by Dr. Holts on 04/06/2024 and was maintaining sinus rhythm with amiodarone  dose decreased to 100 mg daily due to experiencing frequent dizziness, weakness, and fatigue.  Today, she denies symptoms of ***palpitations, chest pain, shortness of breath, orthopnea, PND, lower extremity edema, dizziness, presyncope, syncope, snoring, daytime somnolence, bleeding, or neurologic sequela. The patient is tolerating medications  without difficulties and is otherwise without complaint today.    Notes: Recently experienced dizziness, weakness and fatigue on amiodarone  which was reduced to 100 mg daily Multaq? Return to EP to discuss ablation  Atrial Fibrillation Management history: Previous antiarrhythmic drugs: dofetilide , amiodarone  Previous cardioversions: 09/03/22, 06/03/23, 10/31/23, 11/15/23 Previous ablations: 05/02/23 Anticoagulation history: Eliquis   ROS- All systems are reviewed and negative except as per the HPI above.  Past Medical History:  Diagnosis Date   Anxiety    Atrial fibrillation (HCC)    Depression    Depression    Phreesia 07/02/2020   Diabetes mellitus without complication (HCC)    GERD (gastroesophageal reflux disease)    Hypertension    IBS (irritable bowel syndrome)    Neuromuscular disorder (HCC)     Current Outpatient Medications  Medication Sig Dispense Refill   acetaminophen  (TYLENOL ) 500 MG tablet Take 1,000 mg by mouth every 8 (eight) hours as needed for mild pain (pain score 1-3), moderate pain (pain score 4-6) or headache.     ALPRAZolam  (XANAX ) 0.5 MG tablet Take 1 tablet (0.5 mg total) by mouth at bedtime as needed. 30 tablet 0   amiodarone  (PACERONE ) 100 MG tablet Take 1 tablet (100 mg total) by mouth daily. 90 tablet 3   apixaban  (ELIQUIS ) 5 MG TABS tablet Take 1 tablet (5 mg total) by mouth 2 (two) times daily. 60 tablet 5   busPIRone  (BUSPAR ) 10 MG tablet Take 1 tablet (10 mg total) by mouth 2 (two) times daily. 180 tablet 2   Calcium  Carb-Cholecalciferol  (CALCIUM  600/VITAMIN D3) 600-20 MG-MCG TABS Take 1 tablet by mouth daily.     Cholecalciferol  (VITAMIN D3) 50  MCG (2000 UT) capsule Take 2,000 Units by mouth daily.     clobetasol  ointment (TEMOVATE ) 0.05 % Apply twice daily to affected areas on hands until improved then as needed for flares. 30 g 2   Continuous Glucose Sensor (FREESTYLE LIBRE 3 PLUS SENSOR) MISC Change sensor every 15 days. 1 each 2   digoxin   (LANOXIN ) 0.25 MG tablet Take 1 tablet (250 mcg total) by mouth daily. 90 tablet 2   docusate sodium  (STOOL SOFTENER) 100 MG capsule Take 1 capsule (100 mg total) by mouth 2 (two) times daily as needed. 60 capsule 2   empagliflozin  (JARDIANCE ) 25 MG TABS tablet Take 1 tablet (25 mg total) by mouth daily before breakfast. 90 tablet 1   ezetimibe  (ZETIA ) 10 MG tablet Take 1 tablet (10 mg total) by mouth daily. 90 tablet 3   Flaxseed, Linseed, (FLAXSEED OIL) 1400 MG CAPS Take 1,400 mg by mouth 2 (two) times daily.     furosemide  (LASIX ) 20 MG tablet Take 1 tablet (20 mg total) by mouth daily. (Patient taking differently: Take 20 mg by mouth daily as needed.) 30 tablet 6   hydrocortisone  (ANUSOL -HC) 25 MG suppository Place 1 suppository (25 mg total) rectally 2 (two) times daily. (Patient taking differently: Place 25 mg rectally 2 (two) times daily as needed for hemorrhoids.) 12 suppository 0   linaclotide  (LINZESS ) 290 MCG CAPS capsule Take 1 capsule (290 mcg total) by mouth daily before breakfast. 30 capsule 2   losartan  (COZAAR ) 25 MG tablet Take 1 tablet (25 mg total) by mouth daily. 90 tablet 3   magnesium  oxide (MAG-OX) 400 MG tablet Take 1 tablet (400 mg total) by mouth daily. 30 tablet 6   metFORMIN  (GLUCOPHAGE -XR) 500 MG 24 hr tablet Take 1 tablet (500 mg total) by mouth 2 (two) times daily with food. 60 tablet 1   metoprolol  tartrate (LOPRESSOR ) 50 MG tablet Take 1 tablet (50 mg total) by mouth 2 (two) times daily. 180 tablet 3   Multiple Vitamins-Minerals (MULTIVITAMIN ADULT PO) Take 1 tablet by mouth daily. One a day     ONETOUCH ULTRA test strip USE AS INSTRUCTED 100 strip 12   pantoprazole  (PROTONIX ) 40 MG tablet Take 1 tablet (40 mg total) by mouth 2 (two) times daily. (Patient taking differently: Take 40 mg by mouth as needed.) 180 tablet 1   potassium chloride  SA (KLOR-CON  M) 20 MEQ tablet Take 1 tablet (20 mEq total) by mouth daily. 30 tablet 6   Semaglutide  (RYBELSUS ) 14 MG TABS Take 1  tablet (14 mg total) by mouth daily. Do not cut, crush, or chew. 90 tablet 3   venlafaxine  XR (EFFEXOR  XR) 75 MG 24 hr capsule Take 1 capsule (75 mg total) by mouth daily with breakfast. 90 capsule 1   venlafaxine  XR (EFFEXOR -XR) 150 MG 24 hr capsule Take 1 capsule (150 mg total) by mouth daily with breakfast. 90 capsule 0   No current facility-administered medications for this visit.    Physical Exam: There were no vitals taken for this visit.  GEN: Well nourished, well developed in no acute distress NECK: No JVD; No carotid bruits CARDIAC: {EPRHYTHM:28826}, no murmurs, rubs, gallops RESPIRATORY:  Clear to auscultation without rales, wheezing or rhonchi  ABDOMEN: Soft, non-tender, non-distended EXTREMITIES:  No edema; No deformity   Wt Readings from Last 3 Encounters:  04/06/24 92.6 kg  03/05/24 92.1 kg  01/28/24 93.2 kg     EKG today demonstrates:   EKG Interpretation Date/Time:    Ventricular Rate:  PR Interval:    QRS Duration:    QT Interval:    QTC Calculation:   R Axis:      Text Interpretation:          Echo Completed 07/10/2022:  1. Left ventricular ejection fraction, by estimation, is 50 to 55%. The  left ventricle has low normal function. The left ventricle has no regional  wall motion abnormalities. There is mild left ventricular hypertrophy.  Left ventricular diastolic parameters are indeterminate.   2. Right ventricular systolic function is normal. The right ventricular  size is normal. Tricuspid regurgitation signal is inadequate for assessing  PA pressure.   3. Left atrial size was moderately dilated.   4. The mitral valve is normal in structure. No evidence of mitral valve  regurgitation. No evidence of mitral stenosis.   5. The aortic valve is normal in structure. Aortic valve regurgitation is  not visualized. No aortic stenosis is present.    CHA2DS2-VASc Score = 4  The patient's score is based upon: CHF History: 0 HTN History: 1 Diabetes  History: 1 Stroke History: 0 Vascular Disease History: 0 Age Score: 1 Gender Score: 1   {Confirm score is correct.  If not, click here to update score.  REFRESH note.  :1}    ASSESSMENT AND PLAN: Persistent Atrial Fibrillation (ICD10:  I48.19) The patient's CHA2DS2-VASc score is 4, indicating a 4.8% annual risk of stroke.    -s/p AF ablation on 04/2023 with recurrence and subsequent DCCV last completed on 12/26/2023.  Patient failed dofetilide  and is currently on amiodarone  with frequent dizziness, weakness, and fatigue noted at follow-up in dose decreased to 100 mg daily -Today patient reports*** - Continue metoprolol  50 mg twice daily and digoxin  0.25 mg - Continue Eliquis  5 mg twice daily  HTN:  Secondary Hypercoagulable State (ICD10:  D68.69){Click to add to Prob List or Visit Dx  :789639253} The patient is at significant risk for stroke/thromboembolism based upon her CHA2DS2-VASc Score of 4.  {Anticoag or No Anticoag  :7896394838}   High Risk Medication Monitoring (ICD 10: Z79.899) Patient requires ongoing monitoring for anti-arrhythmic medication which has the potential to cause life threatening arrhythmias. {medmonitoring:32394}    Signed,  Wyn Raddle, Jackee Shove, NP    04/27/2024 2:57 PM     {Are you ordering a CV Procedure (e.g. stress test, cath, DCCV, TEE, etc)?   Press F2        :[id number]}  Do not forget to refresh clinic note for EKG  Follow up with the AF Clinic in ***  "

## 2024-04-27 NOTE — Progress Notes (Signed)
 "  THERAPIST PROGRESS NOTE   Virtual Visit via Video Note  I connected with Lisa Gutierrez on 04/27/2024 at 10:00 AM EST by a video enabled telemedicine application and verified that I am speaking with the correct person using two identifiers.  Location: Patient: Address on file  Provider: Providers address   I discussed the limitations of evaluation and management by telemedicine and the availability of in person appointments. The patient expressed understanding and agreed to proceed.    I discussed the assessment and treatment plan with the patient. The patient was provided an opportunity to ask questions and all were answered. The patient agreed with the plan and demonstrated an understanding of the instructions.   The patient was advised to call back or seek an in-person evaluation if the symptoms worsen or if the condition fails to improve as anticipated.  I provided 52 minutes of non-face-to-face time during this encounter.   Lisa Gutierrez Husband, LCSW   Session Time: 10-10:52  am  Participation Level: Active   Behavioral Response: CasualAlertDepressed   Type of Therapy: Individual Therapy  Treatment Goals addressed:  Active     OP Depression     LTG: Reduce frequency, intensity, and duration of depression symptoms so that daily functioning is improved     Start:  02/24/24    Expected End:  05/26/24         LTG: Increase coping skills to manage depression and improve ability to perform daily activities     Start:  02/24/24    Expected End:  05/26/24         STG: Improve independence: I would like to go back to church.     Start:  02/24/24    Expected End:  05/26/24         STG - Other Self-Esteem Goal (Specify)     Start:  02/24/24    Expected End:  05/26/24      Improve self esteem       Work with Jon to identify the major components of a recent episode of depression: physical symptoms, major thoughts and images, and major behaviors they  experienced     Start:  02/24/24         Therapist will educate patient on cognitive distortions and the rationale for treatment of depression     Start:  02/24/24         Coping Skills      Start:  02/24/24       Will work with the pt using CBT/DBT techniques to help the pt verbalize an understanding of the cognitive, physiological, and behavioral components of depression and its treatment. This will be done by using worksheets, interactive activities, CBT/ABC thought logs, modeling, homework, role playing and journaling. Will work with pt to learn and implement coping skills that result in a reduction of depression and improve daily functioning per pt self-report 3 out of 5 documented sessions.          ProgressTowards Goals: Progressing  Interventions: CBT, Supportive, and Reframing  Summary: Lisa Gutierrez is a 74 y.o. female who presents with Change in energy/activity; Weight gain/loss; Worthlessness; Difficulty Concentrating; Fatigue; Hopelessness; Increase/decrease in appetite; Irritability; Sleep (too much or little); Tearfulness; Worrying; Sleep; Restlessness; Fatigue; Difficulty concentrating;  Avoids reminders of event; Detachment from others; Emotional numbing; Guilt/shame; Re-experience of traumatic event; Difficulty staying/falling asleep; Irritability/anger. Pt was oriented times 5. Pt was cooperative and engaged. Pt denies SI/HI/AVH.   The patient reflected on recent  changes in her health, which have left her feeling weak and tired. She expressed uncertainty about the future, saying, I don't know what's going to come next. This lack of clarity has contributed to feelings of defeat, particularly due to her experience with atrial fibrillation (AFIB) and its impact on her mood. She mentioned that she doesnt talk to her children as much as she would like, which breaks her heart and drains her energy and motivation.  She reported that she was contacted about  joining a Partial Hospitalization Program (PHP) group but did not respond because she states she does not want others to know what is happening in her life.  The patient also reflected on aspects of her life that she can control. She has been making arrangements to improve her home and expressed her desire to take small steps to enhance her circumstances, with the goal of engaging in her hobbies. She shared that her faith is an important source of strength during low moods.  Additionally, she reflected on her recent wedding anniversary and mentioned that she and her husband are doing better. This improvement with communication has encouraged them to work on reconnecting.  Continued to challenge the belief system that she is a failure.   Suicidal/Homicidal: Nowithout intent/plan   Therapist Response: Clinician utilized active and supportive reflection to create a safe space for patient to process recent life events. Assessed for safety, stressors, symptoms since last session. Used Cbt to challenge the belief she is a failure. Explored controllable factors due to her physical decline.   In future sessions, the clinician will educate the patient about cognitive distortions.   Signed TP due to meeting being virtual.    Plan: Return again in 2 weeks.   Diagnosis: PTSD (post-traumatic stress disorder)   MDD (major depressive disorder), recurrent episode, moderate (HCC)   GAD (generalized anxiety disorder)     Collaboration of Care: AEB psychiatrist can access notes and cln. Will review psychiatrists' notes. Check in with the patient and will see LCSW per availability. Patient agreed with treatment recommendations.   Patient/Guardian was advised Release of Information must be obtained prior to any record release in order to collaborate their care with an outside provider. Patient/Guardian was advised if they have not already done so to contact the registration department to sign all necessary  forms in order for us  to release information regarding their care.   Consent: Patient/Guardian gives verbal consent for treatment and assignment of benefits for services provided during this visit. Patient/Guardian expressed understanding and agreed to proceed.   Lisa Gutierrez Husband, LCSW 04/27/2024  "

## 2024-04-28 ENCOUNTER — Ambulatory Visit (HOSPITAL_COMMUNITY): Admitting: Nurse Practitioner

## 2024-05-01 ENCOUNTER — Other Ambulatory Visit: Payer: Self-pay

## 2024-05-01 ENCOUNTER — Other Ambulatory Visit (HOSPITAL_COMMUNITY): Payer: Self-pay

## 2024-05-01 DIAGNOSIS — E1165 Type 2 diabetes mellitus with hyperglycemia: Secondary | ICD-10-CM

## 2024-05-01 DIAGNOSIS — F411 Generalized anxiety disorder: Secondary | ICD-10-CM

## 2024-05-03 ENCOUNTER — Other Ambulatory Visit: Payer: Self-pay

## 2024-05-03 ENCOUNTER — Other Ambulatory Visit (HOSPITAL_COMMUNITY): Payer: Self-pay

## 2024-05-03 DIAGNOSIS — E1165 Type 2 diabetes mellitus with hyperglycemia: Secondary | ICD-10-CM

## 2024-05-03 MED ORDER — GLUCOSE BLOOD VI STRP
1.0000 | ORAL_STRIP | 12 refills | Status: DC
  Filled 2024-05-03 – 2024-05-04 (×2): qty 100, 33d supply, fill #0
  Filled 2024-05-05: qty 100, 30d supply, fill #0
  Filled 2024-05-06: qty 100, 25d supply, fill #0
  Filled 2024-05-06: qty 100, 33d supply, fill #0
  Filled 2024-05-06: qty 100, 25d supply, fill #0
  Filled ????-??-??: fill #0

## 2024-05-03 MED ORDER — ALPRAZOLAM 0.5 MG PO TABS
0.5000 mg | ORAL_TABLET | Freq: Every evening | ORAL | 0 refills | Status: AC | PRN
  Filled 2024-05-03: qty 30, 30d supply, fill #0

## 2024-05-04 ENCOUNTER — Other Ambulatory Visit: Payer: Self-pay

## 2024-05-04 ENCOUNTER — Other Ambulatory Visit (HOSPITAL_COMMUNITY): Payer: Self-pay

## 2024-05-05 ENCOUNTER — Other Ambulatory Visit: Payer: Self-pay

## 2024-05-05 ENCOUNTER — Telehealth: Payer: Self-pay | Admitting: Cardiology

## 2024-05-05 NOTE — Telephone Encounter (Signed)
 I sent a secure chat to Lauren: Hi Lauren, hope you are having a good day. Question do you still have this request? You forgot to put the procedure on the request.

## 2024-05-05 NOTE — Telephone Encounter (Signed)
 Secure with Lauren: hi, yes I do. I did not forget, the order says for the patient to have a to be determined at [facility]. maybe they just forgot to put the actual procedure in addition to saying the date is to be determined   Me: that is weird they did not put a procedure down. I will ask Dorise Pereyra, FNP if he can help me ty.  Hi Bryan, can you help me with this pt please and let me know what the procedure is to be done   Lauren: thank you   Dorise Pereyra, FNP: Looks like a colonoscopy from what I can tell.   UPDATE: PROCEDURE COLONOSCOPY PER DORISE PEREYRA, FNP

## 2024-05-05 NOTE — Telephone Encounter (Signed)
"  ° °  Pre-operative Risk Assessment    Patient Name: Lisa Gutierrez  DOB: 04-11-1951 MRN: [id number]   Date of last office visit: 04/06/2024 Date of next office visit: 05/07/2024   Request for Surgical Clearance    Procedure:  TBD  Date of Surgery:  Clearance TBD                                Surgeon:  Not Indicated Surgeon's Group or Practice Name:  [facility] Phone number:  [phone] Fax number:  [phone]   Type of Clearance Requested:   - Pharmacy:  Hold Apixaban  (Eliquis )     Type of Anesthesia:  Not Indicated   Additional requests/questions:    SignedTinnie NOVAK Schools   05/05/2024, 12:17 PM   "

## 2024-05-06 ENCOUNTER — Other Ambulatory Visit: Payer: Self-pay

## 2024-05-06 ENCOUNTER — Other Ambulatory Visit (HOSPITAL_COMMUNITY): Payer: Self-pay

## 2024-05-06 ENCOUNTER — Other Ambulatory Visit: Payer: Self-pay | Admitting: Family Medicine

## 2024-05-06 DIAGNOSIS — E1165 Type 2 diabetes mellitus with hyperglycemia: Secondary | ICD-10-CM

## 2024-05-06 MED ORDER — ACCU-CHEK GUIDE W/DEVICE KIT
1.0000 | PACK | Freq: Four times a day (QID) | 1 refills | Status: AC
  Filled 2024-05-06: qty 1, 30d supply, fill #0

## 2024-05-06 MED ORDER — ONETOUCH ULTRA VI STRP
ORAL_STRIP | 12 refills | Status: DC
  Filled 2024-05-06: qty 100, 33d supply, fill #0

## 2024-05-06 MED ORDER — METFORMIN HCL ER 500 MG PO TB24
500.0000 mg | ORAL_TABLET | Freq: Two times a day (BID) | ORAL | 1 refills | Status: AC
  Filled 2024-05-06 – 2024-05-07 (×2): qty 60, 30d supply, fill #0

## 2024-05-06 MED ORDER — ACCU-CHEK SOFTCLIX LANCETS MISC
12 refills | Status: AC
  Filled 2024-05-06: qty 100, 25d supply, fill #0

## 2024-05-06 MED ORDER — ACCU-CHEK GUIDE TEST VI STRP
ORAL_STRIP | 12 refills | Status: AC
  Filled 2024-05-06: qty 100, 25d supply, fill #0

## 2024-05-07 ENCOUNTER — Other Ambulatory Visit: Payer: Self-pay

## 2024-05-07 ENCOUNTER — Other Ambulatory Visit (HOSPITAL_COMMUNITY): Payer: Self-pay

## 2024-05-07 ENCOUNTER — Encounter: Payer: Self-pay | Admitting: Cardiology

## 2024-05-07 ENCOUNTER — Ambulatory Visit: Attending: Cardiology | Admitting: Cardiology

## 2024-05-07 VITALS — BP 130/62 | HR 104 | Ht 65.0 in | Wt 205.0 lb

## 2024-05-07 DIAGNOSIS — I4819 Other persistent atrial fibrillation: Secondary | ICD-10-CM

## 2024-05-07 DIAGNOSIS — F411 Generalized anxiety disorder: Secondary | ICD-10-CM | POA: Diagnosis not present

## 2024-05-07 DIAGNOSIS — Z79899 Other long term (current) drug therapy: Secondary | ICD-10-CM

## 2024-05-07 DIAGNOSIS — E669 Obesity, unspecified: Secondary | ICD-10-CM | POA: Diagnosis not present

## 2024-05-07 DIAGNOSIS — E1165 Type 2 diabetes mellitus with hyperglycemia: Secondary | ICD-10-CM | POA: Diagnosis not present

## 2024-05-07 DIAGNOSIS — D6869 Other thrombophilia: Secondary | ICD-10-CM

## 2024-05-07 DIAGNOSIS — R0602 Shortness of breath: Secondary | ICD-10-CM | POA: Diagnosis not present

## 2024-05-07 DIAGNOSIS — I10 Essential (primary) hypertension: Secondary | ICD-10-CM

## 2024-05-07 MED ORDER — AMIODARONE HCL 200 MG PO TABS: ORAL_TABLET | ORAL | 1 refills | Status: AC

## 2024-05-07 NOTE — Patient Instructions (Signed)
 Medication Instructions:   Increase Amiodarone  to 200 MG one daily. After five days if you remain in atrial fibrillation then increase Amiodarone  to 200 MG twice daily.  *If you need a refill on your cardiac medications before your next appointment, please call your pharmacy*  Lab Work: No labs ordered today  If you have labs (blood work) drawn today and your tests are completely normal, you will receive your results only by: MyChart Message (if you have MyChart) OR A paper copy in the mail If you have any lab test that is abnormal or we need to change your treatment, we will call you to review the results.  Testing/Procedures: No test ordered today   Follow-Up: At [facility], you and your health needs are our priority.  As part of our continuing mission to provide you with exceptional heart care, our providers are all part of one team.  This team includes your primary Cardiologist (physician) and Advanced Practice Providers or APPs (Physician Assistants and Nurse Practitioners) who all work together to provide you with the care you need, when you need it.  Your next appointment:   8 week(s)  Provider:   You may see Deatrice Cage, MD or one of the following Advanced Practice Providers on your designated Care Team:   Lonni Meager, NP Lesley Maffucci, PA-C Bernardino Bring, PA-C Cadence Seaford, PA-C Tylene Lunch, NP Barnie Hila, NP   Follow up with Dr. Kennyth for first available.  F  We recommend signing up for the patient portal called MyChart.  Sign up information is provided on this After Visit Summary.  MyChart is used to connect with patients for Virtual Visits (Telemedicine).  Patients are able to view lab/test results, encounter notes, upcoming appointments, etc.  Non-urgent messages can be sent to your provider as well.   To learn more about what you can do with MyChart, go to [email or web address].

## 2024-05-07 NOTE — Progress Notes (Signed)
 " Cardiology Office Note   Date:  05/07/2024  ID:  Khali, Perella 07-12-1950, MRN [id number] PCP: [name]  [facility] Providers Cardiologist:  Deatrice Cage, MD Cardiology APP:  Gerard Frederick, NP  Electrophysiologist:  [name], MD (Inactive)     History of Present Illness Lisa Gutierrez is a 74 y.o. female with a past medical history of essential hypertension, chronic electrolyte abnormalities, GERD, IBS, chronic superficial gastritis without bleeding, fatty liver disease, type 2 diabetes, GAD, insomnia, chronic history of statin intolerance, morbid obesity, persistent atrial fibrillation status post multiple DCCV (08/2022, 06/03/2023, 10/31/2023, and 12/26/2023 and ablation (05/02/2023), and multiple medication failures (Tikosyn ), who is here today for follow-up after called the triage line concerned that she is back in atrial fibrillation.   She had previously presented for cataract surgery on 07/10/2022 was found to be in atrial fibrillation with RVR by anesthesia.  Heart rate was 1 70-1 9 past time and procedure was subsequently canceled.  Echocardiogram revealed an LVEF of 50-55%, no RWMA, no valvular abnormalities were noted.  She was started on metoprolol , dig, and apixaban  and subsequently discharged on 07/04/2022.  She followed up in clinic on July 16, 2022 where she been tolerating her apixaban  and beta-blockers without incident she was scheduled for cardioversion procedure after being on anticoagulant with 4 weeks uninterrupted.  She did previously undergone cardioversion that was successful for generalized vision she was noted to be back in atrial fibrillation.   She was last seen in clinic 11/30/2022 she continued to be tearful with complaints of being depressed, weak, fatigued, anxious, with multiple questions about her medications.  She was continued on apixaban  for CHA2DS2-VASc of at least 5 for stroke prophylaxis.  EKG revealed she was  still in atrial fibrillation.  She was apprehensive about repeat cardioversion she was referred to EP.  She was seen in clinic by EP on 02/27/2023 discussed treatment options for atrial fibrillation including antiarrhythmic drug therapy and ablation.  After discussing risk, recovery, and likelihood of success with the treatment strategy she had opted for the ablation procedure.  This was to be scheduled for 05/02/2023.[name]  She presented to the [facility] emergency department 1/23 with shortness of breath, hypoxic.  PCR swab was positive for RSV.  Chest x-ray concerning for pneumonia.  She continued to remain in sinus rhythm on presentation.  She was admitted overnight.  Since discharge she had an ongoing cough that improved with Mucinex .  She noticed a catching sensation in her chest and that she was back in A-fib.  She states she was diligent about taking her apixaban  twice daily and was scheduled for repeat cardioversion.  She was seen in clinic by EP on 05/30/2023 for routine 4-week post ablation appointment where she was in A-fib.  She was status post DCCV with return to sinus rhythm.  She was then followed up and was not aware of any A-fib episodes.  She does not radiate, or catching sensation in her checks.  Blood pressure has been well-controlled.  Upper respiratory infection with increased nasal drainage and some facial tenderness and cough.  She questions what medications that she can safely take at the time.  Continue to diligently take her apixaban  with no missed doses.  She was to continue to maintain sinus rhythm continue on metoprolol  75 mg twice daily and apixaban  5 mg twice daily.  For blood pressure she was continued on losartan  25 mg daily.  She was seen in clinic 07/24/23  states she been doing well from a cardiac perspective.  She continues to have occasional sensation which only she was unable to catch her breath.  States she been compliant with her current medication regimen.  She was sent for an  updated digoxin  level and was continued on her current medication regimen with no further testing that was ordered at that time.   She was seen by EP 08/21/2023.  She was doing well without cardiac complaints.  She brought in a BP and pulse log with blood pressure range 120/130 systolic.  Pulse was ranging 50-70.  She denies chest pain, chest pressure or palpitations.  No increased swelling.  She was continued on metoprolol  75 mg twice daily, losartan  25 mg daily and apixaban  5 mg twice daily.  There were no changes made to her medication regimen and no further testing that was ordered.  She was evaluated on 09/10/2023 in clinic after she contracted several times with elevated blood pressures and heart rates have increased fatigue and shortness of breath as well as palpitations.  She had recently undergone 2 successful cardioversions and then had an ablation procedure and was previously maintaining sinus rhythm at the beginning of the month her EP appointment.  She states that she had notified that she converted back into atrial fibrillation and all of her symptoms started again.  She stated that her anxiety was worse and she felt that difficult to differentiate between anxiety and her atrial fibrillation.  EKG was done and she was noted to be back in atrial fibrillation with rates of 120s to 130s.  She was being sent for updated labs and scheduled for an updated appointment with EP to discuss antiarrhythmics versus repeat ablation procedure with EP.  She was evaluated by Dr. Cindie on 09/25/2023 of and she was interested in avoiding another catheter ablation which was deemed reasonable.  She had opted for hospital admission and Tikosyn  initiation.  She was going to be admitted to the hospital 10/29/2023 5 to start Tikosyn  therapy.  She was hospitalized at [facility] 7/15/7/18 for persistent atrial fibrillation status post Tikosyn  loading.  She underwent successful direct-current cardioversion on  10/31/2023 by Dr. Bertrum which successfully restored sinus rhythm.  There were no apparent early complications noted.  Renal function and electrolytes were followed during her hospitalization.  Her QTc remained stable.  She was considered stable and was able to be discharged home on 11/01/2023 with 1 week follow-up.  She was admitted to [facility] 7/22 through 11/03/2023 with atrial fibrillation with RVR that was likely triggered by recent upper respiratory infection.  She was restarted on her Tikosyn , beta-blocker and digoxin  as well as apixaban .  Recommended with outpatient cardioversion.  Chest x-ray did not show any pneumonia blood cultures were negative and COVID and influenza were negative.  She was discharged home with outpatient follow-up in 1 week.  She was evaluated in A-fib clinic on 11/11/2023.  She was scheduled for a cardioversion procedure and continued on apixaban , Tikosyn , dig, and Lopressor .  Patient underwent elective cardioversion and was shocked 1 time at 200 J and converted to normal sinus rhythm.  Metoprolol  was reduced to 50 mg twice daily due to sinus bradycardia and 50 bpm status post procedure.  There were no immediate postoperative complications noted.  She followed up with EP status post cardioversion procedure status post 1 month of Tikosyn  visit as well.  EKG revealed atrial fibrillation with 118 bpm.  She was considered to have Tikosyn  failure.  Tikosyn  was  stopped and she was to start amiodarone  on Sunday 8/17 at 200 mg twice daily for 2 weeks and then 200 mg daily.  Borderline LFTs and TSH.  Apixaban  was continued at 5 mg twice daily.  She returned to A-fib clinic on 12/20/2023 continue to deny symptoms of palpitations chest pain shortness of breath orthopnea.  Continued on amiodarone , apixaban , digoxin , and Lopressor .  Sent for updated labs.  In the long-term she was considering repeat ablation.  Discussed rhythm control options and discussed repeat cardioversion procedure after load  on amiodarone .  She underwent direct-current cardioversion procedure on 12/26/2023 and shocked at 200 J and converted to sinus rhythm first-degree AV block.  There were no immediate postoperative complications noted.  She was seen in clinic 01/28/2024 at that time she was tearful depressed and confused about her medications.  She just recently had COVID and continues to endorse extreme fatigue and brain fog.  She denies any chest pain, palpitations or shortness of breath.  EKG revealed she had remained in sinus rhythm.  She was continued on her current medication regimen without any changes made and no further testing was ordered at the time.  She was seen in clinic 03/06/2019 follow-up for and much better spirits.  She continued to have brain fog and fatigue after her fourth episode of contracting COVID.  She was encouraged to maintain follow-up with the EP.  There were no other medication changes that were made or further testing that was ordered.  She was last seen in clinic 04/06/2024 by Dr. Cindie.  At that time she was doing well reports some gait instability and some agitation caused by some of her other medications.  Amiodarone  was decreased to 100 mg daily as she was maintaining sinus rhythm after September cardioversion.  She was sent for updated labs but there were no further testing that was ordered at that time.   She returns to clinic today with worsening symptoms of shortness of breath, palpitations, fatigue, and lightheadedness.  She states that she had noted she was back in atrial fibrillation.  She denies any chest pain or chest pressure or peripheral edema.  She states that pharmacy continues to do her pill pack medications so that she is ensuring that she is taking her medications as prescribed.  She states she has not missed any doses of apixaban .  Denies any bleeding with no blood noted in her urine or stool.  She denies any hospitalizations or visits to the emergency  department.  ROS: 10 point review of systems has been reviewed and considered negative exception was been listed in the HPI  Studies Reviewed EKG Interpretation Date/Time:  Thursday May 07 2024 10:42:53 EST Ventricular Rate:  104 PR Interval:    QRS Duration:  88 QT Interval:  354 QTC Calculation: 465 R Axis:   -42  Text Interpretation: Atrial fibrillation with rapid ventricular response Left axis deviation Nonspecific ST and T wave abnormality When compared with ECG of 06-Apr-2024 12:49, Confirmed by Gerard Frederick ([id number]) on 05/07/2024 10:48:16 AM    Cardiac CT, 04/18/2023 1. There is normal pulmonary vein drainage into the left atrium. (2 on the right and 2 on the left) with ostial measurements as above.  2. The left atrial appendage is a Windsock-cactus type with ostial size 29 x 23 mm and length 31 mm, Area 49 mm2. There is no thrombus in the left atrial appendage.  3. The esophagus runs in the left atrial midline and is not in the proximity to  any of the pulmonary veins.  4. Coronary calcium  score of 331. This was 85th percentile for age/gender.   TTE, 07/11/2022  1. Left ventricular ejection fraction, by estimation, is 50 to 55%. The left ventricle has low normal function. The left ventricle has no regional wall motion abnormalities. There is mild left ventricular hypertrophy. Left ventricular diastolic  parameters are indeterminate.   2. Right ventricular systolic function is normal. The right ventricular size is normal. Tricuspid regurgitation signal is inadequate for assessing PA pressure.   3. Left atrial size was moderately dilated.   4. The mitral valve is normal in structure. No evidence of mitral valve regurgitation. No evidence of mitral stenosis.   5. The aortic valve is normal in structure. Aortic valve regurgitation is not visualized. No aortic stenosis is present.  Risk Assessment/Calculations  CHA2DS2-VASc Score = 4   This indicates a 4.8% annual risk of  stroke. The patient's score is based upon: CHF History: 0 HTN History: 1 Diabetes History: 1 Stroke History: 0 Vascular Disease History: 0 Age Score: 1 Gender Score: 1            Physical Exam VS:  BP 130/62 (BP Location: Left Arm, Patient Position: Sitting, Cuff Size: Large)   Pulse (!) 104   Ht 5' 5 (1.651 m)   Wt 205 lb (93 kg)   SpO2 98%   BMI 34.11 kg/m        Wt Readings from Last 3 Encounters:  05/07/24 205 lb (93 kg)  04/06/24 204 lb 4 oz (92.6 kg)  03/05/24 203 lb (92.1 kg)    GEN: Well nourished, well developed in no acute distress NECK: No JVD; No carotid bruits CARDIAC: IR IR, no murmurs, rubs, gallops RESPIRATORY:  Clear to auscultation without rales, wheezing or rhonchi  ABDOMEN: Soft, non-tender, non-distended EXTREMITIES:  No edema; No deformity   ASSESSMENT AND PLAN Persistent atrial fibrillation status post multiple DCCV and ablation procedure has failed Tikosyn  and now currently on amiodarone .  Unfortunately she returns today with palpitations, worsening shortness of breath, and fatigue and is found to be back in atrial fibrillation on EKG.  EKG today reveals rate controlled atrial fibrillation with left axis deviation and no further ischemic changes noted.  She is continued on amiodarone  with increasing the dose back to 200 mg daily for 5 days and then increasing back to twice daily dose as long as she tolerates from a GI standpoint she was having issues previously and her medication was decreased to 100 mg daily, metoprolol  to tartrate 50 mg twice daily, digoxin  0.25 mg daily, apixaban  5 mg twice daily for CHA2DS2-VASc or at least 4 for stroke prophylaxis.  Secondary hypercoagulable state related to atrial fibrillation on oral anticoagulant therapy.  She has been encouraged to follow-up with EP as with her last visit with Dr. Cindie it was recommended repeat ablation procedure.  Primary hypertension with blood pressure today 130/62.  Blood pressure remains  well-controlled.  She did bring her blood pressure log today with her that shows well-regulated blood pressures.  She is continued on her current medication regimen of losartan  25 mg daily and metoprolol  titrate 50 mg twice daily.  She has been encouraged to continue to monitor her pressures 1 to 2 hours postmedication administration at home as well.  Mixed hyperlipidemia last LDL 93.  She has been continued on ezetimibe  10 mg daily, ongoing management per PCP.  Type 2 diabetes where she is continued on Rybelsus , metformin , Jardiance  with ongoing management  of diabetes per PCP.  Generalized anxiety disorder which she continues to be treated by psychiatry and her counselor.  Obesity with a BMI of 34.11 where she would benefit from weight loss but has been under a lot of stress.       Dispo: Patient to return to clinic to see MD/APP in 8 weeks or sooner if needed for further evaluation.  Signed, Fannie Gathright, NP   "

## 2024-05-08 ENCOUNTER — Other Ambulatory Visit (HOSPITAL_COMMUNITY): Payer: Self-pay

## 2024-05-08 ENCOUNTER — Other Ambulatory Visit: Payer: Self-pay

## 2024-05-09 ENCOUNTER — Other Ambulatory Visit (HOSPITAL_BASED_OUTPATIENT_CLINIC_OR_DEPARTMENT_OTHER): Payer: Self-pay

## 2024-05-09 ENCOUNTER — Other Ambulatory Visit (HOSPITAL_COMMUNITY): Payer: Self-pay

## 2024-05-11 ENCOUNTER — Ambulatory Visit: Admitting: Licensed Clinical Social Worker

## 2024-05-11 NOTE — Telephone Encounter (Signed)
 Patient with diagnosis of atrial fibrillation on Eliquis  for anticoagulation.    Procedure: colonoscopy Date of procedure: TBD   CHA2DS2-VASc Score = 4   This indicates a 4.8% annual risk of stroke. The patient's score is based upon: CHF History: 0 HTN History: 1 Diabetes History: 1 Stroke History: 0 Vascular Disease History: 0 Age Score: 1 Gender Score: 1   CrCl 108 Platelet count 235  Patient  has not had an Afib/aflutter ablation in the last 3 months, DCCV within the last 4 weeks or a watchman implanted in the last 45 days   Per office protocol, patient can hold Eliquis  for 2 days prior to procedure.   Patient will not need bridging with Lovenox  (enoxaparin ) around procedure.  **This guidance is not considered finalized until pre-operative APP has relayed final recommendations.**

## 2024-05-12 NOTE — Telephone Encounter (Signed)
 Pt seen by Tylene Lunch, NP 05/07/24. Will fwd note to her to see if clearance can be provided based upon that visit. Glendia Ferrier, PA-C    05/12/2024 4:58 PM

## 2024-05-13 NOTE — Telephone Encounter (Signed)
"  ° °  Patient Name: Lisa Gutierrez  DOB: [birth year] MRN: [id number]  Primary Cardiologist: Deatrice Cage, MD  Chart reviewed as part of pre-operative protocol coverage. Patient was recently seen by Tylene Lunch, NP, on 05/07/2024. Per Ms. Hammock, patient can proceed with her procedure at acceptable risk.   Per pharmacy and office protocol, patient can hold Eliquis  for 2 days prior to procedure. Patient will not need bridging with Lovenox  (enoxaparin ) around procedure.  I will route this recommendation to the requesting party via Epic fax function and remove from pre-op pool.  Please call with questions.  Logan Vegh E Aizik Reh, PA-C 05/13/2024, 1:41 PM  "

## 2024-05-15 ENCOUNTER — Other Ambulatory Visit: Payer: Self-pay

## 2024-05-15 NOTE — Telephone Encounter (Signed)
 Recommend taking the amiodarone  twice daily.  200 mg in a.m. and 200 mg in the p.m.

## 2024-05-18 ENCOUNTER — Other Ambulatory Visit: Payer: Self-pay

## 2024-05-20 ENCOUNTER — Ambulatory Visit: Payer: Self-pay | Admitting: Pharmacist

## 2024-05-20 ENCOUNTER — Ambulatory Visit

## 2024-05-25 ENCOUNTER — Ambulatory Visit: Admitting: Licensed Clinical Social Worker

## 2024-05-26 ENCOUNTER — Ambulatory Visit: Admitting: Cardiology

## 2024-06-18 ENCOUNTER — Ambulatory Visit

## 2024-07-09 ENCOUNTER — Ambulatory Visit: Admitting: Cardiology

## 2024-08-11 ENCOUNTER — Ambulatory Visit: Admitting: Cardiology
